# Patient Record
Sex: Female | Born: 1960 | Race: White | Hispanic: No | Smoking: Never smoker
Health system: Southern US, Community
[De-identification: ages and names within clinical notes are randomized; demographics above are authoritative.]

## PROBLEM LIST (undated history)

## (undated) DIAGNOSIS — E669 Obesity, unspecified: Secondary | ICD-10-CM

## (undated) DIAGNOSIS — R51 Headache: Secondary | ICD-10-CM

## (undated) DIAGNOSIS — I2510 Atherosclerotic heart disease of native coronary artery without angina pectoris: Secondary | ICD-10-CM

## (undated) DIAGNOSIS — E039 Hypothyroidism, unspecified: Secondary | ICD-10-CM

## (undated) DIAGNOSIS — G629 Polyneuropathy, unspecified: Secondary | ICD-10-CM

## (undated) DIAGNOSIS — Z9989 Dependence on other enabling machines and devices: Principal | ICD-10-CM

## (undated) DIAGNOSIS — I8393 Asymptomatic varicose veins of bilateral lower extremities: Secondary | ICD-10-CM

## (undated) DIAGNOSIS — I10 Essential (primary) hypertension: Secondary | ICD-10-CM

## (undated) DIAGNOSIS — Z8679 Personal history of other diseases of the circulatory system: Secondary | ICD-10-CM

## (undated) DIAGNOSIS — R519 Headache, unspecified: Secondary | ICD-10-CM

## (undated) DIAGNOSIS — G8929 Other chronic pain: Secondary | ICD-10-CM

## (undated) DIAGNOSIS — G4733 Obstructive sleep apnea (adult) (pediatric): Secondary | ICD-10-CM

## (undated) DIAGNOSIS — D649 Anemia, unspecified: Secondary | ICD-10-CM

## (undated) DIAGNOSIS — J80 Acute respiratory distress syndrome: Secondary | ICD-10-CM

## (undated) DIAGNOSIS — G4730 Sleep apnea, unspecified: Secondary | ICD-10-CM

## (undated) DIAGNOSIS — H3500 Unspecified background retinopathy: Secondary | ICD-10-CM

## (undated) DIAGNOSIS — E119 Type 2 diabetes mellitus without complications: Secondary | ICD-10-CM

## (undated) DIAGNOSIS — G709 Myoneural disorder, unspecified: Secondary | ICD-10-CM

## (undated) DIAGNOSIS — N289 Disorder of kidney and ureter, unspecified: Secondary | ICD-10-CM

## (undated) DIAGNOSIS — Z9289 Personal history of other medical treatment: Secondary | ICD-10-CM

## (undated) DIAGNOSIS — I671 Cerebral aneurysm, nonruptured: Secondary | ICD-10-CM

## (undated) HISTORY — DX: Acute respiratory distress syndrome: J80

## (undated) HISTORY — DX: Asymptomatic varicose veins of bilateral lower extremities: I83.93

## (undated) HISTORY — DX: Sleep apnea, unspecified: G47.30

## (undated) HISTORY — DX: Essential (primary) hypertension: I10

## (undated) HISTORY — DX: Hypothyroidism, unspecified: E03.9

## (undated) HISTORY — DX: Dependence on other enabling machines and devices: Z99.89

## (undated) HISTORY — PX: SPINAL CORD STIMULATOR IMPLANT: SHX2422

## (undated) HISTORY — DX: Headache: R51

## (undated) HISTORY — DX: Cerebral aneurysm, nonruptured: I67.1

## (undated) HISTORY — DX: Obstructive sleep apnea (adult) (pediatric): G47.33

## (undated) HISTORY — PX: BREAST EXCISIONAL BIOPSY: SUR124

## (undated) HISTORY — PX: FOOT SURGERY: SHX648

## (undated) HISTORY — DX: Type 2 diabetes mellitus without complications: E11.9

## (undated) HISTORY — PX: REPLACEMENT TOTAL KNEE: SUR1224

## (undated) HISTORY — DX: Atherosclerotic heart disease of native coronary artery without angina pectoris: I25.10

## (undated) HISTORY — DX: Headache, unspecified: R51.9

## (undated) HISTORY — DX: Unspecified background retinopathy: H35.00

## (undated) HISTORY — DX: Polyneuropathy, unspecified: G62.9

## (undated) HISTORY — DX: Obesity, unspecified: E66.9

## (undated) HISTORY — PX: LEG SURGERY: SHX1003

---

## 1992-07-31 HISTORY — PX: BREAST SURGERY: SHX581

## 2014-05-18 ENCOUNTER — Encounter: Payer: Medicare Other | Attending: "Endocrinology | Admitting: Nutrition

## 2014-05-18 ENCOUNTER — Encounter: Payer: Self-pay | Admitting: Nutrition

## 2014-05-18 VITALS — Ht 69.0 in

## 2014-05-18 DIAGNOSIS — E108 Type 1 diabetes mellitus with unspecified complications: Secondary | ICD-10-CM | POA: Insufficient documentation

## 2014-05-18 NOTE — Progress Notes (Signed)
  Medical Nutrition Therapy:  Appt start time: 0800 end time:  0830.   Assessment:  Primary concerns today: Type 1 Diabetes.  She states she only wants to know how many calories she is suppose to eat a day and how many carbs per meal. Refused to be weighed today. Doesn't want any further education.    Has had Type 1 for 32+ years. Most recent A1C was 8.8%. Lives with boyfriend. She does own shopping and cooking.Foods are baked, grilled or broiled. Limited physical activity due to Charcots foot and her back problems. Afraid of water and can't do water aerobics. Has recently moved here with her boyfriend from [place].  She notes she takes 8 units of Humalog at breakfast, 10 units lunch and 12 units at dinner. 43 units of Levemir at night.  FBS: 112 mg/dl this am. .   Hasn't lost any weight since she moved here to [place] three months ago.  Had A1C 13% back in December in 2014 in [place].  Preferred Learning Style:   Visual  Auditory  Learning Readiness:   Contemplating  Ready   MEDICATIONS:    DIETARY INTAKE:  24-hr recall:  B ( AM): Oatmeal, Poached egg or scrambled whites with toast and fruit  Coffee with equal. Snk ( AM): none  L ( PM): Toss salad with canned salmon or tunafish with dressing, crackers and fruit Snk ( PM): none D ( PM): 1 slice cheese pizza, muscardine grapes 10-15, 6 oz 2% milk. Snk ( PM):  Beverages: water and 2% milk  Usual physical activity: limited  Estimated energy needs: 1200 calories 135 g carbohydrates 90 g protein 33 g fat  Progress Towards Goal(s):  In progress.   Nutritional Diagnosis:  NB-1.1 Food and nutrition-related knowledge deficit As related to diabetes.  As evidenced by A1C 8.8%..    Intervention:  Nutrition counseling and diabetes education.. Plan:  Aim for 2-3 Carb Choices per meal (30-45 grams) +/- 1 either way  Avoid snacks between meal unless a low blood sugar. Include protein in moderation with your meals and  snacks Continue taking medications as directed by MD.  Teaching Method Utilized:  Auditory Visual  Handouts given during visit include:  Refused any education handouts  Barriers to learning/adherence to lifestyle change:   Demonstrated degree of understanding via:  Teach Back   Monitoring/Evaluation:  Dietary intake, exercise, meal planning,, and body weight prn.

## 2014-05-18 NOTE — Patient Instructions (Signed)
Plan:  Aim for 2-3 Carb Choices per meal (30-45 grams) +/- 1 either way  No snacking between meals unless a low blood sugar. Include protein in moderation with your meals and snacks Continue checking BG at alternate times per day as directed by MD  Continue taking medication as directed by MD

## 2014-08-03 ENCOUNTER — Encounter: Payer: Self-pay | Admitting: Neurology

## 2014-08-03 ENCOUNTER — Ambulatory Visit (INDEPENDENT_AMBULATORY_CARE_PROVIDER_SITE_OTHER): Payer: Medicare Other | Admitting: Neurology

## 2014-08-03 VITALS — BP 118/70 | HR 78 | Temp 98.0°F | Resp 18 | Ht 69.0 in | Wt 258.2 lb

## 2014-08-03 DIAGNOSIS — G4485 Primary stabbing headache: Secondary | ICD-10-CM

## 2014-08-03 MED ORDER — TOPIRAMATE 50 MG PO TABS: ORAL_TABLET | ORAL | Status: DC

## 2014-08-03 NOTE — Patient Instructions (Signed)
1.  Start topamax 50mg  tablet.  Take 1/2 tablet at bedtime for 7 days, then 1tablet at bedtime.  Call in 4 weeks with update and we can increase dose if needed. 2.  Stop verapamil 3. I would take the melatonin 10mg  if tolerating 4.  We will get results of prior CTA of the head and labs from your endocrinologist 5.  Follow up in 2 months.

## 2014-08-03 NOTE — Progress Notes (Signed)
NEUROLOGY CONSULTATION NOTE  Ariceli Stobaugh MRN: [id number] DOB: [birth year]  Referring provider: Dr. Manuella Ghazi Primary care provider: Dr. Manuella Ghazi  Reason for consult:  headache  HISTORY OF PRESENT ILLNESS: Jodi Ray is a 54 year old right-handed woman with chronic pain related to complex regional pain syndrome (with spinal stimulator), hypertension, type I diabetes mellitus with neuropathy, hypothyroidism, polymyalgia rheumatica, and morbid obesity who presents for headache.  Records and MRI reports reviewed.  Onset:  2011.  She was previously treated in [place], where she was diagnosed with primary stabbing headache. Location:  Right frontal region Quality:  stabbing Intensity:  10/10 Aura:  no Prodrome:  no Associated symptoms:  Some nausea if severe. No autonomic symptoms. Duration:  10 to 60 minutes Frequency:  4 times a week Triggers/exacerbating factors:  none Relieving factors:  none Activity:  Cannot function when experiencing it.  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  However, a repeat CTA performed on 02/22/12 did not reveal any aneurysm.  She reportedly had a follow up CTA in February 2015, which is not available to me.  She also has complex regional pain syndrome following an accident where she fell down the steps and crushed her right leg.  She takes gabapentin, tramadol and has a spinal nerve stimulator.  She takes  this for her painful diabetic neuropathy as well.  PAST MEDICAL HISTORY: Past Medical History  Diagnosis Date  . Diabetes mellitus without complication   . Hypothyroidism   . Obesity   . Adult RDS   . Retinopathy   . Headache     PAST SURGICAL HISTORY: Past Surgical History  Procedure Laterality Date  . Foot surgery    . Spinal cord stimulator implant    . Leg surgery Right     MEDICATIONS: Current Outpatient Prescriptions on File Prior to Visit  Medication Sig Dispense Refill  . aspirin EC 81 MG tablet Take 81 mg by mouth daily.    . baclofen (LIORESAL) 10 MG tablet Take 10 mg by mouth 3 (three) times daily.    . cholecalciferol (VITAMIN D) 400 UNITS TABS tablet Take 400 Units by mouth.    . gabapentin (NEURONTIN) 800 MG tablet Take 800 mg by mouth 3 (three) times daily.    . hydrochlorothiazide (MICROZIDE) 12.5 MG capsule Take 12.5 mg by mouth daily.    . hyoscyamine (LEVSIN) 0.125 MG/5ML ELIX Take 0.125 mg by mouth.    . indomethacin (INDOCIN) 25 MG capsule Take 25 mg by mouth 2 (two) times daily with a meal.    . insulin aspart (NOVOLOG) 100 UNIT/ML injection Inject into the skin 3 (three) times daily before meals.    . insulin detemir (LEVEMIR) 100 UNIT/ML injection Inject 43 Units into the skin at bedtime.     Marland Kitchen levothyroxine (SYNTHROID, LEVOTHROID) 88 MCG tablet Take 88 mcg by mouth daily before breakfast.    . lisinopril (PRINIVIL,ZESTRIL) 10 MG tablet Take 10 mg by mouth daily.    Marland Kitchen loratadine (CLARITIN)  10 MG tablet Take 10 mg by mouth daily.    Marland Kitchen omeprazole (PRILOSEC) 40 MG capsule Take 40 mg by mouth daily.    . traMADol (ULTRAM-ER) 100 MG 24 hr tablet Take 100 mg by mouth daily.    . triazolam (HALCION) 0.25 MG tablet Take 0.25 mg by mouth at bedtime as needed for sleep.    . metFORMIN (GLUCOPHAGE) 1000 MG tablet Take 1,000 mg by mouth 2 (two) times daily with a meal.     No current facility-administered medications on file prior to visit.     ALLERGIES: Allergies  Allergen Reactions  . Clonidine Derivatives     Coma    FAMILY HISTORY: Family History  Problem Relation Age of Onset  . Cancer Mother     colon  . Cancer Father     renal cell  . Cancer Brother     bladder  . Cancer Sister     brain   . Cancer Sister     breast     SOCIAL HISTORY: History   Social History  . Marital Status: Unknown    Spouse Name: N/A    Number of Children: N/A  . Years of Education: N/A   Occupational History  . Not on file.   Social History Main Topics  . Smoking status: Never Smoker   . Smokeless tobacco: Never Used  . Alcohol Use: No  . Drug Use: No  . Sexual Activity:    Partners: Male   Other Topics Concern  . Not on file   Social History Narrative    REVIEW OF SYSTEMS: Constitutional: No fevers, chills, or sweats, no generalized fatigue, change in appetite Eyes: No visual changes, double vision, eye pain Ear, nose and throat: No hearing loss, ear pain, nasal congestion, sore throat Cardiovascular: No chest pain, palpitations Respiratory:  No shortness of breath at rest or with exertion, wheezes GastrointestinaI: No nausea, vomiting, diarrhea, abdominal pain, fecal incontinence Genitourinary:  No dysuria, urinary retention or frequency Musculoskeletal:  No neck pain, back pain Integumentary: No rash, pruritus, skin lesions Neurological: as above Psychiatric: No depression, insomnia, anxiety Endocrine: No palpitations, fatigue, diaphoresis, mood swings, change in appetite, change in weight, increased thirst Hematologic/Lymphatic:  No anemia, purpura, petechiae. Allergic/Immunologic: no itchy/runny eyes, nasal congestion, recent allergic reactions, rashes  PHYSICAL EXAM: Filed Vitals:   08/03/14 0920  BP: 118/70  Pulse: 78  Temp: 98 F (36.7 C)  Resp: 18   General: No acute distress Head:  Normocephalic/atraumatic Eyes:  fundi unremarkable, without vessel changes, exudates, hemorrhages or  papilledema. Neck: supple, no paraspinal tenderness, full range of motion Back: No paraspinal tenderness Heart: regular rate and rhythm Lungs: Clear to auscultation bilaterally. Vascular: No carotid bruits. Neurological Exam: Mental status: alert and oriented to person, place, and time, recent and remote memory intact, fund of knowledge intact, attention and concentration intact, speech fluent and not dysarthric, language intact. Cranial nerves: CN I: not tested CN II: pupils equal, round and reactive to light, visual fields intact, fundi unremarkable, without vessel changes, exudates, hemorrhages or papilledema. CN III, IV, VI:  full range of motion, no nystagmus, no ptosis CN V: facial sensation intact CN VII: upper and lower face symmetric CN VIII: hearing intact CN IX, X: gag intact, uvula midline CN XI: sternocleidomastoid and trapezius muscles intact CN XII: tongue midline Bulk & Tone: normal, no fasciculations. Motor:  Right ankle dorsiflexion 4-/5.  Right knee extension and flexion at least 4+/5 (limited due to pain).  Otherwise  5/5 throughout Sensation:  Reduced pinprick in right foot and shin.  Reduced vibration in toes bilaterally. Deep Tendon Reflexes:  2+ throughout except absent in ankles.  Toes downgoing. Finger to nose testing:  No dysmetria Heel to shin:  No dysmetria Gait:  Right limp. Romberg negative.  IMPRESSION: Primary stabbing headache  PLAN: 1.  Will start topamax 25mg  and titrate to 50mg  at bedtime and discontinue verapamil. 2.  Recommend taking melatonin 10mg  every night if tolerating. 3.  Get results of prior CTA from last year, as well as recent labs 4.  Follow up in 2 months.  Thank you for allowing me to take part in the care of this patient.  Metta Clines, DO  CC: Monico Blitz, MD

## 2014-08-03 NOTE — Progress Notes (Deleted)
NEUROLOGY CONSULTATION NOTE  Jodi Ray MRN: [id number] DOB: ***  Referring provider: *** Primary care provider: ***  Reason for consult:  ***  HISTORY OF PRESENT ILLNESS: Jodi Ray is a 54 year old ***-handed woman with chronic pain related to spinal cord injury (with spinal stimulator), hypertension, type II diabetes mellitus with neuropathy, hypothyroidism and morbid obesity who presents for headache.  Records reviewed.  ***.  She takes gabapentin   Onset:  *** Location:  *** Quality:  *** Intensity:  *** Aura:  *** Prodrome:  *** Associated symptoms:  *** Duration:  *** Frequency:  *** Triggers/exacerbating factors:  *** Relieving factors:  *** Activity:  ***  Past abortive therapy:  *** Past preventative therapy:  ***  Current abortive therapy:  *** Current preventative therapy:  *** Other medications (for chronic pain and diabetic neuropathy):  gabapentin 300mg  three times daily, tramadol 50-100mg , baclofen Other medication (for BP):  verapamil 80mg  three times daily  Caffeine:  *** Alcohol:  *** Smoker:  *** Diet:  *** Exercise:  *** Depression/stress:  *** Sleep hygiene:  *** Family history of headache:  ***  PAST MEDICAL HISTORY: Past Medical History  Diagnosis Date  . Diabetes mellitus without complication   . Hypothyroidism   . Obesity   . Adult RDS   . Retinopathy   . Headache     PAST SURGICAL HISTORY: Past Surgical History  Procedure Laterality Date  . Foot surgery    . Spinal cord stimulator implant    . Leg surgery Right     MEDICATIONS: Current Outpatient Prescriptions on File Prior to Visit  Medication Sig Dispense Refill  . aspirin EC 81 MG tablet Take 81 mg by mouth daily.    . baclofen (LIORESAL) 10 MG tablet Take 10 mg by mouth 3 (three) times daily.    . cholecalciferol (VITAMIN D) 400 UNITS TABS tablet Take 400 Units by mouth.    . gabapentin (NEURONTIN) 800 MG tablet Take 800 mg by mouth 3 (three) times  daily.    . hydrochlorothiazide (MICROZIDE) 12.5 MG capsule Take 12.5 mg by mouth daily.    . hyoscyamine (LEVSIN) 0.125 MG/5ML ELIX Take 0.125 mg by mouth.    . indomethacin (INDOCIN) 25 MG capsule Take 25 mg by mouth 2 (two) times daily with a meal.    . insulin aspart (NOVOLOG) 100 UNIT/ML injection Inject into the skin 3 (three) times daily before meals.    . insulin detemir (LEVEMIR) 100 UNIT/ML injection Inject 43 Units into the skin at bedtime.     Marland Kitchen levothyroxine (SYNTHROID, LEVOTHROID) 88 MCG tablet Take 88 mcg by mouth daily before breakfast.    . lisinopril (PRINIVIL,ZESTRIL) 10 MG tablet Take 10 mg by mouth daily.    Marland Kitchen loratadine (CLARITIN) 10 MG tablet Take 10 mg by mouth daily.    Marland Kitchen omeprazole (PRILOSEC) 40 MG capsule Take 40 mg by mouth daily.    . traMADol (ULTRAM-ER) 100 MG 24 hr tablet Take 100 mg by mouth daily.    . triazolam (HALCION) 0.25 MG tablet Take 0.25 mg by mouth at bedtime as needed for sleep.    . metFORMIN (GLUCOPHAGE) 1000 MG tablet Take 1,000 mg by mouth 2 (two) times daily with a meal.     No current facility-administered medications on file prior to visit.    ALLERGIES: Allergies  Allergen Reactions  . Clonidine Derivatives     Coma    FAMILY HISTORY: Family History  Problem Relation Age of Onset  .  Cancer Mother     colon  . Cancer Father     renal cell  . Cancer Brother     bladder  . Cancer Sister     brain   . Cancer Sister     breast     SOCIAL HISTORY: History   Social History  . Marital Status: Unknown    Spouse Name: N/A    Number of Children: N/A  . Years of Education: N/A   Occupational History  . Not on file.   Social History Main Topics  . Smoking status: Never Smoker   . Smokeless tobacco: Never Used  . Alcohol Use: No  . Drug Use: No  . Sexual Activity:    Partners: Male   Other Topics Concern  . Not on file   Social History Narrative    REVIEW OF SYSTEMS: Constitutional: No fevers, chills, or sweats,  no generalized fatigue, change in appetite Eyes: No visual changes, double vision, eye pain Ear, nose and throat: No hearing loss, ear pain, nasal congestion, sore throat Cardiovascular: No chest pain, palpitations Respiratory:  No shortness of breath at rest or with exertion, wheezes GastrointestinaI: No nausea, vomiting, diarrhea, abdominal pain, fecal incontinence Genitourinary:  No dysuria, urinary retention or frequency Musculoskeletal:  No neck pain, back pain Integumentary: No rash, pruritus, skin lesions Neurological: as above Psychiatric: No depression, insomnia, anxiety Endocrine: No palpitations, fatigue, diaphoresis, mood swings, change in appetite, change in weight, increased thirst Hematologic/Lymphatic:  No anemia, purpura, petechiae. Allergic/Immunologic: no itchy/runny eyes, nasal congestion, recent allergic reactions, rashes  PHYSICAL EXAM: Filed Vitals:   08/03/14 0920  BP: 118/70  Pulse: 78  Temp: 98 F (36.7 C)  Resp: 18   General: No acute distress Head:  Normocephalic/atraumatic Eyes:  fundi unremarkable, without vessel changes, exudates, hemorrhages or papilledema. Neck: supple, no paraspinal tenderness, full range of motion Back: No paraspinal tenderness Heart: regular rate and rhythm Lungs: Clear to auscultation bilaterally. Vascular: No carotid bruits. Neurological Exam: Mental status: alert and oriented to person, place, and time, recent and remote memory intact, fund of knowledge intact, attention and concentration intact, speech fluent and not dysarthric, language intact. Cranial nerves: CN I: not tested CN II: pupils equal, round and reactive to light, visual fields intact, fundi unremarkable, without vessel changes, exudates, hemorrhages or papilledema. CN III, IV, VI:  full range of motion, no nystagmus, no ptosis CN V: facial sensation intact CN VII: upper and lower face symmetric CN VIII: hearing intact CN IX, X: gag intact, uvula  midline CN XI: sternocleidomastoid and trapezius muscles intact CN XII: tongue midline Bulk & Tone: normal, no fasciculations. Motor:  *** Sensation:  *** Deep Tendon Reflexes:  *** Finger to nose testing:  *** Heel to shin:  *** Gait:  ***. Romberg ***.  IMPRESSION: ***  PLAN: ***  Thank you for allowing me to take part in the care of this patient.  Metta Clines, DO  CC: ***

## 2014-09-07 ENCOUNTER — Other Ambulatory Visit: Payer: Self-pay | Admitting: Neurology

## 2014-10-02 ENCOUNTER — Other Ambulatory Visit: Payer: Self-pay | Admitting: *Deleted

## 2014-10-02 ENCOUNTER — Ambulatory Visit (INDEPENDENT_AMBULATORY_CARE_PROVIDER_SITE_OTHER): Payer: Medicare Other | Admitting: Neurology

## 2014-10-02 ENCOUNTER — Encounter: Payer: Self-pay | Admitting: Neurology

## 2014-10-02 VITALS — BP 138/62 | HR 80 | Temp 98.0°F | Resp 16 | Ht 69.0 in | Wt 249.9 lb

## 2014-10-02 DIAGNOSIS — G43009 Migraine without aura, not intractable, without status migrainosus: Secondary | ICD-10-CM

## 2014-10-02 DIAGNOSIS — G4485 Primary stabbing headache: Secondary | ICD-10-CM | POA: Insufficient documentation

## 2014-10-02 MED ORDER — TOPIRAMATE 50 MG PO TABS: 50.0000 mg | ORAL_TABLET | Freq: Every day | ORAL | Status: DC

## 2014-10-02 NOTE — Progress Notes (Signed)
NEUROLOGY FOLLOW UP OFFICE NOTE  Jodi Soliday [id number]  HISTORY OF PRESENT ILLNESS: Jodi Ray is a 54 year old right-handed woman with chronic pain related to complex regional pain syndrome (with spinal stimulator), hypertension, type I diabetes mellitus with neuropathy, hypothyroidism, polymyalgia rheumatica, and morbid obesity who follows up for primary stabbing headache.  UPDATE: Current preventative therapy:  Topiramate 50mg , Melatonin 10mg  nightly The stabbing headaches have been well controlled on this regimen.  Due to change in weather, she has been having what sound like mild migraine headaches for the past 3 days.  They are bi-frontal/maxillary and associated with slight nausea.  She has had this before when her allergies "act up."  She has been taking Tylenol daily.   HISTORY: Onset:  2011.  She was previously treated in [place], where she was diagnosed with primary stabbing headache. Location:  Right frontal region Quality:  stabbing Intensity:  10/10 Aura:  no Prodrome:  no Associated symptoms:  Some nausea if severe. No autonomic symptoms. Duration:  10 to 60 minutes Frequency:  4 times a week Triggers/exacerbating factors:  none Relieving factors:  none Activity:  Cannot function when experiencing it.  Past preventative therapy:  Indomethacin 25mg  three times daily (effective but discontinued due to elevated liver enzymes), Verapamil (ineffective)  In 2009, she had a CTA of the head which reportedly showed a 1-30mm aneurysm at the junction of right A1 and A2 segment of the ACA.  However, a repeat CTA performed on 02/22/12 did not reveal any aneurysm.  She reportedly had a follow up CTA in February 2015, which is not available to me.  She also has complex regional pain syndrome following an accident where she fell down the steps and crushed her right leg.  She takes gabapentin, tramadol and has a spinal nerve stimulator.  She takes this for her painful  diabetic neuropathy as well.  PAST MEDICAL HISTORY: Past Medical History  Diagnosis Date  . Diabetes mellitus without complication   . Hypothyroidism   . Obesity   . Adult RDS   . Retinopathy   . Headache     MEDICATIONS: Current Outpatient Prescriptions on File Prior to Visit  Medication Sig Dispense Refill  . aspirin EC 81 MG tablet Take 81 mg by mouth daily.    . baclofen (LIORESAL) 10 MG tablet Take 10 mg by mouth 3 (three) times daily.    . cholecalciferol (VITAMIN D) 400 UNITS TABS tablet Take 400 Units by mouth.    . gabapentin (NEURONTIN) 800 MG tablet Take 800 mg by mouth 3 (three) times daily.    . hydrochlorothiazide (MICROZIDE) 12.5 MG capsule Take 12.5 mg by mouth daily.    . hyoscyamine (LEVSIN) 0.125 MG/5ML ELIX Take 0.125 mg by mouth.    . indomethacin (INDOCIN) 25 MG capsule Take 25 mg by mouth 2 (two) times daily with a meal.    . insulin aspart (NOVOLOG) 100 UNIT/ML injection Inject into the skin 3 (three) times daily before meals.    Marland Kitchen levothyroxine (SYNTHROID, LEVOTHROID) 88 MCG tablet Take 88 mcg by mouth daily before breakfast.    . lisinopril (PRINIVIL,ZESTRIL) 10 MG tablet Take 10 mg by mouth daily.    Marland Kitchen loratadine (CLARITIN) 10 MG tablet Take 10 mg by mouth daily.    Marland Kitchen omeprazole (PRILOSEC) 40 MG capsule Take 40 mg by mouth daily.    Marland Kitchen topiramate (TOPAMAX) 50 MG tablet TAKE 1/2 TABLET AT BEDTIME FOR 7 DAYS THEN TAKE 1 TABLET AT BEDTIME  30 tablet 0  . traMADol (ULTRAM-ER) 100 MG 24 hr tablet Take 100 mg by mouth daily.    . triazolam (HALCION) 0.25 MG tablet Take 0.25 mg by mouth at bedtime as needed for sleep.    . insulin detemir (LEVEMIR) 100 UNIT/ML injection Inject 43 Units into the skin at bedtime.     . metFORMIN (GLUCOPHAGE) 1000 MG tablet Take 1,000 mg by mouth 2 (two) times daily with a meal.    . verapamil (CALAN) 80 MG tablet Take 80 mg by mouth once. BID if needed     No current facility-administered medications on file prior to visit.     ALLERGIES: Allergies  Allergen Reactions  . Clonidine Derivatives     Coma    FAMILY HISTORY: Family History  Problem Relation Age of Onset  . Cancer Mother     colon  . Cancer Father     renal cell  . Cancer Brother     bladder  . Cancer Sister     brain   . Cancer Sister     breast     SOCIAL HISTORY: History   Social History  . Marital Status: Unknown    Spouse Name: N/A  . Number of Children: N/A  . Years of Education: N/A   Occupational History  . Not on file.   Social History Main Topics  . Smoking status: Never Smoker   . Smokeless tobacco: Never Used  . Alcohol Use: No  . Drug Use: No  . Sexual Activity:    Partners: Male   Other Topics Concern  . Not on file   Social History Narrative    REVIEW OF SYSTEMS: Constitutional: No fevers, chills, or sweats, no generalized fatigue, change in appetite Eyes: No visual changes, double vision, eye pain Ear, nose and throat: No hearing loss, ear pain, nasal congestion, sore throat Cardiovascular: No chest pain, palpitations Respiratory:  No shortness of breath at rest or with exertion, wheezes GastrointestinaI: No nausea, vomiting, diarrhea, abdominal pain, fecal incontinence Genitourinary:  No dysuria, urinary retention or frequency Musculoskeletal:  No neck pain, back pain Integumentary: No rash, pruritus, skin lesions Neurological: as above Psychiatric: No depression, insomnia, anxiety Endocrine: No palpitations, fatigue, diaphoresis, mood swings, change in appetite, change in weight, increased thirst Hematologic/Lymphatic:  No anemia, purpura, petechiae. Allergic/Immunologic: no itchy/runny eyes, nasal congestion, recent allergic reactions, rashes  PHYSICAL EXAM: Filed Vitals:   10/02/14 0942  BP: 138/62  Pulse: 80  Temp: 98 F (36.7 C)  Resp: 16   General: No acute distress Head:  Normocephalic/atraumatic Eyes:  Fundoscopic exam unremarkable without vessel changes, exudates,  hemorrhages or papilledema. Neck: supple, no paraspinal tenderness, full range of motion Heart:  Regular rate and rhythm Lungs:  Clear to auscultation bilaterally Back: No paraspinal tenderness Neurological Exam: alert and oriented to person, place, and time. Attention span and concentration intact, recent and remote memory intact, fund of knowledge intact.  Speech fluent and not dysarthric, language intact.  CN II-XII intact. Fundoscopic exam unremarkable without vessel changes, exudates, hemorrhages or papilledema.  Bulk and tone normal, muscle strength 5/5 throughout.  Sensation to light touch, temperature and vibration intact.  Deep tendon reflexes 1+ throughout except absent in ankles.  Finger to nose testing intact.  Gait with right limp.  IMPRESSION: Primary stabbing headache, stable Migraine without aura  PLAN: 1. Continue topamax 50mg  and melatonin 10mg  daily 2.  Will monitor headache and take tylenol sparingly 3.  Follow up in 3 months.  Metta Clines, DO  CC:  Monico Blitz, MD

## 2014-10-02 NOTE — Patient Instructions (Signed)
Continue Topamax 50mg  Take Tylenol for the current headache but limit to no more than 2 days out of the week Follow up in 3 months.  Call sooner with questions or concerns.

## 2015-01-03 ENCOUNTER — Other Ambulatory Visit: Payer: Self-pay | Admitting: Neurology

## 2015-01-04 NOTE — Telephone Encounter (Signed)
Topamax refill requested. Per last office note- patient to remain on medication. Refill approved and sent to patient's pharmacy.

## 2015-01-08 ENCOUNTER — Encounter: Payer: Self-pay | Admitting: Neurology

## 2015-01-08 ENCOUNTER — Ambulatory Visit (INDEPENDENT_AMBULATORY_CARE_PROVIDER_SITE_OTHER): Payer: Medicare Other | Admitting: Neurology

## 2015-01-08 VITALS — BP 120/60 | HR 87 | Ht 67.0 in | Wt 253.0 lb

## 2015-01-08 DIAGNOSIS — G43009 Migraine without aura, not intractable, without status migrainosus: Secondary | ICD-10-CM | POA: Diagnosis not present

## 2015-01-08 DIAGNOSIS — G4485 Primary stabbing headache: Secondary | ICD-10-CM | POA: Diagnosis not present

## 2015-01-08 MED ORDER — TOPIRAMATE 50 MG PO TABS
50.0000 mg | ORAL_TABLET | Freq: Every day | ORAL | Status: DC
Start: 2015-01-08 — End: 2016-01-12

## 2015-01-08 NOTE — Progress Notes (Addendum)
NEUROLOGY FOLLOW UP OFFICE NOTE  Kaprice Mulrooney RN:[id number]  HISTORY OF PRESENT ILLNESS: Jodi Ray is a 54 year old right-handed woman with chronic pain related to complex regional pain syndrome (with spinal stimulator), hypertension, type I diabetes mellitus with neuropathy, hypothyroidism, polymyalgia rheumatica, and morbid obesity who follows up for primary stabbing headache.  UPDATE: Current preventative therapy:  Topiramate 50mg , Melatonin 10mg  nightly Stabbing headaches have resolved.  Migraines are manageable and only occur once a week.  She has a cervical radiculopathy and was getting PT on the neck which exacerbated headache, but they stopped once she stopped the neck exercises.  HISTORY: Onset:  2011.  She was previously treated in [place], where she was diagnosed with primary stabbing headache. Location:  Right frontal region Quality:  stabbing Intensity:  10/10 Aura:  no Prodrome:  no Associated symptoms:  Some nausea if severe. No autonomic symptoms. Duration:  10 to 60 minutes Frequency:  4 times a week Triggers/exacerbating factors:  none Relieving factors:  none Activity:  Cannot function when experiencing it.  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  However, a repeat CTA performed on 02/22/12 did not reveal any aneurysm.  She reportedly had a follow up CTA in February 2015, which is not available to me.  She also has migraine headaches.  They are bi-frontal/maxillary and associated with slight nausea.  She has had this before when her allergies "act up."    She also has complex regional pain syndrome following an accident where she fell down the steps and crushed her right leg.  She takes gabapentin, tramadol and has  a spinal nerve stimulator.  She takes this for her painful diabetic neuropathy as well.  PAST MEDICAL HISTORY: Past Medical History  Diagnosis Date  . Diabetes mellitus without complication   . Hypothyroidism   . Obesity   . Adult RDS   . Retinopathy   . Headache     MEDICATIONS: Current Outpatient Prescriptions on File Prior to Visit  Medication Sig Dispense Refill  . aspirin EC 81 MG tablet Take 81 mg by mouth daily.    . baclofen (LIORESAL) 10 MG tablet Take 10 mg by mouth 3 (three) times daily.    . cholecalciferol (VITAMIN D) 400 UNITS TABS tablet Take 400 Units by mouth.    . gabapentin (NEURONTIN) 800 MG tablet Take 800 mg by mouth 3 (three) times daily.    . hydrochlorothiazide (MICROZIDE) 12.5 MG capsule Take 12.5 mg by mouth daily.    . hyoscyamine (LEVSIN) 0.125 MG/5ML ELIX Take 0.125 mg by mouth.    . indomethacin (INDOCIN) 25 MG capsule Take 25 mg by mouth 2 (two) times daily with a meal.    . insulin aspart (NOVOLOG) 100 UNIT/ML injection Inject into the skin 3 (three) times daily before meals.    . insulin detemir (LEVEMIR) 100 UNIT/ML injection Inject 43 Units into the skin at bedtime.     . insulin glargine (LANTUS) 100 UNIT/ML injection Inject into the skin at bedtime.    Marland Kitchen levothyroxine (SYNTHROID, LEVOTHROID) 88 MCG tablet Take 88 mcg by mouth daily before breakfast.    . lisinopril (PRINIVIL,ZESTRIL) 10 MG tablet Take 10 mg by mouth daily.    Marland Kitchen loratadine (CLARITIN) 10 MG tablet Take  10 mg by mouth daily.    . metFORMIN (GLUCOPHAGE) 1000 MG tablet Take 1,000 mg by mouth 2 (two) times daily with a meal.    . omeprazole (PRILOSEC) 40 MG capsule Take 40 mg by mouth daily.    . traMADol (ULTRAM-ER) 100 MG 24 hr tablet Take 100 mg by mouth daily.    . triazolam (HALCION) 0.25 MG tablet Take 0.25 mg by mouth at bedtime as needed for sleep.    . verapamil (CALAN) 80 MG tablet Take 80 mg by mouth once. BID if needed     No current facility-administered medications on  file prior to visit.    ALLERGIES: Allergies  Allergen Reactions  . Clonidine Derivatives     Coma    FAMILY HISTORY: Family History  Problem Relation Age of Onset  . Cancer Mother     colon  . Cancer Father     renal cell  . Cancer Brother     bladder  . Cancer Sister     brain   . Cancer Sister     breast     SOCIAL HISTORY: History   Social History  . Marital Status: Unknown    Spouse Name: N/A  . Number of Children: N/A  . Years of Education: N/A   Occupational History  . Not on file.   Social History Main Topics  . Smoking status: Never Smoker   . Smokeless tobacco: Never Used  . Alcohol Use: No  . Drug Use: No  . Sexual Activity:    Partners: Male   Other Topics Concern  . Not on file   Social History Narrative    REVIEW OF SYSTEMS: Constitutional: No fevers, chills, or sweats, no generalized fatigue, change in appetite Eyes: No visual changes, double vision, eye pain Ear, nose and throat: No hearing loss, ear pain, nasal congestion, sore throat Cardiovascular: No chest pain, palpitations Respiratory:  No shortness of breath at rest or with exertion, wheezes GastrointestinaI: No nausea, vomiting, diarrhea, abdominal pain, fecal incontinence Genitourinary:  No dysuria, urinary retention or frequency Musculoskeletal:  Neck pain, back pain Integumentary: No rash, pruritus, skin lesions Neurological: as above Psychiatric: No depression, insomnia, anxiety Endocrine: No palpitations, fatigue, diaphoresis, mood swings, change in appetite, change in weight, increased thirst Hematologic/Lymphatic:  No anemia, purpura, petechiae. Allergic/Immunologic: no itchy/runny eyes, nasal congestion, recent allergic reactions, rashes  PHYSICAL EXAM: Filed Vitals:   01/08/15 0922  BP: 120/60  Pulse: 87   General: No acute distress Head:  Normocephalic/atraumatic Eyes:  Fundoscopic exam unremarkable without vessel changes, exudates, hemorrhages or  papilledema. Neck: supple, right sided paraspinal tenderness, reduced range of motion Heart:  Regular rate and rhythm Lungs:  Clear to auscultation bilaterally Back: No paraspinal tenderness Neurological Exam: alert and oriented to person, place, and time. Attention span and concentration intact, recent and remote memory intact, fund of knowledge intact.  Speech fluent and not dysarthric, language intact.  CN II-XII intact. Fundoscopic exam unremarkable without vessel changes, exudates, hemorrhages or papilledema.  Bulk and tone normal, decreased effort testing muscle strength in all extremities due to pain..  Reduced light touch sensation in right hand.  Deep tendon reflexes 1+ throughout except absent in ankles.  Finger to nose testing intact.  Gait with right limp.  IMPRESSION: Primary stabbing headache, stable Migraine without aura  PLAN: Topiramate 50mg  daily melotonin 10mg  at bedtime Follow up in one year or as needed.  15 minutes spent face to face with patient, over 50% spent discussing  management.  Metta Clines, DO  CC: Monico Blitz, MD

## 2015-01-08 NOTE — Patient Instructions (Signed)
1.  Continue topiramate 50mg  daily (3 month supply with 3 refills sent) and melatonin 10mg  daily 2.  Follow up in one year

## 2015-01-15 LAB — HEMOGLOBIN A1C: Hgb A1c MFr Bld: 9.6 % — AB (ref 4.0–6.0)

## 2015-01-21 ENCOUNTER — Ambulatory Visit (INDEPENDENT_AMBULATORY_CARE_PROVIDER_SITE_OTHER): Payer: Medicare Other | Admitting: Otolaryngology

## 2015-01-21 DIAGNOSIS — H7101 Cholesteatoma of attic, right ear: Secondary | ICD-10-CM

## 2015-01-21 DIAGNOSIS — H6123 Impacted cerumen, bilateral: Secondary | ICD-10-CM | POA: Diagnosis not present

## 2015-03-10 ENCOUNTER — Encounter: Payer: Self-pay | Admitting: Neurology

## 2015-03-10 ENCOUNTER — Ambulatory Visit (INDEPENDENT_AMBULATORY_CARE_PROVIDER_SITE_OTHER): Payer: Medicare Other | Admitting: Neurology

## 2015-03-10 VITALS — BP 142/72 | HR 86 | Resp 20 | Ht 67.0 in | Wt 248.0 lb

## 2015-03-10 DIAGNOSIS — G4733 Obstructive sleep apnea (adult) (pediatric): Secondary | ICD-10-CM

## 2015-03-10 DIAGNOSIS — Z9989 Dependence on other enabling machines and devices: Principal | ICD-10-CM

## 2015-03-10 HISTORY — DX: Obstructive sleep apnea (adult) (pediatric): G47.33

## 2015-03-10 NOTE — Progress Notes (Signed)
SLEEP MEDICINE CLINIC   Provider:  Larey Seat, M D  Referring Provider: Monico Blitz, MD Primary Care Physician:  Monico Blitz, MD  Chief Complaint  Patient presents with  . NP Sleep Apnea  Dr. Eber Hong, already on cpap, Dr. Redmond Pulling in [place] is not longer in [place], alone    HPI:  Jodi Ray is a 54 y.o. female , seen here as a referral from Dr. Manuella Ghazi for transfer of sleep care,  Chief complaint according to patient : I need new supplies, i don't need a sleep study.   Jodi Ray, a right handed caucasian female patient of Dr. Trena Platt, is today referred for follow-up of her sleep apnea condition. She was diagnosed more than 13 years ago with obstructive sleep apnea and had been followed by Dr. Redmond Pulling but he has left the area. She received a new CPAP machine just in December 2015 so she is not in need of any new equipment but she needs continued supplies. As she has been a Medicare patient since 2006 she will need a sleep doctor visit once a year to allow her to obtain CPAP supplies. The patient moved here from [place] in 2015 and her original sleep study was performed in [place], she was told they are no records available as they were older than 10 years. Her risk factors are clear as she has hypertension associated with diabetes, morbid obesity, hypothyroidism and diabetes. She was also diagnosed with a polyneuropathy, she has been diagnosed with an intracerebral aneurysm and was a stabbing headache or ice pick headache. She is a nonsmoker and nondrinker and she drinks only one cup of caffeine at coffee per day. Her blood pressure has been well controlled on her current medical regimen,  is almost too low.  Jodi Ray is followed by a pain management practice. She has a new general  neurologist, Metta Clines, DO , who follows her headaches.   I'm able to review Mrs. Jodi Ray last CPAP download, obtained on 03-09-15, just yesterday in preparation for this visit. It shows that she  is 100% compliant for 30 out of 30 days and all these days she has used her CPAP more than 4 hours, 7 hours and 52 minutes on average. Her pressure is 10.6 cm water with a full-time EPR of 2 cm water. A residual AHI is 1.4. She does have high air leaks. The prescription for the setting followed and outer titration. It seems that she is still using an AutoSet a so-called AUTO set S 10 machine by [employer].   She is feeling comfortable with the current setting is sleeping well and does not wake up with headaches, dry mouth or from snoring. She will have one bathroom break at night. She usually sleeps from 10 PM plus minus one hour, to 6 AM when her dog wakes her up. She feels refreshed and restored in the mornings and feels that overall the morning time as her best time of the day to be productive and energized. She does not nap in daytime.     Sleep medical history and family sleep history: She is the only apnea patient in her family, no history of sleep walking. Social history: non smoker, non s drinker, 1 cup of coffee, disabled. [place],   Review of Systems: Out of a complete 14 system review, the patient complains of only the following symptoms, and all other reviewed systems are negative. The patient endorsed the Epworth Sleepiness Scale at  7 points and the fatigue severity scale at 13 points- well below average. Nasal allergic rhinitis, diabetes type 1 but better controlled. Weight gain, chronic back pain she has a spinal cord stimulator 2005 she has "Charcot "-feet and had a bone surgery to the left foot 2010. She had a fracture repaired with hardware in the right leg 2003.   Social History   Social History  . Marital Status: Unknown    Spouse Name: N/A  . Number of Children: N/A  . Years of Education: N/A   Occupational History  . Not on file.   Social History Main Topics  . Smoking status: Never Smoker   . Smokeless tobacco: Never Used  . Alcohol Use: No  . Drug Use: No  .  Sexual Activity:    Partners: Male   Other Topics Concern  . Not on file   Social History Narrative   Drinks about 1-2 cups of caffeine daily.    Family History  Problem Relation Age of Onset  . Cancer Mother     colon  . Cancer Father     renal cell  . Cancer Brother     bladder  . Cancer Sister     brain   . Cancer Sister     breast     Past Medical History  Diagnosis Date  . Diabetes mellitus without complication   . Hypothyroidism   . Obesity   . Adult RDS   . Retinopathy   . Headache   . Sleep apnea   . Neuropathy   . OSA on CPAP 03/10/2015    Past Surgical History  Procedure Laterality Date  . Foot surgery    . Spinal cord stimulator implant    . Leg surgery Right     Current Outpatient Prescriptions  Medication Sig Dispense Refill  . aspirin EC 81 MG tablet Take 81 mg by mouth daily.    . baclofen (LIORESAL) 10 MG tablet Take 10 mg by mouth 3 (three) times daily.    . cholecalciferol (VITAMIN D) 400 UNITS TABS tablet Take 400 Units by mouth.    . gabapentin (NEURONTIN) 300 MG capsule   0  . gabapentin (NEURONTIN) 800 MG tablet Take 800 mg by mouth 3 (three) times daily.    . hydrochlorothiazide (MICROZIDE) 12.5 MG capsule Take 12.5 mg by mouth daily.    . indomethacin (INDOCIN) 25 MG capsule Take 25 mg by mouth 2 (two) times daily with a meal.    . insulin aspart (NOVOLOG) 100 UNIT/ML injection Inject into the skin 3 (three) times daily before meals.    . insulin detemir (LEVEMIR) 100 UNIT/ML injection Inject 43 Units into the skin at bedtime.     Jodi Ray levothyroxine (SYNTHROID, LEVOTHROID) 88 MCG tablet Take 88 mcg by mouth daily before breakfast.    . loratadine (CLARITIN) 10 MG tablet Take 10 mg by mouth daily.    . metFORMIN (GLUCOPHAGE) 1000 MG tablet Take 1,000 mg by mouth 2 (two) times daily with a meal.    . omeprazole (PRILOSEC) 40 MG capsule Take 40 mg by mouth daily.    Jodi Ray topiramate (TOPAMAX) 50 MG tablet Take 1 tablet (50 mg total) by mouth  daily. 90 tablet 3  . traMADol (ULTRAM-ER) 100 MG 24 hr tablet Take 100 mg by mouth daily.    . triazolam (HALCION) 0.25 MG tablet Take 0.25 mg by mouth at bedtime as needed for sleep.     No current facility-administered medications for  this visit.    Allergies as of 03/10/2015 - Review Complete 03/10/2015  Allergen Reaction Noted  . Clonidine derivatives  08/03/2014    Vitals: BP 142/72 mmHg  Pulse 86  Resp 20  Ht 5\' 7"  (1.702 m)  Wt 248 lb (112.492 kg)  BMI 38.83 kg/m2 Last Weight:  Wt Readings from Last 1 Encounters:  03/10/15 248 lb (112.492 kg)   [id number] mass index is 38.83 kg/(m^2).     Last Height:   Ht Readings from Last 1 Encounters:  03/10/15 5\' 7"  (1.702 m)    Physical exam:  General: The patient is awake, alert and appears not in acute distress. The patient is well groomed. Head: Normocephalic, atraumatic. Neck is supple. Mallampati 4 , poor dental status.  neck circumference:15. Nasal airflow unrestricted , TMJ is not evident . Retrognathia is seen.  Cardiovascular:  Regular rate and rhythm, without  murmurs or carotid bruit, and without distended neck veins. Respiratory: Lungs are clear to auscultation. Skin:  Without evidence of edema, or rash Trunk: BMI is elevated . The patient's posture is hunched    Neurologic exam : The patient is awake and alert, oriented to place and time.   Memory subjective described as intact.  Memory testing revealed no defiicts. Attention span & concentration ability appears normal.  Speech is fluent,  without dysarthria, dysphonia or aphasia.  Mood and affect are appropriate.  Cranial nerves: Pupils are equal and briskly reactive to light. Funduscopic exam without evidence of pallor or edema.  Extraocular movements  in vertical and horizontal planes intact and without nystagmus. Visual fields by finger perimetry are intact. Hearing to finger rub intact.   Facial sensation intact to fine touch.  Facial motor strength is  symmetric and tongue and uvula move midline. Shoulder shrug was symmetrical.   Motor exam:  Normal tone, muscle bulk and symmetric strength in all extremities.  Sensory:  Fine touch, pinprick and vibration were tested in all extremities. Proprioception tested in the upper extremities was normal.  Coordination: Rapid alternating movements in the fingers/hands was normal. Finger-to-nose maneuver without evidence of ataxia, dysmetria or tremor.  Gait and station: Patient walks with a cane for  assistive device and is able unassisted to climb up to the exam table.  Strength within normal limits.  Stance is stable and normal.  Toe and hell stand were not tested . Tandem gait is deferred - she Turns with 4 Steps. Romberg testing is negative.  Deep tendon reflexes: in the  upper and lower extremities are symmetric and intact. Babinski maneuver response is downgoing.  The patient was advised of the nature of the diagnosed sleep disorder , the treatment options and risks for general a health and wellness arising from not treating the condition.  I spent more than 40 minutes of face to face time with the patient. Greater than 50% of time was spent in counseling and coordination of care. We have discussed the diagnosis and differential and I answered the patient's questions.     Assessment:  After physical and neurologic examination, review of laboratory studies,  Personal review of imaging studies, reports of other /same  Imaging studies ,  Results of polysomnography/ neurophysiology testing and pre-existing records as far as provided in visit., my assessment is:   1) patient with a diagnosis of obstructive sleep apnea also her original test results are not available. She will has carried this diagnosis for over 13 years she has been treated for CPAP on an auto titrate  her and received a new machine just in December 2015, prescribed by Dr. Redmond Pulling. Aced on her download I see no need for her to switch the  machine change the settings or have another sleep study. We will prescribe the supplies she needs. Her risk factor is obesity- [facility].   2) chronic pain patient with spinal cord stimulator implant followed by local pain management clinic.  3) stabbing headache-ice pick headache patient with a diagnosis of intracerebral aneurysm without bleed, followed by Metta Clines, DO.    Plan:  Treatment plan and additional workup :  Dear Dr. Manuella Ghazi, Thank you for allowing me to participate in this patient's care. I will be happy to refill her supplies as needed I will need the manufacturer size and type of mask tubing and filter. I will see her once a year and if any difficulties with the sleep symptoms or as the machine itself arise I will be happy to see her outside of that window.   Sincerely,  Larey Seat MD  03/10/2015   CC: Monico Blitz, [address]  [place], [place] [id number]

## 2015-04-02 ENCOUNTER — Other Ambulatory Visit: Payer: Self-pay

## 2015-04-02 DIAGNOSIS — M79603 Pain in arm, unspecified: Secondary | ICD-10-CM

## 2015-04-16 ENCOUNTER — Encounter: Payer: Self-pay | Admitting: "Endocrinology

## 2015-04-21 ENCOUNTER — Encounter: Payer: Self-pay | Admitting: Surgery

## 2015-04-23 ENCOUNTER — Ambulatory Visit (HOSPITAL_COMMUNITY)
Admission: RE | Admit: 2015-04-23 | Discharge: 2015-04-23 | Disposition: A | Discharge status: Home / Self Care | Payer: Medicare Other | Source: Ambulatory Visit | Attending: Vascular Surgery | Admitting: Vascular Surgery

## 2015-04-23 DIAGNOSIS — M79603 Pain in arm, unspecified: Secondary | ICD-10-CM

## 2015-04-23 DIAGNOSIS — M79601 Pain in right arm: Secondary | ICD-10-CM | POA: Diagnosis not present

## 2015-04-26 ENCOUNTER — Encounter: Payer: Self-pay | Admitting: Surgery

## 2015-04-26 ENCOUNTER — Ambulatory Visit (INDEPENDENT_AMBULATORY_CARE_PROVIDER_SITE_OTHER): Payer: Medicare Other | Admitting: Surgery

## 2015-04-26 VITALS — BP 144/73 | HR 84 | Temp 97.8°F | Resp 16 | Ht 68.5 in | Wt 248.0 lb

## 2015-04-26 DIAGNOSIS — G540 Brachial plexus disorders: Secondary | ICD-10-CM | POA: Diagnosis not present

## 2015-04-26 NOTE — Progress Notes (Signed)
Filed Vitals:   04/26/15 0948 04/26/15 0959  BP: 152/73 144/73  Pulse: 86 84  Temp: 97.8 F (36.6 C)   TempSrc: Oral   Resp: 16   Height: 5' 8.5" (1.74 m)   Weight: 248 lb (112.492 kg)   SpO2: 98%   .     Patient name: Jodi Ray MRN: [id number] DOB: 12/12/1960 Sex: female     Chief Complaint  Patient presents with  . New Evaluation    Ref. By Dr. Assunta Gambles  C/O Right arm numbness/weakness and pain, pain level 30,  1 1/2  yrs.  -      HISTORY OF PRESENT ILLNESS:  This is a 54 year old female who comes in today for further evaluation of right arm pain and numbness.  She states that this is been going on for approximately one year and a half. It only affects her right arm. A lot of times it flexor up in the middle the night.  She has tried sleeping with her arms and different positions but has had no relief.  She describes a extreme level of pain and that her arm goes completely numb , losing all feeling and being unable to move it. She will then start to move it and shake it and ultimately able regain sensation. She has undergone electrophysiology evaluation.  This revealed chronic right upper extremity cervical radiculopathic changes with particular attention to the C5/6 level.  There was also bilateral upper extremity peripheral polyneuropathy changes affecting motor and sensory.    the patient suffers from diabetes. This has been getting better since she moved to even.  Her hemoglobin A1c was 10 , 2 years ago.  It is now decreased to 8.1.  She does take an ACE inhibitor for hypertension , however she has had episodes of hypotension for which she took salt pills. She also suffers from stabbing headaches for which she is being evaluated by neurology.  Past Medical History  Diagnosis Date  . Diabetes mellitus without complication   . Hypothyroidism   . Obesity   . Adult RDS   . Retinopathy   . Headache   . Sleep apnea   . Neuropathy   . OSA on CPAP 03/10/2015    Past  Surgical History  Procedure Laterality Date  . Foot surgery    . Spinal cord stimulator implant    . Leg surgery Right     Social History   Social History  . Marital Status: Unknown    Spouse Name: N/A  . Number of Children: N/A  . Years of Education: N/A   Occupational History  . Not on file.   Social History Main Topics  . Smoking status: Never Smoker   . Smokeless tobacco: Never Used  . Alcohol Use: No  . Drug Use: No  . Sexual Activity:    Partners: Male   Other Topics Concern  . Not on file   Social History Narrative   Drinks about 1-2 cups of caffeine daily.    Family History  Problem Relation Age of Onset  . Cancer Mother     colon  . Cancer Father     renal cell  . Cancer Brother     bladder  . Cancer Sister     brain   . Cancer Sister     breast     Allergies as of 04/26/2015 - Review Complete 04/26/2015  Allergen Reaction Noted  . Clonidine derivatives  08/03/2014    Current Outpatient Prescriptions  on File Prior to Visit  Medication Sig Dispense Refill  . aspirin EC 81 MG tablet Take 81 mg by mouth daily.    . baclofen (LIORESAL) 10 MG tablet Take 10 mg by mouth 3 (three) times daily.    . cholecalciferol (VITAMIN D) 400 UNITS TABS tablet Take 400 Units by mouth.    . gabapentin (NEURONTIN) 300 MG capsule   0  . gabapentin (NEURONTIN) 800 MG tablet Take 800 mg by mouth 3 (three) times daily.    . hydrochlorothiazide (MICROZIDE) 12.5 MG capsule Take 12.5 mg by mouth daily.    . insulin aspart (NOVOLOG) 100 UNIT/ML injection Inject into the skin 3 (three) times daily before meals.    Marland Kitchen levothyroxine (SYNTHROID, LEVOTHROID) 88 MCG tablet Take 88 mcg by mouth daily before breakfast.    . loratadine (CLARITIN) 10 MG tablet Take 10 mg by mouth daily.    . metFORMIN (GLUCOPHAGE) 1000 MG tablet Take 1,000 mg by mouth 2 (two) times daily with a meal.    . topiramate (TOPAMAX) 50 MG tablet Take 1 tablet (50 mg total) by mouth daily. 90 tablet 3  .  traMADol (ULTRAM-ER) 100 MG 24 hr tablet Take 100 mg by mouth daily.    . triazolam (HALCION) 0.25 MG tablet Take 0.25 mg by mouth at bedtime as needed for sleep.    . indomethacin (INDOCIN) 25 MG capsule Take 25 mg by mouth 2 (two) times daily with a meal.    . insulin detemir (LEVEMIR) 100 UNIT/ML injection Inject 43 Units into the skin at bedtime.     Marland Kitchen omeprazole (PRILOSEC) 40 MG capsule Take 40 mg by mouth daily.     No current facility-administered medications on file prior to visit.     REVIEW OF SYSTEMS: Cardiovascular: No chest pain, chest pressure, palpitations, orthopnea, or dyspnea on exertion. No claudication or rest pain,  No history of DVT or phlebitis. Positive for leg swelling. Pulmonary: No productive cough, asthma or wheezing. Neurologic:  See history of present illness Hematologic: No bleeding problems or clotting disorders. Musculoskeletal: No joint pain or joint swelling. Gastrointestinal: No blood in stool or hematemesis Genitourinary: No dysuria or hematuria. Psychiatric:: No history of major depression. Integumentary: No rashes or ulcers. Constitutional: No fever or chills.  PHYSICAL EXAMINATION:   Vital signs are  Filed Vitals:   04/26/15 0948 04/26/15 0959  BP: 152/73 144/73  Pulse: 86 84  Temp: 97.8 F (36.6 C)   TempSrc: Oral   Resp: 16   Height: 5' 8.5" (1.74 m)   Weight: 248 lb (112.492 kg)   SpO2: 98%    Body mass index is 37.16 kg/(m^2). General: The patient appears their stated age. HEENT:  No gross abnormalities Pulmonary:  Non labored breathing Abdomen: Soft and non-tender Musculoskeletal: There are no major deformities. Neurologic: No focal weakness or paresthesias are detected, Skin: There are no ulcer or rashes noted. Psychiatric: The patient has normal affect. Cardiovascular: There is a regular rate and rhythm without significant murmur appreciated. Palpable radial pulse bilaterally.  Allen's provocative maneuver did not decreased  the pulse.  She did have point tenderness in the supraclavicular region   When she moved her arms backwards and abducted them in a military-like position, this reproduced her symptoms   Diagnostic Studies  I have reviewed her electrophysiology study which shows chronic right upper extremity cervical radiculopathic changes with particular attention to the C5/6 level.  There was also bilateral upper extremity peripheral polyneuropathy changes affecting motor and  sensory.   vascular lab studies had been reviewed. There was decreased amplitude noted bilaterally with no waveform changes.    cervical spine CAT scan shows diffuse  the facet arthropathy and thoracic spondylosis  Assessment:  possible right sided thoracic outlet Plan:  the patient suffered an event in front of me.  This was very profound as she had severe pain and described her arm as going completely non-.  It did resolve relatively quickly.  This was brought on by provocative movements. Despite some of the data acquired from her testing, I'm certainly concerned about neurogenic thoracic outlet syndrome. She will likely need additional testing but since I do not perform decompression, I will refer her down to [facility] for further workup and possible treatment.  Eldridge Abrahams, M.D. [facility] Office: [phone] Pager:  [phone]

## 2015-04-30 ENCOUNTER — Encounter: Payer: Self-pay | Admitting: "Endocrinology

## 2015-04-30 ENCOUNTER — Ambulatory Visit (INDEPENDENT_AMBULATORY_CARE_PROVIDER_SITE_OTHER): Payer: Medicare Other | Admitting: "Endocrinology

## 2015-04-30 VITALS — BP 152/80 | HR 81 | Ht 69.0 in | Wt 250.0 lb

## 2015-04-30 DIAGNOSIS — E1065 Type 1 diabetes mellitus with hyperglycemia: Secondary | ICD-10-CM | POA: Diagnosis not present

## 2015-04-30 DIAGNOSIS — E782 Mixed hyperlipidemia: Secondary | ICD-10-CM

## 2015-04-30 DIAGNOSIS — E039 Hypothyroidism, unspecified: Secondary | ICD-10-CM | POA: Diagnosis not present

## 2015-04-30 DIAGNOSIS — I10 Essential (primary) hypertension: Secondary | ICD-10-CM | POA: Diagnosis not present

## 2015-04-30 DIAGNOSIS — IMO0002 Reserved for concepts with insufficient information to code with codable children: Secondary | ICD-10-CM | POA: Insufficient documentation

## 2015-04-30 MED ORDER — LANTUS SOLOSTAR 100 UNIT/ML ~~LOC~~ SOPN: 46.0000 [IU] | PEN_INJECTOR | Freq: Every day | SUBCUTANEOUS | Status: DC

## 2015-04-30 MED ORDER — INSULIN ASPART 100 UNIT/ML ~~LOC~~ SOLN: 15.0000 [IU] | Freq: Three times a day (TID) | SUBCUTANEOUS | Status: DC

## 2015-04-30 NOTE — Progress Notes (Signed)
Subjective:    Patient ID: Jodi Ray, female    DOB: Mar 04, 1961,    Past Medical History  Diagnosis Date  . Diabetes mellitus without complication   . Hypothyroidism   . Obesity   . Adult RDS   . Retinopathy   . Headache   . Sleep apnea   . Neuropathy   . OSA on CPAP 03/10/2015   Past Surgical History  Procedure Laterality Date  . Foot surgery    . Spinal cord stimulator implant    . Leg surgery Right    Social History   Social History  . Marital Status: Unknown    Spouse Name: N/A  . Number of Children: N/A  . Years of Education: N/A   Social History Main Topics  . Smoking status: Never Smoker   . Smokeless tobacco: Never Used  . Alcohol Use: No  . Drug Use: No  . Sexual Activity:    Partners: Male   Other Topics Concern  . None   Social History Narrative   Drinks about 1-2 cups of caffeine daily.   Outpatient Encounter Prescriptions as of 04/30/2015  Medication Sig  . aspirin EC 81 MG tablet Take 81 mg by mouth daily.  . baclofen (LIORESAL) 10 MG tablet Take 10 mg by mouth 3 (three) times daily.  . cholecalciferol (VITAMIN D) 400 UNITS TABS tablet Take 400 Units by mouth.  . gabapentin (NEURONTIN) 300 MG capsule   . gabapentin (NEURONTIN) 800 MG tablet Take 800 mg by mouth 3 (three) times daily.  . hydrochlorothiazide (MICROZIDE) 12.5 MG capsule Take 12.5 mg by mouth daily.  . insulin aspart (NOVOLOG) 100 UNIT/ML injection Inject 15 Units into the skin 3 (three) times daily before meals.  Marland Kitchen LANTUS SOLOSTAR 100 UNIT/ML Solostar Pen Inject 46 Units into the skin at bedtime.  Marland Kitchen levothyroxine (SYNTHROID, LEVOTHROID) 88 MCG tablet Take 88 mcg by mouth daily before breakfast.  . lisinopril (PRINIVIL,ZESTRIL) 10 MG tablet   . loratadine (CLARITIN) 10 MG tablet Take 10 mg by mouth daily.  . metFORMIN (GLUCOPHAGE) 1000 MG tablet Take 500 mg by mouth 2 (two) times daily with a meal.   . Multiple Vitamin (MULTIVITAMIN) tablet Take 1 tablet by mouth daily.   Marland Kitchen omeprazole (PRILOSEC) 40 MG capsule Take 40 mg by mouth daily.  Marland Kitchen PRENAT-FECBN-FEBISG-FA-FISHOIL PO as needed.  . topiramate (TOPAMAX) 50 MG tablet Take 1 tablet (50 mg total) by mouth daily.  . traMADol (ULTRAM-ER) 100 MG 24 hr tablet Take 100 mg by mouth daily.  . triazolam (HALCION) 0.25 MG tablet Take 0.25 mg by mouth at bedtime as needed for sleep.  . [DISCONTINUED] insulin aspart (NOVOLOG) 100 UNIT/ML injection Inject into the skin 3 (three) times daily before meals.  . [DISCONTINUED] LANTUS SOLOSTAR 100 UNIT/ML Solostar Pen 40 Units at bedtime.   . B-D ULTRAFINE III SHORT PEN 31G X 8 MM MISC 4 (four) times daily.  . [DISCONTINUED] indomethacin (INDOCIN) 25 MG capsule Take 25 mg by mouth 2 (two) times daily with a meal.  . [DISCONTINUED] insulin detemir (LEVEMIR) 100 UNIT/ML injection Inject 43 Units into the skin at bedtime.   . [DISCONTINUED] meloxicam (MOBIC) 7.5 MG tablet as needed.   No facility-administered encounter medications on file as of 04/30/2015.   ALLERGIES: Allergies  Allergen Reactions  . Clonidine Derivatives     Coma   VACCINATION STATUS:  There is no immunization history on file for this patient.  Diabetes She presents for her follow-up diabetic  visit. She has type 1 diabetes mellitus. Onset time: diagnosed at age 50. Pertinent negatives for hypoglycemia include no confusion, headaches, pallor or seizures. Associated symptoms include fatigue, polydipsia and polyphagia. Pertinent negatives for diabetes include no chest pain and no polyuria. There are no hypoglycemic complications. Diabetic complications include nephropathy and retinopathy. Current diabetic treatment includes insulin injections and oral agent (monotherapy). She is compliant with treatment most of the time. Her weight is decreasing steadily. She has not had a previous visit with a dietitian (declined .). She rarely participates in exercise. Her home blood glucose trend is increasing steadily. Her  overall blood glucose range is >200 mg/dl. An ACE inhibitor/angiotensin II receptor blocker is being taken. Eye exam is current.  Hypertension This is a chronic problem. The current episode started more than 1 year ago. The problem is controlled. Pertinent negatives include no chest pain, headaches, palpitations or shortness of breath. Hypertensive end-organ damage includes retinopathy.  Hyperlipidemia This is a chronic problem. The current episode started more than 1 year ago. The problem is controlled. Pertinent negatives include no chest pain, myalgias or shortness of breath.     Review of Systems  Constitutional: Positive for fatigue. Negative for unexpected weight change.  HENT: Negative for trouble swallowing and voice change.   Eyes: Negative for visual disturbance.  Respiratory: Negative for cough, shortness of breath and wheezing.   Cardiovascular: Negative for chest pain, palpitations and leg swelling.  Gastrointestinal: Negative for nausea, vomiting and diarrhea.  Endocrine: Positive for polydipsia and polyphagia. Negative for cold intolerance, heat intolerance and polyuria.  Musculoskeletal: Negative for myalgias and arthralgias.  Skin: Negative for color change, pallor, rash and wound.  Neurological: Negative for seizures and headaches.  Psychiatric/Behavioral: Negative for suicidal ideas and confusion.    Objective:    BP 152/80 mmHg  Pulse 81  Ht 5\' 9"  (1.753 m)  Wt 250 lb (113.399 kg)  BMI 36.90 kg/m2  SpO2 97%  Wt Readings from Last 3 Encounters:  04/30/15 250 lb (113.399 kg)  04/26/15 248 lb (112.492 kg)  04/16/15 255 lb (115.667 kg)    Physical Exam  Constitutional: overweight, in NAD. She uses her cane. Eyes: PERRLA, EOMI, no exophthalmos ENT: moist mucous membranes, no thyromegaly, no cervical lymphadenopathy Cardiovascular: RRR, No MRG Respiratory: CTA B Gastrointestinal: abdomen soft, NT, ND, BS+ Musculoskeletal: no deformities, strength intact in all  4 Skin: moist, warm, no rashes Neurological: no tremor with outstretched hands, DTR normal in all 4.   Results for orders placed or performed in visit on 04/16/15  Hemoglobin A1c  Result Value Ref Range   Hgb A1c MFr Bld 9.6 (A) 4.0 - 6.0 %    Her repeat  a1c ha sincreased to 10.4%      Assessment & Plan:   1. Type I diabetes mellitus, uncontrolled  Patient is advised to stick to a routine mealtimes to eat 3 meals  a day and avoid unnecessary snacks ( to snack only to correct hypoglycemia). Patient is advised to eliminate simple carbs  from their diet including cakes desserts ice cream soda (  diet and regular) , sweet tea , Candies,  chips and cookies, artificial sweeteners,   and "sugar-free" products .  This will help patient to have stable blood glucose profile and potentially lose weight. Patient is given detailed personalized glucose monitoring and insulin dosing instructions. Patient is instructed to call back with extremes of blood glucose less than 70 or greater than 300. Patient to bring meter and  blood glucose logs during their next visit. I will increase Lantus to 46 units qhs, Novolog to 15 units TIDAC plus correction dose. I will continue MTF 500mg  po BID. Repeat - Basic metabolic panel and - Hemoglobin A1c before next visit.  2. Mixed hyperlipidemia - continue diet and exercise.  3. Primary hypothyroidism Continue LT4 88 mcg of qam. - TSH - T4, free  4. Benign hypertension -continue lisinopril.    Follow up plan: Return in about 3 months (around 07/30/2015) for diabetes, high blood pressure, high cholesterol, underactive thyroid, follow up with pre-visit labs, meter, and logs.  Glade Lloyd, MD Phone: [phone]  Fax: [phone]   04/30/2015, 9:18 PM

## 2015-05-09 ENCOUNTER — Other Ambulatory Visit: Payer: Self-pay | Admitting: "Endocrinology

## 2015-05-10 ENCOUNTER — Encounter: Payer: Self-pay | Admitting: Internal Medicine

## 2015-05-20 ENCOUNTER — Ambulatory Visit (INDEPENDENT_AMBULATORY_CARE_PROVIDER_SITE_OTHER): Payer: Medicare Other | Admitting: Otolaryngology

## 2015-05-20 DIAGNOSIS — H6123 Impacted cerumen, bilateral: Secondary | ICD-10-CM | POA: Diagnosis not present

## 2015-07-02 ENCOUNTER — Other Ambulatory Visit: Payer: Self-pay | Admitting: "Endocrinology

## 2015-07-05 ENCOUNTER — Other Ambulatory Visit: Payer: Self-pay | Admitting: "Endocrinology

## 2015-07-23 ENCOUNTER — Other Ambulatory Visit: Payer: Self-pay | Admitting: "Endocrinology

## 2015-07-23 LAB — BASIC METABOLIC PANEL
BUN: 35 mg/dL — AB (ref 7–25)
CHLORIDE: 101 mmol/L (ref 98–110)
CO2: 29 mmol/L (ref 20–31)
CREATININE: 0.98 mg/dL (ref 0.50–1.05)
Calcium: 9.7 mg/dL (ref 8.6–10.4)
Glucose, Bld: 73 mg/dL (ref 65–99)
Potassium: 5.2 mmol/L (ref 3.5–5.3)
Sodium: 139 mmol/L (ref 135–146)

## 2015-07-23 LAB — TSH: TSH: 1.99 u[IU]/mL (ref 0.350–4.500)

## 2015-07-23 LAB — T4, FREE: Free T4: 1.09 ng/dL (ref 0.80–1.80)

## 2015-07-24 LAB — HEMOGLOBIN A1C
Hgb A1c MFr Bld: 9.1 % — ABNORMAL HIGH (ref <5.7)
Mean Plasma Glucose: 214 mg/dL — ABNORMAL HIGH (ref <117)

## 2015-07-28 ENCOUNTER — Other Ambulatory Visit: Payer: Self-pay | Admitting: "Endocrinology

## 2015-08-02 ENCOUNTER — Ambulatory Visit: Payer: Medicare Other | Admitting: "Endocrinology

## 2015-08-04 ENCOUNTER — Encounter: Payer: Self-pay | Admitting: "Endocrinology

## 2015-08-04 ENCOUNTER — Ambulatory Visit (INDEPENDENT_AMBULATORY_CARE_PROVIDER_SITE_OTHER): Payer: Medicare Other | Admitting: "Endocrinology

## 2015-08-04 VITALS — BP 136/74 | HR 86 | Ht 69.0 in | Wt 254.0 lb

## 2015-08-04 DIAGNOSIS — E108 Type 1 diabetes mellitus with unspecified complications: Secondary | ICD-10-CM

## 2015-08-04 DIAGNOSIS — E039 Hypothyroidism, unspecified: Secondary | ICD-10-CM

## 2015-08-04 DIAGNOSIS — E782 Mixed hyperlipidemia: Secondary | ICD-10-CM

## 2015-08-04 DIAGNOSIS — E1065 Type 1 diabetes mellitus with hyperglycemia: Secondary | ICD-10-CM

## 2015-08-04 DIAGNOSIS — IMO0002 Reserved for concepts with insufficient information to code with codable children: Secondary | ICD-10-CM

## 2015-08-04 MED ORDER — INSULIN GLARGINE 100 UNIT/ML SOLOSTAR PEN: 40.0000 [IU] | PEN_INJECTOR | Freq: Every day | SUBCUTANEOUS | Status: DC

## 2015-08-04 NOTE — Progress Notes (Signed)
Subjective:    Patient ID: Jodi Ray, female    DOB: 06/29/1961,    Past Medical History  Diagnosis Date  . Diabetes mellitus without complication ([place])   . Hypothyroidism   . Obesity   . Adult RDS ([place])   . Retinopathy   . Headache   . Sleep apnea   . Neuropathy ([place])   . OSA on CPAP 03/10/2015   Past Surgical History  Procedure Laterality Date  . Foot surgery    . Spinal cord stimulator implant    . Leg surgery Right    Social History   Social History  . Marital Status: Unknown    Spouse Name: N/A  . Number of Children: N/A  . Years of Education: N/A   Social History Main Topics  . Smoking status: Never Smoker   . Smokeless tobacco: Never Used  . Alcohol Use: No  . Drug Use: No  . Sexual Activity:    Partners: Male   Other Topics Concern  . None   Social History Narrative   Drinks about 1-2 cups of caffeine daily.   Outpatient Encounter Prescriptions as of 08/04/2015  Medication Sig  . aspirin EC 81 MG tablet Take 81 mg by mouth daily.  . B-D ULTRAFINE III SHORT PEN 31G X 8 MM MISC 4 (four) times daily.  . baclofen (LIORESAL) 10 MG tablet Take 10 mg by mouth 3 (three) times daily.  . cholecalciferol (VITAMIN D) 400 UNITS TABS tablet Take 400 Units by mouth.  . gabapentin (NEURONTIN) 300 MG capsule   . gabapentin (NEURONTIN) 800 MG tablet Take 800 mg by mouth 3 (three) times daily.  . hydrochlorothiazide (MICROZIDE) 12.5 MG capsule Take 12.5 mg by mouth daily.  . insulin aspart (NOVOLOG) 100 UNIT/ML injection Inject 15 Units into the skin 3 (three) times daily before meals.  . Insulin Glargine (BASAGLAR KWIKPEN) 100 UNIT/ML Solostar Pen Inject 40 Units into the skin at bedtime.  Marland Kitchen levothyroxine (SYNTHROID, LEVOTHROID) 112 MCG tablet take 1 tablet by mouth once daily  . lisinopril (PRINIVIL,ZESTRIL) 10 MG tablet take 1 tablet by mouth once daily  . loratadine (CLARITIN) 10 MG tablet Take 10 mg by mouth daily.  . metFORMIN (GLUCOPHAGE) 500 MG tablet  take 1 tablet by mouth twice a day with food  . Multiple Vitamin (MULTIVITAMIN) tablet Take 1 tablet by mouth daily.  Marland Kitchen omeprazole (PRILOSEC) 40 MG capsule Take 40 mg by mouth daily.  Marland Kitchen PRENAT-FECBN-FEBISG-FA-FISHOIL PO as needed.  . topiramate (TOPAMAX) 50 MG tablet Take 1 tablet (50 mg total) by mouth daily.  . traMADol (ULTRAM-ER) 100 MG 24 hr tablet Take 100 mg by mouth daily.  . triazolam (HALCION) 0.25 MG tablet Take 0.25 mg by mouth at bedtime as needed for sleep.  . [DISCONTINUED] LANTUS SOLOSTAR 100 UNIT/ML Solostar Pen Inject 46 Units into the skin at bedtime.   No facility-administered encounter medications on file as of 08/04/2015.   ALLERGIES: Allergies  Allergen Reactions  . Clonidine Derivatives     Coma  . Other     opiates   VACCINATION STATUS:  There is no immunization history on file for this patient.  Diabetes She presents for her follow-up diabetic visit. She has type 1 diabetes mellitus. Onset time: diagnosed at age 46. Her disease course has been improving. Pertinent negatives for hypoglycemia include no confusion, headaches, pallor or seizures. Associated symptoms include fatigue, polydipsia and polyphagia. Pertinent negatives for diabetes include no chest pain and no polyuria. There  are no hypoglycemic complications. Symptoms are improving. Diabetic complications include nephropathy and retinopathy. Risk factors for coronary artery disease include diabetes mellitus, hypertension, obesity and sedentary lifestyle. Current diabetic treatment includes insulin injections and oral agent (monotherapy). She is compliant with treatment most of the time. Her weight is decreasing steadily. She is following a generally unhealthy diet. When asked about meal planning, she reported none. She has not had a previous visit with a dietitian (declined .). She rarely participates in exercise. Her home blood glucose trend is increasing steadily (She does have random hypoglycemia.). Her  breakfast blood glucose range is generally 110-130 mg/dl. Her lunch blood glucose range is generally 130-140 mg/dl. Her dinner blood glucose range is generally 140-180 mg/dl. Her overall blood glucose range is >200 mg/dl. An ACE inhibitor/angiotensin II receptor blocker is being taken. Eye exam is current.  Hypertension This is a chronic problem. The current episode started more than 1 year ago. The problem is controlled. Pertinent negatives include no chest pain, headaches, palpitations or shortness of breath. Risk factors for coronary artery disease include diabetes mellitus, obesity and sedentary lifestyle. Hypertensive end-organ damage includes retinopathy and a thyroid problem.  Hyperlipidemia This is a chronic problem. The current episode started more than 1 year ago. The problem is controlled. Pertinent negatives include no chest pain, myalgias or shortness of breath.  Thyroid Problem Presents for follow-up visit. Symptoms include fatigue. Patient reports no cold intolerance, diarrhea, heat intolerance or palpitations. The symptoms have been improving. Past treatments include levothyroxine. Her past medical history is significant for hyperlipidemia.     Review of Systems  Constitutional: Positive for fatigue. Negative for unexpected weight change.  HENT: Negative for trouble swallowing and voice change.   Eyes: Negative for visual disturbance.  Respiratory: Negative for cough, shortness of breath and wheezing.   Cardiovascular: Negative for chest pain, palpitations and leg swelling.  Gastrointestinal: Negative for nausea, vomiting and diarrhea.  Endocrine: Positive for polydipsia and polyphagia. Negative for cold intolerance, heat intolerance and polyuria.  Musculoskeletal: Negative for myalgias and arthralgias.  Skin: Negative for color change, pallor, rash and wound.  Neurological: Negative for seizures and headaches.  Psychiatric/Behavioral: Negative for suicidal ideas and confusion.     Objective:    BP 136/74 mmHg  Pulse 86  Ht 5\' 9"  (1.753 m)  Wt 254 lb (115.214 kg)  BMI 37.49 kg/m2  SpO2 97%  Wt Readings from Last 3 Encounters:  08/04/15 254 lb (115.214 kg)  04/30/15 250 lb (113.399 kg)  04/26/15 248 lb (112.492 kg)    Physical Exam  Constitutional: She is oriented to person, place, and time. She appears well-developed.  HENT:  Head: Normocephalic and atraumatic.  Eyes: EOM are normal.  Neck: Normal range of motion. Neck supple. No tracheal deviation present. No thyromegaly present.  Cardiovascular: Normal rate and regular rhythm.   Pulmonary/Chest: Effort normal and breath sounds normal.  Abdominal: Soft. Bowel sounds are normal. There is no tenderness. There is no guarding.  Musculoskeletal: She exhibits no edema.  Neurological: She is alert and oriented to person, place, and time. She displays abnormal reflex. A sensory deficit is present. No cranial nerve deficit. Coordination normal.  Skin: Skin is warm and dry. No rash noted. No erythema. No pallor.  Psychiatric: She has a normal mood and affect. Judgment normal.      Results for orders placed or performed in visit on 99991111  Basic metabolic panel  Result Value Ref Range   Sodium 139 135 - 146 mmol/L  Potassium 5.2 3.5 - 5.3 mmol/L   Chloride 101 98 - 110 mmol/L   CO2 29 20 - 31 mmol/L   Glucose, Bld 73 65 - 99 mg/dL   BUN 35 (H) 7 - 25 mg/dL   Creat 0.98 0.50 - 1.05 mg/dL   Calcium 9.7 8.6 - 10.4 mg/dL  TSH  Result Value Ref Range   TSH 1.990 0.350 - 4.500 uIU/mL  T4, free  Result Value Ref Range   Free T4 1.09 0.80 - 1.80 ng/dL  Hemoglobin A1c  Result Value Ref Range   Hgb A1c MFr Bld 9.1 (H) <5.7 %   Mean Plasma Glucose 214 (H) <117 mg/dL     Assessment & Plan:   1. Type I diabetes mellitus, uncontrolled  Patient is advised to stick to a routine mealtimes to eat 3 meals  a day and avoid unnecessary snacks ( to snack only to correct hypoglycemia). Patient is advised to  eliminate simple carbs  from their diet including cakes desserts ice cream soda (  diet and regular) , sweet tea , Candies,  chips and cookies, artificial sweeteners,   and "sugar-free" products .  This will help patient to have stable blood glucose profile and potentially lose weight. Patient is given detailed personalized glucose monitoring and insulin dosing instructions. -She came with improved A1c of 9.1%, however she does have random blood significant hypoglycemia associated with hypoglycemia unawareness.  - I will decrease Lantus to 40 units qhs,  continue Novolog to 15 units TIDAC plus correction dose. - I will continue MTF 500mg  po BID. - Patient is instructed to call back with extremes of blood glucose less than 70 or greater than 300.   2. Mixed hyperlipidemia - continue diet and exercise. I will obtain fasting lipid panel before her next visit  3. Primary hypothyroidism Continue LT4 88 mcg of qam.  - We discussed about correct intake of levothyroxine, at fasting, with water, separated by at least 30 minutes from breakfast, and separated by more than 4 hours from calcium, iron, multivitamins, acid reflux medications (PPIs). -Patient is made aware of the fact that thyroid hormone replacement is needed for life, dose to be adjusted by periodic monitoring of thyroid function tests.   4. Benign hypertension: Controlled -continue lisinopril.  5. Weight/obesity: She declined CDE referral. Detailed Carbohydrate information and exercise regimen discussed with her.  Follow up plan: Return in about 3 months (around 11/02/2015) for diabetes, high blood pressure, high cholesterol, underactive thyroid, follow up with pre-visit labs, meter, and logs.  Glade Lloyd, MD Phone: [phone]  Fax: [phone]   08/04/2015, 1:34 PM

## 2015-08-11 ENCOUNTER — Telehealth: Payer: Self-pay | Admitting: "Endocrinology

## 2015-08-11 NOTE — Telephone Encounter (Signed)
Pt is requesting an appt. To discuss her low blood sugars since her insurance will not cover the [facility] continuous monitoring system. Can we schedule appt.

## 2015-08-12 DIAGNOSIS — E109 Type 1 diabetes mellitus without complications: Secondary | ICD-10-CM | POA: Diagnosis not present

## 2015-08-19 DIAGNOSIS — E1042 Type 1 diabetes mellitus with diabetic polyneuropathy: Secondary | ICD-10-CM | POA: Diagnosis not present

## 2015-08-19 DIAGNOSIS — E10649 Type 1 diabetes mellitus with hypoglycemia without coma: Secondary | ICD-10-CM | POA: Diagnosis not present

## 2015-08-19 DIAGNOSIS — E1031 Type 1 diabetes mellitus with unspecified diabetic retinopathy: Secondary | ICD-10-CM | POA: Diagnosis not present

## 2015-09-02 ENCOUNTER — Encounter: Payer: Self-pay | Admitting: Neurology

## 2015-09-07 NOTE — Telephone Encounter (Signed)
Yes she can come with her log and meter, doesn't have to have labs.

## 2015-09-08 NOTE — Telephone Encounter (Signed)
Pt contacted her insurance co. And she did receive the [facility]

## 2015-09-23 ENCOUNTER — Other Ambulatory Visit: Payer: Self-pay

## 2015-09-23 MED ORDER — LEVOTHYROXINE SODIUM 112 MCG PO TABS: 112.0000 ug | ORAL_TABLET | Freq: Every day | ORAL | Status: DC

## 2015-09-24 ENCOUNTER — Other Ambulatory Visit: Payer: Self-pay

## 2015-09-24 MED ORDER — METFORMIN HCL 500 MG PO TABS: 500.0000 mg | ORAL_TABLET | Freq: Two times a day (BID) | ORAL | Status: DC

## 2015-09-29 DIAGNOSIS — E109 Type 1 diabetes mellitus without complications: Secondary | ICD-10-CM | POA: Diagnosis not present

## 2015-09-29 DIAGNOSIS — Z6839 Body mass index (BMI) 39.0-39.9, adult: Secondary | ICD-10-CM | POA: Diagnosis not present

## 2015-09-29 DIAGNOSIS — I10 Essential (primary) hypertension: Secondary | ICD-10-CM | POA: Diagnosis not present

## 2015-09-29 DIAGNOSIS — Z789 Other specified health status: Secondary | ICD-10-CM | POA: Diagnosis not present

## 2015-09-29 DIAGNOSIS — E1122 Type 2 diabetes mellitus with diabetic chronic kidney disease: Secondary | ICD-10-CM | POA: Diagnosis not present

## 2015-09-29 DIAGNOSIS — N183 Chronic kidney disease, stage 3 (moderate): Secondary | ICD-10-CM | POA: Diagnosis not present

## 2015-10-08 ENCOUNTER — Other Ambulatory Visit: Payer: Self-pay

## 2015-10-08 MED ORDER — INSULIN ASPART 100 UNIT/ML FLEXPEN: 15.0000 [IU] | PEN_INJECTOR | Freq: Three times a day (TID) | SUBCUTANEOUS | Status: DC

## 2015-10-12 DIAGNOSIS — E669 Obesity, unspecified: Secondary | ICD-10-CM | POA: Diagnosis not present

## 2015-10-12 DIAGNOSIS — E1065 Type 1 diabetes mellitus with hyperglycemia: Secondary | ICD-10-CM | POA: Diagnosis not present

## 2015-10-12 DIAGNOSIS — E039 Hypothyroidism, unspecified: Secondary | ICD-10-CM | POA: Diagnosis not present

## 2015-10-12 DIAGNOSIS — E119 Type 2 diabetes mellitus without complications: Secondary | ICD-10-CM | POA: Diagnosis not present

## 2015-10-12 DIAGNOSIS — I10 Essential (primary) hypertension: Secondary | ICD-10-CM | POA: Diagnosis not present

## 2015-10-27 DIAGNOSIS — M549 Dorsalgia, unspecified: Secondary | ICD-10-CM | POA: Diagnosis not present

## 2015-10-27 DIAGNOSIS — E109 Type 1 diabetes mellitus without complications: Secondary | ICD-10-CM | POA: Diagnosis not present

## 2015-10-27 DIAGNOSIS — I10 Essential (primary) hypertension: Secondary | ICD-10-CM | POA: Diagnosis not present

## 2015-11-11 DIAGNOSIS — H25013 Cortical age-related cataract, bilateral: Secondary | ICD-10-CM | POA: Diagnosis not present

## 2015-11-11 DIAGNOSIS — E109 Type 1 diabetes mellitus without complications: Secondary | ICD-10-CM | POA: Diagnosis not present

## 2015-11-16 ENCOUNTER — Ambulatory Visit: Payer: Medicare Other | Admitting: "Endocrinology

## 2015-11-18 ENCOUNTER — Ambulatory Visit (INDEPENDENT_AMBULATORY_CARE_PROVIDER_SITE_OTHER): Payer: Medicare Other | Admitting: Otolaryngology

## 2015-11-18 ENCOUNTER — Other Ambulatory Visit (HOSPITAL_COMMUNITY)
Admission: RE | Admit: 2015-11-18 | Discharge: 2015-11-18 | Disposition: A | Discharge status: Home / Self Care | Payer: Medicare Other | Source: Ambulatory Visit | Attending: Otolaryngology | Admitting: Otolaryngology

## 2015-11-18 DIAGNOSIS — D489 Neoplasm of uncertain behavior, unspecified: Secondary | ICD-10-CM

## 2015-11-18 DIAGNOSIS — H6123 Impacted cerumen, bilateral: Secondary | ICD-10-CM

## 2015-11-18 DIAGNOSIS — L98499 Non-pressure chronic ulcer of skin of other sites with unspecified severity: Secondary | ICD-10-CM | POA: Insufficient documentation

## 2015-12-16 DIAGNOSIS — I10 Essential (primary) hypertension: Secondary | ICD-10-CM | POA: Diagnosis not present

## 2015-12-16 DIAGNOSIS — E669 Obesity, unspecified: Secondary | ICD-10-CM | POA: Diagnosis not present

## 2015-12-16 DIAGNOSIS — E039 Hypothyroidism, unspecified: Secondary | ICD-10-CM | POA: Diagnosis not present

## 2015-12-16 DIAGNOSIS — E119 Type 2 diabetes mellitus without complications: Secondary | ICD-10-CM | POA: Diagnosis not present

## 2016-01-12 ENCOUNTER — Ambulatory Visit (INDEPENDENT_AMBULATORY_CARE_PROVIDER_SITE_OTHER): Payer: Medicare Other | Admitting: Neurology

## 2016-01-12 ENCOUNTER — Encounter: Payer: Self-pay | Admitting: Neurology

## 2016-01-12 VITALS — BP 126/80 | HR 92 | Ht 69.0 in | Wt 255.0 lb

## 2016-01-12 DIAGNOSIS — G4485 Primary stabbing headache: Secondary | ICD-10-CM

## 2016-01-12 DIAGNOSIS — M79601 Pain in right arm: Secondary | ICD-10-CM | POA: Diagnosis not present

## 2016-01-12 DIAGNOSIS — E1042 Type 1 diabetes mellitus with diabetic polyneuropathy: Secondary | ICD-10-CM | POA: Diagnosis not present

## 2016-01-12 MED ORDER — WRIST SPLINT/COCK-UP/LEFT L MISC: 1.0000 | Status: DC

## 2016-01-12 MED ORDER — TOPIRAMATE 50 MG PO TABS: 50.0000 mg | ORAL_TABLET | Freq: Every day | ORAL | Status: DC

## 2016-01-12 MED ORDER — WRIST SPLINT/COCK-UP/RIGHT L MISC: 1.0000 | Status: DC

## 2016-01-12 NOTE — Progress Notes (Signed)
NEUROLOGY FOLLOW UP OFFICE NOTE  Sharrel Frock RN:[id number]  HISTORY OF PRESENT ILLNESS: Jodi Ray is a 55 year old right-handed woman with chronic pain related to complex regional pain syndrome (with spinal stimulator), hypertension, type I diabetes mellitus with neuropathy, OSA on CPAP, hypothyroidism, polymyalgia rheumatica, and morbid obesity who follows up for primary stabbing headache.  UPDATE: Current preventative therapy:  Topiramate 50mg , Melatonin 10mg  nightly Headaches are well controlled.  They are dull 4/10 pain, occuring about 3 times a month.  She would like to discuss a chronic issue that is new to me.  Since 2015, she has had episodes of right arm pain and numbness.  It only occurs when she is laying in bed, and occurs no matter what position.  Her entire arm goes "dead".  It is both numb, painful and unable to move it.  There is no shooting pain down the arm from the neck, however she gets occasional shooting pain from the right side of her neck into the shoulder.  She has to use her other arm to shake it out and it resolves in a minute or two.  It occurs every night.  She was sent to pain management for possible cervical radiculopathy or thoracic outlet syndrome.  She did receive injections, which helped.  She had an MRI of the cervical and thoracic spine on 06/12/14.  Imaging not available, but report mentions diffuse facet arthropathy and degenerative changes in the cervical spine, but no nerve root impingement or cord compression.  There was no evidence of thoracic outlet syndrome.  She was sent to [facility] for further evaluation.  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  They told her that her symptoms were related to her diabetic neuropathy.  HISTORY: Onset:  2011.  She was previously treated in [place], where she was  diagnosed with primary stabbing headache. Location:  Right frontal region Quality:  stabbing Intensity:  10/10 Aura:  no Prodrome:  no Associated symptoms:  Some nausea if severe. No autonomic symptoms. Duration:  10 to 60 minutes Frequency:  4 times a week Triggers/exacerbating factors:  none Relieving factors:  none Activity:  Cannot function when experiencing it.  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  However, a repeat CTA performed on 02/22/12 did not reveal any aneurysm.  She reportedly had a follow up CTA in February 2015, which is not available to me.  She also has migraine headaches.  They are bi-frontal/maxillary and associated with slight nausea.  She has had this before when her allergies "act up."    She also has complex regional pain syndrome following an accident where she fell down the steps and crushed her right leg.  She takes gabapentin, tramadol and has a spinal nerve stimulator.  She takes this for her painful diabetic neuropathy as well.  She was evaluated by vascular surgery for   PAST MEDICAL HISTORY: Past Medical History  Diagnosis Date  . Diabetes mellitus without complication ([place])   . Hypothyroidism   . Obesity   . Adult RDS ([place])   . Retinopathy   . Headache   . Sleep apnea   .  Neuropathy ([place])   . OSA on CPAP 03/10/2015    MEDICATIONS: Current Outpatient Prescriptions on File Prior to Visit  Medication Sig Dispense Refill  . aspirin EC 81 MG tablet Take 81 mg by mouth daily.    . B-D ULTRAFINE III SHORT PEN 31G X 8 MM MISC 4 (four) times daily.  0  . baclofen (LIORESAL) 10 MG tablet Take 10 mg by mouth 3 (three) times daily.    . cholecalciferol (VITAMIN D) 400 UNITS TABS tablet Take 800 Units by mouth daily.      Marland Kitchen gabapentin (NEURONTIN) 300 MG capsule Take 600 mg by mouth 2 (two) times daily.   0  . gabapentin (NEURONTIN) 800 MG tablet Take 800 mg by mouth 3 (three) times daily.    . hydrochlorothiazide (MICROZIDE) 12.5 MG capsule Take 12.5 mg by mouth daily.    . insulin aspart (NOVOLOG FLEXPEN) 100 UNIT/ML FlexPen Inject 15-21 Units into the skin 3 (three) times daily with meals. 15 mL 2  . Insulin Glargine (BASAGLAR KWIKPEN) 100 UNIT/ML Solostar Pen Inject 40 Units into the skin at bedtime. 15 mL 2  . levothyroxine (SYNTHROID, LEVOTHROID) 112 MCG tablet Take 1 tablet (112 mcg total) by mouth daily. 30 tablet 2  . lisinopril (PRINIVIL,ZESTRIL) 10 MG tablet take 1 tablet by mouth once daily 30 tablet 2  . loratadine (CLARITIN) 10 MG tablet Take 10 mg by mouth daily.    . metFORMIN (GLUCOPHAGE) 500 MG tablet Take 1 tablet (500 mg total) by mouth 2 (two) times daily with a meal. 60 tablet 2  . Multiple Vitamin (MULTIVITAMIN) tablet Take 1 tablet by mouth daily.    Marland Kitchen omeprazole (PRILOSEC) 40 MG capsule Take 40 mg by mouth daily.    Marland Kitchen PRENAT-FECBN-FEBISG-FA-FISHOIL PO as needed.  2  . traMADol (ULTRAM-ER) 100 MG 24 hr tablet Take 100 mg by mouth daily.    . triazolam (HALCION) 0.25 MG tablet Take 0.25 mg by mouth at bedtime as needed for sleep.     No current facility-administered medications on file prior to visit.    ALLERGIES: Allergies  Allergen Reactions  . Clonidine Derivatives     Coma  . Other     opiates    FAMILY HISTORY: Family History  Problem Relation Age of Onset  . Cancer Mother     colon  . Cancer Father     renal cell  . Cancer Brother     bladder  . Cancer Sister     brain   . Cancer Sister     breast     SOCIAL HISTORY: Social History   Social History  . Marital Status: Unknown    Spouse Name: N/A  . Number of Children: N/A  . Years of Education: N/A   Occupational History  . Not on file.   Social History Main Topics  . Smoking status: Never Smoker     . Smokeless tobacco: Never Used  . Alcohol Use: No  . Drug Use: No  . Sexual Activity:    Partners: Male   Other Topics Concern  . Not on file   Social History Narrative   Drinks about 1-2 cups of caffeine daily.    REVIEW OF SYSTEMS: Constitutional: No fevers, chills, or sweats, no generalized fatigue, change in appetite Eyes: No visual changes, double vision, eye pain Ear, nose and throat: No hearing loss, ear pain, nasal congestion, sore throat Cardiovascular: No chest pain, palpitations Respiratory:  No shortness of breath at rest  or with exertion, wheezes GastrointestinaI: No nausea, vomiting, diarrhea, abdominal pain, fecal incontinence Genitourinary:  No dysuria, urinary retention or frequency Musculoskeletal:  No neck pain, back pain Integumentary: No rash, pruritus, skin lesions Neurological: as above Psychiatric: No depression, insomnia, anxiety Endocrine: No palpitations, fatigue, diaphoresis, mood swings, change in appetite, change in weight, increased thirst Hematologic/Lymphatic:  No purpura, petechiae. Allergic/Immunologic: no itchy/runny eyes, nasal congestion, recent allergic reactions, rashes  PHYSICAL EXAM: Filed Vitals:   01/12/16 0902  BP: 126/80  Pulse: 92   General: No acute distress.  Patient appears well-groomed.  obese body habitus. Head:  Normocephalic/atraumatic Eyes:  Fundi examined but not visualized Neck: supple, no paraspinal tenderness, full range of motion Heart:  Regular rate and rhythm Lungs:  Clear to auscultation bilaterally Back: No paraspinal tenderness Neurological Exam: alert and oriented to person, place, and time. Attention span and concentration intact, recent and remote memory intact, fund of knowledge intact.  Speech fluent and not dysarthric, language intact.  CN II-XII intact. Bulk and tone normal, decreased effort testing muscle strength in all extremities due to pain..  Reduced light touch sensation in right hand.  Deep  tendon reflexes 1+ throughout except absent in ankles.  Finger to nose testing intact.  Gait with right limp.  Positive Phalen test on right.  IMPRESSION: Primary stabbing headache, controlled Right sided arm pain and numbness, likely carpal tunnel syndrome complicated by underlying diabetic neuropathy.  PLAN: 1.  Continue topiramate 50mg  and melatonin 10mg  at bedtime for headache.  Will obtain recent labs from endocrinologist (BMP) 2.  Will prescribe wrist splint.  If not effective in one to two months, will refer to hand specialist.  I did explain prognosis of any treatment is uncertain given underlying neuropathy. 3.  Type 1 diabetes, uncontrolled for 40 years but recently has been controlled with insulin pump 4.  Follow up in one year  Metta Clines, DO  CC:  Monico Blitz, MD

## 2016-01-12 NOTE — Progress Notes (Signed)
Chart forwarded.  

## 2016-01-12 NOTE — Patient Instructions (Signed)
1.  Continue topiramate 50mg  at bedtime and Melatonin 10mg  at bedtime 2.  I think you have carpal tunnel syndrome, complicated by the diabetic neuropathy.  We will give you a prescription for a wrist splint and go to [facility] to buy it.  3.  Contact me in 1 to 2 months.  If not better, we can refer you to a hand specialist. 4.  We will get recent labs from your endocrinologist 5.  Otherwise, follow up in one year.

## 2016-01-27 DIAGNOSIS — N183 Chronic kidney disease, stage 3 (moderate): Secondary | ICD-10-CM | POA: Diagnosis not present

## 2016-01-27 DIAGNOSIS — E1122 Type 2 diabetes mellitus with diabetic chronic kidney disease: Secondary | ICD-10-CM | POA: Diagnosis not present

## 2016-01-27 DIAGNOSIS — Z299 Encounter for prophylactic measures, unspecified: Secondary | ICD-10-CM | POA: Diagnosis not present

## 2016-01-27 DIAGNOSIS — I10 Essential (primary) hypertension: Secondary | ICD-10-CM | POA: Diagnosis not present

## 2016-01-27 DIAGNOSIS — N189 Chronic kidney disease, unspecified: Secondary | ICD-10-CM | POA: Diagnosis not present

## 2016-02-16 DIAGNOSIS — M159 Polyosteoarthritis, unspecified: Secondary | ICD-10-CM | POA: Diagnosis not present

## 2016-02-16 DIAGNOSIS — I10 Essential (primary) hypertension: Secondary | ICD-10-CM | POA: Diagnosis not present

## 2016-02-16 DIAGNOSIS — E119 Type 2 diabetes mellitus without complications: Secondary | ICD-10-CM | POA: Diagnosis not present

## 2016-03-09 ENCOUNTER — Ambulatory Visit (INDEPENDENT_AMBULATORY_CARE_PROVIDER_SITE_OTHER): Payer: Medicare Other | Admitting: Neurology

## 2016-03-09 ENCOUNTER — Encounter: Payer: Self-pay | Admitting: Neurology

## 2016-03-09 VITALS — BP 140/68 | HR 80 | Resp 20 | Ht 69.0 in | Wt 253.0 lb

## 2016-03-09 DIAGNOSIS — G4733 Obstructive sleep apnea (adult) (pediatric): Secondary | ICD-10-CM | POA: Diagnosis not present

## 2016-03-09 DIAGNOSIS — G4730 Sleep apnea, unspecified: Secondary | ICD-10-CM

## 2016-03-09 DIAGNOSIS — Z9989 Dependence on other enabling machines and devices: Secondary | ICD-10-CM

## 2016-03-09 DIAGNOSIS — G4700 Insomnia, unspecified: Secondary | ICD-10-CM | POA: Diagnosis not present

## 2016-03-09 MED ORDER — DIPHENHYDRAMINE-APAP (SLEEP) 25-500 MG PO TABS: 1.0000 | ORAL_TABLET | Freq: Every evening | ORAL | 2 refills | Status: DC | PRN

## 2016-03-09 NOTE — Progress Notes (Signed)
SLEEP MEDICINE CLINIC   Provider:  Larey Seat, M D  Referring Provider: Monico Blitz, MD Primary Care Physician:  Monico Blitz, MD  Chief Complaint  Patient presents with  . Follow-up    cpap going well, using [employer]    HPI:  Chlora Songco is a 55 y.o. female , seen here as a referral from Dr. Manuella Ghazi for transfer of sleep care,  Chief complaint according to patient : I need new supplies, i don't need a sleep study.   Mrs. Meadows, a right handed caucasian female patient of Dr. Trena Platt, is today referred for follow-up of her sleep apnea condition. She was diagnosed more than 13 years ago with obstructive sleep apnea and had been followed by Dr. Adriana Reams but he has left the [facility] , she has received a new CPAP machine just in December 2015 so she is not in need of any new equipment but she needs continued supplies. As she has been a Medicare patient since 2006 she will need a sleep doctor visit once a year to allow her to obtain CPAP supplies. The patient moved here from [place] in 2015 and her original sleep study was performed in [place], she was told they are no records available as they were older than 10 years. Her risk factors are clear as she has hypertension associated with diabetes, morbid obesity, hypothyroidism and diabetes. She was also diagnosed with a polyneuropathy, she has been diagnosed with an intracerebral aneurysm and was a stabbing headache or ice pick headache. She is a nonsmoker and nondrinker and she drinks only one cup of caffeine at coffee per day. Her blood pressure has been well controlled on her current medical regimen,  is almost too low.  Mrs. Mcgruder is followed by a pain management practice. She has a new general  neurologist, Metta Clines, DO , who follows her headaches.   I'm able to review Mrs. Jackowski's last CPAP download, obtained on 03-09-15, just yesterday in preparation for this visit. It shows that she is 100% compliant for 30 out  of 30 days and all these days she has used her CPAP more than 4 hours, 7 hours and 52 minutes on average. Her pressure is 10.6 cm water with a full-time EPR of 2 cm water. A residual AHI is 1.4. She does have high air leaks. The prescription for the setting followed and outer titration. It seems that she is still using an AutoSet a so-called AUTO set S 10 machine by [employer].   She is feeling comfortable with the current setting is sleeping well and does not wake up with headaches, dry mouth or from snoring. She will have one bathroom break at night. She usually sleeps from 10 PM plus minus one hour, to 6 AM when her dog wakes her up. She feels refreshed and restored in the mornings and feels that overall the morning time as her best time of the day to be productive and energized. She does not nap in daytime.  Interval history from 03/09/2016, Mrs. the Molinda Bailiff is seen here today for follow-up on her sleep apnea therapy on CPAP. This is a compliance visit. She does not drink does not use tobacco products or recreational drugs, she endorsed apnea ice pick headaches and speech difficulties. Her compliance download shows that she has used the machine 100% of all days over 4 hours with an average user time of 8 hours and 19 minutes, Herceptin pressure is 10.5 cm water full-time EPR of  2 cm water residual AHI is excellent at only 1.3. There has been no major air leak. The needs to be no adjustments made to the machine. She also responds with an Epworth sleepiness score of only 3 points fatigue severity scale was not endorsed.  She is taking a sleep aid for almost 10 years, continued under Dr Dois Davenport care.     Sleep medical history and family sleep history: She is the only apnea patient in her family, no history of sleep walking. Social history: non smoker, non s drinker, 1 cup of coffee daily , disabled. [place] 2[place] ,   Review of Systems: Out of a complete 14 system review, the patient complains  of only the following symptoms, and all other reviewed systems are negative. The patient endorsed the Epworth Sleepiness Scale at 7 points and the fatigue severity scale at 13 points- well below average. Nasal allergic rhinitis, diabetes type 1 but better controlled. Weight gain, chronic back pain she has a spinal cord stimulator 2005 she has "Charcot "-feet and had a bone surgery to the left foot 2010. She had a fracture repaired with hardware in the right leg 2003.   Social History   Social History  . Marital status: Unknown    Spouse name: N/A  . Number of children: N/A  . Years of education: N/A   Occupational History  . Not on file.   Social History Main Topics  . Smoking status: Never Smoker  . Smokeless tobacco: Never Used  . Alcohol use No  . Drug use: No  . Sexual activity: Yes    Partners: Male   Other Topics Concern  . Not on file   Social History Narrative   Drinks about 1-2 cups of caffeine daily.    Family History  Problem Relation Age of Onset  . Cancer Mother     colon  . Cancer Father     renal cell  . Cancer Brother     bladder  . Cancer Sister     brain   . Cancer Sister     breast     Past Medical History:  Diagnosis Date  . Adult RDS ([place])   . Diabetes mellitus without complication ([place])   . Headache   . Hypothyroidism   . Neuropathy ([place])   . Obesity   . OSA on CPAP 03/10/2015  . Retinopathy   . Sleep apnea     Past Surgical History:  Procedure Laterality Date  . FOOT SURGERY    . LEG SURGERY Right   . SPINAL CORD STIMULATOR IMPLANT      Current Outpatient Prescriptions  Medication Sig Dispense Refill  . aspirin EC 81 MG tablet Take 81 mg by mouth daily.    . B-D ULTRAFINE III SHORT PEN 31G X 8 MM MISC 4 (four) times daily.  0  . baclofen (LIORESAL) 10 MG tablet Take 10 mg by mouth 3 (three) times daily.    . cholecalciferol (VITAMIN D) 400 UNITS TABS tablet Take 800 Units by mouth daily.     . Elastic Bandages & Supports  (WRIST SPLINT/COCK-UP/LEFT L) MISC 1 each by Does not apply route as directed. 1 each 0  . Elastic Bandages & Supports (WRIST SPLINT/COCK-UP/RIGHT L) MISC 1 each by Does not apply route as directed. 1 each 0  . gabapentin (NEURONTIN) 300 MG capsule Take 600 mg by mouth 2 (two) times daily.   0  . gabapentin (NEURONTIN) 800 MG tablet  Take 800 mg by mouth daily.     . hydrochlorothiazide (MICROZIDE) 12.5 MG capsule Take 12.5 mg by mouth daily.    Marland Kitchen levothyroxine (SYNTHROID, LEVOTHROID) 112 MCG tablet Take 1 tablet (112 mcg total) by mouth daily. 30 tablet 2  . lisinopril (PRINIVIL,ZESTRIL) 10 MG tablet take 1 tablet by mouth once daily 30 tablet 2  . loratadine (CLARITIN) 10 MG tablet Take 10 mg by mouth daily.    . metFORMIN (GLUCOPHAGE) 500 MG tablet Take 1 tablet (500 mg total) by mouth 2 (two) times daily with a meal. 60 tablet 2  . Multiple Vitamin (MULTIVITAMIN) tablet Take 1 tablet by mouth daily.    Marland Kitchen omeprazole (PRILOSEC) 40 MG capsule Take 40 mg by mouth daily.    Marland Kitchen PRENAT-FECBN-FEBISG-FA-FISHOIL PO as needed.  2  . topiramate (TOPAMAX) 50 MG tablet Take 1 tablet (50 mg total) by mouth daily. 90 tablet 3  . traMADol (ULTRAM-ER) 100 MG 24 hr tablet Take 100 mg by mouth daily.    . triazolam (HALCION) 0.25 MG tablet Take 0.25 mg by mouth at bedtime as needed for sleep.     No current facility-administered medications for this visit.     Allergies as of 03/09/2016 - Review Complete 03/09/2016  Allergen Reaction Noted  . Clonidine derivatives  08/03/2014  . Other  08/04/2015    Vitals: BP 140/68   Pulse 80   Resp 20   Ht 5\' 9"  (1.753 m)   Wt 253 lb (114.8 kg)   BMI 37.36 kg/m  Last Weight:  Wt Readings from Last 1 Encounters:  03/09/16 253 lb (114.8 kg)   [id number] mass index is 37.36 kg/m.     Last Height:   Ht Readings from Last 1 Encounters:  03/09/16 5\' 9"  (1.753 m)    Physical exam:  General: The patient is awake, alert and appears not in acute distress. The  patient is well groomed. Head: Normocephalic, atraumatic. Neck is supple. Mallampati 4 , poor dental status.  neck circumference:15. Nasal airflow unrestricted , TMJ is not evident . Retrognathia is seen.  Cardiovascular:  Regular rate and rhythm, without  murmurs or carotid bruit, and without distended neck veins. Respiratory: Lungs are clear to auscultation. Skin:  Without evidence of edema, or rash Trunk: BMI is elevated . The patient's posture is hunched    Neurologic exam : The patient is awake and alert, oriented to place and time.   Memory subjective described as intact.  Memory testing revealed no defiicts. Attention span & concentration ability appears normal.  Speech is fluent,  without dysarthria, dysphonia or aphasia.  Mood and affect are appropriate.  Cranial nerves: Pupils are equal and briskly reactive to light. Funduscopic exam without evidence of pallor or edema.  Extraocular movements  in vertical and horizontal planes intact and without nystagmus. Visual fields by finger perimetry are intact. Hearing to finger rub intact.   Facial sensation intact to fine touch.  Facial motor strength is symmetric and tongue and uvula move midline. Shoulder shrug was symmetrical.   Motor exam:  Normal tone, muscle bulk and symmetric strength in all extremities.  Sensory:  Fine touch, pinprick and vibration were tested in all extremities. Proprioception tested in the upper extremities was normal.  Coordination: Rapid alternating movements in the fingers/hands was normal. Finger-to-nose maneuver without evidence of ataxia, dysmetria or tremor.  Gait and station: Patient walks with a cane for  assistive device and is able unassisted to climb up to the exam table.  Strength within normal limits.  Stance is stable and normal.  Toe and hell stand were not tested . Tandem gait is deferred - she Turns with 4 Steps. Romberg testing is negative.  Deep tendon reflexes: in the  upper and  lower extremities are symmetric and intact. Babinski maneuver response is downgoing.  The patient was advised of the nature of the diagnosed sleep disorder , the treatment options and risks for general a health and wellness arising from not treating the condition.  I spent more than 40 minutes of face to face time with the patient. Greater than 50% of time was spent in counseling and coordination of care. We have discussed the diagnosis and differential and I answered the patient's questions.     Assessment:  After physical and neurologic examination, review of laboratory studies,  Personal review of imaging studies, reports of other /same  Imaging studies ,  Results of polysomnography/ neurophysiology testing and pre-existing records as far as provided in visit., my assessment is:   1) patient with a diagnosis of obstructive sleep apnea also her original test results are not available. She will has carried this diagnosis for over 13 years she has been treated for CPAP on an auto titrating machine  and received a new machine just in December 2015, prescribed by Dr. Redmond Pulling.  Based  on her 2016 and now 2017 downloads,   I see no need for her to switch the machine change the settings or have another sleep study. We will prescribe the supplies she needs. Her main risk factor is obesity- and muscle relaxant therapy . DME is [facility].   2) chronic pain patient with spinal cord stimulator implant followed by local pain management clinic.  3) stabbing headache-ice pick headache patient with a diagnosis of intracerebral aneurysm without bleed, followed by Metta Clines, DO.    Plan:  Treatment plan and additional workup :  Dear Dr. Manuella Ghazi, Dear Dr. Loretta Plume  Thank you for allowing me to participate in this patient's care. I will be happy to refill her supplies as needed I will need the manufacturer size and type of mask tubing and filter. I will see her once a year and if any difficulties with the sleep  symptoms or as the machine itself arise I will be happy to see her outside of that window. I understand that Mrs. hamideh has been using a benzodiazepine sleep aid for over 7 years and claims that she has not had side effects from it and is very worried to wean off. I will discuss some eye tinnitus with her today but in general would not be opposed to keeping her on this medication from my neurologic standpoint. Unfortunately, treating her obstructive sleep apnea has not led to an improvement in her ice pick headaches but these have been more bearable and less frequent under the topiramate therapy.  Sincerely,  Larey Seat MD  03/09/2016   CC: Monico Blitz, [address], [place] [address]

## 2016-03-09 NOTE — Patient Instructions (Addendum)
You have been taking a low dose halcion of 0.25 mg nightly and slept well, we are trying to reduce your exposure to benzodiazepines. Try every other night to take a tylenol PM instead of Halcion, this shall make you sleepy.   Long term use of halcion has been associated with early dementia.  You will see my NP in your next visit and let her know how you did with this change.   Please remember to try to maintain good sleep hygiene, which means: Keep a regular sleep and wake schedule, try not to exercise or have a meal within 2 hours of your bedtime, try to keep your bedroom conducive for sleep, that is, cool and dark, without light distractors such as an illuminated alarm clock, and refrain from watching TV right before sleep or in the middle of the night and do not keep the TV or radio on during the night. Also, try not to use or play on electronic devices at bedtime, such as your cell phone, tablet PC or laptop. If you like to read at bedtime on an electronic device, try to dim the background light as much as possible. Do not eat in the middle of the night.   For chronic insomnia, you are best followed by a psychiatrist and/or sleep psychologist.

## 2016-03-10 ENCOUNTER — Encounter: Payer: Self-pay | Admitting: Neurology

## 2016-03-17 DIAGNOSIS — E109 Type 1 diabetes mellitus without complications: Secondary | ICD-10-CM | POA: Diagnosis not present

## 2016-03-17 DIAGNOSIS — E119 Type 2 diabetes mellitus without complications: Secondary | ICD-10-CM | POA: Diagnosis not present

## 2016-03-17 DIAGNOSIS — E039 Hypothyroidism, unspecified: Secondary | ICD-10-CM | POA: Diagnosis not present

## 2016-03-21 DIAGNOSIS — E119 Type 2 diabetes mellitus without complications: Secondary | ICD-10-CM | POA: Diagnosis not present

## 2016-03-21 DIAGNOSIS — Z9641 Presence of insulin pump (external) (internal): Secondary | ICD-10-CM | POA: Diagnosis not present

## 2016-03-21 DIAGNOSIS — I10 Essential (primary) hypertension: Secondary | ICD-10-CM | POA: Diagnosis not present

## 2016-03-21 DIAGNOSIS — E039 Hypothyroidism, unspecified: Secondary | ICD-10-CM | POA: Diagnosis not present

## 2016-03-28 DIAGNOSIS — N183 Chronic kidney disease, stage 3 (moderate): Secondary | ICD-10-CM | POA: Diagnosis not present

## 2016-03-28 DIAGNOSIS — E1142 Type 2 diabetes mellitus with diabetic polyneuropathy: Secondary | ICD-10-CM | POA: Diagnosis not present

## 2016-03-28 DIAGNOSIS — E1122 Type 2 diabetes mellitus with diabetic chronic kidney disease: Secondary | ICD-10-CM | POA: Diagnosis not present

## 2016-04-28 DIAGNOSIS — Z6841 Body Mass Index (BMI) 40.0 and over, adult: Secondary | ICD-10-CM | POA: Diagnosis not present

## 2016-04-28 DIAGNOSIS — N183 Chronic kidney disease, stage 3 (moderate): Secondary | ICD-10-CM | POA: Diagnosis not present

## 2016-04-28 DIAGNOSIS — E1122 Type 2 diabetes mellitus with diabetic chronic kidney disease: Secondary | ICD-10-CM | POA: Diagnosis not present

## 2016-04-28 DIAGNOSIS — I8390 Asymptomatic varicose veins of unspecified lower extremity: Secondary | ICD-10-CM | POA: Diagnosis not present

## 2016-04-28 DIAGNOSIS — G4700 Insomnia, unspecified: Secondary | ICD-10-CM | POA: Diagnosis not present

## 2016-05-16 DIAGNOSIS — E1142 Type 2 diabetes mellitus with diabetic polyneuropathy: Secondary | ICD-10-CM | POA: Diagnosis not present

## 2016-05-16 DIAGNOSIS — Z299 Encounter for prophylactic measures, unspecified: Secondary | ICD-10-CM | POA: Diagnosis not present

## 2016-05-16 DIAGNOSIS — Z6841 Body Mass Index (BMI) 40.0 and over, adult: Secondary | ICD-10-CM | POA: Diagnosis not present

## 2016-05-16 DIAGNOSIS — Z713 Dietary counseling and surveillance: Secondary | ICD-10-CM | POA: Diagnosis not present

## 2016-05-16 DIAGNOSIS — I10 Essential (primary) hypertension: Secondary | ICD-10-CM | POA: Diagnosis not present

## 2016-05-18 ENCOUNTER — Ambulatory Visit (INDEPENDENT_AMBULATORY_CARE_PROVIDER_SITE_OTHER): Payer: Medicare Other | Admitting: Otolaryngology

## 2016-05-18 DIAGNOSIS — H608X3 Other otitis externa, bilateral: Secondary | ICD-10-CM

## 2016-05-25 ENCOUNTER — Encounter: Payer: Self-pay | Admitting: Vascular Surgery

## 2016-05-26 DIAGNOSIS — M159 Polyosteoarthritis, unspecified: Secondary | ICD-10-CM | POA: Diagnosis not present

## 2016-05-26 DIAGNOSIS — I10 Essential (primary) hypertension: Secondary | ICD-10-CM | POA: Diagnosis not present

## 2016-05-26 DIAGNOSIS — E119 Type 2 diabetes mellitus without complications: Secondary | ICD-10-CM | POA: Diagnosis not present

## 2016-05-26 DIAGNOSIS — Z1231 Encounter for screening mammogram for malignant neoplasm of breast: Secondary | ICD-10-CM | POA: Diagnosis not present

## 2016-05-30 ENCOUNTER — Encounter: Payer: Self-pay | Admitting: Vascular Surgery

## 2016-05-30 ENCOUNTER — Ambulatory Visit (INDEPENDENT_AMBULATORY_CARE_PROVIDER_SITE_OTHER): Payer: Medicare Other | Admitting: Vascular Surgery

## 2016-05-30 VITALS — BP 121/71 | HR 94 | Temp 97.7°F | Resp 16 | Ht 68.0 in | Wt 251.0 lb

## 2016-05-30 DIAGNOSIS — I83893 Varicose veins of bilateral lower extremities with other complications: Secondary | ICD-10-CM

## 2016-05-30 NOTE — Progress Notes (Signed)
Subjective:     Patient ID: Jodi Ray, female   DOB: 1960/11/15, 55 y.o.   MRN: [id number]  HPI This 55 year old female was referred by Dr.Shah for evaluation of bilateral painful varicosities. The patient states that she has aching discomfort in both thighs during the night. She rarely has discomfort when she is on her feet during the day. She does wear short leg elastic compression stockings. She has no history of ulceration or bleeding. She does have type 1 diabetes mellitus and has an insulin pump. She has diabetic neuropathy and has had numbness in her feet for long time. She does have some darkish discoloration below the knee of both legs and the skin. She does develop some mild swelling as the day progresses. She does not take anticoagulants.  Past Medical History:  Diagnosis Date  . Adult RDS ([place])   . Diabetes mellitus without complication ([place])   . Headache   . Hypothyroidism   . Neuropathy ([place])   . Obesity   . OSA on CPAP 03/10/2015  . Retinopathy   . Sleep apnea   . Varicose veins of both lower extremities     Social History  Substance Use Topics  . Smoking status: Never Smoker  . Smokeless tobacco: Never Used  . Alcohol use No    Family History  Problem Relation Age of Onset  . Cancer Mother     colon  . Cancer Father     renal cell  . Cancer Brother     bladder  . Cancer Sister     brain   . Cancer Sister     breast     Allergies  Allergen Reactions  . Clonidine Derivatives     Coma  . Other     opiates     Current Outpatient Prescriptions:  .  aspirin EC 81 MG tablet, Take 81 mg by mouth daily., Disp: , Rfl:  .  B-D ULTRAFINE III SHORT PEN 31G X 8 MM MISC, 4 (four) times daily., Disp: , Rfl: 0 .  cholecalciferol (VITAMIN D) 400 UNITS TABS tablet, Take 800 Units by mouth daily. , Disp: , Rfl:  .  diphenhydramine-acetaminophen (TYLENOL PM) 25-500 MG TABS tablet, Take 1 tablet by mouth at bedtime as needed., Disp: 30 tablet, Rfl: 2 .  Elastic  Bandages & Supports (WRIST SPLINT/COCK-UP/LEFT L) MISC, 1 each by Does not apply route as directed., Disp: 1 each, Rfl: 0 .  Elastic Bandages & Supports (WRIST SPLINT/COCK-UP/RIGHT L) MISC, 1 each by Does not apply route as directed., Disp: 1 each, Rfl: 0 .  gabapentin (NEURONTIN) 300 MG capsule, Take 600 mg by mouth 2 (two) times daily. , Disp: , Rfl: 0 .  gabapentin (NEURONTIN) 800 MG tablet, Take 800 mg by mouth daily. , Disp: , Rfl:  .  hydrochlorothiazide (MICROZIDE) 12.5 MG capsule, Take 12.5 mg by mouth daily., Disp: , Rfl:  .  levothyroxine (SYNTHROID, LEVOTHROID) 112 MCG tablet, Take 1 tablet (112 mcg total) by mouth daily. (Patient taking differently: Take 125 mcg by mouth daily. Per patient dose is now 125 mcg by mouth daily in the am), Disp: 30 tablet, Rfl: 2 .  lisinopril (PRINIVIL,ZESTRIL) 10 MG tablet, take 1 tablet by mouth once daily, Disp: 30 tablet, Rfl: 2 .  loratadine (CLARITIN) 10 MG tablet, Take 10 mg by mouth daily., Disp: , Rfl:  .  metFORMIN (GLUCOPHAGE) 500 MG tablet, Take 1 tablet (500 mg total) by mouth 2 (two) times daily with a meal.,  Disp: 60 tablet, Rfl: 2 .  Multiple Vitamin (MULTIVITAMIN) tablet, Take 1 tablet by mouth daily., Disp: , Rfl:  .  omeprazole (PRILOSEC) 40 MG capsule, Take 40 mg by mouth daily., Disp: , Rfl:  .  PRENAT-FECBN-FEBISG-FA-FISHOIL PO, as needed., Disp: , Rfl: 2 .  topiramate (TOPAMAX) 50 MG tablet, Take 1 tablet (50 mg total) by mouth daily., Disp: 90 tablet, Rfl: 3 .  traMADol (ULTRAM-ER) 100 MG 24 hr tablet, Take 100 mg by mouth daily., Disp: , Rfl:  .  triazolam (HALCION) 0.25 MG tablet, Take 0.25 mg by mouth at bedtime as needed for sleep., Disp: , Rfl:   Vitals:   05/30/16 1344  BP: 121/71  Pulse: 94  Resp: 16  Temp: 97.7 F (36.5 C)  SpO2: 98%  Weight: 251 lb (113.9 kg)  Height: 5\' 8"  (1.727 m)    Body mass index is 38.16 kg/m.         Review of Systems Patient denies chest pain but does have dyspnea on exertion.  Morbid obesity. Has history of GERD, sleep apnea, type 1 diabetes mellitus, stage III chronic renal insufficiency, previous right lower extremity fracture with repair with rods.    Objective:   Physical Exam BP 121/71 (BP Location: Left Arm, Patient Position: Sitting, Cuff Size: Large)   Pulse 94   Temp 97.7 F (36.5 C)   Resp 16   Ht 5\' 8"  (1.727 m)   Wt 251 lb (113.9 kg)   SpO2 98%   BMI 38.16 kg/m     Gen.-alert and oriented x3 in no apparent distress-morbidly obese HEENT normal for age Lungs no rhonchi or wheezing Cardiovascular regular rhythm no murmurs carotid pulses 3+ palpable no bruits audible Abdomen soft nontender no palpable masses Musculoskeletal free of  major deformities Skin clear -no rashes Neurologic normal Lower extremities 3+ femoral and dorsalis pedis pulses palpable bilaterally with no edema Both legs with spider veins on lateral thigh and lateral and medial calf area. Some hyperpigmentation lower half of both legs but not concentrated around the ankle with no thickening of the skin. No active ulcer noted. No bulging varicosities noted.  Today I performed a bedside ultrasound-sono site exam. Both great saphenous veins appear normal with no evidence of reflux.       Assessment:     Bilateral leg discomfort-usually nocturnal-no evidence of arterial or venous insufficiency Diabetes mellitus with known diabetic neuropathy    Plan:     Not certain of etiology of her leg discomfort but it is not related to her arterial or venous system and no further venous workup is indicated Recommended continuing short leg elastic compression stockings and weight loss for prevention of venous disease in the future. Returns in the on a when necessary basis

## 2016-06-13 ENCOUNTER — Ambulatory Visit: Payer: Medicare Other | Admitting: Adult Health

## 2016-06-21 DIAGNOSIS — I10 Essential (primary) hypertension: Secondary | ICD-10-CM | POA: Diagnosis not present

## 2016-06-21 DIAGNOSIS — M159 Polyosteoarthritis, unspecified: Secondary | ICD-10-CM | POA: Diagnosis not present

## 2016-06-21 DIAGNOSIS — E119 Type 2 diabetes mellitus without complications: Secondary | ICD-10-CM | POA: Diagnosis not present

## 2016-07-14 DIAGNOSIS — E039 Hypothyroidism, unspecified: Secondary | ICD-10-CM | POA: Diagnosis not present

## 2016-07-14 DIAGNOSIS — Z1389 Encounter for screening for other disorder: Secondary | ICD-10-CM | POA: Diagnosis not present

## 2016-07-14 DIAGNOSIS — Z1211 Encounter for screening for malignant neoplasm of colon: Secondary | ICD-10-CM | POA: Diagnosis not present

## 2016-07-14 DIAGNOSIS — I10 Essential (primary) hypertension: Secondary | ICD-10-CM | POA: Diagnosis not present

## 2016-07-14 DIAGNOSIS — E669 Obesity, unspecified: Secondary | ICD-10-CM | POA: Diagnosis not present

## 2016-07-14 DIAGNOSIS — Z299 Encounter for prophylactic measures, unspecified: Secondary | ICD-10-CM | POA: Diagnosis not present

## 2016-07-14 DIAGNOSIS — Z0000 Encounter for general adult medical examination without abnormal findings: Secondary | ICD-10-CM | POA: Diagnosis not present

## 2016-07-14 DIAGNOSIS — Z6839 Body mass index (BMI) 39.0-39.9, adult: Secondary | ICD-10-CM | POA: Diagnosis not present

## 2016-07-14 DIAGNOSIS — Z7189 Other specified counseling: Secondary | ICD-10-CM | POA: Diagnosis not present

## 2016-07-14 DIAGNOSIS — E109 Type 1 diabetes mellitus without complications: Secondary | ICD-10-CM | POA: Diagnosis not present

## 2016-07-19 DIAGNOSIS — E039 Hypothyroidism, unspecified: Secondary | ICD-10-CM | POA: Diagnosis not present

## 2016-07-19 DIAGNOSIS — I10 Essential (primary) hypertension: Secondary | ICD-10-CM | POA: Diagnosis not present

## 2016-07-19 DIAGNOSIS — Z79899 Other long term (current) drug therapy: Secondary | ICD-10-CM | POA: Diagnosis not present

## 2016-07-25 ENCOUNTER — Encounter: Payer: Medicare Other | Admitting: Vascular Surgery

## 2016-08-24 DIAGNOSIS — E119 Type 2 diabetes mellitus without complications: Secondary | ICD-10-CM | POA: Diagnosis not present

## 2016-08-24 DIAGNOSIS — I10 Essential (primary) hypertension: Secondary | ICD-10-CM | POA: Diagnosis not present

## 2016-08-24 DIAGNOSIS — M159 Polyosteoarthritis, unspecified: Secondary | ICD-10-CM | POA: Diagnosis not present

## 2016-09-22 DIAGNOSIS — E119 Type 2 diabetes mellitus without complications: Secondary | ICD-10-CM | POA: Diagnosis not present

## 2016-09-22 DIAGNOSIS — M159 Polyosteoarthritis, unspecified: Secondary | ICD-10-CM | POA: Diagnosis not present

## 2016-09-22 DIAGNOSIS — I10 Essential (primary) hypertension: Secondary | ICD-10-CM | POA: Diagnosis not present

## 2016-09-27 DIAGNOSIS — Z299 Encounter for prophylactic measures, unspecified: Secondary | ICD-10-CM | POA: Diagnosis not present

## 2016-09-27 DIAGNOSIS — E1142 Type 2 diabetes mellitus with diabetic polyneuropathy: Secondary | ICD-10-CM | POA: Diagnosis not present

## 2016-09-27 DIAGNOSIS — E1122 Type 2 diabetes mellitus with diabetic chronic kidney disease: Secondary | ICD-10-CM | POA: Diagnosis not present

## 2016-09-27 DIAGNOSIS — I10 Essential (primary) hypertension: Secondary | ICD-10-CM | POA: Diagnosis not present

## 2016-09-27 DIAGNOSIS — N183 Chronic kidney disease, stage 3 (moderate): Secondary | ICD-10-CM | POA: Diagnosis not present

## 2016-09-27 DIAGNOSIS — Z6841 Body Mass Index (BMI) 40.0 and over, adult: Secondary | ICD-10-CM | POA: Diagnosis not present

## 2016-09-27 DIAGNOSIS — M778 Other enthesopathies, not elsewhere classified: Secondary | ICD-10-CM | POA: Diagnosis not present

## 2016-09-27 DIAGNOSIS — Z789 Other specified health status: Secondary | ICD-10-CM | POA: Diagnosis not present

## 2016-09-27 DIAGNOSIS — Z713 Dietary counseling and surveillance: Secondary | ICD-10-CM | POA: Diagnosis not present

## 2016-09-27 DIAGNOSIS — G4700 Insomnia, unspecified: Secondary | ICD-10-CM | POA: Diagnosis not present

## 2016-10-11 DIAGNOSIS — R109 Unspecified abdominal pain: Secondary | ICD-10-CM | POA: Diagnosis not present

## 2016-10-11 DIAGNOSIS — R10821 Right upper quadrant rebound abdominal tenderness: Secondary | ICD-10-CM | POA: Diagnosis not present

## 2016-10-11 DIAGNOSIS — Z299 Encounter for prophylactic measures, unspecified: Secondary | ICD-10-CM | POA: Diagnosis not present

## 2016-10-11 DIAGNOSIS — K828 Other specified diseases of gallbladder: Secondary | ICD-10-CM | POA: Diagnosis not present

## 2016-10-11 DIAGNOSIS — Z713 Dietary counseling and surveillance: Secondary | ICD-10-CM | POA: Diagnosis not present

## 2016-10-11 DIAGNOSIS — R1011 Right upper quadrant pain: Secondary | ICD-10-CM | POA: Diagnosis not present

## 2016-10-11 DIAGNOSIS — K760 Fatty (change of) liver, not elsewhere classified: Secondary | ICD-10-CM | POA: Diagnosis not present

## 2016-10-11 DIAGNOSIS — Z6841 Body Mass Index (BMI) 40.0 and over, adult: Secondary | ICD-10-CM | POA: Diagnosis not present

## 2016-10-13 DIAGNOSIS — R739 Hyperglycemia, unspecified: Secondary | ICD-10-CM | POA: Diagnosis not present

## 2016-10-13 DIAGNOSIS — Z794 Long term (current) use of insulin: Secondary | ICD-10-CM | POA: Diagnosis not present

## 2016-10-13 DIAGNOSIS — R1011 Right upper quadrant pain: Secondary | ICD-10-CM | POA: Diagnosis not present

## 2016-10-13 DIAGNOSIS — Z9641 Presence of insulin pump (external) (internal): Secondary | ICD-10-CM | POA: Diagnosis not present

## 2016-10-13 DIAGNOSIS — E1165 Type 2 diabetes mellitus with hyperglycemia: Secondary | ICD-10-CM | POA: Diagnosis not present

## 2016-10-13 DIAGNOSIS — K8050 Calculus of bile duct without cholangitis or cholecystitis without obstruction: Secondary | ICD-10-CM | POA: Diagnosis not present

## 2016-10-13 DIAGNOSIS — Z79899 Other long term (current) drug therapy: Secondary | ICD-10-CM | POA: Diagnosis not present

## 2016-10-16 DIAGNOSIS — K8050 Calculus of bile duct without cholangitis or cholecystitis without obstruction: Secondary | ICD-10-CM | POA: Diagnosis not present

## 2016-10-18 DIAGNOSIS — E039 Hypothyroidism, unspecified: Secondary | ICD-10-CM | POA: Diagnosis not present

## 2016-10-18 DIAGNOSIS — M6530 Trigger finger, unspecified finger: Secondary | ICD-10-CM | POA: Diagnosis not present

## 2016-10-18 DIAGNOSIS — Z6838 Body mass index (BMI) 38.0-38.9, adult: Secondary | ICD-10-CM | POA: Diagnosis not present

## 2016-10-18 DIAGNOSIS — R109 Unspecified abdominal pain: Secondary | ICD-10-CM | POA: Diagnosis not present

## 2016-10-18 DIAGNOSIS — K589 Irritable bowel syndrome without diarrhea: Secondary | ICD-10-CM | POA: Diagnosis not present

## 2016-10-18 DIAGNOSIS — N183 Chronic kidney disease, stage 3 (moderate): Secondary | ICD-10-CM | POA: Diagnosis not present

## 2016-10-18 DIAGNOSIS — Z6821 Body mass index (BMI) 21.0-21.9, adult: Secondary | ICD-10-CM | POA: Diagnosis not present

## 2016-10-18 DIAGNOSIS — E1122 Type 2 diabetes mellitus with diabetic chronic kidney disease: Secondary | ICD-10-CM | POA: Diagnosis not present

## 2016-10-18 DIAGNOSIS — Z299 Encounter for prophylactic measures, unspecified: Secondary | ICD-10-CM | POA: Diagnosis not present

## 2016-10-18 DIAGNOSIS — I10 Essential (primary) hypertension: Secondary | ICD-10-CM | POA: Diagnosis not present

## 2016-10-18 DIAGNOSIS — Z713 Dietary counseling and surveillance: Secondary | ICD-10-CM | POA: Diagnosis not present

## 2016-10-23 DIAGNOSIS — E119 Type 2 diabetes mellitus without complications: Secondary | ICD-10-CM | POA: Diagnosis not present

## 2016-10-23 DIAGNOSIS — M159 Polyosteoarthritis, unspecified: Secondary | ICD-10-CM | POA: Diagnosis not present

## 2016-10-23 DIAGNOSIS — I10 Essential (primary) hypertension: Secondary | ICD-10-CM | POA: Diagnosis not present

## 2016-10-24 ENCOUNTER — Other Ambulatory Visit: Payer: Self-pay

## 2016-10-24 ENCOUNTER — Encounter: Payer: Self-pay | Admitting: Gastroenterology

## 2016-10-24 ENCOUNTER — Ambulatory Visit (INDEPENDENT_AMBULATORY_CARE_PROVIDER_SITE_OTHER): Payer: Medicare Other | Admitting: Gastroenterology

## 2016-10-24 DIAGNOSIS — R109 Unspecified abdominal pain: Secondary | ICD-10-CM

## 2016-10-24 DIAGNOSIS — R1011 Right upper quadrant pain: Secondary | ICD-10-CM | POA: Diagnosis not present

## 2016-10-24 DIAGNOSIS — Z800 Family history of malignant neoplasm of digestive organs: Secondary | ICD-10-CM

## 2016-10-24 DIAGNOSIS — R197 Diarrhea, unspecified: Secondary | ICD-10-CM | POA: Insufficient documentation

## 2016-10-24 NOTE — Progress Notes (Signed)
Labs has been faxed over to the labs for her go by tomorrow

## 2016-10-24 NOTE — Assessment & Plan Note (Signed)
56 year old female presenting with acute on chronic abdominal pain associated with diarrhea. Patient describes recent onset severe right upper quadrant pain associated with nausea. Reported normal abdominal ultrasound, HIDA scan with normal gallbladder ejection fraction but reported reproduction of symptoms. Chronically with postprandial stools, abdominal cramping although previously in frequent and now seems to be more progressive.  On exam her pain is predominantly the entire right abdomen. This makes biliary less likely at least as a sole etiology of her symptoms. Differential diagnosis quite broad including IBS, IBD, appendicitis, potential peptic ulcer disease for upper abdominal symptoms, diabetic gastroparesis and/or enteropathy.  Discussed at length with patient, given pain and on exam, consider CT abdomen pelvis, check labs for celiac. Based on findings may consider EGD colonoscopy is next step.

## 2016-10-24 NOTE — Progress Notes (Signed)
Pt is set up for CT scan in the morning @ 8:30 am. She is aware and she will go by the hospital to pick up contrast today.

## 2016-10-24 NOTE — Progress Notes (Signed)
Labs has been faxed she that she can have

## 2016-10-24 NOTE — Progress Notes (Signed)
PT is aware. Said she will do the labs tomorrow. CT scheduled for 11/06/2016.

## 2016-10-24 NOTE — Patient Instructions (Signed)
1. We will contact you later today once I have reviewed your records and let you know about further labs and next step in your work up.

## 2016-10-24 NOTE — Progress Notes (Addendum)
REVIEWED-NO ADDITIONAL RECOMMENDATIONS.  Primary Care Physician:  Monico Blitz, MD  Primary Gastroenterologist:  Barney Drain, MD   Chief Complaint  Patient presents with  . Abdominal Pain    RUQ, tender, getting worse  . Nausea  . Diarrhea  . Constipation    HPI:  Jodi Ray is a 56 y.o. female here At the request of PCP for further evaluation of right upper quadrant abdominal pain associated with nausea, diarrhea. Patient states recently she developed severe right upper quadrant pain. Describes as incredibly excruciating. Associated with nausea. Felt lightheaded due to the pain. Saw nurse practitioner with Dr. Trena Platt and was sent for abdominal ultrasound the next day. States it was normal. Also had HIDA scan with normal gallbladder ejection fraction. According to the report she had right upper quadrant pain with ingestion of Ensure. Patient states she saw the surgeon, Dr. Ladona Horns. She states she was told that taking the gallbladder out may or may not help her symptoms as she did not clearly have abnormalities on ultrasound and HIDA. She was initially scheduled for surgery, stating the choice was left up to her. She subsequently saw her Dr. Manuella Ghazi who did not feel that her symptoms are gallbladder related. Patient was also evaluated in the ED due to abnormal glucose of 600 in the setting of pain. Those records have been requested.  Pain started on March 14 or 15th. Somewhat better at this time, not as severe. Pain is in the right upper quadrant and radiates up and throughout the rest of her abdomen but predominantly on the right side. Definitely postprandial. Patient states she also has a 4 year history of abdominal cramping associated with loose stools, in the past been quite mild and only occasional. The symptoms have progressed over the last week as well. Reports inability to control her sugar during these episodes. She is on insulin pump for the last one year. Feels like alien is trying to  come out of her abdomen. Gets nauseated. No nocturnal symptoms. Takes trazodone to sleep. Generally has a bowel movement 3 times a day about 2 hours after meals. No melena or rectal bleeding. Denies any upper GI symptoms such as heartburn, dysphagia.  She was given Bentyl in the ER, helps about "3%".  States she is due for colonoscopy next year, last one was 5 years ago. Her mother died of colon cancer at age 42. Patient has a personal history colon polyps. Multiple cancers throughout her family as outlined under family history.  Reviewed records received: Seen in ED 10/13/2016 due to right upper quadrant pain and glucose of 600.  HIDA scan on 10/13/2016 with gallbladder ejection fraction is 68% patient reported right upper quadrant pain following Ensure ingestion.  Current Outpatient Prescriptions  Medication Sig Dispense Refill  . aspirin EC 81 MG tablet Take 81 mg by mouth daily.    . Cholecalciferol (VITAMIN D-3) 5000 units TABS Take 1 tablet by mouth daily.    Marland Kitchen dicyclomine (BENTYL) 10 MG capsule Take 10 mg by mouth 4 (four) times daily -  before meals and at bedtime.    . Elastic Bandages & Supports (WRIST SPLINT/COCK-UP/LEFT L) MISC 1 each by Does not apply route as directed. 1 each 0  . Elastic Bandages & Supports (WRIST SPLINT/COCK-UP/RIGHT L) MISC 1 each by Does not apply route as directed. 1 each 0  . Fluocinolone Acetonide (DERMOTIC) 0.01 % OIL Place in ear(s). 2 drops each ear at bedtime    . gabapentin (NEURONTIN) 300 MG capsule Take  400 mg by mouth 3 (three) times daily.   0  . gabapentin (NEURONTIN) 800 MG tablet Take 800 mg by mouth daily.     . hydrochlorothiazide (MICROZIDE) 12.5 MG capsule Take 12.5 mg by mouth daily.    Marland Kitchen levothyroxine (SYNTHROID, LEVOTHROID) 112 MCG tablet Take 1 tablet (112 mcg total) by mouth daily. (Patient taking differently: Take 125 mcg by mouth daily. Per patient dose is now 125 mcg by mouth daily in the am) 30 tablet 2  . lisinopril  (PRINIVIL,ZESTRIL) 10 MG tablet take 1 tablet by mouth once daily 30 tablet 2  . loratadine (CLARITIN) 10 MG tablet Take 10 mg by mouth daily.    . Multiple Vitamin (MULTIVITAMIN) tablet Take 1 tablet by mouth daily.    Marland Kitchen omeprazole (PRILOSEC) 40 MG capsule Take 40 mg by mouth daily.    Marland Kitchen topiramate (TOPAMAX) 50 MG tablet Take 1 tablet (50 mg total) by mouth daily. 90 tablet 3  . triazolam (HALCION) 0.25 MG tablet Take 0.25 mg by mouth at bedtime as needed for sleep.    Marland Kitchen UNABLE TO FIND Insulin pump    . B-D ULTRAFINE III SHORT PEN 31G X 8 MM MISC 4 (four) times daily.  0   No current facility-administered medications for this visit.     Allergies as of 10/24/2016 - Review Complete 10/24/2016  Allergen Reaction Noted  . Clonidine derivatives  08/03/2014  . Other  08/04/2015    Past Medical History:  Diagnosis Date  . 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 Diabetes mellitus without complication ([place])   . Headache   . HTN (hypertension)   . Hypothyroidism   . Neuropathy ([place])   . Obesity   . OSA on CPAP 03/10/2015  . Retinopathy   . Sleep apnea   . Varicose veins of both lower extremities     Past Surgical History:  Procedure Laterality Date  . FOOT SURGERY    . LEG SURGERY Right   . REPLACEMENT TOTAL KNEE Right   . SPINAL CORD STIMULATOR IMPLANT      Family History  Problem Relation Age of Onset  . Cancer Mother 77    colon  . Cancer Father     renal cell  . Cancer Brother     bladder  . Cancer Sister     primary brain, and primary breast too  . Cancer Sister     breast   . Cancer Other     multiple aunts with breast, cervical, or colon cancer    Social History   Social History  . Marital status: Unknown    Spouse name: N/A  . Number of children: N/A  . Years of education: N/A   Occupational History  . Not on file.   Social History Main Topics  . Smoking status: Never Smoker  . Smokeless tobacco: Never Used  . Alcohol use No  . Drug use: No  . Sexual activity: Yes     Partners: Male   Other Topics Concern  . Not on file   Social History Narrative   Drinks about 1-2 cups of caffeine daily.      ROS:  General: Negative for anorexia, weight loss, fever, chills, fatigue, weakness. Eyes: Negative for vision changes.  ENT: Negative for hoarseness, difficulty swallowing , nasal congestion. CV: Negative for chest pain, angina, palpitations, dyspnea on exertion, peripheral edema.  Respiratory: Negative for dyspnea at rest, dyspnea on exertion, cough, sputum, wheezing.  GI: See history of present  illness. GU:  Negative for dysuria, hematuria, urinary incontinence, urinary frequency, nocturnal urination.  MS: + for joint pain, low back pain.  Derm: Negative for rash or itching.  Neuro: Negative for weakness, abnormal sensation, seizure, frequent headaches, memory loss, confusion.  Psych: Negative for anxiety, depression, suicidal ideation, hallucinations.  Endo: Negative for unusual weight change.  Heme: Negative for bruising or bleeding. Allergy: Negative for rash or hives.    Physical Examination:  BP 139/80   Pulse 81   Temp 97.6 F (36.4 C) (Oral)   Ht 5\' 9"  (1.753 m)   Wt 249 lb 9.6 oz (113.2 kg)   BMI 36.86 kg/m    General: Well-nourished, well-developed in no acute distress.  Head: Normocephalic, atraumatic.   Eyes: Conjunctiva pink, no icterus. Mouth: Oropharyngeal mucosa moist and pink , no lesions erythema or exudate. Neck: Supple without thyromegaly, masses, or lymphadenopathy.  Lungs: Clear to auscultation bilaterally.  Heart: Regular rate and rhythm, no murmurs rubs or gallops.  Abdomen: Bowel sounds are normal, moderate tenderness in ruq extended into rlq with palpation and subjective guarding. nondistended, no hepatosplenomegaly or masses, no abdominal bruits or hernia , no rebound.   Rectal: not performed Extremities: No lower extremity edema. No clubbing or deformities.  Neuro: Alert and oriented x 4 , grossly normal  neurologically.  Skin: Warm and dry, no rash or jaundice.   Psych: Alert and cooperative, normal mood and affect.  Labs: Labs from 10/13/2016 at [facility] blood cell count 7500, hemoglobin 12.8, hematocrit 38.5, platelets 270,000, total bilirubin 0.2, alkaline phosphatase 84, AST 12.2, ALT 14, albumin 4, BUN 40, creatinine 0.99  Imaging Studies: No results found.

## 2016-10-24 NOTE — Progress Notes (Signed)
cc'ed to pcp °

## 2016-10-24 NOTE — Progress Notes (Signed)
Please let patient know I reviewed her records.  We need the following:  Labs for TTG, Iga and IgA level.  CT A/P with contrast: last Creatinine was 0.99 on 10/13/16. Needs to be done within 24 hours.

## 2016-10-25 ENCOUNTER — Ambulatory Visit (HOSPITAL_COMMUNITY)
Admission: RE | Admit: 2016-10-25 | Discharge: 2016-10-25 | Disposition: A | Discharge status: Home / Self Care | Payer: Medicare Other | Source: Ambulatory Visit | Attending: Gastroenterology | Admitting: Gastroenterology

## 2016-10-25 DIAGNOSIS — K449 Diaphragmatic hernia without obstruction or gangrene: Secondary | ICD-10-CM | POA: Insufficient documentation

## 2016-10-25 DIAGNOSIS — R911 Solitary pulmonary nodule: Secondary | ICD-10-CM | POA: Diagnosis not present

## 2016-10-25 DIAGNOSIS — R109 Unspecified abdominal pain: Secondary | ICD-10-CM | POA: Diagnosis not present

## 2016-10-25 DIAGNOSIS — R1011 Right upper quadrant pain: Secondary | ICD-10-CM | POA: Insufficient documentation

## 2016-10-25 DIAGNOSIS — I700 Atherosclerosis of aorta: Secondary | ICD-10-CM | POA: Insufficient documentation

## 2016-10-25 LAB — POCT I-STAT CREATININE: CREATININE: 1 mg/dL (ref 0.44–1.00)

## 2016-10-25 MED ORDER — IOPAMIDOL (ISOVUE-300) INJECTION 61%: 100.0000 mL | Freq: Once | INTRAVENOUS | Status: DC | PRN

## 2016-10-25 NOTE — Progress Notes (Signed)
abd u/s results from 10/11/16 received. gb mildly distended but otherwise normal. Hepatic steatosis, patient noted to be tender at the gb during u/s scan as well as throughout the ruq.

## 2016-10-25 NOTE — Progress Notes (Signed)
Pt is aware of results and recommendations. OK to schedule procedures.  Jodi Ray, please forward CT report to PCP. Pt is aware to follow up with him about the left lower lobe nodule.

## 2016-10-25 NOTE — Progress Notes (Signed)
Please let patient know she has 8X75mm nodular area in the left lower lobe, she will need to see PCP for further recommendations. Per radiology they recommend at minimum non-contrast chest CT in 6-12 months. SEND COPY TO PCP AND LET PATIENT KNOW TO FOLLOW UP WITH THEM.   She has fair amount of stool through out the colon. Possibly related to bentyl slowly motility. She complained of 3 loose stools daily, chronically has diarrhea. STOP THE BENTYL  PROCEED WITH TCS/EGD (DX:RUQ PAIN, RIGHT SIDED ABD PAIN, WORSENING DIARRHEA, H/O COLON POLYPS, FH OF CRC IN OR (POLYPHARMACY) WITH SLF.   Day or prep and morning of procedure: patient has insulin pump, likely needs to adjust basal insulin by 1/2 but she can ask dr. Chalmers Cater for recommendations.

## 2016-10-26 ENCOUNTER — Other Ambulatory Visit: Payer: Self-pay

## 2016-10-26 DIAGNOSIS — R109 Unspecified abdominal pain: Secondary | ICD-10-CM

## 2016-10-26 DIAGNOSIS — R197 Diarrhea, unspecified: Secondary | ICD-10-CM

## 2016-10-26 DIAGNOSIS — Z86010 Personal history of colonic polyps: Secondary | ICD-10-CM

## 2016-10-26 DIAGNOSIS — R1011 Right upper quadrant pain: Secondary | ICD-10-CM

## 2016-10-26 LAB — IGA: IGA: 250 mg/dL (ref 81–463)

## 2016-10-26 LAB — TISSUE TRANSGLUTAMINASE, IGA: Tissue Transglutaminase Ab, IgA: 1 U/mL (ref <4)

## 2016-10-26 MED ORDER — PEG 3350-KCL-NA BICARB-NACL 420 G PO SOLR
4000.0000 mL | ORAL | 0 refills | Status: DC
Start: 2016-10-26 — End: 2016-11-14

## 2016-10-29 DIAGNOSIS — Z9289 Personal history of other medical treatment: Secondary | ICD-10-CM

## 2016-10-29 HISTORY — DX: Personal history of other medical treatment: Z92.89

## 2016-10-30 DIAGNOSIS — E1142 Type 2 diabetes mellitus with diabetic polyneuropathy: Secondary | ICD-10-CM | POA: Diagnosis not present

## 2016-10-30 DIAGNOSIS — Z713 Dietary counseling and surveillance: Secondary | ICD-10-CM | POA: Diagnosis not present

## 2016-10-30 DIAGNOSIS — R109 Unspecified abdominal pain: Secondary | ICD-10-CM | POA: Diagnosis not present

## 2016-10-30 DIAGNOSIS — E1165 Type 2 diabetes mellitus with hyperglycemia: Secondary | ICD-10-CM | POA: Diagnosis not present

## 2016-10-30 DIAGNOSIS — R911 Solitary pulmonary nodule: Secondary | ICD-10-CM | POA: Diagnosis not present

## 2016-10-30 DIAGNOSIS — Z6838 Body mass index (BMI) 38.0-38.9, adult: Secondary | ICD-10-CM | POA: Diagnosis not present

## 2016-10-30 DIAGNOSIS — I10 Essential (primary) hypertension: Secondary | ICD-10-CM | POA: Diagnosis not present

## 2016-10-30 DIAGNOSIS — Z299 Encounter for prophylactic measures, unspecified: Secondary | ICD-10-CM | POA: Diagnosis not present

## 2016-10-30 DIAGNOSIS — E039 Hypothyroidism, unspecified: Secondary | ICD-10-CM | POA: Diagnosis not present

## 2016-11-02 DIAGNOSIS — R918 Other nonspecific abnormal finding of lung field: Secondary | ICD-10-CM | POA: Diagnosis not present

## 2016-11-02 DIAGNOSIS — I700 Atherosclerosis of aorta: Secondary | ICD-10-CM | POA: Diagnosis not present

## 2016-11-06 ENCOUNTER — Ambulatory Visit (HOSPITAL_COMMUNITY): Payer: Medicare Other

## 2016-11-06 NOTE — Progress Notes (Signed)
Celiac screen negative. egd/tcs as scheduled.

## 2016-11-07 NOTE — Patient Instructions (Addendum)
Jodi Ray  11/07/2016     @PREFPERIOPPHARMACY @   Your procedure is scheduled on  11/14/2016   Report to [facility] at  27  A.M.  Call this number if you have problems the morning of surgery:  [phone]   Remember:  Do not eat food or drink liquids after midnight.  Take these medicines the morning of surgery with A SIP OF WATER  neurontin, microzide, levothyroxine, lisinopril, claritin, prilosec.   DO NOT take any medications for diabetes the morning of your procedure.  FOLLOW INSTRUCTIONS GIVEN TO YOU FROM ENDOCRINOLOGIST REGARDING MANAGEMENT OF INSULIN PUMP   Do not wear jewelry, make-up or nail polish.  Do not wear lotions, powders, or perfumes, or deoderant.  Do not shave 48 hours prior to surgery.  Men may shave face and neck.  Do not bring valuables to the hospital.  [facility] is not responsible for any belongings or valuables.  Contacts, dentures or bridgework may not be worn into surgery.  Leave your suitcase in the car.  After surgery it may be brought to your room.  For patients admitted to the hospital, discharge time will be determined by your treatment team.  Patients discharged the day of surgery will not be allowed to drive home.   Name and phone number of your driver:   Family   Special instructions:  Follow the diet and prep instructions given to you by Dr Nona Dell office.  Please read over the following fact sheets that you were given. Anesthesia Post-op Instructions and Care and Recovery After Surgery       Esophagogastroduodenoscopy Esophagogastroduodenoscopy (EGD) is a procedure to examine the lining of the esophagus, stomach, and first part of the small intestine (duodenum). This procedure is done to check for problems such as inflammation, bleeding, ulcers, or growths. During this procedure, a long, flexible, lighted tube with a camera attached (endoscope) is inserted down the throat. Tell a health care provider  about:  Any allergies you have.  All medicines you are taking, including vitamins, herbs, eye drops, creams, and over-the-counter medicines.  Any problems you or family members have had with anesthetic medicines.  Any blood disorders you have.  Any surgeries you have had.  Any medical conditions you have.  Whether you are pregnant or may be pregnant. What are the risks? Generally, this is a safe procedure. However, problems may occur, including:  Infection.  Bleeding.  A tear (perforation) in the esophagus, stomach, or duodenum.  Trouble breathing.  Excessive sweating.  Spasms of the larynx.  A slowed heartbeat.  Low blood pressure. What happens before the procedure?  Follow instructions from your health care provider about eating or drinking restrictions.  Ask your health care provider about:  Changing or stopping your regular medicines. This is especially important if you are taking diabetes medicines or blood thinners.  Taking medicines such as aspirin and ibuprofen. These medicines can thin your blood. Do not take these medicines before your procedure if your health care provider instructs you not to.  Plan to have someone take you home after the procedure.  If you wear dentures, be ready to remove them before the procedure. What happens during the procedure?  To reduce your risk of infection, your health care team will wash or sanitize their hands.  An IV tube will be put in a vein in your hand or arm. You will get medicines and fluids through  this tube.  You will be given one or more of the following:  A medicine to help you relax (sedative).  A medicine to numb the area (local anesthetic). This medicine may be sprayed into your throat. It will make you feel more comfortable and keep you from gagging or coughing during the procedure.  A medicine for pain.  A mouth guard may be placed in your mouth to protect your teeth and to keep you from biting on  the endoscope.  You will be asked to lie on your left side.  The endoscope will be lowered down your throat into your esophagus, stomach, and duodenum.  Air will be put into the endoscope. This will help your health care provider see better.  The lining of your esophagus, stomach, and duodenum will be examined.  Your health care provider may:  Take a tissue sample so it can be looked at in a lab (biopsy).  Remove growths.  Remove objects (foreign bodies) that are stuck.  Treat any bleeding with medicines or other devices that stop tissue from bleeding.  Widen (dilate) or stretch narrowed areas of your esophagus and stomach.  The endoscope will be taken out. The procedure may vary among health care providers and hospitals. What happens after the procedure?  Your blood pressure, heart rate, breathing rate, and blood oxygen level will be monitored often until the medicines you were given have worn off.  Do not eat or drink anything until the numbing medicine has worn off and your gag reflex has returned. This information is not intended to replace advice given to you by your health care provider. Make sure you discuss any questions you have with your health care provider. Document Released: 11/17/2004 Document Revised: 12/23/2015 Document Reviewed: 06/10/2015 Elsevier Interactive Patient Education  2017 [place]. Esophagogastroduodenoscopy, Care After Refer to this sheet in the next few weeks. These instructions provide you with information about caring for yourself after your procedure. Your health care provider may also give you more specific instructions. Your treatment has been planned according to current medical practices, but problems sometimes occur. Call your health care provider if you have any problems or questions after your procedure. What can I expect after the procedure? After the procedure, it is common to have:  A sore  throat.  Nausea.  Bloating.  Dizziness.  Fatigue. Follow these instructions at home:  Do not eat or drink anything until the numbing medicine (local anesthetic) has worn off and your gag reflex has returned. You will know that the local anesthetic has worn off when you can swallow comfortably.  Do not drive for 24 hours if you received a medicine to help you relax (sedative).  If your health care provider took a tissue sample for testing during the procedure, make sure to get your test results. This is your responsibility. Ask your health care provider or the department performing the test when your results will be ready.  Keep all follow-up visits as told by your health care provider. This is important. Contact a health care provider if:  You cannot stop coughing.  You are not urinating.  You are urinating less than usual. Get help right away if:  You have trouble swallowing.  You cannot eat or drink.  You have throat or chest pain that gets worse.  You are dizzy or light-headed.  You faint.  You have nausea or vomiting.  You have chills.  You have a fever.  You have severe abdominal pain.  You have black, tarry, or bloody stools. This information is not intended to replace advice given to you by your health care provider. Make sure you discuss any questions you have with your health care provider. Document Released: 07/03/2012 Document Revised: 12/23/2015 Document Reviewed: 06/10/2015 Elsevier Interactive Patient Education  2017 [place].  Colonoscopy, Adult A colonoscopy is an exam to look at the entire large intestine. During the exam, a lubricated, bendable tube is inserted into the anus and then passed into the rectum, colon, and other parts of the large intestine. A colonoscopy is often done as a part of normal colorectal screening or in response to certain symptoms, such as anemia, persistent diarrhea, abdominal pain, and blood in the stool. The exam  can help screen for and diagnose medical problems, including:  Tumors.  Polyps.  Inflammation.  Areas of bleeding. Tell a health care provider about:  Any allergies you have.  All medicines you are taking, including vitamins, herbs, eye drops, creams, and over-the-counter medicines.  Any problems you or family members have had with anesthetic medicines.  Any blood disorders you have.  Any surgeries you have had.  Any medical conditions you have.  Any problems you have had passing stool. What are the risks? Generally, this is a safe procedure. However, problems may occur, including:  Bleeding.  A tear in the intestine.  A reaction to medicines given during the exam.  Infection (rare). What happens before the procedure? Eating and drinking restrictions  Follow instructions from your health care provider about eating and drinking, which may include:  A few days before the procedure - follow a low-fiber diet. Avoid nuts, seeds, dried fruit, raw fruits, and vegetables.  1-3 days before the procedure - follow a clear liquid diet. Drink only clear liquids, such as clear broth or bouillon, black coffee or tea, clear juice, clear soft drinks or sports drinks, gelatin dessert, and popsicles. Avoid any liquids that contain red or purple dye.  On the day of the procedure - do not eat or drink anything during the 2 hours before the procedure, or within the time period that your health care provider recommends. Bowel prep  If you were prescribed an oral bowel prep to clean out your colon:  Take it as told by your health care provider. Starting the day before your procedure, you will need to drink a large amount of medicated liquid. The liquid will cause you to have multiple loose stools until your stool is almost clear or light green.  If your skin or anus gets irritated from diarrhea, you may use these to relieve the irritation:  Medicated wipes, such as adult wet wipes with aloe  and vitamin E.  A skin soothing-product like petroleum jelly.  If you vomit while drinking the bowel prep, take a break for up to 60 minutes and then begin the bowel prep again. If vomiting continues and you cannot take the bowel prep without vomiting, call your health care provider. General instructions   Ask your health care provider about changing or stopping your regular medicines. This is especially important if you are taking diabetes medicines or blood thinners.  Plan to have someone take you home from the hospital or clinic. What happens during the procedure?  An IV tube may be inserted into one of your veins.  You will be given medicine to help you relax (sedative).  To reduce your risk of infection:  Your health care team will wash or sanitize their hands.  Your anal area will be washed with soap.  You will be asked to lie on your side with your knees bent.  Your health care provider will lubricate a long, thin, flexible tube. The tube will have a camera and a light on the end.  The tube will be inserted into your anus.  The tube will be gently eased through your rectum and colon.  Air will be delivered into your colon to keep it open. You may feel some pressure or cramping.  The camera will be used to take images during the procedure.  A small tissue sample may be removed from your body to be examined under a microscope (biopsy). If any potential problems are found, the tissue will be sent to a lab for testing.  If small polyps are found, your health care provider may remove them and have them checked for cancer cells.  The tube that was inserted into your anus will be slowly removed. The procedure may vary among health care providers and hospitals. What happens after the procedure?  Your blood pressure, heart rate, breathing rate, and blood oxygen level will be monitored until the medicines you were given have worn off.  Do not drive for 24 hours after the  exam.  You may have a small amount of blood in your stool.  You may pass gas and have mild abdominal cramping or bloating due to the air that was used to inflate your colon during the exam.  It is up to you to get the results of your procedure. Ask your health care provider, or the department performing the procedure, when your results will be ready. This information is not intended to replace advice given to you by your health care provider. Make sure you discuss any questions you have with your health care provider. Document Released: 07/14/2000 Document Revised: 05/17/2016 Document Reviewed: 09/28/2015 Elsevier Interactive Patient Education  2017 [employer]  Colonoscopy, Adult, Care After This sheet gives you information about how to care for yourself after your procedure. Your health care provider may also give you more specific instructions. If you have problems or questions, contact your health care provider. What can I expect after the procedure? After the procedure, it is common to have:  A small amount of blood in your stool for 24 hours after the procedure.  Some gas.  Mild abdominal cramping or bloating. Follow these instructions at home: General instructions    For the first 24 hours after the procedure:  Do not drive or use machinery.  Do not sign important documents.  Do not drink alcohol.  Do your regular daily activities at a slower pace than normal.  Eat soft, easy-to-digest foods.  Rest often.  Take over-the-counter or prescription medicines only as told by your health care provider.  It is up to you to get the results of your procedure. Ask your health care provider, or the department performing the procedure, when your results will be ready. Relieving cramping and bloating   Try walking around when you have cramps or feel bloated.  Apply heat to your abdomen as told by your health care provider. Use a heat source that your health care provider  recommends, such as a moist heat pack or a heating pad.  Place a towel between your skin and the heat source.  Leave the heat on for 20-30 minutes.  Remove the heat if your skin turns bright red. This is especially important if you are unable to feel pain, heat,  or cold. You may have a greater risk of getting burned. Eating and drinking   Drink enough fluid to keep your urine clear or pale yellow.  Resume your normal diet as instructed by your health care provider. Avoid heavy or fried foods that are hard to digest.  Avoid drinking alcohol for as long as instructed by your health care provider. Contact a health care provider if:  You have blood in your stool 2-3 days after the procedure. Get help right away if:  You have more than a small spotting of blood in your stool.  You pass large blood clots in your stool.  Your abdomen is swollen.  You have nausea or vomiting.  You have a fever.  You have increasing abdominal pain that is not relieved with medicine. This information is not intended to replace advice given to you by your health care provider. Make sure you discuss any questions you have with your health care provider. Document Released: 02/29/2004 Document Revised: 04/10/2016 Document Reviewed: 09/28/2015 Elsevier Interactive Patient Education  2017 [place], Care After These instructions provide you with information about caring for yourself after your procedure. Your health care provider may also give you more specific instructions. Your treatment has been planned according to current medical practices, but problems sometimes occur. Call your health care provider if you have any problems or questions after your procedure. What can I expect after the procedure? After your procedure, it is common to:  Feel sleepy for several hours.  Feel clumsy and have poor balance for several hours.  Feel forgetful about what happened after the  procedure.  Have poor judgment for several hours.  Feel nauseous or vomit.  Have a sore throat if you had a breathing tube during the procedure. Follow these instructions at home: For at least 24 hours after the procedure:    Do not:  Participate in activities in which you could fall or become injured.  Drive.  Use heavy machinery.  Drink alcohol.  Take sleeping pills or medicines that cause drowsiness.  Make important decisions or sign legal documents.  Take care of children on your own.  Rest. Eating and drinking   Follow the diet that is recommended by your health care provider.  If you vomit, drink water, juice, or soup when you can drink without vomiting.  Make sure you have little or no nausea before eating solid foods. General instructions   Have a responsible adult stay with you until you are awake and alert.  Take over-the-counter and prescription medicines only as told by your health care provider.  If you smoke, do not smoke without supervision.  Keep all follow-up visits as told by your health care provider. This is important. Contact a health care provider if:  You keep feeling nauseous or you keep vomiting.  You feel light-headed.  You develop a rash.  You have a fever. Get help right away if:  You have trouble breathing. This information is not intended to replace advice given to you by your health care provider. Make sure you discuss any questions you have with your health care provider. Document Released: 11/07/2015 Document Revised: 03/08/2016 Document Reviewed: 11/07/2015 Elsevier Interactive Patient Education  2017 [place], Care After These instructions provide you with information about caring for yourself after your procedure. Your health care provider may also give you more specific instructions. Your treatment has been planned according to current medical practices, but problems sometimes occur.  Call  your health care provider if you have any problems or questions after your procedure. What can I expect after the procedure? After your procedure, it is common to:  Feel sleepy for several hours.  Feel clumsy and have poor balance for several hours.  Feel forgetful about what happened after the procedure.  Have poor judgment for several hours.  Feel nauseous or vomit.  Have a sore throat if you had a breathing tube during the procedure. Follow these instructions at home: For at least 24 hours after the procedure:    Do not:  Participate in activities in which you could fall or become injured.  Drive.  Use heavy machinery.  Drink alcohol.  Take sleeping pills or medicines that cause drowsiness.  Make important decisions or sign legal documents.  Take care of children on your own.  Rest. Eating and drinking   Follow the diet that is recommended by your health care provider.  If you vomit, drink water, juice, or soup when you can drink without vomiting.  Make sure you have little or no nausea before eating solid foods. General instructions   Have a responsible adult stay with you until you are awake and alert.  Take over-the-counter and prescription medicines only as told by your health care provider.  If you smoke, do not smoke without supervision.  Keep all follow-up visits as told by your health care provider. This is important. Contact a health care provider if:  You keep feeling nauseous or you keep vomiting.  You feel light-headed.  You develop a rash.  You have a fever. Get help right away if:  You have trouble breathing. This information is not intended to replace advice given to you by your health care provider. Make sure you discuss any questions you have with your health care provider. Document Released: 11/07/2015 Document Revised: 03/08/2016 Document Reviewed: 11/07/2015 Elsevier Interactive Patient Education  2017 [employer].

## 2016-11-08 NOTE — Progress Notes (Signed)
PT is aware.

## 2016-11-09 ENCOUNTER — Telehealth: Payer: Self-pay | Admitting: Gastroenterology

## 2016-11-09 ENCOUNTER — Other Ambulatory Visit: Payer: Self-pay

## 2016-11-09 ENCOUNTER — Encounter (HOSPITAL_COMMUNITY)
Admission: RE | Admit: 2016-11-09 | Discharge: 2016-11-09 | Disposition: A | Discharge status: Home / Self Care | Payer: Medicare Other | Source: Ambulatory Visit | Attending: Gastroenterology | Admitting: Gastroenterology

## 2016-11-09 ENCOUNTER — Encounter (HOSPITAL_COMMUNITY): Payer: Self-pay

## 2016-11-09 DIAGNOSIS — Z01812 Encounter for preprocedural laboratory examination: Secondary | ICD-10-CM | POA: Insufficient documentation

## 2016-11-09 DIAGNOSIS — Z01810 Encounter for preprocedural cardiovascular examination: Secondary | ICD-10-CM | POA: Insufficient documentation

## 2016-11-09 DIAGNOSIS — R9431 Abnormal electrocardiogram [ECG] [EKG]: Secondary | ICD-10-CM

## 2016-11-09 HISTORY — DX: Myoneural disorder, unspecified: G70.9

## 2016-11-09 HISTORY — DX: Anemia, unspecified: D64.9

## 2016-11-09 LAB — BASIC METABOLIC PANEL
Anion gap: 7 (ref 5–15)
BUN: 41 mg/dL — AB (ref 6–20)
CO2: 28 mmol/L (ref 22–32)
CREATININE: 1.45 mg/dL — AB (ref 0.44–1.00)
Calcium: 9.4 mg/dL (ref 8.9–10.3)
Chloride: 101 mmol/L (ref 101–111)
GFR calc Af Amer: 45 mL/min — ABNORMAL LOW (ref >60)
GFR calc non Af Amer: 39 mL/min — ABNORMAL LOW (ref >60)
Glucose, Bld: 204 mg/dL — ABNORMAL HIGH (ref 65–99)
POTASSIUM: 5.3 mmol/L — AB (ref 3.5–5.1)
SODIUM: 136 mmol/L (ref 135–145)

## 2016-11-09 LAB — CBC WITH DIFFERENTIAL/PLATELET
Basophils Absolute: 0.1 10*3/uL (ref 0.0–0.1)
Basophils Relative: 1 %
EOS ABS: 0.1 10*3/uL (ref 0.0–0.7)
Eosinophils Relative: 2 %
HCT: 38.3 % (ref 36.0–46.0)
HEMOGLOBIN: 12.5 g/dL (ref 12.0–15.0)
LYMPHS ABS: 1.8 10*3/uL (ref 0.7–4.0)
LYMPHS PCT: 29 %
MCH: 29.1 pg (ref 26.0–34.0)
MCHC: 32.6 g/dL (ref 30.0–36.0)
MCV: 89.3 fL (ref 78.0–100.0)
MONOS PCT: 7 %
Monocytes Absolute: 0.5 10*3/uL (ref 0.1–1.0)
NEUTROS PCT: 61 %
Neutro Abs: 3.7 10*3/uL (ref 1.7–7.7)
Platelets: 239 10*3/uL (ref 150–400)
RBC: 4.29 MIL/uL (ref 3.87–5.11)
RDW: 14.4 % (ref 11.5–15.5)
WBC: 6.1 10*3/uL (ref 4.0–10.5)

## 2016-11-09 NOTE — Progress Notes (Signed)
PT is aware and will call now to schedule appt with PCP.

## 2016-11-09 NOTE — Pre-Procedure Instructions (Signed)
Has an insulin pump.  Per patient Endocrinologist advised her to set pump to 50 % of basal rate as soon as she starts liquids.

## 2016-11-09 NOTE — Telephone Encounter (Signed)
Referral has been made. Tried to call with no answer

## 2016-11-09 NOTE — Telephone Encounter (Signed)
PLEASE CALL PT. HER ECG WAS NOT NORMAL. IT WAS REVIEWED BY A CARDIOLOGIST. DR. BRANCH WOULD LIKE TO SEE YOU IN HIS OFFICE.

## 2016-11-10 ENCOUNTER — Encounter: Payer: Self-pay | Admitting: *Deleted

## 2016-11-10 ENCOUNTER — Encounter: Payer: Self-pay | Admitting: Cardiovascular Disease

## 2016-11-10 ENCOUNTER — Ambulatory Visit (INDEPENDENT_AMBULATORY_CARE_PROVIDER_SITE_OTHER): Payer: Medicare Other | Admitting: Cardiovascular Disease

## 2016-11-10 VITALS — BP 118/65 | HR 82 | Ht 69.0 in | Wt 256.0 lb

## 2016-11-10 DIAGNOSIS — I10 Essential (primary) hypertension: Secondary | ICD-10-CM

## 2016-11-10 DIAGNOSIS — E108 Type 1 diabetes mellitus with unspecified complications: Secondary | ICD-10-CM

## 2016-11-10 DIAGNOSIS — I447 Left bundle-branch block, unspecified: Secondary | ICD-10-CM | POA: Diagnosis not present

## 2016-11-10 DIAGNOSIS — N183 Chronic kidney disease, stage 3 unspecified: Secondary | ICD-10-CM

## 2016-11-10 NOTE — Patient Instructions (Signed)
Medication Instructions:   Your physician recommends that you continue on your current medications as directed. Please refer to the Current Medication list given to you today.  Labwork: NONE  Testing/Procedures: Your physician has requested that you have a lexiscan myoview. For further information please visit [email or web address]. Please follow instruction sheet, as given.   Follow-Up:  Your physician recommends that you schedule a follow-up appointment in: 6 weeks.  Any Other Special Instructions Will Be Listed Below (If Applicable).  If you need a refill on your cardiac medications before your next appointment, please call your pharmacy.

## 2016-11-10 NOTE — Progress Notes (Signed)
CARDIOLOGY CONSULT NOTE  Patient ID: Jodi Ray MRN: [id number] DOB/AGE: 58/04/1961 58 y.o.  Admit date: (Not on file) Primary Physician: Monico Blitz, MD Referring Physician: Manuella Ghazi  Reason for Consultation: LBBB  HPI: Jodi Ray is a 56 y.o. female who is being seen today for the evaluation of a LBBB at the request of Monico Blitz, MD.  She has a h/o IDDM, hypertension, and a LLL lung nodule.  ECG performed on 11/09/16 which I personally interpreted demonstrates normal sinus rhythm with a LBBB.  Labs 4/12: K elevated 5.3, BUN elevated 41, creatinine elevated 1.45.  She says she has no idea why she is here.  She has had type 1 diabetes since the age of 56. Her primary complaint recently has been right upper quadrant pain. She denies a history of gallbladder disease. She is seeing gastroenterology.  With regards to left bundle branch block, she denies chest pain, dizziness, and palpitations. She has shortness of breath only if she walks long distances.  She has sleep apnea and uses CPAP.  She has had surgeries on her right leg and has a spinal cord stimulator due to chronic pain related to this.  There is no family history of premature coronary artery disease.  She denies having had an ECG at her PCPs office. She said the last ECGs were done 8 years ago in [place] and she does not think she can obtain them.  Social history: She is originally from [place]. She used to be a Forensic psychologist. She knows [place] very well.     Allergies  Allergen Reactions  . Fentanyl Other (See Comments)    Nausea, vomiting, loss of consciousness, requiring reversal    Current Outpatient Prescriptions  Medication Sig Dispense Refill  . aspirin EC 81 MG tablet Take 81 mg by mouth daily.    . B-D ULTRAFINE III SHORT PEN 31G X 8 MM MISC 4 (four) times daily.  0  . Cholecalciferol (VITAMIN D-3) 5000 units TABS Take 5,000 Units by mouth daily.     . Elastic  Bandages & Supports (WRIST SPLINT/COCK-UP/LEFT L) MISC 1 each by Does not apply route as directed. 1 each 0  . Elastic Bandages & Supports (WRIST SPLINT/COCK-UP/RIGHT L) MISC 1 each by Does not apply route as directed. 1 each 0  . Fluocinolone Acetonide (DERMOTIC) 0.01 % OIL Place 1 drop into both ears at bedtime.     . gabapentin (NEURONTIN) 400 MG capsule Take 400 mg by mouth daily.    Marland Kitchen gabapentin (NEURONTIN) 800 MG tablet Take 800 mg by mouth 2 (two) times daily.     . hydrochlorothiazide (MICROZIDE) 12.5 MG capsule Take 12.5 mg by mouth daily.    . Insulin Human (INSULIN PUMP) SOLN Inject into the skin continuous. Novolog    . levothyroxine (SYNTHROID, LEVOTHROID) 125 MCG tablet Take 125 mcg by mouth daily before breakfast.   0  . lisinopril (PRINIVIL,ZESTRIL) 10 MG tablet take 1 tablet by mouth once daily 30 tablet 2  . loratadine (CLARITIN) 10 MG tablet Take 10 mg by mouth daily.    . metFORMIN (GLUCOPHAGE) 500 MG tablet Take 500 mg by mouth 2 (two) times daily with a meal.    . Multiple Vitamins-Minerals (CENTRUM SILVER 50+WOMEN) TABS Take 1 tablet by mouth daily.     . Omega-3 Fatty Acids (FISH OIL) 1000 MG CAPS Take 1,000 mg by mouth daily.    Marland Kitchen omeprazole (PRILOSEC) 40 MG capsule Take 40 mg by  mouth daily.    . polyethylene glycol-electrolytes (TRILYTE) 420 g solution Take 4,000 mLs by mouth as directed. (Patient taking differently: Take 4,000 mLs by mouth as directed. ) 4000 mL 0  . topiramate (TOPAMAX) 50 MG tablet Take 1 tablet (50 mg total) by mouth daily. (Patient taking differently: Take 50 mg by mouth at bedtime. ) 90 tablet 3  . triazolam (HALCION) 0.25 MG tablet Take 0.25 mg by mouth at bedtime.      No current facility-administered medications for this visit.     Past Medical History:  Diagnosis Date  . 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 Anemia   . Brain aneurysm    frontal lobe  . Diabetes mellitus without complication ([facility])   . Headache   . HTN (hypertension)   . Hypothyroidism    . Neuromuscular disorder ([facility])   . Neuropathy ([facility])   . Obesity   . OSA on CPAP 03/10/2015  . Retinopathy   . Sleep apnea   . Varicose veins of both lower extremities     Past Surgical History:  Procedure Laterality Date  . BREAST SURGERY Left 1994   Lumpectomy  . FOOT SURGERY    . LEG SURGERY Right   . REPLACEMENT TOTAL KNEE Right   . SPINAL CORD STIMULATOR IMPLANT      Social History   Social History  . Marital status: Widowed    Spouse name: N/A  . Number of children: N/A  . Years of education: N/A   Occupational History  . Not on file.   Social History Main Topics  . Smoking status: Never Smoker  . Smokeless tobacco: Never Used  . Alcohol use No  . Drug use: No  . Sexual activity: Yes    Partners: Male   Other Topics Concern  . Not on file   Social History Narrative   Drinks about 1-2 cups of caffeine daily.     No family history of premature CAD in 1st degree relatives.  Current Meds  Medication Sig  . aspirin EC 81 MG tablet Take 81 mg by mouth daily.  . B-D ULTRAFINE III SHORT PEN 31G X 8 MM MISC 4 (four) times daily.  . Cholecalciferol (VITAMIN D-3) 5000 units TABS Take 5,000 Units by mouth daily.   . Elastic Bandages & Supports (WRIST SPLINT/COCK-UP/LEFT L) MISC 1 each by Does not apply route as directed.  . Elastic Bandages & Supports (WRIST SPLINT/COCK-UP/RIGHT L) MISC 1 each by Does not apply route as directed.  . Fluocinolone Acetonide (DERMOTIC) 0.01 % OIL Place 1 drop into both ears at bedtime.   . gabapentin (NEURONTIN) 400 MG capsule Take 400 mg by mouth daily.  Marland Kitchen gabapentin (NEURONTIN) 800 MG tablet Take 800 mg by mouth 2 (two) times daily.   . hydrochlorothiazide (MICROZIDE) 12.5 MG capsule Take 12.5 mg by mouth daily.  . Insulin Human (INSULIN PUMP) SOLN Inject into the skin continuous. Novolog  . levothyroxine (SYNTHROID, LEVOTHROID) 125 MCG tablet Take 125 mcg by mouth daily before breakfast.   . lisinopril (PRINIVIL,ZESTRIL) 10 MG  tablet take 1 tablet by mouth once daily  . loratadine (CLARITIN) 10 MG tablet Take 10 mg by mouth daily.  . metFORMIN (GLUCOPHAGE) 500 MG tablet Take 500 mg by mouth 2 (two) times daily with a meal.  . Multiple Vitamins-Minerals (CENTRUM SILVER 50+WOMEN) TABS Take 1 tablet by mouth daily.   . Omega-3 Fatty Acids (FISH OIL) 1000 MG CAPS Take 1,000 mg by mouth daily.  Marland Kitchen  omeprazole (PRILOSEC) 40 MG capsule Take 40 mg by mouth daily.  . polyethylene glycol-electrolytes (TRILYTE) 420 g solution Take 4,000 mLs by mouth as directed. (Patient taking differently: Take 4,000 mLs by mouth as directed. )  . topiramate (TOPAMAX) 50 MG tablet Take 1 tablet (50 mg total) by mouth daily. (Patient taking differently: Take 50 mg by mouth at bedtime. )  . triazolam (HALCION) 0.25 MG tablet Take 0.25 mg by mouth at bedtime.       Review of systems complete and found to be negative unless listed above in HPI    Physical exam Blood pressure 118/65, pulse 82, height 5\' 9"  (1.753 m), weight 256 lb (116.1 kg). General: NAD Neck: No JVD, no thyromegaly or thyroid nodule.  Lungs: Clear to auscultation bilaterally with normal respiratory effort. CV: Nondisplaced PMI. Regular rate and rhythm, normal S1/S2, no S3/S4, no murmur.  No peripheral edema.  No carotid bruit.    Abdomen: Soft, nontender, obese.  Skin: Intact without lesions or rashes.  Neurologic: Alert and oriented x 3.  Psych: Normal affect. Extremities: No clubbing or cyanosis.  HEENT: Normal.   ECG: Most recent ECG reviewed.   Labs: Lab Results  Component Value Date/Time   K 5.3 (H) 11/09/2016 10:35 AM   BUN 41 (H) 11/09/2016 10:35 AM   CREATININE 1.45 (H) 11/09/2016 10:35 AM   CREATININE 0.98 07/23/2015 07:07 AM   TSH 1.990 07/23/2015 07:07 AM   HGB 12.5 11/09/2016 10:35 AM     Lipids: No results found for: LDLCALC, LDLDIRECT, CHOL, TRIG, HDL      ASSESSMENT AND PLAN:  1. LBBB: She is asymptomatic. However, she has additional risk  factors for CAD which include long-standing type 1 diabetes as well as hypertension. She denies having had an ECG at her PCPs office. She said the last ECGs were done 8 years ago in [place] and she does not think she can obtain them. Given the strong correIation of LBBB with CAD, I will proceed with a nuclear myocardial perfusion imaging study to evaluate for ischemic heart disease (Lexiscan).  2. Hypertension: Controlled. No changes to therapy.  3. Type 1 diabetes: On long-term insulin.   Disposition: Follow up in 6 weeks.  Signed: Kate Sable, M.D., F.A.C.C.  11/10/2016, 1:35 PM

## 2016-11-13 ENCOUNTER — Telehealth: Payer: Self-pay

## 2016-11-13 NOTE — Telephone Encounter (Signed)
Tried to call, phone would not ring. Mailing a letter of the info.

## 2016-11-13 NOTE — Telephone Encounter (Signed)
Pt called to let us know that she saw Dr. Jacinta Shoe on 11/10/2016. She is on for her procedures tomorrow.

## 2016-11-13 NOTE — Telephone Encounter (Signed)
OK TO PROCEED WITH ENDOSCOPY. PT NOT HAVING UNSTABLE ANGINA.

## 2016-11-14 ENCOUNTER — Encounter (HOSPITAL_COMMUNITY)
Admission: RE | Disposition: A | Discharge status: Home / Self Care | Payer: Self-pay | Source: Ambulatory Visit | Attending: Gastroenterology

## 2016-11-14 ENCOUNTER — Ambulatory Visit (HOSPITAL_COMMUNITY): Payer: Medicare Other | Admitting: Anesthesiology

## 2016-11-14 ENCOUNTER — Ambulatory Visit (HOSPITAL_COMMUNITY)
Admission: RE | Admit: 2016-11-14 | Discharge: 2016-11-14 | Disposition: A | Discharge status: Home / Self Care | Payer: Medicare Other | Source: Ambulatory Visit | Attending: Gastroenterology | Admitting: Gastroenterology

## 2016-11-14 ENCOUNTER — Encounter (HOSPITAL_COMMUNITY): Payer: Self-pay | Admitting: *Deleted

## 2016-11-14 DIAGNOSIS — R1011 Right upper quadrant pain: Secondary | ICD-10-CM | POA: Diagnosis not present

## 2016-11-14 DIAGNOSIS — Q438 Other specified congenital malformations of intestine: Secondary | ICD-10-CM | POA: Insufficient documentation

## 2016-11-14 DIAGNOSIS — E669 Obesity, unspecified: Secondary | ICD-10-CM | POA: Diagnosis not present

## 2016-11-14 DIAGNOSIS — E039 Hypothyroidism, unspecified: Secondary | ICD-10-CM | POA: Insufficient documentation

## 2016-11-14 DIAGNOSIS — K2970 Gastritis, unspecified, without bleeding: Secondary | ICD-10-CM

## 2016-11-14 DIAGNOSIS — K648 Other hemorrhoids: Secondary | ICD-10-CM | POA: Diagnosis not present

## 2016-11-14 DIAGNOSIS — R197 Diarrhea, unspecified: Secondary | ICD-10-CM | POA: Insufficient documentation

## 2016-11-14 DIAGNOSIS — Z6837 Body mass index (BMI) 37.0-37.9, adult: Secondary | ICD-10-CM | POA: Insufficient documentation

## 2016-11-14 DIAGNOSIS — E11319 Type 2 diabetes mellitus with unspecified diabetic retinopathy without macular edema: Secondary | ICD-10-CM | POA: Insufficient documentation

## 2016-11-14 DIAGNOSIS — I10 Essential (primary) hypertension: Secondary | ICD-10-CM | POA: Diagnosis not present

## 2016-11-14 DIAGNOSIS — Z86010 Personal history of colonic polyps: Secondary | ICD-10-CM | POA: Diagnosis not present

## 2016-11-14 DIAGNOSIS — E1140 Type 2 diabetes mellitus with diabetic neuropathy, unspecified: Secondary | ICD-10-CM | POA: Diagnosis not present

## 2016-11-14 DIAGNOSIS — G4730 Sleep apnea, unspecified: Secondary | ICD-10-CM | POA: Diagnosis not present

## 2016-11-14 DIAGNOSIS — Z800 Family history of malignant neoplasm of digestive organs: Secondary | ICD-10-CM | POA: Insufficient documentation

## 2016-11-14 DIAGNOSIS — Z79899 Other long term (current) drug therapy: Secondary | ICD-10-CM | POA: Insufficient documentation

## 2016-11-14 DIAGNOSIS — Z96651 Presence of right artificial knee joint: Secondary | ICD-10-CM | POA: Diagnosis not present

## 2016-11-14 DIAGNOSIS — Z794 Long term (current) use of insulin: Secondary | ICD-10-CM | POA: Diagnosis not present

## 2016-11-14 DIAGNOSIS — R1031 Right lower quadrant pain: Secondary | ICD-10-CM | POA: Diagnosis not present

## 2016-11-14 DIAGNOSIS — Z7982 Long term (current) use of aspirin: Secondary | ICD-10-CM | POA: Diagnosis not present

## 2016-11-14 DIAGNOSIS — E1151 Type 2 diabetes mellitus with diabetic peripheral angiopathy without gangrene: Secondary | ICD-10-CM | POA: Insufficient documentation

## 2016-11-14 DIAGNOSIS — R110 Nausea: Secondary | ICD-10-CM | POA: Insufficient documentation

## 2016-11-14 DIAGNOSIS — G4733 Obstructive sleep apnea (adult) (pediatric): Secondary | ICD-10-CM | POA: Diagnosis not present

## 2016-11-14 DIAGNOSIS — R109 Unspecified abdominal pain: Secondary | ICD-10-CM

## 2016-11-14 DIAGNOSIS — K317 Polyp of stomach and duodenum: Secondary | ICD-10-CM | POA: Diagnosis not present

## 2016-11-14 HISTORY — PX: BIOPSY: SHX5522

## 2016-11-14 HISTORY — PX: COLONOSCOPY WITH PROPOFOL: SHX5780

## 2016-11-14 HISTORY — PX: ESOPHAGOGASTRODUODENOSCOPY (EGD) WITH PROPOFOL: SHX5813

## 2016-11-14 LAB — GLUCOSE, CAPILLARY
Glucose-Capillary: 158 mg/dL — ABNORMAL HIGH (ref 65–99)
Glucose-Capillary: 148 mg/dL — ABNORMAL HIGH (ref 65–99)

## 2016-11-14 SURGERY — COLONOSCOPY WITH PROPOFOL
Anesthesia: Monitor Anesthesia Care

## 2016-11-14 MED ORDER — MIDAZOLAM HCL 2 MG/2ML IJ SOLN
INTRAMUSCULAR | Status: AC
  Filled 2016-11-14: qty 2

## 2016-11-14 MED ORDER — LIDOCAINE HCL (CARDIAC) 10 MG/ML IV SOLN
INTRAVENOUS | Status: DC | PRN
  Administered 2016-11-14: 40 mg via INTRAVENOUS

## 2016-11-14 MED ORDER — CHLORHEXIDINE GLUCONATE CLOTH 2 % EX PADS: 6.0000 | MEDICATED_PAD | Freq: Once | CUTANEOUS | Status: DC

## 2016-11-14 MED ORDER — LIDOCAINE VISCOUS 2 % MT SOLN
OROMUCOSAL | Status: AC
  Filled 2016-11-14: qty 15

## 2016-11-14 MED ORDER — PROPOFOL 10 MG/ML IV BOLUS
INTRAVENOUS | Status: AC
  Filled 2016-11-14: qty 20

## 2016-11-14 MED ORDER — LACTATED RINGERS IV SOLN
INTRAVENOUS | Status: DC
  Administered 2016-11-14: 1000 mL via INTRAVENOUS

## 2016-11-14 MED ORDER — PROPOFOL 10 MG/ML IV BOLUS
INTRAVENOUS | Status: AC
  Filled 2016-11-14: qty 60

## 2016-11-14 MED ORDER — LIDOCAINE HCL (PF) 1 % IJ SOLN
INTRAMUSCULAR | Status: AC
  Filled 2016-11-14: qty 5

## 2016-11-14 MED ORDER — LIDOCAINE VISCOUS 2 % MT SOLN
3.0000 mL | OROMUCOSAL | Status: DC | PRN
  Administered 2016-11-14: 3 mL via OROMUCOSAL

## 2016-11-14 MED ORDER — MIDAZOLAM HCL 2 MG/2ML IJ SOLN
1.0000 mg | INTRAMUSCULAR | Status: AC
  Administered 2016-11-14: 2 mg via INTRAVENOUS

## 2016-11-14 MED ORDER — LACTATED RINGERS IV SOLN
INTRAVENOUS | Status: DC | PRN
  Administered 2016-11-14 (×2): via INTRAVENOUS

## 2016-11-14 MED ORDER — PROPOFOL 10 MG/ML IV BOLUS
INTRAVENOUS | Status: AC
  Filled 2016-11-14: qty 40

## 2016-11-14 MED ORDER — PROPOFOL 500 MG/50ML IV EMUL
INTRAVENOUS | Status: DC | PRN
  Administered 2016-11-14 (×2): via INTRAVENOUS
  Administered 2016-11-14: 150 ug/kg/min via INTRAVENOUS

## 2016-11-14 MED ORDER — HYDROMORPHONE HCL 1 MG/ML IJ SOLN: 0.5000 mg | Freq: Once | INTRAMUSCULAR | Status: DC

## 2016-11-14 MED ORDER — PROPOFOL 10 MG/ML IV BOLUS
INTRAVENOUS | Status: DC | PRN
  Administered 2016-11-14: 75 mg via INTRAVENOUS

## 2016-11-14 NOTE — H&P (Signed)
Primary Care Physician:  Monico Blitz, MD Primary Gastroenterologist:  Dr. Oneida Alar  Pre-Procedure History & Physical: HPI:  Jodi Ray is a 56 y.o. female here for ABDOMINAL  PAIN/LOOSE STOOLS.  Past Medical History:  Diagnosis Date  . 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 Anemia   . Brain aneurysm    frontal lobe  . Diabetes mellitus without complication ([facility])   . Headache   . HTN (hypertension)   . Hypothyroidism   . Neuromuscular disorder ([facility])   . Neuropathy   . Obesity   . OSA on CPAP 03/10/2015  . Retinopathy   . Sleep apnea   . Varicose veins of both lower extremities     Past Surgical History:  Procedure Laterality Date  . BREAST SURGERY Left 1994   Lumpectomy  . FOOT SURGERY    . LEG SURGERY Right   . REPLACEMENT TOTAL KNEE Right   . SPINAL CORD STIMULATOR IMPLANT      Prior to Admission medications   Medication Sig Start Date End Date Taking? Authorizing Provider  aspirin EC 81 MG tablet Take 81 mg by mouth daily.   Yes Historical Provider, MD  B-D ULTRAFINE III SHORT PEN 31G X 8 MM MISC 4 (four) times daily. 01/28/15  Yes Historical Provider, MD  Cholecalciferol (VITAMIN D-3) 5000 units TABS Take 5,000 Units by mouth daily.    Yes Historical Provider, MD  Elastic Bandages & Supports (WRIST SPLINT/COCK-UP/LEFT L) MISC 1 each by Does not apply route as directed. 01/12/16  Yes Pieter Partridge, DO  Elastic Bandages & Supports (WRIST SPLINT/COCK-UP/RIGHT L) MISC 1 each by Does not apply route as directed. 01/12/16  Yes Adam Telford Nab, DO  Fluocinolone Acetonide (DERMOTIC) 0.01 % OIL Place 1 drop into both ears at bedtime.    Yes Historical Provider, MD  gabapentin (NEURONTIN) 400 MG capsule Take 400 mg by mouth daily.   Yes Historical Provider, MD  gabapentin (NEURONTIN) 800 MG tablet Take 800 mg by mouth 2 (two) times daily.    Yes Historical Provider, MD  hydrochlorothiazide (MICROZIDE) 12.5 MG capsule Take 12.5 mg by mouth daily.   Yes Historical Provider, MD  Insulin Human (INSULIN  PUMP) SOLN Inject into the skin continuous. Novolog   Yes Historical Provider, MD  levothyroxine (SYNTHROID, LEVOTHROID) 125 MCG tablet Take 125 mcg by mouth daily before breakfast.  11/01/16  Yes Historical Provider, MD  lisinopril (PRINIVIL,ZESTRIL) 10 MG tablet take 1 tablet by mouth once daily 07/28/15  Yes Cassandria Anger, MD  loratadine (CLARITIN) 10 MG tablet Take 10 mg by mouth daily.   Yes Historical Provider, MD  metFORMIN (GLUCOPHAGE) 500 MG tablet Take 500 mg by mouth 2 (two) times daily with a meal.   Yes Historical Provider, MD  Multiple Vitamins-Minerals (CENTRUM SILVER 50+WOMEN) TABS Take 1 tablet by mouth daily.    Yes Historical Provider, MD  Omega-3 Fatty Acids (FISH OIL) 1000 MG CAPS Take 1,000 mg by mouth daily.   Yes Historical Provider, MD  omeprazole (PRILOSEC) 40 MG capsule Take 40 mg by mouth daily.   Yes Historical Provider, MD  polyethylene glycol-electrolytes (TRILYTE) 420 g solution Take 4,000 mLs by mouth as directed. Patient taking differently: Take 4,000 mLs by mouth as directed.  10/26/16  Yes Danie Binder, MD  topiramate (TOPAMAX) 50 MG tablet Take 1 tablet (50 mg total) by mouth daily. Patient taking differently: Take 50 mg by mouth at bedtime.  01/12/16  Yes Adam Telford Nab, DO  triazolam (HALCION) 0.25  MG tablet Take 0.25 mg by mouth at bedtime.    Yes Historical Provider, MD    Allergies as of 10/26/2016 - Review Complete 10/25/2016  Allergen Reaction Noted  . Clonidine derivatives  08/03/2014  . Other  08/04/2015    Family History  Problem Relation Age of Onset  . Cancer Mother 87    colon  . Cancer Father     renal cell  . Cancer Brother     bladder  . Cancer Sister     primary brain, and primary breast too  . Cancer Sister     breast   . Cancer Other     multiple aunts with breast, cervical, or colon cancer    Social History   Social History  . Marital status: Widowed    Spouse name: N/A  . Number of children: N/A  . Years of  education: N/A   Occupational History  . Not on file.   Social History Main Topics  . Smoking status: Never Smoker  . Smokeless tobacco: Never Used  . Alcohol use No  . Drug use: No  . Sexual activity: Yes    Partners: Male   Other Topics Concern  . Not on file   Social History Narrative   Drinks about 1-2 cups of caffeine daily.    Review of Systems: See HPI, otherwise negative ROS   Physical Exam: BP (!) 148/68   Temp 97.7 F (36.5 C) (Oral)   SpO2 98%  General:   Alert,  pleasant and cooperative in NAD Head:  Normocephalic and atraumatic. Neck:  Supple; Lungs:  Clear throughout to auscultation.    Heart:  Regular rate and rhythm. Abdomen:  Soft, nontender and nondistended. Normal bowel sounds, without guarding, and without rebound.   Neurologic:  Alert and  oriented x4;  grossly normal neurologically.  Impression/Plan:     ABDOMINAL  PAIN/LOOSE STOOLS   PLAN: 1. TCS/EGD TODAY-BIOPSY COLON, STOMACH, & DUODENUM. DISCUSSED PROCEDURE, BENEFITS, & RISKS: < 1% chance of medication reaction, bleeding, perforation, or rupture of spleen/liver.

## 2016-11-14 NOTE — Discharge Instructions (Signed)
YOU DID NOT HAVE ANY POLYPS. You have internal hemorrhoids. You have BENIGN STOMACH POLYPS. YOUR RIGHT UPPER ABDOMINAL PAIN IS MOST LIKELY DUE TO gastritis. NO SOURCE FOR YOUR LOWER ABDOMINAL PAIN OR INTERMITTENT LOOSE STOOLS WAS IDENTIFIED. THE LAST PART OF YOUR SMALL BOWEL IS NORMAL. I biopsied your stomach, SMALL BOWEL, & colon.   See your PRIMARY DOCTOR ABOUT YOUR KIDNEY FUNCTION AND POTASSIUM.  CONTINUE YOUR WEIGHT LOSS EFFORTS. LOSE 10 TO 20 LBS. YOU SHOULD TRANSITION TO A PLANT BASED DIET-NO MEAT OR DAIRY for 6 mos. I RECOMMEND THE BOOK, "PREVENT AND REVERSE HEART DISEASE", CALDWELL ESSELTYN JR., MD. PAGE 120-121 CLEARLY STATE THE RULES AND QUICK AND EASY RECIPES FOR BREAKFAST, LUNCH, AND DINNER ARE AFTER P 127.   FOLLOW A HIGH FIBER/DAIRY FREE DIET. AVOID ITEMS THAT CAUSE BLOATING. SEE INFO BELOW.  TAKE OMEPRAZOLE 30 MINUTES PRIOR TO MEALS TWICE DAILY.  YOUR BIOPSY RESULTS WILL BE AVAILABLE IN MY CHART AFTER APR 20 AND MY OFFICE WILL CONTACT YOU IN 10-14 DAYS WITH YOUR RESULTS.    FOLLOW UP IN 4 MOS.   Next colonoscopy IN 5 YEARS.   ENDOSCOPY Care After Read the instructions outlined below and refer to this sheet in the next week. These discharge instructions provide you with general information on caring for yourself after you leave the hospital. While your treatment has been planned according to the most current medical practices available, unavoidable complications occasionally occur. If you have any problems or questions after discharge, call DR. Paco Cislo, [phone].  ACTIVITY  You may resume your regular activity, but move at a slower pace for the next 24 hours.   Take frequent rest periods for the next 24 hours.   Walking will help get rid of the air and reduce the bloated feeling in your belly (abdomen).   No driving for 24 hours (because of the medicine (anesthesia) used during the test).   You may shower.   Do not sign any important legal documents or operate any  machinery for 24 hours (because of the anesthesia used during the test).    NUTRITION  Drink plenty of fluids.   You may resume your normal diet as instructed by your doctor.   Begin with a light meal and progress to your normal diet. Heavy or fried foods are harder to digest and may make you feel sick to your stomach (nauseated).   Avoid alcoholic beverages for 24 hours or as instructed.    MEDICATIONS  You may resume your normal medications.   WHAT YOU CAN EXPECT TODAY  Some feelings of bloating in the abdomen.   Passage of more gas than usual.   Spotting of blood in your stool or on the toilet paper  .  IF YOU HAD POLYPS REMOVED DURING THE ENDOSCOPY:  Eat a soft diet IF YOU HAVE NAUSEA, BLOATING, ABDOMINAL PAIN, OR VOMITING.    FINDING OUT THE RESULTS OF YOUR TEST Not all test results are available during your visit. DR. Oneida Alar WILL CALL YOU WITHIN 14 DAYS OF YOUR PROCEDUE WITH YOUR RESULTS. Do not assume everything is normal if you have not heard from DR. Marcille Barman, CALL HER OFFICE AT [phone].  SEEK IMMEDIATE MEDICAL ATTENTION AND CALL THE OFFICE: [phone] IF:  You have more than a spotting of blood in your stool.   Your belly is swollen (abdominal distention).   You are nauseated or vomiting.   You have a temperature over 101F.   You have abdominal pain or discomfort that is severe  or gets worse throughout the day.   Gastritis  Gastritis is an inflammation (the body's way of reacting to injury and/or infection) of the stomach. It is often caused by viral or bacterial (germ) infections. It can also be caused BY ASPIRIN, BC/GOODY POWDER'S, (IBUPROFEN) MOTRIN, OR ALEVE (NAPROXEN), chemicals (including alcohol), SPICY FOODS, and medications. This illness may be associated with generalized malaise (feeling tired, not well), UPPER ABDOMINAL STOMACH cramps, and fever. One common bacterial cause of gastritis is an organism known as H. Pylori. This can be treated  with antibiotics.     High-Fiber Diet A high-fiber diet changes your normal diet to include more whole grains, legumes, fruits, and vegetables. Changes in the diet involve replacing refined carbohydrates with unrefined foods. The calorie level of the diet is essentially unchanged. The Dietary Reference Intake (recommended amount) for adult males is 38 grams per day. For adult females, it is 25 grams per day. Pregnant and lactating women should consume 28 grams of fiber per day. Fiber is the intact part of a plant that is not broken down during digestion. Functional fiber is fiber that has been isolated from the plant to provide a beneficial effect in the body. PURPOSE  Increase stool bulk.   Ease and regulate bowel movements.   Lower cholesterol.   REDUCE RISK OF COLON CANCER  INDICATIONS THAT YOU NEED MORE FIBER  Constipation and hemorrhoids.   Uncomplicated diverticulosis (intestine condition) and irritable bowel syndrome.   Weight management.   As a protective measure against hardening of the arteries (atherosclerosis), diabetes, and cancer.   GUIDELINES FOR INCREASING FIBER IN THE DIET  Start adding fiber to the diet slowly. A gradual increase of about 5 more grams (2 slices of whole-wheat bread, 2 servings of most fruits or vegetables, or 1 bowl of high-fiber cereal) per day is best. Too rapid an increase in fiber may result in constipation, flatulence, and bloating.   Drink enough water and fluids to keep your urine clear or pale yellow. Water, juice, or caffeine-free drinks are recommended. Not drinking enough fluid may cause constipation.   Eat a variety of high-fiber foods rather than one type of fiber.   Try to increase your intake of fiber through using high-fiber foods rather than fiber pills or supplements that contain small amounts of fiber.   The goal is to change the types of food eaten. Do not supplement your present diet with high-fiber foods, but replace foods  in your present diet.   INCLUDE A VARIETY OF FIBER SOURCES  Replace refined and processed grains with whole grains, canned fruits with fresh fruits, and incorporate other fiber sources. White rice, white breads, and most bakery goods contain little or no fiber.   Brown whole-grain rice, buckwheat oats, and many fruits and vegetables are all good sources of fiber. These include: broccoli, Brussels sprouts, cabbage, cauliflower, beets, sweet potatoes, white potatoes (skin on), carrots, tomatoes, eggplant, squash, berries, fresh fruits, and dried fruits.   Cereals appear to be the richest source of fiber. Cereal fiber is found in whole grains and bran. Bran is the fiber-rich outer coat of cereal grain, which is largely removed in refining. In whole-grain cereals, the bran remains. In breakfast cereals, the largest amount of fiber is found in those with "bran" in their names. The fiber content is sometimes indicated on the label.   You may need to include additional fruits and vegetables each day.   In baking, for 1 cup white flour, you may use  the following substitutions:   1 cup whole-wheat flour minus 2 tablespoons.   1/2 cup white flour plus 1/2 cup whole-wheat flour.    Lactose Free Diet Lactose is a carbohydrate that is found mainly in milk and milk products, as well as in foods with added milk or whey. Lactose must be digested by the enzyme in order to be used by the body. Lactose intolerance occurs when there is a shortage of lactase.When your body is not able to digest lactose, you may feel sick to your stomach (nausea), bloating, cramping, gas and diarrhea.  There are many dairy products that may be tolerated better than milk by some people:  The use of cultured dairy products such as yogurt, buttermilk, cottage cheese, and sweet acidophilus milk (Kefir) for lactase-deficient individuals is usually well tolerated. This is because the healthy bacteria help digest lactose.    Lactose-hydrolyzed milk (Lactaid) contains 40-90% less lactose than milk and may also be well tolerated.    SPECIAL NOTES  Lactose is a carbohydrates. The major food source is dairy products. Reading food labels is important. Many products contain lactose even when they are not made from milk. Look for the following words: whey, milk solids, dry milk solids, nonfat dry milk powder. Typical sources of lactose other than dairy products include breads, candies, cold cuts, prepared and processed foods, and commercial sauces and gravies.   All foods must be prepared without milk, cream, or other dairy foods.   Soy milk and lactose-free supplements (LACTASE) may be used as an alternative to milk.   FOOD GROUP ALLOWED/RECOMMENDED AVOID/USE SPARINGLY  BREADS / STARCHES 4 servings or more* Breads and rolls made without milk. [place], [place], or [place] bread. Breads and rolls that contain milk. Prepared mixes such as muffins, biscuits, waffles, pancakes. Sweet rolls, donuts, [place] toast (if made with milk or lactose).  Crackers: Soda crackers, graham crackers. Any crackers prepared without lactose. Zwieback crackers, corn curls, or any that contain lactose.  Cereals: Cooked or dry cereals prepared without lactose (read labels). Cooked or dry cereals prepared with lactose (read labels). Total, Cocoa Krispies. Special K.  Potatoes / Pasta / Rice: Any prepared without milk or lactose. Popcorn. Instant potatoes, frozen [place] fries, scalloped or au gratin potatoes.  VEGETABLES 2 servings or more Fresh, frozen, and canned vegetables. Creamed or breaded vegetables. Vegetables in a cheese sauce or with lactose-containing margarines.  FRUIT 2 servings or more All fresh, canned, or frozen fruits that are not processed with lactose. Any canned or frozen fruits processed with lactose.  MEAT & SUBSTITUTES 2 servings or more (4 to 6 oz. total per day) Plain beef, chicken, fish, [place], lamb, veal, pork, or  ham. Kosher prepared meat products. Strained or junior meats that do not contain milk. Eggs, soy meat substitutes, nuts. Scrambled eggs, omelets, and souffles that contain milk. Creamed or breaded meat, fish, or fowl. Sausage products such as wieners, liver sausage, or cold cuts that contain milk solids. Cheese, cottage cheese, or cheese spreads.  MILK None. (See BEVERAGES for milk substitutes. See DESSERTS for ice cream and frozen desserts.) Milk (whole, 2%, skim, or chocolate). Evaporated, powdered, or condensed milk; malted milk.  SOUPS & COMBINATION FOODS Bouillon, broth, vegetable soups, clear soups, consomms. Homemade soups made with allowed ingredients. Combination or prepared foods that do not contain milk or milk products (read labels). Cream soups, chowders, commercially prepared soups containing lactose. Macaroni and cheese, pizza. Combination or prepared foods that contain milk or milk products.  DESSERTS &  SWEETS In moderation Water and fruit ices; gelatin; angel food cake. Homemade cookies, pies, or cakes made from allowed ingredients. Pudding (if made with water or a milk substitute). Lactose-free tofu desserts. Sugar, honey, corn syrup, jam, jelly; marmalade; molasses (beet sugar); Pure sugar candy; marshmallows. Ice cream, ice milk, sherbet, custard, pudding, frozen yogurt. Commercial cake and cookie mixes. Desserts that contain chocolate. Pie crust made with milk-containing margarine; reduced-calorie desserts made with a sugar substitute that contains lactose. Toffee, peppermint, butterscotch, chocolate, caramels.  FATS & OILS In moderation Butter (as tolerated; contains very small amounts of lactose). Margarines and dressings that do not contain milk, Vegetable oils, shortening, Miracle Whip, mayonnaise, nondairy cream & whipped toppings without lactose or milk solids added (examples: Coffee Rich, Carnation Coffeemate, Rich's Whipped Topping, PolyRich). Berniece Salines. Margarines and salad  dressings containing milk; cream, cream cheese; peanut butter with added milk solids, sour cream, chip dips, made with sour cream.  BEVERAGES Carbonated drinks; tea; coffee and freeze-dried coffee; some instant coffees (check labels). Fruit drinks; fruit and vegetable juice; Rice or Soy milk. Ovaltine, hot chocolate. Some cocoas; some instant coffees; instant iced teas; powdered fruit drinks (read labels).   CONDIMENTS / MISCELLANEOUS Soy sauce, carob powder, olives, gravy made with water, baker's cocoa, pickles, pure seasonings and spices, wine, pure monosodium glutamate, catsup, mustard. Some chewing gums, chocolate, some cocoas. Certain antibiotics and vitamin / mineral preparations. Spice blends if they contain milk products. MSG extender. Artificial sweeteners that contain lactose such as Equal (Nutra-Sweet) and Sweet 'n Low. Some nondairy creamers (read labels).   SAMPLE MENU*  Breakfast   Orange Juice.  Banana.   Bran flakes.   Nondairy Creamer.  [place] Bread (toasted).   Butter or milk-free margarine.   Coffee or tea.    Noon Meal   Chicken Breast.  Rice.   Green beans.   Butter or milk-free margarine.  Fresh melon.   Coffee or tea.    Evening Meal   Roast Beef.  Baked potato.   Butter or milk-free margarine.   Broccoli.   Lettuce salad with vinegar and oil dressing.  [employer]   Coffee or tea.      Low-Fat Diet BREADS, CEREALS, PASTA, RICE, DRIED PEAS, AND BEANS These products are high in carbohydrates and most are low in fat. Therefore, they can be increased in the diet as substitutes for fatty foods. They too, however, contain calories and should not be eaten in excess. Cereals can be eaten for snacks as well as for breakfast.  Include foods that contain fiber (fruits, vegetables, whole grains, and legumes). Research shows that fiber may lower blood cholesterol levels, especially the water-soluble fiber found in fruits, vegetables, oat  products, and legumes. FRUITS AND VEGETABLES It is good to eat fruits and vegetables. Besides being sources of fiber, both are rich in vitamins and some minerals. They help you get the daily allowances of these nutrients. Fruits and vegetables can be used for snacks and desserts. MEATS Limit lean meat, chicken, [place], and fish to no more than 6 ounces per day. Beef, Pork, and Lamb Use lean cuts of beef, pork, and lamb. Lean cuts include:  Extra-lean ground beef.  Arm roast.  Sirloin tip.  Center-cut ham.  Round steak.  Loin chops.  Rump roast.  Tenderloin.  Trim all fat off the outside of meats before cooking. It is not necessary to severely decrease the intake of red meat, but lean choices should be made. Lean meat is rich in protein  and contains a highly absorbable form of iron. Premenopausal women, in particular, should avoid reducing lean red meat because this could increase the risk for low red blood cells (iron-deficiency anemia). The organ meats, such as liver, sweetbreads, kidneys, and brain are very rich in cholesterol. They should be limited. Chicken and [place] These are good sources of protein. The fat of poultry can be reduced by removing the skin and underlying fat layers before cooking. Chicken and [place] can be substituted for lean red meat in the diet. Poultry should not be fried or covered with high-fat sauces. Fish and Shellfish Fish is a good source of protein. Shellfish contain cholesterol, but they usually are low in saturated fatty acids. The preparation of fish is important. Like chicken and [place], they should not be fried or covered with high-fat sauces. EGGS Egg whites contain no fat or cholesterol. They can be eaten often. Try 1 to 2 egg whites instead of whole eggs in recipes or use egg substitutes that do not contain yolk. MILK AND DAIRY PRODUCTS Use skim or 1% milk instead of 2% or whole milk. Decrease whole milk, natural, and processed cheeses. Use nonfat or  low-fat (2%) cottage cheese or low-fat cheeses made from vegetable oils. Choose nonfat or low-fat (1 to 2%) yogurt. Experiment with evaporated skim milk in recipes that call for heavy cream. Substitute low-fat yogurt or low-fat cottage cheese for sour cream in dips and salad dressings. Have at least 2 servings of low-fat dairy products, such as 2 glasses of skim (or 1%) milk each day to help get your daily calcium intake.  FATS AND OILS Reduce the total intake of fats, especially saturated fat. Butterfat, lard, and beef fats are high in saturated fat and cholesterol. These should be avoided as much as possible. Vegetable fats do not contain cholesterol, but certain vegetable fats, such as coconut oil, palm oil, and palm kernel oil are very high in saturated fats. These should be limited. These fats are often used in bakery goods, processed foods, popcorn, oils, and nondairy creamers. Vegetable shortenings and some peanut butters contain hydrogenated oils, which are also saturated fats. Read the labels on these foods and check for saturated vegetable oils. Unsaturated vegetable oils and fats do not raise blood cholesterol. However, they should be limited because they are fats and are high in calories. Total fat should still be limited to 30% of your daily caloric intake. Desirable liquid vegetable oils are corn oil, cottonseed oil, olive oil, canola oil, safflower oil, soybean oil, and sunflower oil. Peanut oil is not as good, but small amounts are acceptable. Buy a heart-healthy tub margarine that has no partially hydrogenated oils in the ingredients. Mayonnaise and salad dressings often are made from unsaturated fats, but they should also be limited because of their high calorie and fat content. Seeds, nuts, peanut butter, olives, and avocados are high in fat, but the fat is mainly the unsaturated type. These foods should be limited mainly to avoid excess calories and fat. OTHER EATING TIPS Snacks  Most  sweets should be limited as snacks. They tend to be rich in calories and fats, and their caloric content outweighs their nutritional value. Some good choices in snacks are graham crackers, melba toast, soda crackers, bagels (no egg), English muffins, fruits, and vegetables. These snacks are preferable to snack crackers, [place] fries, and chips. Popcorn should be air-popped or cooked in small amounts of liquid vegetable oil. Desserts Eat fruit, low-fat yogurt, and fruit ices. AVOID pastries, cake,  and cookies. Sherbet, angel food cake, gelatin dessert, frozen low-fat yogurt, or other frozen products that do not contain saturated fat (pure fruit juice bars, frozen ice pops) are also acceptable.  COOKING METHODS Choose those methods that use little or no fat. They include: Poaching.  Braising.  Steaming.  Grilling.  Baking.  Stir-frying.  Broiling.  Microwaving.  Foods can be cooked in a nonstick pan without added fat, or use a nonfat cooking spray in regular cookware. Limit fried foods and avoid frying in saturated fat. Add moisture to lean meats by using water, broth, cooking wines, and other nonfat or low-fat sauces along with the cooking methods mentioned above. Soups and stews should be chilled after cooking. The fat that forms on top after a few hours in the refrigerator should be skimmed off. When preparing meals, avoid using excess salt. Salt can contribute to raising blood pressure in some people. EATING AWAY FROM HOME Order entres, potatoes, and vegetables without sauces or butter. When meat exceeds the size of a deck of cards (3 to 4 ounces), the rest can be taken home for another meal. Choose vegetable or fruit salads and ask for low-calorie salad dressings to be served on the side. Use dressings sparingly. Limit high-fat toppings, such as bacon, crumbled eggs, cheese, sunflower seeds, and olives. Ask for heart-healthy tub margarine instead of butter.  Hemorrhoids Hemorrhoids are  dilated (enlarged) veins around the rectum. Sometimes clots will form in the veins. This makes them swollen and painful. These are called thrombosed hemorrhoids. Causes of hemorrhoids include:  Constipation.   Straining to have a bowel movement.   HEAVY LIFTING  HOME CARE INSTRUCTIONS  Eat a well balanced diet and drink 6 to 8 glasses of water every day to avoid constipation. You may also use a bulk laxative.   Avoid straining to have bowel movements.   Keep anal area dry and clean.   Do not use a donut shaped pillow or sit on the toilet for long periods. This increases blood pooling and pain.   Move your bowels when your body has the urge; this will require less straining and will decrease pain and pressure.

## 2016-11-14 NOTE — Anesthesia Preprocedure Evaluation (Addendum)
Anesthesia Evaluation  Patient identified by MRN, date of birth, ID band Patient awake    Reviewed: Allergy & Precautions, NPO status , Patient's Chart, lab work & pertinent test results  Airway Mallampati: III  TM Distance: >3 FB     Dental  (+) Teeth Intact, Implants,    Pulmonary sleep apnea and Continuous Positive Airway Pressure Ventilation ,    breath sounds clear to auscultation       Cardiovascular hypertension, Pt. on medications + Peripheral Vascular Disease   Rhythm:Regular Rate:Normal     Neuro/Psych  Headaches,  Neuromuscular disease    GI/Hepatic negative GI ROS,   Endo/Other  diabetes, Type 2Hypothyroidism   Renal/GU      Musculoskeletal   Abdominal   Peds  Hematology   Anesthesia Other Findings   Reproductive/Obstetrics                             Anesthesia Physical Anesthesia Plan  ASA: III  Anesthesia Plan: MAC   Post-op Pain Management:    Induction: Intravenous  Airway Management Planned: Simple Face Mask  Additional Equipment:   Intra-op Plan:   Post-operative Plan:   Informed Consent: I have reviewed the patients History and Physical, chart, labs and discussed the procedure including the risks, benefits and alternatives for the proposed anesthesia with the patient or authorized representative who has indicated his/her understanding and acceptance.     Plan Discussed with:   Anesthesia Plan Comments: (NOT allergic to fentanyl, she overdosed on patches.)       Anesthesia Quick Evaluation

## 2016-11-14 NOTE — Anesthesia Postprocedure Evaluation (Signed)
Anesthesia Post Note  Patient: Hartlyn Reigel  Procedure(s) Performed: Procedure(s) (LRB): COLONOSCOPY WITH PROPOFOL (N/A) ESOPHAGOGASTRODUODENOSCOPY (EGD) WITH PROPOFOL (N/A) BIOPSY  Patient location during evaluation: PACU Anesthesia Type: MAC Level of consciousness: awake and alert and oriented Pain management: pain level controlled Vital Signs Assessment: post-procedure vital signs reviewed and stable Respiratory status: spontaneous breathing Cardiovascular status: stable Postop Assessment: no signs of nausea or vomiting Anesthetic complications: no     Last Vitals:  Vitals:   11/14/16 0715 11/14/16 0730  BP:    Resp: (!) 31 (!) 34  Temp:      Last Pain:  Vitals:   11/14/16 0740  TempSrc:   PainSc: 5                  Kamori Barbier A

## 2016-11-14 NOTE — Anesthesia Procedure Notes (Signed)
Procedure Name: MAC Date/Time: 11/14/2016 7:36 AM Performed by: Andree Elk, Monea Pesantez A Pre-anesthesia Checklist: Patient identified, Emergency Drugs available, Suction available, Patient being monitored and Timeout performed Oxygen Delivery Method: Simple face mask

## 2016-11-14 NOTE — Op Note (Signed)
[facility] Patient Name: Jodi Ray Procedure Date: 11/14/2016 7:23 AM MRN: [id number] Date of Birth: Feb 20, 1961 Attending MD: Barney Drain , MD CSN: [id number] Age: 56 Admit Type: Outpatient Procedure:                Colonoscopy WITH COLD FORCEPS BIOPSY Indications:              Abdominal pain in the right lower quadrant,                            Clinically significant diarrhea of unexplained                            origin. MOTHER DIED OF COLON CANCER AGE < 49. Providers:                Barney Drain, MD, Jeanann Lewandowsky. Sharon Seller, RN, Rosina Lowenstein, RN Referring MD:              Medicines:                Propofol per Anesthesia Complications:            No immediate complications. Estimated Blood Loss:     Estimated blood loss was minimal. Procedure:                Pre-Anesthesia Assessment:                           - Prior to the procedure, a History and Physical                            was performed, and patient medications and                            allergies were reviewed. The patient's tolerance of                            previous anesthesia was also reviewed. The risks                            and benefits of the procedure and the sedation                            options and risks were discussed with the patient.                            All questions were answered, and informed consent                            was obtained. Prior Anticoagulants: The patient has                            taken aspirin, last dose was 1 day prior to  procedure. ASA Grade Assessment: II - A patient                            with mild systemic disease. After reviewing the                            risks and benefits, the patient was deemed in                            satisfactory condition to undergo the procedure.                            After obtaining informed consent, the colonoscope       was passed under direct vision. Throughout the                            procedure, the patient's blood pressure, pulse, and                            oxygen saturations were monitored continuously. The                            [id number] ([id number]) scope was introduced through                            the anus and advanced to the 10 cm into the ileum.                            The colonoscopy was technically difficult and                            complex due to a tortuous colon. Successful                            completion of the procedure was aided by COLOWRAP.                            The patient tolerated the procedure well. The                            quality of the bowel preparation was good. The                            terminal ileum, ileocecal valve, appendiceal                            orifice, and rectum were photographed. Scope In: 7:49:30 AM Scope Out: 8:08:00 AM Scope Withdrawal Time: 0 hours 12 minutes 41 seconds  Total Procedure Duration: 0 hours 18 minutes 30 seconds  Findings:      The terminal ileum appeared normal.      The recto-sigmoid colon and sigmoid colon were moderately redundant.       Biopsies for histology were taken with a cold forceps from the cecum,  ascending colon, transverse colon and descending colon for evaluation of       microscopic colitis.      Internal hemorrhoids were found during retroflexion. The hemorrhoids       were moderate. Impression:               - The examined portion of the ileum was normal.                           - Redundant LEFT colon.                           - Internal hemorrhoids. Moderate Sedation:      Per Anesthesia Care Recommendation:           - Repeat colonoscopy in 5 years for surveillance.                           - Return to GI office in 4 months.                           - High fiber diet and lactose free diet.                           - Continue present medications.                            - Await pathology results.                           - Patient has a contact number available for                            emergencies. The signs and symptoms of potential                            delayed complications were discussed with the                            patient. Return to normal activities tomorrow.                            Written discharge instructions were provided to the                            patient. Procedure Code(s):        --- Professional ---                           [id number], Colonoscopy, flexible; with biopsy, single                            or multiple Diagnosis Code(s):        --- Professional ---                           K64.8, Other hemorrhoids  R10.31, Right lower quadrant pain                           R19.7, Diarrhea, unspecified                           Q43.8, Other specified congenital malformations of                            intestine CPT copyright 2016 American Medical Association. All rights reserved. The codes documented in this report are preliminary and upon coder review may  be revised to meet current compliance requirements. Barney Drain, MD Barney Drain, MD 11/14/2016 8:42:29 AM This report has been signed electronically. Number of Addenda: 0

## 2016-11-14 NOTE — Op Note (Signed)
[facility] Patient Name: Jodi Ray Procedure Date: 11/14/2016 8:12 AM MRN: [id number] Date of Birth: 1960-08-09 Attending MD: Barney Drain , MD CSN: [id number] Age: 56 Admit Type: Outpatient Procedure:                Upper GI endoscopy WITH COLD FORCEPS BIOPSY Indications:              Abdominal pain in the right upper quadrant,                            Diarrhea, Nausea. ASA DAILY. Providers:                Barney Drain, MD, Gwenlyn Fudge RN, RN, Rosina Lowenstein, RN Referring MD:             Fuller Canada Manuella Ghazi MD, MD Medicines:                Propofol per Anesthesia Complications:            No immediate complications. Estimated Blood Loss:     Estimated blood loss was minimal. Procedure:                Pre-Anesthesia Assessment:                           - Prior to the procedure, a History and Physical                            was performed, and patient medications and                            allergies were reviewed. The patient's tolerance of                            previous anesthesia was also reviewed. The risks                            and benefits of the procedure and the sedation                            options and risks were discussed with the patient.                            All questions were answered, and informed consent                            was obtained. Prior Anticoagulants: The patient has                            taken aspirin, last dose was 1 day prior to                            procedure. ASA Grade Assessment: II - A patient  with mild systemic disease. After reviewing the                            risks and benefits, the patient was deemed in                            satisfactory condition to undergo the procedure.                            After obtaining informed consent, the endoscope was                            passed under direct vision. Throughout the             procedure, the patient's blood pressure, pulse, and                            oxygen saturations were monitored continuously. The                            [id number] ([id number]) scope was introduced through the                            mouth, and advanced to the second part of duodenum.                            The upper GI endoscopy was accomplished without                            difficulty. The patient tolerated the procedure                            well. Scope In: 8:13:41 AM Scope Out: 8:21:50 AM Total Procedure Duration: 0 hours 8 minutes 9 seconds  Findings:      The examined esophagus was normal.      Diffuse moderate inflammation characterized by congestion (edema),       erosions, erythema and granularity was found in the entire examined       stomach. Biopsies were taken with a cold forceps for Helicobacter pylori       testing.      A few small sessile polyps with no stigmata of recent bleeding were       found in the gastric body. The polyp was removed with a cold biopsy       forceps. Resection and retrieval were complete.      The examined duodenum was normal. Impression:               - NO SOURCE FOR DIARRHEA IDENTIFIED. NAUSEA/RUQ                            ABDOMINAL PAIN MOST LIKELY DUE TO ASA OR H PYLORI                           - MODERATE Gastritis.                           -  A few gastric polyps. Moderate Sedation:      Per Anesthesia Care Recommendation:           - High fiber diet and lactose free diet.                           - Continue present medications.                           - Await pathology results.                           - Return to my office in 4 months.                           - Patient has a contact number available for                            emergencies. The signs and symptoms of potential                            delayed complications were discussed with the                            patient. Return to normal  activities tomorrow.                            Written discharge instructions were provided to the                            patient. Procedure Code(s):        --- Professional ---                           [phone], Esophagogastroduodenoscopy, flexible,                            transoral; with biopsy, single or multiple Diagnosis Code(s):        --- Professional ---                           K29.70, Gastritis, unspecified, without bleeding                           K31.7, Polyp of stomach and duodenum                           R10.11, Right upper quadrant pain                           R19.7, Diarrhea, unspecified                           R11.0, Nausea CPT copyright 2016 American Medical Association. All rights reserved. The codes documented in this report are preliminary and upon coder review may  be revised to meet current compliance requirements. Barney Drain, MD Barney Drain, MD 11/14/2016 8:48:14 AM  This report has been signed electronically. Number of Addenda: 0

## 2016-11-14 NOTE — Transfer of Care (Signed)
Immediate Anesthesia Transfer of Care Note  Patient: Jodi Ray  Procedure(s) Performed: Procedure(s) with comments: COLONOSCOPY WITH PROPOFOL (N/A) - 730 ESOPHAGOGASTRODUODENOSCOPY (EGD) WITH PROPOFOL (N/A) BIOPSY - colon gastric duodenal  Patient Location: [place]  Anesthesia Type:MAC  Level of Consciousness: awake, alert , oriented and patient cooperative  Airway & Oxygen Therapy: Patient Spontanous Breathing and Patient connected to nasal cannula oxygen  Post-op Assessment: Report given to RN and Post -op Vital signs reviewed and stable  Post vital signs: Reviewed and stable  Last Vitals:  Vitals:   11/14/16 0715 11/14/16 0730  BP:    Resp: (!) 31 (!) 34  Temp:      Last Pain:  Vitals:   11/14/16 0740  TempSrc:   PainSc: 5       Patients Stated Pain Goal: 7 (26/71/24 5809)  Complications: No apparent anesthesia complications

## 2016-11-14 NOTE — Telephone Encounter (Signed)
Pt is at hospital now.

## 2016-11-16 ENCOUNTER — Telehealth: Payer: Self-pay

## 2016-11-16 DIAGNOSIS — I10 Essential (primary) hypertension: Secondary | ICD-10-CM | POA: Diagnosis not present

## 2016-11-16 DIAGNOSIS — E1142 Type 2 diabetes mellitus with diabetic polyneuropathy: Secondary | ICD-10-CM | POA: Diagnosis not present

## 2016-11-16 DIAGNOSIS — E039 Hypothyroidism, unspecified: Secondary | ICD-10-CM | POA: Diagnosis not present

## 2016-11-16 DIAGNOSIS — N183 Chronic kidney disease, stage 3 (moderate): Secondary | ICD-10-CM | POA: Diagnosis not present

## 2016-11-16 DIAGNOSIS — E1165 Type 2 diabetes mellitus with hyperglycemia: Secondary | ICD-10-CM | POA: Diagnosis not present

## 2016-11-16 DIAGNOSIS — Z299 Encounter for prophylactic measures, unspecified: Secondary | ICD-10-CM | POA: Diagnosis not present

## 2016-11-16 DIAGNOSIS — Z713 Dietary counseling and surveillance: Secondary | ICD-10-CM | POA: Diagnosis not present

## 2016-11-16 DIAGNOSIS — E875 Hyperkalemia: Secondary | ICD-10-CM | POA: Diagnosis not present

## 2016-11-16 DIAGNOSIS — E1122 Type 2 diabetes mellitus with diabetic chronic kidney disease: Secondary | ICD-10-CM | POA: Diagnosis not present

## 2016-11-16 DIAGNOSIS — Z6841 Body Mass Index (BMI) 40.0 and over, adult: Secondary | ICD-10-CM | POA: Diagnosis not present

## 2016-11-16 NOTE — Telephone Encounter (Signed)
Pt called and said she saw Dr. Manuella Ghazi this morning and he told her she CANNOT be on the plant based diet, because she is in stage 3 renal failure.

## 2016-11-16 NOTE — Progress Notes (Signed)
LMOM to call.

## 2016-11-16 NOTE — Progress Notes (Signed)
Pt is aware of results and plan. She has appt with PCP today. She has ordered the book on the plant based diet.

## 2016-11-17 DIAGNOSIS — E669 Obesity, unspecified: Secondary | ICD-10-CM | POA: Diagnosis not present

## 2016-11-17 DIAGNOSIS — E039 Hypothyroidism, unspecified: Secondary | ICD-10-CM | POA: Diagnosis not present

## 2016-11-17 DIAGNOSIS — I10 Essential (primary) hypertension: Secondary | ICD-10-CM | POA: Diagnosis not present

## 2016-11-17 DIAGNOSIS — E1140 Type 2 diabetes mellitus with diabetic neuropathy, unspecified: Secondary | ICD-10-CM | POA: Diagnosis not present

## 2016-11-17 DIAGNOSIS — E109 Type 1 diabetes mellitus without complications: Secondary | ICD-10-CM | POA: Diagnosis not present

## 2016-11-20 ENCOUNTER — Encounter (HOSPITAL_COMMUNITY): Payer: Self-pay | Admitting: Gastroenterology

## 2016-11-20 NOTE — Telephone Encounter (Signed)
PLEASE CALL PT. I spoke to THE KIDNEY SPECIALIST AND THERE IS NO REASON WHY SHE CANNOT BE ON A PLANT BASED DIET FOR WEIGHT LOSS. SHE SHOULD NOT FREQUENTLY EAT FOODS WITH HIGH POTASSIUM CONTENT LIKE CITRUS FRUITS.

## 2016-11-21 ENCOUNTER — Encounter (HOSPITAL_COMMUNITY)
Admission: RE | Admit: 2016-11-21 | Discharge: 2016-11-21 | Disposition: A | Discharge status: Home / Self Care | Payer: Medicare Other | Source: Ambulatory Visit | Attending: Cardiovascular Disease | Admitting: Cardiovascular Disease

## 2016-11-21 ENCOUNTER — Ambulatory Visit (HOSPITAL_COMMUNITY)
Admission: RE | Admit: 2016-11-21 | Discharge: 2016-11-21 | Disposition: A | Discharge status: Home / Self Care | Payer: Medicare Other | Source: Ambulatory Visit | Attending: Cardiovascular Disease | Admitting: Cardiovascular Disease

## 2016-11-21 DIAGNOSIS — I447 Left bundle-branch block, unspecified: Secondary | ICD-10-CM | POA: Diagnosis not present

## 2016-11-21 LAB — NM MYOCAR MULTI W/SPECT W/WALL MOTION / EF
CHL CUP NUCLEAR SSS: 16
LHR: 0.43
LVDIAVOL: 72 mL (ref 46–106)
LVSYSVOL: 19 mL
NUC STRESS TID: 1.03
Peak HR: 107 {beats}/min
Rest HR: 83 {beats}/min
SDS: 4
SRS: 12

## 2016-11-21 MED ORDER — REGADENOSON 0.4 MG/5ML IV SOLN
INTRAVENOUS | Status: AC
  Administered 2016-11-21: 0.4 mg via INTRAVENOUS
  Filled 2016-11-21: qty 5

## 2016-11-21 MED ORDER — TECHNETIUM TC 99M TETROFOSMIN IV KIT
30.0000 | PACK | Freq: Once | INTRAVENOUS | Status: AC | PRN
  Administered 2016-11-21: 30.6 via INTRAVENOUS

## 2016-11-21 MED ORDER — SODIUM CHLORIDE 0.9% FLUSH
INTRAVENOUS | Status: AC
  Administered 2016-11-21: 10 mL via INTRAVENOUS
  Filled 2016-11-21: qty 10

## 2016-11-21 MED ORDER — TECHNETIUM TC 99M TETROFOSMIN IV KIT
10.0000 | PACK | Freq: Once | INTRAVENOUS | Status: AC | PRN
  Administered 2016-11-21: 10.5 via INTRAVENOUS

## 2016-11-21 NOTE — Telephone Encounter (Signed)
PT is aware.

## 2016-11-22 ENCOUNTER — Telehealth: Payer: Self-pay | Admitting: Cardiovascular Disease

## 2016-11-22 NOTE — Telephone Encounter (Signed)
Patient states that she received results of myoview through [place] and would like to speak with nurse regarding repor.t / tg

## 2016-11-22 NOTE — Telephone Encounter (Signed)
Patient given result note from chart. Pt voiced understanding and states she will come for follow-up visit.

## 2016-11-23 ENCOUNTER — Telehealth: Payer: Self-pay | Admitting: *Deleted

## 2016-11-23 NOTE — Telephone Encounter (Signed)
Notes recorded by Laurine Blazer, LPN on 3/78/5885 at 0:27 PM EDT Left message to return call.  ------  Notes recorded by Herminio Commons, MD on 11/22/2016 at 12:34 PM EDT Some abnormalities which may be due to breast shadowing. I will discuss at fu visit. She was asymptomatic when I saw her. No changes to management for now.

## 2016-11-24 DIAGNOSIS — H524 Presbyopia: Secondary | ICD-10-CM | POA: Diagnosis not present

## 2016-11-24 DIAGNOSIS — E113593 Type 2 diabetes mellitus with proliferative diabetic retinopathy without macular edema, bilateral: Secondary | ICD-10-CM | POA: Diagnosis not present

## 2016-11-24 NOTE — Telephone Encounter (Signed)
Notes recorded by Laurine Blazer, LPN on 0/06/3934 at 9:40 AM EDT Patient notified and verbalized understanding. Copy to pmd. Stated that she is not having any symptoms. Follow up scheduled for 12/26/2016 with Dr. Bronson Ing. ------

## 2016-11-29 DIAGNOSIS — E039 Hypothyroidism, unspecified: Secondary | ICD-10-CM | POA: Diagnosis not present

## 2016-11-29 DIAGNOSIS — E1165 Type 2 diabetes mellitus with hyperglycemia: Secondary | ICD-10-CM | POA: Diagnosis not present

## 2016-11-29 DIAGNOSIS — I10 Essential (primary) hypertension: Secondary | ICD-10-CM | POA: Diagnosis not present

## 2016-11-29 DIAGNOSIS — E1122 Type 2 diabetes mellitus with diabetic chronic kidney disease: Secondary | ICD-10-CM | POA: Diagnosis not present

## 2016-11-29 DIAGNOSIS — Z299 Encounter for prophylactic measures, unspecified: Secondary | ICD-10-CM | POA: Diagnosis not present

## 2016-11-29 DIAGNOSIS — E1142 Type 2 diabetes mellitus with diabetic polyneuropathy: Secondary | ICD-10-CM | POA: Diagnosis not present

## 2016-11-29 DIAGNOSIS — M6530 Trigger finger, unspecified finger: Secondary | ICD-10-CM | POA: Diagnosis not present

## 2016-11-29 DIAGNOSIS — K589 Irritable bowel syndrome without diarrhea: Secondary | ICD-10-CM | POA: Diagnosis not present

## 2016-11-29 DIAGNOSIS — Z6841 Body Mass Index (BMI) 40.0 and over, adult: Secondary | ICD-10-CM | POA: Diagnosis not present

## 2016-11-29 DIAGNOSIS — N183 Chronic kidney disease, stage 3 (moderate): Secondary | ICD-10-CM | POA: Diagnosis not present

## 2016-11-29 DIAGNOSIS — R109 Unspecified abdominal pain: Secondary | ICD-10-CM | POA: Diagnosis not present

## 2016-12-01 DIAGNOSIS — E119 Type 2 diabetes mellitus without complications: Secondary | ICD-10-CM | POA: Diagnosis not present

## 2016-12-01 DIAGNOSIS — M159 Polyosteoarthritis, unspecified: Secondary | ICD-10-CM | POA: Diagnosis not present

## 2016-12-01 DIAGNOSIS — I10 Essential (primary) hypertension: Secondary | ICD-10-CM | POA: Diagnosis not present

## 2016-12-11 DIAGNOSIS — R110 Nausea: Secondary | ICD-10-CM | POA: Diagnosis not present

## 2016-12-11 DIAGNOSIS — R109 Unspecified abdominal pain: Secondary | ICD-10-CM | POA: Diagnosis not present

## 2016-12-11 DIAGNOSIS — E119 Type 2 diabetes mellitus without complications: Secondary | ICD-10-CM | POA: Diagnosis not present

## 2016-12-26 ENCOUNTER — Encounter: Payer: Self-pay | Admitting: Cardiovascular Disease

## 2016-12-26 ENCOUNTER — Ambulatory Visit (INDEPENDENT_AMBULATORY_CARE_PROVIDER_SITE_OTHER): Payer: Medicare Other | Admitting: Cardiovascular Disease

## 2016-12-26 VITALS — BP 136/81 | HR 78 | Ht 69.0 in | Wt 260.0 lb

## 2016-12-26 DIAGNOSIS — E1165 Type 2 diabetes mellitus with hyperglycemia: Secondary | ICD-10-CM | POA: Diagnosis not present

## 2016-12-26 DIAGNOSIS — Z299 Encounter for prophylactic measures, unspecified: Secondary | ICD-10-CM | POA: Diagnosis not present

## 2016-12-26 DIAGNOSIS — K219 Gastro-esophageal reflux disease without esophagitis: Secondary | ICD-10-CM | POA: Diagnosis not present

## 2016-12-26 DIAGNOSIS — E108 Type 1 diabetes mellitus with unspecified complications: Secondary | ICD-10-CM | POA: Diagnosis not present

## 2016-12-26 DIAGNOSIS — E1142 Type 2 diabetes mellitus with diabetic polyneuropathy: Secondary | ICD-10-CM | POA: Diagnosis not present

## 2016-12-26 DIAGNOSIS — R109 Unspecified abdominal pain: Secondary | ICD-10-CM | POA: Diagnosis not present

## 2016-12-26 DIAGNOSIS — Z6839 Body mass index (BMI) 39.0-39.9, adult: Secondary | ICD-10-CM | POA: Diagnosis not present

## 2016-12-26 DIAGNOSIS — I447 Left bundle-branch block, unspecified: Secondary | ICD-10-CM

## 2016-12-26 DIAGNOSIS — N183 Chronic kidney disease, stage 3 unspecified: Secondary | ICD-10-CM

## 2016-12-26 DIAGNOSIS — I10 Essential (primary) hypertension: Secondary | ICD-10-CM | POA: Diagnosis not present

## 2016-12-26 DIAGNOSIS — R9439 Abnormal result of other cardiovascular function study: Secondary | ICD-10-CM | POA: Diagnosis not present

## 2016-12-26 DIAGNOSIS — E1122 Type 2 diabetes mellitus with diabetic chronic kidney disease: Secondary | ICD-10-CM | POA: Diagnosis not present

## 2016-12-26 DIAGNOSIS — K589 Irritable bowel syndrome without diarrhea: Secondary | ICD-10-CM | POA: Diagnosis not present

## 2016-12-26 DIAGNOSIS — E039 Hypothyroidism, unspecified: Secondary | ICD-10-CM | POA: Diagnosis not present

## 2016-12-26 DIAGNOSIS — G4733 Obstructive sleep apnea (adult) (pediatric): Secondary | ICD-10-CM | POA: Diagnosis not present

## 2016-12-26 MED ORDER — ATORVASTATIN CALCIUM 20 MG PO TABS: 20.0000 mg | ORAL_TABLET | Freq: Every day | ORAL | 11 refills | Status: DC

## 2016-12-26 NOTE — Progress Notes (Signed)
SUBJECTIVE: The patient returns for follow-up after undergoing cardiovascular testing performed for the evaluation of LBBB.  She is asymptomatic with respect to coronary artery disease and specifically denies chest pain. She only gets short of breath if she walks long distances.  Nuclear stress test on 11/21/16 demonstrated the following:  Left bundle branch block present throughout.  Moderate sized, moderate intensity, mid to apical anteroseptal defect that is partially reversible. Significant degree of breast attenuation affects the study and may contribute to this defect, however cannot exclude a moderate degree of ischemia.  This is an intermediate risk study.  Nuclear stress EF: 73%.  After her last office visit with me, she spoke with cousins who informed her that there is a very strong family of heart disease in her family. Several aunts and uncles died prematurely of heart disease.  She has decided to join [employer] in [place].  Social history: She is originally from [place]. She used to be a Forensic psychologist. She knows [place] very well.  Review of Systems: As per "subjective", otherwise negative.  Allergies  Allergen Reactions  . Fentanyl Other (See Comments)    Nausea, vomiting, loss of consciousness, requiring reversal    Current Outpatient Prescriptions  Medication Sig Dispense Refill  . aspirin EC 81 MG tablet Take 81 mg by mouth daily.    . B-D ULTRAFINE III SHORT PEN 31G X 8 MM MISC 4 (four) times daily.  0  . Cholecalciferol (VITAMIN D-3) 5000 units TABS Take 5,000 Units by mouth daily.     . Elastic Bandages & Supports (WRIST SPLINT/COCK-UP/LEFT L) MISC 1 each by Does not apply route as directed. 1 each 0  . Elastic Bandages & Supports (WRIST SPLINT/COCK-UP/RIGHT L) MISC 1 each by Does not apply route as directed. 1 each 0  . Fluocinolone Acetonide (DERMOTIC) 0.01 % OIL Place 1 drop into both ears at bedtime.     . gabapentin (NEURONTIN) 400 MG  capsule Take 400 mg by mouth daily.    Marland Kitchen gabapentin (NEURONTIN) 800 MG tablet Take 800 mg by mouth 2 (two) times daily.     . hydrochlorothiazide (MICROZIDE) 12.5 MG capsule Take 12.5 mg by mouth daily.    . Insulin Human (INSULIN PUMP) SOLN Inject into the skin continuous. Novolog    . levothyroxine (SYNTHROID, LEVOTHROID) 125 MCG tablet Take 125 mcg by mouth daily before breakfast.   0  . lisinopril (PRINIVIL,ZESTRIL) 10 MG tablet take 1 tablet by mouth once daily 30 tablet 2  . loratadine (CLARITIN) 10 MG tablet Take 10 mg by mouth daily.    . metFORMIN (GLUCOPHAGE) 500 MG tablet Take 500 mg by mouth 2 (two) times daily with a meal.    . Multiple Vitamins-Minerals (CENTRUM SILVER 50+WOMEN) TABS Take 1 tablet by mouth daily.     . Omega-3 Fatty Acids (FISH OIL) 1000 MG CAPS Take 1,000 mg by mouth daily.    Marland Kitchen omeprazole (PRILOSEC) 40 MG capsule Take 40 mg by mouth 2 (two) times daily.     Marland Kitchen topiramate (TOPAMAX) 50 MG tablet Take 1 tablet (50 mg total) by mouth daily. (Patient taking differently: Take 50 mg by mouth at bedtime. ) 90 tablet 3  . triazolam (HALCION) 0.25 MG tablet Take 0.25 mg by mouth at bedtime.      No current facility-administered medications for this visit.     Past Medical History:  Diagnosis Date  . 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 Anemia   .  Brain aneurysm    frontal lobe  . Diabetes mellitus without complication ([place])   . Headache   . HTN (hypertension)   . Hypothyroidism   . Neuromuscular disorder ([place])   . Neuropathy   . Obesity   . OSA on CPAP 03/10/2015  . Retinopathy   . Sleep apnea   . Varicose veins of both lower extremities     Past Surgical History:  Procedure Laterality Date  . BIOPSY  11/14/2016   Procedure: BIOPSY;  Surgeon: Danie Binder, MD;  Location: [facility];  Service: Endoscopy;;  colon gastric duodenal  . BREAST SURGERY Left 1994   Lumpectomy  . COLONOSCOPY WITH PROPOFOL N/A 11/14/2016   Procedure: COLONOSCOPY WITH PROPOFOL;  Surgeon:  Danie Binder, MD;  Location: [facility];  Service: Endoscopy;  Laterality: N/A;  730  . ESOPHAGOGASTRODUODENOSCOPY (EGD) WITH PROPOFOL N/A 11/14/2016   Procedure: ESOPHAGOGASTRODUODENOSCOPY (EGD) WITH PROPOFOL;  Surgeon: Danie Binder, MD;  Location: [facility];  Service: Endoscopy;  Laterality: N/A;  . FOOT SURGERY    . LEG SURGERY Right   . REPLACEMENT TOTAL KNEE Right   . SPINAL CORD STIMULATOR IMPLANT      Social History   Social History  . Marital status: Widowed    Spouse name: N/A  . Number of children: N/A  . Years of education: N/A   Occupational History  . Not on file.   Social History Main Topics  . Smoking status: Never Smoker  . Smokeless tobacco: Never Used  . Alcohol use No  . Drug use: No  . Sexual activity: Yes    Partners: Male   Other Topics Concern  . Not on file   Social History Narrative   Drinks about 1-2 cups of caffeine daily.     Vitals:   12/26/16 1514  BP: 136/81  Pulse: 78  SpO2: 97%  Weight: 260 lb (117.9 kg)  Height: 5\' 9"  (1.753 m)    Wt Readings from Last 3 Encounters:  12/26/16 260 lb (117.9 kg)  11/10/16 256 lb (116.1 kg)  11/09/16 256 lb (116.1 kg)     PHYSICAL EXAM General: NAD HEENT: Normal. Neck: No JVD, no thyromegaly. Lungs: Clear to auscultation bilaterally with normal respiratory effort. CV: Nondisplaced PMI.  Regular rate and rhythm, normal S1/S2, no S3/S4, no murmur. No pretibial or periankle edema.     Abdomen: Soft, nontender, no distention.  Neurologic: Alert and oriented.  Psych: Normal affect. Skin: Normal. Musculoskeletal: No gross deformities.    ECG: Most recent ECG reviewed.   Labs: Lab Results  Component Value Date/Time   K 5.3 (H) 11/09/2016 10:35 AM   BUN 41 (H) 11/09/2016 10:35 AM   CREATININE 1.45 (H) 11/09/2016 10:35 AM   CREATININE 0.98 07/23/2015 07:07 AM   TSH 1.990 07/23/2015 07:07 AM   HGB 12.5 11/09/2016 10:35 AM     Lipids: No results found for: LDLCALC,  LDLDIRECT, CHOL, TRIG, HDL     ASSESSMENT AND PLAN: 1. Left bundle-branch block with abnormal stress test: Nuclear stress test results detailed above with significant soft tissue attenuation artifact as well as partial reversibility. Ischemia could not entirely be excluded. She is asymptomatic with respect to coronary artery disease. Risk factors include long-standing type 1 diabetes and hypertension. I will elect to monitor her for symptoms. She is on aspirin 81 mg. I will start Lipitor 20 mg as she would benefit from this given her concomitant history of diabetes. I encouraged her in  her attempts to increase exercise.  2. Hypertension: Controlled. No changes to therapy.  3. Type 1 diabetes: On long-term insulin.    Disposition: Follow up 1 yr.  Kate Sable, M.D., F.A.C.C.

## 2016-12-26 NOTE — Patient Instructions (Signed)
Medication Instructions:   Begin Lipitor 20mg  daily.  Continue all other medications.    Labwork: none  Testing/Procedures: none  Follow-Up: Your physician wants you to follow up in:  1 year.  You will receive a reminder letter in the mail one-two months in advance.  If you don't receive a letter, please call our office to schedule the follow up appointment   Any Other Special Instructions Will Be Listed Below (If Applicable).  If you need a refill on your cardiac medications before your next appointment, please call your pharmacy.

## 2016-12-27 ENCOUNTER — Other Ambulatory Visit: Payer: Self-pay | Admitting: Neurology

## 2017-01-09 ENCOUNTER — Ambulatory Visit (INDEPENDENT_AMBULATORY_CARE_PROVIDER_SITE_OTHER): Payer: Medicare Other | Admitting: Neurology

## 2017-01-09 ENCOUNTER — Encounter: Payer: Self-pay | Admitting: Neurology

## 2017-01-09 VITALS — BP 130/70 | HR 81 | Ht 69.0 in | Wt 263.0 lb

## 2017-01-09 DIAGNOSIS — G4485 Primary stabbing headache: Secondary | ICD-10-CM

## 2017-01-09 DIAGNOSIS — E1042 Type 1 diabetes mellitus with diabetic polyneuropathy: Secondary | ICD-10-CM

## 2017-01-09 NOTE — Progress Notes (Signed)
NEUROLOGY FOLLOW UP OFFICE NOTE  Jodi Ray [id number]  HISTORY OF PRESENT ILLNESS: Jodi Ray is a 56 year old right-handed woman with chronic pain related to complex regional pain syndrome (with spinal stimulator), hypertension, type I diabetes mellitus with neuropathy, OSA on CPAP, hypothyroidism, polymyalgia rheumatica, and morbid obesity who follows up for primary stabbing headache.   UPDATE: Current preventative therapy:  Topiramate 50mg  Headaches are well controlled.  They are dull 4/10 pain, lasting 30 minutes and occuring about 3 times a month.  Change in weather seems to be a trigger.    Neuropathy is severe.  Wrist splints help with the carpal tunnel syndrome symptoms.  She is starting with a personal trainer to help work out in order to lose weight and get more energy.     HISTORY: Primary Stabbing Headache: Onset:  2011.  She was previously treated in [place], where she was diagnosed with primary stabbing headache. Location:  Right frontal region Quality:  stabbing Intensity:  10/10 Aura:  no Prodrome:  no Associated symptoms:  Some nausea if severe. No autonomic symptoms. Duration:  10 to 60 minutes Frequency:  4 times a week Triggers/exacerbating factors:  change in weather. Relieving factors:  none Activity:  Cannot function when experiencing it.   Past preventative therapy:  Indomethacin 25mg  three times daily (effective but discontinued due to elevated liver enzymes), Verapamil (ineffective)    Migraines:  They are bi-frontal/maxillary and associated with slight nausea.  She has had this before when her allergies "act up."    Diabetic neuropathy:  Secondary to Type 1 diabetes.   Complex regional pain syndrome:  following an accident where she fell down the steps and crushed her right leg.  She takes gabapentin, tramadol and has a spinal nerve stimulator.  She takes this for her painful diabetic neuropathy as well.  Right arm pain and  numbness:  Since 2015, she has had episodes of right arm pain and numbness.  It only occurs when she is laying in bed, and occurs no matter what position.  Her entire arm goes "dead".  It is both numb, painful and unable to move it.  There is no shooting pain down the arm from the neck, however she gets occasional shooting pain from the right side of her neck into the shoulder.  She has to use her other arm to shake it out and it resolves in a minute or two.  It occurs every night.  She was sent to pain management for possible cervical radiculopathy or thoracic outlet syndrome.  She did receive injections, which helped.  She had an MRI of the cervical and thoracic spine on 06/12/14.  Imaging not available, but report mentions diffuse facet arthropathy and degenerative changes in the cervical spine, but no nerve root impingement or cord compression.  There was no evidence of thoracic outlet syndrome.  She was sent to [facility] for further evaluation.  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  They told her that her symptoms were related to her diabetic neuropathy.  Cerebral aneurysm:  In 2009, she had a CTA of the head which reportedly showed a 1-55mm aneurysm at the junction of right A1 and A2 segment of the ACA.  However, a repeat CTA performed on 02/22/12 did not reveal any aneurysm.    PAST MEDICAL HISTORY: Past Medical History:  Diagnosis Date  . Adult  RDS ([place])   . Anemia   . Brain aneurysm    frontal lobe  . Diabetes mellitus without complication ([place])   . Headache   . HTN (hypertension)   . Hypothyroidism   . Neuromuscular disorder ([place])   . Neuropathy   . Obesity   . OSA on CPAP 03/10/2015  . Retinopathy   . Sleep apnea   . Varicose veins of both lower extremities     MEDICATIONS: Current Outpatient Prescriptions on File Prior to Visit  Medication  Sig Dispense Refill  . aspirin EC 81 MG tablet Take 81 mg by mouth daily.    Marland Kitchen atorvastatin (LIPITOR) 20 MG tablet Take 1 tablet (20 mg total) by mouth daily. 30 tablet 11  . B-D ULTRAFINE III SHORT PEN 31G X 8 MM MISC 4 (four) times daily.  0  . Cholecalciferol (VITAMIN D-3) 5000 units TABS Take 5,000 Units by mouth daily.     . Elastic Bandages & Supports (WRIST SPLINT/COCK-UP/LEFT L) MISC 1 each by Does not apply route as directed. 1 each 0  . Elastic Bandages & Supports (WRIST SPLINT/COCK-UP/RIGHT L) MISC 1 each by Does not apply route as directed. 1 each 0  . Fluocinolone Acetonide (DERMOTIC) 0.01 % OIL Place 1 drop into both ears at bedtime.     . gabapentin (NEURONTIN) 400 MG capsule Take 400 mg by mouth daily.    Marland Kitchen gabapentin (NEURONTIN) 800 MG tablet Take 800 mg by mouth 2 (two) times daily.     . hydrochlorothiazide (MICROZIDE) 12.5 MG capsule Take 12.5 mg by mouth daily.    . Insulin Human (INSULIN PUMP) SOLN Inject into the skin continuous. Novolog    . levothyroxine (SYNTHROID, LEVOTHROID) 125 MCG tablet Take 125 mcg by mouth daily before breakfast.   0  . lisinopril (PRINIVIL,ZESTRIL) 10 MG tablet take 1 tablet by mouth once daily 30 tablet 2  . loratadine (CLARITIN) 10 MG tablet Take 10 mg by mouth daily.    . metFORMIN (GLUCOPHAGE) 500 MG tablet Take 500 mg by mouth 2 (two) times daily with a meal.    . Multiple Vitamins-Minerals (CENTRUM SILVER 50+WOMEN) TABS Take 1 tablet by mouth daily.     . Omega-3 Fatty Acids (FISH OIL) 1000 MG CAPS Take 1,000 mg by mouth daily.    Marland Kitchen omeprazole (PRILOSEC) 40 MG capsule Take 40 mg by mouth 2 (two) times daily.     Marland Kitchen topiramate (TOPAMAX) 50 MG tablet take 1 tablet by mouth once daily 90 tablet 0  . triazolam (HALCION) 0.25 MG tablet Take 0.25 mg by mouth at bedtime.      No current facility-administered medications on file prior to visit.     ALLERGIES: Allergies  Allergen Reactions  . Fentanyl Other (See Comments)    Nausea,  vomiting, loss of consciousness, requiring reversal    FAMILY HISTORY: Family History  Problem Relation Age of Onset  . Cancer Mother 26       colon  . Cancer Father        renal cell  . Cancer Brother        bladder  . Cancer Sister        primary brain, and primary breast too  . Cancer Sister        breast   . Cancer Other        multiple aunts with breast, cervical, or colon cancer    SOCIAL HISTORY: Social History   Social History  . Marital status:  Widowed    Spouse name: N/A  . Number of children: N/A  . Years of education: N/A   Occupational History  . Not on file.   Social History Main Topics  . Smoking status: Never Smoker  . Smokeless tobacco: Never Used  . Alcohol use No  . Drug use: No  . Sexual activity: Yes    Partners: Male   Other Topics Concern  . Not on file   Social History Narrative   Drinks about 1-2 cups of caffeine daily.    REVIEW OF SYSTEMS: Constitutional: No fevers, chills, or sweats, no generalized fatigue, change in appetite Eyes: No visual changes, double vision, eye pain Ear, nose and throat: No hearing loss, ear pain, nasal congestion, sore throat Cardiovascular: No chest pain, palpitations Respiratory:  No shortness of breath at rest or with exertion, wheezes GastrointestinaI: No nausea, vomiting, diarrhea, abdominal pain, fecal incontinence Genitourinary:  No dysuria, urinary retention or frequency Musculoskeletal:  No neck pain, back pain Integumentary: No rash, pruritus, skin lesions Neurological: as above Psychiatric: No depression, insomnia, anxiety Endocrine: No palpitations, fatigue, diaphoresis, mood swings, change in appetite, change in weight, increased thirst Hematologic/Lymphatic:  No purpura, petechiae. Allergic/Immunologic: no itchy/runny eyes, nasal congestion, recent allergic reactions, rashes  PHYSICAL EXAM: Vitals:   01/09/17 0922  BP: 130/70  Pulse: 81   General: No acute distress.  Patient  appears well-groomed. . Head:  Normocephalic/atraumatic Eyes:  Fundi examined but not visualized Neck: supple, no paraspinal tenderness, full range of motion Heart:  Regular rate and rhythm Lungs:  Clear to auscultation bilaterally Back: No paraspinal tenderness Neurological Exam: alert and oriented to person, place, and time. Attention span and concentration intact, recent and remote memory intact, fund of knowledge intact. Speech fluent and not dysarthric, language intact. CN II-XII intact. Bulk and tone normal, decreased effort testing muscle strength in all extremities due to pain.. Decreased temperature and vibration sensation in lower extremities. Deep tendon reflexes 1+ throughout except absent in ankles. Finger to nose testing intact. Gait with right limp.  Romberg with sway  IMPRESSION: Primary stabbing headache stable Diabetic neuropathy  PLAN: Topiramate 50mg  at bedtime On gabapentin as per PCP Glycemic control (with insulin pump) Exercise and weight loss Follow up in one year  Metta Clines, DO  CC:  Monico Blitz, MD

## 2017-01-09 NOTE — Patient Instructions (Signed)
1.  Continue topiramate 50mg  at bedtime 2.  Continue with your diet and working with the trainer to lose weight and feel better 3.  Follow up in one year

## 2017-01-21 ENCOUNTER — Encounter: Payer: Self-pay | Admitting: Neurology

## 2017-01-22 ENCOUNTER — Ambulatory Visit (INDEPENDENT_AMBULATORY_CARE_PROVIDER_SITE_OTHER): Payer: Medicare Other | Admitting: Neurology

## 2017-01-22 ENCOUNTER — Encounter: Payer: Self-pay | Admitting: Neurology

## 2017-01-22 VITALS — BP 133/66 | HR 100 | Ht 69.0 in | Wt 259.0 lb

## 2017-01-22 DIAGNOSIS — G4733 Obstructive sleep apnea (adult) (pediatric): Secondary | ICD-10-CM

## 2017-01-22 DIAGNOSIS — Z9989 Dependence on other enabling machines and devices: Secondary | ICD-10-CM | POA: Diagnosis not present

## 2017-01-22 NOTE — Progress Notes (Signed)
SLEEP MEDICINE CLINIC   Provider:  Larey Seat, M D  Referring Provider: Monico Blitz, MD Primary Care Physician:  Monico Blitz, MD  Chief Complaint  Patient presents with  . OSA on CPAP    Reports wearing her CPAP 8-9 hours each night.  She is back early due to an increase in her snoring.    HPI:  Jodi Ray is a 56 y.o. female , seen here as a referral from Dr. Manuella Ghazi for transfer of sleep care,  Chief complaint according to patient : " I need new supplies, i don't need a sleep study".   Interval history from 01/22/2017. This is my first revisit with the patient since transfer of sleep care. The patient is doing very well with CPAP use, being 100% compliant with an average user time of 8 hours and 12 minutes, set pressure of 10.6 cm water with 27 m EPR and a residual AHI of 2.1. Of these obstructive apneas are 1.4 per hour. The patient's boyfriend has noted her to still snore and for this reason I will asked today to increase her pressure to 11.6 cm water. I will contact [facility]'s Linzie Collin to do the arrangements, making him aware that we treat snoring. Epworth Sleepiness Scale was endorsed at 4 points and the patient did not endorse fatigue  CONSULT NOTE: Jodi Ray, a right handed caucasian female patient of Dr. Trena Platt, is today referred for follow-up of her sleep apnea condition. She was diagnosed more than 13 years ago with obstructive sleep apnea and had been followed by Dr. Adriana Reams but he has left the [facility] , she has received a new CPAP machine just in December 2015 so she is not in need of any new equipment but she needs continued supplies. As she has been a Medicare patient since 2006 she will need a sleep doctor visit once a year to allow her to obtain CPAP supplies. The patient moved here from [place] in 2015 and her original sleep study was performed in [place], she was told they are no records available as they were older than 10 years. Her  risk factors are clear as she has hypertension associated with diabetes, morbid obesity, hypothyroidism and diabetes. She was also diagnosed with a polyneuropathy, she has been diagnosed with an intracerebral aneurysm and was a stabbing headache or ice pick headache. She is a nonsmoker and nondrinker and she drinks only one cup of caffeine at coffee per day. Her blood pressure has been well controlled on her current medical regimen,  is almost too low.  Jodi Ray is followed by a pain management practice. She has a new general  neurologist, Metta Clines, DO , who follows her headaches.   I'm able to review Jodi Ray's last CPAP download, obtained on 03-09-15, just yesterday in preparation for this visit. It shows that she is 100% compliant for 30 out of 30 days and all these days she has used her CPAP more than 4 hours, 7 hours and 52 minutes on average. Her pressure is 10.6 cm water with a full-time EPR of 2 cm water. A residual AHI is 1.4. She does have high air leaks. It seems that she is still using an AutoSet a so-called AUTO set S 10 machine by [employer].  She is feeling comfortable with the current setting is sleeping well and does not wake up with headaches, dry mouth or from snoring. She will have one bathroom break at night. She usually sleeps  from 10 PM plus minus one hour, to 6 AM when her dog wakes her up. She feels refreshed and restored in the mornings and feels that overall the morning time as her best time of the day to be productive and energized. She does not nap in daytime.  Interval history from 03/09/2016,This is a compliance visit. She does not drink does not use tobacco products or recreational drugs, she endorsed apnea ice pick headaches and speech difficulties. Her compliance download shows that she has used the machine 100% of all days over 4 hours with an average user time of 8 hours and 19 minutes,  pressure is 10.6 cm water, with  full-time EPR of 2 cm water residual AHI is  excellent at only 1.3. There has been no major air leak. The needs to be no adjustments made to the machine. She also responds with an Epworth sleepiness score of only 3 points fatigue severity scale was not endorsed.  She is taking a sleep aid for almost 10 years, continued under Dr Dois Davenport care.     Sleep medical history and family sleep history: She is the only apnea patient in her family, no history of sleep walking. Social history: non smoker, non s drinker, 1 cup of coffee daily , disabled. [place]oved [place] 2[place] ago ,   Review of Systems: Out of a complete 14 system review, the patient complains of only the following symptoms, and all other reviewed systems are negative. The patient endorsed the Epworth Sleepiness Scale at 7 points and the fatigue severity scale at 13 points- well below average. Nasal allergic rhinitis, diabetes type 1 but better controlled. Weight gain, chronic back pain she has a spinal cord stimulator 2005 she has "Charcot "-feet and had a bone surgery to the left foot 2010. She had a fracture repaired with hardware in the right leg 2003.   Social History   Social History  . Marital status: Widowed    Spouse name: N/A  . Number of children: N/A  . Years of education: N/A   Occupational History  . Not on file.   Social History Main Topics  . Smoking status: Never Smoker  . Smokeless tobacco: Never Used  . Alcohol use No  . Drug use: No  . Sexual activity: Yes    Partners: Male   Other Topics Concern  . Not on file   Social History Narrative   Drinks about 1-2 cups of caffeine daily.    Family History  Problem Relation Age of Onset  . Cancer Mother 73       colon  . Cancer Father        renal cell  . Cancer Brother        bladder  . Cancer Sister        primary brain, and primary breast too  . Cancer Sister        breast   . Cancer Other        multiple aunts with breast, cervical, or colon cancer    Past Medical History:  Diagnosis  Date  . Adult RDS ([place])   . Anemia   . Brain aneurysm    frontal lobe  . Diabetes mellitus without complication ([place])   . Headache   . HTN (hypertension)   . Hypothyroidism   . Neuromuscular disorder ([place])   . Neuropathy   . Obesity   . OSA on CPAP 03/10/2015  . Retinopathy   . Sleep apnea   .  Varicose veins of both lower extremities     Past Surgical History:  Procedure Laterality Date  . BIOPSY  11/14/2016   Procedure: BIOPSY;  Surgeon: Danie Binder, MD;  Location: [facility];  Service: Endoscopy;;  colon gastric duodenal  . BREAST SURGERY Left 1994   Lumpectomy  . COLONOSCOPY WITH PROPOFOL N/A 11/14/2016   Procedure: COLONOSCOPY WITH PROPOFOL;  Surgeon: Danie Binder, MD;  Location: [facility];  Service: Endoscopy;  Laterality: N/A;  730  . ESOPHAGOGASTRODUODENOSCOPY (EGD) WITH PROPOFOL N/A 11/14/2016   Procedure: ESOPHAGOGASTRODUODENOSCOPY (EGD) WITH PROPOFOL;  Surgeon: Danie Binder, MD;  Location: [facility];  Service: Endoscopy;  Laterality: N/A;  . FOOT SURGERY    . LEG SURGERY Right   . REPLACEMENT TOTAL KNEE Right   . SPINAL CORD STIMULATOR IMPLANT      Current Outpatient Prescriptions  Medication Sig Dispense Refill  . aspirin EC 81 MG tablet Take 81 mg by mouth daily.    Marland Kitchen atorvastatin (LIPITOR) 20 MG tablet Take 1 tablet (20 mg total) by mouth daily. 30 tablet 11  . B-D ULTRAFINE III SHORT PEN 31G X 8 MM MISC 4 (four) times daily.  0  . Cholecalciferol (VITAMIN D-3) 5000 units TABS Take 5,000 Units by mouth daily.     . Elastic Bandages & Supports (WRIST SPLINT/COCK-UP/LEFT L) MISC 1 each by Does not apply route as directed. 1 each 0  . Elastic Bandages & Supports (WRIST SPLINT/COCK-UP/RIGHT L) MISC 1 each by Does not apply route as directed. 1 each 0  . Fluocinolone Acetonide (DERMOTIC) 0.01 % OIL Place 1 drop into both ears at bedtime.     . gabapentin (NEURONTIN) 400 MG capsule Take 400 mg by mouth daily.    Marland Kitchen gabapentin (NEURONTIN) 800 MG tablet  Take 800 mg by mouth daily.     . hydrochlorothiazide (MICROZIDE) 12.5 MG capsule Take 12.5 mg by mouth daily.    . Insulin Human (INSULIN PUMP) SOLN Inject into the skin continuous. Novolog    . levothyroxine (SYNTHROID, LEVOTHROID) 125 MCG tablet Take 125 mcg by mouth daily before breakfast.   0  . lisinopril (PRINIVIL,ZESTRIL) 10 MG tablet take 1 tablet by mouth once daily 30 tablet 2  . loratadine (CLARITIN) 10 MG tablet Take 10 mg by mouth daily.    . metFORMIN (GLUCOPHAGE) 500 MG tablet Take 500 mg by mouth 2 (two) times daily with a meal.    . Multiple Vitamins-Minerals (CENTRUM SILVER 50+WOMEN) TABS Take 1 tablet by mouth daily.     . Omega-3 Fatty Acids (FISH OIL) 1000 MG CAPS Take 1,000 mg by mouth daily.    Marland Kitchen omeprazole (PRILOSEC) 40 MG capsule Take 40 mg by mouth 2 (two) times daily.     Marland Kitchen topiramate (TOPAMAX) 50 MG tablet take 1 tablet by mouth once daily 90 tablet 0  . triazolam (HALCION) 0.25 MG tablet Take 0.25 mg by mouth at bedtime.      No current facility-administered medications for this visit.     Allergies as of 01/22/2017 - Review Complete 01/22/2017  Allergen Reaction Noted  . Fentanyl Other (See Comments) 11/09/2016    Vitals: BP 133/66   Pulse 100   Ht 5\' 9"  (1.753 m)   Wt 259 lb (117.5 kg)   BMI 38.25 kg/m  Last Weight:  Wt Readings from Last 1 Encounters:  01/22/17 259 lb (117.5 kg)   QPY:PPJK mass index is 38.25 kg/m.     Last Height:  Ht Readings from Last 1 Encounters:  01/22/17 5\' 9"  (1.753 m)    Physical exam:  General: The patient is awake, alert and appears not in acute distress. The patient is well groomed. Head: Normocephalic, atraumatic. Neck is supple. Mallampati 4 , poor dental status.  neck circumference:15. Nasal airflow unrestricted , TMJ is not evident . Retrognathia is seen.  Cardiovascular:  Regular rate and rhythm, without  murmurs or carotid bruit, and without distended neck veins. Respiratory: Lungs are clear to  auscultation. Skin:  Without evidence of edema, or rash Trunk: BMI is elevated . The patient's posture is hunched    Neurologic exam : The patient is awake and alert, oriented to place and time.   Memory subjective described as intact.  Memory testing revealed no defiicts. Attention span & concentration ability appears normal.  Speech is fluent,  without dysarthria, dysphonia or aphasia.  Cranial nerves:  Taste and smell intact . Pupils are equal and briskly reactive to light. Visual fields by finger perimetry are intact. Hearing to finger rub intact. Facial motor strength is symmetric and tongue and uvula move midline. Shoulder shrug was symmetrical.  Motor exam:  Normal tone, muscle bulk and symmetric strength in all extremities. Sensory:  Fine touch, pinprick and vibration were tested in all extremities. No tremor. Gait and station: Patient walks with a cane for  assistive device and is able unassisted to climb up to the exam table.  Strength within normal limits. Stance is stable and normal. Uses a cane .Tandem gait is deferred - she Turns with 4 Steps. Romberg testing is negative. Deep tendon reflexes: in the  upper and lower extremities are symmetric and intact. Babinski maneuver response is downgoing.  The patient was advised of the nature of the diagnosed sleep disorder , the treatment options and risks for general a health and wellness arising from not treating the condition.  I spent more than 25 minutes of face to face time with the patient. Greater than 50% of time was spent in counseling and coordination of care. We have discussed the diagnosis and differential and I answered the patient's questions.     Assessment:  After physical and neurologic examination, review of laboratory studies,  Personal review of imaging studies, reports of other /same  Imaging studies ,  Results of polysomnography/ neurophysiology testing and pre-existing records as far as provided in visit., my  assessment is:   1) patient with a diagnosis of obstructive sleep apnea also her original test results are not available. She will has carried this diagnosis for over 13 years she has been treated for CPAP on an auto titrating machine  and received a new machine just in December 2015, prescribed by Dr. Redmond Pulling.  Based  on her 2016 and now 2017 downloads, I was also able to see her 2018 download today and she is 100% compliant, uses a set pressure of 10.6 cm water with 2 cm EPR and has a remarkably low residual AHI of only 2.1 per hour. Unfortunately her boyfriend still notices her to snore and for this reason I will asked to increase the CPAP pressure by just 1 cm this may further eliminate the residual apnea but I hope that it'll treat her snoring. Her Epworth Sleepiness Scale was endorsed at only 4. and she is not excessively daytime fatigued nor depressed.  We will continue to prescribe the supplies she needs. Her main risk factor is obesity- and muscle relaxant therapy .  DME is [facility]-  Linzie Collin, RRT-PSG.   2) as before, chronic pain patient with spinal cord stimulator implant followed by local pain management clinic.  3) as before, stabbing headache-ice pick headache patient with a diagnosis of intracerebral aneurysm without bleed, followed by Metta Clines, DO. She is doing well .     Plan:  Treatment plan and additional workup :  Dear Dr. Manuella Ghazi, Dear Dr. Loretta Plume  Thank you for allowing me to participate in this patient's care. I will increase CPAP pressure just for the sake of eliminating her snoring.    Sincerely,  Larey Seat MD  01/22/2017   CC: Monico Blitz, [place], [place] [id number]

## 2017-01-26 DIAGNOSIS — E1142 Type 2 diabetes mellitus with diabetic polyneuropathy: Secondary | ICD-10-CM | POA: Diagnosis not present

## 2017-01-26 DIAGNOSIS — E1122 Type 2 diabetes mellitus with diabetic chronic kidney disease: Secondary | ICD-10-CM | POA: Diagnosis not present

## 2017-01-26 DIAGNOSIS — G4733 Obstructive sleep apnea (adult) (pediatric): Secondary | ICD-10-CM | POA: Diagnosis not present

## 2017-01-26 DIAGNOSIS — E1165 Type 2 diabetes mellitus with hyperglycemia: Secondary | ICD-10-CM | POA: Diagnosis not present

## 2017-01-26 DIAGNOSIS — Z299 Encounter for prophylactic measures, unspecified: Secondary | ICD-10-CM | POA: Diagnosis not present

## 2017-01-26 DIAGNOSIS — K219 Gastro-esophageal reflux disease without esophagitis: Secondary | ICD-10-CM | POA: Diagnosis not present

## 2017-01-26 DIAGNOSIS — E039 Hypothyroidism, unspecified: Secondary | ICD-10-CM | POA: Diagnosis not present

## 2017-01-26 DIAGNOSIS — N183 Chronic kidney disease, stage 3 (moderate): Secondary | ICD-10-CM | POA: Diagnosis not present

## 2017-01-26 DIAGNOSIS — Z6841 Body Mass Index (BMI) 40.0 and over, adult: Secondary | ICD-10-CM | POA: Diagnosis not present

## 2017-01-26 DIAGNOSIS — I10 Essential (primary) hypertension: Secondary | ICD-10-CM | POA: Diagnosis not present

## 2017-01-26 DIAGNOSIS — E7800 Pure hypercholesterolemia, unspecified: Secondary | ICD-10-CM | POA: Diagnosis not present

## 2017-01-26 DIAGNOSIS — I8390 Asymptomatic varicose veins of unspecified lower extremity: Secondary | ICD-10-CM | POA: Diagnosis not present

## 2017-01-30 ENCOUNTER — Telehealth: Payer: Self-pay | Admitting: Neurology

## 2017-01-30 NOTE — Telephone Encounter (Signed)
Patient called office in reference to CPAP pressure with [facility] being increased, but patient has been getting the run around.  Per patient she states she called [facility] to speak with Jonni Sanger yesterday placing patient on hold for 48 mins and the line went dead.  Patient states she is still having issues of snoring.  Please call

## 2017-02-06 DIAGNOSIS — M159 Polyosteoarthritis, unspecified: Secondary | ICD-10-CM | POA: Diagnosis not present

## 2017-02-06 DIAGNOSIS — I10 Essential (primary) hypertension: Secondary | ICD-10-CM | POA: Diagnosis not present

## 2017-02-06 DIAGNOSIS — E119 Type 2 diabetes mellitus without complications: Secondary | ICD-10-CM | POA: Diagnosis not present

## 2017-02-08 ENCOUNTER — Other Ambulatory Visit: Payer: Self-pay | Admitting: Neurology

## 2017-02-08 DIAGNOSIS — G4733 Obstructive sleep apnea (adult) (pediatric): Secondary | ICD-10-CM

## 2017-02-08 NOTE — Telephone Encounter (Signed)
Called and spoke with pt in regards to her difficulty with getting her supplies and orders fixed with [facility]. She stated that [facility] did finally get her pressure changed apx 4 days ago. I talked with her in detail and she stated she has struggled with them for 3 years. Pt was happy to change companies.  Pt had actually called today as well to make Dr Dohmeier aware that even on the new pressure setting she is having the snoring. She stated that its not every night but that it is still happening and she was instructed to make her aware. I told her I would let Dr Brett Fairy know and if she feels there needs to be anything else done I would get back with her soon. Pt verbalized understanding and was very appreciative.

## 2017-02-08 NOTE — Telephone Encounter (Signed)
Patient says pressure was increased on her CPAP but she is still snoring.

## 2017-02-13 DIAGNOSIS — E1140 Type 2 diabetes mellitus with diabetic neuropathy, unspecified: Secondary | ICD-10-CM | POA: Diagnosis not present

## 2017-02-13 DIAGNOSIS — E1151 Type 2 diabetes mellitus with diabetic peripheral angiopathy without gangrene: Secondary | ICD-10-CM | POA: Diagnosis not present

## 2017-02-28 DIAGNOSIS — Z9641 Presence of insulin pump (external) (internal): Secondary | ICD-10-CM | POA: Diagnosis not present

## 2017-02-28 DIAGNOSIS — Z299 Encounter for prophylactic measures, unspecified: Secondary | ICD-10-CM | POA: Diagnosis not present

## 2017-02-28 DIAGNOSIS — I8390 Asymptomatic varicose veins of unspecified lower extremity: Secondary | ICD-10-CM | POA: Diagnosis not present

## 2017-02-28 DIAGNOSIS — I10 Essential (primary) hypertension: Secondary | ICD-10-CM | POA: Diagnosis not present

## 2017-02-28 DIAGNOSIS — E1065 Type 1 diabetes mellitus with hyperglycemia: Secondary | ICD-10-CM | POA: Diagnosis not present

## 2017-02-28 DIAGNOSIS — N183 Chronic kidney disease, stage 3 (moderate): Secondary | ICD-10-CM | POA: Diagnosis not present

## 2017-02-28 DIAGNOSIS — E039 Hypothyroidism, unspecified: Secondary | ICD-10-CM | POA: Diagnosis not present

## 2017-02-28 DIAGNOSIS — M14679 Charcot's joint, unspecified ankle and foot: Secondary | ICD-10-CM | POA: Diagnosis not present

## 2017-02-28 DIAGNOSIS — E109 Type 1 diabetes mellitus without complications: Secondary | ICD-10-CM | POA: Diagnosis not present

## 2017-02-28 DIAGNOSIS — Z6839 Body mass index (BMI) 39.0-39.9, adult: Secondary | ICD-10-CM | POA: Diagnosis not present

## 2017-02-28 DIAGNOSIS — E7800 Pure hypercholesterolemia, unspecified: Secondary | ICD-10-CM | POA: Diagnosis not present

## 2017-02-28 DIAGNOSIS — E1122 Type 2 diabetes mellitus with diabetic chronic kidney disease: Secondary | ICD-10-CM | POA: Diagnosis not present

## 2017-03-05 ENCOUNTER — Encounter: Payer: Self-pay | Admitting: Gastroenterology

## 2017-03-05 ENCOUNTER — Ambulatory Visit (INDEPENDENT_AMBULATORY_CARE_PROVIDER_SITE_OTHER): Payer: Medicare Other | Admitting: Gastroenterology

## 2017-03-05 VITALS — BP 112/53 | HR 77 | Temp 97.2°F | Ht 69.0 in | Wt 259.0 lb

## 2017-03-05 DIAGNOSIS — R109 Unspecified abdominal pain: Secondary | ICD-10-CM

## 2017-03-05 DIAGNOSIS — K2990 Gastroduodenitis, unspecified, without bleeding: Secondary | ICD-10-CM | POA: Diagnosis not present

## 2017-03-05 DIAGNOSIS — R102 Pelvic and perineal pain: Secondary | ICD-10-CM | POA: Diagnosis not present

## 2017-03-05 DIAGNOSIS — K2970 Gastritis, unspecified, without bleeding: Secondary | ICD-10-CM

## 2017-03-05 DIAGNOSIS — R1024 Suprapubic pain: Secondary | ICD-10-CM | POA: Insufficient documentation

## 2017-03-05 MED ORDER — HYOSCYAMINE SULFATE SL 0.125 MG SL SUBL: 1.0000 | SUBLINGUAL_TABLET | Freq: Three times a day (TID) | SUBLINGUAL | 1 refills | Status: DC | PRN

## 2017-03-05 NOTE — Progress Notes (Signed)
cc'ed to pcp °

## 2017-03-05 NOTE — Assessment & Plan Note (Signed)
Right sided abdominal pain beginning earlier this year. Gallbladder not likely the culprit. CT in March with minimal ventral hernia likely asymptomatic. EGD with gastritis. She was on 80 mg of omeprazole daily at that time, now on 40 mg twice a day. No significant change in her symptoms. Denies GERD. Reports daily intermittent right-sided abdominal pain lasting for 5-10 minutes at a time. Seems to be unrelated to bowel function. Interestingly she wears a CGM in the right midabdomen and reports the need to continually move the site laterally due to discomfort and bleeding. On exam today she had moderate pain with light palpation both right midabdomen and suprapubic area. No obvious herniations. Suspect element of abdominal wall pain.  For now, trial of Levsin for possible irritable bowel. We'll also discuss case with Dr. Oneida Alar for further recommendations. Question need for reimaging given abdominal exam today.

## 2017-03-05 NOTE — Assessment & Plan Note (Signed)
Continue omeprazole 40 mg twice a day. Antireflux measures reinforced.

## 2017-03-05 NOTE — Patient Instructions (Signed)
1. Continue omeprazole 40mg  twice daily before a meal.  2. Start Levsin, place one tablet on the tongue for abdominal pain as needed.  3. I will discuss your abdominal pain further with Dr. Oneida Alar and provide any additional recommendations that she may have.

## 2017-03-05 NOTE — Progress Notes (Signed)
Primary Care Physician: Monico Blitz, MD  Primary Gastroenterologist:  Barney Drain, MD   Chief Complaint  Patient presents with  . Abdominal Pain    HPI: Jodi Ray is a 56 y.o. female here for follow up. She was seen in 09/2016 for ruq pain, nausea and diarrhea. Patient started having severe right upper quadrant pain earlier this year. Abdominal ultrasound done in PCP office in March showed mildly distended gallbladder but otherwise normal. Hepatic steatosis. Patient tender throughout the entire ultrasound scan. HIDA scan was unremarkable. He was evaluated by the surgeon, Dr. Ladona Horns who according to the patient basically told her he was not sure that her pain was associated with her gallbladder.  Patient also has several year history of abdominal cramping associated with loose stools, in the past only occasional. Progressive symptoms one last saw her.  She had CT A/P with contrast 10/25/16: fairly large amt of stool in colon. Minimal ventral hernia containing fat only. Advised to hold bentyl due to constipation.   She had EGD and colonoscopy in April with moderate gastritis but pylori. Random colon biopsies were negative. Terminal ileum appeared normal. Small bowel biopsies negative for celiac. Given family history of colon cancer her next surveillance colonoscopy planned for 5 years.  Patient states that she tried the plant-based diet Dr. Oneida Alar recommended. She reports that her endocrinologist demanded that she go back to her previous diet cause of increasing hemoglobin A1c.  She's gained 10 pounds since March. Today's to walk regularly but her physical activity is limited due to her physical limitations. Endo got upset with her, 6.9 to 7.6 on plant based diet. Told to stop it. 10 pound up since 09/2016.   Previously patient was on omeprazole 80 mg every morning. Dr. Oneida Alar had advise twice a day dosing currently she's on 40 mg twice a day. Never really had heartburn. Doesn't  know what it feels like. Continues to feel on occasion like something is stuck in her chest when she eats or drinks. Happens with coffee so she feels like is just acid. She waits a couple of minutes and passes on its own.  Her biggest concern is persistent right-sided abdominal pain, waking her up at night. Not necessarily related to food. May go couple days without a BM and then get the urge for a BM and have passive amount of stool passed. Denies associated abdominal cramping. Pain in the abdomen seems to be unrelated to her bowel function.  Most of her pain is right sided. She wears a CGM for monitoring her blood sugars. She is having to shift placement laterally due to "hitting blood vessels". Plans to discuss with Dr. Suzette Battiest. Patient also reports normal gastric emptying study recently at [facility]. Abdominal pain mostly 7-8 on a scale of 0-1010 being the worst. Pain lasts for 5-10 minutes at a time.      Current Outpatient Prescriptions  Medication Sig Dispense Refill  . aspirin EC 81 MG tablet Take 81 mg by mouth daily.    Marland Kitchen atorvastatin (LIPITOR) 20 MG tablet Take 1 tablet (20 mg total) by mouth daily. 30 tablet 11  . B-D ULTRAFINE III SHORT PEN 31G X 8 MM MISC 4 (four) times daily.  0  . Cholecalciferol (VITAMIN D-3) 5000 units TABS Take 5,000 Units by mouth daily.     . Elastic Bandages & Supports (WRIST SPLINT/COCK-UP/LEFT L) MISC 1 each by Does not apply route as directed. 1 each 0  . Elastic Bandages &  Supports (WRIST SPLINT/COCK-UP/RIGHT L) MISC 1 each by Does not apply route as directed. 1 each 0  . Fluocinolone Acetonide (DERMOTIC) 0.01 % OIL Place 1 drop into both ears at bedtime.     . gabapentin (NEURONTIN) 400 MG capsule Take 400 mg by mouth daily.    Marland Kitchen gabapentin (NEURONTIN) 800 MG tablet Take 800 mg by mouth daily.     . hydrochlorothiazide (MICROZIDE) 12.5 MG capsule Take 12.5 mg by mouth daily.    . Insulin Human (INSULIN PUMP) SOLN Inject into the skin continuous.  Novolog    . levothyroxine (SYNTHROID, LEVOTHROID) 125 MCG tablet Take 125 mcg by mouth daily before breakfast.   0  . lisinopril (PRINIVIL,ZESTRIL) 10 MG tablet take 1 tablet by mouth once daily 30 tablet 2  . loratadine (CLARITIN) 10 MG tablet Take 10 mg by mouth daily.    . metFORMIN (GLUCOPHAGE) 500 MG tablet Take 500 mg by mouth 2 (two) times daily with a meal.    . Multiple Vitamins-Minerals (CENTRUM SILVER 50+WOMEN) TABS Take 1 tablet by mouth daily.     . Omega-3 Fatty Acids (FISH OIL) 1000 MG CAPS Take 1,000 mg by mouth daily.    Marland Kitchen omeprazole (PRILOSEC) 40 MG capsule Take 40 mg by mouth 2 (two) times daily.     Marland Kitchen topiramate (TOPAMAX) 50 MG tablet take 1 tablet by mouth once daily 90 tablet 0  . triazolam (HALCION) 0.25 MG tablet Take 0.25 mg by mouth at bedtime.      No current facility-administered medications for this visit.     Allergies as of 03/05/2017 - Review Complete 03/05/2017  Allergen Reaction Noted  . Fentanyl Other (See Comments) 11/09/2016   Past Medical History:  Diagnosis Date  . 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 Anemia   . Brain aneurysm    frontal lobe  . Diabetes mellitus without complication ([place])   . Headache   . HTN (hypertension)   . Hypothyroidism   . Neuromuscular disorder ([place])   . Neuropathy   . Obesity   . OSA on CPAP 03/10/2015  . Retinopathy   . Sleep apnea   . Varicose veins of both lower extremities    Past Surgical History:  Procedure Laterality Date  . BIOPSY  11/14/2016   Procedure: BIOPSY;  Surgeon: Danie Binder, MD;  Location: [facility];  Service: Endoscopy;;  colon gastric duodenal  . BREAST SURGERY Left 1994   Lumpectomy  . COLONOSCOPY WITH PROPOFOL N/A 11/14/2016   Dr. Oneida Alar: Moderately redundant rectosigmoid colon. Random colon biopsies benign. Internal hemorrhoids. Surveillance colonoscopy in 5 years.  . ESOPHAGOGASTRODUODENOSCOPY (EGD) WITH PROPOFOL N/A 11/14/2016   Dr. Oneida Alar: Esophagus normal. Moderate gastritis, few gastric  polyps. Duodenal biopsies negative. Fundic gland polyp gastric polyp area no H pylori.  Marland Kitchen FOOT SURGERY    . LEG SURGERY Right   . REPLACEMENT TOTAL KNEE Right   . SPINAL CORD STIMULATOR IMPLANT      ROS:  General: Negative for anorexia, weight loss, fever, chills, fatigue, weakness. ENT: Negative for hoarseness, difficulty swallowing , nasal congestion. CV: Negative for chest pain, angina, palpitations, dyspnea on exertion, peripheral edema.  Respiratory: Negative for dyspnea at rest, dyspnea on exertion, cough, sputum, wheezing.  GI: See history of present illness. GU:  Negative for dysuria, hematuria, urinary incontinence, urinary frequency, nocturnal urination.  Endo: Negative for unusual weight change.    Physical Examination:   BP (!) 112/53   Pulse 77   Temp (!) 97.2  F (36.2 C) (Oral)   Ht 5\' 9"  (1.753 m)   Wt 259 lb (117.5 kg)   BMI 38.25 kg/m   General: Well-nourished, well-developed in no acute distress.  Eyes: No icterus. Mouth: Oropharyngeal mucosa moist and pink , no lesions erythema or exudate. Lungs: Clear to auscultation bilaterally.  Heart: Regular rate and rhythm, no murmurs rubs or gallops.  Abdomen: Bowel sounds are normal,  nondistended, no hepatosplenomegaly or masses, no abdominal bruits or hernia , no rebound or guarding.  Moderately tender in lower central abdomen with LIGHT palpation. Feels full, exam limited due to body habitus. No obvious herniation.  Extremities: No lower extremity edema. No clubbing or deformities. Neuro: Alert and oriented x 4   Skin: Warm and dry, no jaundice.   Psych: Alert and cooperative, normal mood and affect.

## 2017-03-07 DIAGNOSIS — E039 Hypothyroidism, unspecified: Secondary | ICD-10-CM | POA: Diagnosis not present

## 2017-03-07 DIAGNOSIS — E109 Type 1 diabetes mellitus without complications: Secondary | ICD-10-CM | POA: Diagnosis not present

## 2017-03-12 NOTE — Progress Notes (Signed)
Please let patient know I discussed her abdominal pain with [facility]. She says we do not need to repeat CT a/p at this time.  She recommended making sure constipation was not a problem. Does not appear to be a problem at this time based on her report last week.   Tell her to call with progress report in 1-2 weeks. If Levsin does not help, we can consider TCA per SLF recommendations.

## 2017-03-12 NOTE — Progress Notes (Signed)
PT is aware. She said the Levsin is helping and she will call in a week or so and let us know how things are.

## 2017-03-22 DIAGNOSIS — R799 Abnormal finding of blood chemistry, unspecified: Secondary | ICD-10-CM | POA: Diagnosis not present

## 2017-03-22 DIAGNOSIS — E039 Hypothyroidism, unspecified: Secondary | ICD-10-CM | POA: Diagnosis not present

## 2017-03-22 DIAGNOSIS — E109 Type 1 diabetes mellitus without complications: Secondary | ICD-10-CM | POA: Diagnosis not present

## 2017-04-04 ENCOUNTER — Encounter: Payer: Medicare Other | Admitting: *Deleted

## 2017-04-09 ENCOUNTER — Encounter: Payer: Self-pay | Admitting: Dietician

## 2017-04-09 ENCOUNTER — Encounter: Payer: Medicare Other | Attending: Endocrinology | Admitting: Dietician

## 2017-04-09 DIAGNOSIS — E108 Type 1 diabetes mellitus with unspecified complications: Secondary | ICD-10-CM

## 2017-04-09 DIAGNOSIS — N183 Chronic kidney disease, stage 3 unspecified: Secondary | ICD-10-CM

## 2017-04-09 DIAGNOSIS — Z6838 Body mass index (BMI) 38.0-38.9, adult: Secondary | ICD-10-CM | POA: Insufficient documentation

## 2017-04-09 DIAGNOSIS — E109 Type 1 diabetes mellitus without complications: Secondary | ICD-10-CM | POA: Insufficient documentation

## 2017-04-09 DIAGNOSIS — IMO0002 Reserved for concepts with insufficient information to code with codable children: Secondary | ICD-10-CM

## 2017-04-09 DIAGNOSIS — Z713 Dietary counseling and surveillance: Secondary | ICD-10-CM | POA: Diagnosis not present

## 2017-04-09 DIAGNOSIS — E1065 Type 1 diabetes mellitus with hyperglycemia: Secondary | ICD-10-CM

## 2017-04-09 DIAGNOSIS — E875 Hyperkalemia: Secondary | ICD-10-CM

## 2017-04-09 NOTE — Progress Notes (Signed)
  Medical Nutrition Therapy:  Appt start time: 0900 end time:  1000.   Assessment:  Primary concerns today: Patient is here today alone.  She would like to learn more about managing her CKD and particaularly her high potassium.  Hx includes type 1 diabetes on an insulin pump, HTN, hypothyroidism, and OSA on C-pap.  Labs noted 03/22/17:  GFR 55, Bun 27, creatine 1.1, Potassium 5.7, A1C 7.1%  Patient lives with her boyfriend.  She does the shopping and cooking.  She cans many foods but reduces the salt intake.  She has been trying to follow a low potassium low sodium diet.  She loves vegetables and fruits and this has been the biggest problem with the potassium limitation.  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  She reports full knowledge related to type 1 diabetes, carbohydrate counting, and insulin management.  Preferred Learning Style:   Ready  Change in progress   MEDICATIONS: see list   DIETARY INTAKE:  24-hr recall:  B ( AM): oatmeal, rice milk, 2 tablespoons applesauce (unsweetened) OR egg 1/2 English muffin with peanut butter or egg white and 1/2 apple  Snk ( AM): none  L ( PM): humus, pita bread muscadine grapes OR salad (cucumbers, radishes, tuna Snk ( PM): none D ( PM): fish (baked or broiled) or chicken (grilled or baked) or steak, potato or rice, lots of vegetables, fruit Snk ( PM): popcorn Beverages: coffee, rice milk, equal; water  Usual physical activity: 15 minutes daily on the treadmill but if she does more the rods and pins act up.      Estimated energy needs: 1500 calories 187 g carbohydrates 65 g protein 55 g fat  Progress Towards Goal(s):  In progress.   Nutritional Diagnosis:  NB-1.1 Food and nutrition-related knowledge deficit As related to renal diet.  As evidenced by patient report.    Intervention:  Nutrition education regarding renal restrictions added to her diet.  Focus was on low potassium guidelines.  She is following a low  sodium diet but discussed areas that she is not aware of.  She will continue to read labels and is familiar with Calorie [facility].  Continue to follow a low sodium diet. Limit your fruit and vegetables to 5 servings per day (low potassium choices). Limit dairy to 1 serving per day. Small portions meat. Continue to stay active. Continue to have good control of your blood sugar.  Teaching Method Utilized:  Visual Auditory  Handouts given during visit include:  Food pyramid for healthy eating with kidney disease  CKD nutrition therapy from [facility]  Barriers to learning/adherence to lifestyle change: none  Demonstrated degree of understanding via:  Teach Back   Monitoring/Evaluation:  Dietary intake, exercise, and body weight prn.

## 2017-04-09 NOTE — Patient Instructions (Signed)
Continue to follow a low sodium diet. Limit your fruit and vegetables to 5 servings per day (low potassium choices). Limit dairy to 1 serving per day. Small portions meat. Continue to stay active. Continue to have good control of your blood sugar.

## 2017-04-30 ENCOUNTER — Ambulatory Visit: Payer: Medicare Other | Admitting: Neurology

## 2017-05-01 DIAGNOSIS — I10 Essential (primary) hypertension: Secondary | ICD-10-CM | POA: Diagnosis not present

## 2017-05-01 DIAGNOSIS — E875 Hyperkalemia: Secondary | ICD-10-CM | POA: Diagnosis not present

## 2017-05-01 DIAGNOSIS — E1151 Type 2 diabetes mellitus with diabetic peripheral angiopathy without gangrene: Secondary | ICD-10-CM | POA: Diagnosis not present

## 2017-05-01 DIAGNOSIS — E1065 Type 1 diabetes mellitus with hyperglycemia: Secondary | ICD-10-CM | POA: Diagnosis not present

## 2017-05-01 DIAGNOSIS — E1140 Type 2 diabetes mellitus with diabetic neuropathy, unspecified: Secondary | ICD-10-CM | POA: Diagnosis not present

## 2017-05-01 DIAGNOSIS — N179 Acute kidney failure, unspecified: Secondary | ICD-10-CM | POA: Diagnosis not present

## 2017-05-08 DIAGNOSIS — N183 Chronic kidney disease, stage 3 (moderate): Secondary | ICD-10-CM | POA: Diagnosis not present

## 2017-05-08 DIAGNOSIS — Z6841 Body Mass Index (BMI) 40.0 and over, adult: Secondary | ICD-10-CM | POA: Diagnosis not present

## 2017-05-08 DIAGNOSIS — E1122 Type 2 diabetes mellitus with diabetic chronic kidney disease: Secondary | ICD-10-CM | POA: Diagnosis not present

## 2017-05-08 DIAGNOSIS — E1165 Type 2 diabetes mellitus with hyperglycemia: Secondary | ICD-10-CM | POA: Diagnosis not present

## 2017-05-08 DIAGNOSIS — E1142 Type 2 diabetes mellitus with diabetic polyneuropathy: Secondary | ICD-10-CM | POA: Diagnosis not present

## 2017-05-08 DIAGNOSIS — Z299 Encounter for prophylactic measures, unspecified: Secondary | ICD-10-CM | POA: Diagnosis not present

## 2017-05-21 ENCOUNTER — Ambulatory Visit (INDEPENDENT_AMBULATORY_CARE_PROVIDER_SITE_OTHER): Payer: Medicare Other | Admitting: Otolaryngology

## 2017-05-21 DIAGNOSIS — H6121 Impacted cerumen, right ear: Secondary | ICD-10-CM

## 2017-05-21 DIAGNOSIS — H608X3 Other otitis externa, bilateral: Secondary | ICD-10-CM

## 2017-05-29 DIAGNOSIS — R809 Proteinuria, unspecified: Secondary | ICD-10-CM | POA: Diagnosis not present

## 2017-05-29 DIAGNOSIS — N183 Chronic kidney disease, stage 3 (moderate): Secondary | ICD-10-CM | POA: Diagnosis not present

## 2017-05-29 DIAGNOSIS — D509 Iron deficiency anemia, unspecified: Secondary | ICD-10-CM | POA: Diagnosis not present

## 2017-05-29 DIAGNOSIS — Z1159 Encounter for screening for other viral diseases: Secondary | ICD-10-CM | POA: Diagnosis not present

## 2017-05-29 DIAGNOSIS — E559 Vitamin D deficiency, unspecified: Secondary | ICD-10-CM | POA: Diagnosis not present

## 2017-05-29 DIAGNOSIS — I129 Hypertensive chronic kidney disease with stage 1 through stage 4 chronic kidney disease, or unspecified chronic kidney disease: Secondary | ICD-10-CM | POA: Diagnosis not present

## 2017-05-29 DIAGNOSIS — Z79899 Other long term (current) drug therapy: Secondary | ICD-10-CM | POA: Diagnosis not present

## 2017-05-31 DIAGNOSIS — Z9641 Presence of insulin pump (external) (internal): Secondary | ICD-10-CM | POA: Diagnosis not present

## 2017-05-31 DIAGNOSIS — E039 Hypothyroidism, unspecified: Secondary | ICD-10-CM | POA: Diagnosis not present

## 2017-05-31 DIAGNOSIS — E109 Type 1 diabetes mellitus without complications: Secondary | ICD-10-CM | POA: Diagnosis not present

## 2017-05-31 DIAGNOSIS — E11319 Type 2 diabetes mellitus with unspecified diabetic retinopathy without macular edema: Secondary | ICD-10-CM | POA: Diagnosis not present

## 2017-05-31 DIAGNOSIS — I10 Essential (primary) hypertension: Secondary | ICD-10-CM | POA: Diagnosis not present

## 2017-05-31 DIAGNOSIS — E1140 Type 2 diabetes mellitus with diabetic neuropathy, unspecified: Secondary | ICD-10-CM | POA: Diagnosis not present

## 2017-06-05 DIAGNOSIS — I10 Essential (primary) hypertension: Secondary | ICD-10-CM | POA: Diagnosis not present

## 2017-06-05 DIAGNOSIS — E875 Hyperkalemia: Secondary | ICD-10-CM | POA: Diagnosis not present

## 2017-06-05 DIAGNOSIS — N179 Acute kidney failure, unspecified: Secondary | ICD-10-CM | POA: Diagnosis not present

## 2017-06-05 DIAGNOSIS — N182 Chronic kidney disease, stage 2 (mild): Secondary | ICD-10-CM | POA: Diagnosis not present

## 2017-06-05 DIAGNOSIS — E1129 Type 2 diabetes mellitus with other diabetic kidney complication: Secondary | ICD-10-CM | POA: Diagnosis not present

## 2017-06-25 DIAGNOSIS — E113593 Type 2 diabetes mellitus with proliferative diabetic retinopathy without macular edema, bilateral: Secondary | ICD-10-CM | POA: Diagnosis not present

## 2017-06-27 ENCOUNTER — Other Ambulatory Visit: Payer: Self-pay | Admitting: Neurology

## 2017-07-17 DIAGNOSIS — E1151 Type 2 diabetes mellitus with diabetic peripheral angiopathy without gangrene: Secondary | ICD-10-CM | POA: Diagnosis not present

## 2017-07-17 DIAGNOSIS — E1140 Type 2 diabetes mellitus with diabetic neuropathy, unspecified: Secondary | ICD-10-CM | POA: Diagnosis not present

## 2017-07-21 DIAGNOSIS — I129 Hypertensive chronic kidney disease with stage 1 through stage 4 chronic kidney disease, or unspecified chronic kidney disease: Secondary | ICD-10-CM | POA: Diagnosis not present

## 2017-07-21 DIAGNOSIS — Z79899 Other long term (current) drug therapy: Secondary | ICD-10-CM | POA: Diagnosis not present

## 2017-07-21 DIAGNOSIS — Z886 Allergy status to analgesic agent status: Secondary | ICD-10-CM | POA: Diagnosis not present

## 2017-07-21 DIAGNOSIS — T85614A Breakdown (mechanical) of insulin pump, initial encounter: Secondary | ICD-10-CM | POA: Diagnosis present

## 2017-07-21 DIAGNOSIS — K219 Gastro-esophageal reflux disease without esophagitis: Secondary | ICD-10-CM | POA: Diagnosis present

## 2017-07-21 DIAGNOSIS — E1010 Type 1 diabetes mellitus with ketoacidosis without coma: Secondary | ICD-10-CM | POA: Diagnosis not present

## 2017-07-21 DIAGNOSIS — R7989 Other specified abnormal findings of blood chemistry: Secondary | ICD-10-CM | POA: Diagnosis not present

## 2017-07-21 DIAGNOSIS — R739 Hyperglycemia, unspecified: Secondary | ICD-10-CM | POA: Diagnosis not present

## 2017-07-21 DIAGNOSIS — N183 Chronic kidney disease, stage 3 (moderate): Secondary | ICD-10-CM | POA: Diagnosis not present

## 2017-07-21 DIAGNOSIS — E039 Hypothyroidism, unspecified: Secondary | ICD-10-CM | POA: Diagnosis present

## 2017-07-21 DIAGNOSIS — E1022 Type 1 diabetes mellitus with diabetic chronic kidney disease: Secondary | ICD-10-CM | POA: Diagnosis present

## 2017-07-21 DIAGNOSIS — R9431 Abnormal electrocardiogram [ECG] [EKG]: Secondary | ICD-10-CM | POA: Diagnosis not present

## 2017-07-21 DIAGNOSIS — E1065 Type 1 diabetes mellitus with hyperglycemia: Secondary | ICD-10-CM | POA: Diagnosis present

## 2017-07-21 DIAGNOSIS — Z794 Long term (current) use of insulin: Secondary | ICD-10-CM | POA: Diagnosis not present

## 2017-08-01 DIAGNOSIS — Z6841 Body Mass Index (BMI) 40.0 and over, adult: Secondary | ICD-10-CM | POA: Diagnosis not present

## 2017-08-01 DIAGNOSIS — E1122 Type 2 diabetes mellitus with diabetic chronic kidney disease: Secondary | ICD-10-CM | POA: Diagnosis not present

## 2017-08-01 DIAGNOSIS — Z299 Encounter for prophylactic measures, unspecified: Secondary | ICD-10-CM | POA: Diagnosis not present

## 2017-08-01 DIAGNOSIS — E1165 Type 2 diabetes mellitus with hyperglycemia: Secondary | ICD-10-CM | POA: Diagnosis not present

## 2017-08-01 DIAGNOSIS — N183 Chronic kidney disease, stage 3 (moderate): Secondary | ICD-10-CM | POA: Diagnosis not present

## 2017-08-01 DIAGNOSIS — E1142 Type 2 diabetes mellitus with diabetic polyneuropathy: Secondary | ICD-10-CM | POA: Diagnosis not present

## 2017-08-01 DIAGNOSIS — J069 Acute upper respiratory infection, unspecified: Secondary | ICD-10-CM | POA: Diagnosis not present

## 2017-08-29 DIAGNOSIS — I10 Essential (primary) hypertension: Secondary | ICD-10-CM | POA: Diagnosis not present

## 2017-08-29 DIAGNOSIS — M159 Polyosteoarthritis, unspecified: Secondary | ICD-10-CM | POA: Diagnosis not present

## 2017-08-29 DIAGNOSIS — E119 Type 2 diabetes mellitus without complications: Secondary | ICD-10-CM | POA: Diagnosis not present

## 2017-09-24 DIAGNOSIS — E1151 Type 2 diabetes mellitus with diabetic peripheral angiopathy without gangrene: Secondary | ICD-10-CM | POA: Diagnosis not present

## 2017-09-24 DIAGNOSIS — E1140 Type 2 diabetes mellitus with diabetic neuropathy, unspecified: Secondary | ICD-10-CM | POA: Diagnosis not present

## 2017-10-02 DIAGNOSIS — E109 Type 1 diabetes mellitus without complications: Secondary | ICD-10-CM | POA: Diagnosis not present

## 2017-10-02 DIAGNOSIS — I10 Essential (primary) hypertension: Secondary | ICD-10-CM | POA: Diagnosis not present

## 2017-10-02 DIAGNOSIS — E039 Hypothyroidism, unspecified: Secondary | ICD-10-CM | POA: Diagnosis not present

## 2017-10-02 DIAGNOSIS — Z9641 Presence of insulin pump (external) (internal): Secondary | ICD-10-CM | POA: Diagnosis not present

## 2017-10-08 DIAGNOSIS — I10 Essential (primary) hypertension: Secondary | ICD-10-CM | POA: Diagnosis not present

## 2017-10-08 DIAGNOSIS — E119 Type 2 diabetes mellitus without complications: Secondary | ICD-10-CM | POA: Diagnosis not present

## 2017-10-08 DIAGNOSIS — M159 Polyosteoarthritis, unspecified: Secondary | ICD-10-CM | POA: Diagnosis not present

## 2017-11-12 DIAGNOSIS — Z7189 Other specified counseling: Secondary | ICD-10-CM | POA: Diagnosis not present

## 2017-11-12 DIAGNOSIS — Z299 Encounter for prophylactic measures, unspecified: Secondary | ICD-10-CM | POA: Diagnosis not present

## 2017-11-12 DIAGNOSIS — E1165 Type 2 diabetes mellitus with hyperglycemia: Secondary | ICD-10-CM | POA: Diagnosis not present

## 2017-11-12 DIAGNOSIS — E1122 Type 2 diabetes mellitus with diabetic chronic kidney disease: Secondary | ICD-10-CM | POA: Diagnosis not present

## 2017-11-12 DIAGNOSIS — I10 Essential (primary) hypertension: Secondary | ICD-10-CM | POA: Diagnosis not present

## 2017-11-12 DIAGNOSIS — Z1339 Encounter for screening examination for other mental health and behavioral disorders: Secondary | ICD-10-CM | POA: Diagnosis not present

## 2017-11-12 DIAGNOSIS — Z0000 Encounter for general adult medical examination without abnormal findings: Secondary | ICD-10-CM | POA: Diagnosis not present

## 2017-11-12 DIAGNOSIS — E1142 Type 2 diabetes mellitus with diabetic polyneuropathy: Secondary | ICD-10-CM | POA: Diagnosis not present

## 2017-11-12 DIAGNOSIS — Z1331 Encounter for screening for depression: Secondary | ICD-10-CM | POA: Diagnosis not present

## 2017-11-12 DIAGNOSIS — Z1211 Encounter for screening for malignant neoplasm of colon: Secondary | ICD-10-CM | POA: Diagnosis not present

## 2017-11-12 DIAGNOSIS — N183 Chronic kidney disease, stage 3 (moderate): Secondary | ICD-10-CM | POA: Diagnosis not present

## 2017-11-12 DIAGNOSIS — Z6841 Body Mass Index (BMI) 40.0 and over, adult: Secondary | ICD-10-CM | POA: Diagnosis not present

## 2017-11-14 DIAGNOSIS — Z79899 Other long term (current) drug therapy: Secondary | ICD-10-CM | POA: Diagnosis not present

## 2017-11-14 DIAGNOSIS — E039 Hypothyroidism, unspecified: Secondary | ICD-10-CM | POA: Diagnosis not present

## 2017-11-14 DIAGNOSIS — I10 Essential (primary) hypertension: Secondary | ICD-10-CM | POA: Diagnosis not present

## 2017-11-14 DIAGNOSIS — R5383 Other fatigue: Secondary | ICD-10-CM | POA: Diagnosis not present

## 2017-11-21 ENCOUNTER — Telehealth: Payer: Self-pay | Admitting: Cardiovascular Disease

## 2017-11-21 ENCOUNTER — Other Ambulatory Visit: Payer: Self-pay

## 2017-11-21 MED ORDER — ATORVASTATIN CALCIUM 20 MG PO TABS: 20.0000 mg | ORAL_TABLET | Freq: Every day | ORAL | 3 refills | Status: DC

## 2017-11-21 NOTE — Telephone Encounter (Signed)
Needs RX for Atorvastatin sent to [facility] due to switching pharmacies. / tg

## 2017-11-21 NOTE — Telephone Encounter (Signed)
Refill sent.

## 2017-11-22 ENCOUNTER — Encounter: Payer: Self-pay | Admitting: Neurology

## 2017-12-13 DIAGNOSIS — N183 Chronic kidney disease, stage 3 (moderate): Secondary | ICD-10-CM | POA: Diagnosis not present

## 2017-12-13 DIAGNOSIS — E559 Vitamin D deficiency, unspecified: Secondary | ICD-10-CM | POA: Diagnosis not present

## 2017-12-13 DIAGNOSIS — I129 Hypertensive chronic kidney disease with stage 1 through stage 4 chronic kidney disease, or unspecified chronic kidney disease: Secondary | ICD-10-CM | POA: Diagnosis not present

## 2017-12-13 DIAGNOSIS — R809 Proteinuria, unspecified: Secondary | ICD-10-CM | POA: Diagnosis not present

## 2017-12-13 DIAGNOSIS — Z79899 Other long term (current) drug therapy: Secondary | ICD-10-CM | POA: Diagnosis not present

## 2017-12-13 DIAGNOSIS — D509 Iron deficiency anemia, unspecified: Secondary | ICD-10-CM | POA: Diagnosis not present

## 2017-12-18 DIAGNOSIS — I10 Essential (primary) hypertension: Secondary | ICD-10-CM | POA: Diagnosis not present

## 2017-12-18 DIAGNOSIS — G4733 Obstructive sleep apnea (adult) (pediatric): Secondary | ICD-10-CM | POA: Diagnosis not present

## 2017-12-18 DIAGNOSIS — E559 Vitamin D deficiency, unspecified: Secondary | ICD-10-CM | POA: Diagnosis not present

## 2017-12-18 DIAGNOSIS — N183 Chronic kidney disease, stage 3 (moderate): Secondary | ICD-10-CM | POA: Diagnosis not present

## 2017-12-18 DIAGNOSIS — E1129 Type 2 diabetes mellitus with other diabetic kidney complication: Secondary | ICD-10-CM | POA: Diagnosis not present

## 2017-12-21 DIAGNOSIS — E1140 Type 2 diabetes mellitus with diabetic neuropathy, unspecified: Secondary | ICD-10-CM | POA: Diagnosis not present

## 2017-12-21 DIAGNOSIS — E1151 Type 2 diabetes mellitus with diabetic peripheral angiopathy without gangrene: Secondary | ICD-10-CM | POA: Diagnosis not present

## 2017-12-28 DIAGNOSIS — M159 Polyosteoarthritis, unspecified: Secondary | ICD-10-CM | POA: Diagnosis not present

## 2017-12-28 DIAGNOSIS — E119 Type 2 diabetes mellitus without complications: Secondary | ICD-10-CM | POA: Diagnosis not present

## 2017-12-28 DIAGNOSIS — I10 Essential (primary) hypertension: Secondary | ICD-10-CM | POA: Diagnosis not present

## 2018-01-04 ENCOUNTER — Other Ambulatory Visit: Payer: Self-pay | Admitting: Neurology

## 2018-01-08 ENCOUNTER — Other Ambulatory Visit: Payer: Self-pay | Admitting: Cardiovascular Disease

## 2018-01-09 ENCOUNTER — Ambulatory Visit: Payer: Medicare Other | Admitting: Neurology

## 2018-01-15 ENCOUNTER — Ambulatory Visit: Payer: Medicare Other | Admitting: Neurology

## 2018-01-23 ENCOUNTER — Ambulatory Visit: Payer: Medicare Other | Admitting: Neurology

## 2018-01-25 DIAGNOSIS — E103593 Type 1 diabetes mellitus with proliferative diabetic retinopathy without macular edema, bilateral: Secondary | ICD-10-CM | POA: Diagnosis not present

## 2018-01-25 IMAGING — CT CT ABD-PELV W/ CM
2 of 4 series · 15 of 46 positions shown, 17 images · IV contrast (Isovue)
Comparison: None.

CLINICAL DATA: Right-sided abdominal pain with nausea for 2.5 weeks

EXAM:
CT ABDOMEN AND PELVIS WITH CONTRAST
TECHNIQUE: Multidetector CT imaging of the abdomen and pelvis was performed
using the standard protocol following bolus administration of
intravenous contrast. Oral contrast was also administered.
CONTRAST:  100 mL Isovue 370 nonionic

[Series 2: axial st · axial · 0.82mm/px · z∈[-344,+96]mm · 12 of 98 slices shown, 14 images]
[im 5/98  soft-tissue]
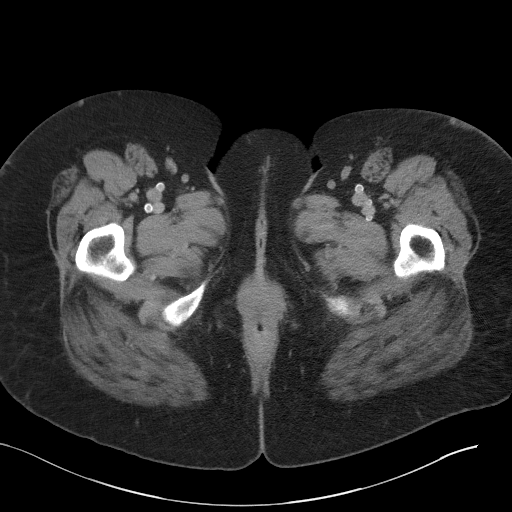
[im 5/98  bone]
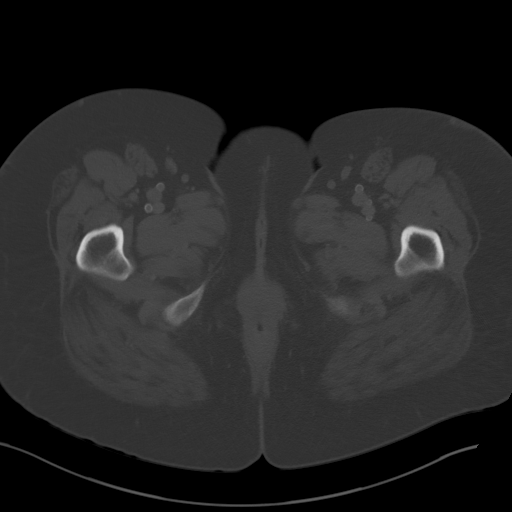
[im 14/98  soft-tissue]
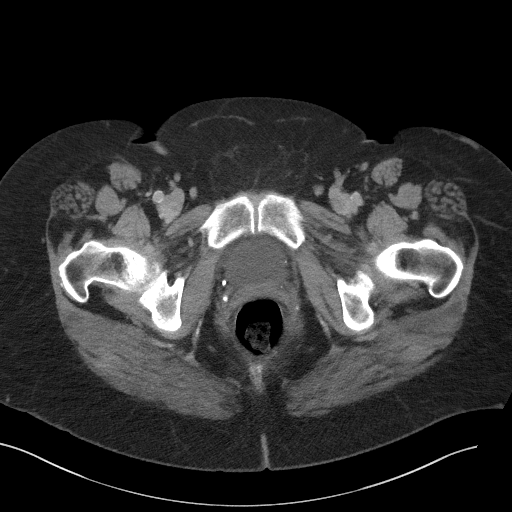
[im 23/98  soft-tissue]
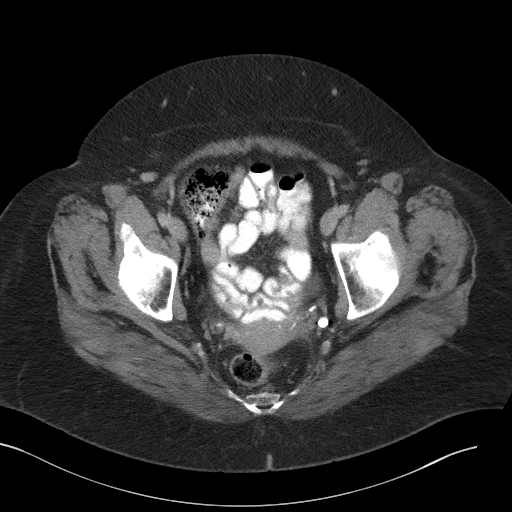
[im 31/98  soft-tissue]
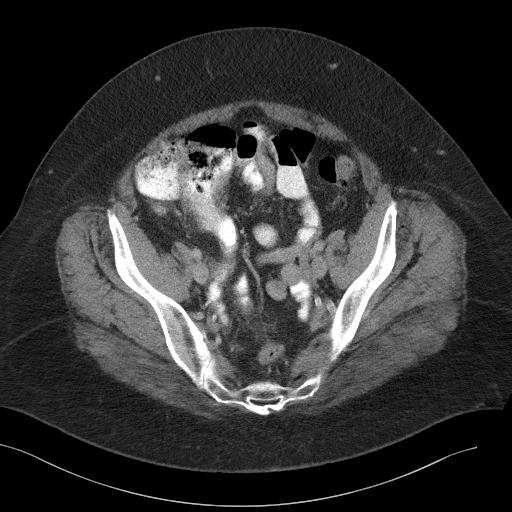
[im 36/98  soft-tissue]
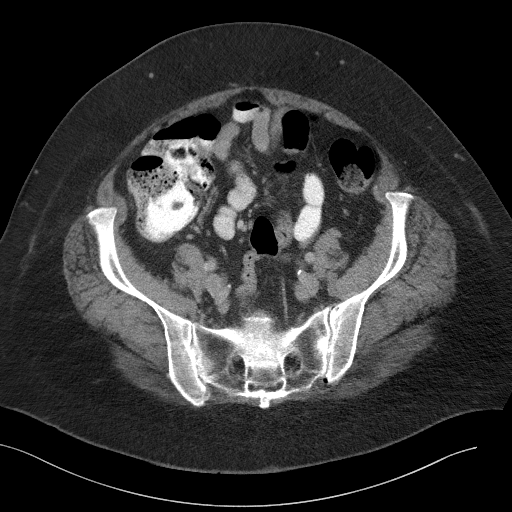
[im 45/98  soft-tissue]
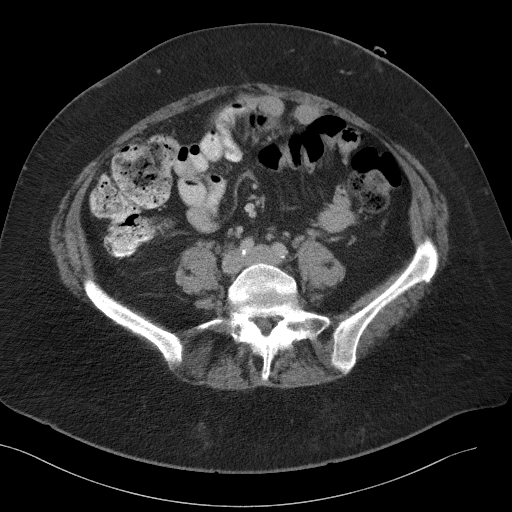
[im 53/98  soft-tissue]
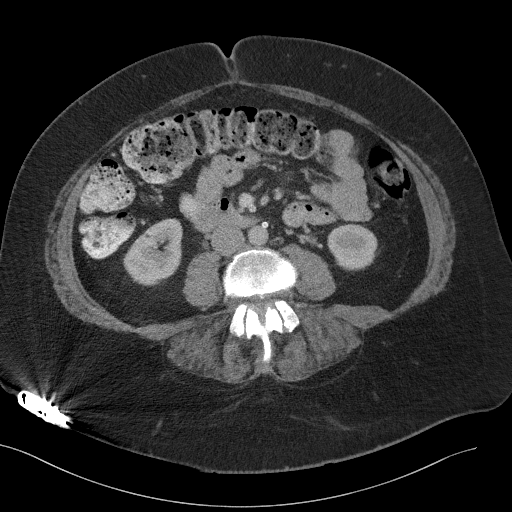
[im 62/98  soft-tissue]
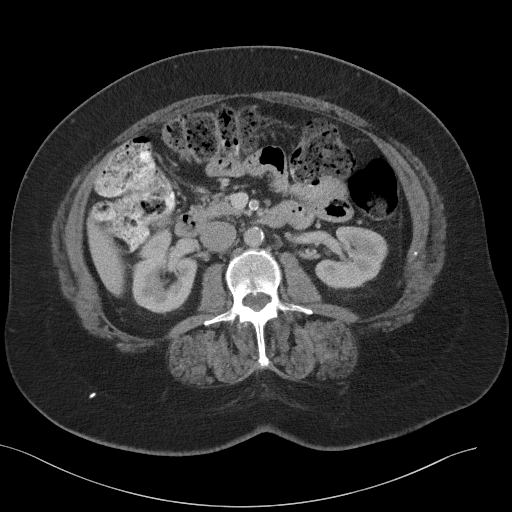
[im 67/98  soft-tissue]
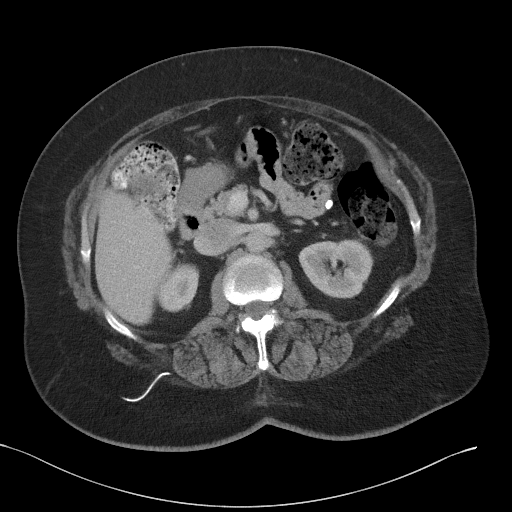
[im 67/98  bone]
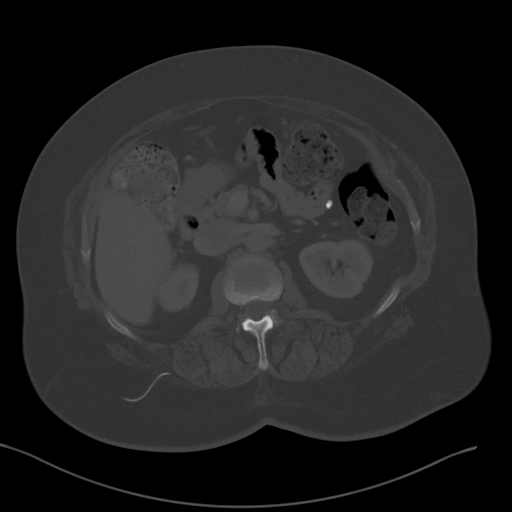
[im 75/98  soft-tissue]
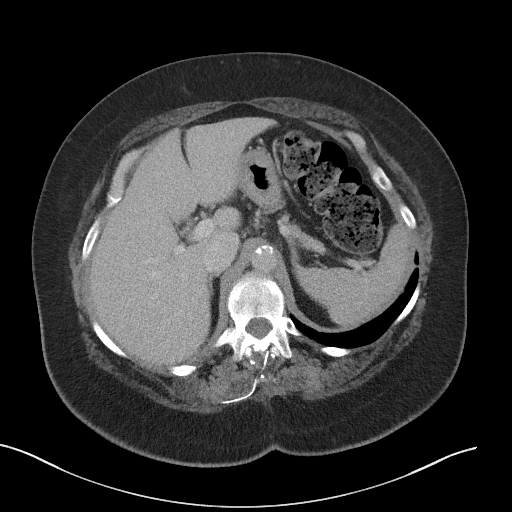
[im 84/98  soft-tissue]
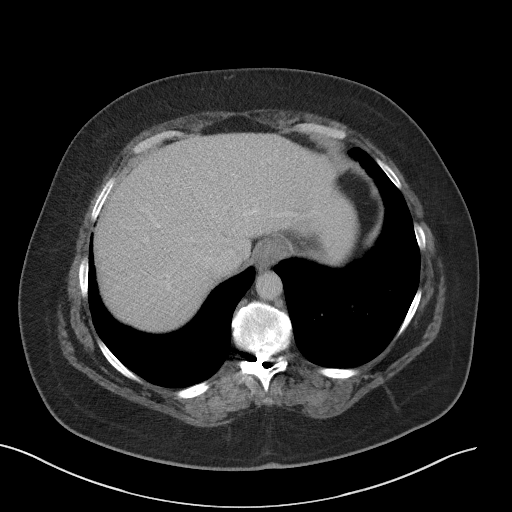
[im 93/98  soft-tissue]
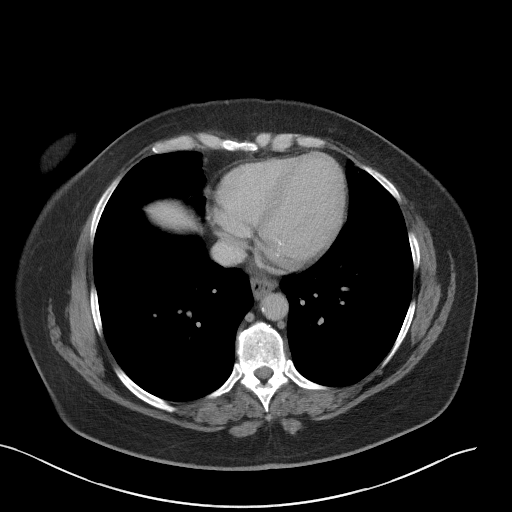

[Series 6: coronal st · coronal · 0.85mm/px · 3 of 117 slices shown]
[im 39/117  soft-tissue]
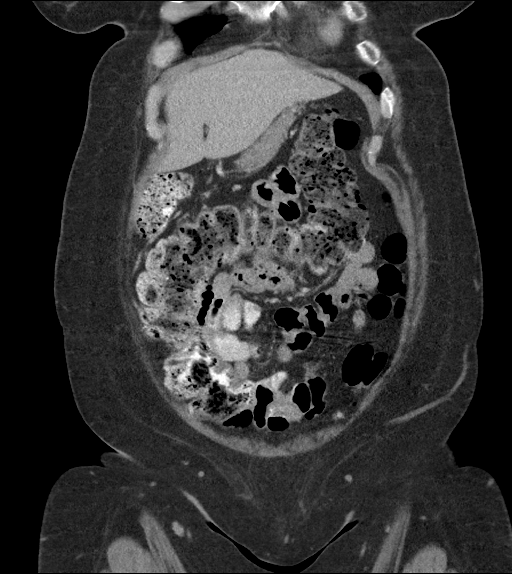
[im 52/117  soft-tissue]
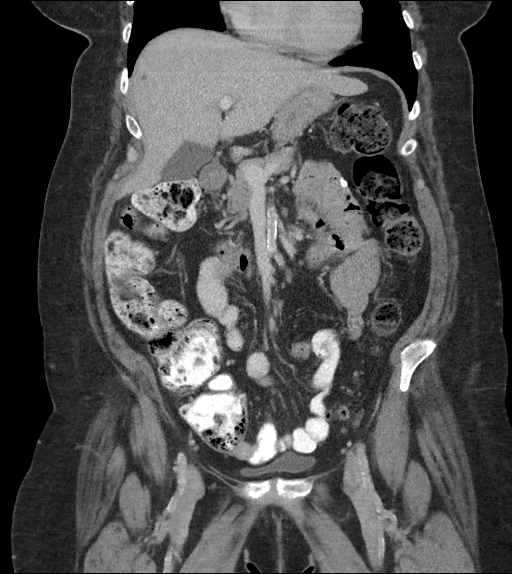
[im 65/117  soft-tissue]
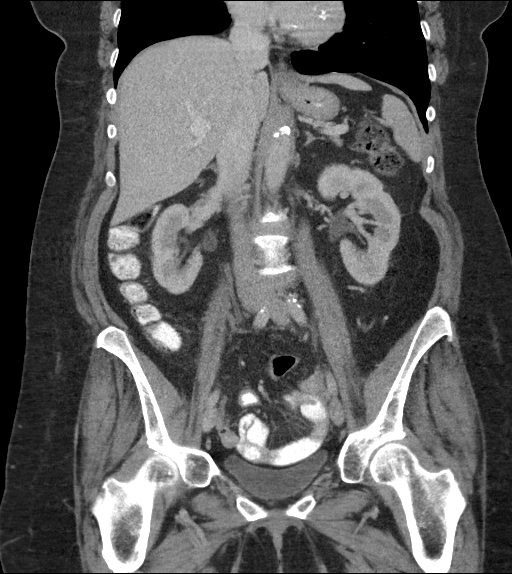

[15 of 46 positions shown; findings below may reference images not displayed]

FINDINGS: Lower chest: There is a 8 x 5 mm nodular opacity abutting the pleura
in the superior segment left lower lobe. There is also mild scarring
in the anterior left base region. There is a small hiatal hernia.

Hepatobiliary: There is a 3 mm cyst in the anterior segment of the
right lobe of the liver near the dome. No other focal liver lesions
are evident. Gallbladder wall is not appreciably thickened. There is
no biliary duct dilatation.

Pancreas: No pancreatic mass or inflammatory focus.

Spleen: No splenic lesions are evident.

Adrenals/Urinary Tract: Adrenals appear normal bilaterally. Kidneys
bilaterally show no evident mass or hydronephrosis on either side.
There is no renal or ureteral calculus on either side. The urinary
bladder is midline with wall thickness within normal limits.

Stomach/Bowel: Rectum is borderline distended with air and stool.
There is no rectal wall thickening. There is fairly diffuse stool
throughout much of the colon. There is no appreciable bowel wall or
mesenteric thickening. There is no evident bowel obstruction. No
free air or portal venous air.

Vascular/Lymphatic: There is atherosclerotic calcification in the
aorta and common iliac arteries. There is calcification in both
hypogastric and common femoral arteries as well. There is no
abdominal aortic aneurysm. Major mesenteric vessels appear patent.
There is no appreciable adenopathy in the abdomen or pelvis.

Reproductive: Uterus is midline.  No pelvic mass is apparent.

Other: Appendix appears unremarkable. No ascites or abscess apparent
in the abdomen or pelvis. There is a minimal ventral hernia
containing only fat.

Musculoskeletal: Thoracic stimulator is present on the right
laterally. The tip of the stimulator is in the lower thoracic
region. There is degenerative change in the lower lumbar region.
There are no blastic or lytic bone lesions. No intramuscular or
abdominal wall lesions.
IMPRESSION: 8 x 5 mm nodular opacity abutting the pleura in the superior segment
left lower lobe. Non-contrast chest CT at 6-12 months is
recommended. If the nodule is stable at time of repeat CT, then
future CT at 18-24 months (from today's scan) is considered optional
for low-risk patients, but is recommended for high-risk patients.
This recommendation follows the consensus statement: Guidelines for
Management of Incidental Pulmonary Nodules Detected on CT Images:

Fairly large amount of stool in colon. Question a degree of
constipation.

No bowel obstruction or bowel wall thickening. No abscess. Appendix
appears unremarkable.

No renal or ureteral calculus.  No hydronephrosis.

Aortoiliac atherosclerosis. Thoracic stimulator with tips in lower
thoracic region. Small hiatal hernia.

## 2018-01-28 ENCOUNTER — Ambulatory Visit: Payer: Medicare Other | Admitting: Neurology

## 2018-02-01 DIAGNOSIS — E109 Type 1 diabetes mellitus without complications: Secondary | ICD-10-CM | POA: Diagnosis not present

## 2018-02-01 DIAGNOSIS — E039 Hypothyroidism, unspecified: Secondary | ICD-10-CM | POA: Diagnosis not present

## 2018-02-01 DIAGNOSIS — I10 Essential (primary) hypertension: Secondary | ICD-10-CM | POA: Diagnosis not present

## 2018-02-01 DIAGNOSIS — Z9641 Presence of insulin pump (external) (internal): Secondary | ICD-10-CM | POA: Diagnosis not present

## 2018-02-07 ENCOUNTER — Ambulatory Visit: Payer: Medicare Other | Admitting: Neurology

## 2018-02-08 ENCOUNTER — Encounter: Payer: Self-pay | Admitting: Neurology

## 2018-02-08 ENCOUNTER — Ambulatory Visit (INDEPENDENT_AMBULATORY_CARE_PROVIDER_SITE_OTHER): Payer: Medicare Other | Admitting: Neurology

## 2018-02-08 VITALS — BP 134/64 | HR 80 | Ht 69.0 in | Wt 259.0 lb

## 2018-02-08 DIAGNOSIS — G4485 Primary stabbing headache: Secondary | ICD-10-CM | POA: Diagnosis not present

## 2018-02-08 NOTE — Progress Notes (Signed)
NEUROLOGY FOLLOW UP OFFICE NOTE  Jodi Ray [id number]  HISTORY OF PRESENT ILLNESS: Jodi Ray is a 57 year old right-handed woman with chronic pain related to complex regional pain syndrome (with spinal stimulator), hypertension, type I diabetes mellitus with neuropathy, OSA on CPAP, hypothyroidism, polymyalgia rheumatica, and morbid obesity who follows up for primary stabbing headache.   UPDATE: Current preventative therapy:  Topiramate 50mg  Headaches are well controlled.  They are dull 4/10 pain, lasting 30 minutes and occuring about 3 times a month.  Change in weather seems to be a trigger. She ran out of topiramate several weeks ago and her headaches returned.  She has been back on topiramate for about a month and they are much better controlled.    HISTORY: Primary Stabbing Headache: Onset:  2011.  She was previously treated in [place], where she was diagnosed with primary stabbing headache. Location:  Right frontal region Quality:  stabbing Intensity:  10/10 Aura:  no Prodrome:  no Associated symptoms:  Some nausea if severe. No autonomic symptoms. Duration:  10 to 60 minutes Frequency:  4 times a week Triggers/exacerbating factors:  change in weather. Relieving factors:  none Activity:  Cannot function when experiencing it.   Past preventative therapy:  Indomethacin 25mg  three times daily (effective but discontinued due to elevated liver enzymes), Verapamil (ineffective)   Migraines:  They are bi-frontal/maxillary and associated with slight nausea.  She has had this before when her allergies "act up."     Diabetic neuropathy:  Secondary to Type 1 diabetes.   Complex regional pain syndrome:  following an accident where she fell down the steps and crushed her right leg.  She takes gabapentin, tramadol and has a spinal nerve stimulator.  She takes this for her painful diabetic neuropathy as well.   Right arm pain and numbness:  Since 2015, she has had  episodes of right arm pain and numbness.  It only occurs when she is laying in bed, and occurs no matter what position.  Her entire arm goes "dead".  It is both numb, painful and unable to move it.  There is no shooting pain down the arm from the neck, however she gets occasional shooting pain from the right side of her neck into the shoulder.  She has to use her other arm to shake it out and it resolves in a minute or two.  It occurs every night.  She was sent to pain management for possible cervical radiculopathy or thoracic outlet syndrome.  She did receive injections, which helped.  She had an MRI of the cervical and thoracic spine on 06/12/14.  Imaging not available, but report mentions diffuse facet arthropathy and degenerative changes in the cervical spine, but no nerve root impingement or cord compression.  There was no evidence of thoracic outlet syndrome.  She was sent to [facility] for further evaluation.  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  They told her that her symptoms were related to her diabetic neuropathy.   Cerebral aneurysm:  In 2009, she had a CTA of the head which reportedly showed a 1-52mm aneurysm at the junction of right A1 and A2 segment of the ACA.  However, a repeat CTA performed on 02/22/12 did not reveal any aneurysm.    PAST MEDICAL HISTORY: Past Medical History:  Diagnosis Date  . 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 Anemia   .  Brain aneurysm    frontal lobe  . Diabetes mellitus without complication ([place])   . Headache   . HTN (hypertension)   . Hypothyroidism   . Neuromuscular disorder ([place])   . Neuropathy   . Obesity   . OSA on CPAP 03/10/2015  . Retinopathy   . Sleep apnea   . Varicose veins of both lower extremities     MEDICATIONS: Current Outpatient Medications on File Prior to Visit  Medication Sig Dispense Refill  . aspirin EC  81 MG tablet Take 81 mg by mouth daily.    Marland Kitchen atorvastatin (LIPITOR) 20 MG tablet TAKE 1 TABLET BY MOUTH ONCE DAILY 90 tablet 0  . B-D ULTRAFINE III SHORT PEN 31G X 8 MM MISC 4 (four) times daily.  0  . Cholecalciferol (VITAMIN D-3) 5000 units TABS Take 5,000 Units by mouth daily.     Marland Kitchen dicyclomine (BENTYL) 10 MG capsule Take 10 mg by mouth 4 (four) times daily -  before meals and at bedtime.    . Elastic Bandages & Supports (WRIST SPLINT/COCK-UP/LEFT L) MISC 1 each by Does not apply route as directed. 1 each 0  . Elastic Bandages & Supports (WRIST SPLINT/COCK-UP/RIGHT L) MISC 1 each by Does not apply route as directed. 1 each 0  . Fluocinolone Acetonide (DERMOTIC) 0.01 % OIL Place 1 drop into both ears at bedtime.     . gabapentin (NEURONTIN) 400 MG capsule Take 400 mg by mouth daily.    Marland Kitchen gabapentin (NEURONTIN) 800 MG tablet Take 800 mg by mouth daily.     . hydrochlorothiazide (MICROZIDE) 12.5 MG capsule Take 12.5 mg by mouth daily.    Marland Kitchen Hyoscyamine Sulfate SL (LEVSIN/SL) 0.125 MG SUBL Place 1 tablet under the tongue 3 (three) times daily as needed. abd pain 90 each 1  . Insulin Human (INSULIN PUMP) SOLN Inject into the skin continuous. Novolog    . levothyroxine (SYNTHROID, LEVOTHROID) 125 MCG tablet Take 125 mcg by mouth daily before breakfast.   0  . lisinopril (PRINIVIL,ZESTRIL) 10 MG tablet take 1 tablet by mouth once daily 30 tablet 2  . loratadine (CLARITIN) 10 MG tablet Take 10 mg by mouth daily.    . metFORMIN (GLUCOPHAGE) 500 MG tablet Take 500 mg by mouth 2 (two) times daily with a meal.    . Multiple Vitamins-Minerals (CENTRUM SILVER 50+WOMEN) TABS Take 1 tablet by mouth daily.     . Omega-3 Fatty Acids (FISH OIL) 1000 MG CAPS Take 1,000 mg by mouth daily.    Marland Kitchen omeprazole (PRILOSEC) 40 MG capsule Take 40 mg by mouth 2 (two) times daily.     Marland Kitchen topiramate (TOPAMAX) 50 MG tablet TAKE 1 TABLET BY MOUTH ONCE DAILY 90 tablet 0  . triazolam (HALCION) 0.25 MG tablet Take 0.25 mg by mouth  at bedtime.      No current facility-administered medications on file prior to visit.     ALLERGIES: Allergies  Allergen Reactions  . Fentanyl Other (See Comments)    Nausea, vomiting, loss of consciousness, requiring reversal    FAMILY HISTORY: Family History  Problem Relation Age of Onset  . Cancer Mother 40       colon  . Cancer Father        renal cell  . Cancer Brother        bladder  . Cancer Sister        primary brain, and primary breast too  . Cancer Sister  breast   . Cancer Other        multiple aunts with breast, cervical, or colon cancer    SOCIAL HISTORY: Social History   Socioeconomic History  . Marital status: Widowed    Spouse name: Not on file  . Number of children: Not on file  . Years of education: Not on file  . Highest education level: Not on file  Occupational History  . Not on file  Social Needs  . Financial resource strain: Not on file  . Food insecurity:    Worry: Not on file    Inability: Not on file  . Transportation needs:    Medical: Not on file    Non-medical: Not on file  Tobacco Use  . Smoking status: Never Smoker  . Smokeless tobacco: Never Used  Substance and Sexual Activity  . Alcohol use: No    Alcohol/week: 0.0 oz  . Drug use: No  . Sexual activity: Yes    Partners: Male  Lifestyle  . Physical activity:    Days per week: Not on file    Minutes per session: Not on file  . Stress: Not on file  Relationships  . Social connections:    Talks on phone: Not on file    Gets together: Not on file    Attends religious service: Not on file    Active member of club or organization: Not on file    Attends meetings of clubs or organizations: Not on file    Relationship status: Not on file  . Intimate partner violence:    Fear of current or ex partner: Not on file    Emotionally abused: Not on file    Physically abused: Not on file    Forced sexual activity: Not on file  Other Topics Concern  . Not on file    Social History Narrative   Drinks about 1-2 cups of caffeine daily.    REVIEW OF SYSTEMS: Constitutional: No fevers, chills, or sweats, no generalized fatigue, change in appetite Eyes: No visual changes, double vision, eye pain Ear, nose and throat: No hearing loss, ear pain, nasal congestion, sore throat Cardiovascular: No chest pain, palpitations Respiratory:  No shortness of breath at rest or with exertion, wheezes GastrointestinaI: No nausea, vomiting, diarrhea, abdominal pain, fecal incontinence Genitourinary:  No dysuria, urinary retention or frequency Musculoskeletal:  No neck pain, back pain Integumentary: No rash, pruritus, skin lesions Neurological: as above Psychiatric: No depression, insomnia, anxiety Endocrine: No palpitations, fatigue, diaphoresis, mood swings, change in appetite, change in weight, increased thirst Hematologic/Lymphatic:  No purpura, petechiae. Allergic/Immunologic: no itchy/runny eyes, nasal congestion, recent allergic reactions, rashes  PHYSICAL EXAM: Vitals:   02/08/18 0949  BP: 134/64  Pulse: 80  SpO2: 96%   General: No acute distress.  Patient appears well-groomed.   Head:  Normocephalic/atraumatic Eyes:  Fundi examined but not visualized Neck: supple, no paraspinal tenderness, full range of motion Heart:  Regular rate and rhythm Lungs:  Clear to auscultation bilaterally Back: No paraspinal tenderness Neurological Exam: alert and oriented to person, place, and time. Attention span and concentration intact, recent and remote memory intact, fund of knowledge intact.Speech fluent and not dysarthric, language intact.CN II-XII intact. Bulk and tone normal, decreased effort testing muscle strength in all extremities due to pain..Decreased temperature and vibration sensation in lower extremities.Deep tendon reflexes 1+ throughout except absent in ankles.Finger to nose testing intact.Gait with right limp.  IMPRESSION: Primary stabbing  headache, controlled  PLAN: 1.  Continue topiramate  50mg  at bedtime 2.  Follow up in one year or as needed.  20 minutes spent face to face with patient, over 50% spent discussing management.  Metta Clines, DO  CC:  Dr. Manuella Ghazi

## 2018-02-11 DIAGNOSIS — E113592 Type 2 diabetes mellitus with proliferative diabetic retinopathy without macular edema, left eye: Secondary | ICD-10-CM | POA: Diagnosis not present

## 2018-02-12 ENCOUNTER — Encounter: Payer: Self-pay | Admitting: Neurology

## 2018-02-14 ENCOUNTER — Encounter: Payer: Self-pay | Admitting: Neurology

## 2018-02-14 ENCOUNTER — Ambulatory Visit (INDEPENDENT_AMBULATORY_CARE_PROVIDER_SITE_OTHER): Payer: Medicare Other | Admitting: Neurology

## 2018-02-14 VITALS — BP 162/78 | HR 82 | Ht 69.0 in | Wt 255.0 lb

## 2018-02-14 DIAGNOSIS — Z9989 Dependence on other enabling machines and devices: Secondary | ICD-10-CM | POA: Diagnosis not present

## 2018-02-14 DIAGNOSIS — G4733 Obstructive sleep apnea (adult) (pediatric): Secondary | ICD-10-CM | POA: Diagnosis not present

## 2018-02-14 NOTE — Progress Notes (Signed)
SLEEP MEDICINE CLINIC   Provider:  Larey Seat, M D  Referring Provider: Monico Blitz, MD Primary Care Physician:  Monico Blitz, MD  Chief Complaint  Patient presents with  . Follow-up    pt alone, rm 10. DME [facility]. pt states that the machine is working well     HPI:  Jodi Ray is a 57 y.o. female , seen here as a referral from [facility] for transfer of sleep care,  Jodi Ray.  is a 57 year old Caucasian female who is also followed by Dr. Metta Clines, DO.  She was diagnosed with sleep apnea long before I met her, and transferred her care here to be able to get supplies.  She has remained a very compliant CPAP user and is followed by [facility] for durable medical equipment.  She is 100% compliant over the last 30 days concluding with the night of the 15th-16th of July 2019.  Average use at time of 7 hours 48 minutes, CPAP is set at 11.6 cm water is 2 cm expiratory pressure relief.  The 95th percentile leak is 3.7 L which is excellent her residual AHI is only 1.4/h and there are no central apneas emerging.    Jodi Ray is intending to take her CPAP with her when she travels and she has not traveled in the last decade or so by airplane.  She learned that she will need a medical device card.  I have not This request before I think the medical device card is usually used for insulin pumps and spinal cord stimulators both implanted devices which she uses.  However I will ask [facility] if they can provide a medical device card so that she can check her CPAP as a on board carry him.  She also is very happy with the CPAP therapy and its effect on her daytime sleepiness, she endorsed the Epworth Sleepiness Scale at only 3 points, she does not have excessive daytime fatigue, her average sleep time is well over 7 hours.  She does not wake up at night, sleeps soundly through and has no nocturia.    Chief complaint according to patient : " I need new supplies, i don't need a sleep study".  Interval history from 01/22/2017. This is my first revisit with the patient since transfer of sleep care. The patient is doing very well with CPAP use, being 100% compliant with an average user time of 8 hours and 12 minutes, set pressure of 10.6 cm water with 27 m EPR and a residual AHI of 2.1. Of these obstructive apneas are 1.4 per hour. The patient's boyfriend has noted her to still snore and for this reason I will asked today to increase her pressure to 11.6 cm water. I will contact [facility]'s Linzie Collin to do the arrangements, making him aware that we treat snoring. Epworth Sleepiness Scale was endorsed at 4 points and the patient did not endorse fatigue  CONSULT NOTE: Jodi Ray, a right handed caucasian female patient of Dr. Trena Platt, is today referred for follow-up of her sleep apnea condition. She was diagnosed more than 13 years ago with obstructive sleep apnea and had been followed by Dr. Adriana Reams but he has left the [facility] , she has received a new CPAP machine just in December 2015 so she is not in need of any new equipment but she needs continued supplies. As she has been a Medicare patient since 2006 she will need a sleep doctor visit once a year to  allow her to obtain CPAP supplies. The patient moved here from [place] in 2015 and her original sleep study was performed in [place], she was told they are no records available as they were older than 10 years. Her risk factors are clear as she has hypertension associated with diabetes, morbid obesity, hypothyroidism and diabetes. She was also diagnosed with a polyneuropathy, she has been diagnosed with an intracerebral aneurysm and was a stabbing headache or ice pick headache. She is a nonsmoker and nondrinker and she drinks only one cup of caffeine at coffee per day. Her blood pressure has been well controlled on her current medical regimen,  is almost too low.  Jodi Ray is followed by a pain management practice. She has a  new general  neurologist, Metta Clines, DO , who follows her headaches.   I'm able to review Jodi Ray's last CPAP download, obtained on 03-09-15, just yesterday in preparation for this visit. It shows that she is 100% compliant for 30 out of 30 days and all these days she has used her CPAP more than 4 hours, 7 hours and 52 minutes on average. Her pressure is 10.6 cm water with a full-time EPR of 2 cm water. A residual AHI is 1.4. She does have high air leaks. It seems that she is still using an AutoSet a so-called AUTO set S 10 machine by [employer].  She is feeling comfortable with the current setting is sleeping well and does not wake up with headaches, dry mouth or from snoring. She will have one bathroom break at night. She usually sleeps from 10 PM plus minus one hour, to 6 AM when her dog wakes her up. She feels refreshed and restored in the mornings and feels that overall the morning time as her best time of the day to be productive and energized. She does not nap in daytime.  Interval history from 03/09/2016,This is a compliance visit. She does not drink does not use tobacco products or recreational drugs, she endorsed apnea ice pick headaches and speech difficulties. Her compliance download shows that she has used the machine 100% of all days over 4 hours with an average user time of 8 hours and 19 minutes,  pressure is 10.6 cm water, with  full-time EPR of 2 cm water residual AHI is excellent at only 1.3. There has been no major air leak. The needs to be no adjustments made to the machine. She also responds with an Epworth sleepiness score of only 3 points fatigue severity scale was not endorsed.  She is taking a sleep aid for almost 10 years, continued under Dr Dois Davenport care.     Sleep medical history and family sleep history: She is the only apnea patient in her family, no history of sleep walking. Social history: non smoker, non s drinker, 1 cup of coffee daily , disabled. [place]  [place] ,   Review of Systems: Out of a complete 14 system review, the patient complains of only the following symptoms, and all other reviewed systems are negative. The patient endorsed the Epworth Sleepiness Scale at 7 points and the fatigue severity scale at 13 points- well below average. Nasal allergic rhinitis, diabetes type 1 but better controlled. Weight gain, chronic back pain she has a spinal cord stimulator 2005 she has "Charcot "-feet and had a bone surgery to the left foot 2010. She had a fracture repaired with hardware in the right leg 2003.   Social History  Socioeconomic History  . Marital status: Widowed    Spouse name: Not on file  . Number of children: Not on file  . Years of education: Not on file  . Highest education level: Not on file  Occupational History  . Not on file  Social Needs  . Financial resource strain: Not on file  . Food insecurity:    Worry: Not on file    Inability: Not on file  . Transportation needs:    Medical: Not on file    Non-medical: Not on file  Tobacco Use  . Smoking status: Never Smoker  . Smokeless tobacco: Never Used  Substance and Sexual Activity  . Alcohol use: No    Alcohol/week: 0.0 oz  . Drug use: No  . Sexual activity: Yes    Partners: Male  Lifestyle  . Physical activity:    Days per week: Not on file    Minutes per session: Not on file  . Stress: Not on file  Relationships  . Social connections:    Talks on phone: Not on file    Gets together: Not on file    Attends religious service: Not on file    Active member of club or organization: Not on file    Attends meetings of clubs or organizations: Not on file    Relationship status: Not on file  . Intimate partner violence:    Fear of current or ex partner: Not on file    Emotionally abused: Not on file    Physically abused: Not on file    Forced sexual activity: Not on file  Other Topics Concern  . Not on file  Social History Narrative   Drinks about 1-2 cups  of caffeine daily.    Family History  Problem Relation Age of Onset  . Cancer Mother 63       colon  . Cancer Father        renal cell  . Cancer Brother        bladder  . Cancer Sister        primary brain, and primary breast too  . Cancer Sister        breast   . Cancer Other        multiple aunts with breast, cervical, or colon cancer    Past Medical History:  Diagnosis Date  . 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 Anemia   . Brain aneurysm    frontal lobe  . Diabetes mellitus without complication ([place])   . Headache   . HTN (hypertension)   . Hypothyroidism   . Neuromuscular disorder ([place])   . Neuropathy   . Obesity   . OSA on CPAP 03/10/2015  . Retinopathy   . Sleep apnea   . Varicose veins of both lower extremities     Past Surgical History:  Procedure Laterality Date  . BIOPSY  11/14/2016   Procedure: BIOPSY;  Surgeon: Danie Binder, MD;  Location: [facility];  Service: Endoscopy;;  colon gastric duodenal  . BREAST SURGERY Left 1994   Lumpectomy  . COLONOSCOPY WITH PROPOFOL N/A 11/14/2016   Dr. Oneida Alar: Moderately redundant rectosigmoid colon. Random colon biopsies benign. Internal hemorrhoids. Surveillance colonoscopy in 5 years.  . ESOPHAGOGASTRODUODENOSCOPY (EGD) WITH PROPOFOL N/A 11/14/2016   Dr. Oneida Alar: Esophagus normal. Moderate gastritis, few gastric polyps. Duodenal biopsies negative. Fundic gland polyp gastric polyp area no H pylori.  Marland Kitchen FOOT SURGERY    . LEG SURGERY Right   .  REPLACEMENT TOTAL KNEE Right   . SPINAL CORD STIMULATOR IMPLANT      Current Outpatient Medications  Medication Sig Dispense Refill  . aspirin EC 81 MG tablet Take 81 mg by mouth daily.    Marland Kitchen atorvastatin (LIPITOR) 20 MG tablet TAKE 1 TABLET BY MOUTH ONCE DAILY 90 tablet 0  . Cholecalciferol (VITAMIN D-3) 5000 units TABS Take 5,000 Units by mouth daily.     Marland Kitchen dicyclomine (BENTYL) 10 MG capsule Take 10 mg by mouth 4 (four) times daily -  before meals and at bedtime.    . Elastic Bandages &  Supports (WRIST SPLINT/COCK-UP/LEFT L) MISC 1 each by Does not apply route as directed. 1 each 0  . Elastic Bandages & Supports (WRIST SPLINT/COCK-UP/RIGHT L) MISC 1 each by Does not apply route as directed. 1 each 0  . Fluocinolone Acetonide (DERMOTIC) 0.01 % OIL Place 1 drop into both ears at bedtime.     . gabapentin (NEURONTIN) 400 MG capsule Take 400 mg by mouth daily.    Marland Kitchen gabapentin (NEURONTIN) 800 MG tablet Take 800 mg by mouth daily.     . hydrochlorothiazide (MICROZIDE) 12.5 MG capsule Take 12.5 mg by mouth daily.    Marland Kitchen Hyoscyamine Sulfate SL (LEVSIN/SL) 0.125 MG SUBL Place 1 tablet under the tongue 3 (three) times daily as needed. abd pain 90 each 1  . Insulin Human (INSULIN PUMP) SOLN Inject into the skin continuous. Novolog    . levothyroxine (SYNTHROID, LEVOTHROID) 125 MCG tablet Take 125 mcg by mouth daily before breakfast.   0  . lisinopril (PRINIVIL,ZESTRIL) 10 MG tablet take 1 tablet by mouth once daily 30 tablet 2  . loratadine (CLARITIN) 10 MG tablet Take 10 mg by mouth daily.    . metFORMIN (GLUCOPHAGE) 500 MG tablet Take 500 mg by mouth 2 (two) times daily with a meal.    . Multiple Vitamins-Minerals (CENTRUM SILVER 50+WOMEN) TABS Take 1 tablet by mouth daily.     . Omega-3 Fatty Acids (FISH OIL) 1000 MG CAPS Take 1,000 mg by mouth daily.    Marland Kitchen omeprazole (PRILOSEC) 40 MG capsule Take 40 mg by mouth 2 (two) times daily.     Marland Kitchen topiramate (TOPAMAX) 50 MG tablet TAKE 1 TABLET BY MOUTH ONCE DAILY 90 tablet 0   No current facility-administered medications for this visit.     Allergies as of 02/14/2018 - Review Complete 02/14/2018  Allergen Reaction Noted  . Fentanyl Other (See Comments) 11/09/2016    Vitals: BP (!) 162/78   Pulse 82   Ht '5\' 9"'  (1.753 m)   Wt 255 lb (115.7 kg)   BMI 37.66 kg/m  Last Weight:  Wt Readings from Last 1 Encounters:  02/14/18 255 lb (115.7 kg)   ZYY:QMGN mass index is 37.66 kg/m.     Last Height:   Ht Readings from Last 1 Encounters:    02/14/18 '5\' 9"'  (1.753 m)    Physical exam:  General: The patient is awake, alert and appears not in acute distress. The patient is well groomed. Head: Normocephalic, atraumatic. Neck is supple. Mallampati 4 , poor dental status.  neck circumference:15. Nasal airflow unrestricted , TMJ is not evident . Retrognathia is seen.  Cardiovascular:  Regular rate and rhythm, without  murmurs or carotid bruit, and without distended neck veins. Respiratory: Lungs are clear to auscultation. Skin:  Without evidence of edema, or rash Trunk: BMI is elevated . The patient's posture is hunched    Neurologic exam : The patient  is awake and alert, oriented to place and time.   Memory subjective described as intact.  Memory testing revealed no defiicts. Attention span & concentration ability appears normal.  Speech is fluent,  without dysarthria, dysphonia or aphasia.  Cranial nerves:  Taste and smell intact . Pupils are equal and briskly reactive to light. Visual fields by finger perimetry are intact. Hearing to finger rub intact. Facial motor strength is symmetric and tongue and uvula move midline. Shoulder shrug was symmetrical.  Motor exam:  Normal tone, muscle bulk and symmetric strength in all extremities. Sensory:  Fine touch, pinprick and vibration were tested in all extremities. No tremor. Gait and station: Patient walks with a cane for  assistive device and is able unassisted to climb up to the exam table.  Strength within normal limits. Stance is stable and normal. Uses a cane .Tandem gait is deferred - she Turns with 4 Steps. Romberg testing is negative. Deep tendon reflexes: in the  upper and lower extremities are symmetric and intact. Babinski maneuver response is downgoing.  The patient was advised of the nature of the diagnosed sleep disorder , the treatment options and risks for general a health and wellness arising from not treating the condition.  I spent more than 25 minutes of face to  face time with the patient. Greater than 50% of time was spent in counseling and coordination of care. We have discussed the diagnosis and differential and I answered the patient's questions.     Assessment:  After physical and neurologic examination, review of laboratory studies,  Personal review of imaging studies, reports of other /same  Imaging studies ,  Results of polysomnography/ neurophysiology testing and pre-existing records as far as provided in visit., my assessment is:   1) patient with a diagnosis of obstructive sleep apnea also her original test results are not available. She will has carried this diagnosis for over 13 years she has been treated for CPAP on an auto titrating machine  and received a new machine just in December 2015, prescribed by Dr. Redmond Pulling.   Based on her downloads,  she is 100% compliant, uses a set pressure of 10.6 cm water with 2 cm EPR and has a remarkably low residual AHI of only 1.4 per hour. We will continue to prescribe the supplies she needs. Her main risk factor is obesity- and muscle relaxant therapy .  She was told she needs a medical device card to fly with her carry on CPAP.   DME is [facility]-  Linzie Collin, RRT-PSG.  I will contact [facility]'s Linzie Collin to do the arrangements,     Sincerely,  Larey Seat MD  02/14/2018   CC: Monico Blitz, [place], [place] [address]

## 2018-03-04 DIAGNOSIS — E1140 Type 2 diabetes mellitus with diabetic neuropathy, unspecified: Secondary | ICD-10-CM | POA: Diagnosis not present

## 2018-03-04 DIAGNOSIS — E1151 Type 2 diabetes mellitus with diabetic peripheral angiopathy without gangrene: Secondary | ICD-10-CM | POA: Diagnosis not present

## 2018-03-05 DIAGNOSIS — I10 Essential (primary) hypertension: Secondary | ICD-10-CM | POA: Diagnosis not present

## 2018-03-05 DIAGNOSIS — M159 Polyosteoarthritis, unspecified: Secondary | ICD-10-CM | POA: Diagnosis not present

## 2018-03-05 DIAGNOSIS — E119 Type 2 diabetes mellitus without complications: Secondary | ICD-10-CM | POA: Diagnosis not present

## 2018-03-12 DIAGNOSIS — E113512 Type 2 diabetes mellitus with proliferative diabetic retinopathy with macular edema, left eye: Secondary | ICD-10-CM | POA: Diagnosis not present

## 2018-03-20 ENCOUNTER — Inpatient Hospital Stay (HOSPITAL_COMMUNITY)
Admission: EM | Admit: 2018-03-20 | Discharge: 2018-03-23 | DRG: 247 | Disposition: A | Discharge status: Home / Self Care | Payer: Medicare Other | Attending: Internal Medicine | Admitting: Internal Medicine

## 2018-03-20 ENCOUNTER — Telehealth: Payer: Self-pay | Admitting: Cardiovascular Disease

## 2018-03-20 ENCOUNTER — Emergency Department (HOSPITAL_COMMUNITY): Payer: Medicare Other

## 2018-03-20 ENCOUNTER — Other Ambulatory Visit: Payer: Self-pay

## 2018-03-20 ENCOUNTER — Encounter (HOSPITAL_COMMUNITY): Payer: Self-pay | Admitting: Emergency Medicine

## 2018-03-20 DIAGNOSIS — E10319 Type 1 diabetes mellitus with unspecified diabetic retinopathy without macular edema: Secondary | ICD-10-CM | POA: Diagnosis present

## 2018-03-20 DIAGNOSIS — I447 Left bundle-branch block, unspecified: Secondary | ICD-10-CM | POA: Diagnosis not present

## 2018-03-20 DIAGNOSIS — Z955 Presence of coronary angioplasty implant and graft: Secondary | ICD-10-CM

## 2018-03-20 DIAGNOSIS — Z8052 Family history of malignant neoplasm of bladder: Secondary | ICD-10-CM

## 2018-03-20 DIAGNOSIS — Z6835 Body mass index (BMI) 35.0-35.9, adult: Secondary | ICD-10-CM

## 2018-03-20 DIAGNOSIS — D649 Anemia, unspecified: Secondary | ICD-10-CM | POA: Diagnosis present

## 2018-03-20 DIAGNOSIS — I10 Essential (primary) hypertension: Secondary | ICD-10-CM | POA: Diagnosis not present

## 2018-03-20 DIAGNOSIS — Z800 Family history of malignant neoplasm of digestive organs: Secondary | ICD-10-CM

## 2018-03-20 DIAGNOSIS — G4733 Obstructive sleep apnea (adult) (pediatric): Secondary | ICD-10-CM | POA: Diagnosis present

## 2018-03-20 DIAGNOSIS — IMO0002 Reserved for concepts with insufficient information to code with codable children: Secondary | ICD-10-CM | POA: Diagnosis present

## 2018-03-20 DIAGNOSIS — R61 Generalized hyperhidrosis: Secondary | ICD-10-CM | POA: Diagnosis not present

## 2018-03-20 DIAGNOSIS — E1065 Type 1 diabetes mellitus with hyperglycemia: Secondary | ICD-10-CM | POA: Diagnosis present

## 2018-03-20 DIAGNOSIS — Z888 Allergy status to other drugs, medicaments and biological substances status: Secondary | ICD-10-CM

## 2018-03-20 DIAGNOSIS — Z79890 Hormone replacement therapy (postmenopausal): Secondary | ICD-10-CM

## 2018-03-20 DIAGNOSIS — R0789 Other chest pain: Secondary | ICD-10-CM | POA: Diagnosis present

## 2018-03-20 DIAGNOSIS — R079 Chest pain, unspecified: Secondary | ICD-10-CM

## 2018-03-20 DIAGNOSIS — Z7982 Long term (current) use of aspirin: Secondary | ICD-10-CM

## 2018-03-20 DIAGNOSIS — Z23 Encounter for immunization: Secondary | ICD-10-CM

## 2018-03-20 DIAGNOSIS — E039 Hypothyroidism, unspecified: Secondary | ICD-10-CM | POA: Diagnosis not present

## 2018-03-20 DIAGNOSIS — I25110 Atherosclerotic heart disease of native coronary artery with unstable angina pectoris: Principal | ICD-10-CM | POA: Diagnosis present

## 2018-03-20 DIAGNOSIS — R0602 Shortness of breath: Secondary | ICD-10-CM | POA: Diagnosis not present

## 2018-03-20 DIAGNOSIS — Z9641 Presence of insulin pump (external) (internal): Secondary | ICD-10-CM | POA: Diagnosis present

## 2018-03-20 DIAGNOSIS — G43909 Migraine, unspecified, not intractable, without status migrainosus: Secondary | ICD-10-CM | POA: Diagnosis present

## 2018-03-20 DIAGNOSIS — G894 Chronic pain syndrome: Secondary | ICD-10-CM | POA: Diagnosis present

## 2018-03-20 DIAGNOSIS — Z794 Long term (current) use of insulin: Secondary | ICD-10-CM

## 2018-03-20 DIAGNOSIS — I200 Unstable angina: Secondary | ICD-10-CM | POA: Diagnosis present

## 2018-03-20 DIAGNOSIS — E782 Mixed hyperlipidemia: Secondary | ICD-10-CM | POA: Diagnosis present

## 2018-03-20 DIAGNOSIS — Z803 Family history of malignant neoplasm of breast: Secondary | ICD-10-CM

## 2018-03-20 HISTORY — DX: Personal history of other diseases of the circulatory system: Z86.79

## 2018-03-20 HISTORY — DX: Personal history of other medical treatment: Z92.89

## 2018-03-20 HISTORY — DX: Other chronic pain: G89.29

## 2018-03-20 LAB — BASIC METABOLIC PANEL
Anion gap: 8 (ref 5–15)
BUN: 28 mg/dL — AB (ref 6–20)
CHLORIDE: 109 mmol/L (ref 98–111)
CO2: 24 mmol/L (ref 22–32)
CREATININE: 1.04 mg/dL — AB (ref 0.44–1.00)
Calcium: 9.3 mg/dL (ref 8.9–10.3)
GFR calc Af Amer: >60 mL/min (ref >60)
GFR calc non Af Amer: 58 mL/min — ABNORMAL LOW (ref >60)
GLUCOSE: 162 mg/dL — AB (ref 70–99)
Potassium: 4.5 mmol/L (ref 3.5–5.1)
Sodium: 141 mmol/L (ref 135–145)

## 2018-03-20 LAB — TROPONIN I
Troponin I: <0.03 ng/mL (ref <0.03)
Troponin I: <0.03 ng/mL (ref <0.03)

## 2018-03-20 LAB — CBC WITH DIFFERENTIAL/PLATELET
Basophils Absolute: 0 10*3/uL (ref 0.0–0.1)
Basophils Relative: 1 %
EOS PCT: 2 %
Eosinophils Absolute: 0.1 10*3/uL (ref 0.0–0.7)
HCT: 35 % — ABNORMAL LOW (ref 36.0–46.0)
Hemoglobin: 11.3 g/dL — ABNORMAL LOW (ref 12.0–15.0)
LYMPHS ABS: 1.6 10*3/uL (ref 0.7–4.0)
LYMPHS PCT: 31 %
MCH: 29.2 pg (ref 26.0–34.0)
MCHC: 32.3 g/dL (ref 30.0–36.0)
MCV: 90.4 fL (ref 78.0–100.0)
MONO ABS: 0.4 10*3/uL (ref 0.1–1.0)
Monocytes Relative: 7 %
Neutro Abs: 3 10*3/uL (ref 1.7–7.7)
Neutrophils Relative %: 59 %
PLATELETS: 206 10*3/uL (ref 150–400)
RBC: 3.87 MIL/uL (ref 3.87–5.11)
RDW: 14.2 % (ref 11.5–15.5)
WBC: 5.1 10*3/uL (ref 4.0–10.5)

## 2018-03-20 MED ORDER — GENTEAL TEARS NIGHT-TIME OP OINT: 1.0000 [drp] | TOPICAL_OINTMENT | Freq: Every day | OPHTHALMIC | Status: DC | PRN

## 2018-03-20 MED ORDER — ARTIFICIAL TEARS OPHTHALMIC OINT
TOPICAL_OINTMENT | Freq: Every day | OPHTHALMIC | Status: DC | PRN
  Filled 2018-03-20: qty 3.5

## 2018-03-20 MED ORDER — ADULT MULTIVITAMIN W/MINERALS CH
1.0000 | ORAL_TABLET | Freq: Every day | ORAL | Status: DC
  Administered 2018-03-21 – 2018-03-22 (×2): 1 via ORAL
  Filled 2018-03-20 (×4): qty 1

## 2018-03-20 MED ORDER — ATORVASTATIN CALCIUM 20 MG PO TABS
20.0000 mg | ORAL_TABLET | Freq: Every day | ORAL | Status: DC
  Administered 2018-03-21 – 2018-03-22 (×2): 20 mg via ORAL
  Filled 2018-03-20 (×2): qty 2
  Filled 2018-03-20: qty 1

## 2018-03-20 MED ORDER — POLYVINYL ALCOHOL 1.4 % OP SOLN
1.0000 [drp] | Freq: Every day | OPHTHALMIC | Status: DC | PRN
  Filled 2018-03-20: qty 15

## 2018-03-20 MED ORDER — ACETAMINOPHEN 325 MG PO TABS: 650.0000 mg | ORAL_TABLET | ORAL | Status: DC | PRN

## 2018-03-20 MED ORDER — METFORMIN HCL 500 MG PO TABS
500.0000 mg | ORAL_TABLET | Freq: Two times a day (BID) | ORAL | Status: DC
  Administered 2018-03-21 (×2): 500 mg via ORAL
  Filled 2018-03-20 (×2): qty 1

## 2018-03-20 MED ORDER — LEVOTHYROXINE SODIUM 50 MCG PO TABS
125.0000 ug | ORAL_TABLET | Freq: Every day | ORAL | Status: DC
  Administered 2018-03-21 – 2018-03-23 (×3): 125 ug via ORAL
  Filled 2018-03-20 (×3): qty 3

## 2018-03-20 MED ORDER — GABAPENTIN 400 MG PO CAPS
400.0000 mg | ORAL_CAPSULE | Freq: Every day | ORAL | Status: DC
  Administered 2018-03-21 – 2018-03-23 (×3): 400 mg via ORAL
  Filled 2018-03-20 (×3): qty 1

## 2018-03-20 MED ORDER — FLUOCINOLONE ACETONIDE 0.01 % OT OIL: 1.0000 [drp] | TOPICAL_OIL | OTIC | Status: DC

## 2018-03-20 MED ORDER — ONDANSETRON HCL 4 MG/2ML IJ SOLN: 4.0000 mg | Freq: Four times a day (QID) | INTRAMUSCULAR | Status: DC | PRN

## 2018-03-20 MED ORDER — LISINOPRIL 10 MG PO TABS
10.0000 mg | ORAL_TABLET | Freq: Every day | ORAL | Status: DC
  Administered 2018-03-21 – 2018-03-23 (×3): 10 mg via ORAL
  Filled 2018-03-20 (×3): qty 1

## 2018-03-20 MED ORDER — ASPIRIN 81 MG PO CHEW
243.0000 mg | CHEWABLE_TABLET | Freq: Once | ORAL | Status: AC
  Administered 2018-03-20: 243 mg via ORAL
  Filled 2018-03-20: qty 3

## 2018-03-20 MED ORDER — HYDROCHLOROTHIAZIDE 12.5 MG PO CAPS
12.5000 mg | ORAL_CAPSULE | Freq: Every day | ORAL | Status: DC
  Administered 2018-03-21 – 2018-03-23 (×3): 12.5 mg via ORAL
  Filled 2018-03-20 (×3): qty 1

## 2018-03-20 MED ORDER — VITAMIN D 1000 UNITS PO TABS: 5000.0000 [IU] | ORAL_TABLET | ORAL | Status: DC

## 2018-03-20 MED ORDER — GABAPENTIN 400 MG PO CAPS: 400.0000 mg | ORAL_CAPSULE | ORAL | Status: DC

## 2018-03-20 MED ORDER — GABAPENTIN 400 MG PO CAPS
800.0000 mg | ORAL_CAPSULE | Freq: Every day | ORAL | Status: DC
  Administered 2018-03-20 – 2018-03-22 (×3): 800 mg via ORAL
  Filled 2018-03-20 (×3): qty 2

## 2018-03-20 MED ORDER — DICYCLOMINE HCL 10 MG PO CAPS
10.0000 mg | ORAL_CAPSULE | Freq: Three times a day (TID) | ORAL | Status: DC
  Administered 2018-03-20 – 2018-03-23 (×8): 10 mg via ORAL
  Filled 2018-03-20 (×10): qty 1

## 2018-03-20 MED ORDER — ASPIRIN EC 81 MG PO TBEC: 81.0000 mg | DELAYED_RELEASE_TABLET | Freq: Every day | ORAL | Status: DC

## 2018-03-20 MED ORDER — INSULIN PUMP
Freq: Three times a day (TID) | SUBCUTANEOUS | Status: DC
  Administered 2018-03-20: via SUBCUTANEOUS
  Filled 2018-03-20: qty 1

## 2018-03-20 MED ORDER — ASPIRIN EC 325 MG PO TBEC
325.0000 mg | DELAYED_RELEASE_TABLET | Freq: Every day | ORAL | Status: DC
  Administered 2018-03-21: 325 mg via ORAL
  Filled 2018-03-20 (×2): qty 1

## 2018-03-20 MED ORDER — LORATADINE 10 MG PO TABS
10.0000 mg | ORAL_TABLET | Freq: Every day | ORAL | Status: DC
  Administered 2018-03-21 – 2018-03-23 (×3): 10 mg via ORAL
  Filled 2018-03-20 (×3): qty 1

## 2018-03-20 MED ORDER — INSULIN ASPART 100 UNIT/ML ~~LOC~~ SOLN: 1.0000 [IU] | SUBCUTANEOUS | Status: DC

## 2018-03-20 MED ORDER — PNEUMOCOCCAL VAC POLYVALENT 25 MCG/0.5ML IJ INJ
0.5000 mL | INJECTION | INTRAMUSCULAR | Status: AC
  Administered 2018-03-21: 0.5 mL via INTRAMUSCULAR
  Filled 2018-03-20: qty 0.5

## 2018-03-20 MED ORDER — TOPIRAMATE 25 MG PO TABS
50.0000 mg | ORAL_TABLET | Freq: Every day | ORAL | Status: DC
  Administered 2018-03-20 – 2018-03-22 (×3): 50 mg via ORAL
  Filled 2018-03-20 (×3): qty 2

## 2018-03-20 NOTE — Telephone Encounter (Signed)
Pt informed and voiced understanding

## 2018-03-20 NOTE — Telephone Encounter (Signed)
Pt states that an hour ago she started to have chest tightness, SOB, Chest Pain, Lt arm tingling. She states this lasted about 45 sec. When asked how she felt now. Pt reports that she is tired. Pt denies SOB and chest pain at this time. Does report tightness in the chest when walking. Inform pt ER visit was needed. Pt stated " What can they do now! " . Please advise.

## 2018-03-20 NOTE — ED Triage Notes (Signed)
Pt reports chest pain around 11am. Diaphoresis, sob with chest pain intermittently. Pain sometimes going down left arm.Pt is speaking in complete sentences with no obvious signs of acute distress.

## 2018-03-20 NOTE — Telephone Encounter (Signed)
Pt left message stating she's having some problems and she thinks she may have had a slight heart attack, she did not give any symptoms.   Please give her a call @ [phone]

## 2018-03-20 NOTE — Telephone Encounter (Signed)
She needs to go to the ED

## 2018-03-20 NOTE — H&P (Signed)
History and Physical    Jodi Ray TTS:[id number] DOB: [birth year] DOA: 03/20/2018  PCP: Monico Blitz, MD  Patient coming from: Home  Chief Complaint: Chest pain  HPI: Jodi Ray is a 57 y.o. female with medical history significant of insulin-dependent diabetic, hypertension comes in with substernal chest pain that radiated down her left arm earlier this afternoon that occurred during exertion that lasted about 2 minutes and resolved with rest.  Patient denies any cough or fevers.  She denies any lower extremity edema or swelling.  She has not had any recurrence of the pain since.  She associated some nausea and shortness of breath when this occurred.  But again total time was well less than 5 minutes and resolved with rest.  Patient did have a cardiac stress test done in 2018 which did show moderate size defect at that time and was done for left bundle branch block on EKG.  No further work-up was recommended at that time until she became symptomatic.  Patient is being referred for admission for her chest pain and for further evaluation at this time.  Review of Systems: As per HPI otherwise 10 point review of systems negative.   Past Medical History:  Diagnosis Date  . Adult RDS ([place])   . Anemia   . Brain aneurysm    frontal lobe  . Chronic pain   . Diabetes mellitus without complication ([place])   . Headache   . History of left bundle branch block (LBBB)   . HTN (hypertension)   . Hx of cardiovascular stress test 10/2016   intermediate risk study  . Hypothyroidism   . Neuromuscular disorder ([place])   . Neuropathy   . Obesity   . OSA on CPAP 03/10/2015  . Retinopathy   . Sleep apnea   . Varicose veins of both lower extremities     Past Surgical History:  Procedure Laterality Date  . BIOPSY  11/14/2016   Procedure: BIOPSY;  Surgeon: Danie Binder, MD;  [facility];  Service: Endoscopy;;  colon gastric duodenal  . BREAST SURGERY Left 1994   Lumpectomy  .  COLONOSCOPY WITH PROPOFOL N/A 11/14/2016   Dr. Oneida Alar: Moderately redundant rectosigmoid colon. Random colon biopsies benign. Internal hemorrhoids. Surveillance colonoscopy in 5 years.  . ESOPHAGOGASTRODUODENOSCOPY (EGD) WITH PROPOFOL N/A 11/14/2016   Dr. Oneida Alar: Esophagus normal. Moderate gastritis, few gastric polyps. Duodenal biopsies negative. Fundic gland polyp gastric polyp area no H pylori.  Marland Kitchen FOOT SURGERY    . LEG SURGERY Right   . REPLACEMENT TOTAL KNEE Right   . SPINAL CORD STIMULATOR IMPLANT       reports that she has never smoked. She has never used smokeless tobacco. She reports that she does not drink alcohol or use drugs.  Allergies  Allergen Reactions  . Fentanyl Nausea And Vomiting and Other (See Comments)    Nausea, vomiting, loss of consciousness, requiring reversal    Family History  Problem Relation Age of Onset  . Cancer Mother 52       colon  . Cancer Father        renal cell  . Cancer Brother        bladder  . Cancer Sister        primary brain, and primary breast too  . Cancer Sister        breast   . Cancer Other        multiple aunts with breast, cervical, or colon cancer    Prior  to Admission medications   Medication Sig Start Date End Date Taking? Authorizing Provider  aspirin EC 81 MG tablet Take 81 mg by mouth daily.   Yes [provider]  atorvastatin (LIPITOR) 20 MG tablet TAKE 1 TABLET BY MOUTH ONCE DAILY 01/08/18  Yes Herminio Commons, MD  Bevacizumab (AVASTIN IV) Place into the left eye once.   Yes [provider]  Cholecalciferol (VITAMIN D-3) 5000 units TABS Take 5,000 Units by mouth every 3 (three) days.    Yes [provider]  dicyclomine (BENTYL) 10 MG capsule Take 10 mg by mouth 4 (four) times daily -  before meals and at bedtime.   Yes [provider]  Fluocinolone Acetonide (DERMOTIC) 0.01 % OIL Place 1 drop into both ears 3 (three) times a week.    Yes [provider]  gabapentin  (NEURONTIN) 400 MG capsule Take 400-800 mg by mouth See admin instructions. 400mg  in the morning and 800mg  at bedtime   Yes [provider]  hydrochlorothiazide (MICROZIDE) 12.5 MG capsule Take 12.5 mg by mouth daily.   Yes [provider]  Insulin Human (INSULIN PUMP) SOLN Inject into the skin continuous. Novolog (up to 60 units as of 02/24/2018)   Yes [provider]  levothyroxine (SYNTHROID, LEVOTHROID) 125 MCG tablet Take 125 mcg by mouth daily before breakfast.  11/01/16  Yes [provider]  lisinopril (PRINIVIL,ZESTRIL) 10 MG tablet take 1 tablet by mouth once daily Patient taking differently: Take 10 mg by mouth daily.  07/28/15  Yes Cassandria Anger, MD  loratadine (CLARITIN) 10 MG tablet Take 10 mg by mouth 2 (two) times daily.    Yes [provider]  metFORMIN (GLUCOPHAGE) 500 MG tablet Take 500 mg by mouth 2 (two) times daily with a meal.   Yes [provider]  Multiple Vitamins-Minerals (CENTRUM SILVER 50+WOMEN) TABS Take 1 tablet by mouth daily.    Yes [provider]  NOVOLOG 100 UNIT/ML injection Inject into the skin continuous. Via insulin pump as directed up to 60 units (CGM) 02/24/18  Yes [provider]  Omega-3 Fatty Acids (FISH OIL) 1000 MG CAPS Take 1,000 mg by mouth daily.   Yes [provider]  omeprazole (PRILOSEC) 40 MG capsule Take 40 mg by mouth 2 (two) times daily.    Yes [provider]  Polyethylene Glycol 400 (BLINK TEARS) 0.25 % SOLN Apply 1 drop to eye daily as needed (for eye irritation/dryness).   Yes [provider]  topiramate (TOPAMAX) 50 MG tablet TAKE 1 TABLET BY MOUTH ONCE DAILY Patient taking differently: Take 50 mg by mouth every evening.  01/04/18  Yes Jaffe, Adam R, DO  White Petrolatum-Mineral Oil (GENTEAL TEARS NIGHT-TIME) OINT Apply 1 drop to eye daily as needed (for dryness/irritation).   Yes [provider]    Physical Exam: Vitals:    03/20/18 1630 03/20/18 1700 03/20/18 1830 03/20/18 1852  BP: (!) 110/49 (!) 123/93  117/70  Pulse: 86 86 78 84  Resp:    18  Temp:      TempSrc:      SpO2: 96% 100% 100% 98%  Weight:      Height:          Constitutional: NAD, calm, comfortable Vitals:   03/20/18 1630 03/20/18 1700 03/20/18 1830 03/20/18 1852  BP: (!) 110/49 (!) 123/93  117/70  Pulse: 86 86 78 84  Resp:    18  Temp:      TempSrc:  SpO2: 96% 100% 100% 98%  Weight:      Height:       Eyes: PERRL, lids and conjunctivae normal ENMT: Mucous membranes are moist. Posterior pharynx clear of any exudate or lesions.Normal dentition.  Neck: normal, supple, no masses, no thyromegaly Respiratory: clear to auscultation bilaterally, no wheezing, no crackles. Normal respiratory effort. No accessory muscle use.  Cardiovascular: Regular rate and rhythm, no murmurs / rubs / gallops. No extremity edema. 2+ pedal pulses. No carotid bruits.  Abdomen: no tenderness, no masses palpated. No hepatosplenomegaly. Bowel sounds positive.  Musculoskeletal: no clubbing / cyanosis. No joint deformity upper and lower extremities. Good ROM, no contractures. Normal muscle tone.  Skin: no rashes, lesions, ulcers. No induration Neurologic: CN 2-12 grossly intact. Sensation intact, DTR normal. Strength 5/5 in all 4.  Psychiatric: Normal judgment and insight. Alert and oriented x 3. Normal mood.    Labs on Admission: I have personally reviewed following labs and imaging studies  CBC: Recent Labs  Lab 03/20/18 1613  WBC 5.1  NEUTROABS 3.0  HGB 11.3*  HCT 35.0*  MCV 90.4  PLT 035   Basic Metabolic Panel: Recent Labs  Lab 03/20/18 1613  NA 141  K 4.5  CL 109  CO2 24  GLUCOSE 162*  BUN 28*  CREATININE 1.04*  CALCIUM 9.3   GFR: Estimated Creatinine Clearance: 78.1 mL/min (A) (by C-G formula based on SCr of 1.04 mg/dL (H)). Liver Function Tests: No results for input(s): AST, ALT, ALKPHOS, BILITOT, PROT, ALBUMIN in the last 168  hours. No results for input(s): LIPASE, AMYLASE in the last 168 hours. No results for input(s): AMMONIA in the last 168 hours. Coagulation Profile: No results for input(s): INR, PROTIME in the last 168 hours. Cardiac Enzymes: Recent Labs  Lab 03/20/18 1613  TROPONINI <0.03   BNP (last 3 results) No results for input(s): PROBNP in the last 8760 hours. HbA1C: No results for input(s): HGBA1C in the last 72 hours. CBG: No results for input(s): GLUCAP in the last 168 hours. Lipid Profile: No results for input(s): CHOL, HDL, LDLCALC, TRIG, CHOLHDL, LDLDIRECT in the last 72 hours. Thyroid Function Tests: No results for input(s): TSH, T4TOTAL, FREET4, T3FREE, THYROIDAB in the last 72 hours. Anemia Panel: No results for input(s): VITAMINB12, FOLATE, FERRITIN, TIBC, IRON, RETICCTPCT in the last 72 hours. Urine analysis: No results found for: COLORURINE, APPEARANCEUR, LABSPEC, PHURINE, GLUCOSEU, HGBUR, BILIRUBINUR, KETONESUR, PROTEINUR, UROBILINOGEN, NITRITE, LEUKOCYTESUR Sepsis Labs: !!!!!!!!!!!!!!!!!!!!!!!!!!!!!!!!!!!!!!!!!!!! @LABRCNTIP (procalcitonin:4,lacticidven:4) )No results found for this or any previous visit (from the past 240 hour(s)).   Radiological Exams on Admission: Dg Chest 2 View  Result Date: 03/20/2018 CLINICAL DATA:  Chest pain extending to LEFT shoulder, shortness of breath, diaphoresis EXAM: CHEST - 2 VIEW COMPARISON:  07/21/2017 FINDINGS: Normal heart size, mediastinal contours, and pulmonary vascularity. Mild chronic bronchitic changes. No acute infiltrate, pleural effusion or pneumothorax. Intraspinal stimulator noted. No acute osseous findings. IMPRESSION: Mild chronic bronchitic changes without infiltrate. Electronically Signed   By: Lavonia Dana M.D.   On: 03/20/2018 17:45    EKG: Independently reviewed.  Normal sinus rhythm with a left bundle branch block which is old compared to old EKGs  Chest x-ray reviewed no edema or infiltrate  Discussed with Dr. Thurnell Garbe  in the ED  Old chart reviewed  Assessment/Plan 57 year old female with atypical chest pain comes in with previously abnormal stress test Principal Problem:   Chest pain-atypical and brief episode.  EKG without any changes and troponin initially negative.  Serial cardiac enzymes.  Cardiology is consulted and will see patient in the morning.  We will keep n.p.o. after midnight in case there is need for further work-up with heart catheterization.  Aspirin.  Currently chest pain-free.  Active Problems:   Severe obesity (BMI >= 40) (HCC)-noted and stable    Type I diabetes mellitus, uncontrolled (HCC)-patient can continue to use her own insulin pump at this time    Benign hypertension-continue home meds     DVT prophylaxis: SCDs Code Status: Full Family Communication: None Disposition Plan: Likely tomorrow Consults called: Cardiology Admission status: Observation   Jodi Ray A MD [facility]  If 7PM-7AM, please contact night-coverage [email or web address] Password [facility]  03/20/2018, 6:58 PM

## 2018-03-20 NOTE — ED Provider Notes (Signed)
[facility] EMERGENCY DEPARTMENT Provider Note   CSN: [id number] Arrival date & time: 03/20/18  1439     History   Chief Complaint Chief Complaint  Patient presents with  . Chest Pain    HPI Jodi Ray is a 57 y.o. female.  HPI  Pt was seen at 1535.  Per pt, c/o gradual onset and resolution of multiple intermittent episodes of mid-sternal chest "pain" that began this morning approximately 1100. Pt describes the CP as "tightness."  Pt states she was walking in her home, sat down, and began to develop CP. Has been associated with SOB, diaphoresis, and radiation into her left arm. Pt states her symptoms lasted approximately 15 minutes before resolving. States she felt "very tired" after her symptoms resolved. Pt states she walked outside to "get some air" and noticed her symptoms returned when she ambulated. Pt states she sat and rested with slow improvement of her symptoms. Pt states she then called her Cards MD and was told to come to the ED for further evaluation. Pt states while walking from her car to the hospital she developed symptoms once again. Her symptoms have improved since sitting and resting. Pt has already taken ASA 81mg  today. Denies palpitations, no cough, no back pain, no abd pain, no N/V/D, no fevers, no injury, no rash.     Past Medical History:  Diagnosis Date  . Adult RDS ([place])   . Anemia   . Brain aneurysm    frontal lobe  . Chronic pain   . Diabetes mellitus without complication ([place])   . Headache   . History of left bundle branch block (LBBB)   . HTN (hypertension)   . Hx of cardiovascular stress test 10/2016   intermediate risk study  . Hypothyroidism   . Neuromuscular disorder ([place])   . Neuropathy   . Obesity   . OSA on CPAP 03/10/2015  . Retinopathy   . Sleep apnea   . Varicose veins of both lower extremities     Patient Active Problem List   Diagnosis Date Noted  . Gastritis and gastroduodenitis 03/05/2017  . Suprapubic pain 03/05/2017    . Right sided abdominal pain 10/24/2016  . Diarrhea 10/24/2016  . Family hx of colon cancer 10/24/2016  . Varicose veins of bilateral lower extremities with other complications 93/23/5573  . 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 Type I diabetes mellitus, uncontrolled ([place]) 04/30/2015  . Mixed hyperlipidemia 04/30/2015  . Primary hypothyroidism 04/30/2015  . Benign hypertension 04/30/2015  . OSA on CPAP 03/10/2015  . Severe obesity (BMI >= 40) ([place]) 03/10/2015  . Migraine without aura and without status migrainosus, not intractable 01/08/2015  . Primary stabbing headache 10/02/2014    Past Surgical History:  Procedure Laterality Date  . BIOPSY  11/14/2016   Procedure: BIOPSY;  Surgeon: Danie Binder, MD;  [facility];  Service: Endoscopy;;  colon gastric duodenal  . BREAST SURGERY Left 1994   Lumpectomy  . COLONOSCOPY WITH PROPOFOL N/A 11/14/2016   Dr. Oneida Alar: Moderately redundant rectosigmoid colon. Random colon biopsies benign. Internal hemorrhoids. Surveillance colonoscopy in 5 years.  . ESOPHAGOGASTRODUODENOSCOPY (EGD) WITH PROPOFOL N/A 11/14/2016   Dr. Oneida Alar: Esophagus normal. Moderate gastritis, few gastric polyps. Duodenal biopsies negative. Fundic gland polyp gastric polyp area no H pylori.  Marland Kitchen FOOT SURGERY    . LEG SURGERY Right   . REPLACEMENT TOTAL KNEE Right   . SPINAL CORD STIMULATOR IMPLANT       OB History  None      Home Medications    Prior to Admission medications   Medication Sig Start Date End Date Taking? Authorizing Provider  aspirin EC 81 MG tablet Take 81 mg by mouth daily.    [provider]  atorvastatin (LIPITOR) 20 MG tablet TAKE 1 TABLET BY MOUTH ONCE DAILY 01/08/18   Herminio Commons, MD  Cholecalciferol (VITAMIN D-3) 5000 units TABS Take 5,000 Units by mouth daily.     [provider]  dicyclomine (BENTYL) 10 MG capsule Take 10 mg by mouth 4 (four) times daily -  before meals and at bedtime.    [provider]  Elastic Bandages & Supports (WRIST SPLINT/COCK-UP/LEFT L) MISC 1 each by Does not apply route as directed. 01/12/16   Pieter Partridge, DO  Elastic Bandages & Supports (WRIST SPLINT/COCK-UP/RIGHT L) MISC 1 each by Does not apply route as directed. 01/12/16   Pieter Partridge, DO  Fluocinolone Acetonide (DERMOTIC) 0.01 % OIL Place 1 drop into both ears at bedtime.     [provider]  gabapentin (NEURONTIN) 400 MG capsule Take 400 mg by mouth daily.    [provider]  gabapentin (NEURONTIN) 800 MG tablet Take 800 mg by mouth daily.     [provider]  hydrochlorothiazide (MICROZIDE) 12.5 MG capsule Take 12.5 mg by mouth daily.    [provider]  Hyoscyamine Sulfate SL (LEVSIN/SL) 0.125 MG SUBL Place 1 tablet under the tongue 3 (three) times daily as needed. abd pain 03/05/17   Mahala Menghini, PA-C  Insulin Human (INSULIN PUMP) SOLN Inject into the skin continuous. Novolog    [provider]  levothyroxine (SYNTHROID, LEVOTHROID) 125 MCG tablet Take 125 mcg by mouth daily before breakfast.  11/01/16   [provider]  lisinopril (PRINIVIL,ZESTRIL) 10 MG tablet take 1 tablet by mouth once daily 07/28/15   Cassandria Anger, MD  loratadine (CLARITIN) 10 MG tablet Take 10 mg by mouth daily.    [provider]  metFORMIN (GLUCOPHAGE) 500 MG tablet Take 500 mg by mouth 2 (two) times daily with a meal.    [provider]  Multiple Vitamins-Minerals (CENTRUM SILVER 50+WOMEN) TABS Take 1 tablet by mouth daily.     [provider]  Omega-3 Fatty Acids (FISH OIL) 1000 MG CAPS Take 1,000 mg by mouth daily.    [provider]  omeprazole (PRILOSEC) 40 MG capsule Take 40 mg by mouth 2 (two) times daily.     [provider]  topiramate (TOPAMAX) 50 MG tablet TAKE 1 TABLET BY MOUTH ONCE DAILY 01/04/18   Pieter Partridge, DO    Family History Family History  Problem Relation Age of Onset  . Cancer Mother 35         colon  . Cancer Father        renal cell  . Cancer Brother        bladder  . Cancer Sister        primary brain, and primary breast too  . Cancer Sister        breast   . Cancer Other        multiple aunts with breast, cervical, or colon cancer    Social History Social History   Tobacco Use  . Smoking status: Never Smoker  . Smokeless tobacco: Never Used  Substance Use Topics  . Alcohol use: No    Alcohol/week: 0.0 standard drinks  . Drug use: No  Allergies   Fentanyl   Review of Systems Review of Systems ROS: Statement: All systems negative except as marked or noted in the HPI; Constitutional: Negative for fever and chills. ; ; Eyes: Negative for eye pain, redness and discharge. ; ; ENMT: Negative for ear pain, hoarseness, nasal congestion, sinus pressure and sore throat. ; ; Cardiovascular: +CP, SOB, diaphoresis. Negative for palpitations, and peripheral edema. ; ; Respiratory: Negative for cough, wheezing and stridor. ; ; Gastrointestinal: Negative for nausea, vomiting, diarrhea, abdominal pain, blood in stool, hematemesis, jaundice and rectal bleeding. . ; ; Genitourinary: Negative for dysuria, flank pain and hematuria. ; ; Musculoskeletal: Negative for back pain and neck pain. Negative for swelling and trauma.; ; Skin: Negative for pruritus, rash, abrasions, blisters, bruising and skin lesion.; ; Neuro: Negative for headache, lightheadedness and neck stiffness. Negative for weakness, altered level of consciousness, altered mental status, extremity weakness, paresthesias, involuntary movement, seizure and syncope.       Physical Exam Updated Vital Signs BP (!) 149/95 (BP Location: Right Arm)   Pulse 91   Temp 97.9 F (36.6 C) (Oral)   Resp 17   Ht 5\' 9"  (1.753 m)   Wt 112.9 kg   SpO2 96%   BMI 36.77 kg/m   Physical Exam  1540: Physical examination:  Nursing notes reviewed; Vital signs and O2 SAT reviewed;  Constitutional: Well developed, Well  nourished, Well hydrated, In no acute distress; Head:  Normocephalic, atraumatic; Eyes: EOMI, PERRL, No scleral icterus; ENMT: Mouth and pharynx normal, Mucous membranes moist; Neck: Supple, Full range of motion, No lymphadenopathy; Cardiovascular: Regular rate and rhythm, No gallop; Respiratory: Breath sounds clear & equal bilaterally, No wheezes.  Speaking full sentences with ease, Normal respiratory effort/excursion; Chest: Nontender, Movement normal; Abdomen: Soft, Nontender, Nondistended, Normal bowel sounds; Genitourinary: No CVA tenderness; Extremities: Peripheral pulses normal, No tenderness, No edema, No calf edema or asymmetry.; Neuro: AA&Ox3, Major CN grossly intact.  Speech clear. No gross focal motor or sensory deficits in extremities.; Skin: Color normal, Warm, Dry.    ED Treatments / Results  Labs (all labs ordered are listed, but only abnormal results are displayed)   EKG EKG Interpretation  Date/Time:  Wednesday March 20 2018 15:06:08 EDT Ventricular Rate:  96 PR Interval:    QRS Duration: 135 QT Interval:  392 QTC Calculation: 496 R Axis:   46 Text Interpretation:  Sinus rhythm IVCD, consider atypical LBBB Baseline wander When compared with ECG of 11/09/2016 No significant change was found Confirmed by Francine Graven [phone]) on 03/20/2018 3:49:25 PM   Radiology   Procedures Procedures (including critical care time)  Medications Ordered in ED Medications  aspirin chewable tablet 243 mg (has no administration in time range)     Initial Impression / Assessment and Plan / ED Course  I have reviewed the triage vital signs and the nursing notes.  Pertinent labs & imaging results that were available during my care of the patient were reviewed by me and considered in my medical decision making (see chart for details).  MDM Reviewed: previous chart, nursing note and vitals Reviewed previous: labs and ECG Interpretation: labs, x-ray and ECG   Results for orders  placed or performed during the hospital encounter of 42/68/34  Basic metabolic panel  Result Value Ref Range   Sodium 141 135 - 145 mmol/L   Potassium 4.5 3.5 - 5.1 mmol/L   Chloride 109 98 - 111 mmol/L   CO2 24 22 - 32 mmol/L   Glucose, Bld  162 (H) 70 - 99 mg/dL   BUN 28 (H) 6 - 20 mg/dL   Creatinine, Ser 1.04 (H) 0.44 - 1.00 mg/dL   Calcium 9.3 8.9 - 10.3 mg/dL   GFR calc non Af Amer 58 (L) >60 mL/min   GFR calc Af Amer >60 >60 mL/min   Anion gap 8 5 - 15  Troponin I  Result Value Ref Range   Troponin I <0.03 <0.03 ng/mL  CBC with Differential  Result Value Ref Range   WBC 5.1 4.0 - 10.5 K/uL   RBC 3.87 3.87 - 5.11 MIL/uL   Hemoglobin 11.3 (L) 12.0 - 15.0 g/dL   HCT 35.0 (L) 36.0 - 46.0 %   MCV 90.4 78.0 - 100.0 fL   MCH 29.2 26.0 - 34.0 pg   MCHC 32.3 30.0 - 36.0 g/dL   RDW 14.2 11.5 - 15.5 %   Platelets 206 150 - 400 K/uL   Neutrophils Relative % 59 %   Neutro Abs 3.0 1.7 - 7.7 K/uL   Lymphocytes Relative 31 %   Lymphs Abs 1.6 0.7 - 4.0 K/uL   Monocytes Relative 7 %   Monocytes Absolute 0.4 0.1 - 1.0 K/uL   Eosinophils Relative 2 %   Eosinophils Absolute 0.1 0.0 - 0.7 K/uL   Basophils Relative 1 %   Basophils Absolute 0.0 0.0 - 0.1 K/uL   Dg Chest 2 View Result Date: 03/20/2018 CLINICAL DATA:  Chest pain extending to LEFT shoulder, shortness of breath, diaphoresis EXAM: CHEST - 2 VIEW COMPARISON:  07/21/2017 FINDINGS: Normal heart size, mediastinal contours, and pulmonary vascularity. Mild chronic bronchitic changes. No acute infiltrate, pleural effusion or pneumothorax. Intraspinal stimulator noted. No acute osseous findings. IMPRESSION: Mild chronic bronchitic changes without infiltrate. Electronically Signed   By: Lavonia Dana M.D.   On: 03/20/2018 17:45    1805:  Concerning HPI with hx intermediate risk nuclear stress test. Heart score 5-6; will admit. T/C returned from [place] Dr. [place], case discussed, including:  HPI, pertinent PM/SHx, VS/PE, dx testing, ED  course and treatment:  Agrees with admit, no heparin at this time/add if troponin elevated, states pt OK to stay at [facility] to cycle troponins and have Cards MD eval tomorrow morning.   1850:  T/C returned from [facility]. Shanon Brow, case discussed, including:  HPI, pertinent PM/SHx, VS/PE, dx testing, ED course and treatment, and d/w Cards MD:  Agreeable to admit.      Final Clinical Impressions(s) / ED Diagnoses   Final diagnoses:  None    ED Discharge Orders    None       Francine Graven, DO 03/23/18 1222

## 2018-03-21 DIAGNOSIS — Z6835 Body mass index (BMI) 35.0-35.9, adult: Secondary | ICD-10-CM | POA: Diagnosis not present

## 2018-03-21 DIAGNOSIS — E039 Hypothyroidism, unspecified: Secondary | ICD-10-CM

## 2018-03-21 DIAGNOSIS — Z803 Family history of malignant neoplasm of breast: Secondary | ICD-10-CM | POA: Diagnosis not present

## 2018-03-21 DIAGNOSIS — G4733 Obstructive sleep apnea (adult) (pediatric): Secondary | ICD-10-CM | POA: Diagnosis present

## 2018-03-21 DIAGNOSIS — I10 Essential (primary) hypertension: Secondary | ICD-10-CM | POA: Diagnosis present

## 2018-03-21 DIAGNOSIS — I25110 Atherosclerotic heart disease of native coronary artery with unstable angina pectoris: Secondary | ICD-10-CM | POA: Diagnosis present

## 2018-03-21 DIAGNOSIS — Z79890 Hormone replacement therapy (postmenopausal): Secondary | ICD-10-CM | POA: Diagnosis not present

## 2018-03-21 DIAGNOSIS — D649 Anemia, unspecified: Secondary | ICD-10-CM | POA: Diagnosis present

## 2018-03-21 DIAGNOSIS — I447 Left bundle-branch block, unspecified: Secondary | ICD-10-CM | POA: Diagnosis present

## 2018-03-21 DIAGNOSIS — Z794 Long term (current) use of insulin: Secondary | ICD-10-CM | POA: Diagnosis not present

## 2018-03-21 DIAGNOSIS — E10319 Type 1 diabetes mellitus with unspecified diabetic retinopathy without macular edema: Secondary | ICD-10-CM | POA: Diagnosis present

## 2018-03-21 DIAGNOSIS — Z800 Family history of malignant neoplasm of digestive organs: Secondary | ICD-10-CM | POA: Diagnosis not present

## 2018-03-21 DIAGNOSIS — E782 Mixed hyperlipidemia: Secondary | ICD-10-CM | POA: Diagnosis present

## 2018-03-21 DIAGNOSIS — G894 Chronic pain syndrome: Secondary | ICD-10-CM | POA: Diagnosis present

## 2018-03-21 DIAGNOSIS — G43909 Migraine, unspecified, not intractable, without status migrainosus: Secondary | ICD-10-CM | POA: Diagnosis present

## 2018-03-21 DIAGNOSIS — Z8052 Family history of malignant neoplasm of bladder: Secondary | ICD-10-CM | POA: Diagnosis not present

## 2018-03-21 DIAGNOSIS — E1065 Type 1 diabetes mellitus with hyperglycemia: Secondary | ICD-10-CM | POA: Diagnosis not present

## 2018-03-21 DIAGNOSIS — Z888 Allergy status to other drugs, medicaments and biological substances status: Secondary | ICD-10-CM | POA: Diagnosis not present

## 2018-03-21 DIAGNOSIS — I200 Unstable angina: Secondary | ICD-10-CM | POA: Diagnosis present

## 2018-03-21 DIAGNOSIS — Z7982 Long term (current) use of aspirin: Secondary | ICD-10-CM | POA: Diagnosis not present

## 2018-03-21 DIAGNOSIS — Z23 Encounter for immunization: Secondary | ICD-10-CM | POA: Diagnosis present

## 2018-03-21 DIAGNOSIS — R079 Chest pain, unspecified: Secondary | ICD-10-CM | POA: Diagnosis not present

## 2018-03-21 DIAGNOSIS — Z9641 Presence of insulin pump (external) (internal): Secondary | ICD-10-CM | POA: Diagnosis present

## 2018-03-21 LAB — TROPONIN I: Troponin I: <0.03 ng/mL (ref <0.03)

## 2018-03-21 LAB — GLUCOSE, CAPILLARY
GLUCOSE-CAPILLARY: 96 mg/dL (ref 70–99)
Glucose-Capillary: 86 mg/dL (ref 70–99)
Glucose-Capillary: 156 mg/dL — ABNORMAL HIGH (ref 70–99)

## 2018-03-21 LAB — HEMOGLOBIN A1C
Hgb A1c MFr Bld: 7.8 % — ABNORMAL HIGH (ref 4.8–5.6)
MEAN PLASMA GLUCOSE: 177.16 mg/dL

## 2018-03-21 LAB — APTT: APTT: 28 s (ref 24–36)

## 2018-03-21 LAB — HEPARIN LEVEL (UNFRACTIONATED)
Heparin Unfractionated: <0.1 IU/mL — ABNORMAL LOW (ref 0.30–0.70)
Heparin Unfractionated: 0.39 IU/mL (ref 0.30–0.70)

## 2018-03-21 MED ORDER — ASPIRIN EC 81 MG PO TBEC
81.0000 mg | DELAYED_RELEASE_TABLET | Freq: Every day | ORAL | Status: DC
  Administered 2018-03-23: 81 mg via ORAL
  Filled 2018-03-21: qty 1

## 2018-03-21 MED ORDER — SODIUM CHLORIDE 0.9 % WEIGHT BASED INFUSION
1.0000 mL/kg/h | INTRAVENOUS | Status: DC
  Administered 2018-03-22: 1 mL/kg/h via INTRAVENOUS

## 2018-03-21 MED ORDER — ASPIRIN EC 81 MG PO TBEC: 81.0000 mg | DELAYED_RELEASE_TABLET | Freq: Every day | ORAL | Status: DC

## 2018-03-21 MED ORDER — HEPARIN BOLUS VIA INFUSION
4000.0000 [IU] | Freq: Once | INTRAVENOUS | Status: AC
  Administered 2018-03-21: 4000 [IU] via INTRAVENOUS
  Filled 2018-03-21: qty 4000

## 2018-03-21 MED ORDER — SODIUM CHLORIDE 0.9 % IV SOLN: 250.0000 mL | INTRAVENOUS | Status: DC | PRN

## 2018-03-21 MED ORDER — SODIUM CHLORIDE 0.9% FLUSH: 3.0000 mL | INTRAVENOUS | Status: DC | PRN

## 2018-03-21 MED ORDER — SODIUM CHLORIDE 0.9% FLUSH
3.0000 mL | Freq: Two times a day (BID) | INTRAVENOUS | Status: DC
  Administered 2018-03-21: 3 mL via INTRAVENOUS

## 2018-03-21 MED ORDER — INSULIN PUMP
Freq: Three times a day (TID) | SUBCUTANEOUS | Status: DC
  Administered 2018-03-21: 3.2 via SUBCUTANEOUS
  Administered 2018-03-21: 2.7 via SUBCUTANEOUS
  Administered 2018-03-21: 2.8 via SUBCUTANEOUS
  Administered 2018-03-21: 1.2 via SUBCUTANEOUS
  Administered 2018-03-22: 4.75 via SUBCUTANEOUS
  Administered 2018-03-22: 0.6 via SUBCUTANEOUS
  Administered 2018-03-22: 2 via SUBCUTANEOUS
  Administered 2018-03-23: 06:00:00 via SUBCUTANEOUS
  Administered 2018-03-23: 3.4 via SUBCUTANEOUS
  Filled 2018-03-21: qty 1

## 2018-03-21 MED ORDER — SODIUM CHLORIDE 0.9 % WEIGHT BASED INFUSION
3.0000 mL/kg/h | INTRAVENOUS | Status: DC
  Administered 2018-03-22: 3 mL/kg/h via INTRAVENOUS

## 2018-03-21 MED ORDER — ASPIRIN 81 MG PO CHEW
81.0000 mg | CHEWABLE_TABLET | ORAL | Status: AC
  Administered 2018-03-22: 81 mg via ORAL
  Filled 2018-03-21: qty 1

## 2018-03-21 MED ORDER — HEPARIN (PORCINE) IN NACL 100-0.45 UNIT/ML-% IJ SOLN
1300.0000 [IU]/h | INTRAMUSCULAR | Status: DC
  Administered 2018-03-21 (×2): 1200 [IU]/h via INTRAVENOUS
  Filled 2018-03-21 (×2): qty 250

## 2018-03-21 NOTE — Consult Note (Addendum)
Cardiology Consult    Patient ID: Jodi Ray; [id number]; 10-13-1960   Admit date: 03/20/2018 Date of Consult: 03/21/2018  Primary Care Provider: Monico Blitz, MD Primary Cardiologist: Jodi Sable, MD   Patient Profile    Jodi Ray is a 57 y.o. female with past medical history of HTN, HLD, IDDM, and known LBBB who is being seen today for the evaluation of chest pain at the request of Jodi Ray.   History of Present Illness    Jodi Ray was last examined by Jodi Ray in 11/2016 and denied any recent chest pain or dyspnea on exertion at that time. A stress test had been performed in 10/2016 due to her left bundle Jodi Ray block and showed a moderate sized, moderate intensity, mid to apical anterior septal defect which was partially reversible and a significant degree of breast attenuation was noted which may have contributed to the defect, however a moderate degree of ischemia could not be excluded.  It was overall read as an intermediate risk study. Due to her being asymptomatic at the time of her follow-up visit, continued medical management was recommended and she was continued on ASA 81 mg daily and started on Lipitor 20 mg daily.  She called the office on 03/20/2018 reporting an episode of chest pain with left arm tingling which lasted approximately 45 seconds and reported feeling fatigued after the event. It was advised she go to the ED for further evaluation. In talking with the patient, she reports being in her usual state of health until yesterday morning. She developed chest discomfort which radiated into her left arm with associated diaphoresis. Symptoms spontaneously resolved within 45-60 seconds and she initially thought it might have been due to hypoglycemia but glucose was 119 at that time. She went for a walk with her dog later in the afternoon and denies any recurrent pain at that time but did feel fatigued. She typically exercises for 20-30 minutes daily by  walking on the treadmill or using an exercise bike and denies any recent anginal symptoms with these activities. Denies any recent dyspnea on exertion, orthopnea, PND, lower extremity edema, palpitations, or presyncope.   Has known HTN, HLD, and IDDM as outlined above. No prior tobacco use and no known family history of CAD.   Labs show WBC 5.1, Hgb 11.3, platelets 206, Na+ 141, K+ 4.5, and creatinine 1.04.  Initial and cyclic troponin values have been negative.  CXR shows mild chronic bronchitic changes without acute infiltrate.  EKG shows normal sinus rhythm, heart rate 96, with known left bundle Jodi Ray block. She has been followed on telemetry overnight with no significant arrhythmias noted.    Past Medical History:  Diagnosis Date  . Adult RDS ([place])   . Anemia   . Brain aneurysm    frontal lobe  . Chronic pain   . Diabetes mellitus without complication ([place])   . Headache   . History of left bundle Eldena Dede block (LBBB)   . HTN (hypertension)   . Hx of cardiovascular stress test 10/2016   intermediate risk study  . Hypothyroidism   . Neuromuscular disorder ([place])   . Neuropathy   . Obesity   . OSA on CPAP 03/10/2015  . Retinopathy   . Sleep apnea   . Varicose veins of both lower extremities     Past Surgical History:  Procedure Laterality Date  . BIOPSY  11/14/2016   Procedure: BIOPSY;  Surgeon: Danie Binder, MD;  Location: [facility];  Service:  Endoscopy;;  colon gastric duodenal  . BREAST SURGERY Left 1994   Lumpectomy  . COLONOSCOPY WITH PROPOFOL N/A 11/14/2016   Dr. Oneida Alar: Moderately redundant rectosigmoid colon. Random colon biopsies benign. Internal hemorrhoids. Surveillance colonoscopy in 5 years.  . ESOPHAGOGASTRODUODENOSCOPY (EGD) WITH PROPOFOL N/A 11/14/2016   Dr. Oneida Alar: Esophagus normal. Moderate gastritis, few gastric polyps. Duodenal biopsies negative. Fundic gland polyp gastric polyp area no H pylori.  Marland Kitchen FOOT SURGERY    . LEG SURGERY Right   . REPLACEMENT  TOTAL KNEE Right   . SPINAL CORD STIMULATOR IMPLANT       Home Medications:  Prior to Admission medications   Medication Sig Start Date End Date Taking? Authorizing Provider  aspirin EC 81 MG tablet Take 81 mg by mouth daily.   Yes [provider]  atorvastatin (LIPITOR) 20 MG tablet TAKE 1 TABLET BY MOUTH ONCE DAILY 01/08/18  Yes Herminio Commons, MD  Bevacizumab (AVASTIN IV) Place into the left eye once.   Yes [provider]  Cholecalciferol (VITAMIN D-3) 5000 units TABS Take 5,000 Units by mouth every 3 (three) days.    Yes [provider]  dicyclomine (BENTYL) 10 MG capsule Take 10 mg by mouth 4 (four) times daily -  before meals and at bedtime.   Yes [provider]  Fluocinolone Acetonide (DERMOTIC) 0.01 % OIL Place 1 drop into both ears 3 (three) times a week.    Yes [provider]  gabapentin (NEURONTIN) 400 MG capsule Take 400-800 mg by mouth See admin instructions. 400mg  in the morning and 800mg  at bedtime   Yes [provider]  hydrochlorothiazide (MICROZIDE) 12.5 MG capsule Take 12.5 mg by mouth daily.   Yes [provider]  Insulin Human (INSULIN PUMP) SOLN Inject into the skin continuous. Novolog (up to 60 units as of 02/24/2018)   Yes [provider]  levothyroxine (SYNTHROID, LEVOTHROID) 125 MCG tablet Take 125 mcg by mouth daily before breakfast.  11/01/16  Yes [provider]  lisinopril (PRINIVIL,ZESTRIL) 10 MG tablet take 1 tablet by mouth once daily Patient taking differently: Take 10 mg by mouth daily.  07/28/15  Yes Cassandria Anger, MD  loratadine (CLARITIN) 10 MG tablet Take 10 mg by mouth 2 (two) times daily.    Yes [provider]  metFORMIN (GLUCOPHAGE) 500 MG tablet Take 500 mg by mouth 2 (two) times daily with a meal.   Yes [provider]  Multiple Vitamins-Minerals (CENTRUM SILVER 50+WOMEN) TABS Take 1 tablet by mouth daily.    Yes [provider]    NOVOLOG 100 UNIT/ML injection Inject into the skin continuous. Via insulin pump as directed up to 60 units (CGM) 02/24/18  Yes [provider]  Omega-3 Fatty Acids (FISH OIL) 1000 MG CAPS Take 1,000 mg by mouth daily.   Yes [provider]  omeprazole (PRILOSEC) 40 MG capsule Take 40 mg by mouth 2 (two) times daily.    Yes [provider]  Polyethylene Glycol 400 (BLINK TEARS) 0.25 % SOLN Apply 1 drop to eye daily as needed (for eye irritation/dryness).   Yes [provider]  topiramate (TOPAMAX) 50 MG tablet TAKE 1 TABLET BY MOUTH ONCE DAILY Patient taking differently: Take 50 mg by mouth every evening.  01/04/18  Yes Jaffe, Adam R, DO  White Petrolatum-Mineral Oil (GENTEAL TEARS NIGHT-TIME) OINT Apply 1 drop to eye daily as needed (for dryness/irritation).   Yes [provider]    Inpatient Medications: Scheduled Meds: .  aspirin EC  325 mg Oral Daily  . atorvastatin  20 mg Oral q1800  . [START ON 03/23/2018] cholecalciferol  5,000 Units Oral Q3 days  . dicyclomine  10 mg Oral TID AC & HS  . gabapentin  400 mg Oral Daily   And  . gabapentin  800 mg Oral QHS  . hydrochlorothiazide  12.5 mg Oral Daily  . insulin pump   Subcutaneous TID AC, HS, 0200  . levothyroxine  125 mcg Oral QAC breakfast  . lisinopril  10 mg Oral Daily  . loratadine  10 mg Oral Daily  . metFORMIN  500 mg Oral BID WC  . multivitamin with minerals  1 tablet Oral Daily  . pneumococcal 23 valent vaccine  0.5 mL Intramuscular Tomorrow-1000  . topiramate  50 mg Oral QHS   Continuous Infusions:  PRN Meds: acetaminophen, artificial tears, ondansetron (ZOFRAN) IV, polyvinyl alcohol  Allergies:    Allergies  Allergen Reactions  . Fentanyl Nausea And Vomiting and Other (See Comments)    Nausea, vomiting, loss of consciousness, requiring reversal    Social History:   Social History   Socioeconomic History  . Marital status: Widowed    Spouse name: Not on file  . Number  of children: Not on file  . Years of education: Not on file  . Highest education level: Not on file  Occupational History  . Not on file  Social Needs  . Financial resource strain: Not on file  . Food insecurity:    Worry: Not on file    Inability: Not on file  . Transportation needs:    Medical: Not on file    Non-medical: Not on file  Tobacco Use  . Smoking status: Never Smoker  . Smokeless tobacco: Never Used  Substance and Sexual Activity  . Alcohol use: No    Alcohol/week: 0.0 standard drinks  . Drug use: No  . Sexual activity: Yes    Partners: Male  Lifestyle  . Physical activity:    Days per week: Not on file    Minutes per session: Not on file  . Stress: Not on file  Relationships  . Social connections:    Talks on phone: Not on file    Gets together: Not on file    Attends religious service: Not on file    Active member of club or organization: Not on file    Attends meetings of clubs or organizations: Not on file    Relationship status: Not on file  . Intimate partner violence:    Fear of current or ex partner: Not on file    Emotionally abused: Not on file    Physically abused: Not on file    Forced sexual activity: Not on file  Other Topics Concern  . Not on file  Social History Narrative   Drinks about 1-2 cups of caffeine daily.     Family History:    Family History  Problem Relation Age of Onset  . Cancer Mother 86       colon  . Cancer Father        renal cell  . Cancer Brother        bladder  . Cancer Sister        primary brain, and primary breast too  . Cancer Sister        breast   . Cancer Other        multiple aunts with breast, cervical, or colon cancer  Review of Systems    General:  No chills, fever, night sweats or weight changes.  Cardiovascular:  No dyspnea on exertion, edema, orthopnea, palpitations, paroxysmal nocturnal dyspnea. Positive for chest pain and left arm pain.  Dermatological: No rash,  lesions/masses Respiratory: No cough, dyspnea Urologic: No hematuria, dysuria Abdominal:   No nausea, vomiting, diarrhea, bright red blood per rectum, melena, or hematemesis Neurologic:  No visual changes, wkns, changes in mental status. All other systems reviewed and are otherwise negative except as noted above.  Physical Exam/Data    Vitals:   03/20/18 2034 03/20/18 2123 03/21/18 0136 03/21/18 0520  BP: (!) 96/53 (!) 100/53 111/60 110/61  Pulse: 79 75 72 75  Resp:  18 18 20   Temp:  (!) 97.5 F (36.4 C) 97.8 F (36.6 C) 97.6 F (36.4 C)  TempSrc:  Oral Oral Oral  SpO2: 94% 98% 95% 97%  Weight:      Height:        Intake/Output Summary (Last 24 hours) at 03/21/2018 0931 Last data filed at 03/20/2018 2100 Gross per 24 hour  Intake 240 ml  Output -  Net 240 ml   Filed Weights   03/20/18 1445  Weight: 112.9 kg   Body mass index is 36.77 kg/m.   General: Pleasant, obese Caucasian female appearing in NAD Psych: Normal affect. Neuro: Alert and oriented X 3. Moves all extremities spontaneously. HEENT: Normal  Neck: Supple without bruits or JVD. Lungs:  Resp regular and unlabored, CTA without wheezing or rales. Heart: RRR no s3, s4, or murmurs. Abdomen: Soft, non-tender, non-distended, BS + x 4.  Extremities: No clubbing or cyanosis. Trace edema with compression stockings in place. DP/PT/Radials 2+ and equal bilaterally.    EKG:  The EKG was personally reviewed and demonstrates: NSR, HR 96, with known left bundle Maanya Hippert block.   Labs/Studies     Relevant CV Studies:  NST: 11/21/2016  Left bundle Rickeya Manus block present throughout.  Moderate sized, moderate intensity, mid to apical anteroseptal defect that is partially reversible. Significant degree of breast attenuation affects the study and may contribute to this defect, however cannot exclude a moderate degree of ischemia.  This is an intermediate risk study.  Nuclear stress EF: 73%.  Laboratory  Data:  Chemistry Recent Labs  Lab 03/20/18 1613  NA 141  K 4.5  CL 109  CO2 24  GLUCOSE 162*  BUN 28*  CREATININE 1.04*  CALCIUM 9.3  GFRNONAA 58*  GFRAA >60  ANIONGAP 8    No results for input(s): PROT, ALBUMIN, AST, ALT, ALKPHOS, BILITOT in the last 168 hours. Hematology Recent Labs  Lab 03/20/18 1613  WBC 5.1  RBC 3.87  HGB 11.3*  HCT 35.0*  MCV 90.4  MCH 29.2  MCHC 32.3  RDW 14.2  PLT 206   Cardiac Enzymes Recent Labs  Lab 03/20/18 1613 03/20/18 1925 03/20/18 2217 03/21/18 0055  TROPONINI <0.03 <0.03 <0.03 <0.03   No results for input(s): TROPIPOC in the last 168 hours.  BNPNo results for input(s): BNP, PROBNP in the last 168 hours.  DDimer No results for input(s): DDIMER in the last 168 hours.  Radiology/Studies:  Dg Chest 2 View  Result Date: 03/20/2018 CLINICAL DATA:  Chest pain extending to LEFT shoulder, shortness of breath, diaphoresis EXAM: CHEST - 2 VIEW COMPARISON:  07/21/2017 FINDINGS: Normal heart size, mediastinal contours, and pulmonary vascularity. Mild chronic bronchitic changes. No acute infiltrate, pleural effusion or pneumothorax. Intraspinal stimulator noted. No acute osseous findings. IMPRESSION: Mild chronic bronchitic  changes without infiltrate. Electronically Signed   By: Lavonia Dana M.D.   On: 03/20/2018 17:45   Assessment & Plan    1. Chest Pain with Mixed Typical and Atypical Features - she presented for evaluation of chest pain which occurred at rest and spontaneously resolved within 45-60 seconds. Does report associated left arm pain and diaphoresis at that time. No repeat episodes of chest pain since admission. Had been exercising for 20-30 minutes daily PTA without any anginal symptoms.  - initial and cyclic troponin values have been negative. EKG shows NSR, HR 96, with known left bundle Adolf Ormiston block. Most recent NST in 10/2016 showed a moderate sized, moderate intensity, mid to apical anterior septal defect which was partially  reversible and a significant degree of breast attenuation was noted which may have contributed to the defect, however a moderate degree of ischemia could not be excluded.  - while her presenting symptoms have mixed features, she does have multiple cardiac risk factors including HTN, HLD, and IDDM. A repeat NST would not be ideal given her previously difficult to read study. Will discuss with Dr. Harl Bowie in regards to further ischemic evaluation with an outpatient Coronary CT versus definitive evaluation with a cardiac catheterization (of which the patient currently wishes to avoid if at all possible).  - continue ASA and statin therapy.   2. HTN - BP has been variable at 96/49 - 149/95 within the past 24 hours. At 110/61 on most recent check. - continue PTA Lisinopril 10mg  daily and HCTZ 12.5mg  daily.   3. HLD - followed by PCP in the outpatient setting. Remains on Atorvastatin 20mg  daily.   4. IDDM - Hgb A1c at 7.1 in 02/2017. Repeat labs pending.    For questions or updates, please contact [place] Please consult [email or web address] for contact info under Cardiology/STEMI.  Signed, Erma Heritage, PA-C 03/21/2018, 9:31 AM Pager: [phone]  Patient seen and discussed with PA Ahmed Prima, I agree with her documentation. 57 yo female with history of LBBB, DM1, HTN, OSA admitted with chest pain. As part of prior workup for her LBBB in setting of CAD risk factors she had a nuclear stress test 10/2016 with a moderate sized mid to apical anteroseptal defect that was partially reversible. There was suspected significant breast attenuation, could not exclude moderate ischemia, overall intermediate risk.   Presents with episode of chest pain. Occurred yesterday around 11AM, she had just sat down after washing dishes. 8/10 crushing pain left chest, numbness down left arm. +SOB +diaphoretic Severe pain lasted just about 45 seconds. She got up to take her dog out and had ongoing SOB, severe fatigue.  New for her, she rides a stationary bike 30 min a day 4 days a week usually without any troubles. Came back inside and sat down. Ongoing fatigue. Drove herself to ER. Walking in from parking lot causes significant DOE, some recurrence of chest pain. Has been pain free overnight and this morning, symptoms all resolved this AM.    K 4.5 Cr 1.04 WBC 5.1 Hgb 11.3 Plt 206 Trop neg x 4 CXR no acute process EKG SR, LBBB  Patient with multiple CAD risk factors including diabetes, HTN, hyperlipidemia, and chronic LBBB. Prior nuclear stress test affected by breast attenuation, possible moderate ischemia. Clearly she is at particularly high risk for underlying CAD. Though the severe pain was fairly short in duration, its characteristics are very concerning for unstable angina, as well as the lingering fatigue and SOB she had after the severe pain  episode. Would recommend cath to definitively evaluate for obstructive CAD. Patient is in agreement. Will ask medicine to help manage her npo status with her insulin pump, she did eat today.  Medical therapy with ASA, atorva, ACE-I. If CAD confirmed add beta blocker. Would treat as unstable angina at this time and start heparin drip.   I have asked patient to be transferred to medicine service at [facility], and asked medicine to help with arrangements for her npo status tonight regarding her insulin pump and DM1    I have reviewed the risks, indications, and alternatives to cardiac catheterization, possible angioplasty, and stenting with the patient today. Risks include but are not limited to bleeding, infection, vascular injury, stroke, myocardial infection, arrhythmia, kidney injury, radiation-related injury in the case of prolonged fluoroscopy use, emergency cardiac surgery, and death. The patient understands the risks of serious complication is 1-2 in 5726 with diagnostic cardiac cath and 1-2% or less with angioplasty/stenting.    Carlyle Dolly MD

## 2018-03-21 NOTE — Progress Notes (Signed)
PROGRESS NOTE    Jodi Ray  EXB:[id number] DOB: February 25, 1961 DOA: 03/20/2018 PCP: Monico Blitz, MD    Brief Narrative:  57 year old female with a history of coronary artery disease with abnormal stress test in the past, hypertension, insulin-dependent diabetes on insulin pump, chronic pain syndrome status post spinal stimulator, admitted to the hospital with typical chest pain concerning for unstable angina.  Cardiac enzymes have thus far been negative.  Seen by cardiology and plan is for transfer to [facility] for cardiac catheterization.   Assessment & Plan:   Principal Problem:   Chest pain Active Problems:   Severe obesity (BMI >= 40) (HCC)   Type I diabetes mellitus, uncontrolled ([place])   Benign hypertension   Unstable angina ([place])   1. Chest pain, concern for unstable angina.  Patient has typical symptoms of cardiac chest pain.  Cardiac enzymes thus far have been negative.  She has not had any acute EKG changes.  She does have a history of prior abnormal stress test.  Seen by cardiology and plans are to pursue cardiac catheterization in a.m. (since she is already eaten today).  She will be started on heparin infusion.  She is continued on aspirin and statin. 2. Insulin dependent diabetes.  Continue on insulin pump.  Monitor blood sugars closely since she will be n.p.o. after midnight. 3. Hypothyroidism.  Continue on Synthroid 4. Hyperlipidemia.  Continue statin 5. Hypertension.  Blood pressure stable.  Continue on hydrochlorothiazide. 6. History of migraines.  Continue on Topamax.    DVT prophylaxis: heparin infusion Code Status: full code Family Communication: discussed with family at the bedside Disposition Plan: transfer to [facility] for cardiac cath in AM   Consultants:   cardiology  Procedures:     Antimicrobials:       Subjective: No complaints of chest pain or shortness of breath  Objective: Vitals:   03/20/18 2034 03/20/18 2123 03/21/18 0136  03/21/18 0520  BP: (!) 96/53 (!) 100/53 111/60 110/61  Pulse: 79 75 72 75  Resp:  18 18 20   Temp:  (!) 97.5 F (36.4 C) 97.8 F (36.6 C) 97.6 F (36.4 C)  TempSrc:  Oral Oral Oral  SpO2: 94% 98% 95% 97%  Weight:      Height:        Intake/Output Summary (Last 24 hours) at 03/21/2018 1334 Last data filed at 03/21/2018 0942 Gross per 24 hour  Intake 480 ml  Output -  Net 480 ml   Filed Weights   03/20/18 1445  Weight: 112.9 kg    Examination:  General exam: Appears calm and comfortable  Respiratory system: Clear to auscultation. Respiratory effort normal. Cardiovascular system: S1 & S2 heard, RRR. No JVD, murmurs, rubs, gallops or clicks. No pedal edema. Gastrointestinal system: Abdomen is nondistended, soft and nontender. No organomegaly or masses felt. Normal bowel sounds heard. Central nervous system: Alert and oriented. No focal neurological deficits. Extremities: Symmetric 5 x 5 power. Skin: No rashes, lesions or ulcers Psychiatry: Judgement and insight appear normal. Mood & affect appropriate.     Data Reviewed: I have personally reviewed following labs and imaging studies  CBC: Recent Labs  Lab 03/20/18 1613  WBC 5.1  NEUTROABS 3.0  HGB 11.3*  HCT 35.0*  MCV 90.4  PLT 102   Basic Metabolic Panel: Recent Labs  Lab 03/20/18 1613  NA 141  K 4.5  CL 109  CO2 24  GLUCOSE 162*  BUN 28*  CREATININE 1.04*  CALCIUM 9.3   GFR:  Estimated Creatinine Clearance: 78.1 mL/min (A) (by C-G formula based on SCr of 1.04 mg/dL (H)). Liver Function Tests: No results for input(s): AST, ALT, ALKPHOS, BILITOT, PROT, ALBUMIN in the last 168 hours. No results for input(s): LIPASE, AMYLASE in the last 168 hours. No results for input(s): AMMONIA in the last 168 hours. Coagulation Profile: No results for input(s): INR, PROTIME in the last 168 hours. Cardiac Enzymes: Recent Labs  Lab 03/20/18 1613 03/20/18 1925 03/20/18 2217 03/21/18 0055  TROPONINI <0.03 <0.03  <0.03 <0.03   BNP (last 3 results) No results for input(s): PROBNP in the last 8760 hours. HbA1C: No results for input(s): HGBA1C in the last 72 hours. CBG: Recent Labs  Lab 03/21/18 0743 03/21/18 1128  GLUCAP 86 156*   Lipid Profile: No results for input(s): CHOL, HDL, LDLCALC, TRIG, CHOLHDL, LDLDIRECT in the last 72 hours. Thyroid Function Tests: No results for input(s): TSH, T4TOTAL, FREET4, T3FREE, THYROIDAB in the last 72 hours. Anemia Panel: No results for input(s): VITAMINB12, FOLATE, FERRITIN, TIBC, IRON, RETICCTPCT in the last 72 hours. Sepsis Labs: No results for input(s): PROCALCITON, LATICACIDVEN in the last 168 hours.  No results found for this or any previous visit (from the past 240 hour(s)).       Radiology Studies: Dg Chest 2 View  Result Date: 03/20/2018 CLINICAL DATA:  Chest pain extending to LEFT shoulder, shortness of breath, diaphoresis EXAM: CHEST - 2 VIEW COMPARISON:  07/21/2017 FINDINGS: Normal heart size, mediastinal contours, and pulmonary vascularity. Mild chronic bronchitic changes. No acute infiltrate, pleural effusion or pneumothorax. Intraspinal stimulator noted. No acute osseous findings. IMPRESSION: Mild chronic bronchitic changes without infiltrate. Electronically Signed   By: Lavonia Dana M.D.   On: 03/20/2018 17:45        Scheduled Meds: . [START ON 03/22/2018] aspirin EC  81 mg Oral Daily  . atorvastatin  20 mg Oral q1800  . [START ON 03/23/2018] cholecalciferol  5,000 Units Oral Q3 days  . dicyclomine  10 mg Oral TID AC & HS  . gabapentin  400 mg Oral Daily   And  . gabapentin  800 mg Oral QHS  . hydrochlorothiazide  12.5 mg Oral Daily  . insulin pump   Subcutaneous TID AC, HS, 0200  . levothyroxine  125 mcg Oral QAC breakfast  . lisinopril  10 mg Oral Daily  . loratadine  10 mg Oral Daily  . metFORMIN  500 mg Oral BID WC  . multivitamin with minerals  1 tablet Oral Daily  . topiramate  50 mg Oral QHS   Continuous  Infusions: . heparin 1,200 Units/hr (03/21/18 1246)     LOS: 0 days    Time spent: 39mins    Kathie Dike, MD [facility] Pager [phone]  If 7PM-7AM, please contact night-coverage [email or web address] Password [facility] 03/21/2018, 1:34 PM

## 2018-03-21 NOTE — Progress Notes (Signed)
Inpatient Diabetes Program Recommendations  AACE/ADA: New Consensus Statement on Inpatient Glycemic Control (2015)  Target Ranges:  Prepandial:   less than 140 mg/dL      Peak postprandial:   less than 180 mg/dL (1-2 hours)      Critically ill patients:  140 - 180 mg/dL   Lab Results  Component Value Date   GLUCAP 86 03/21/2018   HGBA1C 9.1 (H) 07/23/2015    Review of Glycemic Control  Diabetes history: DM1 Outpatient Diabetes medications: Medtronic insulin pump + Metformin 500 mg bid Current orders for Inpatient glycemic control: Insulin Pump  Inpatient Diabetes Program Recommendations:   Spoke with patient by phone (DM coordinator not @ [facility]). Patient states she sees Dr. Chalmers Cater as her endocrinologist and has had type 1 diabetes since she was 48. Patient changed her insertion site @ home 03/20/18 prior to admission. Nurses, please assure patient signs insulin contract and print flowsheet for patient to place @ bedside and list amount of insulin given for meals. Patient verbalized understanding of covering carbohydrates for meals and protocol for insulin pump management.  Thank you, Nani Gasser. Atlas Crossland, RN, MSN, CDE  Diabetes Coordinator Inpatient Glycemic Control Team Team Pager [phone] (8am-5pm) 03/21/2018 8:10 AM

## 2018-03-21 NOTE — Progress Notes (Signed)
ANTICOAGULATION CONSULT NOTE - Initial Consult  Pharmacy Consult for heparin Indication: chest pain/ACS  Allergies  Allergen Reactions  . Fentanyl Nausea And Vomiting and Other (See Comments)    Nausea, vomiting, loss of consciousness, requiring reversal    Patient Measurements: Height: 5\' 9"  (175.3 cm) Weight: 249 lb (112.9 kg) IBW/kg (Calculated) : 66.2 Heparin Dosing Weight: 91.8kg   Vital Signs: Temp: 97.6 F (36.4 C) (08/22 0520) Temp Source: Oral (08/22 0520) BP: 110/61 (08/22 0520) Pulse Rate: 75 (08/22 0520)  Labs: Recent Labs    03/20/18 1613 03/20/18 1925 03/20/18 2217 03/21/18 0055  HGB 11.3*  --   --   --   HCT 35.0*  --   --   --   PLT 206  --   --   --   CREATININE 1.04*  --   --   --   TROPONINI <0.03 <0.03 <0.03 <0.03    Estimated Creatinine Clearance: 78.1 mL/min (A) (by C-G formula based on SCr of 1.04 mg/dL (H)).   Medical History: Past Medical History:  Diagnosis Date  . Adult RDS ([place])   . Anemia   . Brain aneurysm    frontal lobe  . Chronic pain   . Diabetes mellitus without complication ([place])   . Headache   . History of left bundle branch block (LBBB)   . HTN (hypertension)   . Hx of cardiovascular stress test 10/2016   intermediate risk study  . Hypothyroidism   . Neuromuscular disorder ([place])   . Neuropathy   . Obesity   . OSA on CPAP 03/10/2015  . Retinopathy   . Sleep apnea   . Varicose veins of both lower extremities       Assessment:  Pharmacy consulted to dose heparin for this 19 yof with chest pain. Patient has a hx of LBBB,  HTN, and IDDM. Troponin is <0.03ng/mL.   Goal of Therapy:  Heparin level 0.3-0.7 units/ml Monitor platelets by anticoagulation protocol: Yes   Plan:  Give 4000 units bolus x 1 Start heparin infusion at 1200 units/hr Check anti-Xa level in 6-8 hours and daily while on heparin Continue to monitor H&H and platelets   Despina Pole, Pharm. D. Clinical Pharmacist 03/21/2018 11:36 AM

## 2018-03-21 NOTE — Progress Notes (Signed)
BS taken by patient 134

## 2018-03-21 NOTE — Progress Notes (Signed)
ANTICOAGULATION CONSULT NOTE - [place] for heparin Indication: chest pain/ACS  Allergies  Allergen Reactions  . Fentanyl Nausea And Vomiting and Other (See Comments)    Nausea, vomiting, loss of consciousness, requiring reversal    Patient Measurements: Height: 5\' 9"  (175.3 cm) Weight: 249 lb (112.9 kg) IBW/kg (Calculated) : 66.2 Heparin Dosing Weight: 91.8kg   Vital Signs: Temp: 97.8 F (36.6 C) (08/22 1916) Temp Source: Oral (08/22 1916) BP: 138/90 (08/22 1916) Pulse Rate: 85 (08/22 1916)  Labs: Recent Labs    03/20/18 1613 03/20/18 1925 03/20/18 2217 03/21/18 0055 03/21/18 1202 03/21/18 1814  HGB 11.3*  --   --   --   --   --   HCT 35.0*  --   --   --   --   --   PLT 206  --   --   --   --   --   APTT  --   --   --   --  28  --   HEPARINUNFRC  --   --   --   --  <0.10* 0.39  CREATININE 1.04*  --   --   --   --   --   TROPONINI <0.03 <0.03 <0.03 <0.03  --   --     Estimated Creatinine Clearance: 78.1 mL/min (A) (by C-G formula based on SCr of 1.04 mg/dL (H)).   Medical History: Past Medical History:  Diagnosis Date  . Adult RDS ([place])   . Anemia   . Brain aneurysm    frontal lobe  . Chronic pain   . Diabetes mellitus without complication ([place])   . Headache   . History of left bundle branch block (LBBB)   . HTN (hypertension)   . Hx of cardiovascular stress test 10/2016   intermediate risk study  . Hypothyroidism   . Neuromuscular disorder ([place])   . Neuropathy   . Obesity   . OSA on CPAP 03/10/2015  . Retinopathy   . Sleep apnea   . Varicose veins of both lower extremities       Assessment:   47 yoF with CP to start on IV heparin. Heparin level therapeutic at 0.39. LHC planned at some point tomorrow.   Goal of Therapy:  Heparin level 0.3-0.7 units/ml Monitor platelets by anticoagulation protocol: Yes   Plan:  -Continue IV heparin 1200 units/hr -Recheck heparin level with am labs  Arrie Senate, PharmD,  BCPS Clinical Pharmacist [phone] Please check AMION for all [place] numbers 03/21/2018

## 2018-03-21 NOTE — Progress Notes (Signed)
Called report to Lorra Hals at [facility] on [address]room 22[address].

## 2018-03-22 ENCOUNTER — Encounter (HOSPITAL_COMMUNITY): Payer: Self-pay | Admitting: Cardiovascular Disease

## 2018-03-22 ENCOUNTER — Other Ambulatory Visit: Payer: Self-pay

## 2018-03-22 ENCOUNTER — Encounter (HOSPITAL_COMMUNITY)
Admission: EM | Disposition: A | Discharge status: Home / Self Care | Payer: Self-pay | Source: Home / Self Care | Attending: Internal Medicine

## 2018-03-22 DIAGNOSIS — I200 Unstable angina: Secondary | ICD-10-CM

## 2018-03-22 DIAGNOSIS — I25110 Atherosclerotic heart disease of native coronary artery with unstable angina pectoris: Principal | ICD-10-CM

## 2018-03-22 DIAGNOSIS — E782 Mixed hyperlipidemia: Secondary | ICD-10-CM

## 2018-03-22 DIAGNOSIS — I10 Essential (primary) hypertension: Secondary | ICD-10-CM

## 2018-03-22 HISTORY — PX: CORONARY STENT INTERVENTION: CATH118234

## 2018-03-22 HISTORY — PX: LEFT HEART CATH AND CORONARY ANGIOGRAPHY: CATH118249

## 2018-03-22 LAB — BASIC METABOLIC PANEL
Anion gap: 6 (ref 5–15)
BUN: 32 mg/dL — AB (ref 6–20)
CALCIUM: 8.8 mg/dL — AB (ref 8.9–10.3)
CO2: 23 mmol/L (ref 22–32)
Chloride: 110 mmol/L (ref 98–111)
Creatinine, Ser: 1.01 mg/dL — ABNORMAL HIGH (ref 0.44–1.00)
GFR calc Af Amer: >60 mL/min (ref >60)
GFR calc non Af Amer: 60 mL/min — ABNORMAL LOW (ref >60)
GLUCOSE: 155 mg/dL — AB (ref 70–99)
Potassium: 4.3 mmol/L (ref 3.5–5.1)
Sodium: 139 mmol/L (ref 135–145)

## 2018-03-22 LAB — POCT ACTIVATED CLOTTING TIME
ACTIVATED CLOTTING TIME: 246 s
ACTIVATED CLOTTING TIME: 252 s
Activated Clotting Time: 494 seconds

## 2018-03-22 LAB — CBC
HEMATOCRIT: 33.7 % — AB (ref 36.0–46.0)
Hemoglobin: 10.6 g/dL — ABNORMAL LOW (ref 12.0–15.0)
MCH: 28.9 pg (ref 26.0–34.0)
MCHC: 31.5 g/dL (ref 30.0–36.0)
MCV: 91.8 fL (ref 78.0–100.0)
Platelets: 175 10*3/uL (ref 150–400)
RBC: 3.67 MIL/uL — ABNORMAL LOW (ref 3.87–5.11)
RDW: 14 % (ref 11.5–15.5)
WBC: 5.5 10*3/uL (ref 4.0–10.5)

## 2018-03-22 LAB — HEPARIN LEVEL (UNFRACTIONATED): HEPARIN UNFRACTIONATED: 0.26 [IU]/mL — AB (ref 0.30–0.70)

## 2018-03-22 LAB — GLUCOSE, CAPILLARY: GLUCOSE-CAPILLARY: 234 mg/dL — AB (ref 70–99)

## 2018-03-22 SURGERY — LEFT HEART CATH AND CORONARY ANGIOGRAPHY
Anesthesia: LOCAL

## 2018-03-22 MED ORDER — HEPARIN (PORCINE) IN NACL 1000-0.9 UT/500ML-% IV SOLN
INTRAVENOUS | Status: DC | PRN
  Administered 2018-03-22: 500 mL

## 2018-03-22 MED ORDER — ANGIOPLASTY BOOK
Freq: Once | Status: AC
  Administered 2018-03-22: 22:00:00
  Filled 2018-03-22: qty 1

## 2018-03-22 MED ORDER — HEPARIN SODIUM (PORCINE) 1000 UNIT/ML IJ SOLN
INTRAMUSCULAR | Status: AC
  Filled 2018-03-22: qty 1

## 2018-03-22 MED ORDER — IOPAMIDOL (ISOVUE-370) INJECTION 76%
INTRAVENOUS | Status: AC
  Filled 2018-03-22: qty 100

## 2018-03-22 MED ORDER — MIDAZOLAM HCL 2 MG/2ML IJ SOLN
INTRAMUSCULAR | Status: AC
  Filled 2018-03-22: qty 2

## 2018-03-22 MED ORDER — HYDRALAZINE HCL 20 MG/ML IJ SOLN: 5.0000 mg | INTRAMUSCULAR | Status: AC | PRN

## 2018-03-22 MED ORDER — SODIUM CHLORIDE 0.9 % IV SOLN
INTRAVENOUS | Status: AC
  Administered 2018-03-22: 11:00:00 via INTRAVENOUS

## 2018-03-22 MED ORDER — HEPARIN SODIUM (PORCINE) 1000 UNIT/ML IJ SOLN
INTRAMUSCULAR | Status: DC | PRN
  Administered 2018-03-22: 6000 [IU] via INTRAVENOUS
  Administered 2018-03-22: 3000 [IU] via INTRAVENOUS
  Administered 2018-03-22: 6000 [IU] via INTRAVENOUS

## 2018-03-22 MED ORDER — SODIUM CHLORIDE 0.9% FLUSH: 3.0000 mL | INTRAVENOUS | Status: DC | PRN

## 2018-03-22 MED ORDER — LABETALOL HCL 5 MG/ML IV SOLN: 10.0000 mg | INTRAVENOUS | Status: AC | PRN

## 2018-03-22 MED ORDER — CLOPIDOGREL BISULFATE 300 MG PO TABS
ORAL_TABLET | ORAL | Status: AC
  Filled 2018-03-22: qty 1

## 2018-03-22 MED ORDER — VERAPAMIL HCL 2.5 MG/ML IV SOLN
INTRAVENOUS | Status: DC | PRN
  Administered 2018-03-22: 10 mL via INTRA_ARTERIAL

## 2018-03-22 MED ORDER — NITROGLYCERIN 1 MG/10 ML FOR IR/CATH LAB
INTRA_ARTERIAL | Status: DC | PRN
  Administered 2018-03-22: 200 ug

## 2018-03-22 MED ORDER — NITROGLYCERIN 1 MG/10 ML FOR IR/CATH LAB
INTRA_ARTERIAL | Status: AC
  Filled 2018-03-22: qty 10

## 2018-03-22 MED ORDER — LIDOCAINE HCL (PF) 1 % IJ SOLN
INTRAMUSCULAR | Status: AC
  Filled 2018-03-22: qty 30

## 2018-03-22 MED ORDER — THE SENSUOUS HEART BOOK
Freq: Once | Status: AC
  Administered 2018-03-22: 22:00:00
  Filled 2018-03-22: qty 1

## 2018-03-22 MED ORDER — LIDOCAINE HCL (PF) 1 % IJ SOLN
INTRAMUSCULAR | Status: DC | PRN
  Administered 2018-03-22: 2 mL

## 2018-03-22 MED ORDER — SODIUM CHLORIDE 0.9% FLUSH
3.0000 mL | Freq: Two times a day (BID) | INTRAVENOUS | Status: DC
  Administered 2018-03-22 (×2): 3 mL via INTRAVENOUS

## 2018-03-22 MED ORDER — IOPAMIDOL (ISOVUE-370) INJECTION 76%
INTRAVENOUS | Status: DC | PRN
  Administered 2018-03-22: 180 mL via INTRA_ARTERIAL

## 2018-03-22 MED ORDER — HEPARIN (PORCINE) IN NACL 1000-0.9 UT/500ML-% IV SOLN
INTRAVENOUS | Status: AC
  Filled 2018-03-22: qty 1000

## 2018-03-22 MED ORDER — CLOPIDOGREL BISULFATE 300 MG PO TABS
ORAL_TABLET | ORAL | Status: DC | PRN
  Administered 2018-03-22: 600 mg via ORAL

## 2018-03-22 MED ORDER — SODIUM CHLORIDE 0.9 % IV SOLN: 250.0000 mL | INTRAVENOUS | Status: DC | PRN

## 2018-03-22 MED ORDER — CLOPIDOGREL BISULFATE 75 MG PO TABS
75.0000 mg | ORAL_TABLET | Freq: Every day | ORAL | Status: DC
  Administered 2018-03-23: 75 mg via ORAL
  Filled 2018-03-22: qty 1

## 2018-03-22 MED ORDER — VERAPAMIL HCL 2.5 MG/ML IV SOLN
INTRAVENOUS | Status: AC
  Filled 2018-03-22: qty 2

## 2018-03-22 MED ORDER — MIDAZOLAM HCL 2 MG/2ML IJ SOLN
INTRAMUSCULAR | Status: DC | PRN
  Administered 2018-03-22 (×2): 2 mg via INTRAVENOUS

## 2018-03-22 SURGICAL SUPPLY — 19 items
BALLN SAPPHIRE 2.5X12 (BALLOONS) ×2
BALLN ~~LOC~~ EMERGE MR 3.0X12 (BALLOONS) ×2
BALLOON SAPPHIRE 2.5X12 (BALLOONS) ×1 IMPLANT
BALLOON ~~LOC~~ EMERGE MR 3.0X12 (BALLOONS) ×1 IMPLANT
CATH 5FR JL3.5 JR4 ANG PIG MP (CATHETERS) ×2 IMPLANT
CATH VISTA GUIDE 6FR XBLAD3.5 (CATHETERS) ×2 IMPLANT
DEVICE RAD COMP TR BAND LRG (VASCULAR PRODUCTS) ×2 IMPLANT
GLIDESHEATH SLEND SS 6F .021 (SHEATH) ×2 IMPLANT
GUIDEWIRE INQWIRE 1.5J.035X260 (WIRE) ×1 IMPLANT
INQWIRE 1.5J .035X260CM (WIRE) ×2
KIT ENCORE 26 ADVANTAGE (KITS) ×2 IMPLANT
KIT HEART LEFT (KITS) ×2 IMPLANT
PACK CARDIAC CATHETERIZATION (CUSTOM PROCEDURE TRAY) ×2 IMPLANT
STENT SIERRA 3.00 X 18 MM (Permanent Stent) ×2 IMPLANT
SYR MEDRAD MARK V 150ML (SYRINGE) ×2 IMPLANT
TRANSDUCER W/STOPCOCK (MISCELLANEOUS) ×2 IMPLANT
TUBING CIL FLEX 10 FLL-RA (TUBING) ×2 IMPLANT
WIRE COUGAR XT STRL 190CM (WIRE) ×2 IMPLANT
WIRE HI TORQ VERSACORE-J 145CM (WIRE) ×2 IMPLANT

## 2018-03-22 NOTE — Progress Notes (Signed)
Progress Note  Patient Name: Jodi Ray Date of Encounter: 03/22/2018  Primary Cardiologist: Kate Sable, MD   Subjective   No complaints today. Denies any more chest pain or SOB.  Inpatient Medications    Scheduled Meds: . [START ON 03/23/2018] aspirin EC  81 mg Oral Daily  . atorvastatin  20 mg Oral q1800  . [START ON 03/23/2018] cholecalciferol  5,000 Units Oral Q3 days  . dicyclomine  10 mg Oral TID AC & HS  . gabapentin  400 mg Oral Daily   And  . gabapentin  800 mg Oral QHS  . hydrochlorothiazide  12.5 mg Oral Daily  . insulin pump   Subcutaneous TID AC, HS, 0200  . levothyroxine  125 mcg Oral QAC breakfast  . lisinopril  10 mg Oral Daily  . loratadine  10 mg Oral Daily  . metFORMIN  500 mg Oral BID WC  . multivitamin with minerals  1 tablet Oral Daily  . sodium chloride flush  3 mL Intravenous Q12H  . topiramate  50 mg Oral QHS   Continuous Infusions: . sodium chloride    . sodium chloride 1 mL/kg/hr (03/22/18 0504)  . heparin 1,300 Units/hr (03/22/18 0600)   PRN Meds: sodium chloride, acetaminophen, artificial tears, ondansetron (ZOFRAN) IV, polyvinyl alcohol, sodium chloride flush   Vital Signs    Vitals:   03/21/18 0520 03/21/18 1519 03/21/18 1916 03/22/18 0335  BP: 110/61 132/69 138/90 117/63  Pulse: 75 80 85 79  Resp: 20 18 14 11   Temp: 97.6 F (36.4 C) 98.1 F (36.7 C) 97.8 F (36.6 C) 98.6 F (37 C)  TempSrc: Oral  Oral Oral  SpO2: 97% 97% 98% 95%  Weight:    117.1 kg  Height:        Intake/Output Summary (Last 24 hours) at 03/22/2018 0736 Last data filed at 03/22/2018 0600 Gross per 24 hour  Intake 1273.37 ml  Output -  Net 1273.37 ml   Filed Weights   03/20/18 1445 03/22/18 0335  Weight: 112.9 kg 117.1 kg    Telemetry    NSR - Personally Reviewed  ECG    NSR with LBBB chronic - Personally Reviewed  Physical Exam    GEN: No acute distress.   Neck: No JVD Cardiac: RRR, no murmurs, rubs, or gallops.    Respiratory: Clear to auscultation bilaterally. GI: Soft, nontender, non-distended  MS: No edema; No deformity. Neuro:  Nonfocal  Psych: Normal affect   Labs    Chemistry Recent Labs  Lab 03/20/18 1613 03/22/18 0325  NA 141 139  K 4.5 4.3  CL 109 110  CO2 24 23  GLUCOSE 162* 155*  BUN 28* 32*  CREATININE 1.04* 1.01*  CALCIUM 9.3 8.8*  GFRNONAA 58* 60*  GFRAA >60 >60  ANIONGAP 8 6     Hematology Recent Labs  Lab 03/20/18 1613 03/22/18 0325  WBC 5.1 5.5  RBC 3.87 3.67*  HGB 11.3* 10.6*  HCT 35.0* 33.7*  MCV 90.4 91.8  MCH 29.2 28.9  MCHC 32.3 31.5  RDW 14.2 14.0  PLT 206 175    Cardiac Enzymes Recent Labs  Lab 03/20/18 1613 03/20/18 1925 03/20/18 2217 03/21/18 0055  TROPONINI <0.03 <0.03 <0.03 <0.03   No results for input(s): TROPIPOC in the last 168 hours.   BNPNo results for input(s): BNP, PROBNP in the last 168 hours.   DDimer No results for input(s): DDIMER in the last 168 hours.   Radiology    Dg Chest 2 View  Result Date: 03/20/2018 CLINICAL DATA:  Chest pain extending to LEFT shoulder, shortness of breath, diaphoresis EXAM: CHEST - 2 VIEW COMPARISON:  07/21/2017 FINDINGS: Normal heart size, mediastinal contours, and pulmonary vascularity. Mild chronic bronchitic changes. No acute infiltrate, pleural effusion or pneumothorax. Intraspinal stimulator noted. No acute osseous findings. IMPRESSION: Mild chronic bronchitic changes without infiltrate. Electronically Signed   By: Lavonia Dana M.D.   On: 03/20/2018 17:45    Cardiac Studies   NST: 11/21/2016  Left bundle branch block present throughout.  Moderate sized, moderate intensity, mid to apical anteroseptal defect that is partially reversible. Significant degree of breast attenuation affects the study and may contribute to this defect, however cannot exclude a moderate degree of ischemia.  This is an intermediate risk study.  Nuclear stress EF: 73%.  Patient Profile     57 y.o. female  with history of Obesity with OSA, DM, HTN, and chronic LBBB. Abnormal Myoview last year. Now with new onset angina.  Assessment & Plan    1. Acute chest pain concerning for USAP. Prior abnormal Myoview in April 2018 but was asymptomatic at that time. She has ruled out for MI. Has chronic LBBB. Multiple cardiac risk factors. Will proceed with cardiac cath today. 2. LBBB 3. DM on insulin pump. 4. HLD. On lipitor 20 mg daily. If she has CAD will need high dose statin 5. HTN controlled. 6. Obesity with OSA.     For questions or updates, please contact [place] Please consult [email or web address] for contact info under Cardiology/STEMI.      Signed, Analeya Luallen [place], MD  03/22/2018, 7:36 AM

## 2018-03-22 NOTE — Progress Notes (Signed)
Inpatient Diabetes Program Recommendations  AACE/ADA: New Consensus Statement on Inpatient Glycemic Control (2015)  Target Ranges:  Prepandial:   less than 140 mg/dL      Peak postprandial:   less than 180 mg/dL (1-2 hours)      Critically ill patients:  140 - 180 mg/dL   Lab Results  Component Value Date   GLUCAP 96 03/21/2018   HGBA1C 7.8 (H) 03/20/2018    Review of Glycemic Control  Diabetes history: DM1 Outpatient Diabetes medications: Medtronic insulin pump + Metformin 500 mg bid Current orders for Inpatient glycemic control: Insulin Pump  Inpatient Diabetes Program Recommendations:    Noted patient currently in cath lab. Reviewed insulin pump policy with RN Vincente Liberty and Vincente Liberty plans to assure patient has written contract and flowsheet and checking CBGs with hospital meter as contract states. Patient was aware of need to use hospital meter when DM coordinator spoke with her yesterday. Will follow.  Thank you, Nani Gasser. Gitty Osterlund, RN, MSN, CDE  Diabetes Coordinator Inpatient Glycemic Control Team Team Pager [phone] (8am-5pm) 03/22/2018 11:28 AM

## 2018-03-22 NOTE — Progress Notes (Signed)
ANTICOAGULATION CONSULT NOTE - [place] for heparin Indication: chest pain/ACS  Allergies  Allergen Reactions  . Fentanyl Nausea And Vomiting and Other (See Comments)    Nausea, vomiting, loss of consciousness, requiring reversal    Patient Measurements: Height: 5\' 9"  (175.3 cm) Weight: 258 lb 2.5 oz (117.1 kg) IBW/kg (Calculated) : 66.2 Heparin Dosing Weight: 91.8kg   Vital Signs: Temp: 98.6 F (37 C) (08/23 0335) Temp Source: Oral (08/23 0335) BP: 117/63 (08/23 0335) Pulse Rate: 79 (08/23 0335)  Labs: Recent Labs    03/20/18 1613 03/20/18 1925 03/20/18 2217 03/21/18 0055 03/21/18 1202 03/21/18 1814 03/22/18 0325  HGB 11.3*  --   --   --   --   --  10.6*  HCT 35.0*  --   --   --   --   --  33.7*  PLT 206  --   --   --   --   --  175  APTT  --   --   --   --  28  --   --   HEPARINUNFRC  --   --   --   --  <0.10* 0.39 0.26*  CREATININE 1.04*  --   --   --   --   --   --   TROPONINI <0.03 <0.03 <0.03 <0.03  --   --   --     Estimated Creatinine Clearance: 79.6 mL/min (A) (by C-G formula based on SCr of 1.04 mg/dL (H)).   Medical History: Past Medical History:  Diagnosis Date  . Adult RDS ([place])   . Anemia   . Brain aneurysm    frontal lobe  . Chronic pain   . Diabetes mellitus without complication ([place])   . Headache   . History of left bundle branch block (LBBB)   . HTN (hypertension)   . Hx of cardiovascular stress test 10/2016   intermediate risk study  . Hypothyroidism   . Neuromuscular disorder ([place])   . Neuropathy   . Obesity   . OSA on CPAP 03/10/2015  . Retinopathy   . Sleep apnea   . Varicose veins of both lower extremities       Assessment:   26 yoF with CP to start on IV heparin. HL subtherapeutic this AM. H/H and Plt down slightly. No bleeding per RN. Cath planned at 0900 today.   Goal of Therapy:  Heparin level 0.3-0.7 units/ml Monitor platelets by anticoagulation protocol: Yes   Plan:  -Increase IV  heparin to 1300 units/hr -F/u after cath  Albertina Parr, PharmD., BCPS Clinical Pharmacist Clinical phone for 03/22/18 until 8AM: [phone]

## 2018-03-22 NOTE — Progress Notes (Signed)
PROGRESS NOTE    Jodi Ray  ATF:[id number] DOB: 05/03/1961 DOA: 03/20/2018 PCP: Monico Blitz, MD    Brief Narrative:  57 year old female with a history of coronary artery disease with abnormal stress test in the past, hypertension, insulin-dependent diabetes on insulin pump, chronic pain syndrome status post spinal stimulator, admitted to the hospital with typical chest pain concerning for unstable angina.  Cardiac enzymes have thus far been negative.  Seen by cardiology and plan is for transfer to [facility] for cardiac catheterization.  She is status post cardiac cath and LAD PCI 8/23.   Assessment & Plan:   Principal Problem:   Chest pain Active Problems:   Type I diabetes mellitus, uncontrolled ([place])   Mixed hyperlipidemia   Primary hypothyroidism   Benign hypertension   Unstable angina ([place])   1. Unstable angina:.  Patient had typical symptoms of cardiac chest pain.  Cardiac enzymes were negative.  EKG showed no acute changes.  Cardiology was consulted at [facility] and recommended cardiac cath for which she was transferred to [facility].  She was on IV heparin drip prior to cardiac cath.  She is status post cardiac cath and PCI to LAD on 8/23.  Continue aspirin, Plavix and statins.  Management per cardiology.  Monitor overnight and possible discharge home in a.m.  2. Normocytic anemia: Follow CBCs. 3. Insulin dependent diabetes.  Continue on insulin pump.  CBGs are well controlled.  Hold metformin due to recent cath. 4. Hypothyroidism.  Continue on Synthroid 5. Hyperlipidemia.  Continue statin 6. Hypertension.  Blood pressure stable.  Continue on hydrochlorothiazide and lisinopril. 7. History of migraines.  Continue on Topamax. 8. Chronic LBBB:    DVT prophylaxis: heparin subcutaneous Code Status: full code Family Communication: None at bedside this morning Disposition Plan: Pending clinical improvement and clearance by cardiology, possibly  8/24   Consultants:   Cardiology  Procedures:   Left heart cath and coronary angiography with coronary stent intervention   Conclusion   Prox RCA lesion is 20% stenosed.  Prox LAD lesion is 80% stenosed.  A drug-eluting stent was successfully placed using a STENT SIERRA 3.00 X 18 MM.  Post intervention, there is a 0% residual stenosis.  The left ventricular systolic function is normal.  LV end diastolic pressure is normal.  The left ventricular ejection fraction is 55-65% by visual estimate.  There is no mitral valve regurgitation.   1. Severe stenosis proximal LAD 2. Successful PTCA/DES x 1 proximal LAD 3. Mild non-obstructive disease in the RCA\ 4. Normal LV systolic function  Recommendations: Continue statin and beta blocker.  Recommend uninterrupted dual antiplatelet therapy with Aspirin 81mg  daily and Clopidogrel 75mg  daily for a minimum of 6 months (stable ischemic heart disease - Class I recommendation). I would complete one year of ASA and Plavix if she tolerates well.   Antimicrobials:   None   Subjective: Seen this morning.  No recurrence of chest pain since arrival to [facility].  Denies any other complaints.  Patient was seen this morning prior to procedure.  Objective: Vitals:   03/22/18 1345 03/22/18 1400 03/22/18 1431 03/22/18 1500  BP: (!) 133/52 (!) 130/57 114/61 (!) 132/52  Pulse: 78 79 82 79  Resp: 17 10 (!) 9 13  Temp:      TempSrc:      SpO2: 97% 97% 100% 98%  Weight:      Height:        Intake/Output Summary (Last 24 hours) at  03/22/2018 1633 Last data filed at 03/22/2018 0600 Gross per 24 hour  Intake 793.17 ml  Output -  Net 793.17 ml   Filed Weights   03/20/18 1445 03/22/18 0335  Weight: 112.9 kg 117.1 kg    Examination:  General exam: Pleasant middle-aged female, moderately built and obese sitting up comfortably in bed. Respiratory system: Clear to auscultation. Respiratory effort normal.   Stable. Cardiovascular system: S1 & S2 heard, RRR. No JVD, murmurs, rubs, gallops or clicks.  Trace bilateral ankle edema. Gastrointestinal system: Abdomen is nondistended, soft and nontender. No organomegaly or masses felt. Normal bowel sounds heard.  Stable. Central nervous system: Alert and oriented. No focal neurological deficits.  Stable. Extremities: Symmetric 5 x 5 power.  Stable. Skin: No rashes, lesions or ulcers Psychiatry: Judgement and insight appear normal. Mood & affect appropriate.     Data Reviewed: I have personally reviewed following labs and imaging studies  CBC: Recent Labs  Lab 03/20/18 1613 03/22/18 0325  WBC 5.1 5.5  NEUTROABS 3.0  --   HGB 11.3* 10.6*  HCT 35.0* 33.7*  MCV 90.4 91.8  PLT 206 160   Basic Metabolic Panel: Recent Labs  Lab 03/20/18 1613 03/22/18 0325  NA 141 139  K 4.5 4.3  CL 109 110  CO2 24 23  GLUCOSE 162* 155*  BUN 28* 32*  CREATININE 1.04* 1.01*  CALCIUM 9.3 8.8*   GFR: Estimated Creatinine Clearance: 82 mL/min (A) (by C-G formula based on SCr of 1.01 mg/dL (H)).  Cardiac Enzymes: Recent Labs  Lab 03/20/18 1613 03/20/18 1925 03/20/18 2217 03/21/18 0055  TROPONINI <0.03 <0.03 <0.03 <0.03   HbA1C: Recent Labs    03/20/18 1613  HGBA1C 7.8*   CBG: Recent Labs  Lab 03/21/18 0743 03/21/18 1128 03/21/18 1704  GLUCAP 86 156* 96       Radiology Studies: Dg Chest 2 View  Result Date: 03/20/2018 CLINICAL DATA:  Chest pain extending to LEFT shoulder, shortness of breath, diaphoresis EXAM: CHEST - 2 VIEW COMPARISON:  07/21/2017 FINDINGS: Normal heart size, mediastinal contours, and pulmonary vascularity. Mild chronic bronchitic changes. No acute infiltrate, pleural effusion or pneumothorax. Intraspinal stimulator noted. No acute osseous findings. IMPRESSION: Mild chronic bronchitic changes without infiltrate. Electronically Signed   By: Lavonia Dana M.D.   On: 03/20/2018 17:45        Scheduled Meds: . [START  ON 03/23/2018] aspirin EC  81 mg Oral Daily  . atorvastatin  20 mg Oral q1800  . [START ON 03/23/2018] cholecalciferol  5,000 Units Oral Q3 days  . [START ON 03/23/2018] clopidogrel  75 mg Oral Q breakfast  . dicyclomine  10 mg Oral TID AC & HS  . gabapentin  400 mg Oral Daily   And  . gabapentin  800 mg Oral QHS  . hydrochlorothiazide  12.5 mg Oral Daily  . insulin pump   Subcutaneous TID AC, HS, 0200  . levothyroxine  125 mcg Oral QAC breakfast  . lisinopril  10 mg Oral Daily  . loratadine  10 mg Oral Daily  . metFORMIN  500 mg Oral BID WC  . multivitamin with minerals  1 tablet Oral Daily  . sodium chloride flush  3 mL Intravenous Q12H  . topiramate  50 mg Oral QHS   Continuous Infusions: . sodium chloride 75 mL/hr at 03/22/18 1057  . sodium chloride       LOS: 1 day    Time spent: 57mins   Vernell Leep, MD, FACP, [facility]. [facility] Pager  [phone]  If 7PM-7AM, please contact night-coverage [email or web address] Password [facility] 03/22/2018, 4:39 PM

## 2018-03-22 NOTE — Progress Notes (Signed)
[facility] BAND REMOVAL  LOCATION:    right radial  DEFLATED PER PROTOCOL:    Yes.    TIME BAND OFF / DRESSING APPLIED:    1515   SITE UPON ARRIVAL:    Level 0  SITE AFTER BAND REMOVAL:    Level 0  CIRCULATION SENSATION AND MOVEMENT:    Within Normal Limits   Yes.    COMMENTS:   TR Band removed and dressing applied C/D/I, no bleeding or hematoma noted. Soft to touch. Post removal instruction provided and teach back effective.

## 2018-03-22 NOTE — Interval H&P Note (Signed)
History and Physical Interval Note:  03/22/2018 8:48 AM  Jodi Ray  has presented today for cardiac cath with the diagnosis of chest pain, abnormal stress test. The various methods of treatment have been discussed with the patient and family. After consideration of risks, benefits and other options for treatment, the patient has consented to  Procedure(s): LEFT HEART CATH AND CORONARY ANGIOGRAPHY (N/A) as a surgical intervention .  The patient's history has been reviewed, patient examined, no change in status, stable for surgery.  I have reviewed the patient's chart and labs.  Questions were answered to the patient's satisfaction.    Cath Lab Visit (complete for each Cath Lab visit)  Clinical Evaluation Leading to the Procedure:   ACS: No.  Non-ACS:    Anginal Classification: CCS II  Anti-ischemic medical therapy: No Therapy  Non-Invasive Test Results: Intermediate-risk stress test findings: cardiac mortality 1-3%/year  Prior CABG: No previous CABG         Lauree Chandler

## 2018-03-22 NOTE — H&P (View-Only) (Signed)
Progress Note  Patient Name: Jodi Ray Date of Encounter: 03/22/2018  Primary Cardiologist: Kate Sable, MD   Subjective   No complaints today. Denies any more chest pain or SOB.  Inpatient Medications    Scheduled Meds: . [START ON 03/23/2018] aspirin EC  81 mg Oral Daily  . atorvastatin  20 mg Oral q1800  . [START ON 03/23/2018] cholecalciferol  5,000 Units Oral Q3 days  . dicyclomine  10 mg Oral TID AC & HS  . gabapentin  400 mg Oral Daily   And  . gabapentin  800 mg Oral QHS  . hydrochlorothiazide  12.5 mg Oral Daily  . insulin pump   Subcutaneous TID AC, HS, 0200  . levothyroxine  125 mcg Oral QAC breakfast  . lisinopril  10 mg Oral Daily  . loratadine  10 mg Oral Daily  . metFORMIN  500 mg Oral BID WC  . multivitamin with minerals  1 tablet Oral Daily  . sodium chloride flush  3 mL Intravenous Q12H  . topiramate  50 mg Oral QHS   Continuous Infusions: . sodium chloride    . sodium chloride 1 mL/kg/hr (03/22/18 0504)  . heparin 1,300 Units/hr (03/22/18 0600)   PRN Meds: sodium chloride, acetaminophen, artificial tears, ondansetron (ZOFRAN) IV, polyvinyl alcohol, sodium chloride flush   Vital Signs    Vitals:   03/21/18 0520 03/21/18 1519 03/21/18 1916 03/22/18 0335  BP: 110/61 132/69 138/90 117/63  Pulse: 75 80 85 79  Resp: 20 18 14 11   Temp: 97.6 F (36.4 C) 98.1 F (36.7 C) 97.8 F (36.6 C) 98.6 F (37 C)  TempSrc: Oral  Oral Oral  SpO2: 97% 97% 98% 95%  Weight:    117.1 kg  Height:        Intake/Output Summary (Last 24 hours) at 03/22/2018 0736 Last data filed at 03/22/2018 0600 Gross per 24 hour  Intake 1273.37 ml  Output -  Net 1273.37 ml   Filed Weights   03/20/18 1445 03/22/18 0335  Weight: 112.9 kg 117.1 kg    Telemetry    NSR - Personally Reviewed  ECG    NSR with LBBB chronic - Personally Reviewed  Physical Exam    GEN: No acute distress.   Neck: No JVD Cardiac: RRR, no murmurs, rubs, or gallops.    Respiratory: Clear to auscultation bilaterally. GI: Soft, nontender, non-distended  MS: No edema; No deformity. Neuro:  Nonfocal  Psych: Normal affect   Labs    Chemistry Recent Labs  Lab 03/20/18 1613 03/22/18 0325  NA 141 139  K 4.5 4.3  CL 109 110  CO2 24 23  GLUCOSE 162* 155*  BUN 28* 32*  CREATININE 1.04* 1.01*  CALCIUM 9.3 8.8*  GFRNONAA 58* 60*  GFRAA >60 >60  ANIONGAP 8 6     Hematology Recent Labs  Lab 03/20/18 1613 03/22/18 0325  WBC 5.1 5.5  RBC 3.87 3.67*  HGB 11.3* 10.6*  HCT 35.0* 33.7*  MCV 90.4 91.8  MCH 29.2 28.9  MCHC 32.3 31.5  RDW 14.2 14.0  PLT 206 175    Cardiac Enzymes Recent Labs  Lab 03/20/18 1613 03/20/18 1925 03/20/18 2217 03/21/18 0055  TROPONINI <0.03 <0.03 <0.03 <0.03   No results for input(s): TROPIPOC in the last 168 hours.   BNPNo results for input(s): BNP, PROBNP in the last 168 hours.   DDimer No results for input(s): DDIMER in the last 168 hours.   Radiology    Dg Chest 2 View  Result Date: 03/20/2018 CLINICAL DATA:  Chest pain extending to LEFT shoulder, shortness of breath, diaphoresis EXAM: CHEST - 2 VIEW COMPARISON:  07/21/2017 FINDINGS: Normal heart size, mediastinal contours, and pulmonary vascularity. Mild chronic bronchitic changes. No acute infiltrate, pleural effusion or pneumothorax. Intraspinal stimulator noted. No acute osseous findings. IMPRESSION: Mild chronic bronchitic changes without infiltrate. Electronically Signed   By: Lavonia Dana M.D.   On: 03/20/2018 17:45    Cardiac Studies   NST: 11/21/2016  Left bundle branch block present throughout.  Moderate sized, moderate intensity, mid to apical anteroseptal defect that is partially reversible. Significant degree of breast attenuation affects the study and may contribute to this defect, however cannot exclude a moderate degree of ischemia.  This is an intermediate risk study.  Nuclear stress EF: 73%.  Patient Profile     57 y.o. female  with history of Obesity with OSA, DM, HTN, and chronic LBBB. Abnormal Myoview last year. Now with new onset angina.  Assessment & Plan    1. Acute chest pain concerning for USAP. Prior abnormal Myoview in April 2018 but was asymptomatic at that time. She has ruled out for MI. Has chronic LBBB. Multiple cardiac risk factors. Will proceed with cardiac cath today. 2. LBBB 3. DM on insulin pump. 4. HLD. On lipitor 20 mg daily. If she has CAD will need high dose statin 5. HTN controlled. 6. Obesity with OSA.     For questions or updates, please contact [place] Please consult [email or web address] for contact info under Cardiology/STEMI.      Signed, Chick Cousins [place], MD  03/22/2018, 7:36 AM

## 2018-03-23 DIAGNOSIS — E1065 Type 1 diabetes mellitus with hyperglycemia: Secondary | ICD-10-CM

## 2018-03-23 LAB — CBC
HEMATOCRIT: 33.7 % — AB (ref 36.0–46.0)
HEMOGLOBIN: 10.5 g/dL — AB (ref 12.0–15.0)
MCH: 28.7 pg (ref 26.0–34.0)
MCHC: 31.2 g/dL (ref 30.0–36.0)
MCV: 92.1 fL (ref 78.0–100.0)
PLATELETS: 187 10*3/uL (ref 150–400)
RBC: 3.66 MIL/uL — AB (ref 3.87–5.11)
RDW: 14.1 % (ref 11.5–15.5)
WBC: 5.5 10*3/uL (ref 4.0–10.5)

## 2018-03-23 LAB — BASIC METABOLIC PANEL
ANION GAP: 6 (ref 5–15)
BUN: 30 mg/dL — ABNORMAL HIGH (ref 6–20)
CHLORIDE: 109 mmol/L (ref 98–111)
CO2: 23 mmol/L (ref 22–32)
Calcium: 9.1 mg/dL (ref 8.9–10.3)
Creatinine, Ser: 1.09 mg/dL — ABNORMAL HIGH (ref 0.44–1.00)
GFR calc non Af Amer: 54 mL/min — ABNORMAL LOW (ref >60)
Glucose, Bld: 204 mg/dL — ABNORMAL HIGH (ref 70–99)
POTASSIUM: 4.5 mmol/L (ref 3.5–5.1)
SODIUM: 138 mmol/L (ref 135–145)

## 2018-03-23 LAB — GLUCOSE, CAPILLARY: Glucose-Capillary: 124 mg/dL — ABNORMAL HIGH (ref 70–99)

## 2018-03-23 MED ORDER — ATORVASTATIN CALCIUM 80 MG PO TABS: 80.0000 mg | ORAL_TABLET | Freq: Every day | ORAL | 0 refills | Status: DC

## 2018-03-23 MED ORDER — ATORVASTATIN CALCIUM 80 MG PO TABS: 80.0000 mg | ORAL_TABLET | Freq: Every day | ORAL | Status: DC

## 2018-03-23 MED ORDER — METFORMIN HCL 500 MG PO TABS: 500.0000 mg | ORAL_TABLET | Freq: Two times a day (BID) | ORAL | Status: DC

## 2018-03-23 MED ORDER — CLOPIDOGREL BISULFATE 75 MG PO TABS: 75.0000 mg | ORAL_TABLET | Freq: Every day | ORAL | 0 refills | Status: DC

## 2018-03-23 MED ORDER — PANTOPRAZOLE SODIUM 40 MG PO TBEC: 40.0000 mg | DELAYED_RELEASE_TABLET | Freq: Two times a day (BID) | ORAL | 0 refills | Status: DC

## 2018-03-23 NOTE — Discharge Instructions (Addendum)
Do not lift anything heavier than 1/2 gallon of milk for 2 days.   Do not drive for 2 days.   Call cardiology office if any severe pain or swelling develops at your cath site    Additional Discharge Instructions  Please get your medications reviewed and adjusted by your Primary MD.  Please request your Primary MD to go over all Hospital Tests and Procedure/Radiological results at the follow up, please get all Hospital records sent to your Prim MD by signing hospital release before you go home.  If you had Pneumonia of Lung problems at the Hospital: Please get a 2 view Chest X ray done in 6-8 weeks after hospital discharge or sooner if instructed by your Primary MD.  If you have Congestive Heart Failure: Please call your Cardiologist or Primary MD anytime you have any of the following symptoms:  1) 3 pound weight gain in 24 hours or 5 pounds in 1 week  2) shortness of breath, with or without a dry hacking cough  3) swelling in the hands, feet or stomach  4) if you have to sleep on extra pillows at night in order to breathe  Follow cardiac low salt diet and 1.5 lit/day fluid restriction.  If you have diabetes Accuchecks 4 times/day, Once in AM empty stomach and then before each meal. Log in all results and show them to your primary doctor at your next visit. If any glucose reading is under 80 or above 300 call your primary MD immediately.  If you have Seizure/Convulsions/Epilepsy: Please do not drive, operate heavy machinery, participate in activities at heights or participate in high speed sports until you have seen by Primary MD or a Neurologist and advised to do so again.  If you had Gastrointestinal Bleeding: Please ask your Primary MD to check a complete blood count within one week of discharge or at your next visit. Your endoscopic/colonoscopic biopsies that are pending at the time of discharge, will also need to followed by your Primary MD.  Get Medicines reviewed and  adjusted. Please take all your medications with you for your next visit with your Primary MD  Please request your Primary MD to go over all hospital tests and procedure/radiological results at the follow up, please ask your Primary MD to get all Hospital records sent to his/her office.  If you experience worsening of your admission symptoms, develop shortness of breath, life threatening emergency, suicidal or homicidal thoughts you must seek medical attention immediately by calling 911 or calling your MD immediately  if symptoms less severe.  You must read complete instructions/literature along with all the possible adverse reactions/side effects for all the Medicines you take and that have been prescribed to you. Take any new Medicines after you have completely understood and accpet all the possible adverse reactions/side effects.   Do not drive or operate heavy machinery when taking Pain medications.   Do not take more than prescribed Pain, Sleep and Anxiety Medications  Special Instructions: If you have smoked or chewed Tobacco  in the last 2 yrs please stop smoking, stop any regular Alcohol  and or any Recreational drug use.  Wear Seat belts while driving.  Please note You were cared for by a hospitalist during your hospital stay. If you have any questions about your discharge medications or the care you received while you were in the hospital after you are discharged, you can call the unit and asked to speak with the hospitalist on call if the hospitalist  that took care of you is not available. Once you are discharged, your primary care physician will handle any further medical issues. Please note that NO REFILLS for any discharge medications will be authorized once you are discharged, as it is imperative that you return to your primary care physician (or establish a relationship with a primary care physician if you do not have one) for your aftercare needs so that they can reassess your need  for medications and monitor your lab values.  You can reach the hospitalist office at phone [phone] or fax [phone]   If you do not have a primary care physician, you can call [phone] for a physician referral.

## 2018-03-23 NOTE — Progress Notes (Signed)
CARDIAC REHAB PHASE I   PRE:  Rate/Rhythm: 85 sinus rhythm, LBBB  BP:  Supine:   Sitting: 140/56  Standing:    SaO2: 97% ra  MODE:  Ambulation: 650 ft   POST:  Rate/Rhythem: 119 SR, LBBB  BP:  Supine:   Sitting: 130/84  Standing:    SaO2: 96% ra  0820-900   Pt ambulated in hallway x1 assist.  Steady gait, asymptomatic.  Pt returned to bed call light in reach. Education completed including risk factor modification, low fat-low cholesterol diet, exercise, and medication compliance.  Pt oriented to outpatient cardiac rehab.  At pt request, referral will be sent to [facility].  Understanding verbalized    [employer]

## 2018-03-23 NOTE — Progress Notes (Signed)
Progress Note  Patient Name: Jodi Ray Date of Encounter: 03/23/2018  Primary Cardiologist: Kate Sable, MD   Subjective   No complaints  Inpatient Medications    Scheduled Meds: . aspirin EC  81 mg Oral Daily  . atorvastatin  20 mg Oral q1800  . cholecalciferol  5,000 Units Oral Q3 days  . clopidogrel  75 mg Oral Q breakfast  . dicyclomine  10 mg Oral TID AC & HS  . gabapentin  400 mg Oral Daily   And  . gabapentin  800 mg Oral QHS  . hydrochlorothiazide  12.5 mg Oral Daily  . insulin pump   Subcutaneous TID AC, HS, 0200  . levothyroxine  125 mcg Oral QAC breakfast  . lisinopril  10 mg Oral Daily  . loratadine  10 mg Oral Daily  . multivitamin with minerals  1 tablet Oral Daily  . sodium chloride flush  3 mL Intravenous Q12H  . topiramate  50 mg Oral QHS   Continuous Infusions: . sodium chloride     PRN Meds: sodium chloride, acetaminophen, artificial tears, ondansetron (ZOFRAN) IV, polyvinyl alcohol, sodium chloride flush   Vital Signs    Vitals:   03/23/18 0310 03/23/18 0400 03/23/18 0500 03/23/18 0600  BP: 132/60 (!) 122/48 (!) 138/59 (!) 104/43  Pulse: 84 77 79 73  Resp: (!) 23 16 17 17   Temp: 97.8 F (36.6 C)     TempSrc: Oral     SpO2: 97% 95% 97% 92%  Weight: 111.2 kg     Height:        Intake/Output Summary (Last 24 hours) at 03/23/2018 0721 Last data filed at 03/22/2018 1800 Gross per 24 hour  Intake 475 ml  Output -  Net 475 ml   Filed Weights   03/20/18 1445 03/22/18 0335 03/23/18 0310  Weight: 112.9 kg 117.1 kg 111.2 kg    Telemetry    SR LBBB - Personally Reviewed  ECG    na  Physical Exam   GEN: No acute distress.   Neck: No JVD Cardiac: RRR, no murmurs, rubs, or gallops.  Respiratory: Clear to auscultation bilaterally. GI: Soft, nontender, non-distended  MS: trace bilateral edema; No deformity. Neuro:  Nonfocal  Psych: Normal affect   Labs    Chemistry Recent Labs  Lab 03/20/18 1613 03/22/18 0325  03/23/18 0240  NA 141 139 138  K 4.5 4.3 4.5  CL 109 110 109  CO2 24 23 23   GLUCOSE 162* 155* 204*  BUN 28* 32* 30*  CREATININE 1.04* 1.01* 1.09*  CALCIUM 9.3 8.8* 9.1  GFRNONAA 58* 60* 54*  GFRAA >60 >60 >60  ANIONGAP 8 6 6      Hematology Recent Labs  Lab 03/20/18 1613 03/22/18 0325 03/23/18 0240  WBC 5.1 5.5 5.5  RBC 3.87 3.67* 3.66*  HGB 11.3* 10.6* 10.5*  HCT 35.0* 33.7* 33.7*  MCV 90.4 91.8 92.1  MCH 29.2 28.9 28.7  MCHC 32.3 31.5 31.2  RDW 14.2 14.0 14.1  PLT 206 175 187    Cardiac Enzymes Recent Labs  Lab 03/20/18 1613 03/20/18 1925 03/20/18 2217 03/21/18 0055  TROPONINI <0.03 <0.03 <0.03 <0.03   No results for input(s): TROPIPOC in the last 168 hours.   BNPNo results for input(s): BNP, PROBNP in the last 168 hours.   DDimer No results for input(s): DDIMER in the last 168 hours.   Radiology    No results found.  Cardiac Studies    Patient Profile     57  y.o. female Jodi Ray is a 57 y.o. female with past medical history of HTN, HLD, IDDM, and known LBBB who is being seen today for the evaluation of chest pain at the request of Dr. Shanon Brow.   Assessment & Plan    1. CAD/Unstable angina - admitted with unstable angina. Cath showed 80% prox LAD, s/p PCI. LVEF 55-65% by LVgram  - medical therapy with ASA 81, atorva 20, plavix 75, lisinopril 10. CHange to high dose statin atorva 80.  - post cath labs are stable.   [place] for discharge today from cardiac standpoint, we will arrange outpatient follow up.    For questions or updates, please contact [place] Please consult [email or web address] for contact info under Cardiology/STEMI.      Merrily Pew, MD  03/23/2018, 7:21 AM

## 2018-03-23 NOTE — Discharge Summary (Signed)
Physician Discharge Summary  Jodi Ray OVZ:[id number] DOB: 10-28-1960  PCP: Monico Blitz, MD  Admit date: 03/20/2018 Discharge date: 03/23/2018  Recommendations for Outpatient Follow-up:  1. Dr. Monico Blitz, PCP in 1 week with repeat labs (CBC & BMP). 2. Dr. Kate Sable, Cardiology: Office will call with a hospital follow-up visit in 3 weeks. 3. Dr. Jacelyn Pi, Endocrinology  Home Health: None Equipment/Devices: None  Discharge Condition: Improved and stable CODE STATUS: Full Diet recommendation: Heart healthy & diabetic diet.  Discharge Diagnoses:  Principal Problem:   Chest pain Active Problems:   Type I diabetes mellitus, uncontrolled (HCC)   Mixed hyperlipidemia   Primary hypothyroidism   Benign hypertension   Unstable angina [facility])   Brief Summary: 57 year old female with a history of coronary artery disease with abnormal stress test in the past, hypertension, insulin-dependent diabetes on insulin pump, chronic pain syndrome status post spinal stimulator, admitted to the hospital with typical chest pain concerning for unstable angina.  Cardiac enzymes have thus far been negative.  Seen by cardiology and plan is for transfer to [facility] for cardiac catheterization.  She is status post cardiac cath and LAD PCI 8/23.   Assessment & Plan:   1. CAD/unstable angina:.  Patient had typical symptoms of cardiac chest pain.  Cardiac enzymes were negative.  EKG showed no acute changes.  Cardiology was consulted at [facility] and recommended cardiac cath for which she was transferred to [facility].  She was on IV heparin drip prior to cardiac cath.  She is status post cardiac cath that showed 80% proximal LAD stenosis, now status post PCI.  Cardiology has evaluated her today and cleared her for discharge on appropriate medical regimen: Aspirin 81 mg daily, atorvastatin increased to 80 mg daily, clopidogrel 75 mg daily and lisinopril 10 mg daily.   PPI was changed from omeprazole to pantoprazole to avoid interaction with clopidogrel. 2. Normocytic anemia:  Stable.  Outpatient follow-up. 3. Insulin dependent diabetes/Type 1.    States that she has been a diabetic since age 23 years.  Continue on insulin pump.  CBGs mildly uncontrolled and fluctuating. Hold metformin due to recent cath and to be resumed after 48 hours.  A1c 7.8.  Outpatient follow-up with endocrinology. 4. Hypothyroidism.  Continue on Synthroid 5. Hyperlipidemia.  Continue statin >atorvastatin increased to 80 mg daily. 6. Hypertension.  Blood pressure stable.  Continue on hydrochlorothiazide and lisinopril. 7. History of migraines.  Continue on Topamax. 8. Chronic LBBB:     Consultants:   Cardiology  Procedures:   Left heart cath and coronary angiography with coronary stent intervention   Conclusion   Prox RCA lesion is 20% stenosed.  Prox LAD lesion is 80% stenosed.  A drug-eluting stent was successfully placed using a STENT SIERRA 3.00 X 18 MM.  Post intervention, there is a 0% residual stenosis.  The left ventricular systolic function is normal.  LV end diastolic pressure is normal.  The left ventricular ejection fraction is 55-65% by visual estimate.  There is no mitral valve regurgitation.  1. Severe stenosis proximal LAD 2. Successful PTCA/DES x 1 proximal LAD 3. Mild non-obstructive disease in the RCA\ 4. Normal LV systolic function  Recommendations: Continue statin and beta blocker.  Recommend uninterrupted dual antiplatelet therapy with Aspirin 81mg  daily and Clopidogrel 75mg  dailyfor a minimum of 6 months (stable ischemic heart disease - Class I recommendation).I would complete one year of ASA and Plavix if she tolerates well.    Discharge Instructions  Discharge Instructions    Amb Referral to Cardiac Rehabilitation   Complete by:  As directed    Diagnosis:  Coronary Stents   Call MD for:  difficulty breathing, headache or  visual disturbances   Complete by:  As directed    Call MD for:  extreme fatigue   Complete by:  As directed    Call MD for:  persistant dizziness or light-headedness   Complete by:  As directed    Call MD for:  persistant nausea and vomiting   Complete by:  As directed    Call MD for:  redness, tenderness, or signs of infection (pain, swelling, redness, odor or green/yellow discharge around incision site)   Complete by:  As directed    Call MD for:  severe uncontrolled pain   Complete by:  As directed    Call MD for:  temperature >100.4   Complete by:  As directed    Diet - low sodium heart healthy   Complete by:  As directed    Diet Carb Modified   Complete by:  As directed    Increase activity slowly   Complete by:  As directed        Medication List    STOP taking these medications   omeprazole 40 MG capsule Commonly known as:  PRILOSEC     TAKE these medications   aspirin EC 81 MG tablet Take 81 mg by mouth daily. Notes to patient:  Prevents clotting in stent and heart attack   atorvastatin 80 MG tablet Commonly known as:  LIPITOR Take 1 tablet (80 mg total) by mouth daily at 6 PM. What changed:    medication strength  how much to take  when to take this   AVASTIN IV Place into the left eye once. Notes to patient:  As directed    BLINK TEARS 0.25 % Soln Generic drug:  Polyethylene Glycol 400 Apply 1 drop to eye daily as needed (for eye irritation/dryness). Notes to patient:  As needed for eye irritation and dryness    CENTRUM SILVER 50+WOMEN Tabs Take 1 tablet by mouth daily. Notes to patient:  Supplement    clopidogrel 75 MG tablet Commonly known as:  PLAVIX Take 1 tablet (75 mg total) by mouth daily with breakfast. Start taking on:  03/24/2018   DERMOTIC 0.01 % Oil Generic drug:  Fluocinolone Acetonide Place 1 drop into both ears 3 (three) times a week.   dicyclomine 10 MG capsule Commonly known as:  BENTYL Take 10 mg by mouth 4 (four) times  daily -  before meals and at bedtime. Notes to patient:  Spasms    Fish Oil 1000 MG Caps Take 1,000 mg by mouth daily. Notes to patient:  Lowers cholesterol    gabapentin 400 MG capsule Commonly known as:  NEURONTIN Take 400-800 mg by mouth See admin instructions. 400mg  in the morning and 800mg  at bedtime Notes to patient:  Nerve pain    GENTEAL TEARS NIGHT-TIME Oint Apply 1 drop to eye daily as needed (for dryness/irritation). Notes to patient:  As needed for eye dryness and irritation    hydrochlorothiazide 12.5 MG capsule Commonly known as:  MICROZIDE Take 12.5 mg by mouth daily. Notes to patient:  Weak fluid pill Lowers blood pressure    insulin pump Soln Inject into the skin continuous. Novolog (up to 60 units as of 02/24/2018) Notes to patient:  As directed by physician    levothyroxine 125 MCG tablet Commonly known as:  SYNTHROID, LEVOTHROID Take 125 mcg by mouth daily before breakfast. Notes to patient:  Thyroid    lisinopril 10 MG tablet Commonly known as:  PRINIVIL,ZESTRIL take 1 tablet by mouth once daily Notes to patient:  Lowers blood pressure    loratadine 10 MG tablet Commonly known as:  CLARITIN Take 10 mg by mouth 2 (two) times daily. Notes to patient:  Allergies    metFORMIN 500 MG tablet Commonly known as:  GLUCOPHAGE Take 1 tablet (500 mg total) by mouth 2 (two) times daily with a meal. Do not take 03/23/2018 and 03/24/2018. Restart taking from 03/25/2018. What changed:  additional instructions Notes to patient:  Controls blood sugar   NOVOLOG 100 UNIT/ML injection Generic drug:  insulin aspart Inject into the skin continuous. Via insulin pump as directed up to 60 units (CGM) Notes to patient:  As directed by physician    pantoprazole 40 MG tablet Commonly known as:  PROTONIX Take 1 tablet (40 mg total) by mouth 2 (two) times daily before a meal.   topiramate 50 MG tablet Commonly known as:  TOPAMAX TAKE 1 TABLET BY MOUTH ONCE DAILY What  changed:  when to take this Notes to patient:  Headache/seizures    Vitamin D-3 5000 units Tabs Take 5,000 Units by mouth every 3 (three) days. Notes to patient:  Supplement       Follow-up Information    Herminio Commons, MD Follow up.   Specialty:  Cardiology Why:  our office will call you with a hospital follow-up visit in 3 weeks Contact information: [place] [address] [phone]        Monico Blitz, MD. Schedule an appointment as soon as possible for a visit in 1 week(s).   Specialty:  Internal Medicine Why:  To be seen with repeat labs (CBC & BMP). Contact information: [place] [address] [phone]        Jacelyn Pi, MD. Schedule an appointment as soon as possible for a visit.   Specialty:  Endocrinology Contact information: [place] [address] [phone]          Allergies  Allergen Reactions  . Fentanyl Nausea And Vomiting and Other (See Comments)    Nausea, vomiting, loss of consciousness, requiring reversal      Procedures/Studies: Dg Chest 2 View  Result Date: 03/20/2018 CLINICAL DATA:  Chest pain extending to LEFT shoulder, shortness of breath, diaphoresis EXAM: CHEST - 2 VIEW COMPARISON:  07/21/2017 FINDINGS: Normal heart size, mediastinal contours, and pulmonary vascularity. Mild chronic bronchitic changes. No acute infiltrate, pleural effusion or pneumothorax. Intraspinal stimulator noted. No acute osseous findings. IMPRESSION: Mild chronic bronchitic changes without infiltrate. Electronically Signed   By: Lavonia Dana M.D.   On: 03/20/2018 17:45      Subjective: No complaints reported.  No chest pain or dyspnea.  As per RN, no acute issues noted.  Discharge Exam:  Vitals:   03/23/18 0600 03/23/18 0825 03/23/18 0835 03/23/18 0852  BP: (!) 104/43 (!) 140/56    Pulse: 73     Resp: 17 (!) 38 12   Temp:    97.9 F (36.6 C)  TempSrc:    Oral  SpO2: 92%      Weight:      Height:        General exam: Pleasant middle-aged female, moderately built and obese sitting up comfortably in bed. Respiratory system: Clear to auscultation. Respiratory effort normal.   Cardiovascular  system: S1 & S2 heard, RRR. No JVD, murmurs, rubs, gallops or clicks.  Trace bilateral ankle edema. Gastrointestinal system: Abdomen is nondistended, soft and nontender. No organomegaly or masses felt. Normal bowel sounds heard.  Central nervous system: Alert and oriented. No focal neurological deficits.  Extremities: Symmetric 5 x 5 power.    Right wrist cath site without acute findings or bleeding. Skin: No rashes, lesions or ulcers Psychiatry: Judgement and insight appear normal. Mood & affect appropriate.     The results of significant diagnostics from this hospitalization (including imaging, microbiology, ancillary and laboratory) are listed below for reference.      Labs: CBC: Recent Labs  Lab 03/20/18 1613 03/22/18 0325 03/23/18 0240  WBC 5.1 5.5 5.5  NEUTROABS 3.0  --   --   HGB 11.3* 10.6* 10.5*  HCT 35.0* 33.7* 33.7*  MCV 90.4 91.8 92.1  PLT 206 175 794   Basic Metabolic Panel: Recent Labs  Lab 03/20/18 1613 03/22/18 0325 03/23/18 0240  NA 141 139 138  K 4.5 4.3 4.5  CL 109 110 109  CO2 24 23 23   GLUCOSE 162* 155* 204*  BUN 28* 32* 30*  CREATININE 1.04* 1.01* 1.09*  CALCIUM 9.3 8.8* 9.1   Cardiac Enzymes: Recent Labs  Lab 03/20/18 1613 03/20/18 1925 03/20/18 2217 03/21/18 0055  TROPONINI <0.03 <0.03 <0.03 <0.03   CBG: Recent Labs  Lab 03/21/18 0743 03/21/18 1128 03/21/18 1704 03/22/18 2148 03/23/18 0628  GLUCAP 86 156* 96 234* 124*      Time coordinating discharge: 25 minutes  SIGNED:  Vernell Leep, MD, FACP, [facility] Pager [phone]  If 7PM-7AM, please contact night-coverage [email or web address] Password [facility] 03/23/2018, 5:46 PM

## 2018-03-26 NOTE — Progress Notes (Signed)
Cardiology Office Note    Date:  03/27/2018   ID:  Jodi Ray, DOB 1961-01-07, MRN [id number]  PCP:  Monico Blitz, MD  Cardiologist: Kate Sable, MD    Chief Complaint  Patient presents with  . Hospitalization Follow-up    History of Present Illness:    Jodi Ray is a 57 y.o. female with past medical history of HTN, HLD, IDDM, and known LBBB who presents to the office today for hospital follow-up.  She was recently admitted to [facility] on 03/20/2018 for evaluation of chest pain. Reported having an episode of discomfort the day prior with pain lasting for approximately 60 seconds but she did experience associated discomfort down her left arm and diaphoresis. Cyclic troponin values remained negative and her EKG did show a known left bundle branch block. Given her previous NST in 10/2016 showing a possibly reversible defect with a moderate degree of ischemia not being excluded and presenting symptoms, a cardiac catheterization was recommended for definitive evaluation. She was transferred to [facility] and this was performed on 03/22/2018 by Dr. Martinique and showed 80% Proximal-LAD stenosis with 20% Proximal-RCA stenosis. Successful PTCA/DES x 1 was performed to the proximal LAD and she was started on DAPT with Aspirin 81mg  daily and Clopidogrel 75mg  daily. The morning following her procedure, she denied any recurrent chest pain. Was continued on Lisinopril 10mg  daily and Atorvastatin was titrated from 20mg  daily to 80mg  daily.   In talking with the patient today, she reports overall doing well since her recent hospitalization. She has noted a significant improvement in her fatigue since undergoing stent placement. Denies any repeat episodes of chest pain or dyspnea on exertion. No recent orthopnea, PND, lower extremity edema, or palpitations.  No complications regarding her radial cath site.  She reports good compliance with ASA and Plavix, denies missing any recent doses. No  evidence of melena, hematochezia, or hematuria by her report.  Past Medical History:  Diagnosis Date  . Adult RDS ([place])   . Anemia   . Brain aneurysm    frontal lobe  . CAD (coronary artery disease)    a. 02/2018: s/p DES to Proximal LAD with residual 20% RCA stenosis.  . Chronic pain   . Diabetes mellitus without complication ([place])   . Headache   . History of left bundle branch block (LBBB)   . HTN (hypertension)   . Hx of cardiovascular stress test 10/2016   intermediate risk study  . Hypothyroidism   . Neuromuscular disorder ([place])   . Neuropathy   . Obesity   . OSA on CPAP 03/10/2015  . Retinopathy   . Sleep apnea   . Varicose veins of both lower extremities     Past Surgical History:  Procedure Laterality Date  . BIOPSY  11/14/2016   Procedure: BIOPSY;  Surgeon: Danie Binder, MD;  Location: [facility];  Service: Endoscopy;;  colon gastric duodenal  . BREAST SURGERY Left 1994   Lumpectomy  . COLONOSCOPY WITH PROPOFOL N/A 11/14/2016   Dr. Oneida Alar: Moderately redundant rectosigmoid colon. Random colon biopsies benign. Internal hemorrhoids. Surveillance colonoscopy in 5 years.  . CORONARY STENT INTERVENTION N/A 03/22/2018   Procedure: CORONARY STENT INTERVENTION;  Surgeon: Burnell Blanks, MD;  Location: [place] [facility];  Service: Cardiovascular;  Laterality: N/A;  . ESOPHAGOGASTRODUODENOSCOPY (EGD) WITH PROPOFOL N/A 11/14/2016   Dr. Oneida Alar: Esophagus normal. Moderate gastritis, few gastric polyps. Duodenal biopsies negative. Fundic gland polyp gastric polyp area no H pylori.  Marland Kitchen FOOT  SURGERY    . LEFT HEART CATH AND CORONARY ANGIOGRAPHY N/A 03/22/2018   Procedure: LEFT HEART CATH AND CORONARY ANGIOGRAPHY;  Surgeon: Burnell Blanks, MD;  Location: [place] [facility];  Service: Cardiovascular;  Laterality: N/A;  . LEG SURGERY Right   . REPLACEMENT TOTAL KNEE Right   . SPINAL CORD STIMULATOR IMPLANT      Current Medications: Outpatient Medications Prior  to Visit  Medication Sig Dispense Refill  . aspirin EC 81 MG tablet Take 81 mg by mouth daily.    . Bevacizumab (AVASTIN IV) Place into the left eye once.    . Cholecalciferol (VITAMIN D-3) 5000 units TABS Take 5,000 Units by mouth every 3 (three) days.     Marland Kitchen dicyclomine (BENTYL) 10 MG capsule Take 10 mg by mouth 4 (four) times daily -  before meals and at bedtime.    . Fluocinolone Acetonide (DERMOTIC) 0.01 % OIL Place 1 drop into both ears 3 (three) times a week.     . gabapentin (NEURONTIN) 400 MG capsule Take 400-800 mg by mouth See admin instructions. 400mg  in the morning and 800mg  at bedtime    . hydrochlorothiazide (MICROZIDE) 12.5 MG capsule Take 12.5 mg by mouth daily.    . Insulin Human (INSULIN PUMP) SOLN Inject into the skin continuous. Novolog (up to 60 units as of 02/24/2018)    . levothyroxine (SYNTHROID, LEVOTHROID) 125 MCG tablet Take 125 mcg by mouth daily before breakfast.   0  . lisinopril (PRINIVIL,ZESTRIL) 10 MG tablet take 1 tablet by mouth once daily (Patient taking differently: Take 10 mg by mouth daily. ) 30 tablet 2  . loratadine (CLARITIN) 10 MG tablet Take 10 mg by mouth 2 (two) times daily.     . metFORMIN (GLUCOPHAGE) 500 MG tablet Take 1 tablet (500 mg total) by mouth 2 (two) times daily with a meal. Do not take 03/23/2018 and 03/24/2018. Restart taking from 03/25/2018.    . Multiple Vitamins-Minerals (CENTRUM SILVER 50+WOMEN) TABS Take 1 tablet by mouth daily.     Marland Kitchen NOVOLOG 100 UNIT/ML injection Inject into the skin continuous. Via insulin pump as directed up to 60 units (CGM)  0  . Omega-3 Fatty Acids (FISH OIL) 1000 MG CAPS Take 1,000 mg by mouth daily.    . pantoprazole (PROTONIX) 40 MG tablet Take 1 tablet (40 mg total) by mouth 2 (two) times daily before a meal. 60 tablet 0  . Polyethylene Glycol 400 (BLINK TEARS) 0.25 % SOLN Apply 1 drop to eye daily as needed (for eye irritation/dryness).    . topiramate (TOPAMAX) 50 MG tablet TAKE 1 TABLET BY MOUTH ONCE DAILY  (Patient taking differently: Take 50 mg by mouth every evening. ) 90 tablet 0  . White Petrolatum-Mineral Oil (GENTEAL TEARS NIGHT-TIME) OINT Apply 1 drop to eye daily as needed (for dryness/irritation).    Marland Kitchen atorvastatin (LIPITOR) 80 MG tablet Take 1 tablet (80 mg total) by mouth daily at 6 PM. 30 tablet 0  . clopidogrel (PLAVIX) 75 MG tablet Take 1 tablet (75 mg total) by mouth daily with breakfast. 30 tablet 0   No facility-administered medications prior to visit.      Allergies:   Fentanyl   Social History   Socioeconomic History  . Marital status: Widowed    Spouse name: Not on file  . Number of children: Not on file  . Years of education: Not on file  . Highest education level: Not on file  Occupational History  . Not  on file  Social Needs  . Financial resource strain: Not on file  . Food insecurity:    Worry: Not on file    Inability: Not on file  . Transportation needs:    Medical: Not on file    Non-medical: Not on file  Tobacco Use  . Smoking status: Never Smoker  . Smokeless tobacco: Never Used  Substance and Sexual Activity  . Alcohol use: No    Alcohol/week: 0.0 standard drinks  . Drug use: No  . Sexual activity: Yes    Partners: Male  Lifestyle  . Physical activity:    Days per week: Not on file    Minutes per session: Not on file  . Stress: Not on file  Relationships  . Social connections:    Talks on phone: Not on file    Gets together: Not on file    Attends religious service: Not on file    Active member of club or organization: Not on file    Attends meetings of clubs or organizations: Not on file    Relationship status: Not on file  Other Topics Concern  . Not on file  Social History Narrative   Drinks about 1-2 cups of caffeine daily.     Family History:  The patient's family history includes Cancer in her brother, father, other, sister, and sister; Cancer (age of onset: 82) in her mother.   Review of Systems:   Please see the history of  present illness.     General:  No chills, fever, night sweats or weight changes. Positive for fatigue (improved).  Cardiovascular:  No chest pain, dyspnea on exertion, edema, orthopnea, palpitations, paroxysmal nocturnal dyspnea. Dermatological: No rash, lesions/masses Respiratory: No cough, dyspnea Urologic: No hematuria, dysuria Abdominal:   No nausea, vomiting, diarrhea, bright red blood per rectum, melena, or hematemesis Neurologic:  No visual changes, wkns, changes in mental status. All other systems reviewed and are otherwise negative except as noted above.   Physical Exam:    VS:  BP (!) 110/56   Pulse 81   Ht 5\' 9"  (1.753 m)   Wt 259 lb (117.5 kg)   SpO2 95%   BMI 38.25 kg/m    General: Well developed, well nourished Caucasian female appearing in no acute distress. Head: Normocephalic, atraumatic, sclera non-icteric, no xanthomas, nares are without discharge.  Neck: No carotid bruits. JVD not elevated.  Lungs: Respirations regular and unlabored, without wheezes or rales.  Heart: Regular rate and rhythm. No S3 or S4.  No murmur, no rubs, or gallops appreciated. Abdomen: Soft, non-tender, non-distended with normoactive bowel sounds. No hepatomegaly. No rebound/guarding. No obvious abdominal masses. Msk:  Strength and tone appear normal for age. No joint deformities or effusions. Extremities: No clubbing or cyanosis. No edema.  Distal pedal pulses are 2+ bilaterally. Radial site without ecchymosis or evidence of a hematoma. Petechial rash present along her wrist which occurred when adhesive tape was placed during her hospitalization (This has improved since by her report).  Neuro: Alert and oriented X 3. Moves all extremities spontaneously. No focal deficits noted. Psych:  Responds to questions appropriately with a normal affect. Skin: No rashes or lesions noted  Wt Readings from Last 3 Encounters:  03/27/18 259 lb (117.5 kg)  03/23/18 245 lb 1.6 oz (111.2 kg)  02/14/18 255  lb (115.7 kg)     Studies/Labs Reviewed:   EKG:  EKG is not ordered today.  EKG from 03/23/2018 is reviewed which shows normal sinus rhythm,  heart rate 79, with known left bundle branch block.  Recent Labs: 03/23/2018: BUN 30; Creatinine, Ser 1.09; Hemoglobin 10.5; Platelets 187; Potassium 4.5; Sodium 138   Lipid Panel No results found for: CHOL, TRIG, HDL, CHOLHDL, VLDL, LDLCALC, LDLDIRECT  Additional studies/ records that were reviewed today include:   Cardiac Catheterization: 03/22/2018  Prox RCA lesion is 20% stenosed.  Prox LAD lesion is 80% stenosed.  A drug-eluting stent was successfully placed using a STENT SIERRA 3.00 X 18 MM.  Post intervention, there is a 0% residual stenosis.  The left ventricular systolic function is normal.  LV end diastolic pressure is normal.  The left ventricular ejection fraction is 55-65% by visual estimate.  There is no mitral valve regurgitation.   1. Severe stenosis proximal LAD 2. Successful PTCA/DES x 1 proximal LAD 3. Mild non-obstructive disease in the RCA\ 4. Normal LV systolic function  Recommendations: Continue statin and beta blocker.  Recommend uninterrupted dual antiplatelet therapy with Aspirin 81mg  daily and Clopidogrel 75mg  daily for a minimum of 6 months (stable ischemic heart disease - Class I recommendation). I would complete one year of ASA and Plavix if she tolerates well.   Assessment:    1. Coronary artery disease involving native coronary artery of native heart without angina pectoris   2. Essential hypertension   3. Hyperlipidemia LDL goal <70   4. Type 1 diabetes mellitus with complications (HCC)   5. CKD (chronic kidney disease), stage III ([place])   6. OSA (obstructive sleep apnea)      Plan:   In order of problems listed above:  1. CAD - recently admitted for chest pain and ruled out for ACS but given her presenting symptoms, she underwent a cardiac catheterization which showed 80% Proximal-LAD  stenosis with 20% Proximal-RCA stenosis. Successful PTCA/DES x 1 was performed to the proximal LAD. EF preserved at 55-65% by review of cath report.  - she has experienced significant improvement in her fatigue since undergoing stent placement and denies any repeat episodes of chest pain or dyspnea on exertion. - Cardiac catheterization report reviewed in detail. Compliance with ASA and Plavix reinforced. She will continue on DAPT along with statin therapy. A referral to cardiac rehab here at [facility] has been entered.   2. HTN - BP is well controlled at 110/56 during today's visit. Continue Hydrochlorothiazide 12.5 mg daily and Lisinopril 10 mg daily.  3. HLD - No recent FLP on file by review of Epic. Atorvastatin was titrated from 20 mg daily to 80 mg daily during her recent hospitalization.  Will obtain repeat FLP and LFT's in 6 weeks.   4. IDDM - Followed by Endocrinology. Hemoglobin A1c at 7.8 during recent admission.  5. Stage 3 CKD - Followed by Dr. Lowanda Foster. Creatinine stable at 1.09 following her recent cardiac catheterization.  6. OSA - continued compliance with CPAP encouraged.   Medication Adjustments/Labs and Tests Ordered: Current medicines are reviewed at length with the patient today.  Concerns regarding medicines are outlined above.  Medication changes, Labs and Tests ordered today are listed in the Patient Instructions below. Patient Instructions  Medication Instructions:  Your physician recommends that you continue on your current medications as directed. Please refer to the Current Medication list given to you today.   Labwork: FLP and LFT's in 6-8 weeks.   Testing/Procedures: NONE   Follow-Up: Your physician recommends that you schedule a follow-up appointment in: 3 Months in the [place] office.   Any Other Special Instructions Will Be Listed  Below (If Applicable).  If you need a refill on your cardiac medications before your next appointment, please call  your pharmacy.  Thank you for choosing [facility]!    Signed, Erma Heritage, PA-C  03/27/2018 5:23 PM    [place] [address] [place], [place] [address] Phone: [phone]

## 2018-03-27 ENCOUNTER — Encounter: Payer: Self-pay | Admitting: Student

## 2018-03-27 ENCOUNTER — Ambulatory Visit (INDEPENDENT_AMBULATORY_CARE_PROVIDER_SITE_OTHER): Payer: Medicare Other | Admitting: Student

## 2018-03-27 VITALS — BP 110/56 | HR 81 | Ht 69.0 in | Wt 259.0 lb

## 2018-03-27 DIAGNOSIS — E108 Type 1 diabetes mellitus with unspecified complications: Secondary | ICD-10-CM | POA: Diagnosis not present

## 2018-03-27 DIAGNOSIS — I2510 Atherosclerotic heart disease of native coronary artery without angina pectoris: Secondary | ICD-10-CM | POA: Diagnosis not present

## 2018-03-27 DIAGNOSIS — G4733 Obstructive sleep apnea (adult) (pediatric): Secondary | ICD-10-CM | POA: Diagnosis not present

## 2018-03-27 DIAGNOSIS — I10 Essential (primary) hypertension: Secondary | ICD-10-CM

## 2018-03-27 DIAGNOSIS — E785 Hyperlipidemia, unspecified: Secondary | ICD-10-CM

## 2018-03-27 DIAGNOSIS — N183 Chronic kidney disease, stage 3 unspecified: Secondary | ICD-10-CM

## 2018-03-27 MED ORDER — ATORVASTATIN CALCIUM 80 MG PO TABS: 80.0000 mg | ORAL_TABLET | Freq: Every day | ORAL | 3 refills | Status: DC

## 2018-03-27 MED ORDER — CLOPIDOGREL BISULFATE 75 MG PO TABS: 75.0000 mg | ORAL_TABLET | Freq: Every day | ORAL | 3 refills | Status: DC

## 2018-03-27 NOTE — Patient Instructions (Signed)
Medication Instructions:  Your physician recommends that you continue on your current medications as directed. Please refer to the Current Medication list given to you today.   Labwork: NONE   Testing/Procedures: NONE   Follow-Up: Your physician recommends that you schedule a follow-up appointment in: 3 Months in the [place] office.    Any Other Special Instructions Will Be Listed Below (If Applicable).     If you need a refill on your cardiac medications before your next appointment, please call your pharmacy.  Thank you for choosing [facility]!

## 2018-03-29 DIAGNOSIS — E1122 Type 2 diabetes mellitus with diabetic chronic kidney disease: Secondary | ICD-10-CM | POA: Diagnosis not present

## 2018-03-29 DIAGNOSIS — N183 Chronic kidney disease, stage 3 (moderate): Secondary | ICD-10-CM | POA: Diagnosis not present

## 2018-03-29 DIAGNOSIS — I10 Essential (primary) hypertension: Secondary | ICD-10-CM | POA: Diagnosis not present

## 2018-03-29 DIAGNOSIS — Z299 Encounter for prophylactic measures, unspecified: Secondary | ICD-10-CM | POA: Diagnosis not present

## 2018-03-29 DIAGNOSIS — Z6839 Body mass index (BMI) 39.0-39.9, adult: Secondary | ICD-10-CM | POA: Diagnosis not present

## 2018-03-29 DIAGNOSIS — E1165 Type 2 diabetes mellitus with hyperglycemia: Secondary | ICD-10-CM | POA: Diagnosis not present

## 2018-04-08 DIAGNOSIS — E113512 Type 2 diabetes mellitus with proliferative diabetic retinopathy with macular edema, left eye: Secondary | ICD-10-CM | POA: Diagnosis not present

## 2018-04-15 ENCOUNTER — Emergency Department (HOSPITAL_COMMUNITY): Payer: Medicare Other

## 2018-04-15 ENCOUNTER — Encounter (HOSPITAL_COMMUNITY): Payer: Self-pay | Admitting: *Deleted

## 2018-04-15 ENCOUNTER — Other Ambulatory Visit: Payer: Self-pay

## 2018-04-15 ENCOUNTER — Observation Stay (HOSPITAL_COMMUNITY)
Admission: EM | Admit: 2018-04-15 | Discharge: 2018-04-17 | Disposition: A | Discharge status: Home / Self Care | Payer: Medicare Other | Attending: Internal Medicine | Admitting: Internal Medicine

## 2018-04-15 DIAGNOSIS — I2510 Atherosclerotic heart disease of native coronary artery without angina pectoris: Secondary | ICD-10-CM | POA: Diagnosis not present

## 2018-04-15 DIAGNOSIS — E782 Mixed hyperlipidemia: Secondary | ICD-10-CM | POA: Diagnosis not present

## 2018-04-15 DIAGNOSIS — Z7902 Long term (current) use of antithrombotics/antiplatelets: Secondary | ICD-10-CM | POA: Insufficient documentation

## 2018-04-15 DIAGNOSIS — Z7982 Long term (current) use of aspirin: Secondary | ICD-10-CM | POA: Insufficient documentation

## 2018-04-15 DIAGNOSIS — Z96651 Presence of right artificial knee joint: Secondary | ICD-10-CM | POA: Diagnosis not present

## 2018-04-15 DIAGNOSIS — E1040 Type 1 diabetes mellitus with diabetic neuropathy, unspecified: Secondary | ICD-10-CM | POA: Diagnosis not present

## 2018-04-15 DIAGNOSIS — R000 Tachycardia, unspecified: Secondary | ICD-10-CM | POA: Diagnosis not present

## 2018-04-15 DIAGNOSIS — E039 Hypothyroidism, unspecified: Secondary | ICD-10-CM | POA: Diagnosis not present

## 2018-04-15 DIAGNOSIS — Z7984 Long term (current) use of oral hypoglycemic drugs: Secondary | ICD-10-CM | POA: Diagnosis not present

## 2018-04-15 DIAGNOSIS — R079 Chest pain, unspecified: Principal | ICD-10-CM | POA: Insufficient documentation

## 2018-04-15 DIAGNOSIS — Z79899 Other long term (current) drug therapy: Secondary | ICD-10-CM | POA: Insufficient documentation

## 2018-04-15 DIAGNOSIS — E1065 Type 1 diabetes mellitus with hyperglycemia: Secondary | ICD-10-CM | POA: Diagnosis present

## 2018-04-15 DIAGNOSIS — R0789 Other chest pain: Secondary | ICD-10-CM | POA: Diagnosis present

## 2018-04-15 DIAGNOSIS — I10 Essential (primary) hypertension: Secondary | ICD-10-CM | POA: Diagnosis not present

## 2018-04-15 DIAGNOSIS — IMO0002 Reserved for concepts with insufficient information to code with codable children: Secondary | ICD-10-CM | POA: Diagnosis present

## 2018-04-15 DIAGNOSIS — I447 Left bundle-branch block, unspecified: Secondary | ICD-10-CM | POA: Diagnosis not present

## 2018-04-15 DIAGNOSIS — R0689 Other abnormalities of breathing: Secondary | ICD-10-CM | POA: Diagnosis not present

## 2018-04-15 MED ORDER — ASPIRIN 325 MG PO TABS
325.0000 mg | ORAL_TABLET | Freq: Once | ORAL | Status: AC
  Administered 2018-04-15: 325 mg via ORAL
  Filled 2018-04-15: qty 1

## 2018-04-15 MED ORDER — NITROGLYCERIN 2 % TD OINT
1.0000 [in_us] | TOPICAL_OINTMENT | Freq: Once | TRANSDERMAL | Status: AC
  Administered 2018-04-15: 1 [in_us] via TOPICAL
  Filled 2018-04-15: qty 1

## 2018-04-15 NOTE — ED Triage Notes (Signed)
Pt brought in by [facility] for c/o chest pain that started tonight x 3 hours ago; pt has a hx of cardiac issues and had cardiac stents placed x 3 weeks ago

## 2018-04-15 NOTE — ED Notes (Signed)
Pt has not had period in 20 years  Post-menopausal

## 2018-04-15 NOTE — ED Provider Notes (Signed)
[facility] EMERGENCY DEPARTMENT Provider Note   CSN: [id number] Arrival date & time: 04/15/18  2249  Time seen 23:06 PM    History   Chief Complaint Chief Complaint  Patient presents with  . Chest Pain    HPI Jodi Ray is a 57 y.o. female.  HPI patient has a history of coronary artery disease and had her first stent placed on August 23.  She states she has had no chest pain until tonight around 7:30 PM she was sitting and Bible study and started getting the same type of pain she had had last month.  She states the pain is in her left central chest and states it felt like a freight train was going through her chest.  She states this pain was different from last month and that she did not have any arm discomfort or shortness of breath.  She states she may have gotten a little clammy but she was not diaphoretic.  She denies nausea or vomiting.  She states she checked her CBG because she is on insulin pump fearing that may be her blood sugar had dropped but it was 150.  She states she finished her Bible study and went home and she was still having the pain.  Her boyfriend called EMS.  She states EMS did not give her any medications.  She states when she arrived in the ER her pain had improved down to a 3 and at the time I talked to her in the room she said it was 8 0.  She estimates the chest pain lasted about 3-1/2 hours.  She states she is taking her Plavix and took her 81 mg of aspirin this morning.  PCP Monico Blitz, MD Cardiology Dr Jacinta Shoe  Past Medical History:  Diagnosis Date  . 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 Anemia   . Brain aneurysm    frontal lobe  . CAD (coronary artery disease)    a. 02/2018: s/p DES to Proximal LAD with residual 20% RCA stenosis.  . Chronic pain   . Diabetes mellitus without complication ([place])   . Headache   . History of left bundle branch block (LBBB)   . HTN (hypertension)   . Hx of cardiovascular stress test 10/2016   intermediate risk study  .  Hypothyroidism   . Neuromuscular disorder ([place])   . Neuropathy   . Obesity   . OSA on CPAP 03/10/2015  . Retinopathy   . Sleep apnea   . Varicose veins of both lower extremities     Patient Active Problem List   Diagnosis Date Noted  . CAD (coronary artery disease) 04/16/2018  . Unstable angina ([place]) 03/21/2018  . Chest pain 03/20/2018  . Gastritis and gastroduodenitis 03/05/2017  . Suprapubic pain 03/05/2017  . Right sided abdominal pain 10/24/2016  . Diarrhea 10/24/2016  . Family hx of colon cancer 10/24/2016  . Varicose veins of bilateral lower extremities with other complications 41/28/7867  . 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 Type I diabetes mellitus, uncontrolled ([place]) 04/30/2015  . Mixed hyperlipidemia 04/30/2015  . Primary hypothyroidism 04/30/2015  . Benign hypertension 04/30/2015  . OSA on CPAP 03/10/2015  . Migraine without aura and without status migrainosus, not intractable 01/08/2015  . Primary stabbing headache 10/02/2014    Past Surgical History:  Procedure Laterality Date  . BIOPSY  11/14/2016   Procedure: BIOPSY;  Surgeon: Danie Binder, MD;  Location: [facility];  Service: Endoscopy;;  colon gastric duodenal  .  BREAST SURGERY Left 1994   Lumpectomy  . COLONOSCOPY WITH PROPOFOL N/A 11/14/2016   Dr. Oneida Alar: Moderately redundant rectosigmoid colon. Random colon biopsies benign. Internal hemorrhoids. Surveillance colonoscopy in 5 years.  . CORONARY STENT INTERVENTION N/A 03/22/2018   Procedure: CORONARY STENT INTERVENTION;  Surgeon: Burnell Blanks, MD;  Location: [place] [facility];  Service: Cardiovascular;  Laterality: N/A;  . ESOPHAGOGASTRODUODENOSCOPY (EGD) WITH PROPOFOL N/A 11/14/2016   Dr. Oneida Alar: Esophagus normal. Moderate gastritis, few gastric polyps. Duodenal biopsies negative. Fundic gland polyp gastric polyp area no H pylori.  Marland Kitchen FOOT SURGERY    . LEFT HEART CATH AND CORONARY ANGIOGRAPHY N/A 03/22/2018   Procedure: LEFT HEART CATH  AND CORONARY ANGIOGRAPHY;  Surgeon: Burnell Blanks, MD;  Location: [place] [facility];  Service: Cardiovascular;  Laterality: N/A;  . LEG SURGERY Right   . REPLACEMENT TOTAL KNEE Right   . SPINAL CORD STIMULATOR IMPLANT       OB History   None      Home Medications    Prior to Admission medications   Medication Sig Start Date End Date Taking? Authorizing Provider  aspirin EC 81 MG tablet Take 81 mg by mouth daily.    [provider]  atorvastatin (LIPITOR) 80 MG tablet Take 1 tablet (80 mg total) by mouth daily at 6 PM. 03/27/18   Strader, [place] M, PA-C  Bevacizumab (AVASTIN IV) Place into the left eye once.    [provider]  Cholecalciferol (VITAMIN D-3) 5000 units TABS Take 5,000 Units by mouth every 3 (three) days.     [provider]  clopidogrel (PLAVIX) 75 MG tablet Take 1 tablet (75 mg total) by mouth daily with breakfast. 03/27/18   Ahmed Prima, Fransisco Hertz, PA-C  dicyclomine (BENTYL) 10 MG capsule Take 10 mg by mouth 4 (four) times daily -  before meals and at bedtime.    [provider]  Fluocinolone Acetonide (DERMOTIC) 0.01 % OIL Place 1 drop into both ears 3 (three) times a week.     [provider]  gabapentin (NEURONTIN) 400 MG capsule Take 400-800 mg by mouth See admin instructions. 400mg  in the morning and 800mg  at bedtime    [provider]  hydrochlorothiazide (MICROZIDE) 12.5 MG capsule Take 12.5 mg by mouth daily.    [provider]  Insulin Human (INSULIN PUMP) SOLN Inject into the skin continuous. Novolog (up to 60 units as of 02/24/2018)    [provider]  levothyroxine (SYNTHROID, LEVOTHROID) 125 MCG tablet Take 125 mcg by mouth daily before breakfast.  11/01/16   [provider]  lisinopril (PRINIVIL,ZESTRIL) 10 MG tablet take 1 tablet by mouth once daily Patient taking differently: Take 10 mg by mouth daily.  07/28/15   Cassandria Anger, MD  loratadine (CLARITIN) 10 MG  tablet Take 10 mg by mouth 2 (two) times daily.     [provider]  metFORMIN (GLUCOPHAGE) 500 MG tablet Take 1 tablet (500 mg total) by mouth 2 (two) times daily with a meal. Do not take 03/23/2018 and 03/24/2018. Restart taking from 03/25/2018. 03/23/18   Modena Jansky, MD  Multiple Vitamins-Minerals (CENTRUM SILVER 50+WOMEN) TABS Take 1 tablet by mouth daily.     [provider]  NOVOLOG 100 UNIT/ML injection Inject into the skin continuous. Via insulin pump as directed up to 60 units (CGM) 02/24/18   [provider]  Omega-3 Fatty Acids (FISH OIL) 1000 MG CAPS Take 1,000 mg by mouth daily.  [provider]  pantoprazole (PROTONIX) 40 MG tablet Take 1 tablet (40 mg total) by mouth 2 (two) times daily before a meal. 03/23/18 04/22/18  Hongalgi, Lenis Dickinson, MD  Polyethylene Glycol 400 (BLINK TEARS) 0.25 % SOLN Apply 1 drop to eye daily as needed (for eye irritation/dryness).    [provider]  topiramate (TOPAMAX) 50 MG tablet TAKE 1 TABLET BY MOUTH ONCE DAILY Patient taking differently: Take 50 mg by mouth every evening.  01/04/18   Pieter Partridge, DO  White Petrolatum-Mineral Oil (GENTEAL TEARS NIGHT-TIME) OINT Apply 1 drop to eye daily as needed (for dryness/irritation).    [provider]    Family History Family History  Problem Relation Age of Onset  . Cancer Mother 41       colon  . Cancer Father        renal cell  . Cancer Brother        bladder  . Cancer Sister        primary brain, and primary breast too  . Cancer Sister        breast   . Cancer Other        multiple aunts with breast, cervical, or colon cancer    Social History Social History   Tobacco Use  . Smoking status: Never Smoker  . Smokeless tobacco: Never Used  Substance Use Topics  . Alcohol use: No    Alcohol/week: 0.0 standard drinks  . Drug use: No  retired Engineer, site x 10 years Lives with boyfriend   Allergies   Fentanyl   Review of  Systems Review of Systems  All other systems reviewed and are negative.    Physical Exam Updated Vital Signs BP (!) 118/57   Pulse 81   Resp (!) 24   Ht 5\' 9"  (1.753 m)   Wt 117.5 kg   SpO2 97%   BMI 38.25 kg/m   Vital signs normal    Physical Exam  Constitutional: She is oriented to person, place, and time. She appears well-developed and well-nourished.  Non-toxic appearance. She does not appear ill. No distress.  HENT:  Head: Normocephalic and atraumatic.  Right Ear: External ear normal.  Left Ear: External ear normal.  Nose: Nose normal. No mucosal edema or rhinorrhea.  Mouth/Throat: Oropharynx is clear and moist and mucous membranes are normal. No dental abscesses or uvula swelling.  Eyes: Pupils are equal, round, and reactive to light. Conjunctivae and EOM are normal.  Neck: Normal range of motion and full passive range of motion without pain. Neck supple.  Cardiovascular: Normal rate, regular rhythm and normal heart sounds. Exam reveals no gallop and no friction rub.  No murmur heard. Pulmonary/Chest: Effort normal and breath sounds normal. No respiratory distress. She has no wheezes. She has no rhonchi. She has no rales. She exhibits no tenderness and no crepitus.  Area of chest pain noted    Abdominal: Soft. Normal appearance and bowel sounds are normal. She exhibits no distension. There is no tenderness. There is no rebound and no guarding.  Musculoskeletal: Normal range of motion. She exhibits no edema or tenderness.  Moves all extremities well.   Neurological: She is alert and oriented to person, place, and time. She has normal strength. No cranial nerve deficit.  Skin: Skin is warm, dry and intact. No rash noted. No erythema. No pallor.  Psychiatric: She has a normal mood and affect. Her speech is normal and behavior is normal. Her mood appears not  anxious.  Nursing note and vitals reviewed.    ED Treatments / Results  Labs (all labs ordered are listed, but  only abnormal results are displayed) Results for orders placed or performed during the hospital encounter of 04/15/18  CBC  Result Value Ref Range   WBC 4.6 4.0 - 10.5 K/uL   RBC 3.80 (L) 3.87 - 5.11 MIL/uL   Hemoglobin 11.1 (L) 12.0 - 15.0 g/dL   HCT 34.2 (L) 36.0 - 46.0 %   MCV 90.0 78.0 - 100.0 fL   MCH 29.2 26.0 - 34.0 pg   MCHC 32.5 30.0 - 36.0 g/dL   RDW 14.5 11.5 - 15.5 %   Platelets 188 150 - 400 K/uL  Comprehensive metabolic panel  Result Value Ref Range   Sodium 138 135 - 145 mmol/L   Potassium 4.0 3.5 - 5.1 mmol/L   Chloride 105 98 - 111 mmol/L   CO2 24 22 - 32 mmol/L   Glucose, Bld 209 (H) 70 - 99 mg/dL   BUN 28 (H) 6 - 20 mg/dL   Creatinine, Ser 1.27 (H) 0.44 - 1.00 mg/dL   Calcium 9.3 8.9 - 10.3 mg/dL   Total Protein 7.4 6.5 - 8.1 g/dL   Albumin 3.8 3.5 - 5.0 g/dL   AST 23 15 - 41 U/L   ALT 26 0 - 44 U/L   Alkaline Phosphatase 69 38 - 126 U/L   Total Bilirubin 0.6 0.3 - 1.2 mg/dL   GFR calc non Af Amer 45 (L) >60 mL/min   GFR calc Af Amer 52 (L) >60 mL/min   Anion gap 9 5 - 15  Troponin I  Result Value Ref Range   Troponin I <0.03 <0.03 ng/mL  Troponin I  Result Value Ref Range   Troponin I <0.03 <0.03 ng/mL   Laboratory interpretation all normal except mild anemia, renal insufficiency, hyperglycemia    EKG EKG Interpretation  Date/Time:  Monday April 15 2018 23:01:20 EDT Ventricular Rate:  81 PR Interval:    QRS Duration: 141 QT Interval:  400 QTC Calculation: 465 R Axis:   33 Text Interpretation:  Sinus rhythm Left bundle branch block No significant change since last tracing 23 Mar 2018 Confirmed by Rolland Porter [phone]) on 04/15/2018 11:04:30 PM   Radiology Dg Chest 2 View  Result Date: 04/16/2018 CLINICAL DATA:  Mid chest pain for 5 hours. Patient had myocardial infarction 3 weeks ago and had stents placed. EXAM: CHEST - 2 VIEW COMPARISON:  None. FINDINGS: The heart size and mediastinal contours are within normal limits. Both lungs are  clear. Neural stimulator lead projects over the lower thoracic spine as before. 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 No acute osseous abnormality. IMPRESSION: No active cardiopulmonary disease.  No pulmonary edema. Electronically Signed   By: Ashley Royalty M.D.   On: 04/16/2018 00:34      Cardiac Cath   March 22, 2018  Prox RCA lesion is 20% stenosed.  Prox LAD lesion is 80% stenosed.  A drug-eluting stent was successfully placed using a STENT SIERRA 3.00 X 18 MM.  Post intervention, there is a 0% residual stenosis.  The left ventricular systolic function is normal.  LV end diastolic pressure is normal.  The left ventricular ejection fraction is 55-65% by visual estimate.  There is no mitral valve regurgitation.   1. Severe stenosis proximal LAD 2. Successful PTCA/DES x 1 proximal LAD 3. Mild non-obstructive disease in the RCA\ 4. Normal LV systolic function  Recommendations:  Continue statin and beta blocker.  Recommend uninterrupted dual antiplatelet therapy with Aspirin 81mg  daily and Clopidogrel 75mg  daily for a minimum of 6 months (stable ischemic heart disease - Class I recommendation). I would complete one year of ASA and Plavix if she tolerates well.   Procedures Procedures (including critical care time)  Medications Ordered in ED Medications  aspirin tablet 325 mg (325 mg Oral Given 04/15/18 2332)  nitroGLYCERIN (NITROGLYN) 2 % ointment 1 inch (1 inch Topical Given 04/15/18 2357)     Initial Impression / Assessment and Plan / ED Course  I have reviewed the triage vital signs and the nursing notes.  Pertinent labs & imaging results that were available during my care of the patient were reviewed by me and considered in my medical decision making (see chart for details).   Patient was given full dose aspirin and nitroglycerin 1 inch to her chest.  She was advised to let the nurse know if she gets any chest pain again.  Laboratory testing was  ordered.  01:06 AM Dr Aundra Dubin, Cardiology, states patient can stay here and get ruled out and see cardiology in the morning.  1:20 AM I talked to the patient about what Dr. Aundra Dubin recommended.  She has remained pain-free in the ED.  I will talk to the hospitalist about admission.  1:28 AM Dr. Manuella Ghazi, hospitalist will admit.  Final Clinical Impressions(s) / ED Diagnoses   Final diagnoses:  Chest pain, unspecified type    Plan admission  Rolland Porter, MD, Barbette Or, MD 04/16/18 [phone]

## 2018-04-16 ENCOUNTER — Other Ambulatory Visit: Payer: Self-pay | Admitting: Cardiovascular Disease

## 2018-04-16 ENCOUNTER — Other Ambulatory Visit: Payer: Self-pay

## 2018-04-16 ENCOUNTER — Other Ambulatory Visit: Payer: Self-pay | Admitting: *Deleted

## 2018-04-16 DIAGNOSIS — I2583 Coronary atherosclerosis due to lipid rich plaque: Secondary | ICD-10-CM

## 2018-04-16 DIAGNOSIS — E782 Mixed hyperlipidemia: Secondary | ICD-10-CM | POA: Diagnosis not present

## 2018-04-16 DIAGNOSIS — R072 Precordial pain: Secondary | ICD-10-CM

## 2018-04-16 DIAGNOSIS — I2510 Atherosclerotic heart disease of native coronary artery without angina pectoris: Secondary | ICD-10-CM | POA: Diagnosis not present

## 2018-04-16 DIAGNOSIS — R079 Chest pain, unspecified: Secondary | ICD-10-CM | POA: Diagnosis not present

## 2018-04-16 LAB — COMPREHENSIVE METABOLIC PANEL
ALK PHOS: 69 U/L (ref 38–126)
ALT: 26 U/L (ref 0–44)
ANION GAP: 9 (ref 5–15)
AST: 23 U/L (ref 15–41)
Albumin: 3.8 g/dL (ref 3.5–5.0)
BILIRUBIN TOTAL: 0.6 mg/dL (ref 0.3–1.2)
BUN: 28 mg/dL — ABNORMAL HIGH (ref 6–20)
CHLORIDE: 105 mmol/L (ref 98–111)
CO2: 24 mmol/L (ref 22–32)
Calcium: 9.3 mg/dL (ref 8.9–10.3)
Creatinine, Ser: 1.27 mg/dL — ABNORMAL HIGH (ref 0.44–1.00)
GFR calc Af Amer: 52 mL/min — ABNORMAL LOW (ref >60)
GFR calc non Af Amer: 45 mL/min — ABNORMAL LOW (ref >60)
Glucose, Bld: 209 mg/dL — ABNORMAL HIGH (ref 70–99)
POTASSIUM: 4 mmol/L (ref 3.5–5.1)
SODIUM: 138 mmol/L (ref 135–145)
TOTAL PROTEIN: 7.4 g/dL (ref 6.5–8.1)

## 2018-04-16 LAB — TROPONIN I
Troponin I: <0.03 ng/mL (ref <0.03)
Troponin I: <0.03 ng/mL (ref <0.03)

## 2018-04-16 LAB — GLUCOSE, CAPILLARY
GLUCOSE-CAPILLARY: 107 mg/dL — AB (ref 70–99)
GLUCOSE-CAPILLARY: 195 mg/dL — AB (ref 70–99)
GLUCOSE-CAPILLARY: 128 mg/dL — AB (ref 70–99)
Glucose-Capillary: 108 mg/dL — ABNORMAL HIGH (ref 70–99)

## 2018-04-16 LAB — CBC
HCT: 34.2 % — ABNORMAL LOW (ref 36.0–46.0)
HEMOGLOBIN: 11.1 g/dL — AB (ref 12.0–15.0)
MCH: 29.2 pg (ref 26.0–34.0)
MCHC: 32.5 g/dL (ref 30.0–36.0)
MCV: 90 fL (ref 78.0–100.0)
Platelets: 188 10*3/uL (ref 150–400)
RBC: 3.8 MIL/uL — AB (ref 3.87–5.11)
RDW: 14.5 % (ref 11.5–15.5)
WBC: 4.6 10*3/uL (ref 4.0–10.5)

## 2018-04-16 LAB — LIPID PANEL
Cholesterol: 134 mg/dL (ref 0–200)
HDL: 75 mg/dL (ref >40)
LDL Cholesterol: 54 mg/dL (ref 0–99)
Total CHOL/HDL Ratio: 1.8 RATIO
Triglycerides: 23 mg/dL (ref <150)
VLDL: 5 mg/dL (ref 0–40)

## 2018-04-16 MED ORDER — LEVOTHYROXINE SODIUM 50 MCG PO TABS
125.0000 ug | ORAL_TABLET | Freq: Every day | ORAL | Status: DC
  Administered 2018-04-16 – 2018-04-17 (×2): 125 ug via ORAL
  Filled 2018-04-16 (×2): qty 3

## 2018-04-16 MED ORDER — OMEGA-3-ACID ETHYL ESTERS 1 G PO CAPS
1000.0000 mg | ORAL_CAPSULE | Freq: Every day | ORAL | Status: DC
  Administered 2018-04-16 – 2018-04-17 (×2): 1000 mg via ORAL
  Filled 2018-04-16 (×2): qty 1

## 2018-04-16 MED ORDER — NITROGLYCERIN 0.4 MG SL SUBL
0.4000 mg | SUBLINGUAL_TABLET | SUBLINGUAL | Status: DC | PRN
  Administered 2018-04-16: 0.4 mg via SUBLINGUAL

## 2018-04-16 MED ORDER — HEPARIN SODIUM (PORCINE) 5000 UNIT/ML IJ SOLN
5000.0000 [IU] | Freq: Three times a day (TID) | INTRAMUSCULAR | Status: DC
  Administered 2018-04-16: 5000 [IU] via SUBCUTANEOUS
  Filled 2018-04-16: qty 1

## 2018-04-16 MED ORDER — INSULIN ASPART 100 UNIT/ML ~~LOC~~ SOLN: 0.0000 [IU] | Freq: Three times a day (TID) | SUBCUTANEOUS | Status: DC

## 2018-04-16 MED ORDER — ACETAMINOPHEN 325 MG PO TABS
650.0000 mg | ORAL_TABLET | Freq: Four times a day (QID) | ORAL | Status: DC | PRN
  Administered 2018-04-16: 650 mg via ORAL

## 2018-04-16 MED ORDER — LORATADINE 10 MG PO TABS
10.0000 mg | ORAL_TABLET | Freq: Two times a day (BID) | ORAL | Status: DC
  Administered 2018-04-16: 10 mg via ORAL
  Filled 2018-04-16 (×3): qty 1

## 2018-04-16 MED ORDER — NITROGLYCERIN 0.4 MG SL SUBL
SUBLINGUAL_TABLET | SUBLINGUAL | Status: AC
  Filled 2018-04-16: qty 1

## 2018-04-16 MED ORDER — ONDANSETRON HCL 4 MG PO TABS: 4.0000 mg | ORAL_TABLET | Freq: Four times a day (QID) | ORAL | Status: DC | PRN

## 2018-04-16 MED ORDER — DICYCLOMINE HCL 10 MG PO CAPS
10.0000 mg | ORAL_CAPSULE | Freq: Three times a day (TID) | ORAL | Status: DC
  Administered 2018-04-16 – 2018-04-17 (×4): 10 mg via ORAL
  Filled 2018-04-16 (×4): qty 1

## 2018-04-16 MED ORDER — HEPARIN SODIUM (PORCINE) 5000 UNIT/ML IJ SOLN
INTRAMUSCULAR | Status: AC
  Filled 2018-04-16: qty 1

## 2018-04-16 MED ORDER — SODIUM CHLORIDE 0.9 % IV SOLN: 250.0000 mL | INTRAVENOUS | Status: DC | PRN

## 2018-04-16 MED ORDER — INSULIN PUMP
SUBCUTANEOUS | Status: DC
  Administered 2018-04-16: 17:00:00 via SUBCUTANEOUS
  Filled 2018-04-16: qty 1

## 2018-04-16 MED ORDER — HYDROCHLOROTHIAZIDE 12.5 MG PO CAPS
12.5000 mg | ORAL_CAPSULE | Freq: Every day | ORAL | Status: DC
  Administered 2018-04-16 – 2018-04-17 (×2): 12.5 mg via ORAL
  Filled 2018-04-16 (×2): qty 1

## 2018-04-16 MED ORDER — VITAMIN D 1000 UNITS PO TABS: 5000.0000 [IU] | ORAL_TABLET | ORAL | Status: DC

## 2018-04-16 MED ORDER — ADULT MULTIVITAMIN W/MINERALS CH
1.0000 | ORAL_TABLET | Freq: Every day | ORAL | Status: DC
  Administered 2018-04-16 – 2018-04-17 (×2): 1 via ORAL
  Filled 2018-04-16 (×2): qty 1

## 2018-04-16 MED ORDER — PANTOPRAZOLE SODIUM 40 MG PO TBEC
40.0000 mg | DELAYED_RELEASE_TABLET | Freq: Two times a day (BID) | ORAL | Status: DC
  Administered 2018-04-16 – 2018-04-17 (×2): 40 mg via ORAL
  Filled 2018-04-16 (×2): qty 1

## 2018-04-16 MED ORDER — ACETAMINOPHEN 650 MG RE SUPP: 650.0000 mg | Freq: Four times a day (QID) | RECTAL | Status: DC | PRN

## 2018-04-16 MED ORDER — GENTEAL TEARS NIGHT-TIME OP OINT: 1.0000 [drp] | TOPICAL_OINTMENT | Freq: Every day | OPHTHALMIC | Status: DC | PRN

## 2018-04-16 MED ORDER — SODIUM CHLORIDE 0.9% FLUSH
3.0000 mL | Freq: Two times a day (BID) | INTRAVENOUS | Status: DC
  Administered 2018-04-16 – 2018-04-17 (×3): 3 mL via INTRAVENOUS

## 2018-04-16 MED ORDER — INSULIN ASPART 100 UNIT/ML ~~LOC~~ SOLN: 0.0000 [IU] | Freq: Every day | SUBCUTANEOUS | Status: DC

## 2018-04-16 MED ORDER — ONDANSETRON HCL 4 MG/2ML IJ SOLN: 4.0000 mg | Freq: Four times a day (QID) | INTRAMUSCULAR | Status: DC | PRN

## 2018-04-16 MED ORDER — SODIUM CHLORIDE 0.9% FLUSH: 3.0000 mL | INTRAVENOUS | Status: DC | PRN

## 2018-04-16 MED ORDER — ATORVASTATIN CALCIUM 40 MG PO TABS
80.0000 mg | ORAL_TABLET | Freq: Every day | ORAL | Status: DC
  Administered 2018-04-16: 80 mg via ORAL
  Filled 2018-04-16: qty 2

## 2018-04-16 MED ORDER — ACETAMINOPHEN 325 MG PO TABS
ORAL_TABLET | ORAL | Status: AC
  Filled 2018-04-16: qty 2

## 2018-04-16 MED ORDER — GABAPENTIN 400 MG PO CAPS
400.0000 mg | ORAL_CAPSULE | Freq: Every day | ORAL | Status: DC
  Administered 2018-04-16 – 2018-04-17 (×2): 400 mg via ORAL
  Filled 2018-04-16 (×2): qty 1

## 2018-04-16 MED ORDER — LISINOPRIL 10 MG PO TABS
10.0000 mg | ORAL_TABLET | Freq: Every day | ORAL | Status: DC
  Administered 2018-04-16 – 2018-04-17 (×2): 10 mg via ORAL
  Filled 2018-04-16 (×2): qty 1

## 2018-04-16 MED ORDER — INSULIN PUMP
Freq: Three times a day (TID) | SUBCUTANEOUS | Status: DC
  Administered 2018-04-16 (×2): via SUBCUTANEOUS
  Administered 2018-04-17: 1.4 via SUBCUTANEOUS
  Administered 2018-04-17: 02:00:00 via SUBCUTANEOUS
  Filled 2018-04-16: qty 1

## 2018-04-16 MED ORDER — FLUOCINOLONE ACETONIDE 0.01 % OT OIL: 1.0000 [drp] | TOPICAL_OIL | OTIC | Status: DC

## 2018-04-16 MED ORDER — POLYVINYL ALCOHOL 1.4 % OP SOLN: 1.0000 [drp] | Freq: Every day | OPHTHALMIC | Status: DC | PRN

## 2018-04-16 MED ORDER — ASPIRIN EC 81 MG PO TBEC
81.0000 mg | DELAYED_RELEASE_TABLET | Freq: Every day | ORAL | Status: DC
  Administered 2018-04-16 – 2018-04-17 (×2): 81 mg via ORAL
  Filled 2018-04-16 (×2): qty 1

## 2018-04-16 MED ORDER — ATORVASTATIN CALCIUM 80 MG PO TABS: 80.0000 mg | ORAL_TABLET | Freq: Every day | ORAL | 3 refills | Status: DC

## 2018-04-16 MED ORDER — CLOPIDOGREL BISULFATE 75 MG PO TABS
75.0000 mg | ORAL_TABLET | Freq: Every day | ORAL | Status: DC
  Administered 2018-04-16 – 2018-04-17 (×2): 75 mg via ORAL
  Filled 2018-04-16 (×2): qty 1

## 2018-04-16 MED ORDER — GABAPENTIN 400 MG PO CAPS
800.0000 mg | ORAL_CAPSULE | Freq: Every day | ORAL | Status: DC
  Administered 2018-04-16: 800 mg via ORAL
  Filled 2018-04-16: qty 2

## 2018-04-16 MED ORDER — TOPIRAMATE 25 MG PO TABS
50.0000 mg | ORAL_TABLET | Freq: Every evening | ORAL | Status: DC
  Administered 2018-04-16: 50 mg via ORAL
  Filled 2018-04-16: qty 2

## 2018-04-16 NOTE — Progress Notes (Signed)
04/15/2018 10:49 PM  04/16/2018 8:52 AM  Jodi Ray was seen and examined.  The H&P by the admitting provider, orders, imaging was reviewed.  Please see new orders.  Pt says that the chest pain symptoms are very similar to right before she had to have recent stents placed. Her enzymes have been negative so far.  Cardiology consultation pending.    Vitals:   04/16/18 0800 04/16/18 0838  BP: (!) 113/53 139/90  Pulse: 72 85  Resp: 14 20  Temp:  97.9 F (36.6 C)  SpO2: 96% 98%   Results for orders placed or performed during the hospital encounter of 04/15/18  CBC  Result Value Ref Range   WBC 4.6 4.0 - 10.5 K/uL   RBC 3.80 (L) 3.87 - 5.11 MIL/uL   Hemoglobin 11.1 (L) 12.0 - 15.0 g/dL   HCT 34.2 (L) 36.0 - 46.0 %   MCV 90.0 78.0 - 100.0 fL   MCH 29.2 26.0 - 34.0 pg   MCHC 32.5 30.0 - 36.0 g/dL   RDW 14.5 11.5 - 15.5 %   Platelets 188 150 - 400 K/uL  Comprehensive metabolic panel  Result Value Ref Range   Sodium 138 135 - 145 mmol/L   Potassium 4.0 3.5 - 5.1 mmol/L   Chloride 105 98 - 111 mmol/L   CO2 24 22 - 32 mmol/L   Glucose, Bld 209 (H) 70 - 99 mg/dL   BUN 28 (H) 6 - 20 mg/dL   Creatinine, Ser 1.27 (H) 0.44 - 1.00 mg/dL   Calcium 9.3 8.9 - 10.3 mg/dL   Total Protein 7.4 6.5 - 8.1 g/dL   Albumin 3.8 3.5 - 5.0 g/dL   AST 23 15 - 41 U/L   ALT 26 0 - 44 U/L   Alkaline Phosphatase 69 38 - 126 U/L   Total Bilirubin 0.6 0.3 - 1.2 mg/dL   GFR calc non Af Amer 45 (L) >60 mL/min   GFR calc Af Amer 52 (L) >60 mL/min   Anion gap 9 5 - 15  Troponin I  Result Value Ref Range   Troponin I <0.03 <0.03 ng/mL  Troponin I  Result Value Ref Range   Troponin I <0.03 <0.03 ng/mL  Glucose, capillary  Result Value Ref Range   Glucose-Capillary 107 (H) 70 - 99 mg/dL     Murvin Natal, MD [facility]

## 2018-04-16 NOTE — ED Notes (Signed)
Have removed Nitro Patch

## 2018-04-16 NOTE — ED Notes (Signed)
Pt states chest pain has resolved.

## 2018-04-16 NOTE — Progress Notes (Signed)
Inpatient Diabetes Program Recommendations  AACE/ADA: New Consensus Statement on Inpatient Glycemic Control (2015)  Target Ranges:  Prepandial:   less than 140 mg/dL      Peak postprandial:   less than 180 mg/dL (1-2 hours)      Critically ill patients:  140 - 180 mg/dL   Lab Results  Component Value Date   GLUCAP 195 (H) 04/16/2018   HGBA1C 7.8 (H) 03/20/2018    Review of Glycemic Control Results for Jodi Ray, Jodi Ray (MRN [id number]) as of 04/16/2018 15:15  Ref. Range 04/16/2018 08:45 04/16/2018 11:27  Glucose-Capillary Latest Ref Range: 70 - 99 mg/dL 107 (H) 195 (H)   Diabetes history: Type 1 DM Outpatient Diabetes medications: Novolog per insulin pump (up to 60 units QD, Metformin 500 mg BID Current orders for Inpatient glycemic control: Novolog 0-9 units TID, Novolog 0-5 units QHS, insulin pump  Inpatient Diabetes Program Recommendations:    Spoke with patient regarding outpatient use of insulin pump (via phone, at [facility]). Patient is followed by Dr Chalmers Cater for DM and she was diagnosed with diabetes at the age of 57 years old. Patient uses a Medtronic insulin pump with Novolog insulin as an outpatient. Patient has insulin pump in the bed with her and it is connected. Next time to change site is due for Thursday AM.    Current insulin pump settings are as follows:  Basal insulin  12A- 0400 1.0 units/hour 0401-0800 1.1 units/hour 0801-1200 1.2 units/hour 1201-1700 1.25 units/hour 1701-2200 1.20 units/hour 2201-0000 1.05 units/hour Total daily basal insulin: 27.55 units/24 hours  Carb Coverage 0000-0630 1 unit for every 18 grams of carbohydrates 0631-1330 1 unit for every 16 grams of carbohydrates 1331-1700 1 unit for every 18 grams of carbohydrates 1701-2000 1 unit for every 15 grams of carbohydrates 2001-0000 1 unit for every 18 grams of carbohydrates   Insulin Sensitivity 1:50 1 unit drops blood glucose 50 mg/dl  Target Glucose Goals 12A-12A 120-120 mg/dl   In  talking with the patient he states that his blood glucose normally runs very good and his last A1C was 7.8%. Patient verbalized understanding of information discussed and states that he does not have any further questions related to diabetes at this time.  NURSING: Once insulin pump order set is ordered please print off the Patient insulin pump contract and flow sheet. The insulin pump contract should be signed by the patient and then placed in the chart. The patient insulin pump flow sheet will be completed by the patient at the bedside and the RN caring for the patient will use the patient's flow sheet to document in the [facility]. RN will need to complete the Nursing Insulin Pump Flowsheet at least once a shift. Patient will need to keep extra insulin pump supplies at the bedside at all times.   Thanks, Bronson Curb, MSN, RNC-OB Diabetes Coordinator [phone] (8a-5p)

## 2018-04-16 NOTE — Care Management Obs Status (Signed)
[place] NOTIFICATION   Patient Details  Name: Jodi Ray MRN: [id number] Date of Birth: [birth year]   Medicare Observation Status Notification Given:  Yes    Juandaniel Manfredo, Chauncey Reading, RN 04/16/2018, 8:32 AM

## 2018-04-16 NOTE — ED Notes (Signed)
Pt states her CGM states her sugar is 146

## 2018-04-16 NOTE — H&P (Signed)
History and Physical    Jodi Ray WNU:[id number] DOB: [birth year] DOA: 04/15/2018  PCP: Monico Blitz, MD   Patient coming from: Home  Chief Complaint: Chest pain  HPI: Jodi Ray is a 57 y.o. female with medical history significant for CAD with recent stent to LAD 3 weeks ago, type 1 diabetes on insulin pump, hypertension, dyslipidemia, hypothyroidism, and obesity who presented to the ED today after she began experiencing sudden onset, left-sided substernal chest pain that began as she was sitting during Bible study at approximately 7:30 PM.  She states that the pain felt like a "freight train."  She denies any radiation of the pain and did not have any associated dyspnea, unlike 3 weeks ago when she had her anginal symptoms.  She denies any diaphoresis, nausea, vomiting, palpitations, or other symptomatology.  She had no lightheadedness or dizziness.  She states that the symptoms persisted for approximately 2 to 3 hours, but the pain was most intense in the first hour.  She appears to have no particular aggravating or alleviating factors noted.  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   ED Course: Patient's pain had improved significantly by the time she arrived to the ED and her vitals were noted to be stable.  Her initial troponin was less than 0.03 and her EKG demonstrated her chronic left bundle branch block.  Hemoglobin was 11.1 and stable.  Creatinine is mildly elevated at 1.27 whereas baseline is typically near 1.1.  Glucose is noted to be 209.  She has received a full dose aspirin and Nitropaste and was otherwise pain-free even prior to the Nitropaste.  EDP had spoken with Dr. Aundra Dubin of cardiology who states that patient could remain here for chest pain rule out and be seen by cardiology in a.m.  Review of Systems: All others reviewed and otherwise negative.  Past  Medical History:  Diagnosis Date  . Adult RDS ([place])   . Anemia   . Brain aneurysm    frontal lobe  . CAD (coronary artery disease)    a. 02/2018: s/p DES to Proximal LAD with residual 20% RCA stenosis.  . Chronic pain   . Diabetes mellitus without complication ([place])   . Headache   . History of left bundle branch block (LBBB)   . HTN (hypertension)   . Hx of cardiovascular stress test 10/2016   intermediate risk study  . Hypothyroidism   . Neuromuscular disorder ([place])   . Neuropathy   . Obesity   . OSA on CPAP 03/10/2015  . Retinopathy   . Sleep apnea   . Varicose veins of both lower extremities     Past Surgical History:  Procedure Laterality Date  . BIOPSY  11/14/2016   Procedure: BIOPSY;  Surgeon: Danie Binder, MD;  Location: [facility];  Service: Endoscopy;;  colon gastric duodenal  . BREAST SURGERY Left 1994   Lumpectomy  . COLONOSCOPY WITH PROPOFOL N/A 11/14/2016   Dr. Oneida Alar: Moderately redundant rectosigmoid colon. Random colon biopsies benign. Internal hemorrhoids. Surveillance colonoscopy in 5 years.  . CORONARY STENT INTERVENTION N/A 03/22/2018   Procedure: CORONARY STENT INTERVENTION;  Surgeon: Burnell Blanks, MD;  Location: [facility];  Service: Cardiovascular;  Laterality: N/A;  . ESOPHAGOGASTRODUODENOSCOPY (EGD) WITH PROPOFOL N/A 11/14/2016   Dr. Oneida Alar: Esophagus normal. Moderate gastritis, few gastric polyps. Duodenal biopsies negative. Fundic gland polyp gastric polyp  area no H pylori.  Marland Kitchen FOOT SURGERY    . LEFT HEART CATH AND CORONARY ANGIOGRAPHY N/A 03/22/2018   Procedure: LEFT HEART CATH AND CORONARY ANGIOGRAPHY;  Surgeon: Burnell Blanks, MD;  Location: [facility];  Service: Cardiovascular;  Laterality: N/A;  . LEG SURGERY Right   . REPLACEMENT TOTAL KNEE Right   . SPINAL CORD STIMULATOR IMPLANT       reports that she has never smoked. She has never used smokeless tobacco. She reports that she does not drink alcohol or  use drugs.  Allergies  Allergen Reactions  . Fentanyl Nausea And Vomiting and Other (See Comments)    Nausea, vomiting, loss of consciousness, requiring reversal    Family History  Problem Relation Age of Onset  . Cancer Mother 22       colon  . Cancer Father        renal cell  . Cancer Brother        bladder  . Cancer Sister        primary brain, and primary breast too  . Cancer Sister        breast   . Cancer Other        multiple aunts with breast, cervical, or colon cancer    Prior to Admission medications   Medication Sig Start Date End Date Taking? Authorizing Provider  aspirin EC 81 MG tablet Take 81 mg by mouth daily.    [provider]  atorvastatin (LIPITOR) 80 MG tablet Take 1 tablet (80 mg total) by mouth daily at 6 PM. 03/27/18   Strader, [place] M, PA-C  Bevacizumab (AVASTIN IV) Place into the left eye once.    [provider]  Cholecalciferol (VITAMIN D-3) 5000 units TABS Take 5,000 Units by mouth every 3 (three) days.     [provider]  clopidogrel (PLAVIX) 75 MG tablet Take 1 tablet (75 mg total) by mouth daily with breakfast. 03/27/18   Ahmed Prima, Fransisco Hertz, PA-C  dicyclomine (BENTYL) 10 MG capsule Take 10 mg by mouth 4 (four) times daily -  before meals and at bedtime.    [provider]  Fluocinolone Acetonide (DERMOTIC) 0.01 % OIL Place 1 drop into both ears 3 (three) times a week.     [provider]  gabapentin (NEURONTIN) 400 MG capsule Take 400-800 mg by mouth See admin instructions. 400mg  in the morning and 800mg  at bedtime    [provider]  hydrochlorothiazide (MICROZIDE) 12.5 MG capsule Take 12.5 mg by mouth daily.    [provider]  Insulin Human (INSULIN PUMP) SOLN Inject into the skin continuous. Novolog (up to 60 units as of 02/24/2018)    [provider]  levothyroxine (SYNTHROID, LEVOTHROID) 125 MCG tablet Take 125 mcg by mouth daily before breakfast.  11/01/16   [provider]  lisinopril (PRINIVIL,ZESTRIL) 10 MG tablet take 1 tablet by mouth once daily Patient taking differently: Take 10 mg by mouth daily.  07/28/15   Cassandria Anger, MD  loratadine (CLARITIN) 10 MG tablet Take 10 mg by mouth 2 (two) times daily.     [provider]  metFORMIN (GLUCOPHAGE) 500 MG tablet Take 1 tablet (500 mg total) by mouth 2 (two) times daily with a meal. Do not take 03/23/2018 and 03/24/2018. Restart taking from 03/25/2018. 03/23/18   Modena Jansky, MD  Multiple Vitamins-Minerals (CENTRUM SILVER 50+WOMEN) TABS Take 1 tablet by mouth daily.     [provider]  NOVOLOG 100 UNIT/ML injection Inject into the skin continuous. Via insulin pump as directed up to 60 units (CGM) 02/24/18   [provider]  Omega-3 Fatty Acids (FISH OIL) 1000 MG CAPS Take 1,000 mg by mouth daily.    [provider]  pantoprazole (PROTONIX) 40 MG tablet Take 1 tablet (40 mg total) by mouth 2 (two) times daily before a meal. 03/23/18 04/22/18  Hongalgi, Lenis Dickinson, MD  Polyethylene Glycol 400 (BLINK TEARS) 0.25 % SOLN Apply 1 drop to eye daily as needed (for eye irritation/dryness).    [provider]  topiramate (TOPAMAX) 50 MG tablet TAKE 1 TABLET BY MOUTH ONCE DAILY Patient taking differently: Take 50 mg by mouth every evening.  01/04/18   Pieter Partridge, DO  White Petrolatum-Mineral Oil (GENTEAL TEARS NIGHT-TIME) OINT Apply 1 drop to eye daily as needed (for dryness/irritation).    [provider]    Physical Exam: Vitals:   04/16/18 0052 04/16/18 0130 04/16/18 0140 04/16/18 0200  BP: 116/61 (!) 135/57 (!) 122/59 (!) 137/58  Pulse: 76  77 81  Resp: 20 16 13 17   SpO2: 97%  96% 97%  Weight:      Height:        Constitutional: NAD, calm, comfortable Vitals:   04/16/18 0052 04/16/18 0130 04/16/18 0140 04/16/18 0200  BP: 116/61 (!) 135/57 (!) 122/59 (!) 137/58  Pulse: 76  77 81  Resp: 20 16 13 17   SpO2: 97%  96% 97%  Weight:        Height:       Eyes: lids and conjunctivae normal ENMT: Mucous membranes are moist.  Neck: normal, supple Respiratory: clear to auscultation bilaterally. Normal respiratory effort. No accessory muscle use.  Cardiovascular: Regular rate and rhythm, no murmurs. No extremity edema. Abdomen: no tenderness, no distention. Bowel sounds positive.  Musculoskeletal:  No joint deformity upper and lower extremities.  Patient appears to have tenderness to palpation over the left substernal region.  She denies radiation of the pain. Skin: no rashes, lesions, ulcers.  Psychiatric: Normal judgment and insight. Alert and oriented x 3. Normal mood.   Labs on Admission: I have personally reviewed following labs and imaging studies  CBC: Recent Labs  Lab 04/15/18 2351  WBC 4.6  HGB 11.1*  HCT 34.2*  MCV 90.0  PLT 027   Basic Metabolic Panel: Recent Labs  Lab 04/15/18 2351  NA 138  K 4.0  CL 105  CO2 24  GLUCOSE 209*  BUN 28*  CREATININE 1.27*  CALCIUM 9.3   GFR: Estimated Creatinine Clearance: 65.3 mL/min (A) (by C-G formula based on SCr of 1.27 mg/dL (H)). Liver Function Tests: Recent Labs  Lab 04/15/18 2351  AST 23  ALT 26  ALKPHOS 69  BILITOT 0.6  PROT 7.4  ALBUMIN 3.8   No results for input(s): LIPASE, AMYLASE in the last 168 hours. No results for input(s): AMMONIA in the last 168 hours. Coagulation Profile: No results for input(s): INR, PROTIME in the last 168 hours. Cardiac Enzymes: Recent Labs  Lab 04/15/18 2351  TROPONINI <0.03   BNP (last 3 results) No results for input(s): PROBNP in the last 8760 hours. HbA1C: No results for input(s): HGBA1C in the last 72 hours. CBG: No results for input(s): GLUCAP in the last 168 hours. Lipid Profile: No results for input(s): CHOL, HDL, LDLCALC, TRIG, CHOLHDL, LDLDIRECT in the last 72 hours. Thyroid Function Tests: No results for input(s): TSH, T4TOTAL, FREET4, T3FREE, THYROIDAB in the last 72  hours. Anemia Panel: No  results for input(s): VITAMINB12, FOLATE, FERRITIN, TIBC, IRON, RETICCTPCT in the last 72 hours. Urine analysis: No results found for: COLORURINE, APPEARANCEUR, LABSPEC, PHURINE, GLUCOSEU, HGBUR, BILIRUBINUR, KETONESUR, PROTEINUR, UROBILINOGEN, NITRITE, LEUKOCYTESUR  Radiological Exams on Admission: Dg Chest 2 View  Result Date: 04/16/2018 CLINICAL DATA:  Mid chest pain for 5 hours. Patient had myocardial infarction 3 weeks ago and had stents placed. EXAM: CHEST - 2 VIEW COMPARISON:  None. FINDINGS: The heart size and mediastinal contours are within normal limits. Both lungs are clear. Neural stimulator lead projects over the lower thoracic spine as before. 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 No acute osseous abnormality. IMPRESSION: No active cardiopulmonary disease.  No pulmonary edema. Electronically Signed   By: Ashley Royalty M.D.   On: 04/16/2018 00:34    EKG: Independently reviewed.  Sinus rhythm, 81 bpm with chronic left bundle branch block.  Assessment/Plan Principal Problem:   Chest pain Active Problems:   Type I diabetes mellitus, uncontrolled (H[facility])   Mixed hyperlipidemia   Primary hypothyroidism   Benign hypertension   CAD (coronary artery disease)    1. Atypical chest pain.  This is likely reflective of musculoskeletal pain, however she was just noted to have some unstable angina with significant findings of CAD and stent placement.  Given these facts, I will consult with cardiology in a.m. to ensure that there is no further need for cardiac catheterization with recent stent.  She has been compliant with her aspirin and Plavix otherwise.  She is currently chest pain-free with initial troponin within normal limits.  Will monitor on telemetry and trend troponins with EKG in a.m. 2. CAD with recent proximal LAD stent.  Patient was noted to have unstable angina at that time and we will continue to monitor her during her inpatient stay.  Maintain on aspirin as  well as Plavix with sublingual nitroglycerin as needed for chest pain. 3. Type 1 diabetes with hyperglycemia.  Patient on insulin pump.  Sliding scale insulin for supplemental control.  Maintain on heart healthy/carb modified diet.  Hold metformin for now. 4. Hypothyroidism.  Continue Synthroid. 5. Dyslipidemia.  Continue on atorvastatin and fish oil. 6. Hypertension.  Continue on hydrochlorothiazide and lisinopril. 7. GERD. PPI. 8. History of migraines. Continue Topamax.   DVT prophylaxis: Heparin Code Status: Full Family Communication: None at bedside Disposition Plan:Evaluation of chest pain; r/o ACS Consults called:Cardiology for am Admission status: Observation, telemetry   Jigar Zielke Darleen Crocker DO [facility] Pager [phone]  If 7PM-7AM, please contact night-coverage [email or web address] Password [email or web address]  04/16/2018, 2:15 AM

## 2018-04-16 NOTE — Consult Note (Addendum)
Cardiology Consult    Patient ID: Jodi Ray; [id number]; 07-08-1961   Admit date: 04/15/2018 Date of Consult: 04/16/2018  Primary Care Provider: Monico Blitz, MD Primary Cardiologist: Kate Sable, MD   Patient Profile    Alynn Ellithorpe is a 57 y.o. female with past medical history of CAD (s/p recent DES to Proximal LAD in 02/2018), HTN, HLD, and IDDM who is being seen today for the evaluation of chest pain at the request of Dr. Manuella Ghazi.   History of Present Illness    Ms. Gaubert was recently admitted to [facility] last month for evaluation of chest pain concerning for unstable angina. Cyclic enzymes remained negative and her EKG did show a known left bundle branch block. Given a previously abnormal NST, she was transferred to [facility] for a cardiac catheterization and had PTCA/DES x1 to the proximal LAD. Did have a residual 20% proximal RCA stenosis with medical management recommended. She was started on ASA and Plavix 75 mg daily along with statin therapy.   At the time of her hospital follow-up visit on 03/27/2018, she reported significant improvement in her fatigue since undergoing stent placement and denied any recent chest pain or dyspnea on exertion. Was continued on her current medication regimen at that time.  She presented to [facility] ED on 04/15/2018 for evaluation of chest discomfort which started earlier that evening. Reports being in her usual state of health until yesterday evening when she developed chest discomfort while at a Bible study. She describes this as a "train coming through her chest". Symptoms waxed and waned for over 3 hours prior to spontaneously resolving. She did not have sublingual nitroglycerin on hand to utilize. She reports associated dyspnea and mild diaphoresis at that time but denies any radiating pain, nausea, or vomiting. Earlier this morning at approximately 0700, she developed recurrent chest discomfort which resolved with sublingual  nitroglycerin. She reports that symptoms are similar to when she required stent placement last month but are not as severe.  She denies any active pain at this current time. Reports good compliance with DAPT and denies missing any recent doses.  Initial labs show WBC 4.6, Hgb 11.1, platelets 188, Na+ 138, K+ 4.0, and creatinine 1.27.  Initial and cyclic troponin values have been negative thus far. CXR shows no active cardiopulmonary disease.  EKG shows normal sinus rhythm, HR 81, with known left bundle branch block.  Past Medical History:  Diagnosis Date  . 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 Anemia   . Brain aneurysm    frontal lobe  . CAD (coronary artery disease)    a. 02/2018: s/p DES to Proximal LAD with residual 20% RCA stenosis.  . Chronic pain   . Diabetes mellitus without complication ([place])   . Headache   . History of left bundle branch block (LBBB)   . HTN (hypertension)   . Hx of cardiovascular stress test 10/2016   intermediate risk study  . Hypothyroidism   . Neuromuscular disorder ([place])   . Neuropathy   . Obesity   . OSA on CPAP 03/10/2015  . Retinopathy   . Sleep apnea   . Varicose veins of both lower extremities     Past Surgical History:  Procedure Laterality Date  . BIOPSY  11/14/2016   Procedure: BIOPSY;  Surgeon: Danie Binder, MD;  Location: [facility];  Service: Endoscopy;;  colon gastric duodenal  . BREAST SURGERY Left 1994   Lumpectomy  . COLONOSCOPY WITH PROPOFOL N/A 11/14/2016  Dr. Oneida Alar: Moderately redundant rectosigmoid colon. Random colon biopsies benign. Internal hemorrhoids. Surveillance colonoscopy in 5 years.  . CORONARY STENT INTERVENTION N/A 03/22/2018   Procedure: CORONARY STENT INTERVENTION;  Surgeon: Burnell Blanks, MD;  Location: [place] [facility];  Service: Cardiovascular;  Laterality: N/A;  . ESOPHAGOGASTRODUODENOSCOPY (EGD) WITH PROPOFOL N/A 11/14/2016   Dr. Oneida Alar: Esophagus normal. Moderate gastritis, few gastric polyps. Duodenal  biopsies negative. Fundic gland polyp gastric polyp area no H pylori.  Marland Kitchen FOOT SURGERY    . LEFT HEART CATH AND CORONARY ANGIOGRAPHY N/A 03/22/2018   Procedure: LEFT HEART CATH AND CORONARY ANGIOGRAPHY;  Surgeon: Burnell Blanks, MD;  Location: [place] [facility];  Service: Cardiovascular;  Laterality: N/A;  . LEG SURGERY Right   . REPLACEMENT TOTAL KNEE Right   . SPINAL CORD STIMULATOR IMPLANT       Home Medications:  Prior to Admission medications   Medication Sig Start Date End Date Taking? Authorizing Provider  aspirin EC 81 MG tablet Take 81 mg by mouth daily.    [provider]  atorvastatin (LIPITOR) 80 MG tablet Take 1 tablet (80 mg total) by mouth daily at 6 PM. 04/16/18   Herminio Commons, MD  Bevacizumab (AVASTIN IV) Place into the left eye once.    [provider]  Cholecalciferol (VITAMIN D-3) 5000 units TABS Take 5,000 Units by mouth every 3 (three) days.     [provider]  clopidogrel (PLAVIX) 75 MG tablet Take 1 tablet (75 mg total) by mouth daily with breakfast. 03/27/18   Ahmed Prima, Fransisco Hertz, PA-C  dicyclomine (BENTYL) 10 MG capsule Take 10 mg by mouth 4 (four) times daily -  before meals and at bedtime.    [provider]  Fluocinolone Acetonide (DERMOTIC) 0.01 % OIL Place 1 drop into both ears 3 (three) times a week.     [provider]  gabapentin (NEURONTIN) 400 MG capsule Take 400-800 mg by mouth See admin instructions. 400mg  in the morning and 800mg  at bedtime    [provider]  hydrochlorothiazide (MICROZIDE) 12.5 MG capsule Take 12.5 mg by mouth daily.    [provider]  Insulin Human (INSULIN PUMP) SOLN Inject into the skin continuous. Novolog (up to 60 units as of 02/24/2018)    [provider]  levothyroxine (SYNTHROID, LEVOTHROID) 125 MCG tablet Take 125 mcg by mouth daily before breakfast.  11/01/16   [provider]  lisinopril (PRINIVIL,ZESTRIL) 10 MG tablet take 1 tablet  by mouth once daily Patient taking differently: Take 10 mg by mouth daily.  07/28/15   Cassandria Anger, MD  loratadine (CLARITIN) 10 MG tablet Take 10 mg by mouth 2 (two) times daily.     [provider]  metFORMIN (GLUCOPHAGE) 500 MG tablet Take 1 tablet (500 mg total) by mouth 2 (two) times daily with a meal. Do not take 03/23/2018 and 03/24/2018. Restart taking from 03/25/2018. 03/23/18   Modena Jansky, MD  Multiple Vitamins-Minerals (CENTRUM SILVER 50+WOMEN) TABS Take 1 tablet by mouth daily.     [provider]  NOVOLOG 100 UNIT/ML injection Inject into the skin continuous. Via insulin pump as directed up to 60 units (CGM) 02/24/18   [provider]  Omega-3 Fatty Acids (FISH OIL) 1000 MG CAPS Take 1,000 mg by mouth daily.    [provider]  pantoprazole (PROTONIX) 40 MG tablet Take 1 tablet (40 mg total) by mouth 2 (two) times daily before a meal. 03/23/18 04/22/18  Modena Jansky, MD  Polyethylene Glycol 400 (BLINK TEARS) 0.25 % SOLN Apply 1 drop to eye daily as needed (for eye irritation/dryness).    [provider]  topiramate (TOPAMAX) 50 MG tablet TAKE 1 TABLET BY MOUTH ONCE DAILY Patient taking differently: Take 50 mg by mouth every evening.  01/04/18   Pieter Partridge, DO  White Petrolatum-Mineral Oil (GENTEAL TEARS NIGHT-TIME) OINT Apply 1 drop to eye daily as needed (for dryness/irritation).    [provider]    Inpatient Medications: Scheduled Meds: . acetaminophen      . aspirin EC  81 mg Oral Daily  . atorvastatin  80 mg Oral q1800  . cholecalciferol  5,000 Units Oral Q3 days  . clopidogrel  75 mg Oral Q breakfast  . dicyclomine  10 mg Oral TID AC & HS  . [START ON 04/17/2018] Fluocinolone Acetonide  1 drop Both EARS Once per day on Mon Wed Fri  . gabapentin  400 mg Oral Daily  . gabapentin  800 mg Oral QHS  . heparin  5,000 Units Subcutaneous Q8H  . hydrochlorothiazide  12.5 mg Oral Daily  . insulin aspart  0-5  Units Subcutaneous QHS  . insulin aspart  0-9 Units Subcutaneous TID WC  . levothyroxine  125 mcg Oral QAC breakfast  . lisinopril  10 mg Oral Daily  . loratadine  10 mg Oral BID  . multivitamin with minerals  1 tablet Oral Daily  . omega-3 acid ethyl esters  1,000 mg Oral Daily  . pantoprazole  40 mg Oral BID AC  . sodium chloride flush  3 mL Intravenous Q12H  . topiramate  50 mg Oral QPM   Continuous Infusions: . sodium chloride    . insulin pump     PRN Meds: sodium chloride, acetaminophen **OR** acetaminophen, nitroGLYCERIN, ondansetron **OR** ondansetron (ZOFRAN) IV, polyvinyl alcohol, sodium chloride flush  Allergies:    Allergies  Allergen Reactions  . Fentanyl Nausea And Vomiting and Other (See Comments)    Nausea, vomiting, loss of consciousness, requiring reversal    Social History:   Social History   Socioeconomic History  . Marital status: Widowed    Spouse name: Not on file  . Number of children: Not on file  . Years of education: Not on file  . Highest education level: Not on file  Occupational History  . Not on file  Social Needs  . Financial resource strain: Not on file  . Food insecurity:    Worry: Not on file    Inability: Not on file  . Transportation needs:    Medical: Not on file    Non-medical: Not on file  Tobacco Use  . Smoking status: Never Smoker  . Smokeless tobacco: Never Used  Substance and Sexual Activity  . Alcohol use: No    Alcohol/week: 0.0 standard drinks  . Drug use: No  . Sexual activity: Yes    Partners: Male  Lifestyle  . Physical activity:    Days per week: Not on file    Minutes per session: Not on file  . Stress: Not on file  Relationships  . Social connections:    Talks on phone: Not on file    Gets together: Not on file    Attends religious service: Not on file    Active member of club or organization: Not on file    Attends meetings of clubs or organizations: Not on file    Relationship status: Not on file    .  Intimate partner violence:    Fear of current or ex partner: Not on file    Emotionally abused: Not on file    Physically abused: Not on file    Forced sexual activity: Not on file  Other Topics Concern  . Not on file  Social History Narrative   Drinks about 1-2 cups of caffeine daily.     Family History:    Family History  Problem Relation Age of Onset  . Cancer Mother 26       colon  . Cancer Father        renal cell  . Cancer Brother        bladder  . Cancer Sister        primary brain, and primary breast too  . Cancer Sister        breast   . Cancer Other        multiple aunts with breast, cervical, or colon cancer      Review of Systems    General:  No chills, fever, night sweats or weight changes.  Cardiovascular:  No dyspnea on exertion, edema, orthopnea, palpitations, paroxysmal nocturnal dyspnea. Positive for chest pain.  Dermatological: No rash, lesions/masses Respiratory: No cough, dyspnea Urologic: No hematuria, dysuria Abdominal:   No nausea, vomiting, diarrhea, bright red blood per rectum, melena, or hematemesis Neurologic:  No visual changes, wkns, changes in mental status. All other systems reviewed and are otherwise negative except as noted above.  Physical Exam/Data    Vitals:   04/16/18 0628 04/16/18 0700 04/16/18 0800 04/16/18 0838  BP: (!) 107/52 110/64 (!) 113/53 139/90  Pulse: 77 73 72 85  Resp:  12 14 20   Temp:    97.9 F (36.6 C)  TempSrc:    Oral  SpO2: 96% 92% 96% 98%  Weight:      Height:       No intake or output data in the 24 hours ending 04/16/18 1007 Filed Weights   04/15/18 2254  Weight: 117.5 kg   Body mass index is 38.25 kg/m.   General: Pleasant, Caucasian female appearing in NAD Psych: Normal affect. Neuro: Alert and oriented X 3. Moves all extremities spontaneously. HEENT: Normal  Neck: Supple without bruits or JVD. Lungs:  Resp regular and unlabored, CTA without wheezing or rales. Heart: RRR no s3, s4, or  murmurs. Abdomen: Soft, non-tender, non-distended, BS + x 4.  Extremities: No clubbing, cyanosis or edema. DP/PT/Radials 2+ and equal bilaterally.   EKG:  The EKG was personally reviewed and demonstrates: NSR, HR 81, with known left bundle branch block.   Labs/Studies     Relevant CV Studies:  Cardiac Catheterization: 03/22/2018  Prox RCA lesion is 20% stenosed.  Prox LAD lesion is 80% stenosed.  A drug-eluting stent was successfully placed using a STENT SIERRA 3.00 X 18 MM.  Post intervention, there is a 0% residual stenosis.  The left ventricular systolic function is normal.  LV end diastolic pressure is normal.  The left ventricular ejection fraction is 55-65% by visual estimate.  There is no mitral valve regurgitation.   1. Severe stenosis proximal LAD 2. Successful PTCA/DES x 1 proximal LAD 3. Mild non-obstructive disease in the RCA\ 4. Normal LV systolic function  Recommendations: Continue statin and beta blocker.  Recommend uninterrupted dual antiplatelet therapy with Aspirin 81mg  daily and Clopidogrel 75mg  daily for a minimum of 6 months (stable ischemic heart disease - Class I recommendation). I would complete one year of ASA and Plavix  if she tolerates well.   Laboratory Data:  Chemistry Recent Labs  Lab 04/15/18 2351  NA 138  K 4.0  CL 105  CO2 24  GLUCOSE 209*  BUN 28*  CREATININE 1.27*  CALCIUM 9.3  GFRNONAA 45*  GFRAA 52*  ANIONGAP 9    Recent Labs  Lab 04/15/18 2351  PROT 7.4  ALBUMIN 3.8  AST 23  ALT 26  ALKPHOS 69  BILITOT 0.6   Hematology Recent Labs  Lab 04/15/18 2351  WBC 4.6  RBC 3.80*  HGB 11.1*  HCT 34.2*  MCV 90.0  MCH 29.2  MCHC 32.5  RDW 14.5  PLT 188   Cardiac Enzymes Recent Labs  Lab 04/15/18 2351 04/16/18 0250 04/16/18 0815  TROPONINI <0.03 <0.03 <0.03   No results for input(s): TROPIPOC in the last 168 hours.  BNPNo results for input(s): BNP, PROBNP in the last 168 hours.  DDimer No results for  input(s): DDIMER in the last 168 hours.  Radiology/Studies:  Dg Chest 2 View  Result Date: 04/16/2018 CLINICAL DATA:  Mid chest pain for 5 hours. Patient had myocardial infarction 3 weeks ago and had stents placed. EXAM: CHEST - 2 VIEW COMPARISON:  None. FINDINGS: The heart size and mediastinal contours are within normal limits. Both lungs are clear. Neural stimulator lead projects over the lower thoracic spine as before. 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 No acute osseous abnormality. IMPRESSION: No active cardiopulmonary disease.  No pulmonary edema. Electronically Signed   By: Ashley Royalty M.D.   On: 04/16/2018 00:34     Assessment & Plan    1. Chest Pain with Known CAD - she is s/p DES to the Proximal LAD in 02/2018 and had overall been progressing well until last night when she developed recurrent chest pain which resembled her prior angina. Symptoms lasted for over 3 hours then resolved but she experienced recurrent pain this morning which lasted for 15-20 minutes and resolved with SL NTG. Reports associated dyspnea and diaphoresis. Initially thought to be a MSK component but pain was not reproducible this AM.  - initial and delta troponin values have been negative. Final cyclic values pending. EKG shows normal sinus rhythm, HR 81, with known LBBB. The patient reports compliance with DAPT and denies missing any recent doses and if she had stent failure, we would expect to see elevation of her cardiac enzymes. Will discuss with Dr. Harrington Challenger but if she continues to have chest pain responsive to SL NTG, may need to consider a re-look cath given her recurrent symptoms. The patient is very hesitant about a repeat cath but my concern with noninvasive evaluation is that her NST in 2018 showed significant breast attenuation and this would likely influence imaging again.  - continue ASA, Plavix, and statin therapy.   2. HTN - BP has been well-controlled at 107/52 - 139/90 since  admission.  - continue PTA medication regimen.   3. HLD - no recent FLP on file. Will add-on to AM labs. Remains on Atorvastatin 80mg  daily with goal LDL < 70 with known CAD.  4. IDDM - Hgb A1c at 7.8 on most recent check in 02/2018. Insulin pump in place.     For questions or updates, please contact [place] Please consult [email or web address] for contact info under Cardiology/STEMI.  Signed, Erma Heritage, PA-C 04/16/2018, 10:07 AM Pager: [phone]  Patient seen and examined   I have reviewed above with B Strader Pt is a 57 yo with history of  CAD  SShe is s/ recent PTCA/DES to prox LAD     Mild plaquiing elsewher Presents with CP on 9/16    Pain waxed and waned  Hours  Recurrent today   Similar in quality to previous  angina prior to intervention   She has r/o for MI ON exam:   Pt comfortable without pain    Lungs are CTA   Cardiac RRR   No S3   No murmurs   Ext without edema  I have watched patient walk around floor this afternoon without symptoms Given above I would recomm watching   Nothing to suspect problems with recent stent If pain free overnight and in am with some ambulation would d/c home    If continues then consider myovue  Keep NPO  Dorris Carnes

## 2018-04-17 DIAGNOSIS — R079 Chest pain, unspecified: Secondary | ICD-10-CM | POA: Diagnosis not present

## 2018-04-17 DIAGNOSIS — I25119 Atherosclerotic heart disease of native coronary artery with unspecified angina pectoris: Secondary | ICD-10-CM | POA: Diagnosis not present

## 2018-04-17 LAB — CBC
HCT: 33.5 % — ABNORMAL LOW (ref 36.0–46.0)
Hemoglobin: 10.9 g/dL — ABNORMAL LOW (ref 12.0–15.0)
MCH: 29.1 pg (ref 26.0–34.0)
MCHC: 32.5 g/dL (ref 30.0–36.0)
MCV: 89.3 fL (ref 78.0–100.0)
Platelets: 178 10*3/uL (ref 150–400)
RBC: 3.75 MIL/uL — ABNORMAL LOW (ref 3.87–5.11)
RDW: 14.3 % (ref 11.5–15.5)
WBC: 4.2 10*3/uL (ref 4.0–10.5)

## 2018-04-17 LAB — TROPONIN I

## 2018-04-17 LAB — BASIC METABOLIC PANEL
Anion gap: 8 (ref 5–15)
BUN: 29 mg/dL — ABNORMAL HIGH (ref 6–20)
CALCIUM: 9.1 mg/dL (ref 8.9–10.3)
CO2: 25 mmol/L (ref 22–32)
CREATININE: 1 mg/dL (ref 0.44–1.00)
Chloride: 105 mmol/L (ref 98–111)
GFR calc Af Amer: >60 mL/min (ref >60)
GFR calc non Af Amer: >60 mL/min (ref >60)
Glucose, Bld: 177 mg/dL — ABNORMAL HIGH (ref 70–99)
Potassium: 3.9 mmol/L (ref 3.5–5.1)
Sodium: 138 mmol/L (ref 135–145)

## 2018-04-17 LAB — GLUCOSE, CAPILLARY: Glucose-Capillary: 196 mg/dL — ABNORMAL HIGH (ref 70–99)

## 2018-04-17 LAB — HIV ANTIBODY (ROUTINE TESTING W REFLEX): HIV Screen 4th Generation wRfx: NONREACTIVE

## 2018-04-17 MED ORDER — NITROGLYCERIN 0.4 MG SL SUBL: 0.4000 mg | SUBLINGUAL_TABLET | SUBLINGUAL | 3 refills | Status: DC | PRN

## 2018-04-17 NOTE — Progress Notes (Addendum)
Progress Note  Patient Name: Jodi Ray Date of Encounter: 04/17/2018  Primary Cardiologist: Kate Sable, MD   Subjective   She denies any recurrent chest pain overnight or this morning. Has ambulated the hallway multiple times this morning without recurrent chest pain. No dyspnea or palpitations. Anxious to go home.   Inpatient Medications    Scheduled Meds: . aspirin EC  81 mg Oral Daily  . atorvastatin  80 mg Oral q1800  . cholecalciferol  5,000 Units Oral Q3 days  . clopidogrel  75 mg Oral Q breakfast  . dicyclomine  10 mg Oral TID AC & HS  . Fluocinolone Acetonide  1 drop Both EARS Once per day on Mon Wed Fri  . gabapentin  400 mg Oral Daily  . gabapentin  800 mg Oral QHS  . heparin  5,000 Units Subcutaneous Q8H  . hydrochlorothiazide  12.5 mg Oral Daily  . insulin pump   Subcutaneous TID AC, HS, 0200  . levothyroxine  125 mcg Oral QAC breakfast  . lisinopril  10 mg Oral Daily  . loratadine  10 mg Oral BID  . multivitamin with minerals  1 tablet Oral Daily  . omega-3 acid ethyl esters  1,000 mg Oral Daily  . pantoprazole  40 mg Oral BID AC  . sodium chloride flush  3 mL Intravenous Q12H  . topiramate  50 mg Oral QPM   Continuous Infusions: . sodium chloride    . insulin pump     PRN Meds: sodium chloride, acetaminophen **OR** acetaminophen, nitroGLYCERIN, ondansetron **OR** ondansetron (ZOFRAN) IV, polyvinyl alcohol, sodium chloride flush   Vital Signs    Vitals:   04/16/18 2059 04/16/18 2122 04/16/18 2210 04/17/18 0620  BP:  (!) 99/56 102/64 (!) 108/56  Pulse:  73  88  Resp:  17  18  Temp:  98.2 F (36.8 C)  97.8 F (36.6 C)  TempSrc:  Oral  Oral  SpO2: 95% 97%  97%  Weight:      Height:        Intake/Output Summary (Last 24 hours) at 04/17/2018 0740 Last data filed at 04/16/2018 1600 Gross per 24 hour  Intake 240 ml  Output 900 ml  Net -660 ml   Filed Weights   04/15/18 2254  Weight: 117.5 kg    Telemetry    NSR, HR in 70's to  80's. No acute events.  - Personally Reviewed  ECG    NSR, HR 81, with known LBBB. - Personally Reviewed  Physical Exam   General: Well developed, well nourished Caucasian female appearing in no acute distress. Head: Normocephalic, atraumatic.  Neck: Supple without bruits, JVD not elevated. Lungs:  Resp regular and unlabored, CTA without wheezing or rales. Heart: RRR, S1, S2, no S3, S4, or murmur; no rub. Abdomen: Soft, non-tender, non-distended with normoactive bowel sounds. No hepatomegaly. No rebound/guarding. No obvious abdominal masses. Extremities: No clubbing, cyanosis, or lower extremity edema. Distal pedal pulses are 2+ bilaterally. Neuro: Alert and oriented X 3. Moves all extremities spontaneously. Psych: Normal affect.  Labs    Chemistry Recent Labs  Lab 04/15/18 2351 04/17/18 0547  NA 138 138  K 4.0 3.9  CL 105 105  CO2 24 25  GLUCOSE 209* 177*  BUN 28* 29*  CREATININE 1.27* 1.00  CALCIUM 9.3 9.1  PROT 7.4  --   ALBUMIN 3.8  --   AST 23  --   ALT 26  --   ALKPHOS 69  --   BILITOT 0.6  --  GFRNONAA 45* >60  GFRAA 52* >60  ANIONGAP 9 8     Hematology Recent Labs  Lab 04/15/18 2351 04/17/18 0547  WBC 4.6 4.2  RBC 3.80* 3.75*  HGB 11.1* 10.9*  HCT 34.2* 33.5*  MCV 90.0 89.3  MCH 29.2 29.1  MCHC 32.5 32.5  RDW 14.5 14.3  PLT 188 178    Cardiac Enzymes Recent Labs  Lab 04/16/18 0250 04/16/18 0815 04/16/18 1517 04/17/18 0547  TROPONINI <0.03 <0.03 <0.03 <0.03   No results for input(s): TROPIPOC in the last 168 hours.   BNPNo results for input(s): BNP, PROBNP in the last 168 hours.   DDimer No results for input(s): DDIMER in the last 168 hours.   Radiology    Dg Chest 2 View  Result Date: 04/16/2018 CLINICAL DATA:  Mid chest pain for 5 hours. Patient had myocardial infarction 3 weeks ago and had stents placed. EXAM: CHEST - 2 VIEW COMPARISON:  None. FINDINGS: The heart size and mediastinal contours are within normal limits. Both lungs  are clear. Neural stimulator lead projects over the lower thoracic spine as before. 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 No acute osseous abnormality. IMPRESSION: No active cardiopulmonary disease.  No pulmonary edema. Electronically Signed   By: Ashley Royalty M.D.   On: 04/16/2018 00:34    Cardiac Studies   Cardiac Catheterization: 03/22/2018  Prox RCA lesion is 20% stenosed.  Prox LAD lesion is 80% stenosed.  A drug-eluting stent was successfully placed using a STENT SIERRA 3.00 X 18 MM.  Post intervention, there is a 0% residual stenosis.  The left ventricular systolic function is normal.  LV end diastolic pressure is normal.  The left ventricular ejection fraction is 55-65% by visual estimate.  There is no mitral valve regurgitation.   1. Severe stenosis proximal LAD 2. Successful PTCA/DES x 1 proximal LAD 3. Mild non-obstructive disease in the RCA\ 4. Normal LV systolic function  Recommendations: Continue statin and beta blocker.  Recommend uninterrupted dual antiplatelet therapy with Aspirin 81mg  daily and Clopidogrel 75mg  daily for a minimum of 6 months (stable ischemic heart disease - Class I recommendation). I would complete one year of ASA and Plavix if she tolerates well.   Patient Profile     57 y.o. female with past medical history of CAD (s/p recent DES to Proximal LAD in 02/2018), HTN, HLD, and IDDM who presented to [facility] ED on 04/15/2018 for evaluation of recurrent chest pain.   Assessment & Plan    1. Chest Pain with Known CAD - she is s/p DES to the Proximal LAD in 02/2018 and had overall been progressing well until the night prior to admission when she developed recurrent chest pain which resembled her prior angina. Symptoms lasted for over 3 hours then resolved spontaneously but she experienced recurrent pain on 9/17 which lasted for 15-20 minutes and resolved with SL NTG.  - initial and cyclic troponin values have been negative  this admission including repeat values this morning. Stent closure felt to be very unlikely given her negative enzymes and no recurrent pain since admission. Ambulated yesterday evening and this morning without recurrent anginal symptoms. No indication for further testing at this time. Will provide Rx for SL NTG at the time of discharge as she does not have this at home. If she has recurrent symptoms, would consider the addition of low-dose BB or Imdur for antianginal benefit. Has been continued on PPI therapy for GERD.  - continue ASA, Plavix, and  statin therapy.   2. HTN - BP has been well-controlled since admission.  - continue PTA regimen of Lisinopril and HCTZ.   3. HLD - FLP this admission shows total cholesterol of 134, HDL 75, and LDL 54. At goal of LDL< 70. Remains on Atorvastatin 80mg  daily.  4. IDDM - Hgb A1c at 7.8 on most recent check in 02/2018.  - using Insulin pump. Glucose 177 this AM.    For questions or updates, please contact [place] Please consult [email or web address] for contact info under Cardiology/STEMI.   Arna Medici , PA-C 7:40 AM 04/17/2018 Pager: [phone]   Attending note:  Patient seen and examined.  I reviewed the recent consultation note from Dr. Harrington Challenger and follow-up by Ms. Strader PA-C.  I agree with her findings and recommendations.  Ms. Lemar has ruled out for ACS, ambulating in the hall without recurrent chest pain.  Stent thrombosis is very unlikely in the setting.  Would continue with medical therapy, add PRN nitroglycerin which she did not have a prescription for at baseline.  Patient stable for discharge home today.  She does have a visit with Dr. Bronson Ing later this year, could move this visit up if she continues to have angina symptoms requiring nitroglycerin.  [facility] will sign off.   Medication Recommendations: Continue current cardiac medical regimen with addition of as needed nitroglycerin. Other  recommendations (labs, testing, etc): No additional cardiac testing planned at this time as an inpatient. Follow up as an outpatient: Keep scheduled follow-up with Dr. Bronson Ing, can move visit up if she has recurring angina symptoms on medical therapy.  Satira Sark, M.D., F.A.C.C.

## 2018-04-17 NOTE — Progress Notes (Signed)
IV removed, 2x2 gauze and paper tape applied to site, patient tolerated well. Reviewed AVS with patient who verbalized understanding.  Patient refused to be taken downstairs by wheelchair, patient walked to lobby by [place], NT.  Patient to be transported home by her boyfriend.

## 2018-04-17 NOTE — Discharge Summary (Signed)
Physician Discharge Summary  Jodi Ray ATF:[id number] DOB: 1960/12/02 DOA: 04/15/2018  PCP: Monico Blitz, MD  Admit date: 04/15/2018 Discharge date: 04/17/2018  Time spent: 45 minutes  Recommendations for Outpatient Follow-up:  -To be discharged home today. -We will follow-up with cardiology as scheduled.  Discharge Diagnoses:  Principal Problem:   Chest pain Active Problems:   Type I diabetes mellitus, uncontrolled ([place])   Mixed hyperlipidemia   Primary hypothyroidism   Benign hypertension   CAD (coronary artery disease)   Discharge Condition: Stable and improved  Filed Weights   04/15/18 2254  Weight: 117.5 kg    History of present illness:  As per Dr. Manuella Ghazi on 9/17: Jodi Ray is a 57 y.o. female with medical history significant for CAD with recent stent to LAD 3 weeks ago, type 1 diabetes on insulin pump, hypertension, dyslipidemia, hypothyroidism, and obesity who presented to the ED today after she began experiencing sudden onset, left-sided substernal chest pain that began as she was sitting during Bible study at approximately 7:30 PM.  She states that the pain felt like a "freight train."  She denies any radiation of the pain and did not have any associated dyspnea, unlike 3 weeks ago when she had her anginal symptoms.  She denies any diaphoresis, nausea, vomiting, palpitations, or other symptomatology.  She had no lightheadedness or dizziness.  She states that the symptoms persisted for approximately 2 to 3 hours, but the pain was most intense in the first hour.  She appears to have no particular aggravating or alleviating factors noted.  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   ED Course: Patient's pain had improved significantly by the time she arrived to the ED and her vitals were noted to be stable.  Her initial troponin was less than 0.03  and her EKG demonstrated her chronic left bundle branch block.  Hemoglobin was 11.1 and stable.  Creatinine is mildly elevated at 1.27 whereas baseline is typically near 1.1.  Glucose is noted to be 209.  She has received a full dose aspirin and Nitropaste and was otherwise pain-free even prior to the Nitropaste.  EDP had spoken with Dr. Aundra Dubin of cardiology who states that patient could remain here for chest pain rule out and be seen by cardiology in a.m.  Hospital Course:   Atypical chest pain -The patient just had PTCA 3 weeks ago. -Has been seen in consultation by cardiology, she has ruled out for ACS, EKG without acute ischemic changes. -She has remained chest pain-free since admission. -Cardiology is not recommending any further work-up at this time.  Rest of chronic medical conditions have been stable during this short hospitalization  Procedures:  None  Consultations:  Cardiology  Discharge Instructions  Discharge Instructions    Diet - low sodium heart healthy   Complete by:  As directed    Increase activity slowly   Complete by:  As directed      Allergies as of 04/17/2018      Reactions   Fentanyl Nausea And Vomiting, Other (See Comments)   Nausea, vomiting, loss of consciousness, requiring reversal      Medication List    STOP taking these medications   AVASTIN IV     TAKE these medications   aspirin EC 81 MG tablet Take 81 mg by mouth daily.   atorvastatin 80 MG tablet Commonly known as:  LIPITOR Take 1 tablet (80 mg total) by mouth daily at 6 PM.   BLINK TEARS 0.25 % Soln Generic drug:  Polyethylene Glycol 400 Apply 1 drop to eye daily as needed (for eye irritation/dryness).   CENTRUM SILVER 50+WOMEN Tabs Take 1 tablet by mouth daily.   clopidogrel 75 MG tablet Commonly known as:  PLAVIX Take 1 tablet (75 mg total) by mouth daily with breakfast.   DERMOTIC 0.01 % Oil Generic drug:  Fluocinolone Acetonide Place 1 drop into both ears 3 (three)  times a week.   dicyclomine 10 MG capsule Commonly known as:  BENTYL Take 10 mg by mouth 4 (four) times daily -  before meals and at bedtime.   Fish Oil 1000 MG Caps Take 1,000 mg by mouth daily.   gabapentin 400 MG capsule Commonly known as:  NEURONTIN Take 400-800 mg by mouth See admin instructions. 400mg  in the morning and 800mg  at bedtime   GENTEAL TEARS NIGHT-TIME Oint Apply 1 drop to eye daily as needed (for dryness/irritation).   hydrochlorothiazide 12.5 MG capsule Commonly known as:  MICROZIDE Take 12.5 mg by mouth daily.   insulin pump Soln Inject into the skin continuous. Novolog (up to 60 units as of 02/24/2018)   levothyroxine 125 MCG tablet Commonly known as:  SYNTHROID, LEVOTHROID Take 125 mcg by mouth daily before breakfast.   lisinopril 10 MG tablet Commonly known as:  PRINIVIL,ZESTRIL take 1 tablet by mouth once daily   loratadine 10 MG tablet Commonly known as:  CLARITIN Take 10 mg by mouth 2 (two) times daily.   metFORMIN 500 MG tablet Commonly known as:  GLUCOPHAGE Take 1 tablet (500 mg total) by mouth 2 (two) times daily with a meal. Do not take 03/23/2018 and 03/24/2018. Restart taking from 03/25/2018.   nitroGLYCERIN 0.4 MG SL tablet Commonly known as:  NITROSTAT Place 1 tablet (0.4 mg total) under the tongue every 5 (five) minutes as needed for chest pain.   NOVOLOG 100 UNIT/ML injection Generic drug:  insulin aspart Inject into the skin continuous. Via insulin pump as directed up to 60 units (CGM)   pantoprazole 40 MG tablet Commonly known as:  PROTONIX Take 1 tablet (40 mg total) by mouth 2 (two) times daily before a meal.   topiramate 50 MG tablet Commonly known as:  TOPAMAX TAKE 1 TABLET BY MOUTH ONCE DAILY What changed:  when to take this   Vitamin D-3 5000 units Tabs Take 5,000 Units by mouth every 3 (three) days.      Allergies  Allergen Reactions  . Fentanyl Nausea And Vomiting and Other (See Comments)    Nausea, vomiting,  loss of consciousness, requiring reversal   Follow-up Information    Monico Blitz, MD. Schedule an appointment as soon as possible for a visit in 2 week(s).   Specialty:  Internal Medicine Contact information: [place] [address] [phone]        Herminio Commons, MD .   Specialty:  Cardiology Contact information: [place] [address] [phone]            The results of significant diagnostics from this hospitalization (including imaging, microbiology, ancillary and laboratory) are listed below for reference.    Significant Diagnostic Studies: Dg Chest 2 View  Result Date: 04/16/2018 CLINICAL DATA:  Mid chest pain for 5 hours. Patient had myocardial infarction 3 weeks ago and had stents placed. EXAM: CHEST - 2 VIEW COMPARISON:  None. FINDINGS: The heart  size and mediastinal contours are within normal limits. Both lungs are clear. Neural stimulator lead projects over the lower thoracic spine as before. 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 No acute osseous abnormality. IMPRESSION: No active cardiopulmonary disease.  No pulmonary edema. Electronically Signed   By: Ashley Royalty M.D.   On: 04/16/2018 00:34   Dg Chest 2 View  Result Date: 03/20/2018 CLINICAL DATA:  Chest pain extending to LEFT shoulder, shortness of breath, diaphoresis EXAM: CHEST - 2 VIEW COMPARISON:  07/21/2017 FINDINGS: Normal heart size, mediastinal contours, and pulmonary vascularity. Mild chronic bronchitic changes. No acute infiltrate, pleural effusion or pneumothorax. Intraspinal stimulator noted. No acute osseous findings. IMPRESSION: Mild chronic bronchitic changes without infiltrate. Electronically Signed   By: Lavonia Dana M.D.   On: 03/20/2018 17:45    Microbiology: No results found for this or any previous visit (from the past 240 hour(s)).   Labs: Basic Metabolic Panel: Recent Labs  Lab 04/15/18 2351 04/17/18 0547  NA 138 138  K 4.0 3.9   CL 105 105  CO2 24 25  GLUCOSE 209* 177*  BUN 28* 29*  CREATININE 1.27* 1.00  CALCIUM 9.3 9.1   Liver Function Tests: Recent Labs  Lab 04/15/18 2351  AST 23  ALT 26  ALKPHOS 69  BILITOT 0.6  PROT 7.4  ALBUMIN 3.8   No results for input(s): LIPASE, AMYLASE in the last 168 hours. No results for input(s): AMMONIA in the last 168 hours. CBC: Recent Labs  Lab 04/15/18 2351 04/17/18 0547  WBC 4.6 4.2  HGB 11.1* 10.9*  HCT 34.2* 33.5*  MCV 90.0 89.3  PLT 188 178   Cardiac Enzymes: Recent Labs  Lab 04/15/18 2351 04/16/18 0250 04/16/18 0815 04/16/18 1517 04/17/18 0547  TROPONINI <0.03 <0.03 <0.03 <0.03 <0.03   BNP: BNP (last 3 results) No results for input(s): BNP in the last 8760 hours.  ProBNP (last 3 results) No results for input(s): PROBNP in the last 8760 hours.  CBG: Recent Labs  Lab 04/16/18 0845 04/16/18 1127 04/16/18 1622 04/16/18 2142 04/17/18 0748  GLUCAP 107* 195* 108* 128* 196*       Signed:  Lelon Frohlich  [facility] Pager: [phone] 04/17/2018, 11:27 AM

## 2018-04-24 DIAGNOSIS — J301 Allergic rhinitis due to pollen: Secondary | ICD-10-CM | POA: Diagnosis not present

## 2018-04-24 DIAGNOSIS — J3081 Allergic rhinitis due to animal (cat) (dog) hair and dander: Secondary | ICD-10-CM | POA: Diagnosis not present

## 2018-04-24 DIAGNOSIS — H1045 Other chronic allergic conjunctivitis: Secondary | ICD-10-CM | POA: Diagnosis not present

## 2018-04-24 DIAGNOSIS — J3089 Other allergic rhinitis: Secondary | ICD-10-CM | POA: Diagnosis not present

## 2018-04-29 DIAGNOSIS — J3089 Other allergic rhinitis: Secondary | ICD-10-CM | POA: Diagnosis not present

## 2018-04-29 DIAGNOSIS — J3081 Allergic rhinitis due to animal (cat) (dog) hair and dander: Secondary | ICD-10-CM | POA: Diagnosis not present

## 2018-04-29 DIAGNOSIS — J301 Allergic rhinitis due to pollen: Secondary | ICD-10-CM | POA: Diagnosis not present

## 2018-05-02 DIAGNOSIS — Z299 Encounter for prophylactic measures, unspecified: Secondary | ICD-10-CM | POA: Diagnosis not present

## 2018-05-02 DIAGNOSIS — Z789 Other specified health status: Secondary | ICD-10-CM | POA: Diagnosis not present

## 2018-05-02 DIAGNOSIS — K219 Gastro-esophageal reflux disease without esophagitis: Secondary | ICD-10-CM | POA: Diagnosis not present

## 2018-05-02 DIAGNOSIS — M14679 Charcot's joint, unspecified ankle and foot: Secondary | ICD-10-CM | POA: Diagnosis not present

## 2018-05-02 DIAGNOSIS — Z6839 Body mass index (BMI) 39.0-39.9, adult: Secondary | ICD-10-CM | POA: Diagnosis not present

## 2018-05-02 DIAGNOSIS — Z23 Encounter for immunization: Secondary | ICD-10-CM | POA: Diagnosis not present

## 2018-05-02 DIAGNOSIS — I2510 Atherosclerotic heart disease of native coronary artery without angina pectoris: Secondary | ICD-10-CM | POA: Diagnosis not present

## 2018-05-02 DIAGNOSIS — I10 Essential (primary) hypertension: Secondary | ICD-10-CM | POA: Diagnosis not present

## 2018-05-02 DIAGNOSIS — E1165 Type 2 diabetes mellitus with hyperglycemia: Secondary | ICD-10-CM | POA: Diagnosis not present

## 2018-05-06 ENCOUNTER — Ambulatory Visit (INDEPENDENT_AMBULATORY_CARE_PROVIDER_SITE_OTHER): Payer: Medicare Other | Admitting: Otolaryngology

## 2018-05-06 DIAGNOSIS — H6123 Impacted cerumen, bilateral: Secondary | ICD-10-CM | POA: Diagnosis not present

## 2018-05-09 DIAGNOSIS — E113512 Type 2 diabetes mellitus with proliferative diabetic retinopathy with macular edema, left eye: Secondary | ICD-10-CM | POA: Diagnosis not present

## 2018-05-23 ENCOUNTER — Telehealth: Payer: Self-pay | Admitting: Cardiovascular Disease

## 2018-05-23 NOTE — Telephone Encounter (Signed)
Pt aware that she was to have L/L's done prior to December appt - pt will go to [facility] about a week before

## 2018-05-23 NOTE — Telephone Encounter (Signed)
LM to return call - [place] pt

## 2018-05-23 NOTE — Telephone Encounter (Signed)
Patient has upcoming appointment with Dr. Bronson Ing in December . She was told that she needs to have labs done before appointment.   [phone].

## 2018-05-27 DIAGNOSIS — J3081 Allergic rhinitis due to animal (cat) (dog) hair and dander: Secondary | ICD-10-CM | POA: Diagnosis not present

## 2018-05-27 DIAGNOSIS — I10 Essential (primary) hypertension: Secondary | ICD-10-CM | POA: Diagnosis not present

## 2018-05-27 DIAGNOSIS — J301 Allergic rhinitis due to pollen: Secondary | ICD-10-CM | POA: Diagnosis not present

## 2018-05-27 DIAGNOSIS — J3089 Other allergic rhinitis: Secondary | ICD-10-CM | POA: Diagnosis not present

## 2018-05-27 DIAGNOSIS — E039 Hypothyroidism, unspecified: Secondary | ICD-10-CM | POA: Diagnosis not present

## 2018-05-27 DIAGNOSIS — E109 Type 1 diabetes mellitus without complications: Secondary | ICD-10-CM | POA: Diagnosis not present

## 2018-05-29 DIAGNOSIS — J3089 Other allergic rhinitis: Secondary | ICD-10-CM | POA: Diagnosis not present

## 2018-05-29 DIAGNOSIS — J301 Allergic rhinitis due to pollen: Secondary | ICD-10-CM | POA: Diagnosis not present

## 2018-05-29 DIAGNOSIS — J3081 Allergic rhinitis due to animal (cat) (dog) hair and dander: Secondary | ICD-10-CM | POA: Diagnosis not present

## 2018-05-31 DIAGNOSIS — J301 Allergic rhinitis due to pollen: Secondary | ICD-10-CM | POA: Diagnosis not present

## 2018-05-31 DIAGNOSIS — J3089 Other allergic rhinitis: Secondary | ICD-10-CM | POA: Diagnosis not present

## 2018-05-31 DIAGNOSIS — J3081 Allergic rhinitis due to animal (cat) (dog) hair and dander: Secondary | ICD-10-CM | POA: Diagnosis not present

## 2018-06-03 DIAGNOSIS — E1151 Type 2 diabetes mellitus with diabetic peripheral angiopathy without gangrene: Secondary | ICD-10-CM | POA: Diagnosis not present

## 2018-06-03 DIAGNOSIS — J3089 Other allergic rhinitis: Secondary | ICD-10-CM | POA: Diagnosis not present

## 2018-06-03 DIAGNOSIS — J3081 Allergic rhinitis due to animal (cat) (dog) hair and dander: Secondary | ICD-10-CM | POA: Diagnosis not present

## 2018-06-03 DIAGNOSIS — J301 Allergic rhinitis due to pollen: Secondary | ICD-10-CM | POA: Diagnosis not present

## 2018-06-03 DIAGNOSIS — E1140 Type 2 diabetes mellitus with diabetic neuropathy, unspecified: Secondary | ICD-10-CM | POA: Diagnosis not present

## 2018-06-04 DIAGNOSIS — Z9641 Presence of insulin pump (external) (internal): Secondary | ICD-10-CM | POA: Diagnosis not present

## 2018-06-04 DIAGNOSIS — I2510 Atherosclerotic heart disease of native coronary artery without angina pectoris: Secondary | ICD-10-CM | POA: Diagnosis not present

## 2018-06-04 DIAGNOSIS — E039 Hypothyroidism, unspecified: Secondary | ICD-10-CM | POA: Diagnosis not present

## 2018-06-04 DIAGNOSIS — E109 Type 1 diabetes mellitus without complications: Secondary | ICD-10-CM | POA: Diagnosis not present

## 2018-06-04 DIAGNOSIS — I10 Essential (primary) hypertension: Secondary | ICD-10-CM | POA: Diagnosis not present

## 2018-06-04 DIAGNOSIS — E1140 Type 2 diabetes mellitus with diabetic neuropathy, unspecified: Secondary | ICD-10-CM | POA: Diagnosis not present

## 2018-06-05 DIAGNOSIS — J301 Allergic rhinitis due to pollen: Secondary | ICD-10-CM | POA: Diagnosis not present

## 2018-06-05 DIAGNOSIS — J3081 Allergic rhinitis due to animal (cat) (dog) hair and dander: Secondary | ICD-10-CM | POA: Diagnosis not present

## 2018-06-05 DIAGNOSIS — J3089 Other allergic rhinitis: Secondary | ICD-10-CM | POA: Diagnosis not present

## 2018-06-07 DIAGNOSIS — Z79899 Other long term (current) drug therapy: Secondary | ICD-10-CM | POA: Diagnosis not present

## 2018-06-07 DIAGNOSIS — J3081 Allergic rhinitis due to animal (cat) (dog) hair and dander: Secondary | ICD-10-CM | POA: Diagnosis not present

## 2018-06-07 DIAGNOSIS — D509 Iron deficiency anemia, unspecified: Secondary | ICD-10-CM | POA: Diagnosis not present

## 2018-06-07 DIAGNOSIS — J3089 Other allergic rhinitis: Secondary | ICD-10-CM | POA: Diagnosis not present

## 2018-06-07 DIAGNOSIS — I129 Hypertensive chronic kidney disease with stage 1 through stage 4 chronic kidney disease, or unspecified chronic kidney disease: Secondary | ICD-10-CM | POA: Diagnosis not present

## 2018-06-07 DIAGNOSIS — R809 Proteinuria, unspecified: Secondary | ICD-10-CM | POA: Diagnosis not present

## 2018-06-07 DIAGNOSIS — E559 Vitamin D deficiency, unspecified: Secondary | ICD-10-CM | POA: Diagnosis not present

## 2018-06-07 DIAGNOSIS — N183 Chronic kidney disease, stage 3 (moderate): Secondary | ICD-10-CM | POA: Diagnosis not present

## 2018-06-07 DIAGNOSIS — J301 Allergic rhinitis due to pollen: Secondary | ICD-10-CM | POA: Diagnosis not present

## 2018-06-14 DIAGNOSIS — J3081 Allergic rhinitis due to animal (cat) (dog) hair and dander: Secondary | ICD-10-CM | POA: Diagnosis not present

## 2018-06-14 DIAGNOSIS — J3089 Other allergic rhinitis: Secondary | ICD-10-CM | POA: Diagnosis not present

## 2018-06-14 DIAGNOSIS — J301 Allergic rhinitis due to pollen: Secondary | ICD-10-CM | POA: Diagnosis not present

## 2018-06-17 ENCOUNTER — Encounter (HOSPITAL_COMMUNITY): Payer: Self-pay

## 2018-06-17 ENCOUNTER — Encounter (HOSPITAL_COMMUNITY)
Admission: RE | Admit: 2018-06-17 | Discharge: 2018-06-17 | Disposition: A | Discharge status: Home / Self Care | Payer: Medicare Other | Source: Ambulatory Visit | Attending: Cardiovascular Disease | Admitting: Cardiovascular Disease

## 2018-06-17 VITALS — BP 116/60 | HR 94 | Ht 68.0 in | Wt 261.0 lb

## 2018-06-17 DIAGNOSIS — Z955 Presence of coronary angioplasty implant and graft: Secondary | ICD-10-CM | POA: Diagnosis not present

## 2018-06-17 DIAGNOSIS — J3089 Other allergic rhinitis: Secondary | ICD-10-CM | POA: Diagnosis not present

## 2018-06-17 DIAGNOSIS — J301 Allergic rhinitis due to pollen: Secondary | ICD-10-CM | POA: Diagnosis not present

## 2018-06-17 DIAGNOSIS — J3081 Allergic rhinitis due to animal (cat) (dog) hair and dander: Secondary | ICD-10-CM | POA: Diagnosis not present

## 2018-06-17 NOTE — Progress Notes (Signed)
Daily Session Note  Patient Details  Name: Jodi Ray MRN: [id number] Date of Birth: Apr 06, 1961 Referring Provider:     [place] from 06/17/2018 in [place]  Referring Provider  Bronson Ing      Encounter Date: 06/17/2018  Check In: Session Check In - 06/17/18 1518      Check-In   Supervising physician immediately available to respond to emergencies  See telemetry face sheet for immediately available MD    Location  [facility]    Staff Present  Aundra Dubin, RN, BSN;Daveon Arpino, MS, EP, [facility], Exercise Physiologist;Amanda Zachery Conch, Exercise Physiologist    Medication changes reported      No    Fall or balance concerns reported     No    Tobacco Cessation  --   Never Smoked   Warm-up and Cool-down  Performed as group-led instruction    Resistance Training Performed  Yes    VAD Patient?  No    PAD/SET Patient?  No      Pain Assessment   Currently in Pain?  No/denies    Pain Score  0-No pain    Multiple Pain Sites  No       Capillary Blood Glucose: No results found for this or any previous visit (from the past 24 hour(s)).    Social History   Tobacco Use  Smoking Status Never Smoker  Smokeless Tobacco Never Used    Goals Met:  Independence with exercise equipment Exercise tolerated well Personal goals reviewed No report of cardiac concerns or symptoms Strength training completed today  Goals Unmet:  Not Applicable  Comments: Check out: 1500   Dr. Kate Sable is Medical Director for [place] and Pulmonary Rehab.

## 2018-06-17 NOTE — Progress Notes (Signed)
Cardiac/Pulmonary Rehab Medication Review by a Pharmacist  Does the patient  feel that his/her medications are working for him/her?  yes  Has the patient been experiencing any side effects to the medications prescribed?  no  Does the patient measure his/her own blood pressure or blood glucose at home?  yes   Does the patient have any problems obtaining medications due to transportation or finances?   no  Understanding of regimen: excellent Understanding of indications: excellent Potential of compliance: excellent  Questions asked to Determine Patient Understanding of Medication Regimen:  1. What is the name of the medication?  2. What is the medication used for?  3. When should it be taken?  4. How much should be taken?  5. How will you take it?  6. What side effects should you report?  Understanding Defined as: Excellent: All questions above are correct Good: Questions 1-4 are correct Fair: Questions 1-2 are correct  Poor: 1 or none of the above questions are correct   Pharmacist comments: Patient presents today for cardiac rehab. I reviewed the medications with the patient. The patient is compliant and very aware of current regimen. She is monitoring her BS. We discussed gabapentin which she is currently taking 1200mg  at night but still having pain during the day. Suggested she take 800mg  at bedtime and 400mg  every morning to see if that might help since she is not on extended release and same total dose. Otherwise she is tolerating her current regimen.   Thanks for the opportunity to participate in the care of this patient,  Isac Sarna, BS [place], [place] Clinical Pharmacist Pager [phone] 06/17/2018 2:06 PM

## 2018-06-17 NOTE — Progress Notes (Signed)
Cardiac Individual Treatment Plan  Patient Details  Name: Jodi Ray MRN: [id number] Date of Birth: [birth year] Referring Provider:     CARDIAC REHAB PHASE II EXERCISE from 06/17/2018 in [place]  Referring Provider  Bronson Ing      Initial Encounter Date:    CARDIAC REHAB PHASE II EXERCISE from 06/17/2018 in [place]  Date  06/17/18      Visit Diagnosis: Status post coronary artery stent placement  Patient's Home Medications on Admission:  Current Outpatient Medications:  .  levocetirizine (XYZAL) 5 MG tablet, Take 5 mg by mouth daily with breakfast., Disp: , Rfl:  .  aspirin EC 81 MG tablet, Take 81 mg by mouth daily., Disp: , Rfl:  .  atorvastatin (LIPITOR) 80 MG tablet, Take 1 tablet (80 mg total) by mouth daily at 6 PM., Disp: 90 tablet, Rfl: 3 .  Cholecalciferol (VITAMIN D-3) 5000 units TABS, Take 5,000 Units by mouth every 3 (three) days. , Disp: , Rfl:  .  clopidogrel (PLAVIX) 75 MG tablet, Take 1 tablet (75 mg total) by mouth daily with breakfast., Disp: 90 tablet, Rfl: 3 .  dicyclomine (BENTYL) 10 MG capsule, Take 10 mg by mouth 4 (four) times daily -  before meals and at bedtime., Disp: , Rfl:  .  EPINEPHrine 0.3 mg/0.3 mL IJ SOAJ injection, Inject 0.3 mg into the muscle as needed., Disp: , Rfl: 1 .  Fluocinolone Acetonide (DERMOTIC) 0.01 % OIL, Place 1 drop into both ears 3 (three) times a week. , Disp: , Rfl:  .  gabapentin (NEURONTIN) 400 MG capsule, Take 1,200 mg by mouth at bedtime. , Disp: , Rfl:  .  hydrochlorothiazide (MICROZIDE) 12.5 MG capsule, Take 12.5 mg by mouth daily., Disp: , Rfl:  .  Insulin Human (INSULIN PUMP) SOLN, Inject into the skin continuous. Novolog (up to 60 units as of 02/24/2018), Disp: , Rfl:  .  levothyroxine (SYNTHROID, LEVOTHROID) 125 MCG tablet, Take 125 mcg by mouth daily before breakfast. , Disp: , Rfl: 0 .  lisinopril (PRINIVIL,ZESTRIL) 10 MG tablet, take 1 tablet by mouth once  daily (Patient taking differently: Take 10 mg by mouth daily. ), Disp: 30 tablet, Rfl: 2 .  loratadine (CLARITIN) 10 MG tablet, Take 10 mg by mouth 2 (two) times daily. , Disp: , Rfl:  .  metFORMIN (GLUCOPHAGE) 500 MG tablet, Take 1 tablet (500 mg total) by mouth 2 (two) times daily with a meal. Do not take 03/23/2018 and 03/24/2018. Restart taking from 03/25/2018., Disp: , Rfl:  .  Multiple Vitamins-Minerals (CENTRUM SILVER 50+WOMEN) TABS, Take 1 tablet by mouth daily. , Disp: , Rfl:  .  nitroGLYCERIN (NITROSTAT) 0.4 MG SL tablet, Place 1 tablet (0.4 mg total) under the tongue every 5 (five) minutes as needed for chest pain., Disp: 25 tablet, Rfl: 3 .  NOVOLOG 100 UNIT/ML injection, Inject into the skin continuous. Via insulin pump as directed up to 60 units (CGM), Disp: , Rfl: 0 .  Omega-3 Fatty Acids (FISH OIL) 1000 MG CAPS, Take 1,000 mg by mouth daily., Disp: , Rfl:  .  pantoprazole (PROTONIX) 40 MG tablet, Take 1 tablet (40 mg total) by mouth 2 (two) times daily before a meal., Disp: 60 tablet, Rfl: 0 .  Polyethylene Glycol 400 (BLINK TEARS) 0.25 % SOLN, Apply 1 drop to eye daily as needed (for eye irritation/dryness)., Disp: , Rfl:  .  topiramate (TOPAMAX) 50 MG tablet, TAKE 1 TABLET BY MOUTH ONCE DAILY (Patient taking  differently: Take 50 mg by mouth every evening. ), Disp: 90 tablet, Rfl: 0 .  White Petrolatum-Mineral Oil (GENTEAL TEARS NIGHT-TIME) OINT, Apply 1 drop to eye daily as needed (for dryness/irritation)., Disp: , Rfl:   Past Medical History: Past Medical History:  Diagnosis Date  . 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 Anemia   . Brain aneurysm    frontal lobe  . CAD (coronary artery disease)    a. 02/2018: s/p DES to Proximal LAD with residual 20% RCA stenosis.  . Chronic pain   . Diabetes mellitus without complication ([place])   . Headache   . History of left bundle branch block (LBBB)   . HTN (hypertension)   . Hx of cardiovascular stress test 10/2016   intermediate risk study  .  Hypothyroidism   . Neuromuscular disorder ([place])   . Neuropathy   . Obesity   . OSA on CPAP 03/10/2015  . Retinopathy   . Sleep apnea   . Varicose veins of both lower extremities     Tobacco Use: Social History   Tobacco Use  Smoking Status Never Smoker  Smokeless Tobacco Never Used    Labs: Recent Review Flowsheet Data    Labs for ITP Cardiac and Pulmonary Rehab Latest Ref Rng & Units 01/15/2015 07/23/2015 03/20/2018 04/16/2018   Cholestrol 0 - 200 mg/dL - - - 134   LDLCALC 0 - 99 mg/dL - - - 54   HDL >40 mg/dL - - - 75   Trlycerides <150 mg/dL - - - 23   Hemoglobin A1c 4.8 - 5.6 % 9.6(A) 9.1(H) 7.8(H) -      Capillary Blood Glucose: Lab Results  Component Value Date   GLUCAP 196 (H) 04/17/2018   GLUCAP 128 (H) 04/16/2018   GLUCAP 108 (H) 04/16/2018   GLUCAP 195 (H) 04/16/2018   GLUCAP 107 (H) 04/16/2018     Exercise Target Goals: Exercise Program Goal: Individual exercise prescription set using results from initial 6 min walk test and THRR while considering  patient's activity barriers and safety.   Exercise Prescription Goal: Starting with aerobic activity 30 plus minutes a day, 3 days per week for initial exercise prescription. Provide home exercise prescription and guidelines that participant acknowledges understanding prior to discharge.  Activity Barriers & Risk Stratification: Activity Barriers & Cardiac Risk Stratification - 06/17/18 1413      Activity Barriers & Cardiac Risk Stratification   Activity Barriers  Balance Concerns;Muscular Weakness;Other (comment)    Comments  bilateral foot problems     Cardiac Risk Stratification  High       6 Minute Walk: 6 Minute Walk    Row Name 06/17/18 1411         6 Minute Walk   Phase  Initial     Distance  900 feet     Walk Time  6 minutes     # of Rest Breaks  0     MPH  1.7     METS  2.3     RPE  7     Perceived Dyspnea   9     VO2 Peak  9.54     Symptoms  No     Resting HR  94 bpm     Resting BP   116/60     Resting Oxygen Saturation   94 %     Exercise Oxygen Saturation  during 6 min walk  98 %     Max Ex. HR  134 bpm  Max Ex. BP  162/68     2 Minute Post BP  120/60        Oxygen Initial Assessment:   Oxygen Re-Evaluation:   Oxygen Discharge (Final Oxygen Re-Evaluation):   Initial Exercise Prescription: Initial Exercise Prescription - 06/17/18 1400      Date of Initial Exercise RX and Referring Provider   Date  06/17/18    Referring Provider  Koneswaran    Expected Discharge Date  09/17/18      NuStep   Level  1    SPM  102    Minutes  17    METs  2.2      Arm Ergometer   Level  1    Watts  17    RPM  60    Minutes  17    METs  2.1      Prescription Details   Frequency (times per week)  3    Duration  Progress to 30 minutes of continuous aerobic without signs/symptoms of physical distress      Intensity   THRR 40-80% of Max Heartrate  [phone]    Ratings of Perceived Exertion  11-13    Perceived Dyspnea  0-4      Progression   Progression  Continue to progress workloads to maintain intensity without signs/symptoms of physical distress.      Resistance Training   Training Prescription  Yes    Weight  1    Reps  10-15       Perform Capillary Blood Glucose checks as needed.  Exercise Prescription Changes:   Exercise Comments:   Exercise Goals and Review:  Exercise Goals    Row Name 06/17/18 1415             Exercise Goals   Increase Physical Activity  Yes       Intervention  Provide advice, education, support and counseling about physical activity/exercise needs.;Develop an individualized exercise prescription for aerobic and resistive training based on initial evaluation findings, risk stratification, comorbidities and participant's personal goals.       Expected Outcomes  Short Term: Attend rehab on a regular basis to increase amount of physical activity.       Increase Strength and Stamina  Yes       Intervention  Provide  advice, education, support and counseling about physical activity/exercise needs.;Develop an individualized exercise prescription for aerobic and resistive training based on initial evaluation findings, risk stratification, comorbidities and participant's personal goals.       Expected Outcomes  Short Term: Increase workloads from initial exercise prescription for resistance, speed, and METs.       Able to understand and use rate of perceived exertion (RPE) scale  Yes       Intervention  Provide education and explanation on how to use RPE scale       Expected Outcomes  Short Term: Able to use RPE daily in rehab to express subjective intensity level;Long Term:  Able to use RPE to guide intensity level when exercising independently       Able to understand and use Dyspnea scale  Yes       Intervention  Provide education and explanation on how to use Dyspnea scale       Expected Outcomes  Short Term: Able to use Dyspnea scale daily in rehab to express subjective sense of shortness of breath during exertion;Long Term: Able to use Dyspnea scale to guide intensity level when exercising independently  Knowledge and understanding of Target Heart Rate Range (THRR)  Yes       Intervention  Provide education and explanation of THRR including how the numbers were predicted and where they are located for reference       Expected Outcomes  Long Term: Able to use THRR to govern intensity when exercising independently;Short Term: Able to state/look up THRR;Short Term: Able to use daily as guideline for intensity in rehab       Able to check pulse independently  Yes       Intervention  Provide education and demonstration on how to check pulse in carotid and radial arteries.;Review the importance of being able to check your own pulse for safety during independent exercise       Expected Outcomes  Short Term: Able to explain why pulse checking is important during independent exercise;Long Term: Able to check pulse  independently and accurately       Understanding of Exercise Prescription  Yes       Intervention  Provide education, explanation, and written materials on patient's individual exercise prescription       Expected Outcomes  Short Term: Able to explain program exercise prescription;Long Term: Able to explain home exercise prescription to exercise independently          Exercise Goals Re-Evaluation :    Discharge Exercise Prescription (Final Exercise Prescription Changes):   Nutrition:  Target Goals: Understanding of nutrition guidelines, daily intake of sodium 1500mg , cholesterol 200mg , calories 30% from fat and 7% or less from saturated fats, daily to have 5 or more servings of fruits and vegetables.  Biometrics: Pre Biometrics - 06/17/18 1416      Pre Biometrics   Height  5\' 8"  (1.727 m)    Waist Circumference  44 inches    Hip Circumference  54 inches    Waist to Hip Ratio  0.81 %    Triceps Skinfold  23 mm    % Body Fat  46 %    Grip Strength  17.5 kg    Flexibility  0 in    Single Leg Stand  1.5 seconds        Nutrition Therapy Plan and Nutrition Goals: Nutrition Therapy & Goals - 06/17/18 1527      Personal Nutrition Goals   Personal Goal #2  Patient is eating heart healthy and diabetic healthy.    Additional Goals?  No       Nutrition Assessments: Nutrition Assessments - 06/17/18 1529      MEDFICTS Scores   Pre Score  6       Nutrition Goals Re-Evaluation:   Nutrition Goals Discharge (Final Nutrition Goals Re-Evaluation):   Psychosocial: Target Goals: Acknowledge presence or absence of significant depression and/or stress, maximize coping skills, provide positive support system. Participant is able to verbalize types and ability to use techniques and skills needed for reducing stress and depression.  Initial Review & Psychosocial Screening: Initial Psych Review & Screening - 06/17/18 1521      Initial Review   Current issues with  None  Identified      Family Dynamics   Good Support System?  Yes      Barriers   Psychosocial barriers to participate in program  There are no identifiable barriers or psychosocial needs.      Screening Interventions   Interventions  Encouraged to exercise    Expected Outcomes  Short Term goal: Identification and review with participant of any Quality of Life or  Depression concerns found by scoring the questionnaire.;Long Term goal: The participant improves quality of Life and PHQ9 Scores as seen by post scores and/or verbalization of changes       Quality of Life Scores: Quality of Life - 06/17/18 1420      Quality of Life   Select  Quality of Life      Quality of Life Scores   Health/Function Pre  24.41 %    Socioeconomic Pre  20.36 %    Psych/Spiritual Pre  26.43 %    Family Pre  30 %    GLOBAL Pre  24.08 %      Scores of 19 and below usually indicate a poorer quality of life in these areas.  A difference of  2-3 points is a clinically meaningful difference.  A difference of 2-3 points in the total score of the Quality of Life Index has been associated with significant improvement in overall quality of life, self-image, physical symptoms, and general health in studies assessing change in quality of life.  PHQ-9: Recent Review Flowsheet Data    Depression screen [facility] 2/9 06/17/2018 04/09/2017 08/04/2015 04/30/2015 05/18/2014   Decreased Interest 0 0 0 0 0   Down, Depressed, Hopeless 0 0 0 0 0   PHQ - 2 Score 0 0 0 0 0   Altered sleeping 0 - - - -   Tired, decreased energy 0 - - - -   Change in appetite 0 - - - -   Feeling bad or failure about yourself  0 - - - -   Trouble concentrating 0 - - - -   Moving slowly or fidgety/restless 0 - - - -   Suicidal thoughts 0 - - - -   PHQ-9 Score 0 - - - -   Difficult doing work/chores Not difficult at all - - - -     Interpretation of Total Score  Total Score Depression Severity:  1-4 = Minimal depression, 5-9 = Mild depression, 10-14 =  Moderate depression, 15-19 = Moderately severe depression, 20-27 = Severe depression   Psychosocial Evaluation and Intervention: Psychosocial Evaluation - 06/17/18 1523      Psychosocial Evaluation & Interventions   Interventions  Encouraged to exercise with the program and follow exercise prescription    Continue Psychosocial Services   No Follow up required       Psychosocial Re-Evaluation:   Psychosocial Discharge (Final Psychosocial Re-Evaluation):   Vocational Rehabilitation: Provide vocational rehab assistance to qualifying candidates.   Vocational Rehab Evaluation & Intervention: Vocational Rehab - 06/17/18 1530      Initial Vocational Rehab Evaluation & Intervention   Assessment shows need for Vocational Rehabilitation  No       Education: Education Goals: Education classes will be provided on a weekly basis, covering required topics. Participant will state understanding/return demonstration of topics presented.  Learning Barriers/Preferences: Learning Barriers/Preferences - 06/17/18 1529      Learning Barriers/Preferences   Learning Barriers  None    Learning Preferences  Audio;Computer/Internet;Group Instruction;Individual Instruction;Pictoral;Skilled Demonstration;Verbal Instruction;Video;Written Material       Education Topics: Hypertension, Hypertension Reduction -Define heart disease and high blood pressure. Discus how high blood pressure affects the body and ways to reduce high blood pressure.   Exercise and Your Heart -Discuss why it is important to exercise, the FITT principles of exercise, normal and abnormal responses to exercise, and how to exercise safely.   Angina -Discuss definition of angina, causes of angina, treatment of angina, and  how to decrease risk of having angina.   Cardiac Medications -Review what the following cardiac medications are used for, how they affect the body, and side effects that may occur when taking the medications.   Medications include Aspirin, Beta blockers, calcium channel blockers, ACE Inhibitors, angiotensin receptor blockers, diuretics, digoxin, and antihyperlipidemics.   Congestive Heart Failure -Discuss the definition of CHF, how to live with CHF, the signs and symptoms of CHF, and how keep track of weight and sodium intake.   Heart Disease and Intimacy -Discus the effect sexual activity has on the heart, how changes occur during intimacy as we age, and safety during sexual activity.   Smoking Cessation / COPD -Discuss different methods to quit smoking, the health benefits of quitting smoking, and the definition of COPD.   Nutrition I: Fats -Discuss the types of cholesterol, what cholesterol does to the heart, and how cholesterol levels can be controlled.   Nutrition II: Labels -Discuss the different components of food labels and how to read food label   Heart Parts/Heart Disease and PAD -Discuss the anatomy of the heart, the pathway of blood circulation through the heart, and these are affected by heart disease.   Stress I: Signs and Symptoms -Discuss the causes of stress, how stress may lead to anxiety and depression, and ways to limit stress.   Stress II: Relaxation -Discuss different types of relaxation techniques to limit stress.   Warning Signs of Stroke / TIA -Discuss definition of a stroke, what the signs and symptoms are of a stroke, and how to identify when someone is having stroke.   Knowledge Questionnaire Score: Knowledge Questionnaire Score - 06/17/18 1530      Knowledge Questionnaire Score   Pre Score  23/24       Core Components/Risk Factors/Patient Goals at Admission: Personal Goals and Risk Factors at Admission - 06/17/18 1530      Core Components/Risk Factors/Patient Goals on Admission    Weight Management  Yes    Intervention  Weight Management/Obesity: Establish reasonable short term and long term weight goals.    Admit Weight  261 lb (118.4 kg)     Goal Weight: Short Term  246 lb (111.6 kg)    Goal Weight: Long Term  231 lb (104.8 kg)    Expected Outcomes  Short Term: Continue to assess and modify interventions until short term weight is achieved;Long Term: Adherence to nutrition and physical activity/exercise program aimed toward attainment of established weight goal    Personal Goal Other  Yes    Personal Goal  Be able to walk further and lose 30lbs    Intervention  Attend program 3 x week and supplement with 2 days week at home stretching and hand heald weights.    Expected Outcomes  Reach personal goals.        Core Components/Risk Factors/Patient Goals Review:    Core Components/Risk Factors/Patient Goals at Discharge (Final Review):    ITP Comments:   Comments: Patient arrived for 1st visit/orientation/education at 1230. Patient was referred to CR by Dr. Bronson Ing due to Stent Placement (Z95.5). During orientation advised patient on arrival and appointment times what to wear, what to do before, during and after exercise. Reviewed attendance and class policy. Talked about inclement weather and class consultation policy. Pt is scheduled to return Cardiac Rehab on 06/24/18 at 0815. Pt was advised to come to class 15 minutes before class starts. Patient was also given instructions on meeting with the dietician and attending the [facility]  Structure classes. Discussed RPE/Dpysnea scales. Discussed initial THR and how to find their radial and/or carotid pulse. Discussed the initial exercise prescription and how this effects their progress. Pt is eager to get started. Patient participated in warm up stretches followed by light weights and resistance bands. Patient was able to complete 6 minute walk test. Patient did not c/o pain. Patient was measured for the equipment. Discussed equipment safety with patient. Took patient pre-anthropometric measurements. Patient finished visit at 1500.

## 2018-06-18 DIAGNOSIS — N183 Chronic kidney disease, stage 3 (moderate): Secondary | ICD-10-CM | POA: Diagnosis not present

## 2018-06-18 DIAGNOSIS — E1121 Type 2 diabetes mellitus with diabetic nephropathy: Secondary | ICD-10-CM | POA: Diagnosis not present

## 2018-06-18 DIAGNOSIS — D508 Other iron deficiency anemias: Secondary | ICD-10-CM | POA: Diagnosis not present

## 2018-06-18 DIAGNOSIS — R809 Proteinuria, unspecified: Secondary | ICD-10-CM | POA: Diagnosis not present

## 2018-06-18 DIAGNOSIS — I10 Essential (primary) hypertension: Secondary | ICD-10-CM | POA: Diagnosis not present

## 2018-06-19 DIAGNOSIS — J3081 Allergic rhinitis due to animal (cat) (dog) hair and dander: Secondary | ICD-10-CM | POA: Diagnosis not present

## 2018-06-19 DIAGNOSIS — J3089 Other allergic rhinitis: Secondary | ICD-10-CM | POA: Diagnosis not present

## 2018-06-19 DIAGNOSIS — J301 Allergic rhinitis due to pollen: Secondary | ICD-10-CM | POA: Diagnosis not present

## 2018-06-20 DIAGNOSIS — E113512 Type 2 diabetes mellitus with proliferative diabetic retinopathy with macular edema, left eye: Secondary | ICD-10-CM | POA: Diagnosis not present

## 2018-06-21 DIAGNOSIS — J301 Allergic rhinitis due to pollen: Secondary | ICD-10-CM | POA: Diagnosis not present

## 2018-06-21 DIAGNOSIS — J3081 Allergic rhinitis due to animal (cat) (dog) hair and dander: Secondary | ICD-10-CM | POA: Diagnosis not present

## 2018-06-21 DIAGNOSIS — J3089 Other allergic rhinitis: Secondary | ICD-10-CM | POA: Diagnosis not present

## 2018-06-24 ENCOUNTER — Encounter (HOSPITAL_COMMUNITY)
Admission: RE | Admit: 2018-06-24 | Discharge: 2018-06-24 | Disposition: A | Discharge status: Home / Self Care | Payer: Medicare Other | Source: Ambulatory Visit | Attending: Cardiovascular Disease | Admitting: Cardiovascular Disease

## 2018-06-24 DIAGNOSIS — Z955 Presence of coronary angioplasty implant and graft: Secondary | ICD-10-CM

## 2018-06-24 DIAGNOSIS — J301 Allergic rhinitis due to pollen: Secondary | ICD-10-CM | POA: Diagnosis not present

## 2018-06-24 DIAGNOSIS — J3081 Allergic rhinitis due to animal (cat) (dog) hair and dander: Secondary | ICD-10-CM | POA: Diagnosis not present

## 2018-06-24 DIAGNOSIS — J3089 Other allergic rhinitis: Secondary | ICD-10-CM | POA: Diagnosis not present

## 2018-06-24 NOTE — Progress Notes (Signed)
Daily Session Note  Patient Details  Name: OYINDAMOLA KEY MRN: [id number] Date of Birth: Dec 04, 1960 Referring Provider:     CARDIAC REHAB PHASE II EXERCISE from 06/17/2018 in [place]  Referring Provider  Bronson Ing      Encounter Date: 06/24/2018  Check In: Session Check In - 06/24/18 0815      Check-In   Supervising physician immediately available to respond to emergencies  See telemetry face sheet for immediately available MD    Location  [facility]    Staff Present  Aundra Dubin, RN, Cory Munch, Exercise Physiologist    Medication changes reported      No    Fall or balance concerns reported     No    Warm-up and Cool-down  Performed as group-led instruction    Resistance Training Performed  Yes    VAD Patient?  No    PAD/SET Patient?  No      Pain Assessment   Currently in Pain?  No/denies    Pain Score  0-No pain    Multiple Pain Sites  No       Capillary Blood Glucose: No results found for this or any previous visit (from the past 24 hour(s)).    Social History   Tobacco Use  Smoking Status Never Smoker  Smokeless Tobacco Never Used    Goals Met:  Independence with exercise equipment Personal goals reviewed No report of cardiac concerns or symptoms Strength training completed today  Goals Unmet:  Not Applicable  Comments: exercise good Check out 9:15.   Dr. Kate Sable is Medical Director for [facility] Cardiac and Pulmonary Rehab.

## 2018-06-26 ENCOUNTER — Encounter (HOSPITAL_COMMUNITY)
Admission: RE | Admit: 2018-06-26 | Discharge: 2018-06-26 | Disposition: A | Discharge status: Home / Self Care | Payer: Medicare Other | Source: Ambulatory Visit | Attending: Cardiovascular Disease | Admitting: Cardiovascular Disease

## 2018-06-26 DIAGNOSIS — J3089 Other allergic rhinitis: Secondary | ICD-10-CM | POA: Diagnosis not present

## 2018-06-26 DIAGNOSIS — J3081 Allergic rhinitis due to animal (cat) (dog) hair and dander: Secondary | ICD-10-CM | POA: Diagnosis not present

## 2018-06-26 DIAGNOSIS — Z955 Presence of coronary angioplasty implant and graft: Secondary | ICD-10-CM | POA: Diagnosis not present

## 2018-06-26 DIAGNOSIS — J301 Allergic rhinitis due to pollen: Secondary | ICD-10-CM | POA: Diagnosis not present

## 2018-06-26 NOTE — Progress Notes (Signed)
Daily Session Note  Patient Details  Name: Jodi Ray MRN: [id number] Date of Birth: [birth year] Referring Provider:     CARDIAC REHAB PHASE II EXERCISE from 06/17/2018 in [place]  Referring Provider  Bronson Ing      Encounter Date: 06/26/2018  Check In: Session Check In - 06/26/18 0844      Check-In   Supervising physician immediately available to respond to emergencies  See telemetry face sheet for immediately available MD    Location  [facility]    Staff Present  Russella Dar, MS, EP, [facility], Exercise Physiologist;Jaunita Mikels Zachery Conch, Exercise Physiologist;Debra Wynetta Emery, RN, BSN    Medication changes reported      No    Fall or balance concerns reported     No    Warm-up and Cool-down  Performed as group-led instruction    Resistance Training Performed  Yes    VAD Patient?  No    PAD/SET Patient?  No      Pain Assessment   Currently in Pain?  No/denies    Pain Score  0-No pain    Multiple Pain Sites  No       Capillary Blood Glucose: No results found for this or any previous visit (from the past 24 hour(s)).    Social History   Tobacco Use  Smoking Status Never Smoker  Smokeless Tobacco Never Used    Goals Met:  Independence with exercise equipment Exercise tolerated well No report of cardiac concerns or symptoms Strength training completed today  Goals Unmet:  Not Applicable  Comments: Pt able to follow exercise prescription today without complaint.  Will continue to monitor for progression. Check out 9:15.   Dr. Kate Sable is Medical Director for [facility] Cardiac and Pulmonary Rehab.

## 2018-06-28 ENCOUNTER — Encounter (HOSPITAL_COMMUNITY): Payer: Medicare Other

## 2018-07-01 ENCOUNTER — Ambulatory Visit: Payer: Medicare Other | Admitting: Cardiovascular Disease

## 2018-07-01 ENCOUNTER — Encounter (HOSPITAL_COMMUNITY): Payer: Self-pay

## 2018-07-01 ENCOUNTER — Encounter (HOSPITAL_COMMUNITY)
Admission: RE | Admit: 2018-07-01 | Discharge: 2018-07-01 | Disposition: A | Discharge status: Home / Self Care | Payer: Medicare Other | Source: Ambulatory Visit | Attending: Cardiovascular Disease | Admitting: Cardiovascular Disease

## 2018-07-01 ENCOUNTER — Encounter (HOSPITAL_COMMUNITY)
Admission: RE | Admit: 2018-07-01 | Discharge: 2018-07-01 | Disposition: A | Discharge status: Home / Self Care | Payer: Medicare Other | Source: Ambulatory Visit | Attending: Nephrology | Admitting: Nephrology

## 2018-07-01 DIAGNOSIS — Z955 Presence of coronary angioplasty implant and graft: Secondary | ICD-10-CM | POA: Diagnosis not present

## 2018-07-01 DIAGNOSIS — D509 Iron deficiency anemia, unspecified: Secondary | ICD-10-CM | POA: Diagnosis not present

## 2018-07-01 DIAGNOSIS — J3081 Allergic rhinitis due to animal (cat) (dog) hair and dander: Secondary | ICD-10-CM | POA: Diagnosis not present

## 2018-07-01 DIAGNOSIS — J3089 Other allergic rhinitis: Secondary | ICD-10-CM | POA: Diagnosis not present

## 2018-07-01 DIAGNOSIS — J301 Allergic rhinitis due to pollen: Secondary | ICD-10-CM | POA: Diagnosis not present

## 2018-07-01 MED ORDER — SODIUM CHLORIDE 0.9 % IV SOLN
510.0000 mg | Freq: Once | INTRAVENOUS | Status: AC
  Administered 2018-07-01: 510 mg via INTRAVENOUS
  Filled 2018-07-01: qty 17

## 2018-07-01 MED ORDER — SODIUM CHLORIDE 0.9 % IV SOLN
Freq: Once | INTRAVENOUS | Status: AC
  Administered 2018-07-01: 10:00:00 via INTRAVENOUS

## 2018-07-01 NOTE — Progress Notes (Signed)
Daily Session Note  Patient Details  Name: Jodi Ray MRN: [id number] Date of Birth: 10-Dec-1960 Referring Provider:     CARDIAC REHAB PHASE II EXERCISE from 06/17/2018 in [place]  Referring Provider  Bronson Ing      Encounter Date: 07/01/2018  Check In: Session Check In - 07/01/18 0815      Check-In   Supervising physician immediately available to respond to emergencies  See telemetry face sheet for immediately available MD    Location  [facility]    Staff Present  Benay Pike, Exercise Physiologist;Debra Wynetta Emery, RN, BSN    Medication changes reported      No    Fall or balance concerns reported     No    Warm-up and Cool-down  Performed as group-led instruction    Resistance Training Performed  Yes    VAD Patient?  No    PAD/SET Patient?  No      Pain Assessment   Currently in Pain?  No/denies    Pain Score  0-No pain    Multiple Pain Sites  No       Capillary Blood Glucose: No results found for this or any previous visit (from the past 24 hour(s)).    Social History   Tobacco Use  Smoking Status Never Smoker  Smokeless Tobacco Never Used    Goals Met:  Independence with exercise equipment Exercise tolerated well No report of cardiac concerns or symptoms Strength training completed today  Goals Unmet:  Not Applicable  Comments: Pt able to follow exercise prescription today without complaint.  Will continue to monitor for progression. Check out 9:15.   Dr. Kate Sable is Medical Director for [facility] Cardiac and Pulmonary Rehab.

## 2018-07-01 NOTE — Progress Notes (Signed)
Patient tolerated first ferriheme infusion without complications. Teaching completed and written info provided to patient.  She had no questions and verbalized understanding of reactions to look for.

## 2018-07-01 NOTE — Progress Notes (Signed)
Cardiac Individual Treatment Plan  Patient Details  Name: Jodi Ray MRN: [id number] Date of Birth: [birth year] Referring Provider:     CARDIAC REHAB PHASE II EXERCISE from 06/17/2018 in [facility]  Referring Provider  Bronson Ing      Initial Encounter Date:    CARDIAC REHAB PHASE II EXERCISE from 06/17/2018 in [place]  Date  06/17/18      Visit Diagnosis: Status post coronary artery stent placement  Patient's Home Medications on Admission:  Current Outpatient Medications:  .  aspirin EC 81 MG tablet, Take 81 mg by mouth daily., Disp: , Rfl:  .  atorvastatin (LIPITOR) 80 MG tablet, Take 1 tablet (80 mg total) by mouth daily at 6 PM., Disp: 90 tablet, Rfl: 3 .  Cholecalciferol (VITAMIN D-3) 5000 units TABS, Take 5,000 Units by mouth every 3 (three) days. , Disp: , Rfl:  .  clopidogrel (PLAVIX) 75 MG tablet, Take 1 tablet (75 mg total) by mouth daily with breakfast., Disp: 90 tablet, Rfl: 3 .  dicyclomine (BENTYL) 10 MG capsule, Take 10 mg by mouth 4 (four) times daily -  before meals and at bedtime., Disp: , Rfl:  .  EPINEPHrine 0.3 mg/0.3 mL IJ SOAJ injection, Inject 0.3 mg into the muscle as needed., Disp: , Rfl: 1 .  Fluocinolone Acetonide (DERMOTIC) 0.01 % OIL, Place 1 drop into both ears 3 (three) times a week. , Disp: , Rfl:  .  gabapentin (NEURONTIN) 400 MG capsule, Take 1,200 mg by mouth at bedtime. , Disp: , Rfl:  .  hydrochlorothiazide (MICROZIDE) 12.5 MG capsule, Take 12.5 mg by mouth daily., Disp: , Rfl:  .  Insulin Human (INSULIN PUMP) SOLN, Inject into the skin continuous. Novolog (up to 60 units as of 02/24/2018), Disp: , Rfl:  .  levocetirizine (XYZAL) 5 MG tablet, Take 5 mg by mouth daily with breakfast., Disp: , Rfl:  .  levothyroxine (SYNTHROID, LEVOTHROID) 125 MCG tablet, Take 125 mcg by mouth daily before breakfast. , Disp: , Rfl: 0 .  lisinopril (PRINIVIL,ZESTRIL) 10 MG tablet, take 1 tablet by mouth once  daily (Patient taking differently: Take 10 mg by mouth daily. ), Disp: 30 tablet, Rfl: 2 .  loratadine (CLARITIN) 10 MG tablet, Take 10 mg by mouth 2 (two) times daily. , Disp: , Rfl:  .  metFORMIN (GLUCOPHAGE) 500 MG tablet, Take 1 tablet (500 mg total) by mouth 2 (two) times daily with a meal. Do not take 03/23/2018 and 03/24/2018. Restart taking from 03/25/2018., Disp: , Rfl:  .  Multiple Vitamins-Minerals (CENTRUM SILVER 50+WOMEN) TABS, Take 1 tablet by mouth daily. , Disp: , Rfl:  .  nitroGLYCERIN (NITROSTAT) 0.4 MG SL tablet, Place 1 tablet (0.4 mg total) under the tongue every 5 (five) minutes as needed for chest pain., Disp: 25 tablet, Rfl: 3 .  NOVOLOG 100 UNIT/ML injection, Inject into the skin continuous. Via insulin pump as directed up to 60 units (CGM), Disp: , Rfl: 0 .  Omega-3 Fatty Acids (FISH OIL) 1000 MG CAPS, Take 1,000 mg by mouth daily., Disp: , Rfl:  .  pantoprazole (PROTONIX) 40 MG tablet, Take 1 tablet (40 mg total) by mouth 2 (two) times daily before a meal., Disp: 60 tablet, Rfl: 0 .  Polyethylene Glycol 400 (BLINK TEARS) 0.25 % SOLN, Apply 1 drop to eye daily as needed (for eye irritation/dryness)., Disp: , Rfl:  .  topiramate (TOPAMAX) 50 MG tablet, TAKE 1 TABLET BY MOUTH ONCE DAILY (Patient taking  differently: Take 50 mg by mouth every evening. ), Disp: 90 tablet, Rfl: 0 .  White Petrolatum-Mineral Oil (GENTEAL TEARS NIGHT-TIME) OINT, Apply 1 drop to eye daily as needed (for dryness/irritation)., Disp: , Rfl:   Past Medical History: Past Medical History:  Diagnosis Date  . 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 Anemia   . Brain aneurysm    frontal lobe  . CAD (coronary artery disease)    a. 02/2018: s/p DES to Proximal LAD with residual 20% RCA stenosis.  . Chronic pain   . Diabetes mellitus without complication ([place])   . Headache   . History of left bundle branch block (LBBB)   . HTN (hypertension)   . Hx of cardiovascular stress test 10/2016   intermediate risk study  .  Hypothyroidism   . Neuromuscular disorder ([place])   . Neuropathy   . Obesity   . OSA on CPAP 03/10/2015  . Retinopathy   . Sleep apnea   . Varicose veins of both lower extremities     Tobacco Use: Social History   Tobacco Use  Smoking Status Never Smoker  Smokeless Tobacco Never Used    Labs: Recent Review Flowsheet Data    Labs for ITP Cardiac and Pulmonary Rehab Latest Ref Rng & Units 01/15/2015 07/23/2015 03/20/2018 04/16/2018   Cholestrol 0 - 200 mg/dL - - - 134   LDLCALC 0 - 99 mg/dL - - - 54   HDL >40 mg/dL - - - 75   Trlycerides <150 mg/dL - - - 23   Hemoglobin A1c 4.8 - 5.6 % 9.6(A) 9.1(H) 7.8(H) -      Capillary Blood Glucose: Lab Results  Component Value Date   GLUCAP 196 (H) 04/17/2018   GLUCAP 128 (H) 04/16/2018   GLUCAP 108 (H) 04/16/2018   GLUCAP 195 (H) 04/16/2018   GLUCAP 107 (H) 04/16/2018     Exercise Target Goals: Exercise Program Goal: Individual exercise prescription set using results from initial 6 min walk test and THRR while considering  patient's activity barriers and safety.   Exercise Prescription Goal: Starting with aerobic activity 30 plus minutes a day, 3 days per week for initial exercise prescription. Provide home exercise prescription and guidelines that participant acknowledges understanding prior to discharge.  Activity Barriers & Risk Stratification: Activity Barriers & Cardiac Risk Stratification - 06/17/18 1413      Activity Barriers & Cardiac Risk Stratification   Activity Barriers  Balance Concerns;Muscular Weakness;Other (comment)    Comments  bilateral foot problems     Cardiac Risk Stratification  High       6 Minute Walk: 6 Minute Walk    Row Name 06/17/18 1411         6 Minute Walk   Phase  Initial     Distance  900 feet     Walk Time  6 minutes     # of Rest Breaks  0     MPH  1.7     METS  2.3     RPE  7     Perceived Dyspnea   9     VO2 Peak  9.54     Symptoms  No     Resting HR  94 bpm     Resting BP   116/60     Resting Oxygen Saturation   94 %     Exercise Oxygen Saturation  during 6 min walk  98 %     Max Ex. HR  134 bpm  Max Ex. BP  162/68     2 Minute Post BP  120/60        Oxygen Initial Assessment:   Oxygen Re-Evaluation:   Oxygen Discharge (Final Oxygen Re-Evaluation):   Initial Exercise Prescription: Initial Exercise Prescription - 06/17/18 1400      Date of Initial Exercise RX and Referring Provider   Date  06/17/18    Referring Provider  Koneswaran    Expected Discharge Date  09/17/18      NuStep   Level  1    SPM  102    Minutes  17    METs  2.2      Arm Ergometer   Level  1    Watts  17    RPM  60    Minutes  17    METs  2.1      Prescription Details   Frequency (times per week)  3    Duration  Progress to 30 minutes of continuous aerobic without signs/symptoms of physical distress      Intensity   THRR 40-80% of Max Heartrate  [phone]    Ratings of Perceived Exertion  11-13    Perceived Dyspnea  0-4      Progression   Progression  Continue to progress workloads to maintain intensity without signs/symptoms of physical distress.      Resistance Training   Training Prescription  Yes    Weight  1    Reps  10-15       Perform Capillary Blood Glucose checks as needed.  Exercise Prescription Changes:   Exercise Comments:   Exercise Goals and Review: Exercise Goals    Row Name 06/17/18 1415             Exercise Goals   Increase Physical Activity  Yes       Intervention  Provide advice, education, support and counseling about physical activity/exercise needs.;Develop an individualized exercise prescription for aerobic and resistive training based on initial evaluation findings, risk stratification, comorbidities and participant's personal goals.       Expected Outcomes  Short Term: Attend rehab on a regular basis to increase amount of physical activity.       Increase Strength and Stamina  Yes       Intervention  Provide  advice, education, support and counseling about physical activity/exercise needs.;Develop an individualized exercise prescription for aerobic and resistive training based on initial evaluation findings, risk stratification, comorbidities and participant's personal goals.       Expected Outcomes  Short Term: Increase workloads from initial exercise prescription for resistance, speed, and METs.       Able to understand and use rate of perceived exertion (RPE) scale  Yes       Intervention  Provide education and explanation on how to use RPE scale       Expected Outcomes  Short Term: Able to use RPE daily in rehab to express subjective intensity level;Long Term:  Able to use RPE to guide intensity level when exercising independently       Able to understand and use Dyspnea scale  Yes       Intervention  Provide education and explanation on how to use Dyspnea scale       Expected Outcomes  Short Term: Able to use Dyspnea scale daily in rehab to express subjective sense of shortness of breath during exertion;Long Term: Able to use Dyspnea scale to guide intensity level when exercising independently  Knowledge and understanding of Target Heart Rate Range (THRR)  Yes       Intervention  Provide education and explanation of THRR including how the numbers were predicted and where they are located for reference       Expected Outcomes  Long Term: Able to use THRR to govern intensity when exercising independently;Short Term: Able to state/look up THRR;Short Term: Able to use daily as guideline for intensity in rehab       Able to check pulse independently  Yes       Intervention  Provide education and demonstration on how to check pulse in carotid and radial arteries.;Review the importance of being able to check your own pulse for safety during independent exercise       Expected Outcomes  Short Term: Able to explain why pulse checking is important during independent exercise;Long Term: Able to check pulse  independently and accurately       Understanding of Exercise Prescription  Yes       Intervention  Provide education, explanation, and written materials on patient's individual exercise prescription       Expected Outcomes  Short Term: Able to explain program exercise prescription;Long Term: Able to explain home exercise prescription to exercise independently          Exercise Goals Re-Evaluation :    Discharge Exercise Prescription (Final Exercise Prescription Changes):   Nutrition:  Target Goals: Understanding of nutrition guidelines, daily intake of sodium 1500mg , cholesterol 200mg , calories 30% from fat and 7% or less from saturated fats, daily to have 5 or more servings of fruits and vegetables.  Biometrics: Pre Biometrics - 06/17/18 1416      Pre Biometrics   Height  5\' 8"  (1.727 m)    Waist Circumference  44 inches    Hip Circumference  54 inches    Waist to Hip Ratio  0.81 %    Triceps Skinfold  23 mm    % Body Fat  46 %    Grip Strength  17.5 kg    Flexibility  0 in    Single Leg Stand  1.5 seconds        Nutrition Therapy Plan and Nutrition Goals: Nutrition Therapy & Goals - 06/17/18 1527      Personal Nutrition Goals   Personal Goal #2  Patient is eating heart healthy and diabetic healthy.    Additional Goals?  No       Nutrition Assessments: Nutrition Assessments - 06/17/18 1529      MEDFICTS Scores   Pre Score  6       Nutrition Goals Re-Evaluation:   Nutrition Goals Discharge (Final Nutrition Goals Re-Evaluation):   Psychosocial: Target Goals: Acknowledge presence or absence of significant depression and/or stress, maximize coping skills, provide positive support system. Participant is able to verbalize types and ability to use techniques and skills needed for reducing stress and depression.  Initial Review & Psychosocial Screening: Initial Psych Review & Screening - 06/17/18 1521      Initial Review   Current issues with  None  Identified      Family Dynamics   Good Support System?  Yes      Barriers   Psychosocial barriers to participate in program  There are no identifiable barriers or psychosocial needs.      Screening Interventions   Interventions  Encouraged to exercise    Expected Outcomes  Short Term goal: Identification and review with participant of any Quality of Life or  Depression concerns found by scoring the questionnaire.;Long Term goal: The participant improves quality of Life and PHQ9 Scores as seen by post scores and/or verbalization of changes       Quality of Life Scores: Quality of Life - 06/17/18 1420      Quality of Life   Select  Quality of Life      Quality of Life Scores   Health/Function Pre  24.41 %    Socioeconomic Pre  20.36 %    Psych/Spiritual Pre  26.43 %    Family Pre  30 %    GLOBAL Pre  24.08 %      Scores of 19 and below usually indicate a poorer quality of life in these areas.  A difference of  2-3 points is a clinically meaningful difference.  A difference of 2-3 points in the total score of the Quality of Life Index has been associated with significant improvement in overall quality of life, self-image, physical symptoms, and general health in studies assessing change in quality of life.  PHQ-9: Recent Review Flowsheet Data    Depression screen [facility] 2/9 06/17/2018 04/09/2017 08/04/2015 04/30/2015 05/18/2014   Decreased Interest 0 0 0 0 0   Down, Depressed, Hopeless 0 0 0 0 0   PHQ - 2 Score 0 0 0 0 0   Altered sleeping 0 - - - -   Tired, decreased energy 0 - - - -   Change in appetite 0 - - - -   Feeling bad or failure about yourself  0 - - - -   Trouble concentrating 0 - - - -   Moving slowly or fidgety/restless 0 - - - -   Suicidal thoughts 0 - - - -   PHQ-9 Score 0 - - - -   Difficult doing work/chores Not difficult at all - - - -     Interpretation of Total Score  Total Score Depression Severity:  1-4 = Minimal depression, 5-9 = Mild depression, 10-14 =  Moderate depression, 15-19 = Moderately severe depression, 20-27 = Severe depression   Psychosocial Evaluation and Intervention: Psychosocial Evaluation - 06/17/18 1523      Psychosocial Evaluation & Interventions   Interventions  Encouraged to exercise with the program and follow exercise prescription    Continue Psychosocial Services   No Follow up required       Psychosocial Re-Evaluation:   Psychosocial Discharge (Final Psychosocial Re-Evaluation):   Vocational Rehabilitation: Provide vocational rehab assistance to qualifying candidates.   Vocational Rehab Evaluation & Intervention: Vocational Rehab - 06/17/18 1530      Initial Vocational Rehab Evaluation & Intervention   Assessment shows need for Vocational Rehabilitation  No       Education: Education Goals: Education classes will be provided on a weekly basis, covering required topics. Participant will state understanding/return demonstration of topics presented.  Learning Barriers/Preferences: Learning Barriers/Preferences - 06/17/18 1529      Learning Barriers/Preferences   Learning Barriers  None    Learning Preferences  Audio;Computer/Internet;Group Instruction;Individual Instruction;Pictoral;Skilled Demonstration;Verbal Instruction;Video;Written Material       Education Topics: Hypertension, Hypertension Reduction -Define heart disease and high blood pressure. Discus how high blood pressure affects the body and ways to reduce high blood pressure.   Exercise and Your Heart -Discuss why it is important to exercise, the FITT principles of exercise, normal and abnormal responses to exercise, and how to exercise safely.   Angina -Discuss definition of angina, causes of angina, treatment of angina, and  how to decrease risk of having angina.   Cardiac Medications -Review what the following cardiac medications are used for, how they affect the body, and side effects that may occur when taking the medications.   Medications include Aspirin, Beta blockers, calcium channel blockers, ACE Inhibitors, angiotensin receptor blockers, diuretics, digoxin, and antihyperlipidemics.   CARDIAC REHAB PHASE II EXERCISE from 06/26/2018 in [place] [facility]  Date  06/26/18  Educator  Etheleen Mayhew  Instruction Review Code  2- Demonstrated Understanding      Congestive Heart Failure -Discuss the definition of CHF, how to live with CHF, the signs and symptoms of CHF, and how keep track of weight and sodium intake.   Heart Disease and Intimacy -Discus the effect sexual activity has on the heart, how changes occur during intimacy as we age, and safety during sexual activity.   Smoking Cessation / COPD -Discuss different methods to quit smoking, the health benefits of quitting smoking, and the definition of COPD.   Nutrition I: Fats -Discuss the types of cholesterol, what cholesterol does to the heart, and how cholesterol levels can be controlled.   Nutrition II: Labels -Discuss the different components of food labels and how to read food label   Heart Parts/Heart Disease and PAD -Discuss the anatomy of the heart, the pathway of blood circulation through the heart, and these are affected by heart disease.   Stress I: Signs and Symptoms -Discuss the causes of stress, how stress may lead to anxiety and depression, and ways to limit stress.   Stress II: Relaxation -Discuss different types of relaxation techniques to limit stress.   Warning Signs of Stroke / TIA -Discuss definition of a stroke, what the signs and symptoms are of a stroke, and how to identify when someone is having stroke.   Knowledge Questionnaire Score: Knowledge Questionnaire Score - 06/17/18 1530      Knowledge Questionnaire Score   Pre Score  23/24       Core Components/Risk Factors/Patient Goals at Admission: Personal Goals and Risk Factors at Admission - 06/17/18 1530      Core Components/Risk Factors/Patient  Goals on Admission    Weight Management  Yes    Intervention  Weight Management/Obesity: Establish reasonable short term and long term weight goals.    Admit Weight  261 lb (118.4 kg)    Goal Weight: Short Term  246 lb (111.6 kg)    Goal Weight: Long Term  231 lb (104.8 kg)    Expected Outcomes  Short Term: Continue to assess and modify interventions until short term weight is achieved;Long Term: Adherence to nutrition and physical activity/exercise program aimed toward attainment of established weight goal    Personal Goal Other  Yes    Personal Goal  Be able to walk further and lose 30lbs    Intervention  Attend program 3 x week and supplement with 2 days week at home stretching and hand heald weights.    Expected Outcomes  Reach personal goals.        Core Components/Risk Factors/Patient Goals Review:    Core Components/Risk Factors/Patient Goals at Discharge (Final Review):    ITP Comments: ITP Comments    Row Name 07/01/18 1012           ITP Comments  Patient new to program. She has completed 4 sessions. Will continue to monitor for progress.           Comments: ITP REVIEW Patient new to program. She has completed 4 sessions.  Will continue to monitor for progress. Marland Kitchen

## 2018-07-01 NOTE — Discharge Instructions (Signed)

## 2018-07-03 ENCOUNTER — Encounter (HOSPITAL_COMMUNITY)
Admission: RE | Admit: 2018-07-03 | Discharge: 2018-07-03 | Disposition: A | Discharge status: Home / Self Care | Payer: Medicare Other | Source: Ambulatory Visit | Attending: Cardiovascular Disease | Admitting: Cardiovascular Disease

## 2018-07-03 DIAGNOSIS — Z955 Presence of coronary angioplasty implant and graft: Secondary | ICD-10-CM

## 2018-07-03 DIAGNOSIS — J301 Allergic rhinitis due to pollen: Secondary | ICD-10-CM | POA: Diagnosis not present

## 2018-07-03 DIAGNOSIS — J3081 Allergic rhinitis due to animal (cat) (dog) hair and dander: Secondary | ICD-10-CM | POA: Diagnosis not present

## 2018-07-03 DIAGNOSIS — J3089 Other allergic rhinitis: Secondary | ICD-10-CM | POA: Diagnosis not present

## 2018-07-03 NOTE — Progress Notes (Signed)
Daily Session Note  Patient Details  Name: Jodi Ray MRN: [id number] Date of Birth: 06/25/1961 Referring Provider:     CARDIAC REHAB PHASE II EXERCISE from 06/17/2018 in [place]  Referring Provider  Bronson Ing      Encounter Date: 07/03/2018  Check In: Session Check In - 07/03/18 0815      Check-In   Supervising physician immediately available to respond to emergencies  See telemetry face sheet for immediately available MD    Location  [facility]    Staff Present  Benay Pike, Exercise Physiologist;Ebunoluwa Gernert Wynetta Emery, RN, BSN    Medication changes reported      No    Fall or balance concerns reported     No    Warm-up and Cool-down  Performed as group-led instruction    Resistance Training Performed  Yes    VAD Patient?  No    PAD/SET Patient?  No      Pain Assessment   Currently in Pain?  No/denies    Pain Score  0-No pain    Multiple Pain Sites  No       Capillary Blood Glucose: No results found for this or any previous visit (from the past 24 hour(s)).    Social History   Tobacco Use  Smoking Status Never Smoker  Smokeless Tobacco Never Used    Goals Met:  Independence with exercise equipment Exercise tolerated well No report of cardiac concerns or symptoms Strength training completed today  Goals Unmet:  Not Applicable  Comments: Pt able to follow exercise prescription today without complaint.  Will continue to monitor for progression. Check out 915.   Dr. Kate Sable is Medical Director for [facility] Cardiac and Pulmonary Rehab.

## 2018-07-05 ENCOUNTER — Encounter (HOSPITAL_COMMUNITY)
Admission: RE | Admit: 2018-07-05 | Discharge: 2018-07-05 | Disposition: A | Discharge status: Home / Self Care | Payer: Medicare Other | Source: Ambulatory Visit | Attending: Cardiovascular Disease | Admitting: Cardiovascular Disease

## 2018-07-05 DIAGNOSIS — Z955 Presence of coronary angioplasty implant and graft: Secondary | ICD-10-CM | POA: Diagnosis not present

## 2018-07-05 NOTE — Progress Notes (Signed)
Daily Session Note  Patient Details  Name: ELDANA ISIP MRN: [id number] Date of Birth: 09/21/1960 Referring Provider:     CARDIAC REHAB PHASE II EXERCISE from 06/17/2018 in [place]  Referring Provider  Bronson Ing      Encounter Date: 07/05/2018  Check In: Session Check In - 07/05/18 0815      Check-In   Supervising physician immediately available to respond to emergencies  See telemetry face sheet for immediately available MD    Location  [facility]    Staff Present  Benay Pike, Exercise Physiologist;Debra Wynetta Emery, RN, BSN    Medication changes reported      No    Fall or balance concerns reported     No    Warm-up and Cool-down  Performed as group-led instruction    Resistance Training Performed  Yes    VAD Patient?  No    PAD/SET Patient?  No      Pain Assessment   Currently in Pain?  No/denies    Pain Score  0-No pain    Multiple Pain Sites  No       Capillary Blood Glucose: No results found for this or any previous visit (from the past 24 hour(s)).    Social History   Tobacco Use  Smoking Status Never Smoker  Smokeless Tobacco Never Used    Goals Met:  Independence with exercise equipment Exercise tolerated well No report of cardiac concerns or symptoms Strength training completed today  Goals Unmet:  Not Applicable  Comments: Pt able to follow exercise prescription today without complaint.  Will continue to monitor for progression. Check out 9:15.   Dr. Kate Sable is Medical Director for [facility] Cardiac and Pulmonary Rehab.

## 2018-07-06 ENCOUNTER — Encounter (HOSPITAL_COMMUNITY): Payer: Self-pay | Admitting: Emergency Medicine

## 2018-07-06 ENCOUNTER — Emergency Department (HOSPITAL_COMMUNITY)
Admission: EM | Admit: 2018-07-06 | Discharge: 2018-07-06 | Disposition: A | Discharge status: Home / Self Care | Payer: Medicare Other | Attending: Emergency Medicine | Admitting: Emergency Medicine

## 2018-07-06 ENCOUNTER — Other Ambulatory Visit: Payer: Self-pay

## 2018-07-06 ENCOUNTER — Emergency Department (HOSPITAL_COMMUNITY): Payer: Medicare Other

## 2018-07-06 DIAGNOSIS — E119 Type 2 diabetes mellitus without complications: Secondary | ICD-10-CM | POA: Diagnosis not present

## 2018-07-06 DIAGNOSIS — E1165 Type 2 diabetes mellitus with hyperglycemia: Secondary | ICD-10-CM | POA: Diagnosis not present

## 2018-07-06 DIAGNOSIS — Z79899 Other long term (current) drug therapy: Secondary | ICD-10-CM | POA: Diagnosis not present

## 2018-07-06 DIAGNOSIS — I10 Essential (primary) hypertension: Secondary | ICD-10-CM | POA: Insufficient documentation

## 2018-07-06 DIAGNOSIS — R0602 Shortness of breath: Secondary | ICD-10-CM | POA: Diagnosis not present

## 2018-07-06 DIAGNOSIS — R55 Syncope and collapse: Secondary | ICD-10-CM | POA: Diagnosis not present

## 2018-07-06 DIAGNOSIS — R42 Dizziness and giddiness: Secondary | ICD-10-CM | POA: Diagnosis not present

## 2018-07-06 DIAGNOSIS — E860 Dehydration: Secondary | ICD-10-CM

## 2018-07-06 DIAGNOSIS — Z7982 Long term (current) use of aspirin: Secondary | ICD-10-CM | POA: Insufficient documentation

## 2018-07-06 DIAGNOSIS — Z794 Long term (current) use of insulin: Secondary | ICD-10-CM | POA: Diagnosis not present

## 2018-07-06 DIAGNOSIS — R61 Generalized hyperhidrosis: Secondary | ICD-10-CM | POA: Diagnosis not present

## 2018-07-06 LAB — URINALYSIS, ROUTINE W REFLEX MICROSCOPIC
BACTERIA UA: NONE SEEN
Bilirubin Urine: NEGATIVE
GLUCOSE, UA: 50 mg/dL — AB
HGB URINE DIPSTICK: NEGATIVE
KETONES UR: 5 mg/dL — AB
Nitrite: NEGATIVE
PROTEIN: NEGATIVE mg/dL
Specific Gravity, Urine: 1.021 (ref 1.005–1.030)
pH: 5 (ref 5.0–8.0)

## 2018-07-06 LAB — CBC WITH DIFFERENTIAL/PLATELET
ABS IMMATURE GRANULOCYTES: 0.02 10*3/uL (ref 0.00–0.07)
BASOS ABS: 0.1 10*3/uL (ref 0.0–0.1)
Basophils Relative: 1 %
Eosinophils Absolute: 0.1 10*3/uL (ref 0.0–0.5)
Eosinophils Relative: 2 %
HEMATOCRIT: 35.9 % — AB (ref 36.0–46.0)
Hemoglobin: 10.9 g/dL — ABNORMAL LOW (ref 12.0–15.0)
IMMATURE GRANULOCYTES: 0 %
LYMPHS ABS: 1.1 10*3/uL (ref 0.7–4.0)
LYMPHS PCT: 18 %
MCH: 29 pg (ref 26.0–34.0)
MCHC: 30.4 g/dL (ref 30.0–36.0)
MCV: 95.5 fL (ref 80.0–100.0)
MONOS PCT: 9 %
Monocytes Absolute: 0.5 10*3/uL (ref 0.1–1.0)
NEUTROS ABS: 4.1 10*3/uL (ref 1.7–7.7)
NRBC: 0 % (ref 0.0–0.2)
Neutrophils Relative %: 70 %
Platelets: 235 10*3/uL (ref 150–400)
RBC: 3.76 MIL/uL — ABNORMAL LOW (ref 3.87–5.11)
RDW: 14.2 % (ref 11.5–15.5)
WBC: 5.9 10*3/uL (ref 4.0–10.5)

## 2018-07-06 LAB — COMPREHENSIVE METABOLIC PANEL
ALT: 61 U/L — AB (ref 0–44)
AST: 59 U/L — AB (ref 15–41)
Albumin: 4.3 g/dL (ref 3.5–5.0)
Alkaline Phosphatase: 87 U/L (ref 38–126)
Anion gap: 11 (ref 5–15)
BILIRUBIN TOTAL: 0.7 mg/dL (ref 0.3–1.2)
BUN: 40 mg/dL — AB (ref 6–20)
CALCIUM: 9.2 mg/dL (ref 8.9–10.3)
CO2: 18 mmol/L — AB (ref 22–32)
CREATININE: 1.75 mg/dL — AB (ref 0.44–1.00)
Chloride: 106 mmol/L (ref 98–111)
GFR calc Af Amer: 36 mL/min — ABNORMAL LOW (ref >60)
GFR calc non Af Amer: 31 mL/min — ABNORMAL LOW (ref >60)
Glucose, Bld: 298 mg/dL — ABNORMAL HIGH (ref 70–99)
POTASSIUM: 5 mmol/L (ref 3.5–5.1)
Sodium: 135 mmol/L (ref 135–145)
Total Protein: 7.8 g/dL (ref 6.5–8.1)

## 2018-07-06 LAB — TROPONIN I: Troponin I: <0.03 ng/mL (ref <0.03)

## 2018-07-06 MED ORDER — SODIUM CHLORIDE 0.9 % IV BOLUS
500.0000 mL | Freq: Once | INTRAVENOUS | Status: AC
  Administered 2018-07-06: 500 mL via INTRAVENOUS

## 2018-07-06 NOTE — ED Provider Notes (Signed)
[facility] EMERGENCY DEPARTMENT Provider Note   CSN: [id number] Arrival date & time: 07/06/18  1020     History   Chief Complaint Chief Complaint  Patient presents with  . Near Syncope    HPI Jodi Ray is a 57 y.o. female.  HPI Pt was seen at 1040. Per pt, c/o sudden onset and slow resolution of one episode of multiple symptoms that occurred PTA. 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 Pt states her CBG was "325." EMS states pt's BP was "110/60." Pt states since sitting and transport to the ED she "feels better now." Denies syncope, no CP, no cough, no fevers, no abd pain, no N/V/D, no visual changes, no focal motor weakness, no tingling/numbness in extremities, no ataxia, no slurred speech, no facial droop.    Past Medical History:  Diagnosis Date  . Adult RDS ([place])   . Anemia   . Brain aneurysm    frontal lobe  . CAD (coronary artery disease)    a. 02/2018: s/p DES to Proximal LAD with residual 20% RCA stenosis.  . Chronic pain   . Diabetes mellitus without complication ([place])   . Headache   . History of left bundle branch block (LBBB)   . HTN (hypertension)   . Hx of cardiovascular stress test 10/2016   intermediate risk study  . Hypothyroidism   . Neuromuscular disorder ([place])   . Neuropathy   . Obesity   . OSA on CPAP 03/10/2015  . Retinopathy   . Sleep apnea   . Varicose veins of both lower extremities     Patient Active Problem List   Diagnosis Date Noted  . CAD (coronary artery disease) 04/16/2018  . Unstable angina ([place]) 03/21/2018  . Chest pain 03/20/2018  . Gastritis and gastroduodenitis 03/05/2017  . Suprapubic pain 03/05/2017  . Right sided abdominal pain 10/24/2016  . Diarrhea 10/24/2016  . Family hx of colon cancer 10/24/2016  . Varicose veins of bilateral lower extremities with other complications 95/62/1308  . 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  Type I diabetes mellitus, uncontrolled ([place]) 04/30/2015  . Mixed hyperlipidemia 04/30/2015  . Primary hypothyroidism 04/30/2015  . Benign hypertension 04/30/2015  . OSA on CPAP 03/10/2015  . Migraine without aura and without status migrainosus, not intractable 01/08/2015  . Primary stabbing headache 10/02/2014    Past Surgical History:  Procedure Laterality Date  . BIOPSY  11/14/2016   Procedure: BIOPSY;  Surgeon: Danie Binder, MD;  Location: [facility];  Service: Endoscopy;;  colon gastric duodenal  . BREAST SURGERY Left 1994   Lumpectomy  . COLONOSCOPY WITH PROPOFOL N/A 11/14/2016   Dr. Oneida Alar: Moderately redundant rectosigmoid colon. Random colon biopsies benign. Internal hemorrhoids. Surveillance colonoscopy in 5 years.  . CORONARY STENT INTERVENTION N/A 03/22/2018   Procedure: CORONARY STENT INTERVENTION;  Surgeon: Burnell Blanks, MD;  Location: [facility];  Service: Cardiovascular;  Laterality: N/A;  . ESOPHAGOGASTRODUODENOSCOPY (EGD) WITH PROPOFOL N/A 11/14/2016   Dr. Oneida Alar: Esophagus normal. Moderate gastritis, few gastric polyps. Duodenal biopsies negative. Fundic gland polyp gastric polyp area no H pylori.  Marland Kitchen FOOT SURGERY    . LEFT HEART CATH AND CORONARY ANGIOGRAPHY N/A 03/22/2018   Procedure: LEFT HEART CATH AND CORONARY ANGIOGRAPHY;  Surgeon: Burnell Blanks, MD;  Location: [facility];  Service: Cardiovascular;  Laterality: N/A;  . LEG SURGERY Right   . REPLACEMENT TOTAL  KNEE Right   . SPINAL CORD STIMULATOR IMPLANT       OB History    Gravida      Para      Term      Preterm      AB      Living  0     SAB      TAB      Ectopic      Multiple      Live Births               Home Medications    Prior to Admission medications   Medication Sig Start Date End Date Taking? Authorizing Provider  aspirin EC 81 MG tablet Take 81 mg by mouth daily.   Yes [provider]  atorvastatin (LIPITOR) 80 MG tablet  Take 1 tablet (80 mg total) by mouth daily at 6 PM. 04/16/18  Yes Herminio Commons, MD  Cholecalciferol (VITAMIN D-3) 5000 units TABS Take 5,000 Units by mouth every 3 (three) days.    Yes [provider]  clopidogrel (PLAVIX) 75 MG tablet Take 1 tablet (75 mg total) by mouth daily with breakfast. 03/27/18  Yes Strader, [place] M, PA-C  dicyclomine (BENTYL) 10 MG capsule Take 10 mg by mouth 4 (four) times daily -  before meals and at bedtime.   Yes [provider]  Fluocinolone Acetonide (DERMOTIC) 0.01 % OIL Place 1 drop into both ears 3 (three) times a week.    Yes [provider]  gabapentin (NEURONTIN) 400 MG capsule Take 1,200 mg by mouth at bedtime.    Yes [provider]  hydrochlorothiazide (MICROZIDE) 12.5 MG capsule Take 12.5 mg by mouth daily.   Yes [provider]  Insulin Human (INSULIN PUMP) SOLN Inject into the skin continuous. Novolog (up to 60 units as of 02/24/2018)   Yes [provider]  levocetirizine (XYZAL) 5 MG tablet Take 5 mg by mouth daily with breakfast.   Yes [provider]  levothyroxine (SYNTHROID, LEVOTHROID) 125 MCG tablet Take 125 mcg by mouth daily before breakfast.  11/01/16  Yes [provider]  lisinopril (PRINIVIL,ZESTRIL) 10 MG tablet take 1 tablet by mouth once daily Patient taking differently: Take 10 mg by mouth daily.  07/28/15  Yes Cassandria Anger, MD  loratadine (CLARITIN) 10 MG tablet Take 10 mg by mouth 2 (two) times daily.    Yes [provider]  metFORMIN (GLUCOPHAGE) 500 MG tablet Take 1 tablet (500 mg total) by mouth 2 (two) times daily with a meal. Do not take 03/23/2018 and 03/24/2018. Restart taking from 03/25/2018. 03/23/18  Yes Hongalgi, Lenis Dickinson, MD  Multiple Vitamins-Minerals (CENTRUM SILVER 50+WOMEN) TABS Take 1 tablet by mouth daily.    Yes [provider]  NOVOLOG 100 UNIT/ML injection Inject into the skin continuous. Via insulin pump as directed up  to 60 units (CGM) 02/24/18  Yes [provider]  Omega-3 Fatty Acids (FISH OIL) 1000 MG CAPS Take 1,000 mg by mouth daily.   Yes [provider]  pantoprazole (PROTONIX) 40 MG tablet Take 1 tablet (40 mg total) by mouth 2 (two) times daily before a meal. 03/23/18 07/06/18 Yes Hongalgi, Lenis Dickinson, MD  Polyethylene Glycol 400 (BLINK TEARS) 0.25 % SOLN Apply 1 drop to eye daily as needed (for eye irritation/dryness).   Yes [provider]  topiramate (TOPAMAX) 50 MG tablet TAKE 1 TABLET BY MOUTH ONCE DAILY Patient taking differently: Take 50 mg by mouth every  evening.  01/04/18  Yes Jaffe, Adam R, DO  EPINEPHrine 0.3 mg/0.3 mL IJ SOAJ injection Inject 0.3 mg into the muscle as needed. 04/24/18   [provider]  nitroGLYCERIN (NITROSTAT) 0.4 MG SL tablet Place 1 tablet (0.4 mg total) under the tongue every 5 (five) minutes as needed for chest pain. 04/17/18   Erma Heritage, PA-C    Family History Family History  Problem Relation Age of Onset  . Cancer Mother 1       colon  . Cancer Father        renal cell  . Cancer Brother        bladder  . Cancer Sister        primary brain, and primary breast too  . Cancer Sister        breast   . Cancer Other        multiple aunts with breast, cervical, or colon cancer    Social History Social History   Tobacco Use  . Smoking status: Never Smoker  . Smokeless tobacco: Never Used  Substance Use Topics  . Alcohol use: No    Alcohol/week: 0.0 standard drinks  . Drug use: No     Allergies   Fentanyl   Review of Systems Review of Systems ROS: Statement: All systems negative except as marked or noted in the HPI; Constitutional: Negative for fever and chills. ; ; Eyes: Negative for eye pain, redness and discharge. ; ; ENMT: Negative for ear pain, hoarseness, nasal congestion, sinus pressure and sore throat. ; ; Cardiovascular: +SOB, diaphoresis, palpitations. Negative for chest pain, and peripheral edema. ; ;  Respiratory: Negative for cough, wheezing and stridor. ; ; Gastrointestinal: Negative for nausea, vomiting, diarrhea, abdominal pain, blood in stool, hematemesis, jaundice and rectal bleeding. . ; ; Genitourinary: Negative for dysuria, flank pain and hematuria. ; ; Musculoskeletal: Negative for back pain and neck pain. Negative for swelling and trauma.; ; Skin: Negative for pruritus, rash, abrasions, blisters, bruising and skin lesion.; ; Neuro: +lightheadedness. Negative for headache and neck stiffness. Negative for weakness, altered level of consciousness, altered mental status, extremity weakness, paresthesias, involuntary movement, seizure and syncope.       Physical Exam Updated Vital Signs BP 138/67   Pulse 87   Temp 97.8 F (36.6 C) (Oral)   Resp 14   Ht 5\' 8"  (1.727 m)   Wt 117.9 kg   SpO2 99%   BMI 39.53 kg/m   10:36:52 Orthostatic Vital Signs RM  Orthostatic Lying   BP- Lying: 105/50   Pulse- Lying: 94       Orthostatic Sitting  BP- Sitting: 106/50   Pulse- Sitting: 95       Orthostatic Standing at 0 minutes  BP- Standing at 0 minutes: 102/51   Pulse- Standing at 0 minutes: 96     Physical Exam 1045: Physical examination:  Nursing notes reviewed; Vital signs and O2 SAT reviewed;  Constitutional: Well developed, Well nourished, Well hydrated, In no acute distress; Head:  Normocephalic, atraumatic; Eyes: EOMI, PERRL, No scleral icterus; ENMT: Mouth and pharynx normal, Mucous membranes moist; Neck: Supple, Full range of motion, No lymphadenopathy; Cardiovascular: Regular rate and rhythm, No gallop; Respiratory: Breath sounds clear & equal bilaterally, No wheezes.  Speaking full sentences with ease, Normal respiratory effort/excursion; Chest: Nontender, Movement normal; Abdomen: Soft, Nontender, Nondistended, Normal bowel sounds; Genitourinary: No CVA tenderness; Extremities: Peripheral pulses normal, No tenderness, No edema, No calf edema or asymmetry.; Neuro: AA&Ox3, Major  CN  grossly intact. No facial droop. Speech clear. No gross focal motor or sensory deficits in extremities. Climbs on and off stretcher easily by herself. Gait steady..; Skin: Color normal, Warm, Dry.   ED Treatments / Results  Labs (all labs ordered are listed, but only abnormal results are displayed)   EKG EKG Interpretation  Date/Time:  Saturday July 06 2018 10:27:06 EST Ventricular Rate:  95 PR Interval:    QRS Duration: 135 QT Interval:  393 QTC Calculation: 495 R Axis:   77 Text Interpretation:  Sinus rhythm Non-specific intra-ventricular conduction delay When compared with ECG of 04/15/2018 No significant change was found Confirmed by Francine Graven [phone]) on 07/06/2018 10:49:12 AM   Radiology   Procedures Procedures (including critical care time)  Medications Ordered in ED Medications  sodium chloride 0.9 % bolus 500 mL (500 mLs Intravenous New Bag/Given 07/06/18 1231)     Initial Impression / Assessment and Plan / ED Course  I have reviewed the triage vital signs and the nursing notes.  Pertinent labs & imaging results that were available during my care of the patient were reviewed by me and considered in my medical decision making (see chart for details).  MDM Reviewed: previous chart, nursing note and vitals Reviewed previous: labs and ECG Interpretation: labs, ECG and x-ray   Results for orders placed or performed during the hospital encounter of 07/06/18  CBC with Differential  Result Value Ref Range   WBC 5.9 4.0 - 10.5 K/uL   RBC 3.76 (L) 3.87 - 5.11 MIL/uL   Hemoglobin 10.9 (L) 12.0 - 15.0 g/dL   HCT 35.9 (L) 36.0 - 46.0 %   MCV 95.5 80.0 - 100.0 fL   MCH 29.0 26.0 - 34.0 pg   MCHC 30.4 30.0 - 36.0 g/dL   RDW 14.2 11.5 - 15.5 %   Platelets 235 150 - 400 K/uL   nRBC 0.0 0.0 - 0.2 %   Neutrophils Relative % 70 %   Neutro Abs 4.1 1.7 - 7.7 K/uL   Lymphocytes Relative 18 %   Lymphs Abs 1.1 0.7 - 4.0 K/uL   Monocytes Relative 9 %    Monocytes Absolute 0.5 0.1 - 1.0 K/uL   Eosinophils Relative 2 %   Eosinophils Absolute 0.1 0.0 - 0.5 K/uL   Basophils Relative 1 %   Basophils Absolute 0.1 0.0 - 0.1 K/uL   Immature Granulocytes 0 %   Abs Immature Granulocytes 0.02 0.00 - 0.07 K/uL  Comprehensive metabolic panel  Result Value Ref Range   Sodium 135 135 - 145 mmol/L   Potassium 5.0 3.5 - 5.1 mmol/L   Chloride 106 98 - 111 mmol/L   CO2 18 (L) 22 - 32 mmol/L   Glucose, Bld 298 (H) 70 - 99 mg/dL   BUN 40 (H) 6 - 20 mg/dL   Creatinine, Ser 1.75 (H) 0.44 - 1.00 mg/dL   Calcium 9.2 8.9 - 10.3 mg/dL   Total Protein 7.8 6.5 - 8.1 g/dL   Albumin 4.3 3.5 - 5.0 g/dL   AST 59 (H) 15 - 41 U/L   ALT 61 (H) 0 - 44 U/L   Alkaline Phosphatase 87 38 - 126 U/L   Total Bilirubin 0.7 0.3 - 1.2 mg/dL   GFR calc non Af Amer 31 (L) >60 mL/min   GFR calc Af Amer 36 (L) >60 mL/min   Anion gap 11 5 - 15  Troponin I - ONCE - STAT  Result Value Ref Range   Troponin I <0.03 <  0.03 ng/mL  Urinalysis, Routine w reflex microscopic  Result Value Ref Range   Color, Urine AMBER (A) YELLOW   APPearance CLOUDY (A) CLEAR   Specific Gravity, Urine 1.021 1.005 - 1.030   pH 5.0 5.0 - 8.0   Glucose, UA 50 (A) NEGATIVE mg/dL   Hgb urine dipstick NEGATIVE NEGATIVE   Bilirubin Urine NEGATIVE NEGATIVE   Ketones, ur 5 (A) NEGATIVE mg/dL   Protein, ur NEGATIVE NEGATIVE mg/dL   Nitrite NEGATIVE NEGATIVE   Leukocytes, UA TRACE (A) NEGATIVE   RBC / HPF 0-5 0 - 5 RBC/hpf   WBC, UA 0-5 0 - 5 WBC/hpf   Bacteria, UA NONE SEEN NONE SEEN   Squamous Epithelial / LPF 6-10 0 - 5   Mucus PRESENT    Hyaline Casts, UA PRESENT   Troponin I - Once  Result Value Ref Range   Troponin I <0.03 <0.03 ng/mL   Dg Chest 2 View Result Date: 07/06/2018 CLINICAL DATA:  Shortness of breath and syncope. EXAM: CHEST - 2 VIEW COMPARISON:  Chest x-ray dated April 16, 2018. FINDINGS: The heart size and mediastinal contours are within normal limits. Normal pulmonary  vascularity. No focal consolidation, pleural effusion, or pneumothorax. No acute osseous abnormality. Unchanged spinal stimulator. IMPRESSION: No active cardiopulmonary disease. Electronically Signed   By: Titus Dubin M.D.   On: 07/06/2018 11:27    1245:  Judicious IVF bolus given for elevated BUN/Cr and soft BP. Pt has tol PO well without N/V. 2nd troponin ordered. Pt does not want to be observation admitted.  T/C returned from [facility] Dr. Dyann Kief, case discussed, including:  HPI, pertinent PM/SHx, VS/PE, dx testing, ED course and treatment:  Agreeable to come to ED for consult.  1355:  Pt states she "has to leave right now;" aware [facility] MD consult and 2nd troponin result is pending, as well as full IVF NS bolus. Pt refuses to continue to wait for results, as well as observation admission and/or consult by [facility] MD.  I encouraged pt to stay, continues to refuse.  Pt makes her own medical decisions.  Risks of AMA explained to pt, including, but not limited to:  stroke, heart attack, cardiac arrythmia ("irregular heart rate/beat"), "passing out," temporary and/or permanent disability, death.  Pt verb understanding and continue to refuse to stay for results and consult, understanding the consequences of her decision.  I encouraged pt to follow up with her PMD on Monday and Cards MD on Wednesday and return to the ED immediately if symptoms return, or for any other concerns.  Pt verb understanding, agreeable.      Final Clinical Impressions(s) / ED Diagnoses   Final diagnoses:  None    ED Discharge Orders    None       Francine Graven, DO 07/08/18 1519

## 2018-07-06 NOTE — Discharge Instructions (Addendum)
Take your prescriptions as directed.  Increase your fluid intake (ie:  Gatoraide) for the next few days. Move slowly when chaning positions. Eat regular meals. Call your regular medical doctor Monday to schedule a follow up appointment in the next 2 days. Call your Cardiologist on Monday to confirm your previously scheduled follow up appointment for Wednesday of this week.  Return to the Emergency Department immediately sooner if worsening.

## 2018-07-06 NOTE — ED Triage Notes (Signed)
PT brought in by [facility] today. PT was volunteering at the [employer] and was mopping the floor and became lightheaded, diaphoretic and felt SOB. Upon EMS arrival pt b/p was on the lower side for her normal 110/60 and CBG 325. PT states she ate a light breakfast this am and CBG was 70 when she woke up this morning. PT states she feels her normal upon arrival to ED.

## 2018-07-06 NOTE — ED Notes (Signed)
PT refused to stay to talk to hospitalist. Dr. Thurnell Garbe aware and pt signed out AMA.

## 2018-07-08 ENCOUNTER — Encounter (HOSPITAL_COMMUNITY)
Admission: RE | Admit: 2018-07-08 | Discharge: 2018-07-08 | Disposition: A | Discharge status: Home / Self Care | Payer: Medicare Other | Source: Ambulatory Visit | Attending: Nephrology | Admitting: Nephrology

## 2018-07-08 ENCOUNTER — Encounter (HOSPITAL_COMMUNITY)
Admission: RE | Admit: 2018-07-08 | Discharge: 2018-07-08 | Disposition: A | Discharge status: Home / Self Care | Payer: Medicare Other | Source: Ambulatory Visit | Attending: Cardiovascular Disease | Admitting: Cardiovascular Disease

## 2018-07-08 ENCOUNTER — Encounter (HOSPITAL_COMMUNITY): Payer: Self-pay

## 2018-07-08 DIAGNOSIS — J3089 Other allergic rhinitis: Secondary | ICD-10-CM | POA: Diagnosis not present

## 2018-07-08 DIAGNOSIS — J301 Allergic rhinitis due to pollen: Secondary | ICD-10-CM | POA: Diagnosis not present

## 2018-07-08 DIAGNOSIS — D509 Iron deficiency anemia, unspecified: Secondary | ICD-10-CM | POA: Diagnosis not present

## 2018-07-08 DIAGNOSIS — J3081 Allergic rhinitis due to animal (cat) (dog) hair and dander: Secondary | ICD-10-CM | POA: Diagnosis not present

## 2018-07-08 DIAGNOSIS — Z955 Presence of coronary angioplasty implant and graft: Secondary | ICD-10-CM

## 2018-07-08 MED ORDER — SODIUM CHLORIDE 0.9 % IV SOLN
Freq: Once | INTRAVENOUS | Status: AC
  Administered 2018-07-08: 10:00:00 via INTRAVENOUS

## 2018-07-08 MED ORDER — SODIUM CHLORIDE 0.9 % IV SOLN
510.0000 mg | Freq: Once | INTRAVENOUS | Status: AC
  Administered 2018-07-08: 510 mg via INTRAVENOUS
  Filled 2018-07-08: qty 17

## 2018-07-08 NOTE — Progress Notes (Signed)
Incomplete Session Note  Patient Details  Name: Jodi Ray MRN: [id number] Date of Birth: 1961-02-27 Referring Provider:     CARDIAC REHAB PHASE II EXERCISE from 06/17/2018 in [place]  Referring Provider  [name] did not complete her rehab session.  Patient stated at check in that she was seen in the ED over the weekend for low b/p. She states she does not feel well today. She is scheduled to see her cardiologist Dr. Bronson Ing Wednesday 07/10/18. She left the ED AMA. She was informed not to return Wednesday if she does not feel well and keep her appointment with Dr. Bronson Ing.

## 2018-07-10 ENCOUNTER — Encounter (HOSPITAL_COMMUNITY)
Admission: RE | Admit: 2018-07-10 | Discharge: 2018-07-10 | Disposition: A | Discharge status: Home / Self Care | Payer: Medicare Other | Source: Ambulatory Visit | Attending: Cardiovascular Disease | Admitting: Cardiovascular Disease

## 2018-07-10 ENCOUNTER — Ambulatory Visit (INDEPENDENT_AMBULATORY_CARE_PROVIDER_SITE_OTHER): Payer: Medicare Other | Admitting: Cardiovascular Disease

## 2018-07-10 ENCOUNTER — Encounter: Payer: Self-pay | Admitting: Cardiovascular Disease

## 2018-07-10 VITALS — BP 136/70 | HR 94 | Ht 68.0 in | Wt 261.0 lb

## 2018-07-10 DIAGNOSIS — Z955 Presence of coronary angioplasty implant and graft: Secondary | ICD-10-CM

## 2018-07-10 DIAGNOSIS — J3089 Other allergic rhinitis: Secondary | ICD-10-CM | POA: Diagnosis not present

## 2018-07-10 DIAGNOSIS — I10 Essential (primary) hypertension: Secondary | ICD-10-CM

## 2018-07-10 DIAGNOSIS — I447 Left bundle-branch block, unspecified: Secondary | ICD-10-CM | POA: Diagnosis not present

## 2018-07-10 DIAGNOSIS — N183 Chronic kidney disease, stage 3 (moderate): Secondary | ICD-10-CM

## 2018-07-10 DIAGNOSIS — I2510 Atherosclerotic heart disease of native coronary artery without angina pectoris: Secondary | ICD-10-CM | POA: Diagnosis not present

## 2018-07-10 DIAGNOSIS — G4733 Obstructive sleep apnea (adult) (pediatric): Secondary | ICD-10-CM | POA: Diagnosis not present

## 2018-07-10 DIAGNOSIS — Z9289 Personal history of other medical treatment: Secondary | ICD-10-CM

## 2018-07-10 DIAGNOSIS — E785 Hyperlipidemia, unspecified: Secondary | ICD-10-CM

## 2018-07-10 DIAGNOSIS — Z9989 Dependence on other enabling machines and devices: Secondary | ICD-10-CM

## 2018-07-10 DIAGNOSIS — R011 Cardiac murmur, unspecified: Secondary | ICD-10-CM | POA: Diagnosis not present

## 2018-07-10 DIAGNOSIS — J3081 Allergic rhinitis due to animal (cat) (dog) hair and dander: Secondary | ICD-10-CM | POA: Diagnosis not present

## 2018-07-10 DIAGNOSIS — I25118 Atherosclerotic heart disease of native coronary artery with other forms of angina pectoris: Secondary | ICD-10-CM

## 2018-07-10 DIAGNOSIS — Z794 Long term (current) use of insulin: Secondary | ICD-10-CM | POA: Diagnosis not present

## 2018-07-10 DIAGNOSIS — J301 Allergic rhinitis due to pollen: Secondary | ICD-10-CM | POA: Diagnosis not present

## 2018-07-10 DIAGNOSIS — E119 Type 2 diabetes mellitus without complications: Secondary | ICD-10-CM

## 2018-07-10 MED ORDER — CARVEDILOL 3.125 MG PO TABS: 3.1250 mg | ORAL_TABLET | Freq: Two times a day (BID) | ORAL | 3 refills | Status: DC

## 2018-07-10 NOTE — Patient Instructions (Signed)
Medication Instructions:  Stop HCTZ STOP LISINOPRIL  START COREG 3.125 MG TWO TIMES DAILY   Labwork 1 WEEK  BMET  Testing/Procedures: Your physician has requested that you have an echocardiogram. Echocardiography is a painless test that uses sound waves to create images of your heart. It provides your doctor with information about the size and shape of your heart and how well your heart's chambers and valves are working. This procedure takes approximately one hour. There are no restrictions for this procedure.    Follow-Up: Your physician recommends that you schedule a follow-up appointment in: 2 MONTHS    Any Other Special Instructions Will Be Listed Below (If Applicable).     If you need a refill on your cardiac medications before your next appointment, please call your pharmacy.

## 2018-07-10 NOTE — Progress Notes (Signed)
SUBJECTIVE: The patient presents for routine follow-up.  I last saw her in May 2018.  Since that time, she underwent coronary angiography on 03/22/2018 which showed 80% Proximal-LAD stenosis with 20% Proximal-RCA stenosis. Successful PTCA/DES x 1 was performed to the proximal LAD and she was started on DAPT with Aspirin 81mg  daily and Clopidogrel 75mg  daily.   She was evaluated for near syncope in the ED on 07/06/2018.  I reviewed all relevant documentation, labs, and studies.  She apparently signed out AMA.  She was given IV fluids for elevated creatinine and soft blood pressure. 2 sets of troponins were normal.  Chest x-ray showed no active cardiopulmonary disease.  ECG showed sinus rhythm with a left bundle branch block (chronic).  She currently denies chest pain and shortness of breath.  She has chronic leg swelling for which she uses compression stockings.  She did experience palpitations on the day she went to the ED.  She said she drinks ten 8 ounce glasses of water daily.  Her blood pressures have been fluctuating with a systolic reading of 295 last week while in cardiac rehabilitation.  She has recently been receiving allergy shots.  She is scheduled to see her nephrologist in February.   Social history:She is originally from [place]. She used to be a Forensic psychologist. She knows [place] very well.  Review of Systems: As per "subjective", otherwise negative.  Allergies  Allergen Reactions  . Fentanyl Nausea And Vomiting and Other (See Comments)    Nausea, vomiting, loss of consciousness, requiring reversal    Current Outpatient Medications  Medication Sig Dispense Refill  . aspirin EC 81 MG tablet Take 81 mg by mouth daily.    Marland Kitchen atorvastatin (LIPITOR) 80 MG tablet Take 1 tablet (80 mg total) by mouth daily at 6 PM. 90 tablet 3  . Cholecalciferol (VITAMIN D-3) 5000 units TABS Take 5,000 Units by mouth every 3 (three) days.     . clopidogrel (PLAVIX) 75  MG tablet Take 1 tablet (75 mg total) by mouth daily with breakfast. 90 tablet 3  . dicyclomine (BENTYL) 10 MG capsule Take 10 mg by mouth 4 (four) times daily -  before meals and at bedtime.    Marland Kitchen EPINEPHrine 0.3 mg/0.3 mL IJ SOAJ injection Inject 0.3 mg into the muscle as needed.  1  . Fluocinolone Acetonide (DERMOTIC) 0.01 % OIL Place 1 drop into both ears 3 (three) times a week.     . gabapentin (NEURONTIN) 400 MG capsule Take 1,200 mg by mouth at bedtime.     . hydrochlorothiazide (MICROZIDE) 12.5 MG capsule Take 12.5 mg by mouth daily.    . Insulin Human (INSULIN PUMP) SOLN Inject into the skin continuous. Novolog (up to 60 units as of 02/24/2018)    . levocetirizine (XYZAL) 5 MG tablet Take 5 mg by mouth daily with breakfast.    . levothyroxine (SYNTHROID, LEVOTHROID) 125 MCG tablet Take 125 mcg by mouth daily before breakfast.   0  . lisinopril (PRINIVIL,ZESTRIL) 10 MG tablet take 1 tablet by mouth once daily (Patient taking differently: Take 10 mg by mouth daily. ) 30 tablet 2  . loratadine (CLARITIN) 10 MG tablet Take 10 mg by mouth 2 (two) times daily.     . metFORMIN (GLUCOPHAGE) 500 MG tablet Take 1 tablet (500 mg total) by mouth 2 (two) times daily with a meal. Do not take 03/23/2018 and 03/24/2018. Restart taking from 03/25/2018.    . Multiple Vitamins-Minerals (CENTRUM  SILVER 50+WOMEN) TABS Take 1 tablet by mouth daily.     . nitroGLYCERIN (NITROSTAT) 0.4 MG SL tablet Place 1 tablet (0.4 mg total) under the tongue every 5 (five) minutes as needed for chest pain. 25 tablet 3  . NOVOLOG 100 UNIT/ML injection Inject into the skin continuous. Via insulin pump as directed up to 60 units (CGM)  0  . Omega-3 Fatty Acids (FISH OIL) 1000 MG CAPS Take 1,000 mg by mouth daily.    . Polyethylene Glycol 400 (BLINK TEARS) 0.25 % SOLN Apply 1 drop to eye daily as needed (for eye irritation/dryness).    . topiramate (TOPAMAX) 50 MG tablet TAKE 1 TABLET BY MOUTH ONCE DAILY (Patient taking differently:  Take 50 mg by mouth every evening. ) 90 tablet 0  . pantoprazole (PROTONIX) 40 MG tablet Take 1 tablet (40 mg total) by mouth 2 (two) times daily before a meal. 60 tablet 0   No current facility-administered medications for this visit.     Past Medical History:  Diagnosis Date  . 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 Anemia   . Brain aneurysm    frontal lobe  . CAD (coronary artery disease)    a. 02/2018: s/p DES to Proximal LAD with residual 20% RCA stenosis.  . Chronic pain   . Diabetes mellitus without complication ([place])   . Headache   . History of left bundle branch block (LBBB)   . HTN (hypertension)   . Hx of cardiovascular stress test 10/2016   intermediate risk study  . Hypothyroidism   . Neuromuscular disorder ([place])   . Neuropathy   . Obesity   . OSA on CPAP 03/10/2015  . Retinopathy   . Sleep apnea   . Varicose veins of both lower extremities     Past Surgical History:  Procedure Laterality Date  . BIOPSY  11/14/2016   Procedure: BIOPSY;  Surgeon: Danie Binder, MD;  [facility]: [facility];  Service: Endoscopy;;  colon gastric duodenal  . BREAST SURGERY Left 1994   Lumpectomy  . COLONOSCOPY WITH PROPOFOL N/A 11/14/2016   Dr. Oneida Alar: Moderately redundant rectosigmoid colon. Random colon biopsies benign. Internal hemorrhoids. Surveillance colonoscopy in 5 years.  . CORONARY STENT INTERVENTION N/A 03/22/2018   Procedure: CORONARY STENT INTERVENTION;  Surgeon: Burnell Blanks, MD;  Location: [facility];  Service: Cardiovascular;  Laterality: N/A;  . ESOPHAGOGASTRODUODENOSCOPY (EGD) WITH PROPOFOL N/A 11/14/2016   Dr. Oneida Alar: Esophagus normal. Moderate gastritis, few gastric polyps. Duodenal biopsies negative. Fundic gland polyp gastric polyp area no H pylori.  Marland Kitchen FOOT SURGERY    . LEFT HEART CATH AND CORONARY ANGIOGRAPHY N/A 03/22/2018   Procedure: LEFT HEART CATH AND CORONARY ANGIOGRAPHY;  Surgeon: Burnell Blanks, MD;  Location: [facility];  Service:  Cardiovascular;  Laterality: N/A;  . LEG SURGERY Right   . REPLACEMENT TOTAL KNEE Right   . SPINAL CORD STIMULATOR IMPLANT      Social History   Socioeconomic History  . Marital status: Widowed    Spouse name: Not on file  . Number of children: Not on file  . Years of education: Not on file  . Highest education level: Not on file  Occupational History  . Occupation: Retired  Scientific laboratory technician  . Financial resource strain: Not on file  . Food insecurity:    Worry: Not on file    Inability: Not on file  . Transportation needs:    Medical: Not on file  Non-medical: Not on file  Tobacco Use  . Smoking status: Never Smoker  . Smokeless tobacco: Never Used  Substance and Sexual Activity  . Alcohol use: No    Alcohol/week: 0.0 standard drinks  . Drug use: No  . Sexual activity: Yes    Partners: Male  Lifestyle  . Physical activity:    Days per week: Not on file    Minutes per session: Not on file  . Stress: Not on file  Relationships  . Social connections:    Talks on phone: Not on file    Gets together: Not on file    Attends religious service: Not on file    Active member of club or organization: Not on file    Attends meetings of clubs or organizations: Not on file    Relationship status: Not on file  . Intimate partner violence:    Fear of current or ex partner: Not on file    Emotionally abused: Not on file    Physically abused: Not on file    Forced sexual activity: Not on file  Other Topics Concern  . Not on file  Social History Narrative   Drinks about 1-2 cups of caffeine daily.     Vitals:   07/10/18 1309  BP: 136/70  Pulse: 94  SpO2: 98%  Weight: 261 lb (118.4 kg)  Height: 5\' 8"  (1.727 m)    Wt Readings from Last 3 Encounters:  07/10/18 261 lb (118.4 kg)  07/06/18 260 lb (117.9 kg)  06/17/18 261 lb (118.4 kg)     PHYSICAL EXAM General: NAD HEENT: Normal. Neck: No JVD, no thyromegaly. Lungs: Clear to auscultation bilaterally with normal  respiratory effort. CV: Regular rate and rhythm, normal S1/S2, no B0/S1, soft systolic murmur over left upper sternal border.  Trace bilateral lower extremity edema (wearing compression stockings).  No carotid bruit.   Abdomen: Soft, nontender, no distention.  Neurologic: Alert and oriented.  Psych: Normal affect. Skin: Normal. Musculoskeletal: No gross deformities.    ECG: Reviewed above under Subjective   Labs: Lab Results  Component Value Date/Time   K 5.0 07/06/2018 11:11 AM   BUN 40 (H) 07/06/2018 11:11 AM   CREATININE 1.75 (H) 07/06/2018 11:11 AM   CREATININE 0.98 07/23/2015 07:07 AM   ALT 61 (H) 07/06/2018 11:11 AM   TSH 1.990 07/23/2015 07:07 AM   HGB 10.9 (L) 07/06/2018 11:11 AM     Lipids: Lab Results  Component Value Date/Time   LDLCALC 54 04/16/2018 08:15 AM   CHOL 134 04/16/2018 08:15 AM   TRIG 23 04/16/2018 08:15 AM   HDL 75 04/16/2018 08:15 AM       ASSESSMENT AND PLAN:  1.  Coronary artery disease: Status post drug-eluting stent placement to the proximal LAD in August 2019.  Left ventricular systolic function is normal, EF 55 to 65%, at the time of cath.  She denies chest pain and shortness of breath.  Continue dual antiplatelet therapy with aspirin Plavix (recommendation by interventional cardiology is to complete 1 year of aspirin Plavix if she tolerates it well) along with atorvastatin.  I am starting carvedilol 3.125 mg twice daily given renal insufficiency.  2.  Hypertension: Blood pressure is normal.  However, creatinine is markedly elevated, 1.75 on 07/06/2018.  I will stop lisinopril and HCTZ.  I will start carvedilol 3.125 mg twice daily.  I will check a basic metabolic panel in 1 week.  3.  Hyperlipidemia: LDL normal at 54 on 04/16/2018 with full  lipid panel reviewed above.  Continue atorvastatin.  4.  Obstructive sleep apnea: Continue CPAP.  5.  Insulin-dependent diabetes mellitus: Followed by endocrinology.  6.  Chronic kidney disease stage  III: Creatinine up to 1.75 on 07/06/2018.  Followed by nephrology.  I will stop lisinopril and HCTZ.  I will repeat a basic metabolic panel in 1 week.  7.  Cardiac murmur: I will order a 2-D echocardiogram with Doppler to evaluate cardiac structure, function, and regional wall motion.    Disposition: Follow up 2 months  Time spent: 40 minutes, of which greater than 50% was spent reviewing symptoms, relevant blood tests and studies, and discussing management plan with the patient.    Kate Sable, M.D., F.A.C.C.

## 2018-07-10 NOTE — Progress Notes (Signed)
Daily Session Note  Patient Details  Name: RAILYN HOUSE MRN: [id number] Date of Birth: 01/18/1961 Referring Provider:     CARDIAC REHAB PHASE II EXERCISE from 06/17/2018 in [place]  Referring Provider  Bronson Ing      Encounter Date: 07/10/2018  Check In: Session Check In - 07/10/18 0815      Check-In   Supervising physician immediately available to respond to emergencies  See telemetry face sheet for immediately available MD    Location  [facility]    Staff Present  Benay Pike, Exercise Physiologist;Debra Wynetta Emery, RN, BSN;Other    Medication changes reported      No    Fall or balance concerns reported     No    Warm-up and Cool-down  Performed as group-led instruction    Resistance Training Performed  Yes    VAD Patient?  No    PAD/SET Patient?  No      Pain Assessment   Currently in Pain?  No/denies    Pain Score  0-No pain    Multiple Pain Sites  No       Capillary Blood Glucose: No results found for this or any previous visit (from the past 24 hour(s)).    Social History   Tobacco Use  Smoking Status Never Smoker  Smokeless Tobacco Never Used    Goals Met:  Independence with exercise equipment Exercise tolerated well No report of cardiac concerns or symptoms Strength training completed today  Goals Unmet:  Not Applicable  Comments: Pt able to follow exercise prescription today without complaint.  Will continue to monitor for progression. Check out 915.   Dr. Kate Sable is Medical Director for [facility] Cardiac and Pulmonary Rehab.

## 2018-07-12 ENCOUNTER — Encounter (HOSPITAL_COMMUNITY)
Admission: RE | Admit: 2018-07-12 | Discharge: 2018-07-12 | Disposition: A | Discharge status: Home / Self Care | Payer: Medicare Other | Source: Ambulatory Visit | Attending: Cardiovascular Disease | Admitting: Cardiovascular Disease

## 2018-07-12 DIAGNOSIS — Z955 Presence of coronary angioplasty implant and graft: Secondary | ICD-10-CM

## 2018-07-12 NOTE — Progress Notes (Signed)
Daily Session Note  Patient Details  Name: Jodi Ray MRN: [id number] Date of Birth: Oct 25, 1960 Referring Provider:     CARDIAC REHAB PHASE II EXERCISE from 06/17/2018 in [place]  Referring Provider  Bronson Ing      Encounter Date: 07/12/2018  Check In: Session Check In - 07/12/18 0815      Check-In   Supervising physician immediately available to respond to emergencies  See telemetry face sheet for immediately available MD    Location  [facility]    Staff Present  Benay Pike, Exercise Physiologist;Debra Wynetta Emery, RN, BSN;Other    Medication changes reported      No    Fall or balance concerns reported     No    Warm-up and Cool-down  Performed as group-led instruction    Resistance Training Performed  Yes    VAD Patient?  No    PAD/SET Patient?  No      Pain Assessment   Currently in Pain?  No/denies    Pain Score  0-No pain    Multiple Pain Sites  No       Capillary Blood Glucose: No results found for this or any previous visit (from the past 24 hour(s)).    Social History   Tobacco Use  Smoking Status Never Smoker  Smokeless Tobacco Never Used    Goals Met:  Independence with exercise equipment Exercise tolerated well No report of cardiac concerns or symptoms Strength training completed today  Goals Unmet:  Not Applicable  Comments: Pt able to follow exercise prescription today without complaint.  Will continue to monitor for progression. Check out 0915.   Dr. Kate Sable is Medical Director for [facility] Cardiac and Pulmonary Rehab.

## 2018-07-15 ENCOUNTER — Encounter (HOSPITAL_COMMUNITY)
Admission: RE | Admit: 2018-07-15 | Discharge: 2018-07-15 | Disposition: A | Discharge status: Home / Self Care | Payer: Medicare Other | Source: Ambulatory Visit | Attending: Cardiovascular Disease | Admitting: Cardiovascular Disease

## 2018-07-15 DIAGNOSIS — Z955 Presence of coronary angioplasty implant and graft: Secondary | ICD-10-CM

## 2018-07-15 DIAGNOSIS — J301 Allergic rhinitis due to pollen: Secondary | ICD-10-CM | POA: Diagnosis not present

## 2018-07-15 DIAGNOSIS — J3089 Other allergic rhinitis: Secondary | ICD-10-CM | POA: Diagnosis not present

## 2018-07-15 DIAGNOSIS — J3081 Allergic rhinitis due to animal (cat) (dog) hair and dander: Secondary | ICD-10-CM | POA: Diagnosis not present

## 2018-07-15 NOTE — Progress Notes (Signed)
Daily Session Note  Patient Details  Name: Jodi Ray MRN: [id number] Date of Birth: 08-Sep-1960 Referring Provider:     CARDIAC REHAB PHASE II EXERCISE from 06/17/2018 in [place]  Referring Provider  Bronson Ing      Encounter Date: 07/15/2018  Check In: Session Check In - 07/15/18 0813      Check-In   Supervising physician immediately available to respond to emergencies  See telemetry face sheet for immediately available MD    Location  [facility]    Staff Present  Benay Pike, Exercise Physiologist;Jalon Blackwelder Wynetta Emery, RN, BSN;Other    Medication changes reported      No    Fall or balance concerns reported     No    Warm-up and Cool-down  Performed as group-led instruction    Resistance Training Performed  Yes    VAD Patient?  No    PAD/SET Patient?  No      Pain Assessment   Currently in Pain?  No/denies    Pain Score  0-No pain    Multiple Pain Sites  No       Capillary Blood Glucose: No results found for this or any previous visit (from the past 24 hour(s)).    Social History   Tobacco Use  Smoking Status Never Smoker  Smokeless Tobacco Never Used    Goals Met:  Independence with exercise equipment Exercise tolerated well No report of cardiac concerns or symptoms Strength training completed today  Goals Unmet:  Not Applicable  Comments: Pt able to follow exercise prescription today without complaint.  Will continue to monitor for progression. Check out 915.   Dr. Kate Sable is Medical Director for [facility] Card[facility] and Pulmonary Rehab.

## 2018-07-17 ENCOUNTER — Ambulatory Visit (HOSPITAL_COMMUNITY)
Admission: RE | Admit: 2018-07-17 | Discharge: 2018-07-17 | Disposition: A | Discharge status: Home / Self Care | Payer: Medicare Other | Source: Ambulatory Visit | Attending: Cardiovascular Disease | Admitting: Cardiovascular Disease

## 2018-07-17 ENCOUNTER — Encounter (HOSPITAL_COMMUNITY)
Admission: RE | Admit: 2018-07-17 | Discharge: 2018-07-17 | Disposition: A | Discharge status: Home / Self Care | Payer: Medicare Other | Source: Ambulatory Visit | Attending: Cardiovascular Disease | Admitting: Cardiovascular Disease

## 2018-07-17 ENCOUNTER — Telehealth: Payer: Self-pay | Admitting: Cardiovascular Disease

## 2018-07-17 DIAGNOSIS — R011 Cardiac murmur, unspecified: Secondary | ICD-10-CM | POA: Insufficient documentation

## 2018-07-17 DIAGNOSIS — Z955 Presence of coronary angioplasty implant and graft: Secondary | ICD-10-CM | POA: Diagnosis not present

## 2018-07-17 DIAGNOSIS — J301 Allergic rhinitis due to pollen: Secondary | ICD-10-CM | POA: Diagnosis not present

## 2018-07-17 DIAGNOSIS — J3089 Other allergic rhinitis: Secondary | ICD-10-CM | POA: Diagnosis not present

## 2018-07-17 DIAGNOSIS — J3081 Allergic rhinitis due to animal (cat) (dog) hair and dander: Secondary | ICD-10-CM | POA: Diagnosis not present

## 2018-07-17 MED ORDER — FUROSEMIDE 20 MG PO TABS: 20.0000 mg | ORAL_TABLET | ORAL | 3 refills | Status: DC | PRN

## 2018-07-17 NOTE — Telephone Encounter (Signed)
Pt made aware of echo results and to add lasix as needed for swelling and 3 lb weight gain over 24 hr period. She voiced understanding.

## 2018-07-17 NOTE — Telephone Encounter (Signed)
I stopped her lisinopril and hydrochlorothiazide to see if renal function would improve.  If she has developed increasing leg swelling/weight gain, I would have her take 20 mg of Lasix once.  She can then take this as needed for a 3 pound weight gain in 24 hours.

## 2018-07-17 NOTE — Telephone Encounter (Signed)
Called pt. No answer. Left message for pt to return call.  

## 2018-07-17 NOTE — Progress Notes (Signed)
*  PRELIMINARY RESULTS* Echocardiogram 2D Echocardiogram has been performed.  Jodi Ray 07/17/2018, 10:18 AM

## 2018-07-17 NOTE — Telephone Encounter (Signed)
Will forward to Dr. Koneswaran to advise.  

## 2018-07-17 NOTE — Progress Notes (Signed)
Daily Session Note  Patient Details  Name: Jodi Ray MRN: [id number] Date of Birth: 11/15/1960 Referring Provider:     CARDIAC REHAB PHASE II EXERCISE from 06/17/2018 in [place] [facility]  Referring Provider  Koneswaran      Encounter Date: 07/17/2018  Check In: Session Check In - 07/17/18 0826      Check-In   Supervising physician immediately available to respond to emergencies  See telemetry face sheet for immediately available MD    Location  [facility]    Staff Present  Amanda Ballard, Exercise Physiologist;Debra Johnson, RN, BSN;Other;Diane Coad, MS, EP, CHC, Exercise Physiologist    Medication changes reported      No    Fall or balance concerns reported     No    Tobacco Cessation  No Change    Warm-up and Cool-down  Performed as group-led instruction    Resistance Training Performed  Yes    VAD Patient?  No    PAD/SET Patient?  No      Pain Assessment   Currently in Pain?  No/denies    Pain Score  0-No pain    Multiple Pain Sites  No       Capillary Blood Glucose: No results found for this or any previous visit (from the past 24 hour(s)).    Social History   Tobacco Use  Smoking Status Never Smoker  Smokeless Tobacco Never Used    Goals Met:  Independence with exercise equipment Exercise tolerated well No report of cardiac concerns or symptoms Strength training completed today  Goals Unmet:  Not Applicable  Comments: Pt able to follow exercise prescription today without complaint.  Will continue to monitor for progression. Check out 0915.   Dr. Suresh Koneswaran is Medical Director for [facility] Card[facility] and Pulmonary Rehab. 

## 2018-07-17 NOTE — Telephone Encounter (Signed)
Pt has had a 4lb weight gain since Dr. Bronson Ing changed her medication last Wednesday.

## 2018-07-18 DIAGNOSIS — E113512 Type 2 diabetes mellitus with proliferative diabetic retinopathy with macular edema, left eye: Secondary | ICD-10-CM | POA: Diagnosis not present

## 2018-07-18 NOTE — Progress Notes (Signed)
Cardiac Individual Treatment Plan  Patient Details  Name: Jodi VALLADARES MRN: [id number] Date of Birth: May 30, 1961 Referring Provider:     CARDIAC REHAB PHASE II EXERCISE from 06/17/2018 in [place]  Referring Provider  Bronson Ing      Initial Encounter Date:    CARDIAC REHAB PHASE II EXERCISE from 06/17/2018 in [place]  Date  06/17/18      Visit Diagnosis: Status post coronary artery stent placement  Patient's Home Medications on Admission:  Current Outpatient Medications:  .  aspirin EC 81 MG tablet, Take 81 mg by mouth daily., Disp: , Rfl:  .  atorvastatin (LIPITOR) 80 MG tablet, Take 1 tablet (80 mg total) by mouth daily at 6 PM., Disp: 90 tablet, Rfl: 3 .  carvedilol (COREG) 3.125 MG tablet, Take 1 tablet (3.125 mg total) by mouth 2 (two) times daily., Disp: 180 tablet, Rfl: 3 .  Cholecalciferol (VITAMIN D-3) 5000 units TABS, Take 5,000 Units by mouth every 3 (three) days. , Disp: , Rfl:  .  clopidogrel (PLAVIX) 75 MG tablet, Take 1 tablet (75 mg total) by mouth daily with breakfast., Disp: 90 tablet, Rfl: 3 .  dicyclomine (BENTYL) 10 MG capsule, Take 10 mg by mouth 4 (four) times daily -  before meals and at bedtime., Disp: , Rfl:  .  EPINEPHrine 0.3 mg/0.3 mL IJ SOAJ injection, Inject 0.3 mg into the muscle as needed., Disp: , Rfl: 1 .  Fluocinolone Acetonide (DERMOTIC) 0.01 % OIL, Place 1 drop into both ears 3 (three) times a week. , Disp: , Rfl:  .  furosemide (LASIX) 20 MG tablet, Take 1 tablet (20 mg total) by mouth as needed., Disp: 30 tablet, Rfl: 3 .  gabapentin (NEURONTIN) 400 MG capsule, Take 1,200 mg by mouth at bedtime. , Disp: , Rfl:  .  Insulin Human (INSULIN PUMP) SOLN, Inject into the skin continuous. Novolog (up to 60 units as of 02/24/2018), Disp: , Rfl:  .  levocetirizine (XYZAL) 5 MG tablet, Take 5 mg by mouth daily with breakfast., Disp: , Rfl:  .  levothyroxine (SYNTHROID, LEVOTHROID) 125 MCG tablet,  Take 125 mcg by mouth daily before breakfast. , Disp: , Rfl: 0 .  loratadine (CLARITIN) 10 MG tablet, Take 10 mg by mouth 2 (two) times daily. , Disp: , Rfl:  .  metFORMIN (GLUCOPHAGE) 500 MG tablet, Take 1 tablet (500 mg total) by mouth 2 (two) times daily with a meal. Do not take 03/23/2018 and 03/24/2018. Restart taking from 03/25/2018., Disp: , Rfl:  .  Multiple Vitamins-Minerals (CENTRUM SILVER 50+WOMEN) TABS, Take 1 tablet by mouth daily. , Disp: , Rfl:  .  nitroGLYCERIN (NITROSTAT) 0.4 MG SL tablet, Place 1 tablet (0.4 mg total) under the tongue every 5 (five) minutes as needed for chest pain., Disp: 25 tablet, Rfl: 3 .  NOVOLOG 100 UNIT/ML injection, Inject into the skin continuous. Via insulin pump as directed up to 60 units (CGM), Disp: , Rfl: 0 .  Omega-3 Fatty Acids (FISH OIL) 1000 MG CAPS, Take 1,000 mg by mouth daily., Disp: , Rfl:  .  pantoprazole (PROTONIX) 40 MG tablet, Take 1 tablet (40 mg total) by mouth 2 (two) times daily before a meal., Disp: 60 tablet, Rfl: 0 .  Polyethylene Glycol 400 (BLINK TEARS) 0.25 % SOLN, Apply 1 drop to eye daily as needed (for eye irritation/dryness)., Disp: , Rfl:  .  topiramate (TOPAMAX) 50 MG tablet, TAKE 1 TABLET BY MOUTH ONCE DAILY (Patient taking  differently: Take 50 mg by mouth every evening. ), Disp: 90 tablet, Rfl: 0  Past Medical History: Past Medical History:  Diagnosis Date  . 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 Anemia   . Brain aneurysm    frontal lobe  . CAD (coronary artery disease)    a. 02/2018: s/p DES to Proximal LAD with residual 20% RCA stenosis.  . Chronic pain   . Diabetes mellitus without complication ([place])   . Headache   . History of left bundle branch block (LBBB)   . HTN (hypertension)   . Hx of cardiovascular stress test 10/2016   intermediate risk study  . Hypothyroidism   . Neuromuscular disorder ([place])   . Neuropathy   . Obesity   . OSA on CPAP 03/10/2015  . Retinopathy   . Sleep apnea   . Varicose veins of both lower  extremities     Tobacco Use: Social History   Tobacco Use  Smoking Status Never Smoker  Smokeless Tobacco Never Used    Labs: Recent Review Flowsheet Data    Labs for ITP Cardiac and Pulmonary Rehab Latest Ref Rng & Units 01/15/2015 07/23/2015 03/20/2018 04/16/2018   Cholestrol 0 - 200 mg/dL - - - 134   LDLCALC 0 - 99 mg/dL - - - 54   HDL >40 mg/dL - - - 75   Trlycerides <150 mg/dL - - - 23   Hemoglobin A1c 4.8 - 5.6 % 9.6(A) 9.1(H) 7.8(H) -      Capillary Blood Glucose: Lab Results  Component Value Date   GLUCAP 196 (H) 04/17/2018   GLUCAP 128 (H) 04/16/2018   GLUCAP 108 (H) 04/16/2018   GLUCAP 195 (H) 04/16/2018   GLUCAP 107 (H) 04/16/2018     Exercise Target Goals: Exercise Program Goal: Individual exercise prescription set using results from initial 6 min walk test and THRR while considering  patient's activity barriers and safety.   Exercise Prescription Goal: Starting with aerobic activity 30 plus minutes a day, 3 days per week for initial exercise prescription. Provide home exercise prescription and guidelines that participant acknowledges understanding prior to discharge.  Activity Barriers & Risk Stratification: Activity Barriers & Cardiac Risk Stratification - 06/17/18 1413      Activity Barriers & Cardiac Risk Stratification   Activity Barriers  Balance Concerns;Muscular Weakness;Other (comment)    Comments  bilateral foot problems     Cardiac Risk Stratification  High       6 Minute Walk: 6 Minute Walk    Row Name 06/17/18 1411         6 Minute Walk   Phase  Initial     Distance  900 feet     Walk Time  6 minutes     # of Rest Breaks  0     MPH  1.7     METS  2.3     RPE  7     Perceived Dyspnea   9     VO2 Peak  9.54     Symptoms  No     Resting HR  94 bpm     Resting BP  116/60     Resting Oxygen Saturation   94 %     Exercise Oxygen Saturation  during 6 min walk  98 %     Max Ex. HR  134 bpm     Max Ex. BP  162/68     2 Minute Post  BP  120/60  Oxygen Initial Assessment:   Oxygen Re-Evaluation:   Oxygen Discharge (Final Oxygen Re-Evaluation):   Initial Exercise Prescription: Initial Exercise Prescription - 06/17/18 1400      Date of Initial Exercise RX and Referring Provider   Date  06/17/18    Referring Provider  Koneswaran    Expected Discharge Date  09/17/18      NuStep   Level  1    SPM  102    Minutes  17    METs  2.2      Arm Ergometer   Level  1    Watts  17    RPM  60    Minutes  17    METs  2.1      Prescription Details   Frequency (times per week)  3    Duration  Progress to 30 minutes of continuous aerobic without signs/symptoms of physical distress      Intensity   THRR 40-80% of Max Heartrate  [phone]    Ratings of Perceived Exertion  11-13    Perceived Dyspnea  0-4      Progression   Progression  Continue to progress workloads to maintain intensity without signs/symptoms of physical distress.      Resistance Training   Training Prescription  Yes    Weight  1    Reps  10-15       Perform Capillary Blood Glucose checks as needed.  Exercise Prescription Changes:  Exercise Prescription Changes    Row Name 07/03/18 1400 07/16/18 1500           Response to Exercise   Blood Pressure (Admit)  152/64  122/68      Blood Pressure (Exercise)  184/70  172/70      Blood Pressure (Exit)  130/58  116/62      Heart Rate (Admit)  81 bpm  72 bpm      Heart Rate (Exercise)  113 bpm  99 bpm      Heart Rate (Exit)  96 bpm  78 bpm      Rating of Perceived Exertion (Exercise)  13  11      Comments  First two weeks of exercise!   -      Duration  Progress to 30 minutes of  aerobic without signs/symptoms of physical distress  Progress to 30 minutes of  aerobic without signs/symptoms of physical distress      Intensity  THRR New 120-134-147  THRR unchanged        Progression   Progression  Continue to progress workloads to maintain intensity without signs/symptoms of  physical distress.  Continue to progress workloads to maintain intensity without signs/symptoms of physical distress.      Average METs  2.7  2.9        Resistance Training   Training Prescription  Yes  Yes      Weight  1  2      Reps  10-15  10-15        NuStep   Level  1  2      SPM  93  98      Minutes  22  22      METs  2.2  2.3        Arm Ergometer   Level  1.8  2      Watts  30  37      RPM  55  79      Minutes  17  17      METs  3.2  3.8         Exercise Comments:  Exercise Comments    Row Name 07/16/18 1532           Exercise Comments  Pt. has done great in rehab so far during her first 9 sessions. She has tolerated the exercise well and pushes herself to increase workload every day. We will continue to progress her as tolerated.           Exercise Goals and Review:  Exercise Goals    Row Name 06/17/18 1415             Exercise Goals   Increase Physical Activity  Yes       Intervention  Provide advice, education, support and counseling about physical activity/exercise needs.;Develop an individualized exercise prescription for aerobic and resistive training based on initial evaluation findings, risk stratification, comorbidities and participant's personal goals.       Expected Outcomes  Short Term: Attend rehab on a regular basis to increase amount of physical activity.       Increase Strength and Stamina  Yes       Intervention  Provide advice, education, support and counseling about physical activity/exercise needs.;Develop an individualized exercise prescription for aerobic and resistive training based on initial evaluation findings, risk stratification, comorbidities and participant's personal goals.       Expected Outcomes  Short Term: Increase workloads from initial exercise prescription for resistance, speed, and METs.       Able to understand and use rate of perceived exertion (RPE) scale  Yes       Intervention  Provide education and explanation on  how to use RPE scale       Expected Outcomes  Short Term: Able to use RPE daily in rehab to express subjective intensity level;Long Term:  Able to use RPE to guide intensity level when exercising independently       Able to understand and use Dyspnea scale  Yes       Intervention  Provide education and explanation on how to use Dyspnea scale       Expected Outcomes  Short Term: Able to use Dyspnea scale daily in rehab to express subjective sense of shortness of breath during exertion;Long Term: Able to use Dyspnea scale to guide intensity level when exercising independently       Knowledge and understanding of Target Heart Rate Range (THRR)  Yes       Intervention  Provide education and explanation of THRR including how the numbers were predicted and where they are located for reference       Expected Outcomes  Long Term: Able to use THRR to govern intensity when exercising independently;Short Term: Able to state/look up THRR;Short Term: Able to use daily as guideline for intensity in rehab       Able to check pulse independently  Yes       Intervention  Provide education and demonstration on how to check pulse in carotid and radial arteries.;Review the importance of being able to check your own pulse for safety during independent exercise       Expected Outcomes  Short Term: Able to explain why pulse checking is important during independent exercise;Long Term: Able to check pulse independently and accurately       Understanding of Exercise Prescription  Yes       Intervention  Provide education, explanation, and written materials on patient's individual exercise  prescription       Expected Outcomes  Short Term: Able to explain program exercise prescription;Long Term: Able to explain home exercise prescription to exercise independently          Exercise Goals Re-Evaluation : Exercise Goals Re-Evaluation    Row Name 07/16/18 1531             Exercise Goal Re-Evaluation   Exercise Goals  Review  Increase Physical Activity;Increase Strength and Stamina;Able to understand and use rate of perceived exertion (RPE) scale;Knowledge and understanding of Target Heart Rate Range (THRR);Able to check pulse independently;Understanding of Exercise Prescription;Improve claudication pain tolerance and improve walking ability       Comments  Pt. has done well in the program so far, she has attended 9 sessions. She has tolerated the exercise and progressions well and is eager to come in, work hard and push herself.        Expected Outcomes  Improve walking, walk without pain, lose weight.            Discharge Exercise Prescription (Final Exercise Prescription Changes): Exercise Prescription Changes - 07/16/18 1500      Response to Exercise   Blood Pressure (Admit)  122/68    Blood Pressure (Exercise)  172/70    Blood Pressure (Exit)  116/62    Heart Rate (Admit)  72 bpm    Heart Rate (Exercise)  99 bpm    Heart Rate (Exit)  78 bpm    Rating of Perceived Exertion (Exercise)  11    Duration  Progress to 30 minutes of  aerobic without signs/symptoms of physical distress    Intensity  THRR unchanged      Progression   Progression  Continue to progress workloads to maintain intensity without signs/symptoms of physical distress.    Average METs  2.9      Resistance Training   Training Prescription  Yes    Weight  2    Reps  10-15      NuStep   Level  2    SPM  98    Minutes  22    METs  2.3      Arm Ergometer   Level  2    Watts  37    RPM  79    Minutes  17    METs  3.8       Nutrition:  Target Goals: Understanding of nutrition guidelines, daily intake of sodium 1500mg , cholesterol 200mg , calories 30% from fat and 7% or less from saturated fats, daily to have 5 or more servings of fruits and vegetables.  Biometrics: Pre Biometrics - 06/17/18 1416      Pre Biometrics   Height  5\' 8"  (1.727 m)    Waist Circumference  44 inches    Hip Circumference  54 inches     Waist to Hip Ratio  0.81 %    Triceps Skinfold  23 mm    % Body Fat  46 %    Grip Strength  17.5 kg    Flexibility  0 in    Single Leg Stand  1.5 seconds        Nutrition Therapy Plan and Nutrition Goals: Nutrition Therapy & Goals - 07/18/18 0826      Nutrition Therapy   RD appointment deferred  Yes      Personal Nutrition Goals   Comments  Patient hopes to attend next RD appointment. She is following a diabetic diet. Will continue to monitor  for progress.        Nutrition Assessments: Nutrition Assessments - 06/17/18 1529      MEDFICTS Scores   Pre Score  6       Nutrition Goals Re-Evaluation:   Nutrition Goals Discharge (Final Nutrition Goals Re-Evaluation):   Psychosocial: Target Goals: Acknowledge presence or absence of significant depression and/or stress, maximize coping skills, provide positive support system. Participant is able to verbalize types and ability to use techniques and skills needed for reducing stress and depression.  Initial Review & Psychosocial Screening: Initial Psych Review & Screening - 06/17/18 1521      Initial Review   Current issues with  None Identified      Family Dynamics   Good Support System?  Yes      Barriers   Psychosocial barriers to participate in program  There are no identifiable barriers or psychosocial needs.      Screening Interventions   Interventions  Encouraged to exercise    Expected Outcomes  Short Term goal: Identification and review with participant of any Quality of Life or Depression concerns found by scoring the questionnaire.;Long Term goal: The participant improves quality of Life and PHQ9 Scores as seen by post scores and/or verbalization of changes       Quality of Life Scores: Quality of Life - 06/17/18 1420      Quality of Life   Select  Quality of Life      Quality of Life Scores   Health/Function Pre  24.41 %    Socioeconomic Pre  20.36 %    Psych/Spiritual Pre  26.43 %    Family Pre  30  %    GLOBAL Pre  24.08 %      Scores of 19 and below usually indicate a poorer quality of life in these areas.  A difference of  2-3 points is a clinically meaningful difference.  A difference of 2-3 points in the total score of the Quality of Life Index has been associated with significant improvement in overall quality of life, self-image, physical symptoms, and general health in studies assessing change in quality of life.  PHQ-9: Recent Review Flowsheet Data    Depression screen [facility] 2/9 06/17/2018 04/09/2017 08/04/2015 04/30/2015 05/18/2014   Decreased Interest 0 0 0 0 0   Down, Depressed, Hopeless 0 0 0 0 0   PHQ - 2 Score 0 0 0 0 0   Altered sleeping 0 - - - -   Tired, decreased energy 0 - - - -   Change in appetite 0 - - - -   Feeling bad or failure about yourself  0 - - - -   Trouble concentrating 0 - - - -   Moving slowly or fidgety/restless 0 - - - -   Suicidal thoughts 0 - - - -   PHQ-9 Score 0 - - - -   Difficult doing work/chores Not difficult at all - - - -     Interpretation of Total Score  Total Score Depression Severity:  1-4 = Minimal depression, 5-9 = Mild depression, 10-14 = Moderate depression, 15-19 = Moderately severe depression, 20-27 = Severe depression   Psychosocial Evaluation and Intervention: Psychosocial Evaluation - 06/17/18 1523      Psychosocial Evaluation & Interventions   Interventions  Encouraged to exercise with the program and follow exercise prescription    Continue Psychosocial Services   No Follow up required       Psychosocial Re-Evaluation: Psychosocial Re-Evaluation  [place] Name 07/18/18 [phone]             Psychosocial Re-Evaluation   Current issues with  None Identified       Comments  Patient's initial QOL score was 24.08 and her PHQ-9 score was 0 with no psychosocial issues identified.        Expected Outcomes  Patient will have no psychosocial issues identified at discharge.        Interventions  Stress management  education;Encouraged to attend Cardiac Rehabilitation for the exercise;Relaxation education       Continue Psychosocial Services   No Follow up required          Psychosocial Discharge (Final Psychosocial Re-Evaluation): Psychosocial Re-Evaluation - 07/18/18 4010      Psychosocial Re-Evaluation   Current issues with  None Identified    Comments  Patient's initial QOL score was 24.08 and her PHQ-9 score was 0 with no psychosocial issues identified.     Expected Outcomes  Patient will have no psychosocial issues identified at discharge.     Interventions  Stress management education;Encouraged to attend Cardiac Rehabilitation for the exercise;Relaxation education    Continue Psychosocial Services   No Follow up required       Vocational Rehabilitation: Provide vocational rehab assistance to qualifying candidates.   Vocational Rehab Evaluation & Intervention: Vocational Rehab - 06/17/18 1530      Initial Vocational Rehab Evaluation & Intervention   Assessment shows need for Vocational Rehabilitation  No       Education: Education Goals: Education classes will be provided on a weekly basis, covering required topics. Participant will state understanding/return demonstration of topics presented.  Learning Barriers/Preferences: Learning Barriers/Preferences - 06/17/18 1529      Learning Barriers/Preferences   Learning Barriers  None    Learning Preferences  Audio;Computer/Internet;Group Instruction;Individual Instruction;Pictoral;Skilled Demonstration;Verbal Instruction;Video;Written Material       Education Topics: Hypertension, Hypertension Reduction -Define heart disease and high blood pressure. Discus how high blood pressure affects the body and ways to reduce high blood pressure.   Exercise and Your Heart -Discuss why it is important to exercise, the FITT principles of exercise, normal and abnormal responses to exercise, and how to exercise safely.   Angina -Discuss  definition of angina, causes of angina, treatment of angina, and how to decrease risk of having angina.   Cardiac Medications -Review what the following cardiac medications are used for, how they affect the body, and side effects that may occur when taking the medications.  Medications include Aspirin, Beta blockers, calcium channel blockers, ACE Inhibitors, angiotensin receptor blockers, diuretics, digoxin, and antihyperlipidemics.   CARDIAC REHAB PHASE II EXERCISE from 07/17/2018 in [place]  Date  06/26/18  Educator  Etheleen Mayhew  Instruction Review Code  2- Demonstrated Understanding      Congestive Heart Failure -Discuss the definition of CHF, how to live with CHF, the signs and symptoms of CHF, and how keep track of weight and sodium intake.   CARDIAC REHAB PHASE II EXERCISE from 07/17/2018 in [place]  Date  07/03/18  Educator  Wynetta Emery  Instruction Review Code  2- Demonstrated Understanding      Heart Disease and Intimacy -Discus the effect sexual activity has on the heart, how changes occur during intimacy as we age, and safety during sexual activity.   CARDIAC REHAB PHASE II EXERCISE from 07/17/2018 in [place]  Date  07/10/18  Educator  Etheleen Mayhew  Instruction Review Code  2- Demonstrated Understanding      Smoking Cessation / COPD -Discuss different methods to quit smoking, the health benefits of quitting smoking, and the definition of COPD.   CARDIAC REHAB PHASE II EXERCISE from 07/17/2018 in [place]  Date  07/17/18  Educator  Etheleen Mayhew   Instruction Review Code  2- Demonstrated Understanding      Nutrition I: Fats -Discuss the types of cholesterol, what cholesterol does to the heart, and how cholesterol levels can be controlled.   Nutrition II: Labels -Discuss the different components of food labels and how to read food label   Heart Parts/Heart Disease and  PAD -Discuss the anatomy of the heart, the pathway of blood circulation through the heart, and these are affected by heart disease.   Stress I: Signs and Symptoms -Discuss the causes of stress, how stress may lead to anxiety and depression, and ways to limit stress.   Stress II: Relaxation -Discuss different types of relaxation techniques to limit stress.   Warning Signs of Stroke / TIA -Discuss definition of a stroke, what the signs and symptoms are of a stroke, and how to identify when someone is having stroke.   Knowledge Questionnaire Score: Knowledge Questionnaire Score - 06/17/18 1530      Knowledge Questionnaire Score   Pre Score  23/24       Core Components/Risk Factors/Patient Goals at Admission: Personal Goals and Risk Factors at Admission - 06/17/18 1530      Core Components/Risk Factors/Patient Goals on Admission    Weight Management  Yes    Intervention  Weight Management/Obesity: Establish reasonable short term and long term weight goals.    Admit Weight  261 lb (118.4 kg)    Goal Weight: Short Term  246 lb (111.6 kg)    Goal Weight: Long Term  231 lb (104.8 kg)    Expected Outcomes  Short Term: Continue to assess and modify interventions until short term weight is achieved;Long Term: Adherence to nutrition and physical activity/exercise program aimed toward attainment of established weight goal    Personal Goal Other  Yes    Personal Goal  Be able to walk further and lose 30lbs    Intervention  Attend program 3 x week and supplement with 2 days week at home stretching and hand heald weights.    Expected Outcomes  Reach personal goals.        Core Components/Risk Factors/Patient Goals Review:  Goals and Risk Factor Review    Row Name 07/18/18 0827             Core Components/Risk Factors/Patient Goals Review   Personal Goals Review  Weight Management/Obesity;Diabetes Walk further; lose 30 lbs.        Review  Patient has completed 10 sessions  maintaining her weight since her initial visit. She is doing well in the program with progression. She was seen in the ED 07/16/18 with a near syncope episode transported by EMS. She was hypotensive and later evaluated by her cardiologist. He adjusted her medication. Her b/p have been stable. Her last A1C on file was 7.8 on 03/20/18. Her fasting reported glucose readings are usually below 120. Will continue to monitor for progress.        Expected Outcomes  Patient will continue to attend sessions and complete the program meeting her personal goals.           Core Components/Risk Factors/Patient Goals at Discharge (Final Review):  Goals and Risk Factor Review -  07/18/18 0827      Core Components/Risk Factors/Patient Goals Review   Personal Goals Review  Weight Management/Obesity;Diabetes   Walk further; lose 30 lbs.    Review  Patient has completed 10 sessions maintaining her weight since her initial visit. She is doing well in the program with progression. She was seen in the ED 07/16/18 with a near syncope episode transported by EMS. She was hypotensive and later evaluated by her cardiologist. He adjusted her medication. Her b/p have been stable. Her last A1C on file was 7.8 on 03/20/18. Her fasting reported glucose readings are usually below 120. Will continue to monitor for progress.     Expected Outcomes  Patient will continue to attend sessions and complete the program meeting her personal goals.        ITP Comments: ITP Comments    Row Name 07/01/18 1012           ITP Comments  Patient new to program. She has completed 4 sessions. Will continue to monitor for progress.           Comments: ITP REVIEW Patient is doing well in the program. Will continue to monitor for progress.

## 2018-07-19 ENCOUNTER — Encounter (HOSPITAL_COMMUNITY)
Admission: RE | Admit: 2018-07-19 | Discharge: 2018-07-19 | Disposition: A | Discharge status: Home / Self Care | Payer: Medicare Other | Source: Ambulatory Visit | Attending: Cardiovascular Disease | Admitting: Cardiovascular Disease

## 2018-07-19 ENCOUNTER — Other Ambulatory Visit (HOSPITAL_COMMUNITY)
Admission: RE | Admit: 2018-07-19 | Discharge: 2018-07-19 | Disposition: A | Discharge status: Home / Self Care | Payer: Medicare Other | Source: Ambulatory Visit | Attending: Cardiovascular Disease | Admitting: Cardiovascular Disease

## 2018-07-19 DIAGNOSIS — Z955 Presence of coronary angioplasty implant and graft: Secondary | ICD-10-CM

## 2018-07-19 DIAGNOSIS — I10 Essential (primary) hypertension: Secondary | ICD-10-CM | POA: Insufficient documentation

## 2018-07-19 LAB — BASIC METABOLIC PANEL
Anion gap: 8 (ref 5–15)
BUN: 31 mg/dL — ABNORMAL HIGH (ref 6–20)
CALCIUM: 9.3 mg/dL (ref 8.9–10.3)
CO2: 22 mmol/L (ref 22–32)
CREATININE: 1.07 mg/dL — AB (ref 0.44–1.00)
Chloride: 108 mmol/L (ref 98–111)
GFR calc non Af Amer: 57 mL/min — ABNORMAL LOW (ref >60)
GLUCOSE: 126 mg/dL — AB (ref 70–99)
Potassium: 4.5 mmol/L (ref 3.5–5.1)
Sodium: 138 mmol/L (ref 135–145)

## 2018-07-19 NOTE — Progress Notes (Signed)
Incomplete Session Note  Patient Details  Name: Jodi Ray MRN: [id number] Date of Birth: 11/17/1960 Referring Provider:     CARDIAC REHAB PHASE II EXERCISE from 06/17/2018 in [place]  Referring Provider  Erial Fikes Yearby did not complete her rehab session.  Patients reports a glucose of 69 at check in. She said it was 28 when she woke up. She is on an insulin pump and she had just turned her pump off before check in. She said she ate breakfast and had taken a glucose tablet. She was advised not to exercise today. She was given an OJ and was going to wait in the lobby for lab work she was scheduled for.

## 2018-07-22 ENCOUNTER — Encounter (HOSPITAL_COMMUNITY)
Admission: RE | Admit: 2018-07-22 | Discharge: 2018-07-22 | Disposition: A | Discharge status: Home / Self Care | Payer: Medicare Other | Source: Ambulatory Visit | Attending: Cardiovascular Disease | Admitting: Cardiovascular Disease

## 2018-07-22 DIAGNOSIS — Z955 Presence of coronary angioplasty implant and graft: Secondary | ICD-10-CM

## 2018-07-22 DIAGNOSIS — J301 Allergic rhinitis due to pollen: Secondary | ICD-10-CM | POA: Diagnosis not present

## 2018-07-22 DIAGNOSIS — J3081 Allergic rhinitis due to animal (cat) (dog) hair and dander: Secondary | ICD-10-CM | POA: Diagnosis not present

## 2018-07-22 DIAGNOSIS — J3089 Other allergic rhinitis: Secondary | ICD-10-CM | POA: Diagnosis not present

## 2018-07-22 NOTE — Progress Notes (Addendum)
Daily Session Note  Patient Details  Name: Jodi Ray MRN: [id number] Date of Birth: Dec 01, 1960 Referring Provider:     [facility] from 06/17/2018 in [place]  Referring Provider  Bronson Ing      Encounter Date: 07/22/2018  Check In: Session Check In - 07/22/18 0815      Check-In   Supervising physician immediately available to respond to emergencies  See telemetry face sheet for immediately available MD    Location  [facility]    Staff Present  Benay Pike, Exercise Physiologist;Shenia Alan Wynetta Emery, RN, BSN;Diane Coad, MS, EP, [facility], Exercise Physiologist    Medication changes reported      Yes    Comments  Added Lasix 20 mg as needed.     Fall or balance concerns reported     No    Warm-up and Cool-down  Performed as group-led instruction    Resistance Training Performed  Yes    VAD Patient?  No    PAD/SET Patient?  No      Pain Assessment   Currently in Pain?  No/denies    Pain Score  0-No pain    Multiple Pain Sites  No       Capillary Blood Glucose: No results found for this or any previous visit (from the past 24 hour(s)).    Social History   Tobacco Use  Smoking Status Never Smoker  Smokeless Tobacco Never Used    Goals Met:  Independence with exercise equipment Exercise tolerated well No report of cardiac concerns or symptoms Strength training completed today  Goals Unmet:  Not Applicable  Comments: Pt able to follow exercise prescription today without complaint.  Will continue to monitor for progression. Check out 915.   Dr. Kate Sable is Medical Director for [facility] Cardiac and Pulmonary Rehab.

## 2018-07-24 ENCOUNTER — Encounter (HOSPITAL_COMMUNITY): Payer: Medicare Other

## 2018-07-26 ENCOUNTER — Encounter (HOSPITAL_COMMUNITY)
Admission: RE | Admit: 2018-07-26 | Discharge: 2018-07-26 | Disposition: A | Discharge status: Home / Self Care | Payer: Medicare Other | Source: Ambulatory Visit | Attending: Cardiovascular Disease | Admitting: Cardiovascular Disease

## 2018-07-26 DIAGNOSIS — Z955 Presence of coronary angioplasty implant and graft: Secondary | ICD-10-CM | POA: Diagnosis not present

## 2018-07-26 DIAGNOSIS — J3081 Allergic rhinitis due to animal (cat) (dog) hair and dander: Secondary | ICD-10-CM | POA: Diagnosis not present

## 2018-07-26 DIAGNOSIS — J3089 Other allergic rhinitis: Secondary | ICD-10-CM | POA: Diagnosis not present

## 2018-07-26 DIAGNOSIS — J301 Allergic rhinitis due to pollen: Secondary | ICD-10-CM | POA: Diagnosis not present

## 2018-07-26 NOTE — Progress Notes (Signed)
Daily Session Note  Patient Details  Name: Jodi Ray MRN: [id number] Date of Birth: 08/13/1960 Referring Provider:     CARDIAC REHAB PHASE II EXERCISE from 06/17/2018 in [place]  Referring Provider  Bronson Ing      Encounter Date: 07/26/2018  Check In: Session Check In - 07/26/18 0815      Check-In   Supervising physician immediately available to respond to emergencies  See telemetry face sheet for immediately available MD    Location  [facility]    Staff Present  Benay Pike, Exercise Physiologist;Diane Coad, MS, EP, [facility], Exercise Physiologist;Other    Medication changes reported      No    Fall or balance concerns reported     No    Tobacco Cessation  No Change    Warm-up and Cool-down  Performed as group-led instruction    Resistance Training Performed  Yes    VAD Patient?  No    PAD/SET Patient?  No      Pain Assessment   Currently in Pain?  No/denies    Pain Score  0-No pain    Multiple Pain Sites  No       Capillary Blood Glucose: No results found for this or any previous visit (from the past 24 hour(s)).    Social History   Tobacco Use  Smoking Status Never Smoker  Smokeless Tobacco Never Used    Goals Met:  Independence with exercise equipment Exercise tolerated well No report of cardiac concerns or symptoms Strength training completed today  Goals Unmet:  Not Applicable  Comments: Pt able to follow exercise prescription today without complaint.  Will continue to monitor for progression. Check out 0915.   Dr. Kate Sable is Medical Director for [facility] Cardiac and Pulmonary Rehab.

## 2018-07-29 ENCOUNTER — Encounter (HOSPITAL_COMMUNITY)
Admission: RE | Admit: 2018-07-29 | Discharge: 2018-07-29 | Disposition: A | Discharge status: Home / Self Care | Payer: Medicare Other | Source: Ambulatory Visit | Attending: Cardiovascular Disease | Admitting: Cardiovascular Disease

## 2018-07-29 DIAGNOSIS — Z955 Presence of coronary angioplasty implant and graft: Secondary | ICD-10-CM

## 2018-07-29 DIAGNOSIS — J3081 Allergic rhinitis due to animal (cat) (dog) hair and dander: Secondary | ICD-10-CM | POA: Diagnosis not present

## 2018-07-29 DIAGNOSIS — J301 Allergic rhinitis due to pollen: Secondary | ICD-10-CM | POA: Diagnosis not present

## 2018-07-29 DIAGNOSIS — J3089 Other allergic rhinitis: Secondary | ICD-10-CM | POA: Diagnosis not present

## 2018-07-29 NOTE — Progress Notes (Signed)
Daily Session Note  Patient Details  Name: DEVENEY BAYON MRN: [id number] Date of Birth: [birth year] Referring Provider:     CARDIAC REHAB PHASE II EXERCISE from 06/17/2018 in [place]  Referring Provider  Bronson Ing      Encounter Date: 07/29/2018  Check In: Session Check In - 07/29/18 0849      Check-In   Supervising physician immediately available to respond to emergencies  See telemetry face sheet for immediately available MD    Location  [facility]    Staff Present  Benay Pike, Exercise Physiologist;Diane Coad, MS, EP, [facility], Exercise Physiologist;Other    Medication changes reported      No    Fall or balance concerns reported     No    Tobacco Cessation  No Change    Warm-up and Cool-down  Performed as group-led instruction    Resistance Training Performed  Yes    VAD Patient?  No    PAD/SET Patient?  No      Pain Assessment   Currently in Pain?  No/denies    Pain Score  0-No pain    Multiple Pain Sites  No       Capillary Blood Glucose: No results found for this or any previous visit (from the past 24 hour(s)).    Social History   Tobacco Use  Smoking Status Never Smoker  Smokeless Tobacco Never Used    Goals Met:  Independence with exercise equipment Exercise tolerated well No report of cardiac concerns or symptoms Strength training completed today  Goals Unmet:  Not Applicable  Comments: Pt able to follow exercise prescription today without complaint.  Will continue to monitor for progression. Check out 0915.   Dr. Kate Sable is Medical Director for [facility] Cardiac and Pulmonary Rehab.

## 2018-07-30 DIAGNOSIS — E119 Type 2 diabetes mellitus without complications: Secondary | ICD-10-CM | POA: Diagnosis not present

## 2018-07-30 DIAGNOSIS — I10 Essential (primary) hypertension: Secondary | ICD-10-CM | POA: Diagnosis not present

## 2018-07-30 DIAGNOSIS — M159 Polyosteoarthritis, unspecified: Secondary | ICD-10-CM | POA: Diagnosis not present

## 2018-07-31 ENCOUNTER — Encounter (HOSPITAL_COMMUNITY): Payer: Medicare Other

## 2018-08-02 ENCOUNTER — Encounter (HOSPITAL_COMMUNITY)
Admission: RE | Admit: 2018-08-02 | Discharge: 2018-08-02 | Disposition: A | Discharge status: Home / Self Care | Payer: Medicare Other | Source: Ambulatory Visit | Attending: Cardiovascular Disease | Admitting: Cardiovascular Disease

## 2018-08-02 DIAGNOSIS — Z955 Presence of coronary angioplasty implant and graft: Secondary | ICD-10-CM

## 2018-08-02 NOTE — Progress Notes (Signed)
Daily Session Note  Patient Details  Name: Jodi Ray MRN: [id number] Date of Birth: 02-17-1961 Referring Provider:     CARDIAC REHAB PHASE II EXERCISE from 06/17/2018 in [place]  Referring Provider  Bronson Ing      Encounter Date: 08/02/2018  Check In: Session Check In - 08/02/18 0815      Check-In   Supervising physician immediately available to respond to emergencies  See telemetry face sheet for immediately available MD    Location  [facility]    Staff Present  Benay Pike, Exercise Physiologist;Diane Coad, MS, EP, [facility], Exercise Physiologist;Debra Wynetta Emery, RN, BSN    Medication changes reported      No    Fall or balance concerns reported     No    Tobacco Cessation  No Change    Warm-up and Cool-down  Performed as group-led instruction    Resistance Training Performed  Yes    VAD Patient?  No    PAD/SET Patient?  No      Pain Assessment   Currently in Pain?  No/denies    Pain Score  0-No pain    Multiple Pain Sites  No       Capillary Blood Glucose: No results found for this or any previous visit (from the past 24 hour(s)).    Social History   Tobacco Use  Smoking Status Never Smoker  Smokeless Tobacco Never Used    Goals Met:  Independence with exercise equipment Exercise tolerated well No report of cardiac concerns or symptoms Strength training completed today  Goals Unmet:  Not Applicable  Comments: Pt able to follow exercise prescription today without complaint.  Will continue to monitor for progression. Check out 0915.   Dr. Kate Sable is Medical Director for [facility] Cardiac and Pulmonary Rehab.

## 2018-08-05 ENCOUNTER — Encounter (HOSPITAL_COMMUNITY)
Admission: RE | Admit: 2018-08-05 | Discharge: 2018-08-05 | Disposition: A | Discharge status: Home / Self Care | Payer: Medicare Other | Source: Ambulatory Visit | Attending: Cardiovascular Disease | Admitting: Cardiovascular Disease

## 2018-08-05 ENCOUNTER — Telehealth: Payer: Self-pay | Admitting: Neurology

## 2018-08-05 DIAGNOSIS — Z955 Presence of coronary angioplasty implant and graft: Secondary | ICD-10-CM

## 2018-08-05 NOTE — Progress Notes (Signed)
Daily Session Note  Patient Details  Name: Jodi Ray MRN: [id number] Date of Birth: [birth year] Referring Provider:     CARDIAC REHAB PHASE II EXERCISE from 06/17/2018 in [place]  Referring Provider  Bronson Ing      Encounter Date: 08/05/2018  Check In: Session Check In - 08/05/18 0811      Check-In   Supervising physician immediately available to respond to emergencies  See telemetry face sheet for immediately available MD    Location  [facility]    Staff Present  Benay Pike, Exercise Physiologist;Diane Coad, MS, EP, [facility], Exercise Physiologist;Mar Walmer Wynetta Emery, RN, BSN    Medication changes reported      No    Fall or balance concerns reported     No    Warm-up and Cool-down  Performed as group-led instruction    Resistance Training Performed  Yes    VAD Patient?  No    PAD/SET Patient?  No      Pain Assessment   Currently in Pain?  No/denies    Pain Score  0-No pain    Multiple Pain Sites  No       Capillary Blood Glucose: No results found for this or any previous visit (from the past 24 hour(s)).    Social History   Tobacco Use  Smoking Status Never Smoker  Smokeless Tobacco Never Used    Goals Met:  Independence with exercise equipment Exercise tolerated well No report of cardiac concerns or symptoms Strength training completed today  Goals Unmet:  Not Applicable  Comments: Pt able to follow exercise prescription today without complaint.  Will continue to monitor for progression. Check out 915.   Dr. Kate Sable is Medical Director for [facility] Cardiac and Pulmonary Rehab.

## 2018-08-05 NOTE — Telephone Encounter (Signed)
We can send a prescription for muscle relaxant, such as tizanidine 2mg  at bedtime and see if this helps.  Avoid NSAIDs due to mild elevation in creatinine.

## 2018-08-05 NOTE — Telephone Encounter (Signed)
Called and spoke with Pt. She described the same type of headache she has been treated for. I advised her we can add her to our wait list and call her if anyone cancels. Pt has taken Tylenol several times without any relief. She was concerned this headache may be related to her MI she had in August. I reminded her she has had this same kind of headache in the past and she agreed. She said it was triggered by the change in the weather.  Pt is questioning if there is another medication she can take to relieve the pain.

## 2018-08-05 NOTE — Telephone Encounter (Signed)
Patient called regarding her Headaches. She is scheduled for a Follow Up in May. She said she has had it for over a week. Please Call. Thanks

## 2018-08-06 MED ORDER — TOPIRAMATE 50 MG PO TABS: 50.0000 mg | ORAL_TABLET | Freq: Every day | ORAL | 1 refills | Status: DC

## 2018-08-06 MED ORDER — TIZANIDINE HCL 2 MG PO CAPS: 2.0000 mg | ORAL_CAPSULE | Freq: Every day | ORAL | 1 refills | Status: DC

## 2018-08-06 NOTE — Telephone Encounter (Signed)
Called and advised Pt, she also requested refill on topiramate and sent to [facility] in [place].

## 2018-08-07 ENCOUNTER — Encounter (HOSPITAL_COMMUNITY)
Admission: RE | Admit: 2018-08-07 | Discharge: 2018-08-07 | Disposition: A | Discharge status: Home / Self Care | Payer: Medicare Other | Source: Ambulatory Visit | Attending: Cardiovascular Disease | Admitting: Cardiovascular Disease

## 2018-08-07 DIAGNOSIS — Z955 Presence of coronary angioplasty implant and graft: Secondary | ICD-10-CM

## 2018-08-07 DIAGNOSIS — J301 Allergic rhinitis due to pollen: Secondary | ICD-10-CM | POA: Diagnosis not present

## 2018-08-07 DIAGNOSIS — J3089 Other allergic rhinitis: Secondary | ICD-10-CM | POA: Diagnosis not present

## 2018-08-07 DIAGNOSIS — J3081 Allergic rhinitis due to animal (cat) (dog) hair and dander: Secondary | ICD-10-CM | POA: Diagnosis not present

## 2018-08-07 NOTE — Progress Notes (Signed)
Daily Session Note  Patient Details  Name: Jodi Ray MRN: [id number] Date of Birth: 10-Dec-1960 Referring Provider:     CARDIAC REHAB PHASE II EXERCISE from 06/17/2018 in [place]  Referring Provider  Bronson Ing      Encounter Date: 08/07/2018  Check In: Session Check In - 08/07/18 0828      Check-In   Supervising physician immediately available to respond to emergencies  See telemetry face sheet for immediately available MD    Location  [facility]    Staff Present  Russella Dar, MS, EP, [facility], Exercise Physiologist;Amanda Zachery Conch, Exercise Physiologist;Debra Wynetta Emery, RN, BSN    Medication changes reported      No    Fall or balance concerns reported     No    Tobacco Cessation  No Change    Warm-up and Cool-down  Performed as group-led instruction    Resistance Training Performed  Yes    VAD Patient?  No    PAD/SET Patient?  No      Pain Assessment   Currently in Pain?  No/denies    Pain Score  0-No pain    Multiple Pain Sites  No       Capillary Blood Glucose: No results found for this or any previous visit (from the past 24 hour(s)).    Social History   Tobacco Use  Smoking Status Never Smoker  Smokeless Tobacco Never Used    Goals Met:  Independence with exercise equipment Exercise tolerated well Personal goals reviewed No report of cardiac concerns or symptoms Strength training completed today  Goals Unmet:  Not Applicable  Comments: Check [id number]   Dr. Kate Sable is Medical Director for [facility] and Pulmonary Rehab.

## 2018-08-08 ENCOUNTER — Telehealth: Payer: Self-pay | Admitting: Neurology

## 2018-08-08 DIAGNOSIS — I10 Essential (primary) hypertension: Secondary | ICD-10-CM | POA: Diagnosis not present

## 2018-08-08 DIAGNOSIS — R42 Dizziness and giddiness: Secondary | ICD-10-CM | POA: Diagnosis not present

## 2018-08-08 DIAGNOSIS — R51 Headache: Secondary | ICD-10-CM | POA: Diagnosis not present

## 2018-08-08 DIAGNOSIS — G43909 Migraine, unspecified, not intractable, without status migrainosus: Secondary | ICD-10-CM | POA: Diagnosis not present

## 2018-08-08 DIAGNOSIS — E1165 Type 2 diabetes mellitus with hyperglycemia: Secondary | ICD-10-CM | POA: Diagnosis not present

## 2018-08-08 DIAGNOSIS — H538 Other visual disturbances: Secondary | ICD-10-CM | POA: Diagnosis not present

## 2018-08-08 DIAGNOSIS — E1122 Type 2 diabetes mellitus with diabetic chronic kidney disease: Secondary | ICD-10-CM | POA: Diagnosis not present

## 2018-08-08 DIAGNOSIS — Z6839 Body mass index (BMI) 39.0-39.9, adult: Secondary | ICD-10-CM | POA: Diagnosis not present

## 2018-08-08 DIAGNOSIS — Z299 Encounter for prophylactic measures, unspecified: Secondary | ICD-10-CM | POA: Diagnosis not present

## 2018-08-08 DIAGNOSIS — G319 Degenerative disease of nervous system, unspecified: Secondary | ICD-10-CM | POA: Diagnosis not present

## 2018-08-08 DIAGNOSIS — Z789 Other specified health status: Secondary | ICD-10-CM | POA: Diagnosis not present

## 2018-08-08 NOTE — Telephone Encounter (Signed)
Patient states that she is having really bad headache for the last 2 weeks and the medication that Posey Pronto called in is not helping her. She states that she has appt with PCP at 11:45 today. She cant drive or really do anything please call her she would like to speak to someone

## 2018-08-08 NOTE — Telephone Encounter (Signed)
Called and spoke with Pt. She said th Zanaflex has not been very effective for her headaches. It does help her go to sleep for a few hour, then awakes still with headache. He normal "icepick" stabbing headaches last a few hours, this has been 2 weeks. She does have an appt with her PCP today and if does not feel like he helps, she will call back.

## 2018-08-08 NOTE — Progress Notes (Signed)
Cardiac Individual Treatment Plan  Patient Details  Name: Jodi Ray MRN: [id number] Date of Birth: 05/29/1961 Referring Provider:     CARDIAC REHAB PHASE II EXERCISE from 06/17/2018 in [place]  Referring Provider  Bronson Ing      Initial Encounter Date:    CARDIAC REHAB PHASE II EXERCISE from 06/17/2018 in [place]  Date  06/17/18      Visit Diagnosis: Status post coronary artery stent placement  Patient's Home Medications on Admission:  Current Outpatient Medications:  .  aspirin EC 81 MG tablet, Take 81 mg by mouth daily., Disp: , Rfl:  .  atorvastatin (LIPITOR) 80 MG tablet, Take 1 tablet (80 mg total) by mouth daily at 6 PM., Disp: 90 tablet, Rfl: 3 .  carvedilol (COREG) 3.125 MG tablet, Take 1 tablet (3.125 mg total) by mouth 2 (two) times daily., Disp: 180 tablet, Rfl: 3 .  Cholecalciferol (VITAMIN D-3) 5000 units TABS, Take 5,000 Units by mouth every 3 (three) days. , Disp: , Rfl:  .  clopidogrel (PLAVIX) 75 MG tablet, Take 1 tablet (75 mg total) by mouth daily with breakfast., Disp: 90 tablet, Rfl: 3 .  dicyclomine (BENTYL) 10 MG capsule, Take 10 mg by mouth 4 (four) times daily -  before meals and at bedtime., Disp: , Rfl:  .  EPINEPHrine 0.3 mg/0.3 mL IJ SOAJ injection, Inject 0.3 mg into the muscle as needed., Disp: , Rfl: 1 .  Fluocinolone Acetonide (DERMOTIC) 0.01 % OIL, Place 1 drop into both ears 3 (three) times a week. , Disp: , Rfl:  .  furosemide (LASIX) 20 MG tablet, Take 1 tablet (20 mg total) by mouth as needed., Disp: 30 tablet, Rfl: 3 .  gabapentin (NEURONTIN) 400 MG capsule, Take 1,200 mg by mouth at bedtime. , Disp: , Rfl:  .  Insulin Human (INSULIN PUMP) SOLN, Inject into the skin continuous. Novolog (up to 60 units as of 02/24/2018), Disp: , Rfl:  .  levocetirizine (XYZAL) 5 MG tablet, Take 5 mg by mouth daily with breakfast., Disp: , Rfl:  .  levothyroxine (SYNTHROID, LEVOTHROID) 125 MCG tablet,  Take 125 mcg by mouth daily before breakfast. , Disp: , Rfl: 0 .  loratadine (CLARITIN) 10 MG tablet, Take 10 mg by mouth 2 (two) times daily. , Disp: , Rfl:  .  metFORMIN (GLUCOPHAGE) 500 MG tablet, Take 1 tablet (500 mg total) by mouth 2 (two) times daily with a meal. Do not take 03/23/2018 and 03/24/2018. Restart taking from 03/25/2018., Disp: , Rfl:  .  Multiple Vitamins-Minerals (CENTRUM SILVER 50+WOMEN) TABS, Take 1 tablet by mouth daily. , Disp: , Rfl:  .  nitroGLYCERIN (NITROSTAT) 0.4 MG SL tablet, Place 1 tablet (0.4 mg total) under the tongue every 5 (five) minutes as needed for chest pain., Disp: 25 tablet, Rfl: 3 .  NOVOLOG 100 UNIT/ML injection, Inject into the skin continuous. Via insulin pump as directed up to 60 units (CGM), Disp: , Rfl: 0 .  Omega-3 Fatty Acids (FISH OIL) 1000 MG CAPS, Take 1,000 mg by mouth daily., Disp: , Rfl:  .  pantoprazole (PROTONIX) 40 MG tablet, Take 1 tablet (40 mg total) by mouth 2 (two) times daily before a meal., Disp: 60 tablet, Rfl: 0 .  Polyethylene Glycol 400 (BLINK TEARS) 0.25 % SOLN, Apply 1 drop to eye daily as needed (for eye irritation/dryness)., Disp: , Rfl:  .  tizanidine (ZANAFLEX) 2 MG capsule, Take 1 capsule (2 mg total) by mouth at  bedtime., Disp: 30 capsule, Rfl: 1 .  topiramate (TOPAMAX) 50 MG tablet, TAKE 1 TABLET BY MOUTH ONCE DAILY (Patient taking differently: Take 50 mg by mouth every evening. ), Disp: 90 tablet, Rfl: 0 .  topiramate (TOPAMAX) 50 MG tablet, Take 1 tablet (50 mg total) by mouth daily., Disp: 30 tablet, Rfl: 1  Past Medical History: Past Medical History:  Diagnosis Date  . 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 Anemia   . Brain aneurysm    frontal lobe  . CAD (coronary artery disease)    a. 02/2018: s/p DES to Proximal LAD with residual 20% RCA stenosis.  . Chronic pain   . Diabetes mellitus without complication ([place])   . Headache   . History of left bundle branch block (LBBB)   . HTN (hypertension)   . Hx of cardiovascular stress  test 10/2016   intermediate risk study  . Hypothyroidism   . Neuromuscular disorder ([place])   . Neuropathy   . Obesity   . OSA on CPAP 03/10/2015  . Retinopathy   . Sleep apnea   . Varicose veins of both lower extremities     Tobacco Use: Social History   Tobacco Use  Smoking Status Never Smoker  Smokeless Tobacco Never Used    Labs: Recent Review Flowsheet Data    Labs for ITP Cardiac and Pulmonary Rehab Latest Ref Rng & Units 01/15/2015 07/23/2015 03/20/2018 04/16/2018   Cholestrol 0 - 200 mg/dL - - - 134   LDLCALC 0 - 99 mg/dL - - - 54   HDL >40 mg/dL - - - 75   Trlycerides <150 mg/dL - - - 23   Hemoglobin A1c 4.8 - 5.6 % 9.6(A) 9.1(H) 7.8(H) -      Capillary Blood Glucose: Lab Results  Component Value Date   GLUCAP 196 (H) 04/17/2018   GLUCAP 128 (H) 04/16/2018   GLUCAP 108 (H) 04/16/2018   GLUCAP 195 (H) 04/16/2018   GLUCAP 107 (H) 04/16/2018     Exercise Target Goals: Exercise Program Goal: Individual exercise prescription set using results from initial 6 min walk test and THRR while considering  patient's activity barriers and safety.   Exercise Prescription Goal: Starting with aerobic activity 30 plus minutes a day, 3 days per week for initial exercise prescription. Provide home exercise prescription and guidelines that participant acknowledges understanding prior to discharge.  Activity Barriers & Risk Stratification: Activity Barriers & Cardiac Risk Stratification - 06/17/18 1413      Activity Barriers & Cardiac Risk Stratification   Activity Barriers  Balance Concerns;Muscular Weakness;Other (comment)    Comments  bilateral foot problems     Cardiac Risk Stratification  High       6 Minute Walk: 6 Minute Walk    Row Name 06/17/18 1411         6 Minute Walk   Phase  Initial     Distance  900 feet     Walk Time  6 minutes     # of Rest Breaks  0     MPH  1.7     METS  2.3     RPE  7     Perceived Dyspnea   9     VO2 Peak  9.54     Symptoms   No     Resting HR  94 bpm     Resting BP  116/60     Resting Oxygen Saturation   94 %     Exercise Oxygen  Saturation  during 6 min walk  98 %     Max Ex. HR  134 bpm     Max Ex. BP  162/68     2 Minute Post BP  120/60        Oxygen Initial Assessment:   Oxygen Re-Evaluation:   Oxygen Discharge (Final Oxygen Re-Evaluation):   Initial Exercise Prescription: Initial Exercise Prescription - 06/17/18 1400      Date of Initial Exercise RX and Referring Provider   Date  06/17/18    Referring Provider  Koneswaran    Expected Discharge Date  09/17/18      NuStep   Level  1    SPM  102    Minutes  17    METs  2.2      Arm Ergometer   Level  1    Watts  17    RPM  60    Minutes  17    METs  2.1      Prescription Details   Frequency (times per week)  3    Duration  Progress to 30 minutes of continuous aerobic without signs/symptoms of physical distress      Intensity   THRR 40-80% of Max Heartrate  [phone]    Ratings of Perceived Exertion  11-13    Perceived Dyspnea  0-4      Progression   Progression  Continue to progress workloads to maintain intensity without signs/symptoms of physical distress.      Resistance Training   Training Prescription  Yes    Weight  1    Reps  10-15       Perform Capillary Blood Glucose checks as needed.  Exercise Prescription Changes:  Exercise Prescription Changes    Row Name 07/03/18 1400 07/16/18 1500 08/01/18 0800         Response to Exercise   Blood Pressure (Admit)  152/64  122/68  126/64     Blood Pressure (Exercise)  184/70  172/70  146/80     Blood Pressure (Exit)  130/58  116/62  118/62     Heart Rate (Admit)  81 bpm  72 bpm  106 bpm     Heart Rate (Exercise)  113 bpm  99 bpm  97 bpm     Heart Rate (Exit)  96 bpm  78 bpm  102 bpm     Rating of Perceived Exertion (Exercise)  '13  11  11     ' Comments  First two weeks of exercise!   -  -     Duration  Progress to 30 minutes of  aerobic without signs/symptoms  of physical distress  Progress to 30 minutes of  aerobic without signs/symptoms of physical distress  Continue with 30 min of aerobic exercise without signs/symptoms of physical distress.     Intensity  THRR New 120-134-147  THRR unchanged  THRR unchanged       Progression   Progression  Continue to progress workloads to maintain intensity without signs/symptoms of physical distress.  Continue to progress workloads to maintain intensity without signs/symptoms of physical distress.  Continue to progress workloads to maintain intensity without signs/symptoms of physical distress.     Average METs  2.7  2.9  3.3       Resistance Training   Training Prescription  Yes  Yes  Yes     Weight  '1  2  3     ' Reps  10-15  10-15  10-15  NuStep   Level  '1  2  2     ' SPM  93  98  117     Minutes  '22  22  22     ' METs  2.2  2.3  2.3       Arm Ergometer   Level  1.'8  2  2     ' Watts  30  37  80     RPM  55  79  27     Minutes  '17  17  17     ' METs  3.2  3.8  4.3       Home Exercise Plan   Plans to continue exercise at  -  -  Home (comment) walking and strethcing     Frequency  -  -  Add 2 additional days to program exercise sessions.     Initial Home Exercises Provided  -  -  06/17/18        Exercise Comments:  Exercise Comments    Row Name 07/16/18 1532 08/06/18 1525         Exercise Comments  Pt. has done great in rehab so far during her first 9 sessions. She has tolerated the exercise well and pushes herself to increase workload every day. We will continue to progress her as tolerated.   Pt. has continued to work hard and progress through the program. She is up to level 3 on the NuStep as well as the Arm Ergometer. We will continue to increase her workloads as best fit.          Exercise Goals and Review:  Exercise Goals    Row Name 06/17/18 1415             Exercise Goals   Increase Physical Activity  Yes       Intervention  Provide advice, education, support and counseling  about physical activity/exercise needs.;Develop an individualized exercise prescription for aerobic and resistive training based on initial evaluation findings, risk stratification, comorbidities and participant's personal goals.       Expected Outcomes  Short Term: Attend rehab on a regular basis to increase amount of physical activity.       Increase Strength and Stamina  Yes       Intervention  Provide advice, education, support and counseling about physical activity/exercise needs.;Develop an individualized exercise prescription for aerobic and resistive training based on initial evaluation findings, risk stratification, comorbidities and participant's personal goals.       Expected Outcomes  Short Term: Increase workloads from initial exercise prescription for resistance, speed, and METs.       Able to understand and use rate of perceived exertion (RPE) scale  Yes       Intervention  Provide education and explanation on how to use RPE scale       Expected Outcomes  Short Term: Able to use RPE daily in rehab to express subjective intensity level;Long Term:  Able to use RPE to guide intensity level when exercising independently       Able to understand and use Dyspnea scale  Yes       Intervention  Provide education and explanation on how to use Dyspnea scale       Expected Outcomes  Short Term: Able to use Dyspnea scale daily in rehab to express subjective sense of shortness of breath during exertion;Long Term: Able to use Dyspnea scale to guide intensity level when exercising independently  Knowledge and understanding of Target Heart Rate Range (THRR)  Yes       Intervention  Provide education and explanation of THRR including how the numbers were predicted and where they are located for reference       Expected Outcomes  Long Term: Able to use THRR to govern intensity when exercising independently;Short Term: Able to state/look up THRR;Short Term: Able to use daily as guideline for intensity  in rehab       Able to check pulse independently  Yes       Intervention  Provide education and demonstration on how to check pulse in carotid and radial arteries.;Review the importance of being able to check your own pulse for safety during independent exercise       Expected Outcomes  Short Term: Able to explain why pulse checking is important during independent exercise;Long Term: Able to check pulse independently and accurately       Understanding of Exercise Prescription  Yes       Intervention  Provide education, explanation, and written materials on patient's individual exercise prescription       Expected Outcomes  Short Term: Able to explain program exercise prescription;Long Term: Able to explain home exercise prescription to exercise independently          Exercise Goals Re-Evaluation : Exercise Goals Re-Evaluation    Row Name 07/16/18 1531 08/06/18 1523           Exercise Goal Re-Evaluation   Exercise Goals Review  Increase Physical Activity;Increase Strength and Stamina;Able to understand and use rate of perceived exertion (RPE) scale;Knowledge and understanding of Target Heart Rate Range (THRR);Able to check pulse independently;Understanding of Exercise Prescription;Improve claudication pain tolerance and improve walking ability  Increase Physical Activity;Increase Strength and Stamina;Able to understand and use rate of perceived exertion (RPE) scale;Knowledge and understanding of Target Heart Rate Range (THRR);Able to check pulse independently;Understanding of Exercise Prescription;Improve claudication pain tolerance and improve walking ability      Comments  Pt. has done well in the program so far, she has attended 9 sessions. She has tolerated the exercise and progressions well and is eager to come in, work hard and push herself.   Pt. continues to do well in CR. She always comes to class ready to work and pushes herself on both pieces of equipment in order to increase her  distance and overall MET level.       Expected Outcomes  Improve walking, walk without pain, lose weight.   Improve walking, walk without pain, lose weight.           Discharge Exercise Prescription (Final Exercise Prescription Changes): Exercise Prescription Changes - 08/01/18 0800      Response to Exercise   Blood Pressure (Admit)  126/64    Blood Pressure (Exercise)  146/80    Blood Pressure (Exit)  118/62    Heart Rate (Admit)  106 bpm    Heart Rate (Exercise)  97 bpm    Heart Rate (Exit)  102 bpm    Rating of Perceived Exertion (Exercise)  11    Duration  Continue with 30 min of aerobic exercise without signs/symptoms of physical distress.    Intensity  THRR unchanged      Progression   Progression  Continue to progress workloads to maintain intensity without signs/symptoms of physical distress.    Average METs  3.3      Resistance Training   Training Prescription  Yes    Weight  3  Reps  10-15      NuStep   Level  2    SPM  117    Minutes  22    METs  2.3      Arm Ergometer   Level  2    Watts  80    RPM  27    Minutes  17    METs  4.3      Home Exercise Plan   Plans to continue exercise at  Home (comment)   walking and strethcing   Frequency  Add 2 additional days to program exercise sessions.    Initial Home Exercises Provided  06/17/18       Nutrition:  Target Goals: Understanding of nutrition guidelines, daily intake of sodium <1550m, cholesterol <2078m calories 30% from fat and 7% or less from saturated fats, daily to have 5 or more servings of fruits and vegetables.  Biometrics: Pre Biometrics - 06/17/18 1416      Pre Biometrics   Height  '5\' 8"'  (1.727 m)    Waist Circumference  44 inches    Hip Circumference  54 inches    Waist to Hip Ratio  0.81 %    Triceps Skinfold  23 mm    % Body Fat  46 %    Grip Strength  17.5 kg    Flexibility  0 in    Single Leg Stand  1.5 seconds        Nutrition Therapy Plan and Nutrition  Goals: Nutrition Therapy & Goals - 08/08/18 1012      Nutrition Therapy   RD appointment deferred  Yes      Personal Nutrition Goals   Comments  Patient did not attend RD appointment in January. She says he is following a diabetic diet. Will continue to montior for progress.       Intervention Plan   Intervention  Nutrition handout(s) given to patient.       Nutrition Assessments: Nutrition Assessments - 06/17/18 1529      MEDFICTS Scores   Pre Score  6       Nutrition Goals Re-Evaluation:   Nutrition Goals Discharge (Final Nutrition Goals Re-Evaluation):   Psychosocial: Target Goals: Acknowledge presence or absence of significant depression and/or stress, maximize coping skills, provide positive support system. Participant is able to verbalize types and ability to use techniques and skills needed for reducing stress and depression.  Initial Review & Psychosocial Screening: Initial Psych Review & Screening - 06/17/18 1521      Initial Review   Current issues with  None Identified      Family Dynamics   Good Support System?  Yes      Barriers   Psychosocial barriers to participate in program  There are no identifiable barriers or psychosocial needs.      Screening Interventions   Interventions  Encouraged to exercise    Expected Outcomes  Short Term goal: Identification and review with participant of any Quality of Life or Depression concerns found by scoring the questionnaire.;Long Term goal: The participant improves quality of Life and PHQ9 Scores as seen by post scores and/or verbalization of changes       Quality of Life Scores: Quality of Life - 06/17/18 1420      Quality of Life   Select  Quality of Life      Quality of Life Scores   Health/Function Pre  24.41 %    Socioeconomic Pre  20.36 %    Psych/Spiritual  Pre  26.43 %    Family Pre  30 %    GLOBAL Pre  24.08 %      Scores of 19 and below usually indicate a poorer quality of life in these areas.   A difference of  2-3 points is a clinically meaningful difference.  A difference of 2-3 points in the total score of the Quality of Life Index has been associated with significant improvement in overall quality of life, self-image, physical symptoms, and general health in studies assessing change in quality of life.  PHQ-9: Recent Review Flowsheet Data    Depression screen [facility] 2/9 06/17/2018 04/09/2017 08/04/2015 04/30/2015 05/18/2014   Decreased Interest 0 0 0 0 0   Down, Depressed, Hopeless 0 0 0 0 0   PHQ - 2 Score 0 0 0 0 0   Altered sleeping 0 - - - -   Tired, decreased energy 0 - - - -   Change in appetite 0 - - - -   Feeling bad or failure about yourself  0 - - - -   Trouble concentrating 0 - - - -   Moving slowly or fidgety/restless 0 - - - -   Suicidal thoughts 0 - - - -   PHQ-9 Score 0 - - - -   Difficult doing work/chores Not difficult at all - - - -     Interpretation of Total Score  Total Score Depression Severity:  1-4 = Minimal depression, 5-9 = Mild depression, 10-14 = Moderate depression, 15-19 = Moderately severe depression, 20-27 = Severe depression   Psychosocial Evaluation and Intervention: Psychosocial Evaluation - 06/17/18 1523      Psychosocial Evaluation & Interventions   Interventions  Encouraged to exercise with the program and follow exercise prescription    Continue Psychosocial Services   No Follow up required       Psychosocial Re-Evaluation: Psychosocial Re-Evaluation    Row Name 07/18/18 0832 08/08/18 1016           Psychosocial Re-Evaluation   Current issues with  None Identified  None Identified      Comments  Patient's initial QOL score was 24.08 and her PHQ-9 score was 0 with no psychosocial issues identified.   Patient's initial QOL score was 24.08 and her PHQ-9 score was 0 with no psychosocial issues identified.       Expected Outcomes  Patient will have no psychosocial issues identified at discharge.   Patient will have no psychosocial  issues identified at discharge.       Interventions  Stress management education;Encouraged to attend Cardiac Rehabilitation for the exercise;Relaxation education  Stress management education;Encouraged to attend Cardiac Rehabilitation for the exercise;Relaxation education      Continue Psychosocial Services   No Follow up required  No Follow up required         Psychosocial Discharge (Final Psychosocial Re-Evaluation): Psychosocial Re-Evaluation - 08/08/18 1016      Psychosocial Re-Evaluation   Current issues with  None Identified    Comments  Patient's initial QOL score was 24.08 and her PHQ-9 score was 0 with no psychosocial issues identified.     Expected Outcomes  Patient will have no psychosocial issues identified at discharge.     Interventions  Stress management education;Encouraged to attend Cardiac Rehabilitation for the exercise;Relaxation education    Continue Psychosocial Services   No Follow up required       Vocational Rehabilitation: Provide vocational rehab assistance to qualifying candidates.   Vocational  Rehab Evaluation & Intervention: Vocational Rehab - 06/17/18 1530      Initial Vocational Rehab Evaluation & Intervention   Assessment shows need for Vocational Rehabilitation  No       Education: Education Goals: Education classes will be provided on a weekly basis, covering required topics. Participant will state understanding/return demonstration of topics presented.  Learning Barriers/Preferences: Learning Barriers/Preferences - 06/17/18 1529      Learning Barriers/Preferences   Learning Barriers  None    Learning Preferences  Audio;Computer/Internet;Group Instruction;Individual Instruction;Pictoral;Skilled Demonstration;Verbal Instruction;Video;Written Material       Education Topics: Hypertension, Hypertension Reduction -Define heart disease and high blood pressure. Discus how high blood pressure affects the body and ways to reduce high blood  pressure.   Exercise and Your Heart -Discuss why it is important to exercise, the FITT principles of exercise, normal and abnormal responses to exercise, and how to exercise safely.   Angina -Discuss definition of angina, causes of angina, treatment of angina, and how to decrease risk of having angina.   Cardiac Medications -Review what the following cardiac medications are used for, how they affect the body, and side effects that may occur when taking the medications.  Medications include Aspirin, Beta blockers, calcium channel blockers, ACE Inhibitors, angiotensin receptor blockers, diuretics, digoxin, and antihyperlipidemics.   CARDIAC REHAB PHASE II EXERCISE from 08/07/2018 in [place]  Date  06/26/18  Educator  Etheleen Mayhew  Instruction Review Code  2- Demonstrated Understanding      Congestive Heart Failure -Discuss the definition of CHF, how to live with CHF, the signs and symptoms of CHF, and how keep track of weight and sodium intake.   CARDIAC REHAB PHASE II EXERCISE from 08/07/2018 in [place]  Date  07/03/18  Educator  Wynetta Emery  Instruction Review Code  2- Demonstrated Understanding      Heart Disease and Intimacy -Discus the effect sexual activity has on the heart, how changes occur during intimacy as we age, and safety during sexual activity.   CARDIAC REHAB PHASE II EXERCISE from 08/07/2018 in [place]  Date  07/10/18  Educator  Etheleen Mayhew  Instruction Review Code  2- Demonstrated Understanding      Smoking Cessation / COPD -Discuss different methods to quit smoking, the health benefits of quitting smoking, and the definition of COPD.   CARDIAC REHAB PHASE II EXERCISE from 08/07/2018 in [place]  Date  07/17/18  Educator  Etheleen Mayhew   Instruction Review Code  2- Demonstrated Understanding      Nutrition I: Fats -Discuss the types of cholesterol, what cholesterol does  to the heart, and how cholesterol levels can be controlled.   Nutrition II: Labels -Discuss the different components of food labels and how to read food label   CARDIAC REHAB PHASE II EXERCISE from 08/07/2018 in [place]  Date  08/07/18  Educator  DC  Instruction Review Code  2- Demonstrated Understanding      Heart Parts/Heart Disease and PAD -Discuss the anatomy of the heart, the pathway of blood circulation through the heart, and these are affected by heart disease.   CARDIAC REHAB PHASE II EXERCISE from 08/07/2018 in [place]  Date  08/02/18  Educator  Wynetta Emery  Instruction Review Code  2- Demonstrated Understanding      Stress I: Signs and Symptoms -Discuss the causes of stress, how stress may lead to anxiety and depression, and ways to limit stress.  Stress II: Relaxation -Discuss different types of relaxation techniques to limit stress.   Warning Signs of Stroke / TIA -Discuss definition of a stroke, what the signs and symptoms are of a stroke, and how to identify when someone is having stroke.   Knowledge Questionnaire Score: Knowledge Questionnaire Score - 06/17/18 1530      Knowledge Questionnaire Score   Pre Score  23/24       Core Components/Risk Factors/Patient Goals at Admission: Personal Goals and Risk Factors at Admission - 06/17/18 1530      Core Components/Risk Factors/Patient Goals on Admission    Weight Management  Yes    Intervention  Weight Management/Obesity: Establish reasonable short term and long term weight goals.    Admit Weight  261 lb (118.4 kg)    Goal Weight: Short Term  246 lb (111.6 kg)    Goal Weight: Long Term  231 lb (104.8 kg)    Expected Outcomes  Short Term: Continue to assess and modify interventions until short term weight is achieved;Long Term: Adherence to nutrition and physical activity/exercise program aimed toward attainment of established weight goal    Personal Goal Other   Yes    Personal Goal  Be able to walk further and lose 30lbs    Intervention  Attend program 3 x week and supplement with 2 days week at home stretching and hand heald weights.    Expected Outcomes  Reach personal goals.        Core Components/Risk Factors/Patient Goals Review:  Goals and Risk Factor Review    Row Name 07/18/18 0827 08/08/18 1013           Core Components/Risk Factors/Patient Goals Review   Personal Goals Review  Weight Management/Obesity;Diabetes Walk further; lose 30 lbs.   Weight Management/Obesity;Diabetes Walk further; lose 30 lbs.       Review  Patient has completed 10 sessions maintaining her weight since her initial visit. She is doing well in the program with progression. She was seen in the ED 07/16/18 with a near syncope episode transported by EMS. She was hypotensive and later evaluated by her cardiologist. He adjusted her medication. Her b/p have been stable. Her last A1C on file was 7.8 on 03/20/18. Her fasting reported glucose readings are usually below 120. Will continue to monitor for progress.   Patient has completed 16 sessions losing 10 lbs since last 30 day review. She continues to do well in the program with progression. Her last report A1C was 05/2018 at 7.7 mg/dl. Her reported fasting glucose readings average 115 mg/dl. She is on an insulin pump. She says she has a lot more energy and is able to walk further and is doing more around the house. Will continue to monitor for progress.       Expected Outcomes  Patient will continue to attend sessions and complete the program meeting her personal goals.   Patient will continue to attend sessions and complete the program meeting her personal goals.          Core Components/Risk Factors/Patient Goals at Discharge (Final Review):  Goals and Risk Factor Review - 08/08/18 1013      Core Components/Risk Factors/Patient Goals Review   Personal Goals Review  Weight Management/Obesity;Diabetes   Walk further; lose  30 lbs.    Review  Patient has completed 16 sessions losing 10 lbs since last 30 day review. She continues to do well in the program with progression. Her last report A1C was 05/2018 at 7.7  mg/dl. Her reported fasting glucose readings average 115 mg/dl. She is on an insulin pump. She says she has a lot more energy and is able to walk further and is doing more around the house. Will continue to monitor for progress.     Expected Outcomes  Patient will continue to attend sessions and complete the program meeting her personal goals.        ITP Comments: ITP Comments    Row Name 07/01/18 1012           ITP Comments  Patient new to program. She has completed 4 sessions. Will continue to monitor for progress.           Comments: ITP REVIEW Patient is doing well in the program. Will continue to monitor for progress.

## 2018-08-09 ENCOUNTER — Telehealth: Payer: Self-pay | Admitting: Neurology

## 2018-08-09 ENCOUNTER — Encounter (HOSPITAL_COMMUNITY)
Admission: RE | Admit: 2018-08-09 | Discharge: 2018-08-09 | Disposition: A | Discharge status: Home / Self Care | Payer: Medicare Other | Source: Ambulatory Visit | Attending: Cardiovascular Disease | Admitting: Cardiovascular Disease

## 2018-08-09 DIAGNOSIS — Z955 Presence of coronary angioplasty implant and graft: Secondary | ICD-10-CM | POA: Diagnosis not present

## 2018-08-09 DIAGNOSIS — R51 Headache: Secondary | ICD-10-CM | POA: Diagnosis not present

## 2018-08-09 DIAGNOSIS — Z79899 Other long term (current) drug therapy: Secondary | ICD-10-CM | POA: Diagnosis not present

## 2018-08-09 DIAGNOSIS — Z7902 Long term (current) use of antithrombotics/antiplatelets: Secondary | ICD-10-CM | POA: Diagnosis not present

## 2018-08-09 DIAGNOSIS — N183 Chronic kidney disease, stage 3 (moderate): Secondary | ICD-10-CM | POA: Diagnosis not present

## 2018-08-09 DIAGNOSIS — Z7982 Long term (current) use of aspirin: Secondary | ICD-10-CM | POA: Diagnosis not present

## 2018-08-09 DIAGNOSIS — E1022 Type 1 diabetes mellitus with diabetic chronic kidney disease: Secondary | ICD-10-CM | POA: Diagnosis not present

## 2018-08-09 DIAGNOSIS — Z794 Long term (current) use of insulin: Secondary | ICD-10-CM | POA: Diagnosis not present

## 2018-08-09 NOTE — Telephone Encounter (Signed)
Patient states that she is returning your call to let  you know what the PCP said at her visit

## 2018-08-09 NOTE — Telephone Encounter (Signed)
Pt called to let us know her PCP sent her for CT, which was negative, gave her headache cocktail, her headache resolved. PCP also started her on Trokendi 100mg . Pt wanted to update [place] and make sure she was still on wait list.

## 2018-08-09 NOTE — Progress Notes (Signed)
Daily Session Note  Patient Details  Name: Jodi Ray MRN: [id number] Date of Birth: 11-25-1960 Referring Provider:     CARDIAC REHAB PHASE II EXERCISE from 06/17/2018 in [place]  Referring Provider  Bronson Ing      Encounter Date: 08/09/2018  Check In: Session Check In - 08/09/18 0815      Check-In   Supervising physician immediately available to respond to emergencies  See telemetry face sheet for immediately available MD    Location  [facility]    Staff Present  Russella Dar, MS, EP, [facility], Exercise Physiologist;Analee Montee Zachery Conch, Exercise Physiologist;Debra Wynetta Emery, RN, BSN    Medication changes reported      No    Fall or balance concerns reported     No    Tobacco Cessation  No Change    Warm-up and Cool-down  Performed as group-led instruction    Resistance Training Performed  Yes    VAD Patient?  No    PAD/SET Patient?  No      Pain Assessment   Currently in Pain?  No/denies    Pain Score  0-No pain    Multiple Pain Sites  No       Capillary Blood Glucose: No results found for this or any previous visit (from the past 24 hour(s)).    Social History   Tobacco Use  Smoking Status Never Smoker  Smokeless Tobacco Never Used    Goals Met:  Independence with exercise equipment Exercise tolerated well No report of cardiac concerns or symptoms Strength training completed today  Goals Unmet:  Not Applicable  Comments: Pt able to follow exercise prescription today without complaint.  Will continue to monitor for progression. Check out 0915.   Dr. Kate Sable is Medical Director for [facility].

## 2018-08-11 ENCOUNTER — Emergency Department (HOSPITAL_COMMUNITY)
Admission: EM | Admit: 2018-08-11 | Discharge: 2018-08-11 | Disposition: A | Discharge status: Home / Self Care | Payer: Medicare Other | Attending: Emergency Medicine | Admitting: Emergency Medicine

## 2018-08-11 ENCOUNTER — Encounter (HOSPITAL_COMMUNITY): Payer: Self-pay | Admitting: *Deleted

## 2018-08-11 ENCOUNTER — Emergency Department (HOSPITAL_COMMUNITY): Payer: Medicare Other

## 2018-08-11 ENCOUNTER — Other Ambulatory Visit: Payer: Self-pay

## 2018-08-11 DIAGNOSIS — I2510 Atherosclerotic heart disease of native coronary artery without angina pectoris: Secondary | ICD-10-CM | POA: Insufficient documentation

## 2018-08-11 DIAGNOSIS — Z7982 Long term (current) use of aspirin: Secondary | ICD-10-CM | POA: Diagnosis not present

## 2018-08-11 DIAGNOSIS — Z79899 Other long term (current) drug therapy: Secondary | ICD-10-CM | POA: Diagnosis not present

## 2018-08-11 DIAGNOSIS — E039 Hypothyroidism, unspecified: Secondary | ICD-10-CM | POA: Diagnosis not present

## 2018-08-11 DIAGNOSIS — I10 Essential (primary) hypertension: Secondary | ICD-10-CM | POA: Insufficient documentation

## 2018-08-11 DIAGNOSIS — Z794 Long term (current) use of insulin: Secondary | ICD-10-CM | POA: Diagnosis not present

## 2018-08-11 DIAGNOSIS — Z7902 Long term (current) use of antithrombotics/antiplatelets: Secondary | ICD-10-CM | POA: Insufficient documentation

## 2018-08-11 DIAGNOSIS — E109 Type 1 diabetes mellitus without complications: Secondary | ICD-10-CM | POA: Insufficient documentation

## 2018-08-11 DIAGNOSIS — R51 Headache: Secondary | ICD-10-CM | POA: Diagnosis not present

## 2018-08-11 DIAGNOSIS — R519 Headache, unspecified: Secondary | ICD-10-CM

## 2018-08-11 DIAGNOSIS — I6523 Occlusion and stenosis of bilateral carotid arteries: Secondary | ICD-10-CM | POA: Diagnosis not present

## 2018-08-11 LAB — BASIC METABOLIC PANEL
Anion gap: 8 (ref 5–15)
BUN: 19 mg/dL (ref 6–20)
CHLORIDE: 106 mmol/L (ref 98–111)
CO2: 22 mmol/L (ref 22–32)
Calcium: 9.6 mg/dL (ref 8.9–10.3)
Creatinine, Ser: 1.02 mg/dL — ABNORMAL HIGH (ref 0.44–1.00)
GFR calc Af Amer: >60 mL/min (ref >60)
GFR calc non Af Amer: >60 mL/min (ref >60)
Glucose, Bld: 96 mg/dL (ref 70–99)
POTASSIUM: 4.8 mmol/L (ref 3.5–5.1)
Sodium: 136 mmol/L (ref 135–145)

## 2018-08-11 LAB — CBC WITH DIFFERENTIAL/PLATELET
Abs Immature Granulocytes: 0.01 10*3/uL (ref 0.00–0.07)
Basophils Absolute: 0 10*3/uL (ref 0.0–0.1)
Basophils Relative: 1 %
Eosinophils Absolute: 0.2 10*3/uL (ref 0.0–0.5)
Eosinophils Relative: 4 %
HEMATOCRIT: 40.7 % (ref 36.0–46.0)
Hemoglobin: 12.4 g/dL (ref 12.0–15.0)
IMMATURE GRANULOCYTES: 0 %
Lymphocytes Relative: 23 %
Lymphs Abs: 1 10*3/uL (ref 0.7–4.0)
MCH: 28.7 pg (ref 26.0–34.0)
MCHC: 30.5 g/dL (ref 30.0–36.0)
MCV: 94.2 fL (ref 80.0–100.0)
MONOS PCT: 8 %
Monocytes Absolute: 0.4 10*3/uL (ref 0.1–1.0)
NEUTROS PCT: 64 %
Neutro Abs: 2.7 10*3/uL (ref 1.7–7.7)
Platelets: 208 10*3/uL (ref 150–400)
RBC: 4.32 MIL/uL (ref 3.87–5.11)
RDW: 14.2 % (ref 11.5–15.5)
WBC: 4.2 10*3/uL (ref 4.0–10.5)
nRBC: 0 % (ref 0.0–0.2)

## 2018-08-11 MED ORDER — DIPHENHYDRAMINE HCL 25 MG PO CAPS
25.0000 mg | ORAL_CAPSULE | Freq: Once | ORAL | Status: AC
  Administered 2018-08-11: 25 mg via ORAL
  Filled 2018-08-11: qty 1

## 2018-08-11 MED ORDER — PROCHLORPERAZINE EDISYLATE 10 MG/2ML IJ SOLN
10.0000 mg | Freq: Once | INTRAMUSCULAR | Status: AC
  Administered 2018-08-11: 10 mg via INTRAVENOUS
  Filled 2018-08-11: qty 2

## 2018-08-11 MED ORDER — DEXAMETHASONE SODIUM PHOSPHATE 10 MG/ML IJ SOLN
10.0000 mg | Freq: Once | INTRAMUSCULAR | Status: AC
  Administered 2018-08-11: 10 mg via INTRAVENOUS
  Filled 2018-08-11: qty 1

## 2018-08-11 MED ORDER — IOPAMIDOL (ISOVUE-370) INJECTION 76%
INTRAVENOUS | Status: AC
  Filled 2018-08-11: qty 100

## 2018-08-11 MED ORDER — LACTATED RINGERS IV BOLUS
1000.0000 mL | Freq: Once | INTRAVENOUS | Status: AC
  Administered 2018-08-11: 1000 mL via INTRAVENOUS

## 2018-08-11 MED ORDER — IOPAMIDOL (ISOVUE-370) INJECTION 76%
75.0000 mL | Freq: Once | INTRAVENOUS | Status: AC | PRN
  Administered 2018-08-11: 75 mL via INTRAVENOUS

## 2018-08-11 NOTE — ED Notes (Signed)
Pt ambulated to restroom located beside of room tolerated well with a cane.

## 2018-08-11 NOTE — ED Triage Notes (Signed)
PT reports a HSA for 2 weeks . Pain is like an ice pick stabbing in her head. Pt reports her MD is out of town and can not get to office. Pt went to her PCP and [facility].

## 2018-08-11 NOTE — ED Provider Notes (Signed)
[facility] Provider Note   CSN: [id number] Arrival date & time: 08/11/18  1204     History   Chief Complaint Chief Complaint  Patient presents with  . Headache    HPI Jodi Ray is a 58 y.o. female.  The history is provided by the patient.  Headache  Pain location:  Frontal Quality:  Sharp Radiates to:  Does not radiate Severity currently:  7/10 Severity at highest:  10/10 Onset quality:  Gradual Duration:  2 weeks Timing:  Intermittent Progression:  Waxing and waning Chronicity:  Recurrent Similar to prior headaches: yes   Context comment:  Hx of brain aneurysm and chronic headaches, recently started on topamax several days ago, unable to get into her neurologist. Patient denies at trauma, weakness, numbness. Intermittent sharp right sided headaches as in the past but worse over last 14 days. Relieved by:  Acetaminophen and resting in a darkened room Worsened by:  Nothing Associated symptoms: no abdominal pain, no back pain, no blurred vision, no congestion, no cough, no dizziness, no ear pain, no eye pain, no facial pain, no fever, no numbness, no paresthesias, no seizures, no sore throat, no URI, no visual change, no vomiting and no weakness     Past Medical History:  Diagnosis Date  . Adult RDS ([place])   . Anemia   . Brain aneurysm    frontal lobe  . CAD (coronary artery disease)    a. 02/2018: s/p DES to Proximal LAD with residual 20% RCA stenosis.  . Chronic pain   . Diabetes mellitus without complication ([place])   . Headache   . History of left bundle branch block (LBBB)   . HTN (hypertension)   . Hx of cardiovascular stress test 10/2016   intermediate risk study  . Hypothyroidism   . Neuromuscular disorder ([place])   . Neuropathy   . Obesity   . OSA on CPAP 03/10/2015  . Retinopathy   . Sleep apnea   . Varicose veins of both lower extremities     Patient Active Problem List   Diagnosis Date Noted  . CAD  (coronary artery disease) 04/16/2018  . Unstable angina ([place]) 03/21/2018  . Chest pain 03/20/2018  . Gastritis and gastroduodenitis 03/05/2017  . Suprapubic pain 03/05/2017  . Right sided abdominal pain 10/24/2016  . Diarrhea 10/24/2016  . Family hx of colon cancer 10/24/2016  . Varicose veins of bilateral lower extremities with other complications 99/83/3825  . 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 Type I diabetes mellitus, uncontrolled ([place]) 04/30/2015  . Mixed hyperlipidemia 04/30/2015  . Primary hypothyroidism 04/30/2015  . Benign hypertension 04/30/2015  . OSA on CPAP 03/10/2015  . Migraine without aura and without status migrainosus, not intractable 01/08/2015  . Primary stabbing headache 10/02/2014    Past Surgical History:  Procedure Laterality Date  . BIOPSY  11/14/2016   Procedure: BIOPSY;  Surgeon: Danie Binder, MD;  Location: [facility];  Service: Endoscopy;;  colon gastric duodenal  . BREAST SURGERY Left 1994   Lumpectomy  . COLONOSCOPY WITH PROPOFOL N/A 11/14/2016   Dr. Oneida Alar: Moderately redundant rectosigmoid colon. Random colon biopsies benign. Internal hemorrhoids. Surveillance colonoscopy in 5 years.  . CORONARY STENT INTERVENTION N/A 03/22/2018   Procedure: CORONARY STENT INTERVENTION;  Surgeon: Burnell Blanks, MD;  Location: [facility];  Service: Cardiovascular;  Laterality: N/A;  . ESOPHAGOGASTRODUODENOSCOPY (EGD) WITH PROPOFOL N/A 11/14/2016   Dr. Oneida Alar: Esophagus normal. Moderate gastritis, few gastric polyps.  Duodenal biopsies negative. Fundic gland polyp gastric polyp area no H pylori.  [facility] Kitchen FOOT SURGERY    . LEFT HEART CATH AND CORONARY ANGIOGRAPHY N/A 03/22/2018   Procedure: LEFT HEART CATH AND CORONARY ANGIOGRAPHY;  Surgeon: Burnell Blanks, MD;  Location: [facility];  Service: Cardiovascular;  Laterality: N/A;  . LEG SURGERY Right   . REPLACEMENT TOTAL KNEE Right   . SPINAL CORD STIMULATOR IMPLANT       OB History     Gravida      Para      Term      Preterm      AB      Living  0     SAB      TAB      Ectopic      Multiple      Live Births               Home Medications    Prior to Admission medications   Medication Sig Start Date End Date Taking? Authorizing Provider  aspirin EC 81 MG tablet Take 81 mg by mouth daily.   Yes [provider]  atorvastatin (LIPITOR) 80 MG tablet Take 1 tablet (80 mg total) by mouth daily at 6 PM. 04/16/18  Yes Herminio Commons, MD  carvedilol (COREG) 3.125 MG tablet Take 1 tablet (3.125 mg total) by mouth 2 (two) times daily. 07/10/18 10/08/18 Yes Herminio Commons, MD  Cholecalciferol (VITAMIN D-3) 5000 units TABS Take 5,000 Units by mouth daily.    Yes [provider]  clopidogrel (PLAVIX) 75 MG tablet Take 1 tablet (75 mg total) by mouth daily with breakfast. 03/27/18  Yes Strader, [place] M, PA-C  Fluocinolone Acetonide (DERMOTIC) 0.01 % OIL Place 1 drop into both ears at bedtime.    Yes [provider]  furosemide (LASIX) 20 MG tablet Take 1 tablet (20 mg total) by mouth as needed. 07/17/18 08/16/18 Yes Herminio Commons, MD  GABAPENTIN PO Take 1,200 mg by mouth at bedtime.    Yes [provider]  Insulin Human (INSULIN PUMP) SOLN Inject into the skin continuous.    Yes [provider]  levocetirizine (XYZAL) 5 MG tablet Take 5 mg by mouth daily with breakfast.   Yes [provider]  levothyroxine (SYNTHROID, LEVOTHROID) 125 MCG tablet Take 125 mcg by mouth daily before breakfast.  11/01/16  Yes [provider]  metFORMIN (GLUCOPHAGE) 500 MG tablet Take 1 tablet (500 mg total) by mouth 2 (two) times daily with a meal. Do not take 03/23/2018 and 03/24/2018. Restart taking from 03/25/2018. Patient taking differently: Take 500 mg by mouth 2 (two) times daily with a meal.  03/23/18  Yes Hongalgi, Lenis Dickinson, MD  Multiple Vitamins-Minerals (CENTRUM SILVER 50+WOMEN) TABS Take 1 tablet by  mouth daily.    Yes [provider]  nitroGLYCERIN (NITROSTAT) 0.4 MG SL tablet Place 1 tablet (0.4 mg total) under the tongue every 5 (five) minutes as needed for chest pain. 04/17/18  Yes Strader, [place] M, PA-C  NOVOLOG 100 UNIT/ML injection Inject into the skin continuous.  02/24/18  Yes [provider]  Omega-3 Fatty Acids (FISH OIL) 1000 MG CAPS Take 1,000 mg by mouth daily.   Yes [provider]  pantoprazole (PROTONIX) 40 MG tablet Take 1 tablet (40 mg total) by mouth 2 (two) times daily before a meal. 03/23/18 08/11/18 Yes Hongalgi, Lenis Dickinson, MD  Polyethylene Glycol 400 (BLINK TEARS) 0.25 % SOLN Apply  1 drop to eye daily as needed (for eye irritation/dryness).   Yes [provider]  tizanidine (ZANAFLEX) 2 MG capsule Take 1 capsule (2 mg total) by mouth at bedtime. 08/06/18  Yes Tomi Likens, Tacha Manni R, DO  Topiramate ER (TROKENDI XR) 100 MG CP24 Take 100 mg by mouth daily.   Yes [provider]  EPINEPHrine 0.3 mg/0.3 mL IJ SOAJ injection Inject 0.3 mg into the muscle as needed. 04/24/18   [provider]  topiramate (TOPAMAX) 50 MG tablet TAKE 1 TABLET BY MOUTH ONCE DAILY Patient not taking: No sig reported 01/04/18   Pieter Partridge, DO  topiramate (TOPAMAX) 50 MG tablet Take 1 tablet (50 mg total) by mouth daily. Patient not taking: Reported on 08/11/2018 08/06/18   Pieter Partridge, DO    Family History Family History  Problem Relation Age of Onset  . Cancer Mother 60       colon  . Cancer Father        renal cell  . Cancer Brother        bladder  . Cancer Sister        primary brain, and primary breast too  . Cancer Sister        breast   . Cancer Other        multiple aunts with breast, cervical, or colon cancer    Social History Social History   Tobacco Use  . Smoking status: Never Smoker  . Smokeless tobacco: Never Used  Substance Use Topics  . Alcohol use: No    Alcohol/week: 0.0 standard drinks  . Drug use: No     Allergies    Fentanyl   Review of Systems Review of Systems  Constitutional: Negative for chills and fever.  HENT: Negative for congestion, ear pain and sore throat.   Eyes: Negative for blurred vision, pain and visual disturbance.  Respiratory: Negative for cough and shortness of breath.   Cardiovascular: Negative for chest pain and palpitations.  Gastrointestinal: Negative for abdominal pain and vomiting.  Genitourinary: Negative for dysuria and hematuria.  Musculoskeletal: Negative for arthralgias and back pain.  Skin: Negative for color change and rash.  Neurological: Positive for headaches. Negative for dizziness, tremors, seizures, syncope, facial asymmetry, speech difficulty, weakness, light-headedness, numbness and paresthesias.  All other systems reviewed and are negative.    Physical Exam Updated Vital Signs BP 134/63   Pulse 74   Temp 98 F (36.7 C) (Oral)   Resp 17   Ht 5\' 4"  (1.626 m)   Wt 118.4 kg   SpO2 95%   BMI 44.80 kg/m   Physical Exam Vitals signs and nursing note reviewed.  Constitutional:      General: She is not in acute distress.    Appearance: She is well-developed.  HENT:     Head: Normocephalic and atraumatic.     Mouth/Throat:     Mouth: Mucous membranes are moist.  Eyes:     General: No visual field deficit.    Extraocular Movements: Extraocular movements intact.     Right eye: Normal extraocular motion and no nystagmus.     Left eye: Normal extraocular motion and no nystagmus.     Conjunctiva/sclera: Conjunctivae normal.     Pupils: Pupils are equal, round, and reactive to light.     Right eye: Pupil is reactive.     Left eye: Pupil is reactive.  Neck:     Musculoskeletal: Normal range of motion and neck supple.  Cardiovascular:  Rate and Rhythm: Normal rate and regular rhythm.     Heart sounds: No murmur.  Pulmonary:     Effort: Pulmonary effort is normal. No respiratory distress.     Breath sounds: Normal breath sounds.  Abdominal:      Palpations: Abdomen is soft.     Tenderness: There is no abdominal tenderness.  Musculoskeletal: Normal range of motion.        General: No tenderness.  Skin:    General: Skin is warm and dry.  Neurological:     Mental Status: She is alert and oriented to person, place, and time.     Cranial Nerves: No cranial nerve deficit or dysarthria.     Sensory: No sensory deficit.     Motor: No weakness.     Coordination: Coordination normal.     Comments: 5+ out of 5 strength throughout, normal sensation, no drift, normal finger-to-nose finger, no visual field deficit  Psychiatric:        Mood and Affect: Mood normal.      ED Treatments / Results  Labs (all labs ordered are listed, but only abnormal results are displayed) Labs Reviewed  BASIC METABOLIC PANEL - Abnormal; Notable for the following components:      Result Value   Creatinine, Ser 1.02 (*)    All other components within normal limits  CBC WITH DIFFERENTIAL/PLATELET  CBC WITH DIFFERENTIAL/PLATELET    EKG None  Radiology Ct Angio Head W Or Wo Contrast  Result Date: 08/11/2018 CLINICAL DATA:  Initial evaluation for acute headache. EXAM: CT ANGIOGRAPHY HEAD AND NECK TECHNIQUE: Multidetector CT imaging of the head and neck was performed using the standard protocol during bolus administration of intravenous contrast. Multiplanar CT image reconstructions and MIPs were obtained to evaluate the vascular anatomy. Carotid stenosis measurements (when applicable) are obtained utilizing NASCET criteria, using the distal internal carotid diameter as the denominator. CONTRAST:  60mL ISOVUE-370 IOPAMIDOL (ISOVUE-370) INJECTION 76% COMPARISON:  Recent CT from 08/08/2018. FINDINGS: CT HEAD FINDINGS Brain: Mild age-related cerebral atrophy. No acute intracranial hemorrhage. No acute large vessel territory infarct. No mass lesion, midline shift or mass effect. No hydrocephalus. No extra-axial fluid collection. Vascular: No hyperdense vessel.  Scattered vascular calcifications noted within the carotid siphons. Skull: Scalp soft tissues and calvarium within normal limits. Sinuses: Moderate mucosal thickening within the anterior ethmoidal air cells. Paranasal sinuses are otherwise clear. No mastoid effusion. Orbits: Globes and orbital soft tissues within normal limits. Review of the MIP images confirms the above findings CTA NECK FINDINGS Aortic arch: Visualized aortic arch of normal caliber with normal branch pattern. Mild atheromatous plaque within the aortic arch and about the origin of the subclavian arteries without significant stenosis. Subclavian arteries patent distally. Right carotid system: Right common carotid artery patent from its origin to the bifurcation without stenosis or other abnormality. Calcified plaque at the right bifurcation with associated stenosis of up to 30% by NASCET criteria. Additional approximate 30% stenosis just distally within the proximal right ICA (series 7, image 207). Right ICA otherwise widely patent distally without stenosis, dissection, or occlusion. Left carotid system: Left common carotid artery widely patent from its origin to the bifurcation without stenosis. A centric calcified plaque at the proximal left ICA with mild approximate 30% stenosis. Left ICA widely patent distally without stenosis, dissection, or occlusion. Vertebral arteries: Both of the vertebral arteries arise from the subclavian arteries. Vertebral arteries widely patent without stenosis, dissection, or occlusion. Skeleton: No acute osseous abnormality. No discrete lytic or blastic  osseous lesion. Scattered mild to moderate multilevel facet arthropathy present within the cervical spine. Other neck: No other acute soft tissue abnormality within the neck. Upper chest: Unremarkable. Review of the MIP images confirms the above findings CTA HEAD FINDINGS Anterior circulation: Petrous segments widely patent bilaterally. Smooth calcified plaque  throughout the carotid siphons with mild to moderate diffuse narrowing (estimated 30-50% bilaterally). ICA termini widely patent. A1 segments, anterior communicating artery common anterior cerebral arteries well perfused and unremarkable. No M1 statement stenosis or occlusion. Normal MCA bifurcations. Distal MCA branches well perfused and symmetric. Posterior circulation: Vertebral arteries widely patent at the skull base. Mild non stenotic plaque within the dominant right V4 segment. Vertebral arteries patent to the vertebrobasilar junction without stenosis. Patent left PICA. Right PICA not well seen. Basilar widely patent to its distal aspect without stenosis. Superior cerebral arteries patent bilaterally. Both of the posterior cerebral arteries primarily supplied via the basilar well perfused to their distal aspects. Small right posterior communicating artery noted. Venous sinuses: Patent. Anatomic variants: None significant. No intracranial aneurysm or other vascular abnormality. Delayed phase: No abnormal enhancement. Review of the MIP images confirms the above findings IMPRESSION: 1. No CTA evidence for acute vascular abnormality with within the head and neck. No intracranial aneurysm. No acute intracranial abnormality. 2. Atheromatous plaque about the proximal ICAs with mild stenoses of up to 30% by NASCET criteria bilaterally. 3. Atherosclerotic change throughout the carotid siphons with mild to moderate diffuse narrowing bilaterally (estimated 30-50%). 4. Wide patency of the vertebrobasilar system. Electronically Signed   By: Jeannine Boga M.D.   On: 08/11/2018 15:24   Ct Angio Neck W And/or Wo Contrast  Result Date: 08/11/2018 CLINICAL DATA:  Initial evaluation for acute headache. EXAM: CT ANGIOGRAPHY HEAD AND NECK TECHNIQUE: Multidetector CT imaging of the head and neck was performed using the standard protocol during bolus administration of intravenous contrast. Multiplanar CT image  reconstructions and MIPs were obtained to evaluate the vascular anatomy. Carotid stenosis measurements (when applicable) are obtained utilizing NASCET criteria, using the distal internal carotid diameter as the denominator. CONTRAST:  54mL ISOVUE-370 IOPAMIDOL (ISOVUE-370) INJECTION 76% COMPARISON:  Recent CT from 08/08/2018. FINDINGS: CT HEAD FINDINGS Brain: Mild age-related cerebral atrophy. No acute intracranial hemorrhage. No acute large vessel territory infarct. No mass lesion, midline shift or mass effect. No hydrocephalus. No extra-axial fluid collection. Vascular: No hyperdense vessel. Scattered vascular calcifications noted within the carotid siphons. Skull: Scalp soft tissues and calvarium within normal limits. Sinuses: Moderate mucosal thickening within the anterior ethmoidal air cells. Paranasal sinuses are otherwise clear. No mastoid effusion. Orbits: Globes and orbital soft tissues within normal limits. Review of the MIP images confirms the above findings CTA NECK FINDINGS Aortic arch: Visualized aortic arch of normal caliber with normal branch pattern. Mild atheromatous plaque within the aortic arch and about the origin of the subclavian arteries without significant stenosis. Subclavian arteries patent distally. Right carotid system: Right common carotid artery patent from its origin to the bifurcation without stenosis or other abnormality. Calcified plaque at the right bifurcation with associated stenosis of up to 30% by NASCET criteria. Additional approximate 30% stenosis just distally within the proximal right ICA (series 7, image 207). Right ICA otherwise widely patent distally without stenosis, dissection, or occlusion. Left carotid system: Left common carotid artery widely patent from its origin to the bifurcation without stenosis. A centric calcified plaque at the proximal left ICA with mild approximate 30% stenosis. Left ICA widely patent distally without stenosis, dissection, or occlusion.  Vertebral arteries: Both of the vertebral arteries arise from the subclavian arteries. Vertebral arteries widely patent without stenosis, dissection, or occlusion. Skeleton: No acute osseous abnormality. No discrete lytic or blastic osseous lesion. Scattered mild to moderate multilevel facet arthropathy present within the cervical spine. Other neck: No other acute soft tissue abnormality within the neck. Upper chest: Unremarkable. Review of the MIP images confirms the above findings CTA HEAD FINDINGS Anterior circulation: Petrous segments widely patent bilaterally. Smooth calcified plaque throughout the carotid siphons with mild to moderate diffuse narrowing (estimated 30-50% bilaterally). ICA termini widely patent. A1 segments, anterior communicating artery common anterior cerebral arteries well perfused and unremarkable. No M1 statement stenosis or occlusion. Normal MCA bifurcations. Distal MCA branches well perfused and symmetric. Posterior circulation: Vertebral arteries widely patent at the skull base. Mild non stenotic plaque within the dominant right V4 segment. Vertebral arteries patent to the vertebrobasilar junction without stenosis. Patent left PICA. Right PICA not well seen. Basilar widely patent to its distal aspect without stenosis. Superior cerebral arteries patent bilaterally. Both of the posterior cerebral arteries primarily supplied via the basilar well perfused to their distal aspects. Small right posterior communicating artery noted. Venous sinuses: Patent. Anatomic variants: None significant. No intracranial aneurysm or other vascular abnormality. Delayed phase: No abnormal enhancement. Review of the MIP images confirms the above findings IMPRESSION: 1. No CTA evidence for acute vascular abnormality with within the head and neck. No intracranial aneurysm. No acute intracranial abnormality. 2. Atheromatous plaque about the proximal ICAs with mild stenoses of up to 30% by NASCET criteria  bilaterally. 3. Atherosclerotic change throughout the carotid siphons with mild to moderate diffuse narrowing bilaterally (estimated 30-50%). 4. Wide patency of the vertebrobasilar system. Electronically Signed   By: Jeannine Boga M.D.   On: 08/11/2018 15:24    Procedures Procedures (including critical care time)  Medications Ordered in ED Medications  iopamidol (ISOVUE-370) 76 % injection (has no administration in time range)  lactated ringers bolus 1,000 mL (1,000 mLs Intravenous New Bag/Given 08/11/18 1315)  prochlorperazine (COMPAZINE) injection 10 mg (10 mg Intravenous Given 08/11/18 1304)  dexamethasone (DECADRON) injection 10 mg (10 mg Intravenous Given 08/11/18 1304)  diphenhydrAMINE (BENADRYL) capsule 25 mg (25 mg Oral Given 08/11/18 1304)  iopamidol (ISOVUE-370) 76 % injection 75 mL (75 mLs Intravenous Contrast Given 08/11/18 1422)     Initial Impression / Assessment and Plan / ED Course  I have reviewed the triage vital signs and the nursing notes.  Pertinent labs & imaging results that were available during my care of the patient were reviewed by me and considered in my medical decision making (see chart for details).     VICTORIAN GUNN is a 57 year old female history of diabetes, chronic headaches, chronic pain, CAD who presents to the ED with headache.  Patient with unremarkable vitals.  No fever.  Patient with intermittent headache for the last 2 weeks.  Similar to prior headaches but more prolonged than normal.  She was recently seen by her primary care doctor who doubled her dose of her Topamax.  She has used some over-the-counter medications without full relief.  She had recent head CT that was unremarkable.  There is a questionable history of aneurysm.  Patient however is neurologically intact.  Has no visual field deficits.  Normal strength, normal sensation, normal gait.  Overall is well-appearing.  Will obtain CTA of the head and neck to evaluate given questionable  history of aneurysm.  Will give headache cocktail with IV Decadron, Compazine,  Benadryl.  Will give lactated Ringer bolus as well.  Has a history of CKD but thought some of that may be secondary to dehydration.  Lab work overall unremarkable.  GFR is improved from prior.  CTA of the head and neck showed no acute findings.  Patient does have some mild plaque disease bilaterally and proximal ICAs but no critical stenosis. Patient with significant improvement in headache.  Recommend follow-up with neurology as scheduled.  Believe that increased dose of Topamax will hopefully help keep headache at low levels.  Was given return precautions and discharged from ED in good condition.  No concern for infectious process at this time as well.  This chart was dictated using voice recognition software.  Despite best efforts to proofread,  errors can occur which can change the documentation meaning.   Final Clinical Impressions(s) / ED Diagnoses   Final diagnoses:  Headache disorder    ED Discharge Orders    None       Lennice Sites, DO 08/11/18 1536

## 2018-08-11 NOTE — ED Notes (Signed)
ED Provider at bedside. 

## 2018-08-11 NOTE — Discharge Instructions (Addendum)
Follow-up with your neurologist as discussed.  Continue to take Topamax.  Follow-up with your primary care provider.

## 2018-08-11 NOTE — ED Notes (Signed)
Patient transported to CT 

## 2018-08-11 NOTE — ED Notes (Signed)
Pt verbalized understanding of d/c instructions and has no further questions, VSS, NAD. Pt to follow up with neuro and pcp and to continue taking topamax.

## 2018-08-12 ENCOUNTER — Encounter (HOSPITAL_COMMUNITY)
Admission: RE | Admit: 2018-08-12 | Discharge: 2018-08-12 | Disposition: A | Discharge status: Home / Self Care | Payer: Medicare Other | Source: Ambulatory Visit | Attending: Cardiovascular Disease | Admitting: Cardiovascular Disease

## 2018-08-12 DIAGNOSIS — Z955 Presence of coronary angioplasty implant and graft: Secondary | ICD-10-CM | POA: Diagnosis not present

## 2018-08-12 NOTE — Progress Notes (Signed)
Daily Session Note  Patient Details  Name: Jodi Ray MRN: [id number] Date of Birth: 1960-10-30 Referring Provider:     CARDIAC REHAB PHASE II EXERCISE from 06/17/2018 in [place]  Referring Provider  Bronson Ing      Encounter Date: 08/12/2018  Check In: Session Check In - 08/12/18 0815      Check-In   Supervising physician immediately available to respond to emergencies  See telemetry face sheet for immediately available MD    Location  [facility]    Staff Present  Benay Pike, Exercise Physiologist;Debra Wynetta Emery, RN, BSN;Other    Medication changes reported      No    Fall or balance concerns reported     No    Tobacco Cessation  No Change    Warm-up and Cool-down  Performed as group-led instruction    Resistance Training Performed  Yes    VAD Patient?  No    PAD/SET Patient?  No      Pain Assessment   Currently in Pain?  No/denies    Pain Score  0-No pain    Multiple Pain Sites  No       Capillary Blood Glucose: Results for orders placed or performed during the hospital encounter of 08/11/18 (from the past 24 hour(s))  Basic metabolic panel     Status: Abnormal   Collection Time: 08/11/18  1:10 PM  Result Value Ref Range   Sodium 136 135 - 145 mmol/L   Potassium 4.8 3.5 - 5.1 mmol/L   Chloride 106 98 - 111 mmol/L   CO2 22 22 - 32 mmol/L   Glucose, Bld 96 70 - 99 mg/dL   BUN 19 6 - 20 mg/dL   Creatinine, Ser 1.02 (H) 0.44 - 1.00 mg/dL   Calcium 9.6 8.9 - 10.3 mg/dL   GFR calc non Af Amer >60 >60 mL/min   GFR calc Af Amer >60 >60 mL/min   Anion gap 8 5 - 15  CBC with Differential/Platelet     Status: None   Collection Time: 08/11/18  1:58 PM  Result Value Ref Range   WBC 4.2 4.0 - 10.5 K/uL   RBC 4.32 3.87 - 5.11 MIL/uL   Hemoglobin 12.4 12.0 - 15.0 g/dL   HCT 40.7 36.0 - 46.0 %   MCV 94.2 80.0 - 100.0 fL   MCH 28.7 26.0 - 34.0 pg   MCHC 30.5 30.0 - 36.0 g/dL   RDW 14.2 11.5 - 15.5 %   Platelets 208 150  - 400 K/uL   nRBC 0.0 0.0 - 0.2 %   Neutrophils Relative % 64 %   Neutro Abs 2.7 1.7 - 7.7 K/uL   Lymphocytes Relative 23 %   Lymphs Abs 1.0 0.7 - 4.0 K/uL   Monocytes Relative 8 %   Monocytes Absolute 0.4 0.1 - 1.0 K/uL   Eosinophils Relative 4 %   Eosinophils Absolute 0.2 0.0 - 0.5 K/uL   Basophils Relative 1 %   Basophils Absolute 0.0 0.0 - 0.1 K/uL   Immature Granulocytes 0 %   Abs Immature Granulocytes 0.01 0.00 - 0.07 K/uL      Social History   Tobacco Use  Smoking Status Never Smoker  Smokeless Tobacco Never Used    Goals Met:  Independence with exercise equipment Exercise tolerated well No report of cardiac concerns or symptoms Strength training completed today  Goals Unmet:  Not Applicable  Comments: Pt able to follow exercise prescription today without  complaint.  Will continue to monitor for progression. Check out 0915.   Dr. Kate Sable is Medical Director for [facility] Cardiac and Pulmonary Rehab.

## 2018-08-13 ENCOUNTER — Other Ambulatory Visit: Payer: Self-pay

## 2018-08-13 MED ORDER — PREDNISONE 10 MG (21) PO TBPK: ORAL_TABLET | ORAL | 0 refills | Status: DC

## 2018-08-14 ENCOUNTER — Encounter (HOSPITAL_COMMUNITY)
Admission: RE | Admit: 2018-08-14 | Discharge: 2018-08-14 | Disposition: A | Discharge status: Home / Self Care | Payer: Medicare Other | Source: Ambulatory Visit | Attending: Cardiovascular Disease | Admitting: Cardiovascular Disease

## 2018-08-14 DIAGNOSIS — Z955 Presence of coronary angioplasty implant and graft: Secondary | ICD-10-CM | POA: Diagnosis not present

## 2018-08-14 DIAGNOSIS — J301 Allergic rhinitis due to pollen: Secondary | ICD-10-CM | POA: Diagnosis not present

## 2018-08-14 DIAGNOSIS — J3081 Allergic rhinitis due to animal (cat) (dog) hair and dander: Secondary | ICD-10-CM | POA: Diagnosis not present

## 2018-08-14 DIAGNOSIS — J3089 Other allergic rhinitis: Secondary | ICD-10-CM | POA: Diagnosis not present

## 2018-08-14 NOTE — Progress Notes (Signed)
Daily Session Note  Patient Details  Name: Undrea J Lipari MRN: [id number] Date of Birth: 07/25/1961 Referring Provider:     CARDIAC REHAB PHASE II EXERCISE from 06/17/2018 in [place] [facility]  Referring Provider  Koneswaran      Encounter Date: 08/14/2018  Check In: Session Check In - 08/14/18 0815      Check-In   Supervising physician immediately available to respond to emergencies  See telemetry face sheet for immediately available MD    Location  [facility]    Staff Present  Amanda Ballard, Exercise Physiologist;Debra Johnson, RN, BSN;Other    Medication changes reported      No    Fall or balance concerns reported     No    Tobacco Cessation  No Change    Warm-up and Cool-down  Performed as group-led instruction    Resistance Training Performed  Yes    VAD Patient?  No    PAD/SET Patient?  No      Pain Assessment   Currently in Pain?  No/denies    Pain Score  0-No pain    Multiple Pain Sites  No       Capillary Blood Glucose: No results found for this or any previous visit (from the past 24 hour(s)).    Social History   Tobacco Use  Smoking Status Never Smoker  Smokeless Tobacco Never Used    Goals Met:  Independence with exercise equipment Exercise tolerated well No report of cardiac concerns or symptoms Strength training completed today  Goals Unmet:  Not Applicable  Comments: Pt able to follow exercise prescription today without complaint.  Will continue to monitor for progression. Check out 0915.   Dr. Suresh Koneswaran is Medical Director for [facility] and [facility] Re[facility]. 

## 2018-08-16 ENCOUNTER — Encounter (HOSPITAL_COMMUNITY)
Admission: RE | Admit: 2018-08-16 | Discharge: 2018-08-16 | Disposition: A | Discharge status: Home / Self Care | Payer: Medicare Other | Source: Ambulatory Visit | Attending: Cardiovascular Disease | Admitting: Cardiovascular Disease

## 2018-08-16 ENCOUNTER — Ambulatory Visit: Payer: Medicare Other | Admitting: Cardiovascular Disease

## 2018-08-16 DIAGNOSIS — Z955 Presence of coronary angioplasty implant and graft: Secondary | ICD-10-CM

## 2018-08-16 NOTE — Progress Notes (Addendum)
Daily Session Note  Patient Details  Name: Jodi Ray MRN: [id number] Date of Birth: 1961/05/15 Referring Provider:     CARDIAC REHAB PHASE II EXERCISE from 06/17/2018 in [place]  Referring Provider  Bronson Ing      Encounter Date: 08/16/2018  Check In: Session Check In - 08/16/18 0815      Check-In   Supervising physician immediately available to respond to emergencies  See telemetry face sheet for immediately available MD    Location  [facility]    Staff Present  Benay Pike, Exercise Physiologist;Yanique Mulvihill Wynetta Emery, RN, BSN    Medication changes reported      No    Fall or balance concerns reported     No    Warm-up and Cool-down  Performed as group-led instruction    Resistance Training Performed  Yes    VAD Patient?  No    PAD/SET Patient?  No      Pain Assessment   Currently in Pain?  No/denies    Pain Score  0-No pain    Multiple Pain Sites  No       Capillary Blood Glucose: No results found for this or any previous visit (from the past 24 hour(s)).    Social History   Tobacco Use  Smoking Status Never Smoker  Smokeless Tobacco Never Used    Goals Met:  Independence with exercise equipment Exercise tolerated well No report of cardiac concerns or symptoms Strength training completed today  Goals Unmet:  Not Applicable  Comments: Pt able to follow exercise prescription today without complaint.  Will continue to monitor for progression. Check out 915.   Dr. Kate Sable is Medical Director for [facility] Cardiac and Pulmonary Rehab.

## 2018-08-19 ENCOUNTER — Encounter (HOSPITAL_COMMUNITY)
Admission: RE | Admit: 2018-08-19 | Discharge: 2018-08-19 | Disposition: A | Discharge status: Home / Self Care | Payer: Medicare Other | Source: Ambulatory Visit | Attending: Cardiovascular Disease | Admitting: Cardiovascular Disease

## 2018-08-19 DIAGNOSIS — E1151 Type 2 diabetes mellitus with diabetic peripheral angiopathy without gangrene: Secondary | ICD-10-CM | POA: Diagnosis not present

## 2018-08-19 DIAGNOSIS — Z955 Presence of coronary angioplasty implant and graft: Secondary | ICD-10-CM | POA: Diagnosis not present

## 2018-08-19 DIAGNOSIS — E113592 Type 2 diabetes mellitus with proliferative diabetic retinopathy without macular edema, left eye: Secondary | ICD-10-CM | POA: Diagnosis not present

## 2018-08-19 DIAGNOSIS — J3081 Allergic rhinitis due to animal (cat) (dog) hair and dander: Secondary | ICD-10-CM | POA: Diagnosis not present

## 2018-08-19 DIAGNOSIS — E1140 Type 2 diabetes mellitus with diabetic neuropathy, unspecified: Secondary | ICD-10-CM | POA: Diagnosis not present

## 2018-08-19 DIAGNOSIS — J301 Allergic rhinitis due to pollen: Secondary | ICD-10-CM | POA: Diagnosis not present

## 2018-08-19 DIAGNOSIS — J3089 Other allergic rhinitis: Secondary | ICD-10-CM | POA: Diagnosis not present

## 2018-08-19 NOTE — Progress Notes (Signed)
Daily Session Note  Patient Details  Name: Jodi Ray MRN: [id number] Date of Birth: 07/09/1961 Referring Provider:     CARDIAC REHAB PHASE II EXERCISE from 06/17/2018 in [place]  Referring Provider  Bronson Ing      Encounter Date: 08/19/2018  Check In: Session Check In - 08/19/18 0815      Check-In   Supervising physician immediately available to respond to emergencies  See telemetry face sheet for immediately available MD    Location  [facility]    Staff Present  Benay Pike, Exercise Physiologist;Debra Wynetta Emery, RN, BSN    Medication changes reported      No    Fall or balance concerns reported     No    Tobacco Cessation  No Change    Warm-up and Cool-down  Performed as group-led instruction    Resistance Training Performed  Yes    VAD Patient?  No    PAD/SET Patient?  No      Pain Assessment   Currently in Pain?  No/denies    Pain Score  0-No pain    Multiple Pain Sites  No       Capillary Blood Glucose: No results found for this or any previous visit (from the past 24 hour(s)).    Social History   Tobacco Use  Smoking Status Never Smoker  Smokeless Tobacco Never Used    Goals Met:  Independence with exercise equipment Exercise tolerated well No report of cardiac concerns or symptoms Strength training completed today  Goals Unmet:  Not Applicable  Comments: Pt able to follow exercise prescription today without complaint.  Will continue to monitor for progression. Check out 0915.   Dr. Kate Sable is Medical Director for [facility] Cardiac and Pulmonary Rehab.

## 2018-08-21 ENCOUNTER — Encounter (HOSPITAL_COMMUNITY)
Admission: RE | Admit: 2018-08-21 | Discharge: 2018-08-21 | Disposition: A | Discharge status: Home / Self Care | Payer: Medicare Other | Source: Ambulatory Visit | Attending: Cardiovascular Disease | Admitting: Cardiovascular Disease

## 2018-08-21 DIAGNOSIS — Z955 Presence of coronary angioplasty implant and graft: Secondary | ICD-10-CM

## 2018-08-21 NOTE — Progress Notes (Signed)
Incomplete Session Note  Patient Details  Name: Jodi Ray MRN: [id number] Date of Birth: 1961/04/03 Referring Provider:     CARDIAC REHAB PHASE II EXERCISE from 06/17/2018 in [place]  Referring Provider  Djuana Littleton did not complete her rehab session 08/21/2018. Pt. Checked in with a headache 9/10 but insisted it would make her feel better. Near the end of her 1st exercise sessions she had to stop stating she could not continue due to her headache. She was assisted to a chair and observed and monitored. Her exit BP was 150/62. She stated she felt better and was able to leave. She is scheduled to see the neurologist tomorrow.

## 2018-08-22 ENCOUNTER — Ambulatory Visit (INDEPENDENT_AMBULATORY_CARE_PROVIDER_SITE_OTHER): Payer: Medicare Other | Admitting: Neurology

## 2018-08-22 ENCOUNTER — Other Ambulatory Visit (INDEPENDENT_AMBULATORY_CARE_PROVIDER_SITE_OTHER): Payer: Medicare Other

## 2018-08-22 ENCOUNTER — Encounter: Payer: Self-pay | Admitting: Neurology

## 2018-08-22 VITALS — BP 160/94 | HR 89 | Ht 68.0 in | Wt 250.0 lb

## 2018-08-22 DIAGNOSIS — I10 Essential (primary) hypertension: Secondary | ICD-10-CM

## 2018-08-22 DIAGNOSIS — R6889 Other general symptoms and signs: Secondary | ICD-10-CM

## 2018-08-22 DIAGNOSIS — G44001 Cluster headache syndrome, unspecified, intractable: Secondary | ICD-10-CM

## 2018-08-22 LAB — SEDIMENTATION RATE: Sed Rate: 24 mm/hr (ref 0–30)

## 2018-08-22 MED ORDER — TOPIRAMATE ER 200 MG PO CAP24: 200.0000 mg | ORAL_CAPSULE | Freq: Every day | ORAL | 3 refills | Status: DC

## 2018-08-22 NOTE — Patient Instructions (Addendum)
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  1.  We will increase Trokendi XR to total of 200mg  at bedtime:   Take 100mg  capsule and 50mg  capsule daily for 7 days, then start 200mg  capsule at bedtime. 2.  For acute pain, I will give you a lidocaine nasal spray.  Spray once in affected side (right nostril), may repeat in 15 minutes if needed. Pick up the Rx at [place] [facility] [place]. If you need to contact them, their number is [phone] 3.  Start melatonin 5 to 6mg  at bedtime. 4.  We will check a sed rate to look for any evidence of inflammation in the temporal artery. 5.  Follow up in 6 weeks  Your provider has requested that you have labwork completed today. Please go to [facility] ([id number]) on the second floor of this building before leaving the office today. You do not need to check in. If you are not called within 15 minutes please check with the front desk.

## 2018-08-22 NOTE — Progress Notes (Signed)
NEUROLOGY FOLLOW UP OFFICE NOTE  Jodi Ray [id number]  HISTORY OF PRESENT ILLNESS: Jodi Ray is a 58 year old right-handed woman with chronic pain related to complex regional pain syndrome (with spinal stimulator), hypertension, type 1 diabetes mellitus with neuropathy, OSA on CPAP, hypothyroidism, polymyalgia rheumatica who follows up for primary stabbing headache.  UPDATE: In August, she had an MI.  She takes ASA 81mg  Current preventative therapy is topiramate 50mg  at bedtime. She began having worsening headaches in late Decemeber.  The headache has changed.  Her right eye gets bloodshot and right nare runs.  Right side of face gets red.  They stabbing headache starts above the right eye and radiates, constant for a month, fluctuating in intensity.    I was out of the office for a couple of weeks when they flared up.  Tylenol provided minimal relief.  My colleague started her on tizanidine 2mg  at bedtime which was ineffective.  She had received a toradol shot by her PCP which briefly broke the headache.  She went to [facility] ED on 08/11/18.  CT of head and CTA of head and neck were performed and personally reviewed, showing no acute abnormality or aneurysm.  She was treated with a headache cocktail of Decadron 10mg , Benadryl 25mg  and Compazine 10mg .  Headaches persisted and she was prescribed a prednisone taper the next day.    Medications include: Gabapentin 400mg  at bedtime Trokendi XR 100mg  at bedtime ASA Coreg, Lasix   HISTORY: I  Primary Stabbing Headache: Onset: 2011. She was previously treated in [place], where she was diagnosed with primary stabbing headache. Location: Right frontal region Quality: stabbing Intensity: 10/10 Aura: no Prodrome: no Associated symptoms: Some nausea if severe. No autonomic symptoms. Duration: 10 to 60 minutes Frequency: 4 times a week Triggers/exacerbating factors: change in weather. Relieving factors:  none Activity: Cannot function when experiencing it.  Past preventative therapy: Indomethacin 25mg  three times daily (effective but discontinued due to elevated liver enzymes), Verapamil (ineffective)  II  Migraines: They are bi-frontal/maxillary and associated with slight nausea. She has had this before when her allergies "act up."   III  Diabetic neuropathy:  Secondary to Type 1 diabetes.  IV  Complex regional pain syndrome:  following an accident where she fell down the steps and crushed her right leg. She takes gabapentin, tramadol and has a spinal nerve stimulator. She takes this for her painful diabetic neuropathy as well.  V  Right arm pain and numbness:  Since 2015, she has had episodes of right arm pain and numbness.  It only occurs when she is laying in bed, and occurs no matter what position.  Her entire arm goes "dead".  It is both numb, painful and unable to move it.  There is no shooting pain down the arm from the neck, however she gets occasional shooting pain from the right side of her neck into the shoulder.  She has to use her other arm to shake it out and it resolves in a minute or two.  It occurs every night.  She was sent to pain management for possible cervical radiculopathy or thoracic outlet syndrome.  She did receive injections, which helped.  She had an MRI of the cervical and thoracic spine on 06/12/14.  Imaging not available, but report mentions diffuse facet arthropathy and degenerative changes in the cervical spine, but no nerve root impingement or cord compression.  There was no evidence of thoracic outlet syndrome.  She was sent to [facility]  for further evaluation.  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  They told her that her symptoms were related to her diabetic neuropathy.  VI  Cerebral aneurysm:  In 2009, she had  a CTA of the head which reportedly showed a 1-63mm aneurysm at the junction of right A1 and A2 segment of the ACA.  However, a repeat CTA performed on 02/22/12 did not reveal any aneurysm.    PAST MEDICAL HISTORY: Past Medical History:  Diagnosis Date  . 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 Anemia   . Brain aneurysm    frontal lobe  . CAD (coronary artery disease)    a. 02/2018: s/p DES to Proximal LAD with residual 20% RCA stenosis.  . Chronic pain   . Diabetes mellitus without complication ([place])   . Headache   . History of left bundle branch block (LBBB)   . HTN (hypertension)   . Hx of cardiovascular stress test 10/2016   intermediate risk study  . Hypothyroidism   . Neuromuscular disorder ([place])   . Neuropathy   . Obesity   . OSA on CPAP 03/10/2015  . Retinopathy   . Sleep apnea   . Varicose veins of both lower extremities     MEDICATIONS: Current Outpatient Medications on File Prior to Visit  Medication Sig Dispense Refill  . aspirin EC 81 MG tablet Take 81 mg by mouth daily.    Marland Kitchen atorvastatin (LIPITOR) 80 MG tablet Take 1 tablet (80 mg total) by mouth daily at 6 PM. 90 tablet 3  . carvedilol (COREG) 3.125 MG tablet Take 1 tablet (3.125 mg total) by mouth 2 (two) times daily. 180 tablet 3  . Cholecalciferol (VITAMIN D-3) 5000 units TABS Take 5,000 Units by mouth daily.     . clopidogrel (PLAVIX) 75 MG tablet Take 1 tablet (75 mg total) by mouth daily with breakfast. 90 tablet 3  . EPINEPHrine 0.3 mg/0.3 mL IJ SOAJ injection Inject 0.3 mg into the muscle as needed.  1  . Fluocinolone Acetonide (DERMOTIC) 0.01 % OIL Place 1 drop into both ears at bedtime.     . furosemide (LASIX) 20 MG tablet Take 1 tablet (20 mg total) by mouth as needed. 30 tablet 3  . GABAPENTIN PO Take 1,200 mg by mouth at bedtime.     . Insulin Human (INSULIN PUMP) SOLN Inject into the skin continuous.     Marland Kitchen levocetirizine (XYZAL) 5 MG tablet Take 5 mg by mouth daily with breakfast.    . levothyroxine (SYNTHROID,  LEVOTHROID) 125 MCG tablet Take 125 mcg by mouth daily before breakfast.   0  . metFORMIN (GLUCOPHAGE) 500 MG tablet Take 1 tablet (500 mg total) by mouth 2 (two) times daily with a meal. Do not take 03/23/2018 and 03/24/2018. Restart taking from 03/25/2018. (Patient taking differently: Take 500 mg by mouth 2 (two) times daily with a meal. )    . Multiple Vitamins-Minerals (CENTRUM SILVER 50+WOMEN) TABS Take 1 tablet by mouth daily.     . nitroGLYCERIN (NITROSTAT) 0.4 MG SL tablet Place 1 tablet (0.4 mg total) under the tongue every 5 (five) minutes as needed for chest pain. 25 tablet 3  . NOVOLOG 100 UNIT/ML injection Inject into the skin continuous.   0  . Omega-3 Fatty Acids (FISH OIL) 1000 MG CAPS Take 1,000 mg by mouth daily.    . pantoprazole (PROTONIX)  40 MG tablet Take 1 tablet (40 mg total) by mouth 2 (two) times daily before a meal. 60 tablet 0  . Polyethylene Glycol 400 (BLINK TEARS) 0.25 % SOLN Apply 1 drop to eye daily as needed (for eye irritation/dryness).    . predniSONE (STERAPRED UNI-PAK 21 TAB) 10 MG (21) TBPK tablet As directed 21 tablet 0  . tizanidine (ZANAFLEX) 2 MG capsule Take 1 capsule (2 mg total) by mouth at bedtime. 30 capsule 1  . topiramate (TOPAMAX) 50 MG tablet TAKE 1 TABLET BY MOUTH ONCE DAILY (Patient not taking: No sig reported) 90 tablet 0  . topiramate (TOPAMAX) 50 MG tablet Take 1 tablet (50 mg total) by mouth daily. (Patient not taking: Reported on 08/11/2018) 30 tablet 1  . Topiramate ER (TROKENDI XR) 100 MG CP24 Take 100 mg by mouth daily.     No current facility-administered medications on file prior to visit.     ALLERGIES: Allergies  Allergen Reactions  . Fentanyl Nausea And Vomiting and Other (See Comments)    Nausea, vomiting, loss of consciousness, requiring reversal    FAMILY HISTORY: Family History  Problem Relation Age of Onset  . Cancer Mother 38       colon  . Cancer Father        renal cell  . Cancer Brother        bladder  . Cancer  Sister        primary brain, and primary breast too  . Cancer Sister        breast   . Cancer Other        multiple aunts with breast, cervical, or colon cancer    SOCIAL HISTORY: Social History   Socioeconomic History  . Marital status: Widowed    Spouse name: Not on file  . Number of children: Not on file  . Years of education: Not on file  . Highest education level: Not on file  Occupational History  . Occupation: Retired  Scientific laboratory technician  . Financial resource strain: Not on file  . Food insecurity:    Worry: Not on file    Inability: Not on file  . Transportation needs:    Medical: Not on file    Non-medical: Not on file  Tobacco Use  . Smoking status: Never Smoker  . Smokeless tobacco: Never Used  Substance and Sexual Activity  . Alcohol use: No    Alcohol/week: 0.0 standard drinks  . Drug use: No  . Sexual activity: Yes    Partners: Male  Lifestyle  . Physical activity:    Days per week: Not on file    Minutes per session: Not on file  . Stress: Not on file  Relationships  . Social connections:    Talks on phone: Not on file    Gets together: Not on file    Attends religious service: Not on file    Active member of club or organization: Not on file    Attends meetings of clubs or organizations: Not on file    Relationship status: Not on file  . Intimate partner violence:    Fear of current or ex partner: Not on file    Emotionally abused: Not on file    Physically abused: Not on file    Forced sexual activity: Not on file  Other Topics Concern  . Not on file  Social History Narrative   Drinks about 1-2 cups of caffeine daily.    REVIEW OF SYSTEMS: Constitutional:  No fevers, chills, or sweats, no generalized fatigue, change in appetite Eyes: No visual changes, double vision, eye pain Ear, nose and throat: No hearing loss, ear pain, nasal congestion, sore throat Cardiovascular: No chest pain, palpitations Respiratory:  No shortness of breath at rest  or with exertion, wheezes GastrointestinaI: No nausea, vomiting, diarrhea, abdominal pain, fecal incontinence Genitourinary:  No dysuria, urinary retention or frequency Musculoskeletal:  No neck pain, back pain Integumentary: No rash, pruritus, skin lesions Neurological: as above Psychiatric: No depression, insomnia, anxiety Endocrine: No palpitations, fatigue, diaphoresis, mood swings, change in appetite, change in weight, increased thirst Hematologic/Lymphatic:  No purpura, petechiae. Allergic/Immunologic: no itchy/runny eyes, nasal congestion, recent allergic reactions, rashes  PHYSICAL EXAM: Blood pressure (!) 160/94, pulse 89, height 5\' 8"  (1.727 m), weight 250 lb (113.4 kg), SpO2 97 %. General: No acute distress.  Patient appears well-groomed.   Head:  Normocephalic/atraumatic Eyes:  Fundi examined but not visualized Neck: supple, no paraspinal tenderness, full range of motion Heart:  Regular rate and rhythm Lungs:  Clear to auscultation bilaterally Back: No paraspinal tenderness Neurological Exam: alert and oriented to person, place, and time. Attention span and concentration intact, recent and remote memory intact, fund of knowledge intact.  Speech fluent and not dysarthric, language intact.  CN II-XII intact. Bulk and tone normal, muscle strength 5/5 throughout.  Sensation to light touch, intact.  Deep tendon reflexes 2+ throughout.  Finger to nose testing intact.  Gait antalgic.  IMPRESSION: 1.  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 2.  HTN.  Elevation likely secondary to pain and discomfort.  Cardiac rehab was discontinued at this time.  From my standpoint, she may resume cardiac rehab only as tolerated.  I told her she shouldn't push herself.  PLAN: 1.  Titrate Trokendi XR to 200mg  at bedtime 2.  For acute pain, will prescribe lidocaine nasal spray, 1 spray in right nare every 15 minutes as needed.  If ineffective, will  prescribe home O2. 3.  Recommend starting melatonin 5 to 6 mg at bedtime 4.  Given her history of PMR, will also check sed rate to evaluate for possible temporal arteritis. 5.  Follow up with PCP regarding blood pressure 6.  Follow up in 6 weeks.  28 minutes spent face to face with patient, over 50% spent discussing diagnosis and management.  Metta Clines, DO  CC: Monico Blitz

## 2018-08-23 ENCOUNTER — Encounter (HOSPITAL_COMMUNITY)
Admission: RE | Admit: 2018-08-23 | Discharge: 2018-08-23 | Disposition: A | Discharge status: Home / Self Care | Payer: Medicare Other | Source: Ambulatory Visit | Attending: Cardiovascular Disease | Admitting: Cardiovascular Disease

## 2018-08-23 ENCOUNTER — Telehealth: Payer: Self-pay | Admitting: Neurology

## 2018-08-23 DIAGNOSIS — G4485 Primary stabbing headache: Secondary | ICD-10-CM

## 2018-08-23 NOTE — Telephone Encounter (Signed)
Patient states that the new medication that Dr Tomi Likens put her on is 376.00 a month and she can not pay that much for it. She would like to talk to someone about this please call

## 2018-08-23 NOTE — Telephone Encounter (Signed)
Discussed with Dr Tomi Likens. I called [facility] when the Pt was here yesterday and the Lidocaine was to be around $30 for 29ml. The Trokendi, should be covered once PA is processed. I am to call Pt on Monday

## 2018-08-26 ENCOUNTER — Encounter (HOSPITAL_COMMUNITY)
Admission: RE | Admit: 2018-08-26 | Discharge: 2018-08-26 | Disposition: A | Discharge status: Home / Self Care | Payer: Medicare Other | Source: Ambulatory Visit | Attending: Cardiovascular Disease | Admitting: Cardiovascular Disease

## 2018-08-26 ENCOUNTER — Other Ambulatory Visit (HOSPITAL_COMMUNITY)
Admission: RE | Admit: 2018-08-26 | Discharge: 2018-08-26 | Disposition: A | Discharge status: Home / Self Care | Payer: Medicare Other | Source: Ambulatory Visit | Attending: Nephrology | Admitting: Nephrology

## 2018-08-26 DIAGNOSIS — N183 Chronic kidney disease, stage 3 (moderate): Secondary | ICD-10-CM | POA: Insufficient documentation

## 2018-08-26 DIAGNOSIS — I10 Essential (primary) hypertension: Secondary | ICD-10-CM | POA: Insufficient documentation

## 2018-08-26 DIAGNOSIS — Z79899 Other long term (current) drug therapy: Secondary | ICD-10-CM | POA: Diagnosis not present

## 2018-08-26 DIAGNOSIS — E559 Vitamin D deficiency, unspecified: Secondary | ICD-10-CM | POA: Diagnosis not present

## 2018-08-26 DIAGNOSIS — D509 Iron deficiency anemia, unspecified: Secondary | ICD-10-CM | POA: Insufficient documentation

## 2018-08-26 DIAGNOSIS — R809 Proteinuria, unspecified: Secondary | ICD-10-CM | POA: Diagnosis not present

## 2018-08-26 DIAGNOSIS — Z955 Presence of coronary angioplasty implant and graft: Secondary | ICD-10-CM | POA: Diagnosis not present

## 2018-08-26 LAB — FERRITIN: Ferritin: 86 ng/mL (ref 11–307)

## 2018-08-26 LAB — RENAL FUNCTION PANEL
Albumin: 3.3 g/dL — ABNORMAL LOW (ref 3.5–5.0)
Anion gap: 8 (ref 5–15)
BUN: 19 mg/dL (ref 6–20)
CO2: 22 mmol/L (ref 22–32)
Calcium: 9 mg/dL (ref 8.9–10.3)
Chloride: 111 mmol/L (ref 98–111)
Creatinine, Ser: 1.01 mg/dL — ABNORMAL HIGH (ref 0.44–1.00)
GFR calc Af Amer: >60 mL/min (ref >60)
GFR calc non Af Amer: >60 mL/min (ref >60)
Glucose, Bld: 91 mg/dL (ref 70–99)
Phosphorus: 3.6 mg/dL (ref 2.5–4.6)
Potassium: 3.7 mmol/L (ref 3.5–5.1)
Sodium: 141 mmol/L (ref 135–145)

## 2018-08-26 LAB — IRON AND TIBC
Iron: 64 ug/dL (ref 28–170)
SATURATION RATIOS: 24 % (ref 10.4–31.8)
TIBC: 262 ug/dL (ref 250–450)
UIBC: 198 ug/dL

## 2018-08-26 LAB — PROTEIN / CREATININE RATIO, URINE
Creatinine, Urine: 98.72 mg/dL
Protein Creatinine Ratio: 0.21 mg/mg{Cre} — ABNORMAL HIGH (ref 0.00–0.15)
Total Protein, Urine: 21 mg/dL

## 2018-08-26 LAB — HEMOGLOBIN AND HEMATOCRIT, BLOOD
HCT: 36.7 % (ref 36.0–46.0)
HEMOGLOBIN: 11.5 g/dL — AB (ref 12.0–15.0)

## 2018-08-26 NOTE — Telephone Encounter (Signed)
Called and spoke with Pt. I asked which medication she was talking about, she said Trokendi. Pt is unfortunately not eligible for co-pay card since she is on [facility]. I mistakenly advised her she could pick up a co-pay card. I tried to call her back, no answer. X2. Will try again tomorrow.

## 2018-08-26 NOTE — Progress Notes (Signed)
Daily Session Note  Patient Details  Name: Jodi Ray MRN: [id number] Date of Birth: Sep 01, 1960 Referring Provider:     [place] from 06/17/2018 in [place]  Referring Provider  Bronson Ing      Encounter Date: 08/26/2018  Check In: Session Check In - 08/26/18 0815      Check-In   Supervising physician immediately available to respond to emergencies  See telemetry face sheet for immediately available MD    Location  [facility]    Staff Present  Benay Pike, Exercise Physiologist;Debra Wynetta Emery, RN, BSN;Diane Coad, MS, EP, [facility], Exercise Physiologist    Medication changes reported      No    Fall or balance concerns reported     No    Tobacco Cessation  No Change    Warm-up and Cool-down  Performed as group-led instruction    Resistance Training Performed  Yes    VAD Patient?  No    PAD/SET Patient?  No      Pain Assessment   Currently in Pain?  No/denies    Multiple Pain Sites  No       Capillary Blood Glucose: No results found for this or any previous visit (from the past 24 hour(s)).    Social History   Tobacco Use  Smoking Status Never Smoker  Smokeless Tobacco Never Used    Goals Met:  Independence with exercise equipment Exercise tolerated well No report of cardiac concerns or symptoms Strength training completed today  Goals Unmet:  Not Applicable  Comments: Pt able to follow exercise prescription today without complaint.  Will continue to monitor for progression. Check out 0915.   Dr. Kate Sable is Medical Director for [facility] Cardiac and Pulmonary Rehab.

## 2018-08-27 LAB — PARATHYROID HORMONE, INTACT (NO CA): PTH: 51 pg/mL (ref 15–65)

## 2018-08-27 LAB — VITAMIN D 25 HYDROXY (VIT D DEFICIENCY, FRACTURES): Vit D, 25-Hydroxy: 33 ng/mL (ref 30.0–100.0)

## 2018-08-27 MED ORDER — TOPIRAMATE 200 MG PO TABS: 200.0000 mg | ORAL_TABLET | Freq: Every day | ORAL | 5 refills | Status: DC

## 2018-08-27 NOTE — Addendum Note (Signed)
Addended by: Clois Comber on: 08/27/2018 01:25 PM   Modules accepted: Orders

## 2018-08-27 NOTE — Telephone Encounter (Signed)
Called and spoke with Pt. I advised her I have discussed this with Dr. Tomi Likens, he said it is ok for her to use the IR topiramate. I advised Pt I will send in Rx for 200 mg to [facility]

## 2018-08-28 ENCOUNTER — Telehealth: Payer: Self-pay | Admitting: *Deleted

## 2018-08-28 ENCOUNTER — Encounter (HOSPITAL_COMMUNITY)
Admission: RE | Admit: 2018-08-28 | Discharge: 2018-08-28 | Disposition: A | Discharge status: Home / Self Care | Payer: Medicare Other | Source: Ambulatory Visit | Attending: Cardiovascular Disease | Admitting: Cardiovascular Disease

## 2018-08-28 ENCOUNTER — Encounter: Payer: Self-pay | Admitting: *Deleted

## 2018-08-28 DIAGNOSIS — J3081 Allergic rhinitis due to animal (cat) (dog) hair and dander: Secondary | ICD-10-CM | POA: Diagnosis not present

## 2018-08-28 DIAGNOSIS — Z955 Presence of coronary angioplasty implant and graft: Secondary | ICD-10-CM | POA: Diagnosis not present

## 2018-08-28 DIAGNOSIS — J3089 Other allergic rhinitis: Secondary | ICD-10-CM | POA: Diagnosis not present

## 2018-08-28 DIAGNOSIS — J301 Allergic rhinitis due to pollen: Secondary | ICD-10-CM | POA: Diagnosis not present

## 2018-08-28 NOTE — Telephone Encounter (Signed)
Results sent via My Chart.  

## 2018-08-28 NOTE — Telephone Encounter (Signed)
-----   Message from Pieter Partridge, DO sent at 08/23/2018  6:31 AM EST ----- Sed Rate is normal.

## 2018-08-28 NOTE — Progress Notes (Signed)
Daily Session Note  Patient Details  Name: Jodi Ray MRN: [id number] Date of Birth: Jul 04, 1961 Referring Provider:     [place] from 06/17/2018 in [place]  Referring Provider  Bronson Ing      Encounter Date: 08/28/2018  Check In: Session Check In - 08/28/18 0815      Check-In   Supervising physician immediately available to respond to emergencies  See telemetry face sheet for immediately available MD    Location  [facility]    Staff Present  Benay Pike, Exercise Physiologist;Debra Wynetta Emery, RN, BSN;Diane Coad, MS, EP, [facility], Exercise Physiologist    Medication changes reported      No    Fall or balance concerns reported     No    Tobacco Cessation  No Change    Warm-up and Cool-down  Performed as group-led instruction    Resistance Training Performed  Yes    VAD Patient?  No    PAD/SET Patient?  No      Pain Assessment   Currently in Pain?  No/denies    Multiple Pain Sites  No       Capillary Blood Glucose: No results found for this or any previous visit (from the past 24 hour(s)).    Social History   Tobacco Use  Smoking Status Never Smoker  Smokeless Tobacco Never Used    Goals Met:  Proper associated with RPD/PD & O2 Sat Independence with exercise equipment Exercise tolerated well No report of cardiac concerns or symptoms Strength training completed today  Goals Unmet:  Not Applicable  Comments: Pt able to follow exercise prescription today without complaint.  Will continue to monitor for progression. Check out 0915.   Dr. Kate Sable is Medical Director for [facility] Cardiac and Pulmonary Rehab.

## 2018-08-29 DIAGNOSIS — M159 Polyosteoarthritis, unspecified: Secondary | ICD-10-CM | POA: Diagnosis not present

## 2018-08-29 DIAGNOSIS — E119 Type 2 diabetes mellitus without complications: Secondary | ICD-10-CM | POA: Diagnosis not present

## 2018-08-29 DIAGNOSIS — I10 Essential (primary) hypertension: Secondary | ICD-10-CM | POA: Diagnosis not present

## 2018-08-30 ENCOUNTER — Encounter (HOSPITAL_COMMUNITY)
Admission: RE | Admit: 2018-08-30 | Discharge: 2018-08-30 | Disposition: A | Discharge status: Home / Self Care | Payer: Medicare Other | Source: Ambulatory Visit | Attending: Cardiovascular Disease | Admitting: Cardiovascular Disease

## 2018-08-30 DIAGNOSIS — Z955 Presence of coronary angioplasty implant and graft: Secondary | ICD-10-CM

## 2018-08-30 NOTE — Progress Notes (Signed)
Daily Session Note  Patient Details  Name: VALKYRIE GUARDIOLA MRN: [id number] Date of Birth: 1960/09/02 Referring Provider:     CARDIAC REHAB PHASE II EXERCISE from 06/17/2018 in [place]  Referring Provider  Bronson Ing      Encounter Date: 08/30/2018  Check In: Session Check In - 08/30/18 0815      Check-In   Supervising physician immediately available to respond to emergencies  See telemetry face sheet for immediately available MD    Location  [facility]    Staff Present  Benay Pike, Exercise Physiologist;Diane Coad, MS, EP, [facility], Exercise Physiologist;Other    Medication changes reported      No    Fall or balance concerns reported     No    Tobacco Cessation  No Change    Warm-up and Cool-down  Performed as group-led instruction    Resistance Training Performed  Yes    VAD Patient?  No    PAD/SET Patient?  No      Pain Assessment   Currently in Pain?  No/denies    Pain Score  0-No pain    Multiple Pain Sites  No       Capillary Blood Glucose: No results found for this or any previous visit (from the past 24 hour(s)).    Social History   Tobacco Use  Smoking Status Never Smoker  Smokeless Tobacco Never Used    Goals Met:  Proper associated with RPD/PD & O2 Sat Independence with exercise equipment Exercise tolerated well No report of cardiac concerns or symptoms Strength training completed today  Goals Unmet:  Not Applicable  Comments: Pt able to follow exercise prescription today without complaint.  Will continue to monitor for progression. Check out 0915.   Dr. Kate Sable is Medical Director for [facility] Cardiac and Pulmonary Rehab.

## 2018-09-02 ENCOUNTER — Encounter (HOSPITAL_COMMUNITY)
Admission: RE | Admit: 2018-09-02 | Discharge: 2018-09-02 | Disposition: A | Discharge status: Home / Self Care | Payer: Medicare Other | Source: Ambulatory Visit | Attending: Cardiovascular Disease | Admitting: Cardiovascular Disease

## 2018-09-02 DIAGNOSIS — Z955 Presence of coronary angioplasty implant and graft: Secondary | ICD-10-CM

## 2018-09-02 NOTE — Progress Notes (Signed)
Daily Session Note  Patient Details  Name: Jodi Ray MRN: [id number] Date of Birth: 05-14-1961 Referring Provider:     CARDIAC REHAB PHASE II EXERCISE from 06/17/2018 in [place]  Referring Provider  Bronson Ing      Encounter Date: 09/02/2018  Check In: Session Check In - 09/02/18 0815      Check-In   Supervising physician immediately available to respond to emergencies  See telemetry face sheet for immediately available MD    Location  [facility]    Staff Present  Benay Pike, Exercise Physiologist;Diane Coad, MS, EP, [facility], Exercise Physiologist;Debra Wynetta Emery, RN, BSN    Medication changes reported      No    Fall or balance concerns reported     No    Tobacco Cessation  No Change    Warm-up and Cool-down  Performed as group-led instruction    Resistance Training Performed  Yes    VAD Patient?  No    PAD/SET Patient?  No      Pain Assessment   Currently in Pain?  Yes    Pain Score  7     Pain Location  Head    Pain Orientation  Right    Pain Descriptors / Indicators  Aching    Pain Type  Chronic pain    Multiple Pain Sites  No       Capillary Blood Glucose: No results found for this or any previous visit (from the past 24 hour(s)).    Social History   Tobacco Use  Smoking Status Never Smoker  Smokeless Tobacco Never Used    Goals Met:  Proper associated with RPD/PD & O2 Sat Independence with exercise equipment Exercise tolerated well No report of cardiac concerns or symptoms Strength training completed today  Goals Unmet:  Not Applicable  Comments: Pt able to follow exercise prescription today without complaint.  Will continue to monitor for progression. Check out 0915.   Dr. Kate Sable is Medical Director for [facility] Cardiac and Pulmonary Rehab.

## 2018-09-03 DIAGNOSIS — E1121 Type 2 diabetes mellitus with diabetic nephropathy: Secondary | ICD-10-CM | POA: Diagnosis not present

## 2018-09-03 DIAGNOSIS — I501 Left ventricular failure: Secondary | ICD-10-CM | POA: Diagnosis not present

## 2018-09-03 DIAGNOSIS — N182 Chronic kidney disease, stage 2 (mild): Secondary | ICD-10-CM | POA: Diagnosis not present

## 2018-09-03 DIAGNOSIS — I10 Essential (primary) hypertension: Secondary | ICD-10-CM | POA: Diagnosis not present

## 2018-09-04 ENCOUNTER — Encounter (HOSPITAL_COMMUNITY)
Admission: RE | Admit: 2018-09-04 | Discharge: 2018-09-04 | Disposition: A | Discharge status: Home / Self Care | Payer: Medicare Other | Source: Ambulatory Visit | Attending: Cardiovascular Disease | Admitting: Cardiovascular Disease

## 2018-09-04 DIAGNOSIS — J301 Allergic rhinitis due to pollen: Secondary | ICD-10-CM | POA: Diagnosis not present

## 2018-09-04 DIAGNOSIS — J3089 Other allergic rhinitis: Secondary | ICD-10-CM | POA: Diagnosis not present

## 2018-09-04 DIAGNOSIS — Z955 Presence of coronary angioplasty implant and graft: Secondary | ICD-10-CM

## 2018-09-04 DIAGNOSIS — J3081 Allergic rhinitis due to animal (cat) (dog) hair and dander: Secondary | ICD-10-CM | POA: Diagnosis not present

## 2018-09-04 NOTE — Progress Notes (Signed)
Daily Session Note  Patient Details  Name: Jodi Ray MRN: [id number] Date of Birth: Jul 20, 1961 Referring Provider:     CARDIAC REHAB PHASE II EXERCISE from 06/17/2018 in [place]  Referring Provider  Bronson Ing      Encounter Date: 09/04/2018  Check In: Session Check In - 09/04/18 0815      Check-In   Supervising physician immediately available to respond to emergencies  See telemetry face sheet for immediately available MD    Location  [facility]    Staff Present  Benay Pike, Exercise Physiologist;Diane Coad, MS, EP, [facility], Exercise Physiologist;Debra Wynetta Emery, RN, BSN    Medication changes reported      No    Fall or balance concerns reported     No    Tobacco Cessation  No Change    Warm-up and Cool-down  Performed as group-led instruction    Resistance Training Performed  Yes    VAD Patient?  No    PAD/SET Patient?  No      Pain Assessment   Currently in Pain?  No/denies    Pain Score  0-No pain    Multiple Pain Sites  No       Capillary Blood Glucose: No results found for this or any previous visit (from the past 24 hour(s)).    Social History   Tobacco Use  Smoking Status Never Smoker  Smokeless Tobacco Never Used    Goals Met:  Proper associated with RPD/PD & O2 Sat Independence with exercise equipment Exercise tolerated well No report of cardiac concerns or symptoms Strength training completed today  Goals Unmet:  Not Applicable  Comments: Pt able to follow exercise prescription today without complaint.  Will continue to monitor for progression. Check out 0915.   Dr. Kate Sable is Medical Director for [facility] Cardiac and Pulmonary Rehab.

## 2018-09-05 DIAGNOSIS — J301 Allergic rhinitis due to pollen: Secondary | ICD-10-CM | POA: Diagnosis not present

## 2018-09-05 DIAGNOSIS — J3089 Other allergic rhinitis: Secondary | ICD-10-CM | POA: Diagnosis not present

## 2018-09-05 DIAGNOSIS — J3081 Allergic rhinitis due to animal (cat) (dog) hair and dander: Secondary | ICD-10-CM | POA: Diagnosis not present

## 2018-09-05 NOTE — Progress Notes (Signed)
Cardiac Individual Treatment Plan  Patient Details  Name: Jodi Ray MRN: [id number] Date of Birth: 1961-04-13 Referring Provider:     CARDIAC REHAB PHASE II EXERCISE from 06/17/2018 in [place]  Referring Provider  Bronson Ing      Initial Encounter Date:    CARDIAC REHAB PHASE II EXERCISE from 06/17/2018 in [place]  Date  06/17/18      Visit Diagnosis: Status post coronary artery stent placement  Patient's Home Medications on Admission:  Current Outpatient Medications:  .  aspirin EC 81 MG tablet, Take 81 mg by mouth daily., Disp: , Rfl:  .  atorvastatin (LIPITOR) 80 MG tablet, Take 1 tablet (80 mg total) by mouth daily at 6 PM., Disp: 90 tablet, Rfl: 3 .  carvedilol (COREG) 3.125 MG tablet, Take 1 tablet (3.125 mg total) by mouth 2 (two) times daily., Disp: 180 tablet, Rfl: 3 .  Cholecalciferol (VITAMIN D-3) 5000 units TABS, Take 5,000 Units by mouth daily. , Disp: , Rfl:  .  clopidogrel (PLAVIX) 75 MG tablet, Take 1 tablet (75 mg total) by mouth daily with breakfast., Disp: 90 tablet, Rfl: 3 .  EPINEPHrine 0.3 mg/0.3 mL IJ SOAJ injection, Inject 0.3 mg into the muscle as needed., Disp: , Rfl: 1 .  Fluocinolone Acetonide (DERMOTIC) 0.01 % OIL, Place 1 drop into both ears at bedtime. , Disp: , Rfl:  .  furosemide (LASIX) 20 MG tablet, Take 1 tablet (20 mg total) by mouth as needed., Disp: 30 tablet, Rfl: 3 .  gabapentin (NEURONTIN) 400 MG capsule, Take 400 mg by mouth at bedtime., Disp: , Rfl:  .  gabapentin (NEURONTIN) 800 MG tablet, Take 800 mg by mouth at bedtime., Disp: , Rfl:  .  Insulin Human (INSULIN PUMP) SOLN, Inject into the skin continuous. , Disp: , Rfl:  .  levocetirizine (XYZAL) 5 MG tablet, Take 5 mg by mouth daily with breakfast., Disp: , Rfl:  .  levothyroxine (SYNTHROID, LEVOTHROID) 125 MCG tablet, Take 125 mcg by mouth daily before breakfast. , Disp: , Rfl: 0 .  metFORMIN (GLUCOPHAGE) 500 MG tablet, Take 1  tablet (500 mg total) by mouth 2 (two) times daily with a meal. Do not take 03/23/2018 and 03/24/2018. Restart taking from 03/25/2018. (Patient taking differently: Take 500 mg by mouth 2 (two) times daily with a meal. ), Disp: , Rfl:  .  Multiple Vitamins-Minerals (CENTRUM SILVER 50+WOMEN) TABS, Take 1 tablet by mouth daily. , Disp: , Rfl:  .  nitroGLYCERIN (NITROSTAT) 0.4 MG SL tablet, Place 1 tablet (0.4 mg total) under the tongue every 5 (five) minutes as needed for chest pain., Disp: 25 tablet, Rfl: 3 .  NOVOLOG 100 UNIT/ML injection, Inject into the skin continuous. , Disp: , Rfl: 0 .  Omega-3 Fatty Acids (FISH OIL) 1000 MG CAPS, Take 1,000 mg by mouth daily., Disp: , Rfl:  .  pantoprazole (PROTONIX) 40 MG tablet, Take 1 tablet (40 mg total) by mouth 2 (two) times daily before a meal., Disp: 60 tablet, Rfl: 0 .  Polyethylene Glycol 400 (BLINK TEARS) 0.25 % SOLN, Apply 1 drop to eye daily as needed (for eye irritation/dryness)., Disp: , Rfl:  .  predniSONE (STERAPRED UNI-PAK 21 TAB) 10 MG (21) TBPK tablet, As directed, Disp: 21 tablet, Rfl: 0 .  tizanidine (ZANAFLEX) 2 MG capsule, Take 1 capsule (2 mg total) by mouth at bedtime., Disp: 30 capsule, Rfl: 1 .  topiramate (TOPAMAX) 200 MG tablet, Take 1 tablet (200 mg  total) by mouth daily., Disp: 30 tablet, Rfl: 5 .  Topiramate ER (TROKENDI XR) 200 MG CP24, Take 200 mg by mouth at bedtime., Disp: 30 capsule, Rfl: 3  Past Medical History: Past Medical History:  Diagnosis Date  . 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 Anemia   . Brain aneurysm    frontal lobe  . CAD (coronary artery disease)    a. 02/2018: s/p DES to Proximal LAD with residual 20% RCA stenosis.  . Chronic pain   . Diabetes mellitus without complication ([place])   . Headache   . History of left bundle branch block (LBBB)   . HTN (hypertension)   . Hx of cardiovascular stress test 10/2016   intermediate risk study  . Hypothyroidism   . Neuromuscular disorder ([place])   . Neuropathy   . Obesity   .  OSA on CPAP 03/10/2015  . Retinopathy   . Sleep apnea   . Varicose veins of both lower extremities     Tobacco Use: Social History   Tobacco Use  Smoking Status Never Smoker  Smokeless Tobacco Never Used    Labs: Recent Review Flowsheet Data    Labs for ITP Cardiac and Pulmonary Rehab Latest Ref Rng & Units 01/15/2015 07/23/2015 03/20/2018 04/16/2018   Cholestrol 0 - 200 mg/dL - - - 134   LDLCALC 0 - 99 mg/dL - - - 54   HDL >40 mg/dL - - - 75   Trlycerides <150 mg/dL - - - 23   Hemoglobin A1c 4.8 - 5.6 % 9.6(A) 9.1(H) 7.8(H) -      Capillary Blood Glucose: Lab Results  Component Value Date   GLUCAP 196 (H) 04/17/2018   GLUCAP 128 (H) 04/16/2018   GLUCAP 108 (H) 04/16/2018   GLUCAP 195 (H) 04/16/2018   GLUCAP 107 (H) 04/16/2018     Exercise Target Goals: Exercise Program Goal: Individual exercise prescription set using results from initial 6 min walk test and THRR while considering  patient's activity barriers and safety.   Exercise Prescription Goal: Starting with aerobic activity 30 plus minutes a day, 3 days per week for initial exercise prescription. Provide home exercise prescription and guidelines that participant acknowledges understanding prior to discharge.  Activity Barriers & Risk Stratification: Activity Barriers & Cardiac Risk Stratification - 06/17/18 1413      Activity Barriers & Cardiac Risk Stratification   Activity Barriers  Balance Concerns;Muscular Weakness;Other (comment)    Comments  bilateral foot problems     Cardiac Risk Stratification  High       6 Minute Walk: 6 Minute Walk    Row Name 06/17/18 1411         6 Minute Walk   Phase  Initial     Distance  900 feet     Walk Time  6 minutes     # of Rest Breaks  0     MPH  1.7     METS  2.3     RPE  7     Perceived Dyspnea   9     VO2 Peak  9.54     Symptoms  No     Resting HR  94 bpm     Resting BP  116/60     Resting Oxygen Saturation   94 %     Exercise Oxygen Saturation   during 6 min walk  98 %     Max Ex. HR  134 bpm     Max Ex. BP  162/68     2 Minute Post BP  120/60        Oxygen Initial Assessment:   Oxygen Re-Evaluation:   Oxygen Discharge (Final Oxygen Re-Evaluation):   Initial Exercise Prescription: Initial Exercise Prescription - 06/17/18 1400      Date of Initial Exercise RX and Referring Provider   Date  06/17/18    Referring Provider  Koneswaran    Expected Discharge Date  09/17/18      NuStep   Level  1    SPM  102    Minutes  17    METs  2.2      Arm Ergometer   Level  1    Watts  17    RPM  60    Minutes  17    METs  2.1      Prescription Details   Frequency (times per week)  3    Duration  Progress to 30 minutes of continuous aerobic without signs/symptoms of physical distress      Intensity   THRR 40-80% of Max Heartrate  [phone]    Ratings of Perceived Exertion  11-13    Perceived Dyspnea  0-4      Progression   Progression  Continue to progress workloads to maintain intensity without signs/symptoms of physical distress.      Resistance Training   Training Prescription  Yes    Weight  1    Reps  10-15       Perform Capillary Blood Glucose checks as needed.  Exercise Prescription Changes:  Exercise Prescription Changes    Row Name 07/03/18 1400 07/16/18 1500 08/01/18 0800 08/13/18 1500 08/27/18 1200     Response to Exercise   Blood Pressure (Admit)  152/64  122/68  126/64  126/78  136/64   Blood Pressure (Exercise)  184/70  172/70  146/80  140/82  148/74   Blood Pressure (Exit)  130/58  116/62  118/62  102/62  98/50   Heart Rate (Admit)  81 bpm  72 bpm  106 bpm  96 bpm  87 bpm   Heart Rate (Exercise)  113 bpm  99 bpm  97 bpm  168 bpm  112 bpm   Heart Rate (Exit)  96 bpm  78 bpm  102 bpm  105 bpm  94 bpm   Rating of Perceived Exertion (Exercise)  _0 Comments  First two weeks of exercise!   -  -  -  -   Duration  Progress to 30 minutes of  aerobic without signs/symptoms of  physical distress  Progress to 30 minutes of  aerobic without signs/symptoms of physical distress  Continue with 30 min of aerobic exercise without signs/symptoms of physical distress.  Continue with 30 min of aerobic exercise without signs/symptoms of physical distress.  Continue with 30 min of aerobic exercise without signs/symptoms of physical distress.   Intensity  THRR New 120-134-147  THRR unchanged  THRR unchanged  THRR unchanged  THRR unchanged     Progression   Progression  Continue to progress workloads to maintain intensity without signs/symptoms of physical distress.  Continue to progress workloads to maintain intensity without signs/symptoms of physical distress.  Continue to progress workloads to maintain intensity without signs/symptoms of physical distress.  Continue to progress workloads to maintain intensity without signs/symptoms of physical distress.  Continue to progress workloads to maintain intensity without signs/symptoms of physical distress.   Average METs  2.7  2.9  3.3  3.2  3.25     Resistance Training   Training Prescription  Yes  Yes  Yes  Yes  Yes   Weight  _0 Reps  10-15  10-15  10-15  10-15  10-15     NuStep   Level  _1 SPM  93  98  117  125  121   Minutes  _2 METs  2.2  2.3  2.3  2.4  2.4     Arm Ergometer   Level  1._3 Watts  30  37  80  68  44   RPM  55  79  27  38  74   Minutes  _4 METs  3.2  3.8  4.3  4  4.1     Home Exercise Plan   Plans to continue exercise at  -  -  Home (comment) walking and strethcing  Home (comment)  Home (comment)   Frequency  -  -  Add 2 additional days to program exercise sessions.  Add 2 additional days to program exercise sessions.  Add 2 additional days to program exercise sessions.   Initial Home Exercises Provided  -  -  06/17/18  06/17/18  06/17/18      Exercise Comments:  Exercise Comments    Row Name 07/16/18 1532 08/06/18 1525 09/03/18  0820       Exercise Comments  Pt. has done great in rehab so far during her first 9 sessions. She has tolerated the exercise well and pushes herself to increase workload every day. We will continue to progress her as tolerated.   Pt. has continued to work hard and progress through the program. She is up to level 3 on the NuStep as well as the Arm Ergometer. We will continue to increase her workloads as best fit.   Pt. is doing great and continues to work hard in [place]. She is doing small intervals on the NuStep with 30 second bursts of hard work and a minute of regular. She loves this and loves working hard on the equipment to improve her distance and SPM.         Exercise Goals and Review:  Exercise Goals    Row Name 06/17/18 1415             Exercise Goals   Increase Physical Activity  Yes       Intervention  Provide advice, education, support and counseling about physical activity/exercise needs.;Develop an individualized exercise prescription for aerobic and resistive training based on initial evaluation findings, risk stratification, comorbidities and participant's personal goals.       Expected Outcomes  Short Term: Attend rehab on a regular basis to increase amount of physical activity.       Increase Strength and Stamina  Yes       Intervention  Provide advice, education, support and counseling about physical activity/exercise needs.;Develop an individualized exercise prescription for aerobic and resistive training based on initial evaluation findings, risk stratification, comorbidities and participant's personal goals.       Expected Outcomes  Short Term: Increase workloads from initial exercise prescription for resistance, speed, and METs.  Able to understand and use rate of perceived exertion (RPE) scale  Yes       Intervention  Provide education and explanation on how to use RPE scale       Expected Outcomes  Short Term: Able to use RPE daily in rehab to express subjective  intensity level;Long Term:  Able to use RPE to guide intensity level when exercising independently       Able to understand and use Dyspnea scale  Yes       Intervention  Provide education and explanation on how to use Dyspnea scale       Expected Outcomes  Short Term: Able to use Dyspnea scale daily in rehab to express subjective sense of shortness of breath during exertion;Long Term: Able to use Dyspnea scale to guide intensity level when exercising independently       Knowledge and understanding of Target Heart Rate Range (THRR)  Yes       Intervention  Provide education and explanation of THRR including how the numbers were predicted and where they are located for reference       Expected Outcomes  Long Term: Able to use THRR to govern intensity when exercising independently;Short Term: Able to state/look up THRR;Short Term: Able to use daily as guideline for intensity in rehab       Able to check pulse independently  Yes       Intervention  Provide education and demonstration on how to check pulse in carotid and radial arteries.;Review the importance of being able to check your own pulse for safety during independent exercise       Expected Outcomes  Short Term: Able to explain why pulse checking is important during independent exercise;Long Term: Able to check pulse independently and accurately       Understanding of Exercise Prescription  Yes       Intervention  Provide education, explanation, and written materials on patient's individual exercise prescription       Expected Outcomes  Short Term: Able to explain program exercise prescription;Long Term: Able to explain home exercise prescription to exercise independently          Exercise Goals Re-Evaluation : Exercise Goals Re-Evaluation    Row Name 07/16/18 1531 08/06/18 1523 09/03/18 0819         Exercise Goal Re-Evaluation   Exercise Goals Review  Increase Physical Activity;Increase Strength and Stamina;Able to understand and use  rate of perceived exertion (RPE) scale;Knowledge and understanding of Target Heart Rate Range (THRR);Able to check pulse independently;Understanding of Exercise Prescription;Improve claudication pain tolerance and improve walking ability  Increase Physical Activity;Increase Strength and Stamina;Able to understand and use rate of perceived exertion (RPE) scale;Knowledge and understanding of Target Heart Rate Range (THRR);Able to check pulse independently;Understanding of Exercise Prescription;Improve claudication pain tolerance and improve walking ability  Increase Physical Activity;Increase Strength and Stamina;Able to understand and use rate of perceived exertion (RPE) scale;Knowledge and understanding of Target Heart Rate Range (THRR);Able to check pulse independently;Understanding of Exercise Prescription;Improve claudication pain tolerance and improve walking ability     Comments  Pt. has done well in the program so far, she has attended 9 sessions. She has tolerated the exercise and progressions well and is eager to come in, work hard and push herself.   Pt. continues to do well in [place]. She always comes to class ready to work and pushes herself on both pieces of equipment in order to increase her distance and overall MET level.  Pt. is doing great in [place]. She pushes herself hard every session to get better and get farther on the equipment than she did before. She has had some set backs with headaches but hasn't let that stop her from exercising.      Expected Outcomes  Improve walking, walk without pain, lose weight.   Improve walking, walk without pain, lose weight.   Improve walking, walk without pain, lose weight.          Discharge Exercise Prescription (Final Exercise Prescription Changes): Exercise Prescription Changes - 08/27/18 1200      Response to Exercise   Blood Pressure (Admit)  136/64    Blood Pressure (Exercise)  148/74    Blood Pressure (Exit)  98/50    Heart Rate (Admit)  87 bpm     Heart Rate (Exercise)  112 bpm    Heart Rate (Exit)  94 bpm    Rating of Perceived Exertion (Exercise)  11    Duration  Continue with 30 min of aerobic exercise without signs/symptoms of physical distress.    Intensity  THRR unchanged      Progression   Progression  Continue to progress workloads to maintain intensity without signs/symptoms of physical distress.    Average METs  3.25      Resistance Training   Training Prescription  Yes    Weight  4    Reps  10-15      NuStep   Level  3    SPM  121    Minutes  22    METs  2.4      Arm Ergometer   Level  3    Watts  44    RPM  74    Minutes  17    METs  4.1      Home Exercise Plan   Plans to continue exercise at  Home (comment)    Frequency  Add 2 additional days to program exercise sessions.    Initial Home Exercises Provided  06/17/18       Nutrition:  Target Goals: Understanding of nutrition guidelines, daily intake of sodium <1553m, cholesterol <2011m calories 30% from fat and 7% or less from saturated fats, daily to have 5 or more servings of fruits and vegetables.  Biometrics: Pre Biometrics - 06/17/18 1416      Pre Biometrics   Height  _0  (1.727 m)    Waist Circumference  44 inches    Hip Circumference  54 inches    Waist to Hip Ratio  0.81 %    Triceps Skinfold  23 mm    % Body Fat  46 %    Grip Strength  17.5 kg    Flexibility  0 in    Single Leg Stand  1.5 seconds        Nutrition Therapy Plan and Nutrition Goals: Nutrition Therapy & Goals - 09/05/18 0940      Nutrition Therapy   RD appointment deferred  Yes      Personal Nutrition Goals   Comments  Patient was reminded of RD appointment tomorrow. She continues to says she is following a diabetic diet. Will continue to monitor for progress.       Intervention Plan   Intervention  Nutrition handout(s) given to patient.       Nutrition Assessments: Nutrition Assessments - 06/17/18 1529      MEDFICTS Scores   Pre Score  6        Nutrition  Goals Re-Evaluation:   Nutrition Goals Discharge (Final Nutrition Goals Re-Evaluation):   Psychosocial: Target Goals: Acknowledge presence or absence of significant depression and/or stress, maximize coping skills, provide positive support system. Participant is able to verbalize types and ability to use techniques and skills needed for reducing stress and depression.  Initial Review & Psychosocial Screening: Initial Psych Review & Screening - 06/17/18 1521      Initial Review   Current issues with  None Identified      Family Dynamics   Good Support System?  Yes      Barriers   Psychosocial barriers to participate in program  There are no identifiable barriers or psychosocial needs.      Screening Interventions   Interventions  Encouraged to exercise    Expected Outcomes  Short Term goal: Identification and review with participant of any Quality of Life or Depression concerns found by scoring the questionnaire.;Long Term goal: The participant improves quality of Life and PHQ9 Scores as seen by post scores and/or verbalization of changes       Quality of Life Scores: Quality of Life - 06/17/18 1420      Quality of Life   Select  Quality of Life      Quality of Life Scores   Health/Function Pre  24.41 %    Socioeconomic Pre  20.36 %    Psych/Spiritual Pre  26.43 %    Family Pre  30 %    GLOBAL Pre  24.08 %      Scores of 19 and below usually indicate a poorer quality of life in these areas.  A difference of  2-3 points is a clinically meaningful difference.  A difference of 2-3 points in the total score of the Quality of Life Index has been associated with significant improvement in overall quality of life, self-image, physical symptoms, and general health in studies assessing change in quality of life.  PHQ-9: Recent Review Flowsheet Data    Depression screen [facility] 2/9 06/17/2018 04/09/2017 08/04/2015 04/30/2015 05/18/2014   Decreased Interest 0 0 0 0 0    Down, Depressed, Hopeless 0 0 0 0 0   PHQ - 2 Score 0 0 0 0 0   Altered sleeping 0 - - - -   Tired, decreased energy 0 - - - -   Change in appetite 0 - - - -   Feeling bad or failure about yourself  0 - - - -   Trouble concentrating 0 - - - -   Moving slowly or fidgety/restless 0 - - - -   Suicidal thoughts 0 - - - -   PHQ-9 Score 0 - - - -   Difficult doing work/chores Not difficult at all - - - -     Interpretation of Total Score  Total Score Depression Severity:  1-4 = Minimal depression, 5-9 = Mild depression, 10-14 = Moderate depression, 15-19 = Moderately severe depression, 20-27 = Severe depression   Psychosocial Evaluation and Intervention: Psychosocial Evaluation - 06/17/18 1523      Psychosocial Evaluation & Interventions   Interventions  Encouraged to exercise with the program and follow exercise prescription    Continue Psychosocial Services   No Follow up required       Psychosocial Re-Evaluation: Psychosocial Re-Evaluation    Row Name 07/18/18 9702 08/08/18 1016 09/05/18 0949         Psychosocial Re-Evaluation   Current issues with  None Identified  None Identified  None Identified  Comments  Patient's initial QOL score was 24.08 and her PHQ-9 score was 0 with no psychosocial issues identified.   Patient's initial QOL score was 24.08 and her PHQ-9 score was 0 with no psychosocial issues identified.   Patient's initial QOL score was 24.08 and her PHQ-9 score was 0 with no psychosocial issues identified.      Expected Outcomes  Patient will have no psychosocial issues identified at discharge.   Patient will have no psychosocial issues identified at discharge.   Patient will have no psychosocial issues identified at discharge.      Interventions  Stress management education;Encouraged to attend Cardiac Rehabilitation for the exercise;Relaxation education  Stress management education;Encouraged to attend Cardiac Rehabilitation for the exercise;Relaxation education   Stress management education;Encouraged to attend Cardiac Rehabilitation for the exercise;Relaxation education     Continue Psychosocial Services   No Follow up required  No Follow up required  No Follow up required        Psychosocial Discharge (Final Psychosocial Re-Evaluation): Psychosocial Re-Evaluation - 09/05/18 0949      Psychosocial Re-Evaluation   Current issues with  None Identified    Comments  Patient's initial QOL score was 24.08 and her PHQ-9 score was 0 with no psychosocial issues identified.     Expected Outcomes  Patient will have no psychosocial issues identified at discharge.     Interventions  Stress management education;Encouraged to attend Cardiac Rehabilitation for the exercise;Relaxation education    Continue Psychosocial Services   No Follow up required       Vocational Rehabilitation: Provide vocational rehab assistance to qualifying candidates.   Vocational Rehab Evaluation & Intervention: Vocational Rehab - 06/17/18 1530      Initial Vocational Rehab Evaluation & Intervention   Assessment shows need for Vocational Rehabilitation  No       Education: Education Goals: Education classes will be provided on a weekly basis, covering required topics. Participant will state understanding/return demonstration of topics presented.  Learning Barriers/Preferences: Learning Barriers/Preferences - 06/17/18 1529      Learning Barriers/Preferences   Learning Barriers  None    Learning Preferences  Audio;Computer/Internet;Group Instruction;Individual Instruction;Pictoral;Skilled Demonstration;Verbal Instruction;Video;Written Material       Education Topics: Hypertension, Hypertension Reduction -Define heart disease and high blood pressure. Discus how high blood pressure affects the body and ways to reduce high blood pressure.   CARDIAC REHAB PHASE II EXERCISE from 09/04/2018 in [place]  Date  09/04/18  Educator  DJ  Instruction Review  Code  2- Demonstrated Understanding      Exercise and Your Heart -Discuss why it is important to exercise, the FITT principles of exercise, normal and abnormal responses to exercise, and how to exercise safely.   Angina -Discuss definition of angina, causes of angina, treatment of angina, and how to decrease risk of having angina.   Cardiac Medications -Review what the following cardiac medications are used for, how they affect the body, and side effects that may occur when taking the medications.  Medications include Aspirin, Beta blockers, calcium channel blockers, ACE Inhibitors, angiotensin receptor blockers, diuretics, digoxin, and antihyperlipidemics.   CARDIAC REHAB PHASE II EXERCISE from 09/04/2018 in [place]  Date  06/26/18  Educator  Etheleen Mayhew  Instruction Review Code  2- Demonstrated Understanding      Congestive Heart Failure -Discuss the definition of CHF, how to live with CHF, the signs and symptoms of CHF, and how keep track of weight and sodium intake.  CARDIAC REHAB PHASE II EXERCISE from 09/04/2018 in [place]  Date  07/03/18  Educator  Wynetta Emery  Instruction Review Code  2- Demonstrated Understanding      Heart Disease and Intimacy -Discus the effect sexual activity has on the heart, how changes occur during intimacy as we age, and safety during sexual activity.   CARDIAC REHAB PHASE II EXERCISE from 09/04/2018 in [place]  Date  07/10/18  Educator  Etheleen Mayhew  Instruction Review Code  2- Demonstrated Understanding      Smoking Cessation / COPD -Discuss different methods to quit smoking, the health benefits of quitting smoking, and the definition of COPD.   CARDIAC REHAB PHASE II EXERCISE from 09/04/2018 in [place]  Date  07/17/18  Educator  Etheleen Mayhew   Instruction Review Code  2- Demonstrated Understanding      Nutrition I: Fats -Discuss the types of  cholesterol, what cholesterol does to the heart, and how cholesterol levels can be controlled.   Nutrition II: Labels -Discuss the different components of food labels and how to read food label   CARDIAC REHAB PHASE II EXERCISE from 09/04/2018 in [place]  Date  08/07/18  Educator  DC  Instruction Review Code  2- Demonstrated Understanding      Heart Parts/Heart Disease and PAD -Discuss the anatomy of the heart, the pathway of blood circulation through the heart, and these are affected by heart disease.   CARDIAC REHAB PHASE II EXERCISE from 09/04/2018 in [place]  Date  08/02/18  Educator  Wynetta Emery  Instruction Review Code  2- Demonstrated Understanding      Stress I: Signs and Symptoms -Discuss the causes of stress, how stress may lead to anxiety and depression, and ways to limit stress.   CARDIAC REHAB PHASE II EXERCISE from 09/04/2018 in [place]  Date  08/14/18  Educator  DJ  Instruction Review Code  2- Demonstrated Understanding      Stress II: Relaxation -Discuss different types of relaxation techniques to limit stress.   Warning Signs of Stroke / TIA -Discuss definition of a stroke, what the signs and symptoms are of a stroke, and how to identify when someone is having stroke.   CARDIAC REHAB PHASE II EXERCISE from 09/04/2018 in [place]  Date  08/28/18  Educator  DJ  Instruction Review Code  2- Demonstrated Understanding      Knowledge Questionnaire Score: Knowledge Questionnaire Score - 06/17/18 1530      Knowledge Questionnaire Score   Pre Score  23/24       Core Components/Risk Factors/Patient Goals at Admission: Personal Goals and Risk Factors at Admission - 06/17/18 1530      Core Components/Risk Factors/Patient Goals on Admission    Weight Management  Yes    Intervention  Weight Management/Obesity: Establish reasonable short term and long term weight goals.     Admit Weight  261 lb (118.4 kg)    Goal Weight: Short Term  246 lb (111.6 kg)    Goal Weight: Long Term  231 lb (104.8 kg)    Expected Outcomes  Short Term: Continue to assess and modify interventions until short term weight is achieved;Long Term: Adherence to nutrition and physical activity/exercise program aimed toward attainment of established weight goal    Personal Goal Other  Yes    Personal Goal  Be able to walk further and lose 30lbs    Intervention  Attend program 3 x week and supplement with 2 days week at home stretching and hand heald weights.    Expected Outcomes  Reach personal goals.        Core Components/Risk Factors/Patient Goals Review:  Goals and Risk Factor Review    Row Name 07/18/18 0827 08/08/18 1013 09/05/18 0941         Core Components/Risk Factors/Patient Goals Review   Personal Goals Review  Weight Management/Obesity;Diabetes Walk further; lose 30 lbs.   Weight Management/Obesity;Diabetes Walk further; lose 30 lbs.   Weight Management/Obesity;Diabetes Walk further; lose 30 lbs.      Review  Patient has completed 10 sessions maintaining her weight since her initial visit. She is doing well in the program with progression. She was seen in the ED 07/16/18 with a near syncope episode transported by EMS. She was hypotensive and later evaluated by her cardiologist. He adjusted her medication. Her b/p have been stable. Her last A1C on file was 7.8 on 03/20/18. Her fasting reported glucose readings are usually below 120. Will continue to monitor for progress.   Patient has completed 16 sessions losing 10 lbs since last 30 day review. She continues to do well in the program with progression. Her last report A1C was 05/2018 at 7.7 mg/dl. Her reported fasting glucose readings average 115 mg/dl. She is on an insulin pump. She says she has a lot more energy and is able to walk further and is doing more around the house. Will continue to monitor for progress.   Patient has  completed 27 sessions losing 1.5 lb since last 30 day review. She continues to do well in the program with progression. She reports her last A1C was 7.2 mg/dl on 06/2018. Her fasting glucose readings she reports are usually >120. She states her energy level has increased and she is able to do more around the house. She continues to suffer from migraine headaches and has seen a neurologist for this. He prescribed Lodocaine 1 spray each nostril prn and increased her Trokendi XR to 200 mg daily. but this has not helped the headaches. He reommended starting O2 if the headaches are not controlled. Will continue to monitor for progress.      Expected Outcomes  Patient will continue to attend sessions and complete the program meeting her personal goals.   Patient will continue to attend sessions and complete the program meeting her personal goals.   Patient will continue to attend sessions and complete the program meeting her personal goals.         Core Components/Risk Factors/Patient Goals at Discharge (Final Review):  Goals and Risk Factor Review - 09/05/18 0941      Core Components/Risk Factors/Patient Goals Review   Personal Goals Review  Weight Management/Obesity;Diabetes   Walk further; lose 30 lbs.    Review  Patient has completed 27 sessions losing 1.5 lb since last 30 day review. She continues to do well in the program with progression. She reports her last A1C was 7.2 mg/dl on 06/2018. Her fasting glucose readings she reports are usually >120. She states her energy level has increased and she is able to do more around the house. She continues to suffer from migraine headaches and has seen a neurologist for this. He prescribed Lodocaine 1 spray each nostril prn and increased her Trokendi XR to 200 mg daily. but this has not helped the headaches. He reommended starting O2 if the headaches are not controlled. Will continue to monitor for progress.  Expected Outcomes  Patient will continue to attend  sessions and complete the program meeting her personal goals.        ITP Comments: ITP Comments    Row Name 07/01/18 1012           ITP Comments  Patient new to program. She has completed 4 sessions. Will continue to monitor for progress.           Comments: ITP REVIEW Patient doing well in the program. Will continue to monitor for progress.

## 2018-09-06 ENCOUNTER — Encounter (HOSPITAL_COMMUNITY)
Admission: RE | Admit: 2018-09-06 | Discharge: 2018-09-06 | Disposition: A | Discharge status: Home / Self Care | Payer: Medicare Other | Source: Ambulatory Visit | Attending: Cardiovascular Disease | Admitting: Cardiovascular Disease

## 2018-09-06 DIAGNOSIS — Z955 Presence of coronary angioplasty implant and graft: Secondary | ICD-10-CM | POA: Diagnosis not present

## 2018-09-06 NOTE — Progress Notes (Signed)
Daily Session Note  Patient Details  Name: Jodi Ray MRN: [id number] Date of Birth: October 25, 1960 Referring Provider:     CARDIAC REHAB PHASE II EXERCISE from 06/17/2018 in [place]  Referring Provider  Bronson Ing      Encounter Date: 09/06/2018  Check In: Session Check In - 09/06/18 0815      Check-In   Supervising physician immediately available to respond to emergencies  See telemetry face sheet for immediately available MD    Location  [facility]    Staff Present  Benay Pike, Exercise Physiologist;Diane Coad, MS, EP, [facility], Exercise Physiologist;Debra Wynetta Emery, RN, BSN    Medication changes reported      No    Fall or balance concerns reported     No    Tobacco Cessation  No Change    Warm-up and Cool-down  Performed as group-led instruction    Resistance Training Performed  Yes    VAD Patient?  No    PAD/SET Patient?  No      Pain Assessment   Currently in Pain?  Yes    Pain Score  3     Pain Location  Head    Pain Orientation  Right    Pain Descriptors / Indicators  Aching    Pain Type  Chronic pain    Pain Onset  More than a month ago    Pain Frequency  Intermittent    Multiple Pain Sites  No       Capillary Blood Glucose: No results found for this or any previous visit (from the past 24 hour(s)).    Social History   Tobacco Use  Smoking Status Never Smoker  Smokeless Tobacco Never Used    Goals Met:  Proper associated with RPD/PD & O2 Sat Independence with exercise equipment Exercise tolerated well No report of cardiac concerns or symptoms Strength training completed today  Goals Unmet:  Not Applicable  Comments: Pt able to follow exercise prescription today without complaint.  Will continue to monitor for progression. Check out 0915.   Dr. Kate Sable is Medical Director for [facility] Cardiac and Pulmonary Rehab.

## 2018-09-09 ENCOUNTER — Encounter (HOSPITAL_COMMUNITY)
Admission: RE | Admit: 2018-09-09 | Discharge: 2018-09-09 | Disposition: A | Discharge status: Home / Self Care | Payer: Medicare Other | Source: Ambulatory Visit | Attending: Cardiovascular Disease | Admitting: Cardiovascular Disease

## 2018-09-09 DIAGNOSIS — I10 Essential (primary) hypertension: Secondary | ICD-10-CM | POA: Diagnosis not present

## 2018-09-09 DIAGNOSIS — E1165 Type 2 diabetes mellitus with hyperglycemia: Secondary | ICD-10-CM | POA: Diagnosis not present

## 2018-09-09 DIAGNOSIS — R51 Headache: Secondary | ICD-10-CM | POA: Diagnosis not present

## 2018-09-09 DIAGNOSIS — Z955 Presence of coronary angioplasty implant and graft: Secondary | ICD-10-CM | POA: Diagnosis not present

## 2018-09-09 DIAGNOSIS — Z299 Encounter for prophylactic measures, unspecified: Secondary | ICD-10-CM | POA: Diagnosis not present

## 2018-09-09 DIAGNOSIS — E1122 Type 2 diabetes mellitus with diabetic chronic kidney disease: Secondary | ICD-10-CM | POA: Diagnosis not present

## 2018-09-09 DIAGNOSIS — E1142 Type 2 diabetes mellitus with diabetic polyneuropathy: Secondary | ICD-10-CM | POA: Diagnosis not present

## 2018-09-09 DIAGNOSIS — Z6839 Body mass index (BMI) 39.0-39.9, adult: Secondary | ICD-10-CM | POA: Diagnosis not present

## 2018-09-09 NOTE — Progress Notes (Signed)
Daily Session Note  Patient Details  Name: Jodi Ray MRN: [id number] Date of Birth: 05-09-1961 Referring Provider:     [place] from 06/17/2018 in [place]  Referring Provider  Bronson Ing      Encounter Date: 09/09/2018  Check In:   Capillary Blood Glucose: No results found for this or any previous visit (from the past 24 hour(s)).    Social History   Tobacco Use  Smoking Status Never Smoker  Smokeless Tobacco Never Used    Goals Met:  Proper associated with RPD/PD & O2 Sat Independence with exercise equipment Exercise tolerated well No report of cardiac concerns or symptoms Strength training completed today  Goals Unmet:  Not Applicable  Comments: Pt able to follow exercise prescription today without complaint.  Will continue to monitor for progression. Check out 0915.   Dr. Kate Sable is Medical Director for [facility] Cardiac and Pulmonary Rehab.

## 2018-09-11 ENCOUNTER — Encounter (HOSPITAL_COMMUNITY)
Admission: RE | Admit: 2018-09-11 | Discharge: 2018-09-11 | Disposition: A | Discharge status: Home / Self Care | Payer: Medicare Other | Source: Ambulatory Visit | Attending: Cardiovascular Disease | Admitting: Cardiovascular Disease

## 2018-09-11 DIAGNOSIS — J3081 Allergic rhinitis due to animal (cat) (dog) hair and dander: Secondary | ICD-10-CM | POA: Diagnosis not present

## 2018-09-11 DIAGNOSIS — Z955 Presence of coronary angioplasty implant and graft: Secondary | ICD-10-CM | POA: Diagnosis not present

## 2018-09-11 DIAGNOSIS — J301 Allergic rhinitis due to pollen: Secondary | ICD-10-CM | POA: Diagnosis not present

## 2018-09-11 DIAGNOSIS — J3089 Other allergic rhinitis: Secondary | ICD-10-CM | POA: Diagnosis not present

## 2018-09-11 NOTE — Progress Notes (Addendum)
Daily Session Note  Patient Details  Name: Jodi Ray MRN: [id number] Date of Birth: 12/18/1960 Referring Provider:     CARDIAC REHAB PHASE II EXERCISE from 06/17/2018 in [place]  Referring Provider  Bronson Ing      Encounter Date: 09/11/2018  Check In: Session Check In - 09/11/18 0815      Check-In   Supervising physician immediately available to respond to emergencies  See telemetry face sheet for immediately available MD    Location  [facility]    Staff Present  Jodi Ray, Exercise Physiologist;Jodi Coad, MS, EP, [facility], Exercise Physiologist;Jodi Prestigiacomo Wynetta Emery, RN, BSN    Medication changes reported      No    Fall or balance concerns reported     No    Warm-up and Cool-down  Performed as group-led instruction    Resistance Training Performed  Yes    VAD Patient?  No    PAD/SET Patient?  No      Pain Assessment   Currently in Pain?  Yes    Pain Score  6     Pain Location  Head    Pain Orientation  Medial    Pain Descriptors / Indicators  Throbbing    Pain Type  Chronic pain    Pain Radiating Towards  No radiation    Pain Onset  Yesterday    Pain Frequency  Intermittent    Aggravating Factors   Light/activity.    Pain Relieving Factors  Rest, Lidocaine nasal spray.    Effect of Pain on Daily Activities  Limits activity if severe.     Multiple Pain Sites  No       Capillary Blood Glucose: No results found for this or any previous visit (from the past 24 hour(s)).    Social History   Tobacco Use  Smoking Status Never Smoker  Smokeless Tobacco Never Used    Goals Met:  Independence with exercise equipment Exercise tolerated well No report of cardiac concerns or symptoms Strength training completed today  Goals Unmet:  Not Applicable  Comments: Pt able to follow exercise prescription today without complaint.  Will continue to monitor for progression. Check out 915.   Dr. Kate Sable is Medical  Director for [facility] Cardiac and Pulmonary Rehab.

## 2018-09-13 ENCOUNTER — Encounter: Payer: Self-pay | Admitting: Cardiovascular Disease

## 2018-09-13 ENCOUNTER — Ambulatory Visit (INDEPENDENT_AMBULATORY_CARE_PROVIDER_SITE_OTHER): Payer: Medicare Other | Admitting: Cardiovascular Disease

## 2018-09-13 ENCOUNTER — Encounter (HOSPITAL_COMMUNITY)
Admission: RE | Admit: 2018-09-13 | Discharge: 2018-09-13 | Disposition: A | Discharge status: Home / Self Care | Payer: Medicare Other | Source: Ambulatory Visit | Attending: Cardiovascular Disease | Admitting: Cardiovascular Disease

## 2018-09-13 VITALS — BP 122/56 | HR 88 | Ht 68.0 in | Wt 258.0 lb

## 2018-09-13 DIAGNOSIS — E119 Type 2 diabetes mellitus without complications: Secondary | ICD-10-CM

## 2018-09-13 DIAGNOSIS — Z794 Long term (current) use of insulin: Secondary | ICD-10-CM

## 2018-09-13 DIAGNOSIS — E785 Hyperlipidemia, unspecified: Secondary | ICD-10-CM | POA: Diagnosis not present

## 2018-09-13 DIAGNOSIS — I25118 Atherosclerotic heart disease of native coronary artery with other forms of angina pectoris: Secondary | ICD-10-CM

## 2018-09-13 DIAGNOSIS — I10 Essential (primary) hypertension: Secondary | ICD-10-CM | POA: Diagnosis not present

## 2018-09-13 DIAGNOSIS — IMO0001 Reserved for inherently not codable concepts without codable children: Secondary | ICD-10-CM

## 2018-09-13 DIAGNOSIS — Z955 Presence of coronary angioplasty implant and graft: Secondary | ICD-10-CM | POA: Diagnosis not present

## 2018-09-13 NOTE — Progress Notes (Signed)
Daily Session Note  Patient Details  Name: Jodi Ray MRN: [id number] Date of Birth: 06-02-1961 Referring Provider:     CARDIAC REHAB PHASE II EXERCISE from 06/17/2018 in [place]  Referring Provider  Bronson Ing      Encounter Date: 09/13/2018  Check In: Session Check In - 09/13/18 0815      Check-In   Supervising physician immediately available to respond to emergencies  See telemetry face sheet for immediately available MD    Location  [facility]    Staff Present  Benay Pike, Exercise Physiologist;Diane Coad, MS, EP, [facility], Exercise Physiologist;Anjelo Pullman Wynetta Emery, RN, BSN    Medication changes reported      No    Fall or balance concerns reported     No    Warm-up and Cool-down  Performed as group-led instruction    Resistance Training Performed  Yes    VAD Patient?  No    PAD/SET Patient?  No      Pain Assessment   Currently in Pain?  No/denies    Pain Score  0-No pain    Multiple Pain Sites  No       Capillary Blood Glucose: No results found for this or any previous visit (from the past 24 hour(s)).    Social History   Tobacco Use  Smoking Status Never Smoker  Smokeless Tobacco Never Used    Goals Met:  Independence with exercise equipment Exercise tolerated well No report of cardiac concerns or symptoms Strength training completed today  Goals Unmet:  Not Applicable  Comments: Pt able to follow exercise prescription today without complaint.  Will continue to monitor for progression. Check out 915.   Dr. Kate Sable is Medical Director for [facility] Cardiac and Pulmonary Rehab.

## 2018-09-13 NOTE — Patient Instructions (Signed)
Medication Instructions: No change today  Labwork: None today  Procedures/Testing: None today  Follow-Up: 6 months with Dr.Koneswaran  Any Additional Special Instructions Will Be Listed Below (If Applicable).     If you need a refill on your cardiac medications before your next appointment, please call your pharmacy.

## 2018-09-13 NOTE — Progress Notes (Signed)
SUBJECTIVE: The patient presents for routine follow-up for coronary artery disease. she underwent coronary angiography on 03/22/2018 which showed 80% Proximal-LAD stenosis with 20% Proximal-RCA stenosis. SuccessfulPTCA/DES x 1was performed to theproximal LADand she was started on DAPT withAspirin 81mg  daily and Clopidogrel 75mg  daily.   Echocardiogram on 07/17/2018 demonstrated normal left ventricular systolic function and regional wall motion, LVEF 60 to 65%, indeterminate grade diastolic dysfunction, moderate LVH, and mild left atrial dilatation.  She has been carefully watching her diet.  She participates in cardiac rehabilitation and also exercises at [employer] 3 days/week.  She has to take Lasix once or at a maximum twice per week.  Her nephrologist resumed lisinopril 10 mg daily.  She has not had to use nitroglycerin.    Social history:She is originally from [place]. She used to be a Forensic psychologist. She knows [place] very well.  Review of Systems: As per "subjective", otherwise negative.  Allergies  Allergen Reactions  . Fentanyl Nausea And Vomiting and Other (See Comments)    Nausea, vomiting, loss of consciousness, requiring reversal    Current Outpatient Medications  Medication Sig Dispense Refill  . aspirin EC 81 MG tablet Take 81 mg by mouth daily.    Marland Kitchen atorvastatin (LIPITOR) 80 MG tablet Take 1 tablet (80 mg total) by mouth daily at 6 PM. 90 tablet 3  . carvedilol (COREG) 3.125 MG tablet Take 1 tablet (3.125 mg total) by mouth 2 (two) times daily. 180 tablet 3  . Cholecalciferol (VITAMIN D-3) 5000 units TABS Take 5,000 Units by mouth daily.     . clopidogrel (PLAVIX) 75 MG tablet Take 1 tablet (75 mg total) by mouth daily with breakfast. 90 tablet 3  . EPINEPHrine 0.3 mg/0.3 mL IJ SOAJ injection Inject 0.3 mg into the muscle as needed.  1  . Fluocinolone Acetonide (DERMOTIC) 0.01 % OIL Place 1 drop into both ears at bedtime.     .  furosemide (LASIX) 20 MG tablet Take 1 tablet (20 mg total) by mouth as needed. 30 tablet 3  . gabapentin (NEURONTIN) 400 MG capsule Take 400 mg by mouth at bedtime.    . gabapentin (NEURONTIN) 800 MG tablet Take 800 mg by mouth at bedtime.    . Insulin Human (INSULIN PUMP) SOLN Inject into the skin continuous.     Marland Kitchen levocetirizine (XYZAL) 5 MG tablet Take 5 mg by mouth daily with breakfast.    . levothyroxine (SYNTHROID, LEVOTHROID) 125 MCG tablet Take 125 mcg by mouth daily before breakfast.   0  . metFORMIN (GLUCOPHAGE) 500 MG tablet Take 1 tablet (500 mg total) by mouth 2 (two) times daily with a meal. Do not take 03/23/2018 and 03/24/2018. Restart taking from 03/25/2018. (Patient taking differently: Take 500 mg by mouth 2 (two) times daily with a meal. )    . Multiple Vitamins-Minerals (CENTRUM SILVER 50+WOMEN) TABS Take 1 tablet by mouth daily.     . nitroGLYCERIN (NITROSTAT) 0.4 MG SL tablet Place 1 tablet (0.4 mg total) under the tongue every 5 (five) minutes as needed for chest pain. 25 tablet 3  . NOVOLOG 100 UNIT/ML injection Inject into the skin continuous.   0  . Omega-3 Fatty Acids (FISH OIL) 1000 MG CAPS Take 1,000 mg by mouth daily.    . pantoprazole (PROTONIX) 40 MG tablet Take 1 tablet (40 mg total) by mouth 2 (two) times daily before a meal. 60 tablet 0  . Polyethylene Glycol 400 (BLINK TEARS) 0.25 %  SOLN Apply 1 drop to eye daily as needed (for eye irritation/dryness).    . predniSONE (STERAPRED UNI-PAK 21 TAB) 10 MG (21) TBPK tablet As directed 21 tablet 0  . tizanidine (ZANAFLEX) 2 MG capsule Take 1 capsule (2 mg total) by mouth at bedtime. 30 capsule 1  . topiramate (TOPAMAX) 200 MG tablet Take 1 tablet (200 mg total) by mouth daily. 30 tablet 5   No current facility-administered medications for this visit.     Past Medical History:  Diagnosis Date  . 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 Anemia   . Brain aneurysm    frontal lobe  . CAD (coronary artery disease)    a. 02/2018: s/p DES to  Proximal LAD with residual 20% RCA stenosis.  . Chronic pain   . Diabetes mellitus without complication ([place])   . Headache   . History of left bundle branch block (LBBB)   . HTN (hypertension)   . Hx of cardiovascular stress test 10/2016   intermediate risk study  . Hypothyroidism   . Neuromuscular disorder ([place])   . Neuropathy   . Obesity   . OSA on CPAP 03/10/2015  . Retinopathy   . Sleep apnea   . Varicose veins of both lower extremities     Past Surgical History:  Procedure Laterality Date  . BIOPSY  11/14/2016   Procedure: BIOPSY;  Surgeon: Danie Binder, MD;  Location: [facility];  Service: Endoscopy;;  colon gastric duodenal  . BREAST SURGERY Left 1994   Lumpectomy  . COLONOSCOPY WITH PROPOFOL N/A 11/14/2016   Dr. Oneida Alar: Moderately redundant rectosigmoid colon. Random colon biopsies benign. Internal hemorrhoids. Surveillance colonoscopy in 5 years.  . CORONARY STENT INTERVENTION N/A 03/22/2018   Procedure: CORONARY STENT INTERVENTION;  Surgeon: Burnell Blanks, MD;  Location: [place] [facility];  Service: Cardiovascular;  Laterality: N/A;  . ESOPHAGOGASTRODUODENOSCOPY (EGD) WITH PROPOFOL N/A 11/14/2016   Dr. Oneida Alar: Esophagus normal. Moderate gastritis, few gastric polyps. Duodenal biopsies negative. Fundic gland polyp gastric polyp area no H pylori.  Marland Kitchen FOOT SURGERY    . LEFT HEART CATH AND CORONARY ANGIOGRAPHY N/A 03/22/2018   Procedure: LEFT HEART CATH AND CORONARY ANGIOGRAPHY;  Surgeon: Burnell Blanks, MD;  Location: [place] [facility];  Service: Cardiovascular;  Laterality: N/A;  . LEG SURGERY Right   . REPLACEMENT TOTAL KNEE Right   . SPINAL CORD STIMULATOR IMPLANT      Social History   Socioeconomic History  . Marital status: Widowed    Spouse name: Not on file  . Number of children: Not on file  . Years of education: Not on file  . Highest education level: Not on file  Occupational History  . Occupation: Retired  Scientific laboratory technician  .  Financial resource strain: Not on file  . Food insecurity:    Worry: Not on file    Inability: Not on file  . Transportation needs:    Medical: Not on file    Non-medical: Not on file  Tobacco Use  . Smoking status: Never Smoker  . Smokeless tobacco: Never Used  Substance and Sexual Activity  . Alcohol use: No    Alcohol/week: 0.0 standard drinks  . Drug use: No  . Sexual activity: Yes    Partners: Male  Lifestyle  . Physical activity:    Days per week: Not on file    Minutes per session: Not on file  . Stress: Not on file  Relationships  . Social connections:  Talks on phone: Not on file    Gets together: Not on file    Attends religious service: Not on file    Active member of club or organization: Not on file    Attends meetings of clubs or organizations: Not on file    Relationship status: Not on file  . Intimate partner violence:    Fear of current or ex partner: Not on file    Emotionally abused: Not on file    Physically abused: Not on file    Forced sexual activity: Not on file  Other Topics Concern  . Not on file  Social History Narrative   Drinks about 1-2 cups of caffeine daily.     Vitals:   09/13/18 0943  BP: (!) 122/56  Pulse: 88  SpO2: 97%  Weight: 258 lb (117 kg)  Height: 5\' 8"  (1.727 m)    Wt Readings from Last 3 Encounters:  09/13/18 258 lb (117 kg)  08/22/18 250 lb (113.4 kg)  08/11/18 261 lb 0.4 oz (118.4 kg)     PHYSICAL EXAM General: NAD HEENT: Normal. Neck: No JVD, no thyromegaly. Lungs: Clear to auscultation bilaterally with normal respiratory effort. CV: Regular rate and rhythm, normal S1/S2, no F1/T0, soft systolic murmur over left upper sternal border.  Trace bilateral lower extremity edema (wearing compression stockings).     Abdomen: Soft, nontender, no distention.  Neurologic: Alert and oriented.  Psych: Normal affect. Skin: Normal. Musculoskeletal: No gross deformities.    ECG: Reviewed above under  Subjective   Labs: Lab Results  Component Value Date/Time   K 3.7 08/26/2018 09:36 AM   BUN 19 08/26/2018 09:36 AM   CREATININE 1.01 (H) 08/26/2018 09:36 AM   CREATININE 0.98 07/23/2015 07:07 AM   ALT 61 (H) 07/06/2018 11:11 AM   TSH 1.990 07/23/2015 07:07 AM   HGB 11.5 (L) 08/26/2018 09:37 AM     Lipids: Lab Results  Component Value Date/Time   LDLCALC 54 04/16/2018 08:15 AM   CHOL 134 04/16/2018 08:15 AM   TRIG 23 04/16/2018 08:15 AM   HDL 75 04/16/2018 08:15 AM       ASSESSMENT AND PLAN: 1.  Coronary artery disease: Status post drug-eluting stent placement to the proximal LAD in August 2019.  Left ventricular systolic function is normal, EF 60 to 65% by echocardiogram in December 2019.    Symptomatically stable.  Continue dual antiplatelet therapy with aspirin Plavix (recommendation by interventional cardiology is to complete 1 year of aspirin and Plavix if she tolerates it well) along with atorvastatin and carvedilol.  I congratulated her on her physical activity regimen.  2.  Hypertension: BP is normal.  Continue carvedilol and lisinopril 10 mg daily.    3.  Hyperlipidemia: LDL normal at 54 on 04/16/2018 with full lipid panel reviewed above.  Continue atorvastatin.  4.  Obstructive sleep apnea: Continue CPAP.  5.  Insulin-dependent diabetes mellitus: Followed by endocrinology.  She is on lisinopril and atorvastatin.    Disposition: Follow up 6 months   Kate Sable, M.D., F.A.C.C.

## 2018-09-16 ENCOUNTER — Encounter (HOSPITAL_COMMUNITY)
Admission: RE | Admit: 2018-09-16 | Discharge: 2018-09-16 | Disposition: A | Discharge status: Home / Self Care | Payer: Medicare Other | Source: Ambulatory Visit | Attending: Cardiovascular Disease | Admitting: Cardiovascular Disease

## 2018-09-16 DIAGNOSIS — J3081 Allergic rhinitis due to animal (cat) (dog) hair and dander: Secondary | ICD-10-CM | POA: Diagnosis not present

## 2018-09-16 DIAGNOSIS — Z955 Presence of coronary angioplasty implant and graft: Secondary | ICD-10-CM | POA: Diagnosis not present

## 2018-09-16 DIAGNOSIS — J3089 Other allergic rhinitis: Secondary | ICD-10-CM | POA: Diagnosis not present

## 2018-09-16 DIAGNOSIS — J301 Allergic rhinitis due to pollen: Secondary | ICD-10-CM | POA: Diagnosis not present

## 2018-09-16 NOTE — Progress Notes (Signed)
Daily Session Note  Patient Details  Name: Jodi Ray MRN: [id number] Date of Birth: [birth year] Referring Provider:     CARDIAC REHAB PHASE II EXERCISE from 06/17/2018 in [place]  Referring Provider  Bronson Ing      Encounter Date: 09/16/2018  Check In: Session Check In - 09/16/18 0815      Check-In   Supervising physician immediately available to respond to emergencies  See telemetry face sheet for immediately available MD    Location  [facility]    Staff Present  Benay Pike, Exercise Physiologist;Diane Coad, MS, EP, [facility], Exercise Physiologist;Debra Wynetta Emery, RN, BSN    Medication changes reported      Yes    Fall or balance concerns reported     No    Tobacco Cessation  No Change    Warm-up and Cool-down  Performed as group-led instruction    Resistance Training Performed  Yes    VAD Patient?  No    PAD/SET Patient?  No      Pain Assessment   Currently in Pain?  No/denies    Pain Score  0-No pain    Multiple Pain Sites  No       Capillary Blood Glucose: No results found for this or any previous visit (from the past 24 hour(s)).    Social History   Tobacco Use  Smoking Status Never Smoker  Smokeless Tobacco Never Used    Goals Met:  Proper associated with RPD/PD & O2 Sat Independence with exercise equipment Exercise tolerated well No report of cardiac concerns or symptoms Strength training completed today  Goals Unmet:  Not Applicable  Comments: Pt able to follow exercise prescription today without complaint.  Will continue to monitor for progression. Check out 0915.   Dr. Kate Sable is Medical Director for [facility] and [facility].

## 2018-09-18 ENCOUNTER — Encounter (HOSPITAL_COMMUNITY)
Admission: RE | Admit: 2018-09-18 | Discharge: 2018-09-18 | Disposition: A | Discharge status: Home / Self Care | Payer: Medicare Other | Source: Ambulatory Visit | Attending: Cardiovascular Disease | Admitting: Cardiovascular Disease

## 2018-09-18 DIAGNOSIS — Z955 Presence of coronary angioplasty implant and graft: Secondary | ICD-10-CM

## 2018-09-18 NOTE — Progress Notes (Signed)
Daily Session Note  Patient Details  Name: Jodi Ray MRN: [id number] Date of Birth: [birth year] Referring Provider:     CARDIAC REHAB PHASE II EXERCISE from 06/17/2018 in [place]  Referring Provider  Bronson Ing      Encounter Date: 09/18/2018  Check In: Session Check In - 09/18/18 0815      Check-In   Supervising physician immediately available to respond to emergencies  See telemetry face sheet for immediately available MD    Location  [facility]    Staff Present  Benay Pike, Exercise Physiologist;Debra Wynetta Emery, RN, BSN    Fall or balance concerns reported     No    Tobacco Cessation  No Change    Warm-up and Cool-down  Performed as group-led instruction    Resistance Training Performed  Yes    VAD Patient?  No    PAD/SET Patient?  No      Pain Assessment   Currently in Pain?  No/denies    Pain Score  0-No pain    Multiple Pain Sites  No       Capillary Blood Glucose: No results found for this or any previous visit (from the past 24 hour(s)).    Social History   Tobacco Use  Smoking Status Never Smoker  Smokeless Tobacco Never Used    Goals Met:  Proper associated with RPD/PD & O2 Sat Independence with exercise equipment Exercise tolerated well No report of cardiac concerns or symptoms Strength training completed today  Goals Unmet:  Not Applicable  Comments: Pt able to follow exercise prescription today without complaint.  Will continue to monitor for progression. Check out 0915.   Dr. Kate Sable is Medical Director for [facility] Cardiac and Pulmonary Rehab.

## 2018-09-19 DIAGNOSIS — E113593 Type 2 diabetes mellitus with proliferative diabetic retinopathy without macular edema, bilateral: Secondary | ICD-10-CM | POA: Diagnosis not present

## 2018-09-20 ENCOUNTER — Encounter (HOSPITAL_COMMUNITY)
Admission: RE | Admit: 2018-09-20 | Discharge: 2018-09-20 | Disposition: A | Discharge status: Home / Self Care | Payer: Medicare Other | Source: Ambulatory Visit | Attending: Cardiovascular Disease | Admitting: Cardiovascular Disease

## 2018-09-20 DIAGNOSIS — Z955 Presence of coronary angioplasty implant and graft: Secondary | ICD-10-CM

## 2018-09-20 NOTE — Progress Notes (Signed)
Daily Session Note  Patient Details  Name: Jodi Ray MRN: [id number] Date of Birth: 22-Jul-1961 Referring Provider:     CARDIAC REHAB PHASE II EXERCISE from 06/17/2018 in [place]  Referring Provider  Bronson Ing      Encounter Date: 09/20/2018  Check In: Session Check In - 09/20/18 0930      Check-In   Supervising physician immediately available to respond to emergencies  See telemetry face sheet for immediately available MD    Location  [facility]    Staff Present  Benay Pike, Exercise Physiologist;Debra Wynetta Emery, RN, BSN;Diane Coad, MS, EP, [facility], Exercise Physiologist    Medication changes reported      No    Fall or balance concerns reported     No    Tobacco Cessation  No Change    Warm-up and Cool-down  Performed as group-led instruction    Resistance Training Performed  Yes    VAD Patient?  No    PAD/SET Patient?  No      Pain Assessment   Currently in Pain?  Yes    Pain Score  7     Pain Location  Head    Pain Orientation  Medial    Pain Descriptors / Indicators  Throbbing    Pain Type  Chronic pain    Pain Radiating Towards  No raditation     Pain Onset  Yesterday    Pain Frequency  Intermittent    Aggravating Factors   Light activity     Pain Relieving Factors  Rest, lidocaine nasal spray     Effect of Pain on Daily Activities  limits activity if severe    Multiple Pain Sites  No       Capillary Blood Glucose: No results found for this or any previous visit (from the past 24 hour(s)).    Social History   Tobacco Use  Smoking Status Never Smoker  Smokeless Tobacco Never Used    Goals Met:  Proper associated with RPD/PD & O2 Sat Independence with exercise equipment Exercise tolerated well No report of cardiac concerns or symptoms Strength training completed today  Goals Unmet:  Not Applicable  Comments: Pt able to follow exercise prescription today without complaint.  Will continue to monitor  for progression. Check out 1030.   Dr. Kate Sable is Medical Director for [facility] Re[facility].

## 2018-09-23 ENCOUNTER — Encounter (HOSPITAL_COMMUNITY)
Admission: RE | Admit: 2018-09-23 | Discharge: 2018-09-23 | Disposition: A | Discharge status: Home / Self Care | Payer: Medicare Other | Source: Ambulatory Visit | Attending: Cardiovascular Disease | Admitting: Cardiovascular Disease

## 2018-09-23 DIAGNOSIS — Z955 Presence of coronary angioplasty implant and graft: Secondary | ICD-10-CM

## 2018-09-23 DIAGNOSIS — J3089 Other allergic rhinitis: Secondary | ICD-10-CM | POA: Diagnosis not present

## 2018-09-23 DIAGNOSIS — J3081 Allergic rhinitis due to animal (cat) (dog) hair and dander: Secondary | ICD-10-CM | POA: Diagnosis not present

## 2018-09-23 DIAGNOSIS — J301 Allergic rhinitis due to pollen: Secondary | ICD-10-CM | POA: Diagnosis not present

## 2018-09-23 NOTE — Progress Notes (Signed)
Daily Session Note  Patient Details  Name: Jodi Ray MRN: [id number] Date of Birth: 12/31/1960 Referring Provider:     CARDIAC REHAB PHASE II EXERCISE from 06/17/2018 in [place]  Referring Provider  Bronson Ing      Encounter Date: 09/23/2018  Check In: Session Check In - 09/23/18 0815      Check-In   Supervising physician immediately available to respond to emergencies  See telemetry face sheet for immediately available MD    Location  [facility]    Staff Present  Benay Pike, Exercise Physiologist;Debra Wynetta Emery, RN, BSN    Medication changes reported      No    Fall or balance concerns reported     No    Tobacco Cessation  No Change    Warm-up and Cool-down  Performed as group-led instruction    Resistance Training Performed  Yes    VAD Patient?  No    PAD/SET Patient?  No      Pain Assessment   Currently in Pain?  No/denies    Pain Score  0-No pain    Multiple Pain Sites  No       Capillary Blood Glucose: No results found for this or any previous visit (from the past 24 hour(s)).    Social History   Tobacco Use  Smoking Status Never Smoker  Smokeless Tobacco Never Used    Goals Met:  Proper associated with RPD/PD & O2 Sat Independence with exercise equipment Exercise tolerated well No report of cardiac concerns or symptoms Strength training completed today  Goals Unmet:  Not Applicable  Comments: Pt able to follow exercise prescription today without complaint.  Will continue to monitor for progression. Check out 0915.   Dr. Kate Sable is Medical Director for [facility] Cardiac and Pulmonary Rehab.

## 2018-09-25 ENCOUNTER — Encounter (HOSPITAL_COMMUNITY)
Admission: RE | Admit: 2018-09-25 | Discharge: 2018-09-25 | Disposition: A | Discharge status: Home / Self Care | Payer: Medicare Other | Source: Ambulatory Visit | Attending: Cardiovascular Disease | Admitting: Cardiovascular Disease

## 2018-09-25 DIAGNOSIS — J3089 Other allergic rhinitis: Secondary | ICD-10-CM | POA: Diagnosis not present

## 2018-09-25 DIAGNOSIS — J3081 Allergic rhinitis due to animal (cat) (dog) hair and dander: Secondary | ICD-10-CM | POA: Diagnosis not present

## 2018-09-25 DIAGNOSIS — J301 Allergic rhinitis due to pollen: Secondary | ICD-10-CM | POA: Diagnosis not present

## 2018-09-25 DIAGNOSIS — Z955 Presence of coronary angioplasty implant and graft: Secondary | ICD-10-CM | POA: Diagnosis not present

## 2018-09-25 NOTE — Progress Notes (Signed)
Daily Session Note  Patient Details  Name: Jodi Ray MRN: [id number] Date of Birth: 02-22-1961 Referring Provider:     CARDIAC REHAB PHASE II EXERCISE from 06/17/2018 in [place]  Referring Provider  Bronson Ing      Encounter Date: 09/25/2018  Check In: Session Check In - 09/25/18 0815      Check-In   Supervising physician immediately available to respond to emergencies  See telemetry face sheet for immediately available MD    Location  [facility]    Staff Present  Benay Pike, Exercise Physiologist;Debra Wynetta Emery, RN, BSN    Medication changes reported      No    Fall or balance concerns reported     No    Tobacco Cessation  No Change    Warm-up and Cool-down  Performed as group-led instruction    Resistance Training Performed  Yes    VAD Patient?  No    PAD/SET Patient?  No      Pain Assessment   Currently in Pain?  No/denies    Pain Score  0-No pain    Multiple Pain Sites  No       Capillary Blood Glucose: No results found for this or any previous visit (from the past 24 hour(s)).    Social History   Tobacco Use  Smoking Status Never Smoker  Smokeless Tobacco Never Used    Goals Met:  Independence with exercise equipment Exercise tolerated well No report of cardiac concerns or symptoms Strength training completed today  Goals Unmet:  Not Applicable  Comments: Pt able to follow exercise prescription today without complaint.  Will continue to monitor for progression. Check out 0915.   Dr. Kate Sable is Medical Director for [facility] Card[facility] and Pulmonary Rehab.

## 2018-09-27 DIAGNOSIS — E119 Type 2 diabetes mellitus without complications: Secondary | ICD-10-CM | POA: Diagnosis not present

## 2018-09-27 DIAGNOSIS — M159 Polyosteoarthritis, unspecified: Secondary | ICD-10-CM | POA: Diagnosis not present

## 2018-09-27 DIAGNOSIS — I10 Essential (primary) hypertension: Secondary | ICD-10-CM | POA: Diagnosis not present

## 2018-09-30 DIAGNOSIS — Z9641 Presence of insulin pump (external) (internal): Secondary | ICD-10-CM | POA: Diagnosis not present

## 2018-09-30 DIAGNOSIS — M1460 Charcot's joint, unspecified site: Secondary | ICD-10-CM | POA: Diagnosis not present

## 2018-09-30 DIAGNOSIS — E1140 Type 2 diabetes mellitus with diabetic neuropathy, unspecified: Secondary | ICD-10-CM | POA: Diagnosis not present

## 2018-09-30 DIAGNOSIS — E039 Hypothyroidism, unspecified: Secondary | ICD-10-CM | POA: Diagnosis not present

## 2018-09-30 DIAGNOSIS — J301 Allergic rhinitis due to pollen: Secondary | ICD-10-CM | POA: Diagnosis not present

## 2018-09-30 DIAGNOSIS — J3081 Allergic rhinitis due to animal (cat) (dog) hair and dander: Secondary | ICD-10-CM | POA: Diagnosis not present

## 2018-09-30 DIAGNOSIS — E109 Type 1 diabetes mellitus without complications: Secondary | ICD-10-CM | POA: Diagnosis not present

## 2018-09-30 DIAGNOSIS — E669 Obesity, unspecified: Secondary | ICD-10-CM | POA: Diagnosis not present

## 2018-09-30 DIAGNOSIS — E11319 Type 2 diabetes mellitus with unspecified diabetic retinopathy without macular edema: Secondary | ICD-10-CM | POA: Diagnosis not present

## 2018-09-30 DIAGNOSIS — I10 Essential (primary) hypertension: Secondary | ICD-10-CM | POA: Diagnosis not present

## 2018-09-30 DIAGNOSIS — E118 Type 2 diabetes mellitus with unspecified complications: Secondary | ICD-10-CM | POA: Diagnosis not present

## 2018-09-30 DIAGNOSIS — J3089 Other allergic rhinitis: Secondary | ICD-10-CM | POA: Diagnosis not present

## 2018-09-30 NOTE — Progress Notes (Signed)
Cardiac Individual Treatment Plan  Patient Details  Name: Jodi Ray MRN: [id number] Date of Birth: Jul 31, 1961 Referring Provider:     CARDIAC REHAB PHASE II EXERCISE from 06/17/2018 in [facility]  Referring Provider  Bronson Ing      Initial Encounter Date:    CARDIAC REHAB PHASE II EXERCISE from 06/17/2018 in [place]  Date  06/17/18      Visit Diagnosis: Status post coronary artery stent placement  Patient's Home Medications on Admission:  Current Outpatient Medications:  .  aspirin EC 81 MG tablet, Take 81 mg by mouth daily., Disp: , Rfl:  .  atorvastatin (LIPITOR) 80 MG tablet, Take 1 tablet (80 mg total) by mouth daily at 6 PM., Disp: 90 tablet, Rfl: 3 .  carvedilol (COREG) 3.125 MG tablet, Take 1 tablet (3.125 mg total) by mouth 2 (two) times daily., Disp: 180 tablet, Rfl: 3 .  Cholecalciferol (VITAMIN D-3) 5000 units TABS, Take 5,000 Units by mouth daily. , Disp: , Rfl:  .  clopidogrel (PLAVIX) 75 MG tablet, Take 1 tablet (75 mg total) by mouth daily with breakfast., Disp: 90 tablet, Rfl: 3 .  EPINEPHrine 0.3 mg/0.3 mL IJ SOAJ injection, Inject 0.3 mg into the muscle as needed., Disp: , Rfl: 1 .  Fluocinolone Acetonide (DERMOTIC) 0.01 % OIL, Place 1 drop into both ears at bedtime. , Disp: , Rfl:  .  furosemide (LASIX) 20 MG tablet, Take 1 tablet (20 mg total) by mouth as needed., Disp: 30 tablet, Rfl: 3 .  gabapentin (NEURONTIN) 400 MG capsule, Take 400 mg by mouth at bedtime., Disp: , Rfl:  .  gabapentin (NEURONTIN) 800 MG tablet, Take 800 mg by mouth at bedtime., Disp: , Rfl:  .  Insulin Human (INSULIN PUMP) SOLN, Inject into the skin continuous. , Disp: , Rfl:  .  levocetirizine (XYZAL) 5 MG tablet, Take 5 mg by mouth daily with breakfast., Disp: , Rfl:  .  levothyroxine (SYNTHROID, LEVOTHROID) 125 MCG tablet, Take 125 mcg by mouth daily before breakfast. , Disp: , Rfl: 0 .  lisinopril (PRINIVIL,ZESTRIL) 10 MG tablet,  Take 10 mg by mouth daily., Disp: , Rfl:  .  metFORMIN (GLUCOPHAGE) 500 MG tablet, Take 1 tablet (500 mg total) by mouth 2 (two) times daily with a meal. Do not take 03/23/2018 and 03/24/2018. Restart taking from 03/25/2018. (Patient taking differently: Take 500 mg by mouth 2 (two) times daily with a meal. ), Disp: , Rfl:  .  Multiple Vitamins-Minerals (CENTRUM SILVER 50+WOMEN) TABS, Take 1 tablet by mouth daily. , Disp: , Rfl:  .  nitroGLYCERIN (NITROSTAT) 0.4 MG SL tablet, Place 1 tablet (0.4 mg total) under the tongue every 5 (five) minutes as needed for chest pain., Disp: 25 tablet, Rfl: 3 .  NOVOLOG 100 UNIT/ML injection, Inject into the skin continuous. , Disp: , Rfl: 0 .  Omega-3 Fatty Acids (FISH OIL) 1000 MG CAPS, Take 1,000 mg by mouth daily., Disp: , Rfl:  .  pantoprazole (PROTONIX) 40 MG tablet, Take 1 tablet (40 mg total) by mouth 2 (two) times daily before a meal., Disp: 60 tablet, Rfl: 0 .  Polyethylene Glycol 400 (BLINK TEARS) 0.25 % SOLN, Apply 1 drop to eye daily as needed (for eye irritation/dryness)., Disp: , Rfl:  .  predniSONE (STERAPRED UNI-PAK 21 TAB) 10 MG (21) TBPK tablet, As directed, Disp: 21 tablet, Rfl: 0 .  tizanidine (ZANAFLEX) 2 MG capsule, Take 1 capsule (2 mg total) by mouth at bedtime.,  Disp: 30 capsule, Rfl: 1 .  topiramate (TOPAMAX) 200 MG tablet, Take 1 tablet (200 mg total) by mouth daily., Disp: 30 tablet, Rfl: 5  Past Medical History: Past Medical History:  Diagnosis Date  . 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 Anemia   . Brain aneurysm    frontal lobe  . CAD (coronary artery disease)    a. 02/2018: s/p DES to Proximal LAD with residual 20% RCA stenosis.  . Chronic pain   . Diabetes mellitus without complication ([place])   . Headache   . History of left bundle branch block (LBBB)   . HTN (hypertension)   . Hx of cardiovascular stress test 10/2016   intermediate risk study  . Hypothyroidism   . Neuromuscular disorder ([place])   . Neuropathy   . Obesity   . OSA on CPAP  03/10/2015  . Retinopathy   . Sleep apnea   . Varicose veins of both lower extremities     Tobacco Use: Social History   Tobacco Use  Smoking Status Never Smoker  Smokeless Tobacco Never Used    Labs: Recent Review Flowsheet Data    Labs for ITP Cardiac and Pulmonary Rehab Latest Ref Rng & Units 01/15/2015 07/23/2015 03/20/2018 04/16/2018   Cholestrol 0 - 200 mg/dL - - - 134   LDLCALC 0 - 99 mg/dL - - - 54   HDL >40 mg/dL - - - 75   Trlycerides <150 mg/dL - - - 23   Hemoglobin A1c 4.8 - 5.6 % 9.6(A) 9.1(H) 7.8(H) -      Capillary Blood Glucose: Lab Results  Component Value Date   GLUCAP 196 (H) 04/17/2018   GLUCAP 128 (H) 04/16/2018   GLUCAP 108 (H) 04/16/2018   GLUCAP 195 (H) 04/16/2018   GLUCAP 107 (H) 04/16/2018     Exercise Target Goals: Exercise Program Goal: Individual exercise prescription set using results from initial 6 min walk test and THRR while considering  patient's activity barriers and safety.   Exercise Prescription Goal: Starting with aerobic activity 30 plus minutes a day, 3 days per week for initial exercise prescription. Provide home exercise prescription and guidelines that participant acknowledges understanding prior to discharge.  Activity Barriers & Risk Stratification: Activity Barriers & Cardiac Risk Stratification - 06/17/18 1413      Activity Barriers & Cardiac Risk Stratification   Activity Barriers  Balance Concerns;Muscular Weakness;Other (comment)    Comments  bilateral foot problems     Cardiac Risk Stratification  High       6 Minute Walk: 6 Minute Walk    Row Name 06/17/18 1411 09/30/18 1323       6 Minute Walk   Phase  Initial  Discharge    Distance  900 feet  1000 feet    Distance % Change  -  10 %    Distance Feet Change  -  100 ft    Walk Time  6 minutes  6 minutes    # of Rest Breaks  0  0    MPH  1.7  1.89    METS  2.3  2.45    RPE  7  7    Perceived Dyspnea   9  6    VO2 Peak  9.54  10.5    Symptoms  No  Yes  (comment)    Comments  -  6/10 foot pain, subsided with rest     Resting HR  94 bpm  90 bpm    Resting  BP  116/60  128/70    Resting Oxygen Saturation   94 %  94 %    Exercise Oxygen Saturation  during 6 min walk  98 %  93 %    Max Ex. HR  134 bpm  121 bpm    Max Ex. BP  162/68  166/74    2 Minute Post BP  120/60  126/68       Oxygen Initial Assessment:   Oxygen Re-Evaluation:   Oxygen Discharge (Final Oxygen Re-Evaluation):   Initial Exercise Prescription: Initial Exercise Prescription - 06/17/18 1400      Date of Initial Exercise RX and Referring Provider   Date  06/17/18    Referring Provider  Koneswaran    Expected Discharge Date  09/17/18      NuStep   Level  1    SPM  102    Minutes  17    METs  2.2      Arm Ergometer   Level  1    Watts  17    RPM  60    Minutes  17    METs  2.1      Prescription Details   Frequency (times per week)  3    Duration  Progress to 30 minutes of continuous aerobic without signs/symptoms of physical distress      Intensity   THRR 40-80% of Max Heartrate  [phone]    Ratings of Perceived Exertion  11-13    Perceived Dyspnea  0-4      Progression   Progression  Continue to progress workloads to maintain intensity without signs/symptoms of physical distress.      Resistance Training   Training Prescription  Yes    Weight  1    Reps  10-15       Perform Capillary Blood Glucose checks as needed.  Exercise Prescription Changes:  Exercise Prescription Changes    Row Name 07/03/18 1400 07/16/18 1500 08/01/18 0800 08/13/18 1500 08/27/18 1200     Response to Exercise   Blood Pressure (Admit)  152/64  122/68  126/64  126/78  136/64   Blood Pressure (Exercise)  184/70  172/70  146/80  140/82  148/74   Blood Pressure (Exit)  130/58  116/62  118/62  102/62  98/50   Heart Rate (Admit)  81 bpm  72 bpm  106 bpm  96 bpm  87 bpm   Heart Rate (Exercise)  113 bpm  99 bpm  97 bpm  168 bpm  112 bpm   Heart Rate (Exit)  96 bpm   78 bpm  102 bpm  105 bpm  94 bpm   Rating of Perceived Exertion (Exercise)  '13  11  11  11  11   ' Comments  First two weeks of exercise!   -  -  -  -   Duration  Progress to 30 minutes of  aerobic without signs/symptoms of physical distress  Progress to 30 minutes of  aerobic without signs/symptoms of physical distress  Continue with 30 min of aerobic exercise without signs/symptoms of physical distress.  Continue with 30 min of aerobic exercise without signs/symptoms of physical distress.  Continue with 30 min of aerobic exercise without signs/symptoms of physical distress.   Intensity  THRR New 120-134-147  THRR unchanged  THRR unchanged  THRR unchanged  THRR unchanged     Progression   Progression  Continue to progress workloads to maintain intensity without signs/symptoms of physical distress.  Continue to progress workloads to maintain intensity without signs/symptoms of physical distress.  Continue to progress workloads to maintain intensity without signs/symptoms of physical distress.  Continue to progress workloads to maintain intensity without signs/symptoms of physical distress.  Continue to progress workloads to maintain intensity without signs/symptoms of physical distress.   Average METs  2.7  2.9  3.3  3.2  3.25     Resistance Training   Training Prescription  Yes  Yes  Yes  Yes  Yes   Weight  '1  2  3  3  4   ' Reps  10-15  10-15  10-15  10-15  10-15     NuStep   Level  '1  2  2  3  3   ' SPM  93  98  117  125  121   Minutes  '22  22  22  22  22   ' METs  2.2  2.3  2.3  2.4  2.4     Arm Ergometer   Level  1.'8  2  2  3  3   ' Watts  30  37  80  68  44   RPM  55  79  27  38  74   Minutes  '17  17  17  17  17   ' METs  3.2  3.8  4.3  4  4.1     Home Exercise Plan   Plans to continue exercise at  -  -  Home (comment) walking and strethcing  Home (comment)  Home (comment)   Frequency  -  -  Add 2 additional days to program exercise sessions.  Add 2 additional days to program exercise  sessions.  Add 2 additional days to program exercise sessions.   Initial Home Exercises Provided  -  -  06/17/18  06/17/18  06/17/18   Row Name 09/13/18 1400             Response to Exercise   Blood Pressure (Admit)  130/60       Blood Pressure (Exercise)  170/70       Blood Pressure (Exit)  100/60       Heart Rate (Admit)  81 bpm       Heart Rate (Exercise)  118 bpm       Heart Rate (Exit)  103 bpm       Rating of Perceived Exertion (Exercise)  11       Comments  5 more sessions!        Duration  Continue with 30 min of aerobic exercise without signs/symptoms of physical distress.       Intensity  THRR unchanged         Progression   Progression  Continue to progress workloads to maintain intensity without signs/symptoms of physical distress.       Average METs  3.4         Resistance Training   Training Prescription  Yes       Weight  4       Reps  10-15         NuStep   Level  3       SPM  145       Minutes  22       METs  2.6         Arm Ergometer   Level  3       Watts  10  RPM  70       Minutes  17       METs  4.2         Home Exercise Plan   Plans to continue exercise at  Home (comment)       Frequency  Add 2 additional days to program exercise sessions.       Initial Home Exercises Provided  06/17/18          Exercise Comments:  Exercise Comments    Row Name 07/16/18 1532 08/06/18 1525 09/03/18 0820       Exercise Comments  Pt. has done great in rehab so far during her first 9 sessions. She has tolerated the exercise well and pushes herself to increase workload every day. We will continue to progress her as tolerated.   Pt. has continued to work hard and progress through the program. She is up to level 3 on the NuStep as well as the Arm Ergometer. We will continue to increase her workloads as best fit.   Pt. is doing great and continues to work hard in [place]. She is doing small intervals on the NuStep with 30 second bursts of hard work and a minute of  regular. She loves this and loves working hard on the equipment to improve her distance and SPM.         Exercise Goals and Review:  Exercise Goals    Row Name 06/17/18 1415             Exercise Goals   Increase Physical Activity  Yes       Intervention  Provide advice, education, support and counseling about physical activity/exercise needs.;Develop an individualized exercise prescription for aerobic and resistive training based on initial evaluation findings, risk stratification, comorbidities and participant's personal goals.       Expected Outcomes  Short Term: Attend rehab on a regular basis to increase amount of physical activity.       Increase Strength and Stamina  Yes       Intervention  Provide advice, education, support and counseling about physical activity/exercise needs.;Develop an individualized exercise prescription for aerobic and resistive training based on initial evaluation findings, risk stratification, comorbidities and participant's personal goals.       Expected Outcomes  Short Term: Increase workloads from initial exercise prescription for resistance, speed, and METs.       Able to understand and use rate of perceived exertion (RPE) scale  Yes       Intervention  Provide education and explanation on how to use RPE scale       Expected Outcomes  Short Term: Able to use RPE daily in rehab to express subjective intensity level;Long Term:  Able to use RPE to guide intensity level when exercising independently       Able to understand and use Dyspnea scale  Yes       Intervention  Provide education and explanation on how to use Dyspnea scale       Expected Outcomes  Short Term: Able to use Dyspnea scale daily in rehab to express subjective sense of shortness of breath during exertion;Long Term: Able to use Dyspnea scale to guide intensity level when exercising independently       Knowledge and understanding of Target Heart Rate Range (THRR)  Yes       Intervention   Provide education and explanation of THRR including how the numbers were predicted and where they are located for reference  Expected Outcomes  Long Term: Able to use THRR to govern intensity when exercising independently;Short Term: Able to state/look up THRR;Short Term: Able to use daily as guideline for intensity in rehab       Able to check pulse independently  Yes       Intervention  Provide education and demonstration on how to check pulse in carotid and radial arteries.;Review the importance of being able to check your own pulse for safety during independent exercise       Expected Outcomes  Short Term: Able to explain why pulse checking is important during independent exercise;Long Term: Able to check pulse independently and accurately       Understanding of Exercise Prescription  Yes       Intervention  Provide education, explanation, and written materials on patient's individual exercise prescription       Expected Outcomes  Short Term: Able to explain program exercise prescription;Long Term: Able to explain home exercise prescription to exercise independently          Exercise Goals Re-Evaluation : Exercise Goals Re-Evaluation    Row Name 07/16/18 1531 08/06/18 1523 09/03/18 0819         Exercise Goal Re-Evaluation   Exercise Goals Review  Increase Physical Activity;Increase Strength and Stamina;Able to understand and use rate of perceived exertion (RPE) scale;Knowledge and understanding of Target Heart Rate Range (THRR);Able to check pulse independently;Understanding of Exercise Prescription;Improve claudication pain tolerance and improve walking ability  Increase Physical Activity;Increase Strength and Stamina;Able to understand and use rate of perceived exertion (RPE) scale;Knowledge and understanding of Target Heart Rate Range (THRR);Able to check pulse independently;Understanding of Exercise Prescription;Improve claudication pain tolerance and improve walking ability  Increase  Physical Activity;Increase Strength and Stamina;Able to understand and use rate of perceived exertion (RPE) scale;Knowledge and understanding of Target Heart Rate Range (THRR);Able to check pulse independently;Understanding of Exercise Prescription;Improve claudication pain tolerance and improve walking ability     Comments  Pt. has done well in the program so far, she has attended 9 sessions. She has tolerated the exercise and progressions well and is eager to come in, work hard and push herself.   Pt. continues to do well in [place]. She always comes to class ready to work and pushes herself on both pieces of equipment in order to increase her distance and overall MET level.   Pt. is doing great in [place]. She pushes herself hard every session to get better and get farther on the equipment than she did before. She has had some set backs with headaches but hasn't let that stop her from exercising.      Expected Outcomes  Improve walking, walk without pain, lose weight.   Improve walking, walk without pain, lose weight.   Improve walking, walk without pain, lose weight.          Discharge Exercise Prescription (Final Exercise Prescription Changes): Exercise Prescription Changes - 09/13/18 1400      Response to Exercise   Blood Pressure (Admit)  130/60    Blood Pressure (Exercise)  170/70    Blood Pressure (Exit)  100/60    Heart Rate (Admit)  81 bpm    Heart Rate (Exercise)  118 bpm    Heart Rate (Exit)  103 bpm    Rating of Perceived Exertion (Exercise)  11    Comments  5 more sessions!     Duration  Continue with 30 min of aerobic exercise without signs/symptoms of physical distress.  Intensity  THRR unchanged      Progression   Progression  Continue to progress workloads to maintain intensity without signs/symptoms of physical distress.    Average METs  3.4      Resistance Training   Training Prescription  Yes    Weight  4    Reps  10-15      NuStep   Level  3    SPM  145    Minutes  22     METs  2.6      Arm Ergometer   Level  3    Watts  10    RPM  70    Minutes  17    METs  4.2      Home Exercise Plan   Plans to continue exercise at  Home (comment)    Frequency  Add 2 additional days to program exercise sessions.    Initial Home Exercises Provided  06/17/18       Nutrition:  Target Goals: Understanding of nutrition guidelines, daily intake of sodium <157m, cholesterol <2039m calories 30% from fat and 7% or less from saturated fats, daily to have 5 or more servings of fruits and vegetables.  Biometrics: Pre Biometrics - 06/17/18 1416      Pre Biometrics   Height  '5\' 8"'  (1.727 m)    Waist Circumference  44 inches    Hip Circumference  54 inches    Waist to Hip Ratio  0.81 %    Triceps Skinfold  23 mm    % Body Fat  46 %    Grip Strength  17.5 kg    Flexibility  0 in    Single Leg Stand  1.5 seconds        Nutrition Therapy Plan and Nutrition Goals: Nutrition Therapy & Goals - 09/05/18 0940      Nutrition Therapy   RD appointment deferred  Yes      Personal Nutrition Goals   Comments  Patient was reminded of RD appointment tomorrow. She continues to says she is following a diabetic diet. Will continue to monitor for progress.       Intervention Plan   Intervention  Nutrition handout(s) given to patient.       Nutrition Assessments: Nutrition Assessments - 09/26/18 1600      MEDFICTS Scores   Pre Score  6    Post Score  3    Score Difference  -3       Nutrition Goals Re-Evaluation:   Nutrition Goals Discharge (Final Nutrition Goals Re-Evaluation):   Psychosocial: Target Goals: Acknowledge presence or absence of significant depression and/or stress, maximize coping skills, provide positive support system. Participant is able to verbalize types and ability to use techniques and skills needed for reducing stress and depression.  Initial Review & Psychosocial Screening: Initial Psych Review & Screening - 06/17/18 1521       Initial Review   Current issues with  None Identified      Family Dynamics   Good Support System?  Yes      Barriers   Psychosocial barriers to participate in program  There are no identifiable barriers or psychosocial needs.      Screening Interventions   Interventions  Encouraged to exercise    Expected Outcomes  Short Term goal: Identification and review with participant of any Quality of Life or Depression concerns found by scoring the questionnaire.;Long Term goal: The participant improves quality of Life and PHQ9 Scores as  seen by post scores and/or verbalization of changes       Quality of Life Scores: Quality of Life - 09/20/18 1533      Quality of Life   Select  Quality of Life      Quality of Life Scores   Health/Function Pre  24.41 %    Health/Function Post  28.5 %    Health/Function % Change  16.76 %    Socioeconomic Pre  20.36 %    Socioeconomic Post  25 %    Socioeconomic % Change   22.79 %    Psych/Spiritual Pre  26.43 %    Psych/Spiritual Post  29.29 %    Psych/Spiritual % Change  10.82 %    Family Pre  30 %    Family Post  25.5 %    Family % Change  -15 %    GLOBAL Pre  24.08 %    GLOBAL Post  27.61 %    GLOBAL % Change  14.66 %      Scores of 19 and below usually indicate a poorer quality of life in these areas.  A difference of  2-3 points is a clinically meaningful difference.  A difference of 2-3 points in the total score of the Quality of Life Index has been associated with significant improvement in overall quality of life, self-image, physical symptoms, and general health in studies assessing change in quality of life.  PHQ-9: Recent Review Flowsheet Data    Depression screen [facility] 2/9 09/26/2018 06/17/2018 04/09/2017 08/04/2015 04/30/2015   Decreased Interest 0 0 0 0 0   Down, Depressed, Hopeless 0 0 0 0 0   PHQ - 2 Score 0 0 0 0 0   Altered sleeping 3 0 - - -   Tired, decreased energy 0 0 - - -   Change in appetite 0 0 - - -   Feeling bad or  failure about yourself  0 0 - - -   Trouble concentrating 0 0 - - -   Moving slowly or fidgety/restless 0 0 - - -   Suicidal thoughts 0 0 - - -   PHQ-9 Score 3 0 - - -   Difficult doing work/chores Not difficult at all Not difficult at all - - -     Interpretation of Total Score  Total Score Depression Severity:  1-4 = Minimal depression, 5-9 = Mild depression, 10-14 = Moderate depression, 15-19 = Moderately severe depression, 20-27 = Severe depression   Psychosocial Evaluation and Intervention: Psychosocial Evaluation - 09/26/18 1625      Discharge Psychosocial Assessment & Intervention   Comments  Patient has no psychosocial issues identified at discharge. Her exit QOL score improved by 14.69% at 27.61% and her PHQ_9 score increased went from 0 to 3 due to her chronic headaches interrupting her sleep.        Psychosocial Re-Evaluation: Psychosocial Re-Evaluation    Row Name 07/18/18 [phone] 08/08/18 1016 09/05/18 0949         Psychosocial Re-Evaluation   Current issues with  None Identified  None Identified  None Identified     Comments  Patient's initial QOL score was 24.08 and her PHQ-9 score was 0 with no psychosocial issues identified.   Patient's initial QOL score was 24.08 and her PHQ-9 score was 0 with no psychosocial issues identified.   Patient's initial QOL score was 24.08 and her PHQ-9 score was 0 with no psychosocial issues identified.      Expected  Outcomes  Patient will have no psychosocial issues identified at discharge.   Patient will have no psychosocial issues identified at discharge.   Patient will have no psychosocial issues identified at discharge.      Interventions  Stress management education;Encouraged to attend Cardiac Rehabilitation for the exercise;Relaxation education  Stress management education;Encouraged to attend Cardiac Rehabilitation for the exercise;Relaxation education  Stress management education;Encouraged to attend Cardiac Rehabilitation for the  exercise;Relaxation education     Continue Psychosocial Services   No Follow up required  No Follow up required  No Follow up required        Psychosocial Discharge (Final Psychosocial Re-Evaluation): Psychosocial Re-Evaluation - 09/05/18 0949      Psychosocial Re-Evaluation   Current issues with  None Identified    Comments  Patient's initial QOL score was 24.08 and her PHQ-9 score was 0 with no psychosocial issues identified.     Expected Outcomes  Patient will have no psychosocial issues identified at discharge.     Interventions  Stress management education;Encouraged to attend Cardiac Rehabilitation for the exercise;Relaxation education    Continue Psychosocial Services   No Follow up required       Vocational Rehabilitation: Provide vocational rehab assistance to qualifying candidates.   Vocational Rehab Evaluation & Intervention: Vocational Rehab - 06/17/18 1530      Initial Vocational Rehab Evaluation & Intervention   Assessment shows need for Vocational Rehabilitation  No       Education: Education Goals: Education classes will be provided on a weekly basis, covering required topics. Participant will state understanding/return demonstration of topics presented.  Learning Barriers/Preferences: Learning Barriers/Preferences - 06/17/18 1529      Learning Barriers/Preferences   Learning Barriers  None    Learning Preferences  Audio;Computer/Internet;Group Instruction;Individual Instruction;Pictoral;Skilled Demonstration;Verbal Instruction;Video;Written Material       Education Topics: Hypertension, Hypertension Reduction -Define heart disease and high blood pressure. Discus how high blood pressure affects the body and ways to reduce high blood pressure.   CARDIAC REHAB PHASE II EXERCISE from 09/25/2018 in [place]  Date  09/04/18  Educator  DJ  Instruction Review Code  2- Demonstrated Understanding      Exercise and Your Heart -Discuss  why it is important to exercise, the FITT principles of exercise, normal and abnormal responses to exercise, and how to exercise safely.   CARDIAC REHAB PHASE II EXERCISE from 09/25/2018 in [place]  Date  09/11/18  Educator  Wynetta Emery  Instruction Review Code  2- Demonstrated Understanding      Angina -Discuss definition of angina, causes of angina, treatment of angina, and how to decrease risk of having angina.   CARDIAC REHAB PHASE II EXERCISE from 09/25/2018 in [place]  Date  09/18/18  Educator  Wynetta Emery  Instruction Review Code  2- Demonstrated Understanding      Cardiac Medications -Review what the following cardiac medications are used for, how they affect the body, and side effects that may occur when taking the medications.  Medications include Aspirin, Beta blockers, calcium channel blockers, ACE Inhibitors, angiotensin receptor blockers, diuretics, digoxin, and antihyperlipidemics.   CARDIAC REHAB PHASE II EXERCISE from 09/25/2018 in [place]  Date  06/26/18  Educator  Etheleen Mayhew  Instruction Review Code  2- Demonstrated Understanding      Congestive Heart Failure -Discuss the definition of CHF, how to live with CHF, the signs and symptoms of CHF, and how keep track of weight and sodium  intake.   CARDIAC REHAB PHASE II EXERCISE from 09/25/2018 in [place]  Date  07/03/18  Educator  Wynetta Emery  Instruction Review Code  2- Demonstrated Understanding      Heart Disease and Intimacy -Discus the effect sexual activity has on the heart, how changes occur during intimacy as we age, and safety during sexual activity.   CARDIAC REHAB PHASE II EXERCISE from 09/25/2018 in [place]  Date  07/10/18  Educator  Etheleen Mayhew  Instruction Review Code  2- Demonstrated Understanding      Smoking Cessation / COPD -Discuss different methods to quit smoking, the health  benefits of quitting smoking, and the definition of COPD.   CARDIAC REHAB PHASE II EXERCISE from 09/25/2018 in [place]  Date  07/17/18  Educator  Etheleen Mayhew   Instruction Review Code  2- Demonstrated Understanding      Nutrition I: Fats -Discuss the types of cholesterol, what cholesterol does to the heart, and how cholesterol levels can be controlled.   Nutrition II: Labels -Discuss the different components of food labels and how to read food label   CARDIAC REHAB PHASE II EXERCISE from 09/25/2018 in [place]  Date  08/07/18  Educator  DC  Instruction Review Code  2- Demonstrated Understanding      Heart Parts/Heart Disease and PAD -Discuss the anatomy of the heart, the pathway of blood circulation through the heart, and these are affected by heart disease.   CARDIAC REHAB PHASE II EXERCISE from 09/25/2018 in [place]  Date  08/02/18  Educator  Wynetta Emery  Instruction Review Code  2- Demonstrated Understanding      Stress I: Signs and Symptoms -Discuss the causes of stress, how stress may lead to anxiety and depression, and ways to limit stress.   CARDIAC REHAB PHASE II EXERCISE from 09/25/2018 in [place]  Date  08/14/18  Educator  DJ  Instruction Review Code  2- Demonstrated Understanding      Stress II: Relaxation -Discuss different types of relaxation techniques to limit stress.   Warning Signs of Stroke / TIA -Discuss definition of a stroke, what the signs and symptoms are of a stroke, and how to identify when someone is having stroke.   CARDIAC REHAB PHASE II EXERCISE from 09/25/2018 in [place]  Date  08/28/18  Educator  DJ  Instruction Review Code  2- Demonstrated Understanding      Knowledge Questionnaire Score: Knowledge Questionnaire Score - 09/26/18 1559      Knowledge Questionnaire Score   Pre Score  23/24    Post Score  24/24        Core Components/Risk Factors/Patient Goals at Admission: Personal Goals and Risk Factors at Admission - 06/17/18 1530      Core Components/Risk Factors/Patient Goals on Admission    Weight Management  Yes    Intervention  Weight Management/Obesity: Establish reasonable short term and long term weight goals.    Admit Weight  261 lb (118.4 kg)    Goal Weight: Short Term  246 lb (111.6 kg)    Goal Weight: Long Term  231 lb (104.8 kg)    Expected Outcomes  Short Term: Continue to assess and modify interventions until short term weight is achieved;Long Term: Adherence to nutrition and physical activity/exercise program aimed toward attainment of established weight goal    Personal Goal Other  Yes    Personal Goal  Be able  to walk further and lose 30lbs    Intervention  Attend program 3 x week and supplement with 2 days week at home stretching and hand heald weights.    Expected Outcomes  Reach personal goals.        Core Components/Risk Factors/Patient Goals Review:  Goals and Risk Factor Review    Row Name 07/18/18 0827 08/08/18 1013 09/05/18 0941 09/26/18 1617       Core Components/Risk Factors/Patient Goals Review   Personal Goals Review  Weight Management/Obesity;Diabetes Walk further; lose 30 lbs.   Weight Management/Obesity;Diabetes Walk further; lose 30 lbs.   Weight Management/Obesity;Diabetes Walk further; lose 30 lbs.   Weight Management/Obesity;Diabetes Walk further; lose 30 lbs.     Review  Patient has completed 10 sessions maintaining her weight since her initial visit. She is doing well in the program with progression. She was seen in the ED 07/16/18 with a near syncope episode transported by EMS. She was hypotensive and later evaluated by her cardiologist. He adjusted her medication. Her b/p have been stable. Her last A1C on file was 7.8 on 03/20/18. Her fasting reported glucose readings are usually below 120. Will continue to monitor for progress.   Patient has completed 16  sessions losing 10 lbs since last 30 day review. She continues to do well in the program with progression. Her last report A1C was 05/2018 at 7.7 mg/dl. Her reported fasting glucose readings average 115 mg/dl. She is on an insulin pump. She says she has a lot more energy and is able to walk further and is doing more around the house. Will continue to monitor for progress.   Patient has completed 27 sessions losing 1.5 lb since last 30 day review. She continues to do well in the program with progression. She reports her last A1C was 7.2 mg/dl on 06/2018. Her fasting glucose readings she reports are usually >120. She states her energy level has increased and she is able to do more around the house. She continues to suffer from migraine headaches and has seen a neurologist for this. He prescribed Lodocaine 1 spray each nostril prn and increased her Trokendi XR to 200 mg daily. but this has not helped the headaches. He reommended starting O2 if the headaches are not controlled. Will continue to monitor for progress.   Patient graduated with 36 sessions losing 6.8 lbs overall and 3 inches in her waist and 2 inches in her hips. She did very well in the program. Her exit measurements improved in balance and grip strength. Her exit walk test improved by 10%. She states the program has helped me tremendously. She also says she learned a lot and is able to do a lot more at home. She is more active overall and feels stronger and has more energy. She says she is exercising 6 days/week now and has joined our [employer] program to continue exercising. CR will f/u for one year.     Expected Outcomes  Patient will continue to attend sessions and complete the program meeting her personal goals.   Patient will continue to attend sessions and complete the program meeting her personal goals.   Patient will continue to attend sessions and complete the program meeting her personal goals.   Patient will continue exercising  in our [facility] program and continue to meet her personal goals.        Core Components/Risk Factors/Patient Goals at Discharge (Final Review):  Goals and Risk Factor Review - 09/26/18 1617  Core Components/Risk Factors/Patient Goals Review   Personal Goals Review  Weight Management/Obesity;Diabetes   Walk further; lose 30 lbs.    Review  Patient graduated with 36 sessions losing 6.8 lbs overall and 3 inches in her waist and 2 inches in her hips. She did very well in the program. Her exit measurements improved in balance and grip strength. Her exit walk test improved by 10%. She states the program has helped me tremendously. She also says she learned a lot and is able to do a lot more at home. She is more active overall and feels stronger and has more energy. She says she is exercising 6 days/week now and has joined our [employer] program to continue exercising. CR will f/u for one year.     Expected Outcomes  Patient will continue exercising in our [facility] program and continue to meet her personal goals.        ITP Comments: ITP Comments    Row Name 07/01/18 1012           ITP Comments  Patient new to program. She has completed 4 sessions. Will continue to monitor for progress.           Comments: Patient graduated from [facility] today on 09/25/2018 after completing 36 sessions. She achieved LTG of 30 minutes of aerobic exercise at Max Met level of 2.45. All patients vitals are WNL. Patient has met with dietician. Discharge instruction has been reviewed in detail and patient stated an understanding of material given. Patient plans to join our [employer] program to continue exercising. Cardiac Rehab staff will make f/u calls at 1 month, 6 months, and 1 year. Patient had no complaints of any abnormal S/S or pain on their exit visit.

## 2018-09-30 NOTE — Progress Notes (Signed)
NEUROLOGY FOLLOW UP OFFICE NOTE  Jodi Ray [id number]  HISTORY OF PRESENT ILLNESS: Jodi Ray is a 58 year old handed woman with chronic pain related to complex regional pain syndrome (with spinal stimulator), hypertension, type 1 diabetes mellitus with polyneuropathy, OSA on CPAP, hypothyroidism, polymyalgia rheumatica who follows up for cluster headache.  UPDATE: Sed rate from 08/22/18 was 24. She developed intractable cluster headache in December.  Topiramate was increased.  She was prescribed lidocaine nasal for abortive therapy. Headache still present but less severe.  There is still a mild constant headache.  She has fluctuations of severe attacks that last 3 to 4 hours 4 times a week.  Lidocaine drops helpful but if taken too much, then causes bloody nose.  She feels unsteady in the morning.  No falls.   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 Location:Right frontal region Quality:stabbing Intensity:10/10 Aura:no Prodrome:no Associated symptoms:Some nausea if severe. No autonomic symptoms. Duration:10 to 60 minutes Frequency:4 times a week Triggers/exacerbating factors:change in weather. Relieving factors:none Activity:Cannot function when experiencing it.  Past preventative therapy:Indomethacin 25mg  three times daily (effective but discontinued due to elevated liver enzymes), Verapamil (ineffective)  II Cluster headache:  She began having new intractable headaches in late Decemeber 2019.  Her right eye gets bloodshot and right nare runs.  Right side of face gets red.  They stabbing headache starts above the right eye and radiates, constant for a month,  fluctuating in intensity.  She went to the [facility] ED on 08/11/18.  CT of head and CTA of head and neck were performed and personally reviewed, showing no acute abnormality or aneurysm.  Past management:  Prednisone taper, headache cocktail   III  Migraines:They are bi-frontal/maxillary and associated with slight nausea.She has had this before when her allergies "act up."  IV  Diabetic neuropathy: Secondary to Type 1 diabetes.  V  Complex regional pain syndrome: following an accident where she fell down the steps and crushed her right leg.She takes gabapentin, tramadol and has a spinal nerve stimulator.She takes this for her painful diabetic neuropathy as well.  VI  Right arm pain and numbness: Since 2015, she has had episodes of right arm pain and numbness. It only occurs when she is laying in bed, and occurs no matter what position. Her entire arm goes "dead". It is both numb, painful and unable to move it. There is no shooting pain down the arm from the neck, however she gets occasional shooting pain from the right side of her neck into the shoulder. She has to use her other arm to shake it out and it resolves in a minute or two. It occurs every night. She was sent to pain management for possible cervical radiculopathy or thoracic outlet syndrome. She did receive injections, which helped. She had an MRI of the cervical and thoracic spine on 06/12/14. Imaging not available, but report mentions diffuse facet arthropathy and degenerative changes in the cervical spine, but no nerve root impingement or cord compression. There was no evidence of thoracic outlet syndrome. She was sent to [facility] for further evaluation. 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 They told  her that her symptoms were related to her diabetic neuropathy.  VII   Cerebral aneurysm: In 2009, she had a CTA of the head which reportedly showed a 1-25mm an eurysm at the junction of right A1 and A2 segment of the ACA. However, a repeat CTA performed on 02/22/12 did not reveal any aneurysm.   She cannot have an MRI due to spinal stimulator.   PAST MEDICAL HISTORY: Past Medical History:  Diagnosis Date  . 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 Anemia   . Brain aneurysm    frontal lobe  . CAD (coronary artery disease)    a. 02/2018: s/p DES to Proximal LAD with residual 20% RCA stenosis.  . Chronic pain   . Diabetes mellitus without complication ([place])   . Headache   . History of left bundle branch block (LBBB)   . HTN (hypertension)   . Hx of cardiovascular stress test 10/2016   intermediate risk study  . Hypothyroidism   . Neuromuscular disorder ([place])   . Neuropathy   . Obesity   . OSA on CPAP 03/10/2015  . Retinopathy   . Sleep apnea   . Varicose veins of both lower extremities     MEDICATIONS: Current Outpatient Medications on File Prior to Visit  Medication Sig Dispense Refill  . aspirin EC 81 MG tablet Take 81 mg by mouth daily.    Marland Kitchen atorvastatin (LIPITOR) 80 MG tablet Take 1 tablet (80 mg total) by mouth daily at 6 PM. 90 tablet 3  . carvedilol (COREG) 3.125 MG tablet Take 1 tablet (3.125 mg total) by mouth 2 (two) times daily. 180 tablet 3  . Cholecalciferol (VITAMIN D-3) 5000 units TABS Take 5,000 Units by mouth daily.     . clopidogrel (PLAVIX) 75 MG tablet Take 1 tablet (75 mg total) by mouth daily with breakfast. 90 tablet 3  . EPINEPHrine 0.3 mg/0.3 mL IJ SOAJ injection Inject 0.3 mg into the muscle as needed.  1  . Fluocinolone Acetonide (DERMOTIC) 0.01 % OIL Place 1 drop into both ears at bedtime.     . furosemide (LASIX) 20 MG tablet Take 1 tablet (20 mg total) by mouth as needed. 30 tablet 3  . gabapentin (NEURONTIN) 400 MG capsule Take 400 mg by mouth at bedtime.    . gabapentin (NEURONTIN) 800 MG tablet Take 800 mg by mouth at bedtime.    .  Insulin Human (INSULIN PUMP) SOLN Inject into the skin continuous.     Marland Kitchen levocetirizine (XYZAL) 5 MG tablet Take 5 mg by mouth daily with breakfast.    . levothyroxine (SYNTHROID, LEVOTHROID) 125 MCG tablet Take 125 mcg by mouth daily before breakfast.   0  . lisinopril (PRINIVIL,ZESTRIL) 10 MG tablet Take 10 mg by mouth daily.    . metFORMIN (GLUCOPHAGE) 500 MG tablet Take 1 tablet (500 mg total) by mouth 2 (two) times daily with a meal. Do not take 03/23/2018 and 03/24/2018. Restart taking from 03/25/2018. (Patient taking differently: Take 500 mg by mouth 2 (two) times daily with a meal. )    . Multiple Vitamins-Minerals (CENTRUM SILVER 50+WOMEN) TABS Take 1 tablet by mouth daily.     . nitroGLYCERIN (NITROSTAT) 0.4 MG SL tablet Place 1 tablet (0.4 mg total) under the tongue every 5 (five) minutes as needed for chest pain. 25 tablet 3  . NOVOLOG 100 UNIT/ML injection Inject into the skin continuous.   0  . Omega-3 Fatty Acids (FISH OIL) 1000 MG CAPS Take 1,000 mg by  mouth daily.    . pantoprazole (PROTONIX) 40 MG tablet Take 1 tablet (40 mg total) by mouth 2 (two) times daily before a meal. 60 tablet 0  . Polyethylene Glycol 400 (BLINK TEARS) 0.25 % SOLN Apply 1 drop to eye daily as needed (for eye irritation/dryness).    . predniSONE (STERAPRED UNI-PAK 21 TAB) 10 MG (21) TBPK tablet As directed 21 tablet 0  . tizanidine (ZANAFLEX) 2 MG capsule Take 1 capsule (2 mg total) by mouth at bedtime. 30 capsule 1  . topiramate (TOPAMAX) 200 MG tablet Take 1 tablet (200 mg total) by mouth daily. 30 tablet 5   No current facility-administered medications on file prior to visit.     ALLERGIES: Allergies  Allergen Reactions  . Fentanyl Nausea And Vomiting and Other (See Comments)    Nausea, vomiting, loss of consciousness, requiring reversal    FAMILY HISTORY: Family History  Problem Relation Age of Onset  . Cancer Mother 54       colon  . Cancer Father        renal cell  . Cancer Brother         bladder  . Cancer Sister        primary brain, and primary breast too  . Cancer Sister        breast   . Cancer Other        multiple aunts with breast, cervical, or colon cancer   SOCIAL HISTORY: Social History   Socioeconomic History  . Marital status: Widowed    Spouse name: Not on file  . Number of children: Not on file  . Years of education: Not on file  . Highest education level: Not on file  Occupational History  . Occupation: Retired  Scientific laboratory technician  . Financial resource strain: Not on file  . Food insecurity:    Worry: Not on file    Inability: Not on file  . Transportation needs:    Medical: Not on file    Non-medical: Not on file  Tobacco Use  . Smoking status: Never Smoker  . Smokeless tobacco: Never Used  Substance and Sexual Activity  . Alcohol use: No    Alcohol/week: 0.0 standard drinks  . Drug use: No  . Sexual activity: Yes    Partners: Male  Lifestyle  . Physical activity:    Days per week: Not on file    Minutes per session: Not on file  . Stress: Not on file  Relationships  . Social connections:    Talks on phone: Not on file    Gets together: Not on file    Attends religious service: Not on file    Active member of club or organization: Not on file    Attends meetings of clubs or organizations: Not on file    Relationship status: Not on file  . Intimate partner violence:    Fear of current or ex partner: Not on file    Emotionally abused: Not on file    Physically abused: Not on file    Forced sexual activity: Not on file  Other Topics Concern  . Not on file  Social History Narrative   Drinks about 1-2 cups of caffeine daily.    REVIEW OF SYSTEMS: Constitutional: No fevers, chills, or sweats, no generalized fatigue, change in appetite Eyes: No visual changes, double vision, eye pain Ear, nose and throat: No hearing loss, ear pain, nasal congestion, sore throat Cardiovascular: No chest pain, palpitations Respiratory:  No shortness of  breath at rest or with exertion, wheezes GastrointestinaI: No nausea, vomiting, diarrhea, abdominal pain, fecal incontinence Genitourinary:  No dysuria, urinary retention or frequency Musculoskeletal:  Back pain Integumentary: No rash, pruritus, skin lesions Neurological: as above Psychiatric: No depression, insomnia, anxiety Endocrine: No palpitations, fatigue, diaphoresis, mood swings, change in appetite, change in weight, increased thirst Hematologic/Lymphatic:  No purpura, petechiae. Allergic/Immunologic: no itchy/runny eyes, nasal congestion, recent allergic reactions, rashes  PHYSICAL EXAM: Blood pressure 120/60, pulse 77, height 5\' 8"  (1.727 m), weight 248 lb (112.5 kg), SpO2 97 %. General: No acute distress.  Patient appears well-groomed.   Head:  Normocephalic/atraumatic Eyes:  Fundi examined but not visualized Neck: supple, no paraspinal tenderness, full range of motion Heart:  Regular rate and rhythm Lungs:  Clear to auscultation bilaterally Back: No paraspinal tenderness Neurological Exam: alert and oriented to person, place, and time. Attention span and concentration intact, recent and remote memory intact, fund of knowledge intact.  Speech fluent and not dysarthric, language intact.  CN II-XII intact. Bulk and tone normal, muscle strength 4+/5 right hip flexion, otherwise 5/5 throughout.  Sensation to light touch, temperature and vibration intact.  Deep tendon reflexes 2+ throughout, toes downgoing.  Finger to nose testing intact.  Gait wide-based and antalgic with cane, Romberg positive.  IMPRESSION: 1.  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  Feeling of unsteadiness may be secondary to increase to topiramate.  May tolerate ER better.  She would like to switch back to Trokendi  PLAN: 1.  For preventative management, switch topiramate back to Trokendi XR 200mg  at bedtime.  Also, I would like to start  Emgality. 2.  For abortive therapy, lidocaine nasal 3.  Limit use of pain relievers to no more than 2 days out of week to prevent risk of rebound or medication-overuse headache. 4.  Keep headache diary 5.  Exercise, hydration, caffeine cessation, sleep hygiene, monitor for and avoid triggers 6.  Consider:  magnesium citrate 400mg  daily, riboflavin 400mg  daily, and coenzyme Q10 100mg  three times daily 7.  Follow up in 2 months  Metta Clines, DO  CC: Monico Blitz, MD

## 2018-09-30 NOTE — Progress Notes (Signed)
Discharge Progress Report  Patient Details  Name: Jodi Ray MRN: [id number] Date of Birth: 1961/01/27 Referring Provider:     CARDIAC REHAB PHASE II EXERCISE from 06/17/2018 in [place]  Referring Provider  Bent       Number of Visits: 61  Reason for Discharge:  Patient reached a stable level of exercise. Patient independent in their exercise. Patient has met program and personal goals.  Smoking History:  Social History   Tobacco Use  Smoking Status Never Smoker  Smokeless Tobacco Never Used    Diagnosis:  Status post coronary artery stent placement  ADL [facility]SD:   Initial Exercise Prescription: Initial Exercise Prescription - 06/17/18 1400      Date of Initial Exercise RX and Referring Provider   Date  06/17/18    Referring Provider  Koneswaran    Expected Discharge Date  09/17/18      NuStep   Level  1    SPM  102    Minutes  17    METs  2.2      Arm Ergometer   Level  1    Watts  17    RPM  60    Minutes  17    METs  2.1      Prescription Details   Frequency (times per week)  3    Duration  Progress to 30 minutes of continuous aerobic without signs/symptoms of physical distress      Intensity   THRR 40-80% of Max Heartrate  [phone]    Ratings of Perceived Exertion  11-13    Perceived Dyspnea  0-4      Progression   Progression  Continue to progress workloads to maintain intensity without signs/symptoms of physical distress.      Resistance Training   Training Prescription  Yes    Weight  1    Reps  10-15       Discharge Exercise Prescription (Final Exercise Prescription Changes): Exercise Prescription Changes - 09/13/18 1400      Response to Exercise   Blood Pressure (Admit)  130/60    Blood Pressure (Exercise)  170/70    Blood Pressure (Exit)  100/60    Heart Rate (Admit)  81 bpm    Heart Rate (Exercise)  118 bpm    Heart Rate (Exit)  103 bpm    Rating of Perceived Exertion (Exercise)  11     Comments  5 more sessions!     Duration  Continue with 30 min of aerobic exercise without signs/symptoms of physical distress.    Intensity  THRR unchanged      Progression   Progression  Continue to progress workloads to maintain intensity without signs/symptoms of physical distress.    Average METs  3.4      Resistance Training   Training Prescription  Yes    Weight  4    Reps  10-15      NuStep   Level  3    SPM  145    Minutes  22    METs  2.6      Arm Ergometer   Level  3    Watts  10    RPM  70    Minutes  17    METs  4.2      Home Exercise Plan   Plans to continue exercise at  Home (comment)    Frequency  Add 2 additional days to program exercise sessions.  Initial Home Exercises Provided  06/17/18       Functional Capacity: 6 Minute Walk    Row Name 06/17/18 1411 09/30/18 1323       6 Minute Walk   Phase  Initial  Discharge    Distance  900 feet  1000 feet    Distance % Change  -  10 %    Distance Feet Change  -  100 ft    Walk Time  6 minutes  6 minutes    # of Rest Breaks  0  0    MPH  1.7  1.89    METS  2.3  2.45    RPE  7  7    Perceived Dyspnea   9  6    VO2 Peak  9.54  10.5    Symptoms  No  Yes (comment)    Comments  -  6/10 foot pain, subsided with rest     Resting HR  94 bpm  90 bpm    Resting BP  116/60  128/70    Resting Oxygen Saturation   94 %  94 %    Exercise Oxygen Saturation  during 6 min walk  98 %  93 %    Max Ex. HR  134 bpm  121 bpm    Max Ex. BP  162/68  166/74    2 Minute Post BP  120/60  126/68       Psychological, QOL, Others - Outcomes: PHQ 2/9: Depression screen [facility] 2/9 09/26/2018 06/17/2018 04/09/2017 08/04/2015 04/30/2015  Decreased Interest 0 0 0 0 0  Down, Depressed, Hopeless 0 0 0 0 0  PHQ - 2 Score 0 0 0 0 0  Altered sleeping 3 0 - - -  Tired, decreased energy 0 0 - - -  Change in appetite 0 0 - - -  Feeling bad or failure about yourself  0 0 - - -  Trouble concentrating 0 0 - - -  Moving slowly or  fidgety/restless 0 0 - - -  Suicidal thoughts 0 0 - - -  PHQ-9 Score 3 0 - - -  Difficult doing work/chores Not difficult at all Not difficult at all - - -    Quality of Life: Quality of Life - 09/20/18 1533      Quality of Life   Select  Quality of Life      Quality of Life Scores   Health/Function Pre  24.41 %    Health/Function Post  28.5 %    Health/Function % Change  16.76 %    Socioeconomic Pre  20.36 %    Socioeconomic Post  25 %    Socioeconomic % Change   22.79 %    Psych/Spiritual Pre  26.43 %    Psych/Spiritual Post  29.29 %    Psych/Spiritual % Change  10.82 %    Family Pre  30 %    Family Post  25.5 %    Family % Change  -15 %    GLOBAL Pre  24.08 %    GLOBAL Post  27.61 %    GLOBAL % Change  14.66 %       Personal Goals: Goals established at orientation with interventions provided to work toward goal. Personal Goals and Risk Factors at Admission - 06/17/18 1530      Core Components/Risk Factors/Patient Goals on Admission    Weight Management  Yes    Intervention  Weight Management/Obesity: Establish reasonable  short term and long term weight goals.    Admit Weight  261 lb (118.4 kg)    Goal Weight: Short Term  246 lb (111.6 kg)    Goal Weight: Long Term  231 lb (104.8 kg)    Expected Outcomes  Short Term: Continue to assess and modify interventions until short term weight is achieved;Long Term: Adherence to nutrition and physical activity/exercise program aimed toward attainment of established weight goal    Personal Goal Other  Yes    Personal Goal  Be able to walk further and lose 30lbs    Intervention  Attend program 3 x week and supplement with 2 days week at home stretching and hand heald weights.    Expected Outcomes  Reach personal goals.         Personal Goals Discharge: Goals and Risk Factor Review    Row Name 07/18/18 0827 08/08/18 1013 09/05/18 0941 09/26/18 1617       Core Components/Risk Factors/Patient Goals Review   Personal Goals  Review  Weight Management/Obesity;Diabetes Walk further; lose 30 lbs.   Weight Management/Obesity;Diabetes Walk further; lose 30 lbs.   Weight Management/Obesity;Diabetes Walk further; lose 30 lbs.   Weight Management/Obesity;Diabetes Walk further; lose 30 lbs.     Review  Patient has completed 10 sessions maintaining her weight since her initial visit. She is doing well in the program with progression. She was seen in the ED 07/16/18 with a near syncope episode transported by EMS. She was hypotensive and later evaluated by her cardiologist. He adjusted her medication. Her b/p have been stable. Her last A1C on file was 7.8 on 03/20/18. Her fasting reported glucose readings are usually below 120. Will continue to monitor for progress.   Patient has completed 16 sessions losing 10 lbs since last 30 day review. She continues to do well in the program with progression. Her last report A1C was 05/2018 at 7.7 mg/dl. Her reported fasting glucose readings average 115 mg/dl. She is on an insulin pump. She says she has a lot more energy and is able to walk further and is doing more around the house. Will continue to monitor for progress.   Patient has completed 27 sessions losing 1.5 lb since last 30 day review. She continues to do well in the program with progression. She reports her last A1C was 7.2 mg/dl on 06/2018. Her fasting glucose readings she reports are usually >120. She states her energy level has increased and she is able to do more around the house. She continues to suffer from migraine headaches and has seen a neurologist for this. He prescribed Lodocaine 1 spray each nostril prn and increased her Trokendi XR to 200 mg daily. but this has not helped the headaches. He reommended starting O2 if the headaches are not controlled. Will continue to monitor for progress.   Patient graduated with 36 sessions losing 6.8 lbs overall and 3 inches in her waist and 2 inches in her hips. She did very well in the program. Her  exit measurements improved in balance and grip strength. Her exit walk test improved by 10%. She states the program has helped me tremendously. She also says she learned a lot and is able to do a lot more at home. She is more active overall and feels stronger and has more energy. She says she is exercising 6 days/week now and has joined our [employer] program to continue exercising. CR will f/u for one year.     Expected Outcomes  Patient will continue to attend sessions and complete the program meeting her personal goals.   Patient will continue to attend sessions and complete the program meeting her personal goals.   Patient will continue to attend sessions and complete the program meeting her personal goals.   Patient will continue exercising in our [place] [facility] program and continue to meet her personal goals.        Exercise Goals and Review: Exercise Goals    Row Name 06/17/18 1415             Exercise Goals   Increase Physical Activity  Yes       Intervention  Provide advice, education, support and counseling about physical activity/exercise needs.;Develop an individualized exercise prescription for aerobic and resistive training based on initial evaluation findings, risk stratification, comorbidities and participant's personal goals.       Expected Outcomes  Short Term: Attend rehab on a regular basis to increase amount of physical activity.       Increase Strength and Stamina  Yes       Intervention  Provide advice, education, support and counseling about physical activity/exercise needs.;Develop an individualized exercise prescription for aerobic and resistive training based on initial evaluation findings, risk stratification, comorbidities and participant's personal goals.       Expected Outcomes  Short Term: Increase workloads from initial exercise prescription for resistance, speed, and METs.       Able to understand and use rate of perceived exertion (RPE)  scale  Yes       Intervention  Provide education and explanation on how to use RPE scale       Expected Outcomes  Short Term: Able to use RPE daily in rehab to express subjective intensity level;Long Term:  Able to use RPE to guide intensity level when exercising independently       Able to understand and use Dyspnea scale  Yes       Intervention  Provide education and explanation on how to use Dyspnea scale       Expected Outcomes  Short Term: Able to use Dyspnea scale daily in rehab to express subjective sense of shortness of breath during exertion;Long Term: Able to use Dyspnea scale to guide intensity level when exercising independently       Knowledge and understanding of Target Heart Rate Range (THRR)  Yes       Intervention  Provide education and explanation of THRR including how the numbers were predicted and where they are located for reference       Expected Outcomes  Long Term: Able to use THRR to govern intensity when exercising independently;Short Term: Able to state/look up THRR;Short Term: Able to use daily as guideline for intensity in rehab       Able to check pulse independently  Yes       Intervention  Provide education and demonstration on how to check pulse in carotid and radial arteries.;Review the importance of being able to check your own pulse for safety during independent exercise       Expected Outcomes  Short Term: Able to explain why pulse checking is important during independent exercise;Long Term: Able to check pulse independently and accurately       Understanding of Exercise Prescription  Yes       Intervention  Provide education, explanation, and written materials on patient's individual exercise prescription       Expected Outcomes  Short Term: Able to explain program exercise prescription;Long  Term: Able to explain home exercise prescription to exercise independently          Exercise Goals Re-Evaluation: Exercise Goals Re-Evaluation    Row Name 07/16/18 1531  08/06/18 1523 09/03/18 0819         Exercise Goal Re-Evaluation   Exercise Goals Review  Increase Physical Activity;Increase Strength and Stamina;Able to understand and use rate of perceived exertion (RPE) scale;Knowledge and understanding of Target Heart Rate Range (THRR);Able to check pulse independently;Understanding of Exercise Prescription;Improve claudication pain tolerance and improve walking ability  Increase Physical Activity;Increase Strength and Stamina;Able to understand and use rate of perceived exertion (RPE) scale;Knowledge and understanding of Target Heart Rate Range (THRR);Able to check pulse independently;Understanding of Exercise Prescription;Improve claudication pain tolerance and improve walking ability  Increase Physical Activity;Increase Strength and Stamina;Able to understand and use rate of perceived exertion (RPE) scale;Knowledge and understanding of Target Heart Rate Range (THRR);Able to check pulse independently;Understanding of Exercise Prescription;Improve claudication pain tolerance and improve walking ability     Comments  Pt. has done well in the program so far, she has attended 9 sessions. She has tolerated the exercise and progressions well and is eager to come in, work hard and push herself.   Pt. continues to do well in CR. She always comes to class ready to work and pushes herself on both pieces of equipment in order to increase her distance and overall MET level.   Pt. is doing great in CR. She pushes herself hard every session to get better and get farther on the equipment than she did before. She has had some set backs with headaches but hasn't let that stop her from exercising.      Expected Outcomes  Improve walking, walk without pain, lose weight.   Improve walking, walk without pain, lose weight.   Improve walking, walk without pain, lose weight.         Nutrition & Weight - Outcomes: Pre Biometrics - 06/17/18 1416      Pre Biometrics   Height  5' 8" (1.727  m)    Waist Circumference  44 inches    Hip Circumference  54 inches    Waist to Hip Ratio  0.81 %    Triceps Skinfold  23 mm    % Body Fat  46 %    Grip Strength  17.5 kg    Flexibility  0 in    Single Leg Stand  1.5 seconds        Nutrition: Nutrition Therapy & Goals - 09/05/18 0940      Nutrition Therapy   RD appointment deferred  Yes      Personal Nutrition Goals   Comments  Patient was reminded of RD appointment tomorrow. She continues to says she is following a diabetic diet. Will continue to monitor for progress.       Intervention Plan   Intervention  Nutrition handout(s) given to patient.       Nutrition Discharge: Nutrition Assessments - 09/26/18 1600      MEDFICTS Scores   Pre Score  6    Post Score  3    Score Difference  -3       Education Questionnaire Score: Knowledge Questionnaire Score - 09/26/18 1559      Knowledge Questionnaire Score   Pre Score  23/24    Post Score  24/24       Goals reviewed with patient; copy given to patient.

## 2018-10-02 ENCOUNTER — Ambulatory Visit (INDEPENDENT_AMBULATORY_CARE_PROVIDER_SITE_OTHER): Payer: Medicare Other | Admitting: Neurology

## 2018-10-02 ENCOUNTER — Encounter: Payer: Self-pay | Admitting: Neurology

## 2018-10-02 VITALS — BP 120/60 | HR 77 | Ht 68.0 in | Wt 248.0 lb

## 2018-10-02 DIAGNOSIS — G44001 Cluster headache syndrome, unspecified, intractable: Secondary | ICD-10-CM

## 2018-10-02 DIAGNOSIS — J3089 Other allergic rhinitis: Secondary | ICD-10-CM | POA: Diagnosis not present

## 2018-10-02 DIAGNOSIS — I25118 Atherosclerotic heart disease of native coronary artery with other forms of angina pectoris: Secondary | ICD-10-CM

## 2018-10-02 DIAGNOSIS — J3081 Allergic rhinitis due to animal (cat) (dog) hair and dander: Secondary | ICD-10-CM | POA: Diagnosis not present

## 2018-10-02 DIAGNOSIS — J301 Allergic rhinitis due to pollen: Secondary | ICD-10-CM | POA: Diagnosis not present

## 2018-10-02 MED ORDER — GALCANEZUMAB-GNLM 120 MG/ML ~~LOC~~ SOAJ: 240.0000 mg | Freq: Once | SUBCUTANEOUS | 0 refills | Status: AC

## 2018-10-02 MED ORDER — TOPIRAMATE ER 200 MG PO CAP24: 200.0000 mg | ORAL_CAPSULE | Freq: Every day | ORAL | 3 refills | Status: DC

## 2018-10-02 MED ORDER — GALCANEZUMAB-GNLM 120 MG/ML ~~LOC~~ SOAJ: 120.0000 mg | SUBCUTANEOUS | 11 refills | Status: DC

## 2018-10-02 NOTE — Patient Instructions (Signed)
1.  We will switch topiramate back to Trokendi XR 200mg  at bedtime 2.  We will start Emgality.   3.  Continue use of lidocaine nasal for severe headache attacks. 4.  Limit use of pain relievers to no more than 2 days out of week to prevent risk of rebound or medication-overuse headache. 5.  Follow up in 2 months.

## 2018-10-11 DIAGNOSIS — J301 Allergic rhinitis due to pollen: Secondary | ICD-10-CM | POA: Diagnosis not present

## 2018-10-11 DIAGNOSIS — J3081 Allergic rhinitis due to animal (cat) (dog) hair and dander: Secondary | ICD-10-CM | POA: Diagnosis not present

## 2018-10-11 DIAGNOSIS — J3089 Other allergic rhinitis: Secondary | ICD-10-CM | POA: Diagnosis not present

## 2018-10-16 DIAGNOSIS — J3081 Allergic rhinitis due to animal (cat) (dog) hair and dander: Secondary | ICD-10-CM | POA: Diagnosis not present

## 2018-10-16 DIAGNOSIS — J301 Allergic rhinitis due to pollen: Secondary | ICD-10-CM | POA: Diagnosis not present

## 2018-10-16 DIAGNOSIS — J3089 Other allergic rhinitis: Secondary | ICD-10-CM | POA: Diagnosis not present

## 2018-10-18 ENCOUNTER — Other Ambulatory Visit: Payer: Self-pay

## 2018-10-18 MED ORDER — FUROSEMIDE 20 MG PO TABS: 20.0000 mg | ORAL_TABLET | ORAL | 3 refills | Status: DC | PRN

## 2018-10-18 NOTE — Telephone Encounter (Signed)
refilled lasix

## 2018-10-21 DIAGNOSIS — J3081 Allergic rhinitis due to animal (cat) (dog) hair and dander: Secondary | ICD-10-CM | POA: Diagnosis not present

## 2018-10-21 DIAGNOSIS — J3089 Other allergic rhinitis: Secondary | ICD-10-CM | POA: Diagnosis not present

## 2018-10-21 DIAGNOSIS — J301 Allergic rhinitis due to pollen: Secondary | ICD-10-CM | POA: Diagnosis not present

## 2018-10-22 DIAGNOSIS — I10 Essential (primary) hypertension: Secondary | ICD-10-CM | POA: Diagnosis not present

## 2018-10-22 DIAGNOSIS — Z6839 Body mass index (BMI) 39.0-39.9, adult: Secondary | ICD-10-CM | POA: Diagnosis not present

## 2018-10-22 DIAGNOSIS — E1165 Type 2 diabetes mellitus with hyperglycemia: Secondary | ICD-10-CM | POA: Diagnosis not present

## 2018-10-22 DIAGNOSIS — Z299 Encounter for prophylactic measures, unspecified: Secondary | ICD-10-CM | POA: Diagnosis not present

## 2018-10-22 DIAGNOSIS — T7840XA Allergy, unspecified, initial encounter: Secondary | ICD-10-CM | POA: Diagnosis not present

## 2018-10-22 DIAGNOSIS — K219 Gastro-esophageal reflux disease without esophagitis: Secondary | ICD-10-CM | POA: Diagnosis not present

## 2018-10-24 DIAGNOSIS — M159 Polyosteoarthritis, unspecified: Secondary | ICD-10-CM | POA: Diagnosis not present

## 2018-10-24 DIAGNOSIS — E119 Type 2 diabetes mellitus without complications: Secondary | ICD-10-CM | POA: Diagnosis not present

## 2018-10-24 DIAGNOSIS — I10 Essential (primary) hypertension: Secondary | ICD-10-CM | POA: Diagnosis not present

## 2018-10-24 DIAGNOSIS — E113593 Type 2 diabetes mellitus with proliferative diabetic retinopathy without macular edema, bilateral: Secondary | ICD-10-CM | POA: Diagnosis not present

## 2018-11-04 DIAGNOSIS — E1140 Type 2 diabetes mellitus with diabetic neuropathy, unspecified: Secondary | ICD-10-CM | POA: Diagnosis not present

## 2018-11-04 DIAGNOSIS — E1151 Type 2 diabetes mellitus with diabetic peripheral angiopathy without gangrene: Secondary | ICD-10-CM | POA: Diagnosis not present

## 2018-11-06 DIAGNOSIS — J301 Allergic rhinitis due to pollen: Secondary | ICD-10-CM | POA: Diagnosis not present

## 2018-11-11 DIAGNOSIS — J301 Allergic rhinitis due to pollen: Secondary | ICD-10-CM | POA: Diagnosis not present

## 2018-11-13 DIAGNOSIS — J301 Allergic rhinitis due to pollen: Secondary | ICD-10-CM | POA: Diagnosis not present

## 2018-11-14 DIAGNOSIS — Z1211 Encounter for screening for malignant neoplasm of colon: Secondary | ICD-10-CM | POA: Diagnosis not present

## 2018-11-14 DIAGNOSIS — E039 Hypothyroidism, unspecified: Secondary | ICD-10-CM | POA: Diagnosis not present

## 2018-11-14 DIAGNOSIS — Z1331 Encounter for screening for depression: Secondary | ICD-10-CM | POA: Diagnosis not present

## 2018-11-14 DIAGNOSIS — R5383 Other fatigue: Secondary | ICD-10-CM | POA: Diagnosis not present

## 2018-11-14 DIAGNOSIS — Z1339 Encounter for screening examination for other mental health and behavioral disorders: Secondary | ICD-10-CM | POA: Diagnosis not present

## 2018-11-14 DIAGNOSIS — Z299 Encounter for prophylactic measures, unspecified: Secondary | ICD-10-CM | POA: Diagnosis not present

## 2018-11-14 DIAGNOSIS — Z0000 Encounter for general adult medical examination without abnormal findings: Secondary | ICD-10-CM | POA: Diagnosis not present

## 2018-11-14 DIAGNOSIS — E7800 Pure hypercholesterolemia, unspecified: Secondary | ICD-10-CM | POA: Diagnosis not present

## 2018-11-14 DIAGNOSIS — E1165 Type 2 diabetes mellitus with hyperglycemia: Secondary | ICD-10-CM | POA: Diagnosis not present

## 2018-11-14 DIAGNOSIS — Z6838 Body mass index (BMI) 38.0-38.9, adult: Secondary | ICD-10-CM | POA: Diagnosis not present

## 2018-11-14 DIAGNOSIS — Z7189 Other specified counseling: Secondary | ICD-10-CM | POA: Diagnosis not present

## 2018-11-15 DIAGNOSIS — E559 Vitamin D deficiency, unspecified: Secondary | ICD-10-CM | POA: Diagnosis not present

## 2018-11-15 DIAGNOSIS — R5383 Other fatigue: Secondary | ICD-10-CM | POA: Diagnosis not present

## 2018-11-15 DIAGNOSIS — Z79899 Other long term (current) drug therapy: Secondary | ICD-10-CM | POA: Diagnosis not present

## 2018-11-15 DIAGNOSIS — J301 Allergic rhinitis due to pollen: Secondary | ICD-10-CM | POA: Diagnosis not present

## 2018-11-15 DIAGNOSIS — E039 Hypothyroidism, unspecified: Secondary | ICD-10-CM | POA: Diagnosis not present

## 2018-11-15 DIAGNOSIS — E7800 Pure hypercholesterolemia, unspecified: Secondary | ICD-10-CM | POA: Diagnosis not present

## 2018-11-18 DIAGNOSIS — J301 Allergic rhinitis due to pollen: Secondary | ICD-10-CM | POA: Diagnosis not present

## 2018-11-20 DIAGNOSIS — J301 Allergic rhinitis due to pollen: Secondary | ICD-10-CM | POA: Diagnosis not present

## 2018-11-21 DIAGNOSIS — E113593 Type 2 diabetes mellitus with proliferative diabetic retinopathy without macular edema, bilateral: Secondary | ICD-10-CM | POA: Diagnosis not present

## 2018-11-22 DIAGNOSIS — J301 Allergic rhinitis due to pollen: Secondary | ICD-10-CM | POA: Diagnosis not present

## 2018-11-25 DIAGNOSIS — J301 Allergic rhinitis due to pollen: Secondary | ICD-10-CM | POA: Diagnosis not present

## 2018-11-27 DIAGNOSIS — J301 Allergic rhinitis due to pollen: Secondary | ICD-10-CM | POA: Diagnosis not present

## 2018-11-28 ENCOUNTER — Encounter: Payer: Self-pay | Admitting: Neurology

## 2018-11-29 DIAGNOSIS — J301 Allergic rhinitis due to pollen: Secondary | ICD-10-CM | POA: Diagnosis not present

## 2018-12-02 DIAGNOSIS — J301 Allergic rhinitis due to pollen: Secondary | ICD-10-CM | POA: Diagnosis not present

## 2018-12-03 ENCOUNTER — Other Ambulatory Visit (HOSPITAL_COMMUNITY)
Admission: RE | Admit: 2018-12-03 | Discharge: 2018-12-03 | Disposition: A | Discharge status: Home / Self Care | Payer: Medicare Other | Source: Ambulatory Visit | Attending: Cardiovascular Disease | Admitting: Cardiovascular Disease

## 2018-12-03 ENCOUNTER — Telehealth: Payer: Self-pay | Admitting: Cardiovascular Disease

## 2018-12-03 ENCOUNTER — Telehealth: Payer: Self-pay | Admitting: *Deleted

## 2018-12-03 DIAGNOSIS — I2510 Atherosclerotic heart disease of native coronary artery without angina pectoris: Secondary | ICD-10-CM | POA: Insufficient documentation

## 2018-12-03 DIAGNOSIS — I2583 Coronary atherosclerosis due to lipid rich plaque: Principal | ICD-10-CM

## 2018-12-03 DIAGNOSIS — R031 Nonspecific low blood-pressure reading: Secondary | ICD-10-CM

## 2018-12-03 DIAGNOSIS — R531 Weakness: Secondary | ICD-10-CM | POA: Insufficient documentation

## 2018-12-03 DIAGNOSIS — R42 Dizziness and giddiness: Secondary | ICD-10-CM

## 2018-12-03 LAB — BASIC METABOLIC PANEL
Anion gap: 9 (ref 5–15)
BUN: 38 mg/dL — ABNORMAL HIGH (ref 6–20)
CO2: 23 mmol/L (ref 22–32)
Calcium: 9.8 mg/dL (ref 8.9–10.3)
Chloride: 108 mmol/L (ref 98–111)
Creatinine, Ser: 1.19 mg/dL — ABNORMAL HIGH (ref 0.44–1.00)
GFR calc Af Amer: 57 mL/min — ABNORMAL LOW (ref >60)
GFR calc non Af Amer: 50 mL/min — ABNORMAL LOW (ref >60)
Glucose, Bld: 147 mg/dL — ABNORMAL HIGH (ref 70–99)
Potassium: 4.5 mmol/L (ref 3.5–5.1)
Sodium: 140 mmol/L (ref 135–145)

## 2018-12-03 LAB — CBC
HCT: 40.2 % (ref 36.0–46.0)
Hemoglobin: 12.5 g/dL (ref 12.0–15.0)
MCH: 29.4 pg (ref 26.0–34.0)
MCHC: 31.1 g/dL (ref 30.0–36.0)
MCV: 94.6 fL (ref 80.0–100.0)
Platelets: 208 10*3/uL (ref 150–400)
RBC: 4.25 MIL/uL (ref 3.87–5.11)
RDW: 13.7 % (ref 11.5–15.5)
WBC: 4.5 10*3/uL (ref 4.0–10.5)
nRBC: 0 % (ref 0.0–0.2)

## 2018-12-03 LAB — TSH: TSH: 0.647 u[IU]/mL (ref 0.350–4.500)

## 2018-12-03 NOTE — Telephone Encounter (Signed)
Pt has only started checking BP over the last 4 days but has had symptoms over the last 2 weeks. Denies any fever/chills/upper respiratory symptoms. Weight loss was intentional - pt will hold lisinopril and carvedilol and have labs done at [facility] today

## 2018-12-03 NOTE — Telephone Encounter (Signed)
Low BP can also cause this lab appearance.

## 2018-12-03 NOTE — Telephone Encounter (Signed)
BP when pt was having symptoms over the last 4 days (home BP monitor doesn't record HR and pt doesn't know what HR has been running) 78/44 72/56 83/50  78/49 77/44 73/56  78/52 72/56 73/44  68/43 79/56 73/55  72/63 68/49 - pt says last couple of weeks when moving from sitting to standing has been getting dizzy for a few minutes and also feeling increasingly weak - called [facility] last night and they suggested elevating her feet above her head which brought BP to 90/56 and also suggested pt come in for evaluation which pt declined to do with current pandemic. This morning check BP again and was 78/56. Pt denies weight gain (actually lost 16lbs) SOB/chest pain - says she has some swelling in her feet but wears compression stockings for this. Pt denies any recent medication changes.

## 2018-12-03 NOTE — Telephone Encounter (Signed)
Patient called stating that last evening while eating dinner she became very light headed. States that her BP is running low.

## 2018-12-03 NOTE — Telephone Encounter (Signed)
-----   Message from Herminio Commons, MD sent at 12/03/2018 11:41 AM EDT ----- Appears dehydrated based on labs. Tell her not to take Lasix and to increase water intake which will help improve BP and kidney function (which is mildly impaired).as

## 2018-12-03 NOTE — Telephone Encounter (Signed)
Pt aware and says she hasn't taken lasix in 3 weeks and has been drinking 1 gallon of water daily (to aide in losing weight) pt wanted to know if anything else could be causing labs to show dehydration

## 2018-12-03 NOTE — Telephone Encounter (Signed)
Unclear whether there has been a sudden change in blood pressure.  She having any upper respiratory symptoms or a fever?  Did she intentionally lose 16 pounds or was this unintentional weight loss?  Have her stop lisinopril and carvedilol for now.  Please get a CBC, TSH, and basic metabolic panel

## 2018-12-04 DIAGNOSIS — J301 Allergic rhinitis due to pollen: Secondary | ICD-10-CM | POA: Diagnosis not present

## 2018-12-04 NOTE — Telephone Encounter (Signed)
Pt voiced understanding - will continue to monitor BP over the next couple of weeks with stopping carvedilol and lisinopril and update [place].

## 2018-12-05 ENCOUNTER — Encounter

## 2018-12-05 ENCOUNTER — Ambulatory Visit: Payer: Medicare Other | Admitting: Neurology

## 2018-12-06 DIAGNOSIS — J301 Allergic rhinitis due to pollen: Secondary | ICD-10-CM | POA: Diagnosis not present

## 2018-12-09 DIAGNOSIS — J301 Allergic rhinitis due to pollen: Secondary | ICD-10-CM | POA: Diagnosis not present

## 2018-12-16 DIAGNOSIS — J301 Allergic rhinitis due to pollen: Secondary | ICD-10-CM | POA: Diagnosis not present

## 2018-12-18 DIAGNOSIS — J301 Allergic rhinitis due to pollen: Secondary | ICD-10-CM | POA: Diagnosis not present

## 2018-12-20 DIAGNOSIS — E113593 Type 2 diabetes mellitus with proliferative diabetic retinopathy without macular edema, bilateral: Secondary | ICD-10-CM | POA: Diagnosis not present

## 2018-12-20 DIAGNOSIS — J301 Allergic rhinitis due to pollen: Secondary | ICD-10-CM | POA: Diagnosis not present

## 2018-12-23 DIAGNOSIS — J301 Allergic rhinitis due to pollen: Secondary | ICD-10-CM | POA: Diagnosis not present

## 2018-12-25 DIAGNOSIS — J301 Allergic rhinitis due to pollen: Secondary | ICD-10-CM | POA: Diagnosis not present

## 2018-12-26 DIAGNOSIS — E119 Type 2 diabetes mellitus without complications: Secondary | ICD-10-CM | POA: Diagnosis not present

## 2018-12-26 DIAGNOSIS — M159 Polyosteoarthritis, unspecified: Secondary | ICD-10-CM | POA: Diagnosis not present

## 2018-12-26 DIAGNOSIS — I10 Essential (primary) hypertension: Secondary | ICD-10-CM | POA: Diagnosis not present

## 2018-12-27 DIAGNOSIS — J301 Allergic rhinitis due to pollen: Secondary | ICD-10-CM | POA: Diagnosis not present

## 2018-12-30 DIAGNOSIS — J301 Allergic rhinitis due to pollen: Secondary | ICD-10-CM | POA: Diagnosis not present

## 2019-01-01 DIAGNOSIS — J301 Allergic rhinitis due to pollen: Secondary | ICD-10-CM | POA: Diagnosis not present

## 2019-01-03 DIAGNOSIS — J301 Allergic rhinitis due to pollen: Secondary | ICD-10-CM | POA: Diagnosis not present

## 2019-01-06 DIAGNOSIS — J301 Allergic rhinitis due to pollen: Secondary | ICD-10-CM | POA: Diagnosis not present

## 2019-01-10 DIAGNOSIS — J301 Allergic rhinitis due to pollen: Secondary | ICD-10-CM | POA: Diagnosis not present

## 2019-01-13 DIAGNOSIS — J301 Allergic rhinitis due to pollen: Secondary | ICD-10-CM | POA: Diagnosis not present

## 2019-01-15 DIAGNOSIS — J301 Allergic rhinitis due to pollen: Secondary | ICD-10-CM | POA: Diagnosis not present

## 2019-01-20 DIAGNOSIS — J301 Allergic rhinitis due to pollen: Secondary | ICD-10-CM | POA: Diagnosis not present

## 2019-01-22 DIAGNOSIS — J301 Allergic rhinitis due to pollen: Secondary | ICD-10-CM | POA: Diagnosis not present

## 2019-01-24 DIAGNOSIS — I10 Essential (primary) hypertension: Secondary | ICD-10-CM | POA: Diagnosis not present

## 2019-01-24 DIAGNOSIS — J301 Allergic rhinitis due to pollen: Secondary | ICD-10-CM | POA: Diagnosis not present

## 2019-01-24 DIAGNOSIS — M159 Polyosteoarthritis, unspecified: Secondary | ICD-10-CM | POA: Diagnosis not present

## 2019-01-24 DIAGNOSIS — E119 Type 2 diabetes mellitus without complications: Secondary | ICD-10-CM | POA: Diagnosis not present

## 2019-01-27 DIAGNOSIS — J301 Allergic rhinitis due to pollen: Secondary | ICD-10-CM | POA: Diagnosis not present

## 2019-01-29 DIAGNOSIS — J301 Allergic rhinitis due to pollen: Secondary | ICD-10-CM | POA: Diagnosis not present

## 2019-01-31 DIAGNOSIS — J301 Allergic rhinitis due to pollen: Secondary | ICD-10-CM | POA: Diagnosis not present

## 2019-02-03 DIAGNOSIS — J301 Allergic rhinitis due to pollen: Secondary | ICD-10-CM | POA: Diagnosis not present

## 2019-02-05 DIAGNOSIS — J301 Allergic rhinitis due to pollen: Secondary | ICD-10-CM | POA: Diagnosis not present

## 2019-02-10 ENCOUNTER — Ambulatory Visit: Payer: Medicare Other | Admitting: Neurology

## 2019-02-10 ENCOUNTER — Encounter

## 2019-02-10 DIAGNOSIS — J301 Allergic rhinitis due to pollen: Secondary | ICD-10-CM | POA: Diagnosis not present

## 2019-02-13 DIAGNOSIS — J301 Allergic rhinitis due to pollen: Secondary | ICD-10-CM | POA: Diagnosis not present

## 2019-02-17 ENCOUNTER — Ambulatory Visit: Payer: Medicare Other | Admitting: Neurology

## 2019-02-17 DIAGNOSIS — J301 Allergic rhinitis due to pollen: Secondary | ICD-10-CM | POA: Diagnosis not present

## 2019-02-19 DIAGNOSIS — J301 Allergic rhinitis due to pollen: Secondary | ICD-10-CM | POA: Diagnosis not present

## 2019-02-21 ENCOUNTER — Encounter (HOSPITAL_COMMUNITY): Payer: Self-pay | Admitting: Emergency Medicine

## 2019-02-21 ENCOUNTER — Emergency Department (HOSPITAL_COMMUNITY): Payer: Medicare Other

## 2019-02-21 ENCOUNTER — Telehealth: Payer: Self-pay

## 2019-02-21 ENCOUNTER — Other Ambulatory Visit: Payer: Self-pay

## 2019-02-21 ENCOUNTER — Emergency Department (HOSPITAL_COMMUNITY)
Admission: EM | Admit: 2019-02-21 | Discharge: 2019-02-21 | Disposition: A | Discharge status: Home / Self Care | Payer: Medicare Other | Attending: Emergency Medicine | Admitting: Emergency Medicine

## 2019-02-21 DIAGNOSIS — E039 Hypothyroidism, unspecified: Secondary | ICD-10-CM | POA: Insufficient documentation

## 2019-02-21 DIAGNOSIS — Z8679 Personal history of other diseases of the circulatory system: Secondary | ICD-10-CM | POA: Diagnosis not present

## 2019-02-21 DIAGNOSIS — I2510 Atherosclerotic heart disease of native coronary artery without angina pectoris: Secondary | ICD-10-CM | POA: Diagnosis not present

## 2019-02-21 DIAGNOSIS — E109 Type 1 diabetes mellitus without complications: Secondary | ICD-10-CM | POA: Insufficient documentation

## 2019-02-21 DIAGNOSIS — Z7982 Long term (current) use of aspirin: Secondary | ICD-10-CM | POA: Diagnosis not present

## 2019-02-21 DIAGNOSIS — Z79899 Other long term (current) drug therapy: Secondary | ICD-10-CM | POA: Insufficient documentation

## 2019-02-21 DIAGNOSIS — Z9682 Presence of neurostimulator: Secondary | ICD-10-CM | POA: Diagnosis not present

## 2019-02-21 DIAGNOSIS — Z7902 Long term (current) use of antithrombotics/antiplatelets: Secondary | ICD-10-CM | POA: Insufficient documentation

## 2019-02-21 DIAGNOSIS — R079 Chest pain, unspecified: Secondary | ICD-10-CM | POA: Diagnosis present

## 2019-02-21 DIAGNOSIS — R0789 Other chest pain: Secondary | ICD-10-CM

## 2019-02-21 DIAGNOSIS — Z96651 Presence of right artificial knee joint: Secondary | ICD-10-CM | POA: Diagnosis not present

## 2019-02-21 DIAGNOSIS — J301 Allergic rhinitis due to pollen: Secondary | ICD-10-CM | POA: Diagnosis not present

## 2019-02-21 DIAGNOSIS — I10 Essential (primary) hypertension: Secondary | ICD-10-CM | POA: Insufficient documentation

## 2019-02-21 DIAGNOSIS — R072 Precordial pain: Secondary | ICD-10-CM | POA: Diagnosis not present

## 2019-02-21 DIAGNOSIS — I700 Atherosclerosis of aorta: Secondary | ICD-10-CM | POA: Diagnosis not present

## 2019-02-21 LAB — CBC WITH DIFFERENTIAL/PLATELET
Abs Immature Granulocytes: 0.01 10*3/uL (ref 0.00–0.07)
Basophils Absolute: 0.1 10*3/uL (ref 0.0–0.1)
Basophils Relative: 1 %
Eosinophils Absolute: 0.2 10*3/uL (ref 0.0–0.5)
Eosinophils Relative: 4 %
HCT: 40.8 % (ref 36.0–46.0)
Hemoglobin: 12.8 g/dL (ref 12.0–15.0)
Immature Granulocytes: 0 %
Lymphocytes Relative: 27 %
Lymphs Abs: 1.6 10*3/uL (ref 0.7–4.0)
MCH: 29.9 pg (ref 26.0–34.0)
MCHC: 31.4 g/dL (ref 30.0–36.0)
MCV: 95.3 fL (ref 80.0–100.0)
Monocytes Absolute: 0.5 10*3/uL (ref 0.1–1.0)
Monocytes Relative: 9 %
Neutro Abs: 3.4 10*3/uL (ref 1.7–7.7)
Neutrophils Relative %: 59 %
Platelets: 218 10*3/uL (ref 150–400)
RBC: 4.28 MIL/uL (ref 3.87–5.11)
RDW: 13.1 % (ref 11.5–15.5)
WBC: 5.8 10*3/uL (ref 4.0–10.5)
nRBC: 0 % (ref 0.0–0.2)

## 2019-02-21 LAB — COMPREHENSIVE METABOLIC PANEL
ALT: 32 U/L (ref 0–44)
AST: 20 U/L (ref 15–41)
Albumin: 4.2 g/dL (ref 3.5–5.0)
Alkaline Phosphatase: 76 U/L (ref 38–126)
Anion gap: 8 (ref 5–15)
BUN: 36 mg/dL — ABNORMAL HIGH (ref 6–20)
CO2: 27 mmol/L (ref 22–32)
Calcium: 9.5 mg/dL (ref 8.9–10.3)
Chloride: 105 mmol/L (ref 98–111)
Creatinine, Ser: 1 mg/dL (ref 0.44–1.00)
GFR calc Af Amer: >60 mL/min (ref >60)
GFR calc non Af Amer: >60 mL/min (ref >60)
Glucose, Bld: 168 mg/dL — ABNORMAL HIGH (ref 70–99)
Potassium: 4.6 mmol/L (ref 3.5–5.1)
Sodium: 140 mmol/L (ref 135–145)
Total Bilirubin: 0.6 mg/dL (ref 0.3–1.2)
Total Protein: 7.3 g/dL (ref 6.5–8.1)

## 2019-02-21 LAB — TROPONIN I (HIGH SENSITIVITY)
Troponin I (High Sensitivity): 5 ng/L (ref <18)
Troponin I (High Sensitivity): 5 ng/L (ref <18)

## 2019-02-21 NOTE — ED Provider Notes (Signed)
[facility] EMERGENCY DEPARTMENT Provider Note   CSN: [id number] Arrival date & time: 02/21/19  1310     History   Chief Complaint Chief Complaint  Patient presents with  . Hypertension    HPI Jodi Ray is a 58 y.o. female.     Patient has a history of coronary artery disease and she had a stent placed last year.  She was over at cardiac rehab and her blood pressure was over 200 and she was sent over to Dr. Court Joy office.  She was seen by the PA and she told the PA that she had some chest tightness for a few minutes and some shortness of breath.  So she was probably sent to the emergency department  The history is provided by the patient. No language interpreter was used.  Chest Pain Pain location:  Substernal area Pain quality: aching   Pain radiates to:  Does not radiate Pain severity:  Mild Onset quality:  Sudden Timing:  Intermittent Progression:  Improving Chronicity:  New Context: breathing   Associated symptoms: no abdominal pain, no back pain, no cough, no fatigue and no headache     Past Medical History:  Diagnosis Date  . Adult RDS ([place])   . Anemia   . Brain aneurysm    frontal lobe  . CAD (coronary artery disease)    a. 02/2018: s/p DES to Proximal LAD with residual 20% RCA stenosis.  . Chronic pain   . Diabetes mellitus without complication ([place])   . Headache   . History of left bundle branch block (LBBB)   . HTN (hypertension)   . Hx of cardiovascular stress test 10/2016   intermediate risk study  . Hypothyroidism   . Neuromuscular disorder ([place])   . Neuropathy   . Obesity   . OSA on CPAP 03/10/2015  . Retinopathy   . Sleep apnea   . Varicose veins of both lower extremities     Patient Active Problem List   Diagnosis Date Noted  . CAD (coronary artery disease) 04/16/2018  . Unstable angina ([place]) 03/21/2018  . Chest pain 03/20/2018  . Gastritis and gastroduodenitis 03/05/2017  . Suprapubic pain 03/05/2017  . Right sided  abdominal pain 10/24/2016  . Diarrhea 10/24/2016  . Family hx of colon cancer 10/24/2016  . Varicose veins of bilateral lower extremities with other complications 63/14/9702  . 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 Type I diabetes mellitus, uncontrolled ([place]) 04/30/2015  . Mixed hyperlipidemia 04/30/2015  . Primary hypothyroidism 04/30/2015  . Benign hypertension 04/30/2015  . OSA on CPAP 03/10/2015  . Migraine without aura and without status migrainosus, not intractable 01/08/2015  . Primary stabbing headache 10/02/2014    Past Surgical History:  Procedure Laterality Date  . BIOPSY  11/14/2016   Procedure: BIOPSY;  Surgeon: Danie Binder, MD;  [facility];  Service: Endoscopy;;  colon gastric duodenal  . BREAST SURGERY Left 1994   Lumpectomy  . COLONOSCOPY WITH PROPOFOL N/A 11/14/2016   Dr. Oneida Alar: Moderately redundant rectosigmoid colon. Random colon biopsies benign. Internal hemorrhoids. Surveillance colonoscopy in 5 years.  . CORONARY STENT INTERVENTION N/A 03/22/2018   Procedure: CORONARY STENT INTERVENTION;  Surgeon: Burnell Blanks, MD;  Location: [place] [facility];  Service: Cardiovascular;  Laterality: N/A;  . ESOPHAGOGASTRODUODENOSCOPY (EGD) WITH PROPOFOL N/A 11/14/2016   Dr. Oneida Alar: Esophagus normal. Moderate gastritis, few gastric polyps. Duodenal biopsies negative. Fundic gland polyp gastric polyp area no H pylori.  Marland Kitchen FOOT SURGERY    .  LEFT HEART CATH AND CORONARY ANGIOGRAPHY N/A 03/22/2018   Procedure: LEFT HEART CATH AND CORONARY ANGIOGRAPHY;  Surgeon: Burnell Blanks, MD;  Location: [place] [facility];  Service: Cardiovascular;  Laterality: N/A;  . LEG SURGERY Right   . REPLACEMENT TOTAL KNEE Right   . SPINAL CORD STIMULATOR IMPLANT       OB History    Gravida      Para      Term      Preterm      AB      Living  0     SAB      TAB      Ectopic      Multiple      Live Births               Home Medications     Prior to Admission medications   Medication Sig Start Date End Date Taking? Authorizing Provider  aspirin EC 81 MG tablet Take 81 mg by mouth daily.    [provider]  atorvastatin (LIPITOR) 80 MG tablet Take 1 tablet (80 mg total) by mouth daily at 6 PM. 04/16/18   Herminio Commons, MD  Cholecalciferol (VITAMIN D-3) 5000 units TABS Take 5,000 Units by mouth daily.     [provider]  clopidogrel (PLAVIX) 75 MG tablet Take 1 tablet (75 mg total) by mouth daily with breakfast. 03/27/18   Ahmed Prima, Fransisco Hertz, PA-C  EPINEPHrine 0.3 mg/0.3 mL IJ SOAJ injection Inject 0.3 mg into the muscle as needed. 04/24/18   [provider]  Fluocinolone Acetonide (DERMOTIC) 0.01 % OIL Place 1 drop into both ears at bedtime.     [provider]  furosemide (LASIX) 20 MG tablet Take 1 tablet (20 mg total) by mouth as needed for up to 30 days. 10/18/18 11/17/18  Herminio Commons, MD  gabapentin (NEURONTIN) 400 MG capsule Take 400 mg by mouth at bedtime. 08/16/18   [provider]  gabapentin (NEURONTIN) 800 MG tablet Take 800 mg by mouth at bedtime. 08/22/18   [provider]  Galcanezumab-gnlm (EMGALITY) 120 MG/ML SOAJ Inject 120 mg into the skin every 30 (thirty) days. 10/02/18   Pieter Partridge, DO  Insulin Human (INSULIN PUMP) SOLN Inject into the skin continuous.     [provider]  levocetirizine (XYZAL) 5 MG tablet Take 5 mg by mouth daily with breakfast.    [provider]  levothyroxine (SYNTHROID, LEVOTHROID) 125 MCG tablet Take 125 mcg by mouth daily before breakfast.  11/01/16   [provider]  metFORMIN (GLUCOPHAGE) 500 MG tablet Take 1 tablet (500 mg total) by mouth 2 (two) times daily with a meal. Do not take 03/23/2018 and 03/24/2018. Restart taking from 03/25/2018. Patient taking differently: Take 500 mg by mouth 2 (two) times daily with a meal.  03/23/18   Hongalgi, Lenis Dickinson, MD  Multiple Vitamins-Minerals (CENTRUM SILVER  50+WOMEN) TABS Take 1 tablet by mouth daily.     [provider]  nitroGLYCERIN (NITROSTAT) 0.4 MG SL tablet Place 1 tablet (0.4 mg total) under the tongue every 5 (five) minutes as needed for chest pain. 04/17/18   Strader, Fransisco Hertz, PA-C  NOVOLOG 100 UNIT/ML injection Inject into the skin continuous.  02/24/18   [provider]  Omega-3 Fatty Acids (FISH OIL) 1000 MG CAPS Take 1,000 mg by mouth daily.    [provider]  pantoprazole (PROTONIX) 40 MG tablet Take 1 tablet (40 mg total)  by mouth 2 (two) times daily before a meal. 03/23/18 09/13/18  Hongalgi, Lenis Dickinson, MD  Polyethylene Glycol 400 (BLINK TEARS) 0.25 % SOLN Apply 1 drop to eye daily as needed (for eye irritation/dryness).    [provider]  tizanidine (ZANAFLEX) 2 MG capsule Take 1 capsule (2 mg total) by mouth at bedtime. 08/06/18   Pieter Partridge, DO  Topiramate ER (TROKENDI XR) 200 MG CP24 Take 200 mg by mouth at bedtime. 10/02/18   Pieter Partridge, DO    Family History Family History  Problem Relation Age of Onset  . Cancer Mother 32       colon  . Cancer Father        renal cell  . Cancer Brother        bladder  . Cancer Sister        primary brain, and primary breast too  . Cancer Sister        breast   . Cancer Other        multiple aunts with breast, cervical, or colon cancer    Social History Social History   Tobacco Use  . Smoking status: Never Smoker  . Smokeless tobacco: Never Used  Substance Use Topics  . Alcohol use: No    Alcohol/week: 0.0 standard drinks  . Drug use: No     Allergies   Fentanyl   Review of Systems Review of Systems  Constitutional: Negative for appetite change and fatigue.  HENT: Negative for congestion, ear discharge and sinus pressure.   Eyes: Negative for discharge.  Respiratory: Negative for cough.   Cardiovascular: Positive for chest pain.  Gastrointestinal: Negative for abdominal pain and diarrhea.  Genitourinary: Negative for frequency  and hematuria.  Musculoskeletal: Negative for back pain.  Skin: Negative for rash.  Neurological: Negative for seizures and headaches.  Psychiatric/Behavioral: Negative for hallucinations.     Physical Exam Updated Vital Signs BP (!) 168/59   Pulse 73   Temp 98.4 F (36.9 C)   Resp 16   Ht 5\' 8"  (1.727 m)   Wt 112 kg   SpO2 99%   BMI 37.54 kg/m   Physical Exam Vitals signs and nursing note reviewed.  Constitutional:      Appearance: She is well-developed.  HENT:     Head: Normocephalic.     Nose: Nose normal.  Eyes:     General: No scleral icterus.    Conjunctiva/sclera: Conjunctivae normal.  Neck:     Musculoskeletal: Neck supple.     Thyroid: No thyromegaly.  Cardiovascular:     Rate and Rhythm: Normal rate and regular rhythm.     Heart sounds: No murmur. No friction rub. No gallop.   Pulmonary:     Breath sounds: No stridor. No wheezing or rales.  Chest:     Chest wall: No tenderness.  Abdominal:     General: There is no distension.     Tenderness: There is no abdominal tenderness. There is no rebound.  Musculoskeletal: Normal range of motion.  Lymphadenopathy:     Cervical: No cervical adenopathy.  Skin:    Findings: No erythema or rash.  Neurological:     Mental Status: She is oriented to person, place, and time.     Motor: No abnormal muscle tone.     Coordination: Coordination normal.  Psychiatric:        Behavior: Behavior normal.      ED Treatments / Results  Labs (all labs ordered are listed,  but only abnormal results are displayed) Labs Reviewed  CBC WITH DIFFERENTIAL/PLATELET  COMPREHENSIVE METABOLIC PANEL  TROPONIN I (HIGH SENSITIVITY)    EKG None  Radiology No results found.  Procedures Procedures (including critical care time)  Medications Ordered in ED Medications - No data to display   Initial Impression / Assessment and Plan / ED Course  I have reviewed the triage vital signs and the nursing notes.  Pertinent labs &  imaging results that were available during my care of the patient were reviewed by me and considered in my medical decision making (see chart for details).        Patient with elevated blood pressure and 3 minutes of chest discomfort.  She will get basic labs troponin and chest x-ray.  EKG shows no acute change  Final Clinical Impressions(s) / ED Diagnoses   Final diagnoses:  None    ED Discharge Orders    None       Milton Ferguson, MD 02/26/19 1930

## 2019-02-21 NOTE — ED Triage Notes (Signed)
Pt sent over by cardiac rehab for BP 220/100. Pt states she was taken off Lisinopril and water pill a few months ago due to increasing kidney problems. Pt states that she took BP this morning and it was 105/50 which is her baseline. Denies HA, neuro symptoms, or vision changes. Pt states sometimes her legs will swell if she does not wear compression socks.

## 2019-02-21 NOTE — Telephone Encounter (Signed)
Patient states she was sent to cardiology from cardiac rehab because her BP was 200/95 and they will not let her exercise.She was told to see [place].She states her BP is going "up and up". She initially denied any symptoms of CP, dizziness, headache. Per B.Strader , PA-C, she advised that she go home and track BP. Patient then stated she has chest pressure since she arrived today as she was waiting in her car for cardiac rehab.Per [place], patient should be evaluated in the ED. Patient agreed to go to ED for evaluation    I will [facility] DOD

## 2019-02-24 DIAGNOSIS — E119 Type 2 diabetes mellitus without complications: Secondary | ICD-10-CM | POA: Diagnosis not present

## 2019-02-24 DIAGNOSIS — J301 Allergic rhinitis due to pollen: Secondary | ICD-10-CM | POA: Diagnosis not present

## 2019-02-24 DIAGNOSIS — I10 Essential (primary) hypertension: Secondary | ICD-10-CM | POA: Diagnosis not present

## 2019-02-24 DIAGNOSIS — M159 Polyosteoarthritis, unspecified: Secondary | ICD-10-CM | POA: Diagnosis not present

## 2019-02-26 DIAGNOSIS — J301 Allergic rhinitis due to pollen: Secondary | ICD-10-CM | POA: Diagnosis not present

## 2019-02-28 DIAGNOSIS — J301 Allergic rhinitis due to pollen: Secondary | ICD-10-CM | POA: Diagnosis not present

## 2019-03-03 DIAGNOSIS — J301 Allergic rhinitis due to pollen: Secondary | ICD-10-CM | POA: Diagnosis not present

## 2019-03-07 DIAGNOSIS — J301 Allergic rhinitis due to pollen: Secondary | ICD-10-CM | POA: Diagnosis not present

## 2019-03-10 DIAGNOSIS — J301 Allergic rhinitis due to pollen: Secondary | ICD-10-CM | POA: Diagnosis not present

## 2019-03-12 DIAGNOSIS — J301 Allergic rhinitis due to pollen: Secondary | ICD-10-CM | POA: Diagnosis not present

## 2019-03-13 ENCOUNTER — Encounter: Payer: Self-pay | Admitting: Cardiovascular Disease

## 2019-03-13 ENCOUNTER — Ambulatory Visit (INDEPENDENT_AMBULATORY_CARE_PROVIDER_SITE_OTHER): Payer: Medicare Other | Admitting: Cardiovascular Disease

## 2019-03-13 ENCOUNTER — Other Ambulatory Visit: Payer: Self-pay

## 2019-03-13 VITALS — BP 143/68 | HR 84 | Temp 97.5°F | Ht 68.0 in | Wt 243.0 lb

## 2019-03-13 DIAGNOSIS — I2583 Coronary atherosclerosis due to lipid rich plaque: Secondary | ICD-10-CM

## 2019-03-13 DIAGNOSIS — Z9989 Dependence on other enabling machines and devices: Secondary | ICD-10-CM | POA: Diagnosis not present

## 2019-03-13 DIAGNOSIS — I10 Essential (primary) hypertension: Secondary | ICD-10-CM | POA: Diagnosis not present

## 2019-03-13 DIAGNOSIS — G4733 Obstructive sleep apnea (adult) (pediatric): Secondary | ICD-10-CM

## 2019-03-13 DIAGNOSIS — I2510 Atherosclerotic heart disease of native coronary artery without angina pectoris: Secondary | ICD-10-CM

## 2019-03-13 DIAGNOSIS — E785 Hyperlipidemia, unspecified: Secondary | ICD-10-CM | POA: Diagnosis not present

## 2019-03-13 DIAGNOSIS — I25118 Atherosclerotic heart disease of native coronary artery with other forms of angina pectoris: Secondary | ICD-10-CM

## 2019-03-13 MED ORDER — CARVEDILOL 3.125 MG PO TABS: ORAL_TABLET | ORAL | 3 refills | Status: DC

## 2019-03-13 NOTE — Patient Instructions (Addendum)
Medication Instructions: Take Coreg 3.125 mg one hour before cardiac rehab  No PLAVIX after 03/23/19  Take only Aspirin 81 mg daily  Labwork: none  Procedures/Testing: none  Follow-Up: 6 months with Dr.Koneswaran  Any Additional Special Instructions Will Be Listed Below (If Applicable).     If you need a refill on your cardiac medications before your next appointment, please call your pharmacy.

## 2019-03-13 NOTE — Progress Notes (Signed)
SUBJECTIVE: Patient presents for follow-up of hypertension and coronary artery disease.  Her systolic blood pressure was over 200 while participating in cardiac rehab and was sent to the ED for evaluation on 02/21/2019.  She is complaining of some chest tightness and shortness of breath at that time.  Blood pressure in the ED was 168/59. Chest x-ray showed no acute cardiopulmonary disease. High-sensitivity troponins were normal.  I personally reviewed the ECG which showed sinus rhythm with a left bundle branch block.  BUN 36, creatinine 1.  CBC was normal.  In summary, she underwent coronary angiography on 03/22/2018 which showed 80% Proximal-LAD stenosis with 20% Proximal-RCA stenosis. SuccessfulPTCA/DES x 1was performed to theproximal LADand she was started on DAPT withAspirin 81mg  daily and Clopidogrel 75mg  daily.  Echocardiogram on 07/17/2018 demonstrated normal left ventricular systolic function and regional wall motion, LVEF 60 to 65%, indeterminate grade diastolic dysfunction, moderate LVH, and mild left atrial dilatation.  She really enjoys cardiac rehabilitation and begins again this upcoming Monday.  She checks her blood pressure at home daily and every morning systolic blood pressures are in the low 100s.  This morning it was 100/58.  When it is hot humid outside and she walks from her car into the hospital for cardiac rehab her blood pressure goes up and she experiences chest heaviness.  She does not feel this way when temperatures are cooler.  She has bilateral leg swelling and uses compression stockings.  Carvedilol and lisinopril was stopped on Dec 03, 2018 when she was experiencing symptomatic hypotension.   Social history:She is originally from [place]. She used to be a Forensic psychologist. She knows [place] very well.  Review of Systems: As per "subjective", otherwise negative.  Allergies  Allergen Reactions  . Fentanyl Nausea And Vomiting and Other  (See Comments)    Nausea, vomiting, loss of consciousness, requiring reversal    Current Outpatient Medications  Medication Sig Dispense Refill  . aspirin EC 81 MG tablet Take 81 mg by mouth daily.    Marland Kitchen atorvastatin (LIPITOR) 80 MG tablet Take 1 tablet (80 mg total) by mouth daily at 6 PM. 90 tablet 3  . Cholecalciferol (VITAMIN D-3) 5000 units TABS Take 5,000 Units by mouth daily.     . clopidogrel (PLAVIX) 75 MG tablet Take 1 tablet (75 mg total) by mouth daily with breakfast. 90 tablet 3  . EPINEPHrine 0.3 mg/0.3 mL IJ SOAJ injection Inject 0.3 mg into the muscle as needed.  1  . furosemide (LASIX) 20 MG tablet Take 1 tablet (20 mg total) by mouth as needed for up to 30 days. 30 tablet 3  . gabapentin (NEURONTIN) 400 MG capsule Take 400 mg by mouth at bedtime.    . gabapentin (NEURONTIN) 800 MG tablet Take 800 mg by mouth at bedtime.    . Galcanezumab-gnlm (EMGALITY) 120 MG/ML SOAJ Inject 120 mg into the skin every 30 (thirty) days. 1 pen 11  . Insulin Human (INSULIN PUMP) SOLN Inject into the skin continuous.     Marland Kitchen levothyroxine (SYNTHROID, LEVOTHROID) 125 MCG tablet Take 125 mcg by mouth daily before breakfast.   0  . metFORMIN (GLUCOPHAGE) 500 MG tablet Take 1 tablet (500 mg total) by mouth 2 (two) times daily with a meal. Do not take 03/23/2018 and 03/24/2018. Restart taking from 03/25/2018. (Patient taking differently: Take 500 mg by mouth 2 (two) times daily with a meal. )    . Multiple Vitamins-Minerals (CENTRUM SILVER 50+WOMEN) TABS Take 1  tablet by mouth daily.     . nitroGLYCERIN (NITROSTAT) 0.4 MG SL tablet Place 1 tablet (0.4 mg total) under the tongue every 5 (five) minutes as needed for chest pain. 25 tablet 3  . NOVOLOG 100 UNIT/ML injection Inject into the skin continuous.   0  . Omega-3 Fatty Acids (FISH OIL) 1000 MG CAPS Take 1,000 mg by mouth daily.    . Polyethylene Glycol 400 (BLINK TEARS) 0.25 % SOLN Apply 1 drop to eye daily as needed (for eye irritation/dryness).    .  Topiramate ER (TROKENDI XR) 200 MG CP24 Take 200 mg by mouth at bedtime. 30 capsule 3  . Fluocinolone Acetonide (DERMOTIC) 0.01 % OIL Place 1 drop into both ears at bedtime.     Marland Kitchen levocetirizine (XYZAL) 5 MG tablet Take 5 mg by mouth daily with breakfast.    . pantoprazole (PROTONIX) 40 MG tablet Take 1 tablet (40 mg total) by mouth 2 (two) times daily before a meal. 60 tablet 0  . tizanidine (ZANAFLEX) 2 MG capsule Take 1 capsule (2 mg total) by mouth at bedtime. 30 capsule 1   No current facility-administered medications for this visit.     Past Medical History:  Diagnosis Date  . Adult RDS ([place])   . Anemia   . Brain aneurysm    frontal lobe  . CAD (coronary artery disease)    a. 02/2018: s/p DES to Proximal LAD with residual 20% RCA stenosis.  . Chronic pain   . Diabetes mellitus without complication ([place])   . Headache   . History of left bundle branch block (LBBB)   . HTN (hypertension)   . Hx of cardiovascular stress test 10/2016   intermediate risk study  . Hypothyroidism   . Neuromuscular disorder ([place])   . Neuropathy   . Obesity   . OSA on CPAP 03/10/2015  . Retinopathy   . Sleep apnea   . Varicose veins of both lower extremities     Past Surgical History:  Procedure Laterality Date  . BIOPSY  11/14/2016   Procedure: BIOPSY;  Surgeon: Danie Binder, MD;  [facility];  Service: Endoscopy;;  colon gastric duodenal  . BREAST SURGERY Left 1994   Lumpectomy  . COLONOSCOPY WITH PROPOFOL N/A 11/14/2016   Dr. Oneida Alar: Moderately redundant rectosigmoid colon. Random colon biopsies benign. Internal hemorrhoids. Surveillance colonoscopy in 5 years.  . CORONARY STENT INTERVENTION N/A 03/22/2018   Procedure: CORONARY STENT INTERVENTION;  Surgeon: Burnell Blanks, MD;  Location: [place] [facility];  Service: Cardiovascular;  Laterality: N/A;  . ESOPHAGOGASTRODUODENOSCOPY (EGD) WITH PROPOFOL N/A 11/14/2016   Dr. Oneida Alar: Esophagus normal. Moderate gastritis, few  gastric polyps. Duodenal biopsies negative. Fundic gland polyp gastric polyp area no H pylori.  Marland Kitchen FOOT SURGERY    . LEFT HEART CATH AND CORONARY ANGIOGRAPHY N/A 03/22/2018   Procedure: LEFT HEART CATH AND CORONARY ANGIOGRAPHY;  Surgeon: Burnell Blanks, MD;  Location: [place] [facility];  Service: Cardiovascular;  Laterality: N/A;  . LEG SURGERY Right   . REPLACEMENT TOTAL KNEE Right   . SPINAL CORD STIMULATOR IMPLANT      Social History   Socioeconomic History  . Marital status: Widowed    Spouse name: Not on file  . Number of children: Not on file  . Years of education: Not on file  . Highest education level: Not on file  Occupational History  . Occupation: Retired  Scientific laboratory technician  . Financial resource strain: Not on  file  . Food insecurity    Worry: Not on file    Inability: Not on file  . Transportation needs    Medical: Not on file    Non-medical: Not on file  Tobacco Use  . Smoking status: Never Smoker  . Smokeless tobacco: Never Used  Substance and Sexual Activity  . Alcohol use: No    Alcohol/week: 0.0 standard drinks  . Drug use: No  . Sexual activity: Yes    Partners: Male  Lifestyle  . Physical activity    Days per week: Not on file    Minutes per session: Not on file  . Stress: Not on file  Relationships  . Social Herbalist on phone: Not on file    Gets together: Not on file    Attends religious service: Not on file    Active member of club or organization: Not on file    Attends meetings of clubs or organizations: Not on file    Relationship status: Not on file  . Intimate partner violence    Fear of current or ex partner: Not on file    Emotionally abused: Not on file    Physically abused: Not on file    Forced sexual activity: Not on file  Other Topics Concern  . Not on file  Social History Narrative   Drinks about 1-2 cups of caffeine daily.     Vitals:   03/13/19 1300  BP: (!) 143/68  Pulse: 84  Temp: (!) 97.5 F (36.4  C)  Weight: 243 lb (110.2 kg)  Height: 5\' 8"  (1.727 m)    Wt Readings from Last 3 Encounters:  03/13/19 243 lb (110.2 kg)  02/21/19 246 lb 14.6 oz (112 kg)  10/02/18 248 lb (112.5 kg)     PHYSICAL EXAM General: NAD HEENT: Normal. Neck: No JVD, no thyromegaly. Lungs: Clear to auscultation bilaterally with normal respiratory effort. CV: Regular rate and rhythm, normal S1/S2, no W2/O3, soft systolicmurmurover left upper sternal border.Trace bilateral lower extremityedema. Abdomen: Soft, nontender, no distention.  Neurologic: Alert and oriented.  Psych: Normal affect. Skin: Normal. Musculoskeletal: No gross deformities.    ECG: Reviewed above under Subjective   Labs: Lab Results  Component Value Date/Time   K 4.6 02/21/2019 03:34 PM   BUN 36 (H) 02/21/2019 03:34 PM   CREATININE 1.00 02/21/2019 03:34 PM   CREATININE 0.98 07/23/2015 07:07 AM   ALT 32 02/21/2019 03:34 PM   TSH 0.647 12/03/2018 11:03 AM   TSH 1.990 07/23/2015 07:07 AM   HGB 12.8 02/21/2019 03:34 PM     Lipids: Lab Results  Component Value Date/Time   LDLCALC 54 04/16/2018 08:15 AM   CHOL 134 04/16/2018 08:15 AM   TRIG 23 04/16/2018 08:15 AM   HDL 75 04/16/2018 08:15 AM       ASSESSMENT AND PLAN:  1. Coronary artery disease: Status post drug-eluting stent placement to the proximal LAD in August 2019. Left ventricular systolic function is normal, EF 60 to 65% by echocardiogram in December 2019.  Symptomatically stable. Continue dual antiplatelet therapy with aspirin Plavix until the end of this month when she will stop Plavix and take aspirin only along with atorvastatin.  Given her exercise-induced hypertension, I have instructed her to take carvedilol 3.125 mg 1 hour prior to cardiac rehabilitation.  2. Hypertension: BP is mildly elevated.  However, systolic readings are normally in the low 100 range at home on a daily basis and her blood pressure cuff  has been checked for accuracy.  Given  her exercise-induced hypertension, I have instructed her to take carvedilol 3.125 mg 1 hour prior to cardiac rehabilitation.  3. Hyperlipidemia: LDL normal at 54 on 04/16/2018 with full lipid panel reviewed above. Continue atorvastatin.  4. Obstructive sleep apnea: Continue CPAP.    Disposition: Follow up 6 months   Kate Sable, M.D., F.A.C.C.

## 2019-03-14 DIAGNOSIS — J301 Allergic rhinitis due to pollen: Secondary | ICD-10-CM | POA: Diagnosis not present

## 2019-03-17 DIAGNOSIS — J301 Allergic rhinitis due to pollen: Secondary | ICD-10-CM | POA: Diagnosis not present

## 2019-03-19 DIAGNOSIS — J301 Allergic rhinitis due to pollen: Secondary | ICD-10-CM | POA: Diagnosis not present

## 2019-03-21 DIAGNOSIS — J301 Allergic rhinitis due to pollen: Secondary | ICD-10-CM | POA: Diagnosis not present

## 2019-03-24 DIAGNOSIS — E1140 Type 2 diabetes mellitus with diabetic neuropathy, unspecified: Secondary | ICD-10-CM | POA: Diagnosis not present

## 2019-03-24 DIAGNOSIS — Z9641 Presence of insulin pump (external) (internal): Secondary | ICD-10-CM | POA: Diagnosis not present

## 2019-03-24 DIAGNOSIS — E669 Obesity, unspecified: Secondary | ICD-10-CM | POA: Diagnosis not present

## 2019-03-24 DIAGNOSIS — E118 Type 2 diabetes mellitus with unspecified complications: Secondary | ICD-10-CM | POA: Diagnosis not present

## 2019-03-24 DIAGNOSIS — I10 Essential (primary) hypertension: Secondary | ICD-10-CM | POA: Diagnosis not present

## 2019-03-24 DIAGNOSIS — I2510 Atherosclerotic heart disease of native coronary artery without angina pectoris: Secondary | ICD-10-CM | POA: Diagnosis not present

## 2019-03-24 DIAGNOSIS — J301 Allergic rhinitis due to pollen: Secondary | ICD-10-CM | POA: Diagnosis not present

## 2019-03-24 DIAGNOSIS — M1460 Charcot's joint, unspecified site: Secondary | ICD-10-CM | POA: Diagnosis not present

## 2019-03-24 DIAGNOSIS — E785 Hyperlipidemia, unspecified: Secondary | ICD-10-CM | POA: Diagnosis not present

## 2019-03-24 DIAGNOSIS — E11319 Type 2 diabetes mellitus with unspecified diabetic retinopathy without macular edema: Secondary | ICD-10-CM | POA: Diagnosis not present

## 2019-03-24 DIAGNOSIS — E039 Hypothyroidism, unspecified: Secondary | ICD-10-CM | POA: Diagnosis not present

## 2019-03-24 DIAGNOSIS — E109 Type 1 diabetes mellitus without complications: Secondary | ICD-10-CM | POA: Diagnosis not present

## 2019-03-26 DIAGNOSIS — J301 Allergic rhinitis due to pollen: Secondary | ICD-10-CM | POA: Diagnosis not present

## 2019-03-27 ENCOUNTER — Ambulatory Visit: Payer: Medicare Other | Admitting: Neurology

## 2019-03-28 DIAGNOSIS — J301 Allergic rhinitis due to pollen: Secondary | ICD-10-CM | POA: Diagnosis not present

## 2019-03-31 DIAGNOSIS — J301 Allergic rhinitis due to pollen: Secondary | ICD-10-CM | POA: Diagnosis not present

## 2019-04-02 DIAGNOSIS — J301 Allergic rhinitis due to pollen: Secondary | ICD-10-CM | POA: Diagnosis not present

## 2019-04-02 NOTE — Progress Notes (Signed)
NEUROLOGY FOLLOW UP OFFICE NOTE  Jodi Ray [id number]  HISTORY OF PRESENT ILLNESS: Jodi Ray is a 58 year old handed woman with chronic pain related to complex regional pain syndrome (with spinal stimulator), hypertension, type 1 diabetes mellitus with polyneuropathy, OSA on CPAP, hypothyroidism, polymyalgia rheumatica who follows up for cluster headache.  UPDATE: Cluster HA: In March, switched back to Trokendi because it worked better but it was too expensive  Recommended to start [employer].  They have improved by early June. Intensity:  moderate Duration:  15 minutes after NS Frequency:  Twice a month. 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 Location:Right frontal region Quality:stabbing Intensity:10/10 Aura:no Prodrome:no Associated symptoms:Some nausea if severe. No autonomic symptoms. Duration:10 to 60 minutes Frequency:4 times a week Triggers/exacerbating factors:change in weather. Relieving factors:none Activity:Cannot function when experiencing it.  Past preventative therapy:Indomethacin 25mg  three times daily (effective but discontinued due to elevated liver enzymes), Verapamil (ineffective)  II Cluster headache:  She began having new intractable headaches in late Decemeber 2019.Her right eye gets bloodshot and right nare runs. Right side of face gets red. They stabbing headache starts above the right eye and radiates, constant for a month, fluctuating in intensity. She went to the [facility] ED on 08/11/18. CT of head and CTA of head and neck were performed and personally reviewed, showing no acute  abnormality or aneurysm. Sed rate from 08/22/18 was 24.  Past management:  Prednisone taper, headache cocktail              IIIMigraines:They are bi-frontal/maxillary and associated with slight nausea.She has had this before when her allergies "act up."  IVDiabetic neuropathy: Secondary to Type 1 diabetes.  VComplex regional pain syndrome: following an accident where she fell down the steps and crushed her right leg.She takes gabapentin, tramadol and has a spinal nerve stimulator.She takes this for her painful diabetic neuropathy as well.  VIRight arm pain and numbness: Since 2015, she has had episodes of right arm pain and numbness. It only occurs when she is laying in bed, and occurs no matter what position. Her entire arm goes "dead". It is both numb, painful and unable to move it. There is no shooting pain down the arm from the neck, however she gets occasional shooting pain from the right side of her neck into the shoulder. She has to use her other arm to shake it out and it resolves in a minute or two. It occurs every night. She was sent to pain management for possible cervical radiculopathy or thoracic outlet syndrome. She did receive injections, which helped. She had an MRI of the cervical and thoracic spine on 06/12/14. Imaging not available, but report mentions diffuse facet arthropathy and degenerative changes in the cervical spine, but no nerve root impingement or cord compression. There was no evidence of thoracic outlet syndrome. She was sent to [facility] for further evaluation. 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 They told her that her symptoms were related to her diabetic neuropathy.  VIICerebral aneurysm: In 2009, she had a CTA of the head which reportedly showed a 1-11mm an eurysm at the junction of  right A1 and A2 segment of the ACA. However, a repeat CTA performed on 02/22/12 did not reveal any aneurysm.  She cannot have an MRI due to spinal stimulator.  PAST MEDICAL HISTORY: Past Medical History:  Diagnosis Date  . 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 Anemia   . Brain aneurysm    frontal lobe  . CAD (coronary artery disease)    a. 02/2018: s/p DES to Proximal LAD with residual 20% RCA stenosis.  . Chronic pain   . Diabetes mellitus without complication ([place])   . Headache   . History of left bundle branch block (LBBB)   . HTN (hypertension)   . Hx of cardiovascular stress test 10/2016   intermediate risk study  . Hypothyroidism   . Neuromuscular disorder ([place])   . Neuropathy   . Obesity   . OSA on CPAP 03/10/2015  . Retinopathy   . Sleep apnea   . Varicose veins of both lower extremities     MEDICATIONS: Current Outpatient Medications on File Prior to Visit  Medication Sig Dispense Refill  . aspirin EC 81 MG tablet Take 81 mg by mouth daily.    Marland Kitchen atorvastatin (LIPITOR) 80 MG tablet Take 1 tablet (80 mg total) by mouth daily at 6 PM. 90 tablet 3  . carvedilol (COREG) 3.125 MG tablet Take 1 tablet (3.125 mg) 1 hour before cardiac rehab 90 tablet 3  . Cholecalciferol (VITAMIN D-3) 5000 units TABS Take 5,000 Units by mouth daily.     Marland Kitchen EPINEPHrine 0.3 mg/0.3 mL IJ SOAJ injection Inject 0.3 mg into the muscle as needed.  1  . Fluocinolone Acetonide (DERMOTIC) 0.01 % OIL Place 1 drop into both ears at bedtime.     . furosemide (LASIX) 20 MG tablet Take 1 tablet (20 mg total) by mouth as needed for up to 30 days. 30 tablet 3  . gabapentin (NEURONTIN) 400 MG capsule Take 400 mg by mouth at bedtime.    . gabapentin (NEURONTIN) 800 MG tablet Take 800 mg by mouth at bedtime.    . Galcanezumab-gnlm (EMGALITY) 120 MG/ML SOAJ Inject 120 mg into the skin every 30 (thirty) days. 1 pen 11  . Insulin Human (INSULIN PUMP) SOLN Inject into the skin continuous.     Marland Kitchen levocetirizine (XYZAL) 5 MG tablet  Take 5 mg by mouth daily with breakfast.    . levothyroxine (SYNTHROID, LEVOTHROID) 125 MCG tablet Take 125 mcg by mouth daily before breakfast.   0  . metFORMIN (GLUCOPHAGE) 500 MG tablet Take 1 tablet (500 mg total) by mouth 2 (two) times daily with a meal. Do not take 03/23/2018 and 03/24/2018. Restart taking from 03/25/2018. (Patient taking differently: Take 500 mg by mouth 2 (two) times daily with a meal. )    . Multiple Vitamins-Minerals (CENTRUM SILVER 50+WOMEN) TABS Take 1 tablet by mouth daily.     . nitroGLYCERIN (NITROSTAT) 0.4 MG SL tablet Place 1 tablet (0.4 mg total) under the tongue every 5 (five) minutes as needed for chest pain. 25 tablet 3  . NOVOLOG 100 UNIT/ML injection Inject into the skin continuous.   0  . Omega-3 Fatty Acids (FISH OIL) 1000 MG CAPS Take 1,000 mg by mouth daily.    . pantoprazole (PROTONIX) 40 MG tablet Take 1 tablet (40 mg total) by mouth 2 (two) times daily before a meal. 60 tablet 0  . Polyethylene Glycol 400 (BLINK TEARS) 0.25 % SOLN Apply 1 drop to eye daily as needed (for eye irritation/dryness).    Marland Kitchen  tizanidine (ZANAFLEX) 2 MG capsule Take 1 capsule (2 mg total) by mouth at bedtime. 30 capsule 1  . Topiramate ER (TROKENDI XR) 200 MG CP24 Take 200 mg by mouth at bedtime. 30 capsule 3   No current facility-administered medications on file prior to visit.     ALLERGIES: Allergies  Allergen Reactions  . Fentanyl Nausea And Vomiting and Other (See Comments)    Nausea, vomiting, loss of consciousness, requiring reversal    FAMILY HISTORY: Family History  Problem Relation Age of Onset  . Cancer Mother 30       colon  . Cancer Father        renal cell  . Cancer Brother        bladder  . Cancer Sister        primary brain, and primary breast too  . Cancer Sister        breast   . Cancer Other        multiple aunts with breast, cervical, or colon cancer    SOCIAL HISTORY: Social History   Socioeconomic History  . Marital status: Widowed     Spouse name: Not on file  . Number of children: Not on file  . Years of education: Not on file  . Highest education level: Not on file  Occupational History  . Occupation: Retired  Scientific laboratory technician  . Financial resource strain: Not on file  . Food insecurity    Worry: Not on file    Inability: Not on file  . Transportation needs    Medical: Not on file    Non-medical: Not on file  Tobacco Use  . Smoking status: Never Smoker  . Smokeless tobacco: Never Used  Substance and Sexual Activity  . Alcohol use: No    Alcohol/week: 0.0 standard drinks  . Drug use: No  . Sexual activity: Yes    Partners: Male  Lifestyle  . Physical activity    Days per week: Not on file    Minutes per session: Not on file  . Stress: Not on file  Relationships  . Social Herbalist on phone: Not on file    Gets together: Not on file    Attends religious service: Not on file    Active member of club or organization: Not on file    Attends meetings of clubs or organizations: Not on file    Relationship status: Not on file  . Intimate partner violence    Fear of current or ex partner: Not on file    Emotionally abused: Not on file    Physically abused: Not on file    Forced sexual activity: Not on file  Other Topics Concern  . Not on file  Social History Narrative   Drinks about 1-2 cups of caffeine daily.    REVIEW OF SYSTEMS: Constitutional: No fevers, chills, or sweats, no generalized fatigue, change in appetite Eyes: No visual changes, double vision, eye pain Ear, nose and throat: No hearing loss, ear pain, nasal congestion, sore throat Cardiovascular: No chest pain, palpitations Respiratory:  No shortness of breath at rest or with exertion, wheezes GastrointestinaI: No nausea, vomiting, diarrhea, abdominal pain, fecal incontinence Genitourinary:  No dysuria, urinary retention or frequency Musculoskeletal:  No neck pain, back pain Integumentary: No rash, pruritus, skin lesions  Neurological: as above Psychiatric: No depression, insomnia, anxiety Endocrine: No palpitations, fatigue, diaphoresis, mood swings, change in appetite, change in weight, increased thirst Hematologic/Lymphatic:  No purpura, petechiae. Allergic/Immunologic:  no itchy/runny eyes, nasal congestion, recent allergic reactions, rashes  PHYSICAL EXAM: Blood pressure (!) 164/76, pulse 76, temperature 97.6 F (36.4 C), height 5\' 8"  (1.727 m), weight 239 lb 6.4 oz (108.6 kg), SpO2 95 %. General: No acute distress.  Patient appears well-groomed.   Head:  Normocephalic/atraumatic Eyes:  Fundi examined but not visualized Neck: supple, no paraspinal tenderness, full range of motion Heart:  Regular rate and rhythm Lungs:  Clear to auscultation bilaterally Back: No paraspinal tenderness Neurological Exam: alert and oriented to person, place, and time. Attention span and concentration intact, recent and remote memory intact, fund of knowledge intact.  Speech fluent and not dysarthric, language intact.  CN II-XII intact. Bulk and tone normal, muscle strength 5/5 throughout.  Sensation to light touch  intact.  Deep tendon reflexes 2+ throughout.  Finger to nose testing intact.  Gait normal, Romberg negative.  IMPRESSION: 1.  History of primary stabbing headache, recently presenting as intractable cluster headache.  Now with episodic cluster headache, not intractable, stable  PLAN: 1.  For preventative management, continue Emgality monthly, topiramate 200mg  at bedtime and melatonin 2.  For abortive therapy, lidocaine NS 3.  Limit use of pain relievers to no more than 2 days out of week to prevent risk of rebound or medication-overuse headache. 4.  Keep headache diary 5.  Exercise, hydration, caffeine cessation, sleep hygiene, monitor for and avoid triggers 6.  Consider:  magnesium citrate 400mg  daily, riboflavin 400mg  daily, and coenzyme Q10 100mg  three times daily 7. Always keep in mind that currently taking  a hormone or birth control may be a possible trigger or aggravating factor for migraine. 8. Follow up 5 months.  Metta Clines, DO  CC: Monico Blitz, MD

## 2019-04-03 ENCOUNTER — Encounter: Payer: Self-pay | Admitting: Neurology

## 2019-04-03 ENCOUNTER — Ambulatory Visit (INDEPENDENT_AMBULATORY_CARE_PROVIDER_SITE_OTHER): Payer: Medicare Other | Admitting: Neurology

## 2019-04-03 ENCOUNTER — Other Ambulatory Visit: Payer: Self-pay

## 2019-04-03 VITALS — BP 164/76 | HR 76 | Temp 97.6°F | Ht 68.0 in | Wt 239.4 lb

## 2019-04-03 DIAGNOSIS — G44019 Episodic cluster headache, not intractable: Secondary | ICD-10-CM

## 2019-04-03 DIAGNOSIS — I2510 Atherosclerotic heart disease of native coronary artery without angina pectoris: Secondary | ICD-10-CM

## 2019-04-03 DIAGNOSIS — I2583 Coronary atherosclerosis due to lipid rich plaque: Secondary | ICD-10-CM

## 2019-04-03 NOTE — Patient Instructions (Signed)
1.  Continue topiramate, Emgality and melatonin. 2.  Use the lidocaine nasal spray as needed. 3.  Follow up in 5 months.

## 2019-04-04 DIAGNOSIS — J301 Allergic rhinitis due to pollen: Secondary | ICD-10-CM | POA: Diagnosis not present

## 2019-04-09 DIAGNOSIS — J301 Allergic rhinitis due to pollen: Secondary | ICD-10-CM | POA: Diagnosis not present

## 2019-04-11 DIAGNOSIS — J301 Allergic rhinitis due to pollen: Secondary | ICD-10-CM | POA: Diagnosis not present

## 2019-04-14 DIAGNOSIS — E113593 Type 2 diabetes mellitus with proliferative diabetic retinopathy without macular edema, bilateral: Secondary | ICD-10-CM | POA: Diagnosis not present

## 2019-04-14 DIAGNOSIS — J301 Allergic rhinitis due to pollen: Secondary | ICD-10-CM | POA: Diagnosis not present

## 2019-04-16 DIAGNOSIS — J301 Allergic rhinitis due to pollen: Secondary | ICD-10-CM | POA: Diagnosis not present

## 2019-04-18 ENCOUNTER — Other Ambulatory Visit: Payer: Self-pay | Admitting: Student

## 2019-04-18 DIAGNOSIS — J301 Allergic rhinitis due to pollen: Secondary | ICD-10-CM | POA: Diagnosis not present

## 2019-04-21 ENCOUNTER — Other Ambulatory Visit (HOSPITAL_COMMUNITY)
Admission: RE | Admit: 2019-04-21 | Discharge: 2019-04-21 | Disposition: A | Discharge status: Home / Self Care | Payer: Medicare Other | Source: Ambulatory Visit | Attending: Nephrology | Admitting: Nephrology

## 2019-04-21 DIAGNOSIS — N2581 Secondary hyperparathyroidism of renal origin: Secondary | ICD-10-CM | POA: Insufficient documentation

## 2019-04-21 DIAGNOSIS — D509 Iron deficiency anemia, unspecified: Secondary | ICD-10-CM | POA: Diagnosis not present

## 2019-04-21 DIAGNOSIS — N183 Chronic kidney disease, stage 3 (moderate): Secondary | ICD-10-CM | POA: Insufficient documentation

## 2019-04-21 DIAGNOSIS — E119 Type 2 diabetes mellitus without complications: Secondary | ICD-10-CM | POA: Diagnosis not present

## 2019-04-21 DIAGNOSIS — J301 Allergic rhinitis due to pollen: Secondary | ICD-10-CM | POA: Diagnosis not present

## 2019-04-21 DIAGNOSIS — I129 Hypertensive chronic kidney disease with stage 1 through stage 4 chronic kidney disease, or unspecified chronic kidney disease: Secondary | ICD-10-CM | POA: Insufficient documentation

## 2019-04-21 LAB — RENAL FUNCTION PANEL
Albumin: 4.2 g/dL (ref 3.5–5.0)
Anion gap: 8 (ref 5–15)
BUN: 29 mg/dL — ABNORMAL HIGH (ref 6–20)
CO2: 24 mmol/L (ref 22–32)
Calcium: 9.5 mg/dL (ref 8.9–10.3)
Chloride: 107 mmol/L (ref 98–111)
Creatinine, Ser: 0.99 mg/dL (ref 0.44–1.00)
GFR calc Af Amer: >60 mL/min (ref >60)
GFR calc non Af Amer: >60 mL/min (ref >60)
Glucose, Bld: 186 mg/dL — ABNORMAL HIGH (ref 70–99)
Phosphorus: 3.5 mg/dL (ref 2.5–4.6)
Potassium: 4.5 mmol/L (ref 3.5–5.1)
Sodium: 139 mmol/L (ref 135–145)

## 2019-04-21 LAB — IRON AND TIBC
Iron: 88 ug/dL (ref 28–170)
Saturation Ratios: 27 % (ref 10.4–31.8)
TIBC: 324 ug/dL (ref 250–450)
UIBC: 236 ug/dL

## 2019-04-21 LAB — PROTEIN / CREATININE RATIO, URINE
Creatinine, Urine: 32.43 mg/dL
Total Protein, Urine: <6 mg/dL

## 2019-04-21 LAB — HEMOGLOBIN AND HEMATOCRIT, BLOOD
HCT: 40.4 % (ref 36.0–46.0)
Hemoglobin: 12.6 g/dL (ref 12.0–15.0)

## 2019-04-21 LAB — FERRITIN: Ferritin: 55 ng/mL (ref 11–307)

## 2019-04-22 LAB — PTH, INTACT AND CALCIUM
Calcium, Total (PTH): 9.7 mg/dL (ref 8.7–10.3)
PTH: 31 pg/mL (ref 15–65)

## 2019-04-22 LAB — VITAMIN D 25 HYDROXY (VIT D DEFICIENCY, FRACTURES): Vit D, 25-Hydroxy: 52.8 ng/mL (ref 30.0–100.0)

## 2019-04-23 ENCOUNTER — Encounter: Payer: Self-pay | Admitting: Neurology

## 2019-04-23 ENCOUNTER — Encounter: Payer: Self-pay | Admitting: Adult Health

## 2019-04-23 DIAGNOSIS — J301 Allergic rhinitis due to pollen: Secondary | ICD-10-CM | POA: Diagnosis not present

## 2019-04-24 ENCOUNTER — Ambulatory Visit (INDEPENDENT_AMBULATORY_CARE_PROVIDER_SITE_OTHER): Payer: Medicare Other | Admitting: Neurology

## 2019-04-24 ENCOUNTER — Encounter: Payer: Self-pay | Admitting: Neurology

## 2019-04-24 ENCOUNTER — Other Ambulatory Visit: Payer: Self-pay

## 2019-04-24 VITALS — BP 136/67 | HR 88 | Temp 97.1°F | Ht 68.0 in | Wt 236.0 lb

## 2019-04-24 DIAGNOSIS — I2583 Coronary atherosclerosis due to lipid rich plaque: Secondary | ICD-10-CM

## 2019-04-24 DIAGNOSIS — I2510 Atherosclerotic heart disease of native coronary artery without angina pectoris: Secondary | ICD-10-CM | POA: Diagnosis not present

## 2019-04-24 DIAGNOSIS — Z9989 Dependence on other enabling machines and devices: Secondary | ICD-10-CM

## 2019-04-24 DIAGNOSIS — G44019 Episodic cluster headache, not intractable: Secondary | ICD-10-CM | POA: Diagnosis not present

## 2019-04-24 DIAGNOSIS — M159 Polyosteoarthritis, unspecified: Secondary | ICD-10-CM | POA: Diagnosis not present

## 2019-04-24 DIAGNOSIS — I10 Essential (primary) hypertension: Secondary | ICD-10-CM | POA: Diagnosis not present

## 2019-04-24 DIAGNOSIS — G4733 Obstructive sleep apnea (adult) (pediatric): Secondary | ICD-10-CM | POA: Diagnosis not present

## 2019-04-24 DIAGNOSIS — E119 Type 2 diabetes mellitus without complications: Secondary | ICD-10-CM | POA: Diagnosis not present

## 2019-04-24 NOTE — Patient Instructions (Signed)

## 2019-04-24 NOTE — Progress Notes (Signed)
SLEEP MEDICINE CLINIC   Provider:  Larey Seat, M D  Referring Provider: Monico Blitz, MD Primary Care Physician:  Monico Blitz, MD  Chief Complaint  Patient presents with  . Follow-up    pt alone, rm 11 follow up. DME [facility]/. pt states things are going well.     Revisit on 04-24-2019.  this patient returns a sleep study and is now on CPAP with good compliance.  She gets new supplies, a new sleep study was not ordered. The current settings are 11.6 cm water with 2 cm EPR, 100% compliance.  This is been better has used her machine every day of the last 30 days between 25 August on 23 April 2019 average usage time 7 hours and 43 minutes.  Her residual AHI is 0.6/h she does have moderate air leaks, she does have obstructive sleep apnea and rare central events.  Her Epworth Sleepiness Scale has been endorsed at only 2 points at her fatigue severity at 9 points her geriatric depression score was endorsed at 9 out of 15 points.   HPI: Jodi Ray is a 58 y.o. female , seen here as a referral from [place] for transfer of sleep care,[name]  is a 58 year old Caucasian female who is also followed by Dr. Metta Clines, DO.  She was diagnosed with sleep apnea long before I met her, and transferred her care here to be able to get supplies.  She has remained a very compliant CPAP user and is followed by [facility] for durable medical equipment.  She is 100% compliant over the last 30 days concluding with the night of the 15th-16th of July 2019.  Average use at time of 7 hours 48 minutes, CPAP is set at 11.6 cm water is 2 cm expiratory pressure relief.  The 95th percentile leak is 3.7 L which is excellent her residual AHI is only 1.4/h and there are no central apneas emerging.    Jodi Ray is intending to take her CPAP with her when she travels and she has not traveled in the last decade or so by airplane.  She learned that she will need a medical device card.  I have not This request  before I think the medical device card is usually used for insulin pumps and spinal cord stimulators both implanted devices which she uses.  However I will ask [facility] if they can provide a medical device card so that she can check her CPAP as a on board carry him.  She also is very happy with the CPAP therapy and its effect on her daytime sleepiness, she endorsed the Epworth Sleepiness Scale at only 3 points, she does not have excessive daytime fatigue, her average sleep time is well over 7 hours.  She does not wake up at night, sleeps soundly through and has no nocturia.    Chief complaint according to patient : " I need new supplies, i don't need a sleep study". Interval history from 01/22/2017. This is my first revisit with the patient since transfer of sleep care. The patient is doing very well with CPAP use, being 100% compliant with an average user time of 8 hours and 12 minutes, set pressure of 10.6 cm water with 27 m EPR and a residual AHI of 2.1. Of these obstructive apneas are 1.4 per hour. The patient's boyfriend has noted her to still snore and for this reason I will asked today to increase her pressure to 11.6 cm water. I will contact [facility]'s  Linzie Collin to do the arrangements, making him aware that we treat snoring. Epworth Sleepiness Scale was endorsed at 4 points and the patient did not endorse fatigue  CONSULT NOTE: Jodi Ray, a right handed caucasian female patient of Dr. Trena Platt, is today referred for follow-up of her sleep apnea condition. She was diagnosed more than 13 years ago with obstructive sleep apnea and had been followed by Dr. Adriana Reams but he has left the [facility] , she has received a new CPAP machine just in December 2015 so she is not in need of any new equipment but she needs continued supplies. As she has been a Medicare patient since 2006 she will need a sleep doctor visit once a year to allow her to obtain CPAP supplies. The patient moved here from [place] in 2015 and her original sleep study was performed in [place], she was told they are no records available as they were older than 10 years. Her risk factors are clear as she has hypertension associated with diabetes, morbid obesity, hypothyroidism and diabetes. She was also diagnosed with a polyneuropathy, she has been diagnosed with an intracerebral aneurysm and was a stabbing headache or ice pick headache. She is a nonsmoker and nondrinker and she drinks only one cup of caffeine at coffee per day. Her blood pressure has been well controlled on her current medical regimen,  is almost too low.  Jodi Ray is followed by a pain management practice. She has a new general  neurologist, Metta Clines, DO , who follows her headaches.   I'm able to review Jodi Ray's last CPAP download, obtained on 03-09-15, just yesterday in preparation for this visit. It shows that she is 100% compliant for 30 out of 30 days and all these days she has used her CPAP more than 4 hours, 7 hours and 52 minutes on average. Her pressure is 10.6 cm water with a full-time EPR of 2 cm water. A residual AHI is 1.4. She does have high air leaks. It seems that she is still using an AutoSet a so-called AUTO set S 10 machine by [employer].  She is feeling comfortable with the current setting is sleeping well and does not wake up with headaches, dry mouth or from snoring. She will have one bathroom break at night. She usually sleeps from 10 PM plus minus one hour, to 6 AM when her dog wakes her up. She feels refreshed and restored in the mornings and feels that overall the morning time as her best time of the day to be productive and energized. She does not nap in daytime.  Interval history from 03/09/2016,This is a compliance visit. She does not drink does not use tobacco products or recreational drugs, she endorsed apnea ice pick headaches and speech difficulties. Her compliance download shows that she has used the machine 100% of  all days over 4 hours with an average user time of 8 hours and 19 minutes,  pressure is 10.6 cm water, with  full-time EPR of 2 cm water residual AHI is excellent at only 1.3. There has been no major air leak. The needs to be no adjustments made to the machine. She also responds with an Epworth sleepiness score of only 3 points fatigue severity scale was not endorsed.  She is taking a sleep aid for almost 10 years, continued under Dr Dois Davenport care.     Sleep medical history and family sleep history: She is the only apnea patient in her  family, no history of sleep walking. Social history: non smoker, non s drinker, 1 cup of coffee daily , disabled. [place]oved [place] 3[place] ago ,   Review of Systems: Out of a complete 14 system review, the patient complains of only the following symptoms, and all other reviewed systems are negative. The patient endorsed the Epworth Sleepiness Scale at 7 points and the fatigue severity scale at 13 points- well below average. Nasal allergic rhinitis, diabetes type 1 but better controlled. Weight gain, chronic back pain she has a spinal cord stimulator 2005 she has "Charcot "-feet and had a bone surgery to the left foot 2010. She had a fracture repaired with hardware in the right leg 2003.   Social History   Socioeconomic History  . Marital status: Widowed    Spouse name: Not on file  . Number of children: Not on file  . Years of education: Not on file  . Highest education level: Not on file  Occupational History  . Occupation: Retired  Scientific laboratory technician  . Financial resource strain: Not on file  . Food insecurity    Worry: Not on file    Inability: Not on file  . Transportation needs    Medical: Not on file    Non-medical: Not on file  Tobacco Use  . Smoking status: Never Smoker  . Smokeless tobacco: Never Used  Substance and Sexual Activity  . Alcohol use: No    Alcohol/week: 0.0 standard drinks  . Drug use: No  . Sexual activity: Yes    Partners: Male   Lifestyle  . Physical activity    Days per week: Not on file    Minutes per session: Not on file  . Stress: Not on file  Relationships  . Social Herbalist on phone: Not on file    Gets together: Not on file    Attends religious service: Not on file    Active member of club or organization: Not on file    Attends meetings of clubs or organizations: Not on file    Relationship status: Not on file  . Intimate partner violence    Fear of current or ex partner: Not on file    Emotionally abused: Not on file    Physically abused: Not on file    Forced sexual activity: Not on file  Other Topics Concern  . Not on file  Social History Narrative   Drinks about 1 cup coffee day.   Cardiac rehab 3x week   One level home with significant other   Bachelors in Accounting    Family History  Problem Relation Age of Onset  . Cancer Mother 17       colon  . Cancer Father        renal cell  . Cancer Brother        bladder  . Cancer Sister        primary brain, and primary breast too  . Cancer Sister        breast   . Cancer Other        multiple aunts with breast, cervical, or colon cancer    Past Medical History:  Diagnosis Date  . Adult RDS ([place])   . Anemia   . Brain aneurysm    frontal lobe  . CAD (coronary artery disease)    a. 02/2018: s/p DES to Proximal LAD with residual 20% RCA stenosis.  . Chronic pain   . Diabetes mellitus  without complication ([place])   . Headache   . History of left bundle branch block (LBBB)   . HTN (hypertension)   . Hx of cardiovascular stress test 10/2016   intermediate risk study  . Hypothyroidism   . Neuromuscular disorder ([place])   . Neuropathy   . Obesity   . OSA on CPAP 03/10/2015  . Retinopathy   . Sleep apnea   . Varicose veins of both lower extremities     Past Surgical History:  Procedure Laterality Date  . BIOPSY  11/14/2016   Procedure: BIOPSY;  Surgeon: Danie Binder, MD;  Location: [facility];  Service:  Endoscopy;;  colon gastric duodenal  . BREAST SURGERY Left 1994   Lumpectomy  . COLONOSCOPY WITH PROPOFOL N/A 11/14/2016   Dr. Oneida Alar: Moderately redundant rectosigmoid colon. Random colon biopsies benign. Internal hemorrhoids. Surveillance colonoscopy in 5 years.  . CORONARY STENT INTERVENTION N/A 03/22/2018   Procedure: CORONARY STENT INTERVENTION;  Surgeon: Burnell Blanks, MD;  Location: [place];  Service: Cardiovascular;  Laterality: N/A;  . ESOPHAGOGASTRODUODENOSCOPY (EGD) WITH PROPOFOL N/A 11/14/2016   Dr. Oneida Alar: Esophagus normal. Moderate gastritis, few gastric polyps. Duodenal biopsies negative. Fundic gland polyp gastric polyp area no H pylori.  Marland Kitchen FOOT SURGERY    . LEFT HEART CATH AND CORONARY ANGIOGRAPHY N/A 03/22/2018   Procedure: LEFT HEART CATH AND CORONARY ANGIOGRAPHY;  Surgeon: Burnell Blanks, MD;  Location: [facility];  Service: Cardiovascular;  Laterality: N/A;  . LEG SURGERY Right   . REPLACEMENT TOTAL KNEE Right   . SPINAL CORD STIMULATOR IMPLANT      Current Outpatient Medications  Medication Sig Dispense Refill  . aspirin EC 81 MG tablet Take 81 mg by mouth daily.    Marland Kitchen atorvastatin (LIPITOR) 80 MG tablet TAKE ONE TABLET BY MOUTH DAILY AT 6 PM 90 tablet 3  . carvedilol (COREG) 3.125 MG tablet Take 1 tablet (3.125 mg) 1 hour before cardiac rehab 90 tablet 3  . Cholecalciferol (VITAMIN D-3) 5000 units TABS Take 5,000 Units by mouth daily.     Marland Kitchen EPINEPHrine 0.3 mg/0.3 mL IJ SOAJ injection Inject 0.3 mg into the muscle as needed.  1  . gabapentin (NEURONTIN) 400 MG capsule Take 400 mg by mouth at bedtime.    . gabapentin (NEURONTIN) 800 MG tablet Take 800 mg by mouth at bedtime.    . Galcanezumab-gnlm (EMGALITY) 120 MG/ML SOAJ Inject 120 mg into the skin every 30 (thirty) days. 1 pen 11  . Insulin Human (INSULIN PUMP) SOLN Inject into the skin continuous.     Marland Kitchen levothyroxine (SYNTHROID, LEVOTHROID) 125 MCG tablet Take 125 mcg by mouth  daily before breakfast.   0  . lisinopril (ZESTRIL) 10 MG tablet Take 10 mg by mouth daily.    . metFORMIN (GLUCOPHAGE) 500 MG tablet Take 1 tablet (500 mg total) by mouth 2 (two) times daily with a meal. Do not take 03/23/2018 and 03/24/2018. Restart taking from 03/25/2018. (Patient taking differently: Take 500 mg by mouth 2 (two) times daily with a meal. )    . Multiple Vitamins-Minerals (CENTRUM SILVER 50+WOMEN) TABS Take 1 tablet by mouth daily.     . nitroGLYCERIN (NITROSTAT) 0.4 MG SL tablet Place 1 tablet (0.4 mg total) under the tongue every 5 (five) minutes as needed for chest pain. 25 tablet 3  . NOVOLOG 100 UNIT/ML injection Inject into the skin continuous.   0  . Omega-3 Fatty Acids (FISH OIL) 1000 MG CAPS  Take 1,000 mg by mouth daily.    . Polyethylene Glycol 400 (BLINK TEARS) 0.25 % SOLN Apply 1 drop to eye daily as needed (for eye irritation/dryness).    . topiramate (TOPAMAX) 200 MG tablet Take 200 mg by mouth daily.    . furosemide (LASIX) 20 MG tablet Take 1 tablet (20 mg total) by mouth as needed for up to 30 days. 30 tablet 3  . pantoprazole (PROTONIX) 40 MG tablet Take 1 tablet (40 mg total) by mouth 2 (two) times daily before a meal. 60 tablet 0   No current facility-administered medications for this visit.     Allergies as of 04/24/2019 - Review Complete 04/24/2019  Allergen Reaction Noted  . Fentanyl Nausea And Vomiting and Other (See Comments) 11/09/2016    Vitals: BP 136/67   Pulse 88   Temp (!) 97.1 F (36.2 C)   Ht _0  (1.727 m)   Wt 236 lb (107 kg)   BMI 35.88 kg/m  Last Weight:  Wt Readings from Last 1 Encounters:  04/24/19 236 lb (107 kg)   MMN:OTRR mass index is 35.88 kg/m.     Last Height:   Ht Readings from Last 1 Encounters:  04/24/19 _1  (1.727 m)    Physical exam:  General: The patient is awake, alert and appears not in acute distress. The patient is well groomed. Head: Normocephalic, atraumatic. Neck is supple. Mallampati 4 ,  poor  dental status.  neck circumference:15. Nasal airflow unrestricted , TMJ is not evident . Retrognathia is seen.  Cardiovascular:  Regular rate and rhythm, without  murmurs or carotid bruit, and without distended neck veins. Respiratory: Lungs are clear to auscultation. Skin:  Without evidence of edema, or rash Trunk: BMI is elevated . The patient's posture is hunched    Neurologic exam : The patient is awake and alert, oriented to place and time.   Memory subjective described as intact.  Attention span & concentration ability appears normal.  Speech is fluent,  without dysarthria, dysphonia or aphasia.  Cranial nerves:  Taste and smell intact .  Pupils are equal and briskly reactive to light. Visual fields by finger perimetry are intact. Hearing to finger rub intact. Facial motor strength is symmetric and tongue and uvula move midline. Shoulder shrug was symmetrical.  Gait and station: Patient walks with a cane.  The patient was advised of the nature of the diagnosed sleep disorder , the treatment options and risks for general a health and wellness arising from not treating the condition.  I spent more than 15 minutes of face to face time with the patient. Greater than 50% of time was spent in counseling and coordination of care. We have discussed the diagnosis and differential and I answered the patient's questions.     Assessment:  After physical and neurologic examination, review of laboratory studies,  Personal review of imaging studies, reports of other /same  Imaging studies ,  Results of polysomnography/ neurophysiology testing and pre-existing records as far as provided in visit., my assessment is:   1) patient with a diagnosis of obstructive sleep apnea also her original test results are not available. She will hava carried this diagnosis for over 13 years now and needed new supplies as her machine was still working well. -  she has been treated for CPAP on an auto titrating machine   and received a new machine just in December 2015, prescribed by Dr. Redmond Pulling. She is due for a new machine by 2021-  next revisit.    Based on her downloads,  she is 100% compliant, uses a set pressure of 10.6 cm water with 2 cm EPR and has a remarkably low residual AHI of only 0.6 per hour. We will continue to prescribe the supplies she needs. Her main risk factor is obesity- and muscle relaxant therapy .   She was told she needs a medical device card to fly with her carry on CPAP.   DME is [facility]-   Linzie Collin, RRT-PSG.  I will contact [facility]'s Linzie Collin to do the arrangements,   Rv in 6 month with either me or NP for new machine order- will need to clarify if a HST is sufficient to  document the need of PAP therapy for MEDICARE coverage.   Sincerely,  Larey Seat MD  04/24/2019   CC: Monico Blitz, [place],  [place] [address]

## 2019-04-25 DIAGNOSIS — J301 Allergic rhinitis due to pollen: Secondary | ICD-10-CM | POA: Diagnosis not present

## 2019-04-28 DIAGNOSIS — J301 Allergic rhinitis due to pollen: Secondary | ICD-10-CM | POA: Diagnosis not present

## 2019-04-29 DIAGNOSIS — Z1231 Encounter for screening mammogram for malignant neoplasm of breast: Secondary | ICD-10-CM | POA: Diagnosis not present

## 2019-04-30 DIAGNOSIS — J301 Allergic rhinitis due to pollen: Secondary | ICD-10-CM | POA: Diagnosis not present

## 2019-05-01 DIAGNOSIS — Z862 Personal history of diseases of the blood and blood-forming organs and certain disorders involving the immune mechanism: Secondary | ICD-10-CM | POA: Diagnosis not present

## 2019-05-01 DIAGNOSIS — E871 Hypo-osmolality and hyponatremia: Secondary | ICD-10-CM | POA: Diagnosis not present

## 2019-05-01 DIAGNOSIS — R809 Proteinuria, unspecified: Secondary | ICD-10-CM | POA: Diagnosis not present

## 2019-05-01 DIAGNOSIS — E1122 Type 2 diabetes mellitus with diabetic chronic kidney disease: Secondary | ICD-10-CM | POA: Diagnosis not present

## 2019-05-01 DIAGNOSIS — E1129 Type 2 diabetes mellitus with other diabetic kidney complication: Secondary | ICD-10-CM | POA: Diagnosis not present

## 2019-05-01 DIAGNOSIS — E875 Hyperkalemia: Secondary | ICD-10-CM | POA: Diagnosis not present

## 2019-05-01 DIAGNOSIS — N189 Chronic kidney disease, unspecified: Secondary | ICD-10-CM | POA: Diagnosis not present

## 2019-05-02 DIAGNOSIS — J301 Allergic rhinitis due to pollen: Secondary | ICD-10-CM | POA: Diagnosis not present

## 2019-05-05 DIAGNOSIS — J301 Allergic rhinitis due to pollen: Secondary | ICD-10-CM | POA: Diagnosis not present

## 2019-05-07 DIAGNOSIS — J301 Allergic rhinitis due to pollen: Secondary | ICD-10-CM | POA: Diagnosis not present

## 2019-05-09 DIAGNOSIS — J301 Allergic rhinitis due to pollen: Secondary | ICD-10-CM | POA: Diagnosis not present

## 2019-05-12 ENCOUNTER — Other Ambulatory Visit: Payer: Self-pay

## 2019-05-12 ENCOUNTER — Ambulatory Visit (INDEPENDENT_AMBULATORY_CARE_PROVIDER_SITE_OTHER): Payer: Medicare Other | Admitting: Otolaryngology

## 2019-05-12 DIAGNOSIS — H608X3 Other otitis externa, bilateral: Secondary | ICD-10-CM

## 2019-05-12 DIAGNOSIS — J301 Allergic rhinitis due to pollen: Secondary | ICD-10-CM | POA: Diagnosis not present

## 2019-05-13 DIAGNOSIS — E1140 Type 2 diabetes mellitus with diabetic neuropathy, unspecified: Secondary | ICD-10-CM | POA: Diagnosis not present

## 2019-05-13 DIAGNOSIS — E1151 Type 2 diabetes mellitus with diabetic peripheral angiopathy without gangrene: Secondary | ICD-10-CM | POA: Diagnosis not present

## 2019-05-14 DIAGNOSIS — J301 Allergic rhinitis due to pollen: Secondary | ICD-10-CM | POA: Diagnosis not present

## 2019-05-16 DIAGNOSIS — J301 Allergic rhinitis due to pollen: Secondary | ICD-10-CM | POA: Diagnosis not present

## 2019-05-16 DIAGNOSIS — E1142 Type 2 diabetes mellitus with diabetic polyneuropathy: Secondary | ICD-10-CM | POA: Diagnosis not present

## 2019-05-16 DIAGNOSIS — E1165 Type 2 diabetes mellitus with hyperglycemia: Secondary | ICD-10-CM | POA: Diagnosis not present

## 2019-05-16 DIAGNOSIS — I10 Essential (primary) hypertension: Secondary | ICD-10-CM | POA: Diagnosis not present

## 2019-05-16 DIAGNOSIS — Z6841 Body Mass Index (BMI) 40.0 and over, adult: Secondary | ICD-10-CM | POA: Diagnosis not present

## 2019-05-16 DIAGNOSIS — Z299 Encounter for prophylactic measures, unspecified: Secondary | ICD-10-CM | POA: Diagnosis not present

## 2019-05-16 DIAGNOSIS — Z6836 Body mass index (BMI) 36.0-36.9, adult: Secondary | ICD-10-CM | POA: Diagnosis not present

## 2019-05-19 DIAGNOSIS — J301 Allergic rhinitis due to pollen: Secondary | ICD-10-CM | POA: Diagnosis not present

## 2019-05-21 DIAGNOSIS — J301 Allergic rhinitis due to pollen: Secondary | ICD-10-CM | POA: Diagnosis not present

## 2019-05-23 DIAGNOSIS — J301 Allergic rhinitis due to pollen: Secondary | ICD-10-CM | POA: Diagnosis not present

## 2019-05-26 DIAGNOSIS — J301 Allergic rhinitis due to pollen: Secondary | ICD-10-CM | POA: Diagnosis not present

## 2019-05-30 DIAGNOSIS — J301 Allergic rhinitis due to pollen: Secondary | ICD-10-CM | POA: Diagnosis not present

## 2019-06-02 DIAGNOSIS — J301 Allergic rhinitis due to pollen: Secondary | ICD-10-CM | POA: Diagnosis not present

## 2019-06-04 DIAGNOSIS — J301 Allergic rhinitis due to pollen: Secondary | ICD-10-CM | POA: Diagnosis not present

## 2019-06-09 DIAGNOSIS — J301 Allergic rhinitis due to pollen: Secondary | ICD-10-CM | POA: Diagnosis not present

## 2019-06-11 DIAGNOSIS — J301 Allergic rhinitis due to pollen: Secondary | ICD-10-CM | POA: Diagnosis not present

## 2019-06-13 DIAGNOSIS — I10 Essential (primary) hypertension: Secondary | ICD-10-CM | POA: Diagnosis not present

## 2019-06-13 DIAGNOSIS — E669 Obesity, unspecified: Secondary | ICD-10-CM | POA: Diagnosis not present

## 2019-06-13 DIAGNOSIS — I2510 Atherosclerotic heart disease of native coronary artery without angina pectoris: Secondary | ICD-10-CM | POA: Diagnosis not present

## 2019-06-13 DIAGNOSIS — E1140 Type 2 diabetes mellitus with diabetic neuropathy, unspecified: Secondary | ICD-10-CM | POA: Diagnosis not present

## 2019-06-13 DIAGNOSIS — E11319 Type 2 diabetes mellitus with unspecified diabetic retinopathy without macular edema: Secondary | ICD-10-CM | POA: Diagnosis not present

## 2019-06-13 DIAGNOSIS — E785 Hyperlipidemia, unspecified: Secondary | ICD-10-CM | POA: Diagnosis not present

## 2019-06-13 DIAGNOSIS — Z794 Long term (current) use of insulin: Secondary | ICD-10-CM | POA: Diagnosis not present

## 2019-06-13 DIAGNOSIS — E039 Hypothyroidism, unspecified: Secondary | ICD-10-CM | POA: Diagnosis not present

## 2019-06-13 DIAGNOSIS — E118 Type 2 diabetes mellitus with unspecified complications: Secondary | ICD-10-CM | POA: Diagnosis not present

## 2019-06-13 DIAGNOSIS — M1460 Charcot's joint, unspecified site: Secondary | ICD-10-CM | POA: Diagnosis not present

## 2019-06-13 DIAGNOSIS — Z9641 Presence of insulin pump (external) (internal): Secondary | ICD-10-CM | POA: Diagnosis not present

## 2019-06-13 DIAGNOSIS — J301 Allergic rhinitis due to pollen: Secondary | ICD-10-CM | POA: Diagnosis not present

## 2019-06-13 DIAGNOSIS — E109 Type 1 diabetes mellitus without complications: Secondary | ICD-10-CM | POA: Diagnosis not present

## 2019-06-17 DIAGNOSIS — J301 Allergic rhinitis due to pollen: Secondary | ICD-10-CM | POA: Diagnosis not present

## 2019-06-19 DIAGNOSIS — J301 Allergic rhinitis due to pollen: Secondary | ICD-10-CM | POA: Diagnosis not present

## 2019-06-21 DIAGNOSIS — J301 Allergic rhinitis due to pollen: Secondary | ICD-10-CM | POA: Diagnosis not present

## 2019-06-23 DIAGNOSIS — J301 Allergic rhinitis due to pollen: Secondary | ICD-10-CM | POA: Diagnosis not present

## 2019-06-25 DIAGNOSIS — J301 Allergic rhinitis due to pollen: Secondary | ICD-10-CM | POA: Diagnosis not present

## 2019-06-27 DIAGNOSIS — J301 Allergic rhinitis due to pollen: Secondary | ICD-10-CM | POA: Diagnosis not present

## 2019-06-30 DIAGNOSIS — J301 Allergic rhinitis due to pollen: Secondary | ICD-10-CM | POA: Diagnosis not present

## 2019-07-02 DIAGNOSIS — J301 Allergic rhinitis due to pollen: Secondary | ICD-10-CM | POA: Diagnosis not present

## 2019-07-04 DIAGNOSIS — J301 Allergic rhinitis due to pollen: Secondary | ICD-10-CM | POA: Diagnosis not present

## 2019-07-07 DIAGNOSIS — J301 Allergic rhinitis due to pollen: Secondary | ICD-10-CM | POA: Diagnosis not present

## 2019-07-09 DIAGNOSIS — J301 Allergic rhinitis due to pollen: Secondary | ICD-10-CM | POA: Diagnosis not present

## 2019-07-10 NOTE — Progress Notes (Signed)
Virtual Visit via Telephone Note The purpose of this virtual visit is to provide medical care while limiting exposure to the novel coronavirus.    Consent was obtained for phone visit:  Yes Answered questions that patient had about telehealth interaction:  Yes I discussed the limitations, risks, security and privacy concerns of performing an evaluation and management service by telephone. I also discussed with the patient that there may be a patient responsible charge related to this service. The patient expressed understanding and agreed to proceed.  Pt location: Home Physician Location: office Name of referring provider:  Monico Blitz, MD I connected with .Jiyah Torpey Rosamond at patients initiation/request on 07/14/2019 at 10:10 AM EST by telephone and verified that I am speaking with the correct person using two identifiers.  Pt MRN:  [id number] Pt DOB:  Jul 07, 1961   History of Present Illness:  Jodi Ray is a 8 year oldhanded woman with chronic pain related to complex regional pain syndrome (with spinal stimulator), hypertension, type 1 diabetes mellitus with polyneuropathy, OSA on CPAP, hypothyroidism, polymyalgia rheumatica who follows up for cluster headache.  UPDATE: He reports headaches are getting worse over the past couple of months, ice-pick and cluster headaches.   The lidocaine NS no longer helping.  She has been participating in cardiac rehab.  They are severe, lasting 4 to 10 hours and occurring 2 days a week.  She reports loss of balance, nausea, photophobia.  Change in weather is a significant trigger.  Current NSAIDs: ASA 81mg  daily. Current analgesics: Lidocaine nasal spray Current antihypertensive:  Lisinopril, Lasix, Coreg Current anti-epileptic:topiramate 200mg  at bedtime, gabapentin1200mg at bedtime Current anti-CGRP:  Emgality Supplements:  Melatonin 20mg   04/21/2019 Renal function panel:  CO2  HISTORY: IPrimary Stabbing Headache:  Onset:2011.She was previously treated in [place], where she was diagnosed with primary stabbing headache. Location:Right frontal region Quality:stabbing Intensity:10/10 Aura:no Prodrome:no Associated symptoms:Some nausea if severe. No autonomic symptoms. Duration:10 to 60 minutes Frequency:4 times a week Triggers/exacerbating factors:change in weather. Relieving factors:none Activity:Cannot function when experiencing it.  II Cluster headache:She began havingnew intractableheadaches in late Decemeber2019.Her right eye gets bloodshot and right nare runs. Right side of face gets red. They stabbing headache starts above the right eye and radiates, constant for a month, fluctuating in intensity. [facility] on 08/11/18. CT of head and CTA of head and neck were performed and personally reviewed, showing no acute abnormality or aneurysm.Sed rate from 08/22/18 was 24.  Past management: Prednisone taper, headache cocktail  IIIMigraines:They are bi-frontal/maxillary and associated with slight nausea.She has had this before when her allergies "act up."  IVDiabetic neuropathy: Secondary to Type 1 diabetes.  VComplex regional pain syndrome: following an accident where she fell down the steps and crushed her right leg.She takes gabapentin, tramadol and has a spinal nerve stimulator.She takes this for her painful diabetic neuropathy as well.  VIRight arm pain and numbness: Since 2015, she has had episodes of right arm pain and numbness. It only occurs when she is laying in bed, and occurs no matter what position. Her entire arm goes "dead". It is both numb, painful and unable to move it. There is no shooting pain down the arm from the neck, however she gets occasional shooting pain from the right side of her neck into the shoulder. She has to use her other arm to shake it out and it resolves in a minute or  two. It occurs every night. She was sent to pain management for  possible cervical radiculopathy or thoracic outlet syndrome. She did receive injections, which helped. She had an MRI of the cervical and thoracic spine on 06/12/14. Imaging not available, but report mentions diffuse facet arthropathy and degenerative changes in the cervical spine, but no nerve root impingement or cord compression. There was no evidence of thoracic outlet syndrome. She was sent to [facility] for [facility] evaluation. 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 They told her that her symptoms were related to her diabetic neuropathy.  VIICerebral aneurysm: In 2009, she had a CTA of the head which reportedly showed a 1-77mm an eurysm at the junction of right A1 and A2 segment of the ACA. However, a repeat CTA performed on 02/22/12 did not reveal any aneurysm.  She cannot have an MRI due to spinal stimulator.  Past medictions: NSAIDs:  Indomethacin 25mg  three times daily (effective but discontinued due to elevated liver enzymes) Past anthypertensive medication:  Verapamil (ineffective) Past antiepileptics:  Trokendi XR (effective but expensive)    Observations/Objective:     Assessment and Plan:   1.  Episodic cluster headache and ice-pick headaches  1.  For preventative management, titrate topiramate to 300mg  at bedtime and continue Emgality every 30 days 2.  For abortive therapy, will prescribe 100% O2 15 L/min for 15-20 minutes. 3.  Limit use of pain relievers to no more than 2 days out of week to prevent risk of rebound or medication-overuse headache. 4.  Keep headache diary 5.  Exercise, hydration, caffeine cessation, sleep hygiene, monitor for and avoid triggers 6.  Consider:  magnesium citrate 400mg  daily, riboflavin 400mg  daily, and coenzyme Q10 100mg  three  times daily 7. Follow up 4 months     Follow Up Instructions:    -I discussed the assessment and treatment plan with the patient. The patient was provided an opportunity to ask questions and all were answered. The patient agreed with the plan and demonstrated an understanding of the instructions.   The patient was advised to call back or seek an in-person evaluation if the symptoms worsen or if the condition fails to improve as anticipated.    Total Time spent in visit with the patient was:  16 minutes   Dudley Major, DO

## 2019-07-11 DIAGNOSIS — J301 Allergic rhinitis due to pollen: Secondary | ICD-10-CM | POA: Diagnosis not present

## 2019-07-14 ENCOUNTER — Telehealth (INDEPENDENT_AMBULATORY_CARE_PROVIDER_SITE_OTHER): Payer: Medicare Other | Admitting: Neurology

## 2019-07-14 ENCOUNTER — Encounter: Payer: Self-pay | Admitting: Neurology

## 2019-07-14 ENCOUNTER — Other Ambulatory Visit: Payer: Self-pay

## 2019-07-14 VITALS — Ht 68.0 in | Wt 219.0 lb

## 2019-07-14 DIAGNOSIS — J301 Allergic rhinitis due to pollen: Secondary | ICD-10-CM | POA: Diagnosis not present

## 2019-07-14 DIAGNOSIS — G8929 Other chronic pain: Secondary | ICD-10-CM | POA: Diagnosis not present

## 2019-07-14 DIAGNOSIS — G44001 Cluster headache syndrome, unspecified, intractable: Secondary | ICD-10-CM

## 2019-07-14 DIAGNOSIS — G44019 Episodic cluster headache, not intractable: Secondary | ICD-10-CM

## 2019-07-14 MED ORDER — TOPIRAMATE 100 MG PO TABS: ORAL_TABLET | ORAL | 0 refills | Status: DC

## 2019-07-16 DIAGNOSIS — J301 Allergic rhinitis due to pollen: Secondary | ICD-10-CM | POA: Diagnosis not present

## 2019-07-18 ENCOUNTER — Telehealth: Payer: Self-pay | Admitting: Neurology

## 2019-07-18 NOTE — Telephone Encounter (Signed)
Noted Will wait for forms to be fax from [place] to complete orders for  patient oxygen Please be aware

## 2019-07-18 NOTE — Telephone Encounter (Signed)
Jodi Ray is calling to follow up with the Nurse regarding this patient and Dr Tomi Likens ordering Oxygen for her. They have faxed over a paper to have Dr. Tomi Likens sign and date and then to please fax it back. Thank you

## 2019-07-18 NOTE — Telephone Encounter (Signed)
Please see

## 2019-07-18 NOTE — Telephone Encounter (Signed)
refaxing referral  Called [facility] spoke with Tiffany once received they will reach out to patient.   Pt informed of this.

## 2019-07-18 NOTE — Telephone Encounter (Signed)
Patient called and said [facility] is still telling her they don't have an order for oxygen for her. She expressed frustration and requested help. Please see last MyChart message for more details.

## 2019-07-18 NOTE — Telephone Encounter (Signed)
[employer] please see

## 2019-07-18 NOTE — Telephone Encounter (Addendum)
Patient called and said her oxygen will not be covered by Medicare for cluster headaches but will be covered by her [employer]. She said Dr. Tomi Likens will need to rewrite the prescription and [facility] has faxed the office a form to help expedite this process, per patient.   See telephone note from earlier today for reference.

## 2019-07-21 ENCOUNTER — Other Ambulatory Visit: Payer: Self-pay

## 2019-07-21 DIAGNOSIS — E119 Type 2 diabetes mellitus without complications: Secondary | ICD-10-CM | POA: Diagnosis not present

## 2019-07-21 DIAGNOSIS — I10 Essential (primary) hypertension: Secondary | ICD-10-CM | POA: Diagnosis not present

## 2019-07-21 DIAGNOSIS — M159 Polyosteoarthritis, unspecified: Secondary | ICD-10-CM | POA: Diagnosis not present

## 2019-07-21 NOTE — Telephone Encounter (Signed)
[employer] took care of this on Friday the 18

## 2019-07-21 NOTE — Telephone Encounter (Signed)
Patient states that they have not received anything from our office and we need to refax the  RX for the oxygen

## 2019-07-21 NOTE — Telephone Encounter (Signed)
Form to come over per [employer] per Estill Bamberg.

## 2019-07-31 ENCOUNTER — Telehealth: Payer: Self-pay | Admitting: Cardiovascular Disease

## 2019-07-31 NOTE — Telephone Encounter (Signed)
CARDIAC REHAB Patient is upset that rehab has closed.  Would like to be referred to another rehab center that she can physically do rehab and not do it virtually.

## 2019-07-31 NOTE — Telephone Encounter (Signed)
Can we check with [facility] to see if they have access to cardiac rehab there?   Zandra Abts MD

## 2019-08-05 DIAGNOSIS — I10 Essential (primary) hypertension: Secondary | ICD-10-CM | POA: Diagnosis not present

## 2019-08-05 DIAGNOSIS — E119 Type 2 diabetes mellitus without complications: Secondary | ICD-10-CM | POA: Diagnosis not present

## 2019-08-05 DIAGNOSIS — M159 Polyosteoarthritis, unspecified: Secondary | ICD-10-CM | POA: Diagnosis not present

## 2019-08-07 ENCOUNTER — Telehealth: Payer: Self-pay | Admitting: Cardiovascular Disease

## 2019-08-07 NOTE — Telephone Encounter (Signed)
Called and spoke to patient. Patient expresses great frustration that Cardiac Rehab at [facility] has been closed for onsite rehab. She states that she has spoken with several people regarding this issue and has not heard anything back. Made patient aware that Dr. Harl Bowie said that we could check with [facility] to see if they have access to Cardiac Rehab there. Patient states that she is willing to drive anywhere that she needs to go to be onsite and not virtual.  I attempted to reach out to [facility] without success. I made several calls to other sites to see if there were any sites still doing Cardiac Rehab onsite. [facility] in [place] is still doing onsite cardiac rehab. She said that there is a little delay in getting patients set up with the smaller classes. They will need referral faxed to them at [phone].  Called and made the patient aware and she agrees to go to [facility] in [place] until [facility] opens back up. Made her aware that I will send referral form to [facility] office to have either Dr. Harl Bowie or Dr. Bronson Ing sign and then fax over. Patient verbalized understanding and thanked me for the call.

## 2019-08-07 NOTE — Telephone Encounter (Signed)
See telephone encounter from 12/31

## 2019-08-07 NOTE — Telephone Encounter (Signed)
Pt called requesting to speak w/ nurse

## 2019-08-08 DIAGNOSIS — J301 Allergic rhinitis due to pollen: Secondary | ICD-10-CM | POA: Diagnosis not present

## 2019-08-08 NOTE — Telephone Encounter (Signed)
I faxed to [facility] office the cardiac rehab form for Dr.koneswaran to complete and sign.front office staff, Loletha Carrow, has the ability there to send as e-mail to [place].

## 2019-08-11 DIAGNOSIS — J301 Allergic rhinitis due to pollen: Secondary | ICD-10-CM | POA: Diagnosis not present

## 2019-08-12 NOTE — Telephone Encounter (Signed)
Cardiac rehab form was signed at [facility] office and faxed from there.

## 2019-08-15 ENCOUNTER — Telehealth: Payer: Self-pay

## 2019-08-15 DIAGNOSIS — J301 Allergic rhinitis due to pollen: Secondary | ICD-10-CM | POA: Diagnosis not present

## 2019-08-15 NOTE — Telephone Encounter (Signed)
Call placed reference referral for PREP Patient has been in cardiac rehab since 02/2018 at [facility]. Looking for a way of maintaining weight loss and exercise gains. Has some issues with Charcot feet which prohibits her walking. Has been to the [facility] but they do not accommodate her O2 tank. Explained program. We will not be starting a new group until Feb. Will place her on list and confirm that [facility] will accommodate O2. Not interested in participating in online fitness at this time. Will call her when I have a date set for the next PREP class.

## 2019-08-18 DIAGNOSIS — J301 Allergic rhinitis due to pollen: Secondary | ICD-10-CM | POA: Diagnosis not present

## 2019-08-20 DIAGNOSIS — J301 Allergic rhinitis due to pollen: Secondary | ICD-10-CM | POA: Diagnosis not present

## 2019-08-22 DIAGNOSIS — J301 Allergic rhinitis due to pollen: Secondary | ICD-10-CM | POA: Diagnosis not present

## 2019-08-25 DIAGNOSIS — J301 Allergic rhinitis due to pollen: Secondary | ICD-10-CM | POA: Diagnosis not present

## 2019-08-25 DIAGNOSIS — E113593 Type 2 diabetes mellitus with proliferative diabetic retinopathy without macular edema, bilateral: Secondary | ICD-10-CM | POA: Diagnosis not present

## 2019-08-27 DIAGNOSIS — J301 Allergic rhinitis due to pollen: Secondary | ICD-10-CM | POA: Diagnosis not present

## 2019-08-29 DIAGNOSIS — J301 Allergic rhinitis due to pollen: Secondary | ICD-10-CM | POA: Diagnosis not present

## 2019-08-29 NOTE — Progress Notes (Signed)
Received VMF patient advising Cardiac Rehab at [facility] is reopening on Monday and will not participate in PREP for now. She has number to contact in future if needed.

## 2019-09-01 DIAGNOSIS — J301 Allergic rhinitis due to pollen: Secondary | ICD-10-CM | POA: Diagnosis not present

## 2019-09-02 ENCOUNTER — Ambulatory Visit: Payer: Medicare Other | Admitting: Neurology

## 2019-09-04 ENCOUNTER — Other Ambulatory Visit: Payer: Self-pay | Admitting: Neurology

## 2019-09-08 DIAGNOSIS — J301 Allergic rhinitis due to pollen: Secondary | ICD-10-CM | POA: Diagnosis not present

## 2019-09-10 DIAGNOSIS — J301 Allergic rhinitis due to pollen: Secondary | ICD-10-CM | POA: Diagnosis not present

## 2019-09-12 DIAGNOSIS — J301 Allergic rhinitis due to pollen: Secondary | ICD-10-CM | POA: Diagnosis not present

## 2019-09-15 ENCOUNTER — Telehealth: Payer: Self-pay

## 2019-09-15 DIAGNOSIS — J301 Allergic rhinitis due to pollen: Secondary | ICD-10-CM | POA: Diagnosis not present

## 2019-09-15 NOTE — Telephone Encounter (Signed)
Virtual Visit Pre-Appointment Phone Call  "(Name), I am calling you today to discuss your upcoming appointment. We are currently trying to limit exposure to the virus that causes COVID-19 by seeing patients at home rather than in the office."  1. "What is the BEST phone number to call the day of the visit?" - include this in appointment notes  2. "Do you have or have access to (through a family member/friend) a smartphone with video capability that we can use for your visit?" a. If yes - list this number in appt notes as "cell" (if different from BEST phone #) and list the appointment type as a VIDEO visit in appointment notes b. If no - list the appointment type as a PHONE visit in appointment notes  Confirm consent - "In the setting of the current Covid19 crisis, you are scheduled for a (phone or video) visit with your provider on (date) at (time).  Just as we do with many in-office visits, in order for you to participate in this visit, we must obtain consent.  If you'd like, I can send this to your mychart (if signed up) or email for you to review.  Otherwise, I can obtain your verbal consent now.  All virtual visits are billed to your insurance company just like a normal visit would be.  By agreeing to a virtual visit, we'd like you to understand that the technology does not allow for your provider to perform an examination, and thus may limit your provider's ability to fully assess your condition. If your provider identifies any concerns that need to be evaluated in person, we will make arrangements to do so.  Finally, though the technology is pretty good, we cannot assure that it will always work on either your or our end, and in the setting of a video visit, we may have to convert it to a phone-only visit.  In either situation, we cannot ensure that we have a secure connection.  Are you willing to proceed?" STAFF: Did the patient verbally acknowledge consent to telehealth visit? Document  YES/NO here: 3. Advise patient to be prepared - "Two hours prior to your appointment, go ahead and check your blood pressure, pulse, oxygen saturation, and your weight (if you have the equipment to check those) and write them all down. When your visit starts, your provider will ask you for this information. If you have an Apple Watch or Kardia device, please plan to have heart rate information ready on the day of your appointment. Please have a pen and paper handy nearby the day of the visit as well."  4. Give patient instructions for MyChart download to smartphone OR Doximity/Doxy.me as below if video visit (depending on what platform provider is using)  5. Inform patient they will receive a phone call 15 minutes prior to their appointment time (may be from unknown caller ID) so they should be prepared to answer    TELEPHONE CALL NOTE  Jodi Ray has been deemed a candidate for a follow-up tele-health visit to limit community exposure during the Covid-19 pandemic. I spoke with the patient via phone to ensure availability of phone/video source, confirm preferred email & phone number, and discuss instructions and expectations.  I reminded Jodi Ray to be prepared with any vital sign and/or heart rhythm information that could potentially be obtained via home monitoring, at the time of her visit. I reminded Jodi Ray to expect a phone call prior to her visit.  Dorothey Baseman 09/15/2019 2:33 PM   INSTRUCTIONS FOR DOWNLOADING THE MYCHART APP TO SMARTPHONE  - The patient must first make sure to have activated MyChart and know their login information - If Apple, go to App Store and type in MyChart in the search bar and download the app. If Android, ask patient to go to [employer] and type in [place] in the search bar and download the app. The app is free but as with any other app downloads, their phone may require them to verify saved payment information or Apple/Android  password.  - The patient will need to then log into the app with their MyChart username and password, and select [facility] as their healthcare provider to link the account. When it is time for your visit, go to the MyChart app, find appointments, and click Ray Video Visit. Be sure to Select Allow for your device to access the Microphone and Camera for your visit. You will then be connected, and your provider will be with you shortly.  **If they have any issues connecting, or need assistance please contact MyChart service desk [phone])**  **If using a computer, in order to ensure the best quality for their visit they will need to use either of the following Internet Browsers: [employer], or Google Chrome**  IF USING DOXIMITY or DOXY.ME - The patient will receive a link just prior to their visit by text.     FULL LENGTH CONSENT FOR TELE-HEALTH VISIT   I hereby voluntarily request, consent and authorize [facility] and its employed or contracted physicians, physician assistants, nurse practitioners or other licensed health care professionals (the Practitioner), to provide me with telemedicine health care services (the "Services") as deemed necessary by the treating Practitioner. I acknowledge and consent to receive the Services by the Practitioner via telemedicine. I understand that the telemedicine visit will involve communicating with the Practitioner through live audiovisual communication technology and the disclosure of certain medical information by electronic transmission. I acknowledge that I have been given the opportunity to request an in-person assessment or other available alternative prior to the telemedicine visit and am voluntarily participating in the telemedicine visit.  I understand that I have the right to withhold or withdraw my consent to the use of telemedicine in the course of my care at any time, without affecting my right to future care or treatment,  and that the Practitioner or I may terminate the telemedicine visit at any time. I understand that I have the right to inspect all information obtained and/or recorded in the course of the telemedicine visit and may receive copies of available information for a reasonable fee.  I understand that some of the potential risks of receiving the Services via telemedicine include:  Marland Kitchen Delay or interruption in medical evaluation due to technological equipment failure or disruption; . Information transmitted may not be sufficient (e.g. poor resolution of images) to allow for appropriate medical decision making by the Practitioner; and/or  . In rare instances, security protocols could fail, causing a breach of personal health information.  Furthermore, I acknowledge that it is my responsibility to provide information about my medical history, conditions and care that is complete and accurate to the best of my ability. I acknowledge that Practitioner's advice, recommendations, and/or decision may be based on factors not within their control, such as incomplete or inaccurate data provided by me or distortions of diagnostic images or specimens that may result from electronic transmissions. I understand that the practice  of medicine is not an Chief Strategy Officer and that Practitioner makes no warranties or guarantees regarding treatment outcomes. I acknowledge that I will receive a copy of this consent concurrently upon execution via email to the email address I last provided but may also request a printed copy by calling the office of [place].    I understand that my insurance will be billed for this visit.   I have read or had this consent read to me. . I understand the contents of this consent, which adequately explains the benefits and risks of the Services being provided via telemedicine.  . I have been provided ample opportunity to ask questions regarding this consent and the Services and have had my questions  answered to my satisfaction. . I give my informed consent for the services to be provided through the use of telemedicine in my medical care  By participating in this telemedicine visit I agree to the above.

## 2019-09-16 DIAGNOSIS — I2510 Atherosclerotic heart disease of native coronary artery without angina pectoris: Secondary | ICD-10-CM | POA: Diagnosis not present

## 2019-09-16 DIAGNOSIS — E039 Hypothyroidism, unspecified: Secondary | ICD-10-CM | POA: Diagnosis not present

## 2019-09-16 DIAGNOSIS — Z9641 Presence of insulin pump (external) (internal): Secondary | ICD-10-CM | POA: Diagnosis not present

## 2019-09-16 DIAGNOSIS — I10 Essential (primary) hypertension: Secondary | ICD-10-CM | POA: Diagnosis not present

## 2019-09-16 DIAGNOSIS — Z794 Long term (current) use of insulin: Secondary | ICD-10-CM | POA: Diagnosis not present

## 2019-09-16 DIAGNOSIS — E1140 Type 2 diabetes mellitus with diabetic neuropathy, unspecified: Secondary | ICD-10-CM | POA: Diagnosis not present

## 2019-09-16 DIAGNOSIS — E785 Hyperlipidemia, unspecified: Secondary | ICD-10-CM | POA: Diagnosis not present

## 2019-09-16 DIAGNOSIS — E118 Type 2 diabetes mellitus with unspecified complications: Secondary | ICD-10-CM | POA: Diagnosis not present

## 2019-09-16 DIAGNOSIS — E11319 Type 2 diabetes mellitus with unspecified diabetic retinopathy without macular edema: Secondary | ICD-10-CM | POA: Diagnosis not present

## 2019-09-16 DIAGNOSIS — E109 Type 1 diabetes mellitus without complications: Secondary | ICD-10-CM | POA: Diagnosis not present

## 2019-09-16 DIAGNOSIS — E669 Obesity, unspecified: Secondary | ICD-10-CM | POA: Diagnosis not present

## 2019-09-16 DIAGNOSIS — M1460 Charcot's joint, unspecified site: Secondary | ICD-10-CM | POA: Diagnosis not present

## 2019-09-17 DIAGNOSIS — J301 Allergic rhinitis due to pollen: Secondary | ICD-10-CM | POA: Diagnosis not present

## 2019-09-19 ENCOUNTER — Telehealth (INDEPENDENT_AMBULATORY_CARE_PROVIDER_SITE_OTHER): Payer: Medicare Other | Admitting: Cardiovascular Disease

## 2019-09-19 ENCOUNTER — Encounter: Payer: Self-pay | Admitting: Cardiovascular Disease

## 2019-09-19 VITALS — BP 112/60 | Ht 68.0 in | Wt 208.0 lb

## 2019-09-19 DIAGNOSIS — I25118 Atherosclerotic heart disease of native coronary artery with other forms of angina pectoris: Secondary | ICD-10-CM | POA: Diagnosis not present

## 2019-09-19 DIAGNOSIS — J301 Allergic rhinitis due to pollen: Secondary | ICD-10-CM | POA: Diagnosis not present

## 2019-09-19 DIAGNOSIS — E785 Hyperlipidemia, unspecified: Secondary | ICD-10-CM

## 2019-09-19 DIAGNOSIS — G4733 Obstructive sleep apnea (adult) (pediatric): Secondary | ICD-10-CM

## 2019-09-19 DIAGNOSIS — Z955 Presence of coronary angioplasty implant and graft: Secondary | ICD-10-CM

## 2019-09-19 DIAGNOSIS — Z9989 Dependence on other enabling machines and devices: Secondary | ICD-10-CM

## 2019-09-19 DIAGNOSIS — I10 Essential (primary) hypertension: Secondary | ICD-10-CM

## 2019-09-19 NOTE — Patient Instructions (Signed)
Medication Instructions:   Your physician recommends that you continue on your current medications as directed. Please refer to the Current Medication list given to you today.  Labwork:  NONE  Testing/Procedures:  NONE  Follow-Up:  Your physician recommends that you schedule a follow-up appointment in: 1 year (virtual). You will receive a reminder letter in the mail in about 10 months reminding you to call and schedule your appointment. If you don't receive this letter, please contact our office.  Any Other Special Instructions Will Be Listed Below (If Applicable).  If you need a refill on your cardiac medications before your next appointment, please call your pharmacy.

## 2019-09-19 NOTE — Progress Notes (Signed)
Virtual Visit via Telephone Note   This visit type was conducted due to national recommendations for restrictions regarding the COVID-19 Pandemic (e.g. social distancing) in an effort to limit this patient's exposure and mitigate transmission in our community.  Due to her co-morbid illnesses, this patient is at least at moderate risk for complications without adequate follow up.  This format is felt to be most appropriate for this patient at this time.  The patient did not have access to video technology/had technical difficulties with video requiring transitioning to audio format only (telephone).  All issues noted in this document were discussed and addressed.  No physical exam could be performed with this format.  Please refer to the patient's chart for her  consent to telehealth for [facility].   Date:  09/19/2019   ID:  Jodi Ray, DOB [id number], MRN [id number]  Patient Location: Home Provider Location: Home  PCP:  Monico Blitz, MD  Cardiologist:  Kate Sable, MD  Electrophysiologist:  None   Evaluation Performed:  Follow-Up Visit  Chief Complaint:  CAD  History of Present Illness:    Jodi Ray is a 59 y.o. female with coronary artery disease and hypertension.  She underwent drug-eluting stent placement to the proximal LAD on 03/22/2018.  She has been participating regularly in cardiac rehabilitation.  She follows with endocrinology. She's lost 54 lbs since she first saw me.  She enjoys cardiac rehabilitation and told me about how much she enjoys the program at [facility].  She infrequently has chest pains lasting seconds and occur at rest.  She denies shortness of breath, palpitations, fevers, chills, leg swelling, and dizziness.  The patient does not have symptoms concerning for COVID-19 infection (fever, chills, cough, or new shortness of breath).   Social history:She is originally from [place]. She used to be a Forensic psychologist. She knows  [place] very well.  Past Medical History:  Diagnosis Date  . 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 Anemia   . Brain aneurysm    frontal lobe  . CAD (coronary artery disease)    a. 02/2018: s/p DES to Proximal LAD with residual 20% RCA stenosis.  . Chronic pain   . Diabetes mellitus without complication ([place])   . Headache   . History of left bundle branch block (LBBB)   . HTN (hypertension)   . Hx of cardiovascular stress test 10/2016   intermediate risk study  . Hypothyroidism   . Neuromuscular disorder ([place])   . Neuropathy   . Obesity   . OSA on CPAP 03/10/2015  . Retinopathy   . Sleep apnea   . Varicose veins of both lower extremities    Past Surgical History:  Procedure Laterality Date  . BIOPSY  11/14/2016   Procedure: BIOPSY;  Surgeon: Danie Binder, MD;  Location: [facility] ENDO SUITE;  Service: Endoscopy;;  colon gastric duodenal  . BREAST SURGERY Left 1994   Lumpectomy  . COLONOSCOPY WITH PROPOFOL N/A 11/14/2016   Dr. Oneida Alar: Moderately redundant rectosigmoid colon. Random colon biopsies benign. Internal hemorrhoids. Surveillance colonoscopy in 5 years.  . CORONARY STENT INTERVENTION N/A 03/22/2018   Procedure: CORONARY STENT INTERVENTION;  Surgeon: Burnell Blanks, MD;  Location: [facility] CV LAB;  Service: Cardiovascular;  Laterality: N/A;  . ESOPHAGOGASTRODUODENOSCOPY (EGD) WITH PROPOFOL N/A 11/14/2016   Dr. Oneida Alar: Esophagus normal. Moderate gastritis, few gastric polyps. Duodenal biopsies negative. Fundic gland polyp gastric polyp area no H pylori.  Marland Kitchen FOOT SURGERY    .  LEFT HEART CATH AND CORONARY ANGIOGRAPHY N/A 03/22/2018   Procedure: LEFT HEART CATH AND CORONARY ANGIOGRAPHY;  Surgeon: Burnell Blanks, MD;  Location: [place] CV LAB;  Service: Cardiovascular;  Laterality: N/A;  . LEG SURGERY Right   . REPLACEMENT TOTAL KNEE Right   . SPINAL CORD STIMULATOR IMPLANT       Current Meds  Medication Sig  . aspirin EC 81 MG tablet Take 81 mg by mouth daily.  Marland Kitchen  atorvastatin (LIPITOR) 80 MG tablet TAKE ONE TABLET BY MOUTH DAILY AT 6 PM  . carvedilol (COREG) 3.125 MG tablet Take 1 tablet (3.125 mg) 1 hour before cardiac rehab  . Cholecalciferol (VITAMIN D-3) 5000 units TABS Take 5,000 Units by mouth daily.   . Coenzyme Q10 (COQ-10) 100 MG CAPS Take 1 capsule by mouth daily.  Marland Kitchen EPINEPHrine 0.3 mg/0.3 mL IJ SOAJ injection Inject 0.3 mg into the muscle as needed.  . furosemide (LASIX) 20 MG tablet Take 1 tablet (20 mg total) by mouth as needed for up to 30 days.  . Galcanezumab-gnlm (EMGALITY) 120 MG/ML SOAJ Inject 120 mg into the skin every 30 (thirty) days.  . Insulin Human (INSULIN PUMP) SOLN Inject into the skin continuous.   Marland Kitchen levothyroxine (SYNTHROID, LEVOTHROID) 125 MCG tablet Take 125 mcg by mouth daily before breakfast.   . lisinopril (ZESTRIL) 10 MG tablet Take 10 mg by mouth daily.  . metFORMIN (GLUCOPHAGE) 500 MG tablet Take 1 tablet (500 mg total) by mouth 2 (two) times daily with a meal. Do not take 03/23/2018 and 03/24/2018. Restart taking from 03/25/2018. (Patient taking differently: Take 500 mg by mouth 2 (two) times daily with a meal. )  . Multiple Vitamins-Minerals (CENTRUM SILVER 50+WOMEN) TABS Take 1 tablet by mouth daily.   . nitroGLYCERIN (NITROSTAT) 0.4 MG SL tablet Place 1 tablet (0.4 mg total) under the tongue every 5 (five) minutes as needed for chest pain.  Marland Kitchen NOVOLOG 100 UNIT/ML injection Inject into the skin continuous.   . Omega-3 Fatty Acids (FISH OIL) 1000 MG CAPS Take 1,000 mg by mouth daily.  . pantoprazole (PROTONIX) 40 MG tablet Take 1 tablet (40 mg total) by mouth 2 (two) times daily before a meal.  . Polyethylene Glycol 400 (BLINK TEARS) 0.25 % SOLN Apply 1 drop to eye daily as needed (for eye irritation/dryness).  . topiramate (TOPAMAX) 100 MG tablet Take 3 tablets (300 mg total) by mouth at bedtime. 3 TABLETS AT BEDTIME     Allergies:   Fentanyl   Social History   Tobacco Use  . Smoking status: Never Smoker  .  Smokeless tobacco: Never Used  Substance Use Topics  . Alcohol use: No    Alcohol/week: 0.0 standard drinks  . Drug use: No     Family Hx: The patient's family history includes Cancer in her brother, father, sister, sister, and another family member; Cancer (age of onset: 34) in her mother.  ROS:   Please see the history of present illness.     All other systems reviewed and are negative.   Prior CV studies:   The following studies were reviewed today:  Echocardiogram on 07/17/2018 demonstrated normal left ventricular systolic function and regional wall motion, LVEF 60 to 65%, indeterminate grade diastolic dysfunction, moderate LVH, and mild left atrial dilatation.   Cardiac catheterization 03/22/18:   Prox RCA lesion is 20% stenosed.  Prox LAD lesion is 80% stenosed.  A drug-eluting stent was successfully placed using a STENT SIERRA 3.00 X 18 MM.  Post intervention, there is a 0% residual stenosis.  The left ventricular systolic function is normal.  LV end diastolic pressure is normal.  The left ventricular ejection fraction is 55-65% by visual estimate.  There is no mitral valve regurgitation.   1. Severe stenosis proximal LAD 2. Successful PTCA/DES x 1 proximal LAD 3. Mild non-obstructive disease in the RCA\ 4. Normal LV systolic function  Labs/Other Tests and Data Reviewed:    EKG:  No ECG reviewed.  Recent Labs: 12/03/2018: TSH 0.647 02/21/2019: ALT 32; Platelets 218 04/21/2019: BUN 29; Creatinine, Ser 0.99; Hemoglobin 12.6; Potassium 4.5; Sodium 139   Recent Lipid Panel Lab Results  Component Value Date/Time   CHOL 134 04/16/2018 08:15 AM   TRIG 23 04/16/2018 08:15 AM   HDL 75 04/16/2018 08:15 AM   CHOLHDL 1.8 04/16/2018 08:15 AM   LDLCALC 54 04/16/2018 08:15 AM    Wt Readings from Last 3 Encounters:  09/19/19 208 lb (94.3 kg)  07/14/19 219 lb (99.3 kg)  04/24/19 236 lb (107 kg)     Objective:    Vital Signs:  BP 112/60   Ht 5\' 8"  (1.727 m)    Wt 208 lb (94.3 kg)   BMI 31.63 kg/m    VITAL SIGNS:  reviewed  ASSESSMENT & PLAN:    1.  Coronary artery disease: Status post drug-eluting stent placement to the proximal LAD in August 2019.  LV systolic function is normal with an EF of 60 to 65% by echocardiogram in December 2019.  Symptomatically stable.  Continue aspirin, carvedilol, and atorvastatin.  2.  Hypertension: Blood pressure is normal.  No change to therapy.  3.  Hyperlipidemia: Continue atorvastatin.  Goal LDL less than 70.  4.  Obstructive sleep apnea: Continue CPAP.    COVID-19 Education: The signs and symptoms of COVID-19 were discussed with the patient and how to seek care for testing (follow up with PCP or arrange E-visit).  The importance of social distancing was discussed today.  Time:   Today, I have spent 15 minutes with the patient with telehealth technology discussing the above problems.     Medication Adjustments/Labs and Tests Ordered: Current medicines are reviewed at length with the patient today.  Concerns regarding medicines are outlined above.   Tests Ordered: No orders of the defined types were placed in this encounter.   Medication Changes: No orders of the defined types were placed in this encounter.   Follow Up:  Virtual Visit  in 1 year(s)  Signed, Kate Sable, MD  09/19/2019 9:18 AM    [place]

## 2019-09-22 DIAGNOSIS — I10 Essential (primary) hypertension: Secondary | ICD-10-CM | POA: Diagnosis not present

## 2019-09-22 DIAGNOSIS — Z299 Encounter for prophylactic measures, unspecified: Secondary | ICD-10-CM | POA: Diagnosis not present

## 2019-09-22 DIAGNOSIS — J302 Other seasonal allergic rhinitis: Secondary | ICD-10-CM | POA: Diagnosis not present

## 2019-09-22 DIAGNOSIS — Z789 Other specified health status: Secondary | ICD-10-CM | POA: Diagnosis not present

## 2019-09-22 DIAGNOSIS — E039 Hypothyroidism, unspecified: Secondary | ICD-10-CM | POA: Diagnosis not present

## 2019-09-22 DIAGNOSIS — N183 Chronic kidney disease, stage 3 unspecified: Secondary | ICD-10-CM | POA: Diagnosis not present

## 2019-09-22 DIAGNOSIS — J301 Allergic rhinitis due to pollen: Secondary | ICD-10-CM | POA: Diagnosis not present

## 2019-09-24 DIAGNOSIS — J301 Allergic rhinitis due to pollen: Secondary | ICD-10-CM | POA: Diagnosis not present

## 2019-09-26 DIAGNOSIS — J301 Allergic rhinitis due to pollen: Secondary | ICD-10-CM | POA: Diagnosis not present

## 2019-09-29 DIAGNOSIS — J301 Allergic rhinitis due to pollen: Secondary | ICD-10-CM | POA: Diagnosis not present

## 2019-09-30 DIAGNOSIS — M545 Low back pain: Secondary | ICD-10-CM | POA: Diagnosis not present

## 2019-09-30 DIAGNOSIS — Z299 Encounter for prophylactic measures, unspecified: Secondary | ICD-10-CM | POA: Diagnosis not present

## 2019-09-30 DIAGNOSIS — I10 Essential (primary) hypertension: Secondary | ICD-10-CM | POA: Diagnosis not present

## 2019-09-30 DIAGNOSIS — Z6834 Body mass index (BMI) 34.0-34.9, adult: Secondary | ICD-10-CM | POA: Diagnosis not present

## 2019-09-30 DIAGNOSIS — Z713 Dietary counseling and surveillance: Secondary | ICD-10-CM | POA: Diagnosis not present

## 2019-09-30 DIAGNOSIS — T8489XA Other specified complication of internal orthopedic prosthetic devices, implants and grafts, initial encounter: Secondary | ICD-10-CM | POA: Diagnosis not present

## 2019-09-30 DIAGNOSIS — Z978 Presence of other specified devices: Secondary | ICD-10-CM | POA: Diagnosis not present

## 2019-10-01 DIAGNOSIS — J301 Allergic rhinitis due to pollen: Secondary | ICD-10-CM | POA: Diagnosis not present

## 2019-10-03 DIAGNOSIS — J301 Allergic rhinitis due to pollen: Secondary | ICD-10-CM | POA: Diagnosis not present

## 2019-10-08 DIAGNOSIS — J301 Allergic rhinitis due to pollen: Secondary | ICD-10-CM | POA: Diagnosis not present

## 2019-10-10 DIAGNOSIS — J301 Allergic rhinitis due to pollen: Secondary | ICD-10-CM | POA: Diagnosis not present

## 2019-10-13 DIAGNOSIS — J301 Allergic rhinitis due to pollen: Secondary | ICD-10-CM | POA: Diagnosis not present

## 2019-10-15 DIAGNOSIS — J301 Allergic rhinitis due to pollen: Secondary | ICD-10-CM | POA: Diagnosis not present

## 2019-10-17 DIAGNOSIS — G894 Chronic pain syndrome: Secondary | ICD-10-CM | POA: Diagnosis not present

## 2019-10-17 DIAGNOSIS — J301 Allergic rhinitis due to pollen: Secondary | ICD-10-CM | POA: Diagnosis not present

## 2019-10-17 DIAGNOSIS — Z4542 Encounter for adjustment and management of neuropacemaker (brain) (peripheral nerve) (spinal cord): Secondary | ICD-10-CM | POA: Diagnosis not present

## 2019-10-20 ENCOUNTER — Encounter: Payer: Self-pay | Admitting: Adult Health

## 2019-10-20 DIAGNOSIS — J301 Allergic rhinitis due to pollen: Secondary | ICD-10-CM | POA: Diagnosis not present

## 2019-10-22 ENCOUNTER — Ambulatory Visit (INDEPENDENT_AMBULATORY_CARE_PROVIDER_SITE_OTHER): Payer: Medicare Other | Admitting: Adult Health

## 2019-10-22 ENCOUNTER — Encounter: Payer: Self-pay | Admitting: Adult Health

## 2019-10-22 ENCOUNTER — Telehealth: Payer: Self-pay

## 2019-10-22 ENCOUNTER — Other Ambulatory Visit: Payer: Self-pay

## 2019-10-22 VITALS — BP 170/75 | HR 97 | Temp 97.2°F | Ht 68.0 in | Wt 216.0 lb

## 2019-10-22 DIAGNOSIS — E119 Type 2 diabetes mellitus without complications: Secondary | ICD-10-CM | POA: Diagnosis not present

## 2019-10-22 DIAGNOSIS — G4733 Obstructive sleep apnea (adult) (pediatric): Secondary | ICD-10-CM | POA: Diagnosis not present

## 2019-10-22 DIAGNOSIS — M159 Polyosteoarthritis, unspecified: Secondary | ICD-10-CM | POA: Diagnosis not present

## 2019-10-22 DIAGNOSIS — I25118 Atherosclerotic heart disease of native coronary artery with other forms of angina pectoris: Secondary | ICD-10-CM

## 2019-10-22 DIAGNOSIS — Z9989 Dependence on other enabling machines and devices: Secondary | ICD-10-CM

## 2019-10-22 DIAGNOSIS — J301 Allergic rhinitis due to pollen: Secondary | ICD-10-CM | POA: Diagnosis not present

## 2019-10-22 DIAGNOSIS — I10 Essential (primary) hypertension: Secondary | ICD-10-CM | POA: Diagnosis not present

## 2019-10-22 NOTE — Telephone Encounter (Signed)
Sent orders via [employer] per NP request. Awaiting response.

## 2019-10-22 NOTE — Patient Instructions (Signed)
Continue using CPAP nightly and greater than 4 hours each night °If your symptoms worsen or you develop new symptoms please let us know.  ° °

## 2019-10-22 NOTE — Progress Notes (Signed)
Messaged [employer] new cpap machine.  Received.

## 2019-10-22 NOTE — Progress Notes (Signed)
PATIENT: Jodi Ray DOB: 1961-02-10  REASON FOR VISIT: follow up HISTORY FROM: patient  HISTORY OF PRESENT ILLNESS: Today 10/22/19:  Jodi Ray is a 59 year old female with a history of obstructive sleep apnea on CPAP.  Her download indicates that she use her machine nightly for compliance of 100%.  She use her machine greater than 4 hours each night.  On average she uses her machine 8 hours and 17 minutes.  Her residual AHI is 0.5 on 11.6 cm of water with EPR 2.  She reports that her CPAP is working well for her.  She does use a cloth mask and reports that she has a hard time getting this every 3 months from her DME company.  HISTORY (Copied from Dr.Dohmeier's note) 04-24-2019.  this patient returns a sleep study and is now on CPAP with good compliance.  She gets new supplies, a new sleep study was not ordered. The current settings are 11.6 cm water with 2 cm EPR, 100% compliance.  This is been better has used her machine every day of the last 30 days between 25 August on 23 April 2019 average usage time 7 hours and 43 minutes.  Her residual AHI is 0.6/h she does have moderate air leaks, she does have obstructive sleep apnea and rare central events.  Her Epworth Sleepiness Scale has been endorsed at only 2 points at her fatigue severity at 9 points her geriatric depression score was endorsed at 9 out of 15 points.  REVIEW OF SYSTEMS: Out of a complete 14 system review of symptoms, the patient complains only of the following symptoms, and all other reviewed systems are negative.   ALLERGIES: Allergies  Allergen Reactions  . Fentanyl Nausea And Vomiting and Other (See Comments)    Nausea, vomiting, loss of consciousness, requiring reversal    HOME MEDICATIONS: Outpatient Medications Prior to Visit  Medication Sig Dispense Refill  . aspirin EC 81 MG tablet Take 81 mg by mouth daily.    Marland Kitchen atorvastatin (LIPITOR) 80 MG tablet TAKE ONE TABLET BY MOUTH DAILY AT 6 PM 90 tablet 3   . carvedilol (COREG) 3.125 MG tablet Take 1 tablet (3.125 mg) 1 hour before cardiac rehab 90 tablet 3  . Cholecalciferol (VITAMIN D-3) 5000 units TABS Take 5,000 Units by mouth daily.     . Coenzyme Q10 (COQ-10) 100 MG CAPS Take 1 capsule by mouth daily.    Marland Kitchen EPINEPHrine 0.3 mg/0.3 mL IJ SOAJ injection Inject 0.3 mg into the muscle as needed.  1  . Galcanezumab-gnlm (EMGALITY) 120 MG/ML SOAJ Inject 120 mg into the skin every 30 (thirty) days. 1 pen 11  . Insulin Human (INSULIN PUMP) SOLN Inject into the skin continuous.     Marland Kitchen levothyroxine (SYNTHROID, LEVOTHROID) 125 MCG tablet Take 125 mcg by mouth daily before breakfast.   0  . lisinopril (ZESTRIL) 10 MG tablet Take 10 mg by mouth daily.    . metFORMIN (GLUCOPHAGE) 500 MG tablet Take 1 tablet (500 mg total) by mouth 2 (two) times daily with a meal. Do not take 03/23/2018 and 03/24/2018. Restart taking from 03/25/2018. (Patient taking differently: Take 500 mg by mouth 2 (two) times daily with a meal. )    . Multiple Vitamins-Minerals (CENTRUM SILVER 50+WOMEN) TABS Take 1 tablet by mouth daily.     . nitroGLYCERIN (NITROSTAT) 0.4 MG SL tablet Place 1 tablet (0.4 mg total) under the tongue every 5 (five) minutes as needed for chest pain. 25 tablet 3  .  NOVOLOG 100 UNIT/ML injection Inject into the skin continuous.   0  . Omega-3 Fatty Acids (FISH OIL) 1000 MG CAPS Take 1,000 mg by mouth daily.    . OXYGEN Inhale into the lungs. As needed    . Polyethylene Glycol 400 (BLINK TEARS) 0.25 % SOLN Apply 1 drop to eye daily as needed (for eye irritation/dryness).    . topiramate (TOPAMAX) 100 MG tablet Take 3 tablets (300 mg total) by mouth at bedtime. 3 TABLETS AT BEDTIME 90 tablet 0  . furosemide (LASIX) 20 MG tablet Take 1 tablet (20 mg total) by mouth as needed for up to 30 days. 30 tablet 3  . pantoprazole (PROTONIX) 40 MG tablet Take 1 tablet (40 mg total) by mouth 2 (two) times daily before a meal. 60 tablet 0   No facility-administered medications  prior to visit.    PAST MEDICAL HISTORY: Past Medical History:  Diagnosis Date  . 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 Anemia   . Brain aneurysm    frontal lobe  . CAD (coronary artery disease)    a. 02/2018: s/p DES to Proximal LAD with residual 20% RCA stenosis.  . Chronic pain   . Diabetes mellitus without complication ([place])   . Headache   . History of left bundle branch block (LBBB)   . HTN (hypertension)   . Hx of cardiovascular stress test 10/2016   intermediate risk study  . Hypothyroidism   . Neuromuscular disorder ([place])   . Neuropathy   . Obesity   . OSA on CPAP 03/10/2015  . Retinopathy   . Sleep apnea   . Varicose veins of both lower extremities     PAST SURGICAL HISTORY: Past Surgical History:  Procedure Laterality Date  . BIOPSY  11/14/2016   Procedure: BIOPSY;  Surgeon: Danie Binder, MD;  [facility];  Service: Endoscopy;;  colon gastric duodenal  . BREAST SURGERY Left 1994   Lumpectomy  . COLONOSCOPY WITH PROPOFOL N/A 11/14/2016   Dr. Oneida Alar: Moderately redundant rectosigmoid colon. Random colon biopsies benign. Internal hemorrhoids. Surveillance colonoscopy in 5 years.  . CORONARY STENT INTERVENTION N/A 03/22/2018   Procedure: CORONARY STENT INTERVENTION;  Surgeon: Burnell Blanks, MD;  Location: [facility];  Service: Cardiovascular;  Laterality: N/A;  . ESOPHAGOGASTRODUODENOSCOPY (EGD) WITH PROPOFOL N/A 11/14/2016   Dr. Oneida Alar: Esophagus normal. Moderate gastritis, few gastric polyps. Duodenal biopsies negative. Fundic gland polyp gastric polyp area no H pylori.  Marland Kitchen FOOT SURGERY    . LEFT HEART CATH AND CORONARY ANGIOGRAPHY N/A 03/22/2018   Procedure: LEFT HEART CATH AND CORONARY ANGIOGRAPHY;  Surgeon: Burnell Blanks, MD;  Location: [facility];  Service: Cardiovascular;  Laterality: N/A;  . LEG SURGERY Right   . REPLACEMENT TOTAL KNEE Right   . SPINAL CORD STIMULATOR IMPLANT      FAMILY HISTORY: Family History  Problem  Relation Age of Onset  . Cancer Mother 40       colon  . Cancer Father        renal cell  . Cancer Brother        bladder  . Cancer Sister        primary brain, and primary breast too  . Cancer Sister        breast   . Cancer Other        multiple aunts with breast, cervical, or colon cancer    SOCIAL HISTORY: Social History   Socioeconomic History  .  Marital status: Widowed    Spouse name: Not on file  . Number of children: 0  . Years of education: Not on file  . Highest education level: Bachelor's degree (e.g., BA, AB, BS)  Occupational History  . Occupation: Retired  Tobacco Use  . Smoking status: Never Smoker  . Smokeless tobacco: Never Used  Substance and Sexual Activity  . Alcohol use: No    Alcohol/week: 0.0 standard drinks  . Drug use: No  . Sexual activity: Yes    Partners: Male  Other Topics Concern  . Not on file  Social History Narrative   Drinks about 1 cup coffee day.   Cardiac rehab 3x week   One level home with significant other   Bachelors in Accounting      Right handed   No children   widow   Social Determinants of Health   Financial Resource Strain:   . Difficulty of Paying Living Expenses:   Food Insecurity:   . Worried About Charity fundraiser in the Last Year:   . Arboriculturist in the Last Year:   Transportation Needs:   . Film/video editor (Medical):   Marland Kitchen Lack of Transportation (Non-Medical):   Physical Activity:   . Days of Exercise per Week:   . Minutes of Exercise per Session:   Stress:   . Feeling of Stress :   Social Connections:   . Frequency of Communication with Friends and Family:   . Frequency of Social Gatherings with Friends and Family:   . Attends Religious Services:   . Active Member of Clubs or Organizations:   . Attends Archivist Meetings:   Marland Kitchen Marital Status:   Intimate Partner Violence:   . Fear of Current or Ex-Partner:   . Emotionally Abused:   Marland Kitchen Physically Abused:   . Sexually  Abused:       PHYSICAL EXAM  Vitals:   10/22/19 0819  BP: (!) 170/75  Pulse: 97  Temp: (!) 97.2 F (36.2 C)  Weight: 216 lb (98 kg)  Height: 5\' 8"  (1.727 m)   Body mass index is 32.84 kg/m.  Generalized: Well developed, in no acute distress  Chest: Lungs clear to auscultation bilaterally  Neurological examination  Mentation: Alert oriented to time, place, history taking. Follows all commands speech and language fluent Cranial nerve II-XII: Extraocular movements were full, visual field were full on confrontational test Head turning and shoulder shrug  were normal and symmetric. Motor: The motor testing reveals 5 over 5 strength of all 4 extremities. Good symmetric motor tone is noted throughout.  Sensory: Sensory testing is intact to soft touch on all 4 extremities. No evidence of extinction is noted.  Gait and station: Gait is normal.    DIAGNOSTIC DATA (LABS, IMAGING, TESTING) - I reviewed patient records, labs, notes, testing and imaging myself where available.  Lab Results  Component Value Date   WBC 5.8 02/21/2019   HGB 12.6 04/21/2019   HCT 40.4 04/21/2019   MCV 95.3 02/21/2019   PLT 218 02/21/2019      Component Value Date/Time   NA 139 04/21/2019 0851   K 4.5 04/21/2019 0851   CL 107 04/21/2019 0851   CO2 24 04/21/2019 0851   GLUCOSE 186 (H) 04/21/2019 0851   BUN 29 (H) 04/21/2019 0851   CREATININE 0.99 04/21/2019 0851   CREATININE 0.98 07/23/2015 0707   CALCIUM 9.7 04/21/2019 0852   CALCIUM 9.5 04/21/2019 0851   PROT 7.3  02/21/2019 1534   ALBUMIN 4.2 04/21/2019 0851   AST 20 02/21/2019 1534   ALT 32 02/21/2019 1534   ALKPHOS 76 02/21/2019 1534   BILITOT 0.6 02/21/2019 1534   GFRNONAA >60 04/21/2019 0851   GFRAA >60 04/21/2019 0851   Lab Results  Component Value Date   CHOL 134 04/16/2018   HDL 75 04/16/2018   LDLCALC 54 04/16/2018   TRIG 23 04/16/2018   CHOLHDL 1.8 04/16/2018   Lab Results  Component Value Date   HGBA1C 7.8 (H)  03/20/2018   No results found for: VITAMINB12 Lab Results  Component Value Date   TSH 0.647 12/03/2018      ASSESSMENT AND PLAN 59 y.o. 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 here with:  1. OSA on CPAP  - CPAP compliance excellent - Good treatment of AHI  - Encourage patient to use CPAP nightly and > 4 hours each night -Order placed for new CPAP machine - F/U in 1 year or sooner if needed   I spent 25 minutes of face-to-face and non-face-to-face time with patient.  This included previsit chart review, lab review, study review, order entry, electronic health record documentation, patient education.  Ward Givens, MSN, NP-C 10/22/2019, 8:29 AM [facility] [address], [place], [place] [address] [phone]

## 2019-10-23 NOTE — Telephone Encounter (Signed)
Received from [facility]. Anda Latina, RN; Eduardo Osier D Printed.

## 2019-10-24 DIAGNOSIS — J301 Allergic rhinitis due to pollen: Secondary | ICD-10-CM | POA: Diagnosis not present

## 2019-10-27 DIAGNOSIS — J301 Allergic rhinitis due to pollen: Secondary | ICD-10-CM | POA: Diagnosis not present

## 2019-10-28 DIAGNOSIS — E1151 Type 2 diabetes mellitus with diabetic peripheral angiopathy without gangrene: Secondary | ICD-10-CM | POA: Diagnosis not present

## 2019-10-28 DIAGNOSIS — E1140 Type 2 diabetes mellitus with diabetic neuropathy, unspecified: Secondary | ICD-10-CM | POA: Diagnosis not present

## 2019-10-29 DIAGNOSIS — J301 Allergic rhinitis due to pollen: Secondary | ICD-10-CM | POA: Diagnosis not present

## 2019-11-05 DIAGNOSIS — J301 Allergic rhinitis due to pollen: Secondary | ICD-10-CM | POA: Diagnosis not present

## 2019-11-07 DIAGNOSIS — J301 Allergic rhinitis due to pollen: Secondary | ICD-10-CM | POA: Diagnosis not present

## 2019-11-10 DIAGNOSIS — J301 Allergic rhinitis due to pollen: Secondary | ICD-10-CM | POA: Diagnosis not present

## 2019-11-11 NOTE — Progress Notes (Signed)
NEUROLOGY FOLLOW UP OFFICE NOTE  Jodi Ray [id number]  HISTORY OF PRESENT ILLNESS: Jodi Ray is a28year oldhanded woman with chronic pain related to complex regional pain syndrome (with spinal stimulator), hypertension, type 1 diabetes mellitus with polyneuropathy, OSA on CPAP, hypothyroidism, polymyalgia rheumatica who follows up for cluster headache.  UPDATE: Topiramate titrated to 300mg  at bedtime in December.  Prescribed home 100% O2 for abortive therapy for cluster headaches.  Intensity:  severe Duration:  15 minutes with O2 Frequency:  Headaches seem to occur with change in barometric pressure (such as right before a storm).   She can't quantify the exact amount of headaches over the past 30 days. Also, now she reports dizziness with her cluster headaches.  She has fallen twice due to the dizziness. She doesn't think the 300mg  daily of topiramate is helping.    Ice pick headaches less frequent and intense.  They are manageable.    Current NSAIDs: ASA 81mg  daily. Current analgesics: Lidocaine nasal spray Current antihypertensive:  Lisinopril, Lasix, Coreg Current anti-epileptic:topiramate300mg  at bedtime,gabapentin1200mg at bedtime Current anti-CGRP: Emgality Supplements: Melatonin 20mg ; CoQ10 100mg ; D3 Other therapy:  100% O2  Other medications:  Synthroid  HISTORY: IPrimary Stabbing Headache: Onset:2011.She was previously treated in [place], where she was diagnosed with primary stabbing headache. Location:Right frontal region Quality:stabbing Intensity:10/10 Aura:no Prodrome:no Associated symptoms:Some nausea if severe. No autonomic symptoms. Duration:10 to 60 minutes Frequency:4 times a week Triggers/exacerbating factors:change in weather. Relieving factors:none Activity:Cannot function when experiencing it.  II Cluster headache:She began havingnew intractableheadaches in late Decemeber2019.Her  right eye gets bloodshot and right nare runs. Right side of face gets red. They stabbing headache starts above the right eye and radiates, constant for a month, fluctuating in intensity. She went to [facility] ED on 08/11/18. CT of head and CTA of head and neck were performed and personally reviewed, showing no acute abnormality or aneurysm.Sed rate from 08/22/18 was 24.Past management: Prednisone taper, headache cocktail  IIIMigraines:They are bi-frontal/maxillary and associated with slight nausea.She has had this before when her allergies "act up."  IVDiabetic neuropathy: Secondary to Type 1 diabetes.  VComplex regional pain syndrome: following an accident where she fell down the steps and crushed her right leg.She takes gabapentin, tramadol and has a spinal nerve stimulator.She takes this for her painful diabetic neuropathy as well.  VIRight arm pain and numbness: Since 2015, she has had episodes of right arm pain and numbness. It only occurs when she is laying in bed, and occurs no matter what position. Her entire arm goes "dead". It is both numb, painful and unable to move it. There is no shooting pain down the arm from the neck, however she gets occasional shooting pain from the right side of her neck into the shoulder. She has to use her other arm to shake it out and it resolves in a minute or two. It occurs every night. She was sent to pain management for possible cervical radiculopathy or thoracic outlet syndrome. She did receive injections, which helped. She had an MRI of the cervical and thoracic spine on 06/12/14. Imaging not available, but report mentions diffuse facet arthropathy and degenerative changes in the cervical spine, but no nerve root impingement or cord compression. There was no evidence of thoracic outlet syndrome. She was sent to [facility] for further evaluation. 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 They told her  that her symptoms were related to her diabetic neuropathy.  VIICerebral aneurysm: In 2009, she had a CTA of the head which reportedly showed a 1-48mm an eurysm at the junction of right A1 and A2 segment of the ACA. However, a repeat CTA performed on 02/22/12 did not reveal any aneurysm.  She cannot have an MRI due to spinal stimulator.  Past medictions: NSAIDs:  Indomethacin 25mg  three times daily (effective but discontinued due to elevated liver enzymes) Past analgesics:  Lidocaine nasal Past anthypertensive medication:  Verapamil (ineffective) Past antiepileptics:  Trokendi XR (effective but expensive)  PAST MEDICAL HISTORY: Past Medical History:  Diagnosis Date  . 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 Anemia   . Brain aneurysm    frontal lobe  . CAD (coronary artery disease)    a. 02/2018: s/p DES to Proximal LAD with residual 20% RCA stenosis.  . Chronic pain   . Diabetes mellitus without complication ([place])   . Headache   . History of left bundle branch block (LBBB)   . HTN (hypertension)   . Hx of cardiovascular stress test 10/2016   intermediate risk study  . Hypothyroidism   . Neuromuscular disorder ([place])   . Neuropathy   . Obesity   . OSA on CPAP 03/10/2015  . Retinopathy   . Sleep apnea   . Varicose veins of both lower extremities     MEDICATIONS: Current Outpatient Medications on File Prior to Visit  Medication Sig Dispense Refill  . aspirin EC 81 MG tablet Take 81 mg by mouth daily.    Marland Kitchen atorvastatin (LIPITOR) 80 MG tablet TAKE ONE TABLET BY MOUTH DAILY AT 6 PM 90 tablet 3  . carvedilol (COREG) 3.125 MG tablet Take 1 tablet (3.125 mg) 1 hour before cardiac rehab 90 tablet 3  . Cholecalciferol (VITAMIN D-3) 5000 units TABS Take 5,000 Units by mouth daily.     . Coenzyme Q10 (COQ-10) 100 MG CAPS Take 1 capsule by mouth  daily.    Marland Kitchen EPINEPHrine 0.3 mg/0.3 mL IJ SOAJ injection Inject 0.3 mg into the muscle as needed.  1  . furosemide (LASIX) 20 MG tablet Take 1 tablet (20 mg total) by mouth as needed for up to 30 days. 30 tablet 3  . Galcanezumab-gnlm (EMGALITY) 120 MG/ML SOAJ Inject 120 mg into the skin every 30 (thirty) days. 1 pen 11  . Insulin Human (INSULIN PUMP) SOLN Inject into the skin continuous.     Marland Kitchen levothyroxine (SYNTHROID, LEVOTHROID) 125 MCG tablet Take 125 mcg by mouth daily before breakfast.   0  . lisinopril (ZESTRIL) 10 MG tablet Take 10 mg by mouth daily.    . metFORMIN (GLUCOPHAGE) 500 MG tablet Take 1 tablet (500 mg total) by mouth 2 (two) times daily with a meal. Do not take 03/23/2018 and 03/24/2018. Restart taking from 03/25/2018. (Patient taking differently: Take 500 mg by mouth 2 (two) times daily with a meal. )    . Multiple Vitamins-Minerals (CENTRUM SILVER 50+WOMEN) TABS Take 1 tablet by mouth daily.     . nitroGLYCERIN (NITROSTAT) 0.4 MG SL tablet Place 1 tablet (0.4 mg total) under the tongue every 5 (five) minutes as needed for chest pain. 25 tablet 3  . NOVOLOG 100 UNIT/ML injection Inject into the skin continuous.   0  . Omega-3 Fatty Acids (FISH OIL) 1000 MG CAPS Take 1,000 mg by mouth daily.    . OXYGEN Inhale into the lungs. As needed    . pantoprazole (PROTONIX) 40 MG tablet  Take 1 tablet (40 mg total) by mouth 2 (two) times daily before a meal. 60 tablet 0  . Polyethylene Glycol 400 (BLINK TEARS) 0.25 % SOLN Apply 1 drop to eye daily as needed (for eye irritation/dryness).    . topiramate (TOPAMAX) 100 MG tablet Take 3 tablets (300 mg total) by mouth at bedtime. 3 TABLETS AT BEDTIME 90 tablet 0   No current facility-administered medications on file prior to visit.    ALLERGIES: Allergies  Allergen Reactions  . Fentanyl Nausea And Vomiting and Other (See Comments)    Nausea, vomiting, loss of consciousness, requiring reversal    FAMILY HISTORY: Family History  Problem  Relation Age of Onset  . Cancer Mother 32       colon  . Cancer Father        renal cell  . Cancer Brother        bladder  . Cancer Sister        primary brain, and primary breast too  . Cancer Sister        breast   . Cancer Other        multiple aunts with breast, cervical, or colon cancer     SOCIAL HISTORY: Social History   Socioeconomic History  . Marital status: Widowed    Spouse name: Not on file  . Number of children: 0  . Years of education: Not on file  . Highest education level: Bachelor's degree (e.g., BA, AB, BS)  Occupational History  . Occupation: Retired  Tobacco Use  . Smoking status: Never Smoker  . Smokeless tobacco: Never Used  Substance and Sexual Activity  . Alcohol use: No    Alcohol/week: 0.0 standard drinks  . Drug use: No  . Sexual activity: Yes    Partners: Male  Other Topics Concern  . Not on file  Social History Narrative   Drinks about 1 cup coffee day.   Cardiac rehab 3x week   One level home with significant other   Bachelors in Accounting      Right handed   No children   widow   Social Determinants of Health   Financial Resource Strain:   . Difficulty of Paying Living Expenses:   Food Insecurity:   . Worried About Charity fundraiser in the Last Year:   . Arboriculturist in the Last Year:   Transportation Needs:   . Film/video editor (Medical):   Marland Kitchen Lack of Transportation (Non-Medical):   Physical Activity:   . Days of Exercise per Week:   . Minutes of Exercise per Session:   Stress:   . Feeling of Stress :   Social Connections:   . Frequency of Communication with Friends and Family:   . Frequency of Social Gatherings with Friends and Family:   . Attends Religious Services:   . Active Member of Clubs or Organizations:   . Attends Archivist Meetings:   Marland Kitchen Marital Status:   Intimate Partner Violence:   . Fear of Current or Ex-Partner:   . Emotionally Abused:   Marland Kitchen Physically Abused:   . Sexually  Abused:     PHYSICAL EXAM: Blood pressure (!) 166/75, pulse 91, height 5\' 8"  (1.727 m), weight 218 lb (98.9 kg), SpO2 98 %. General: No acute distress.  Patient appears well-groomed.   Head:  Normocephalic/atraumatic Eyes:  Fundi examined but not visualized Neck: supple, no paraspinal tenderness, full range of motion Heart:  Regular rate and rhythm Lungs:  Clear to auscultation bilaterally Back: No paraspinal tenderness Neurological Exam: alert and oriented to person, place, and time. Attention span and concentration intact, recent and remote memory intact, fund of knowledge intact.  Speech fluent and not dysarthric, language intact.  CN II-XII intact. Bulk and tone normal, muscle strength 5/5 throughout.  Sensation to light touch, temperature and vibration intact.  Deep tendon reflexes 2+ throughout, toes downgoing.  Finger to nose and heel to shin testing intact.  Gait normal, Romberg negative.  IMPRESSION: 1.  Episodic cluster headache 2.  Primary stabbing headache 3.  Hypergension  PLAN: 1.  For preventative management: Emgality monthly.  Since ineffective, will taper topiramate down to 100mg  at bedtime.  If needed, consider switching topiramate to Depakote in the future. 2.  For abortive therapy, 100% O2 15 L/min for 15-20 minutes 3.  Limit use of pain relievers to no more than 2 days out of week to prevent risk of rebound or medication-overuse headache. 4.  Keep headache diary 5.  Exercise, hydration, caffeine cessation, sleep hygiene, monitor for and avoid triggers 6. Follow up BP with PCP 7. Follow up 4 months   Metta Clines, DO  CC: Monico Blitz, MD

## 2019-11-12 DIAGNOSIS — J301 Allergic rhinitis due to pollen: Secondary | ICD-10-CM | POA: Diagnosis not present

## 2019-11-13 ENCOUNTER — Other Ambulatory Visit: Payer: Self-pay

## 2019-11-13 ENCOUNTER — Encounter: Payer: Self-pay | Admitting: Neurology

## 2019-11-13 ENCOUNTER — Ambulatory Visit (INDEPENDENT_AMBULATORY_CARE_PROVIDER_SITE_OTHER): Payer: Medicare Other | Admitting: Neurology

## 2019-11-13 VITALS — BP 166/75 | HR 91 | Ht 68.0 in | Wt 218.0 lb

## 2019-11-13 DIAGNOSIS — G44019 Episodic cluster headache, not intractable: Secondary | ICD-10-CM | POA: Diagnosis not present

## 2019-11-13 DIAGNOSIS — G4485 Primary stabbing headache: Secondary | ICD-10-CM | POA: Diagnosis not present

## 2019-11-13 DIAGNOSIS — I25118 Atherosclerotic heart disease of native coronary artery with other forms of angina pectoris: Secondary | ICD-10-CM

## 2019-11-13 DIAGNOSIS — I10 Essential (primary) hypertension: Secondary | ICD-10-CM

## 2019-11-13 DIAGNOSIS — G44001 Cluster headache syndrome, unspecified, intractable: Secondary | ICD-10-CM | POA: Diagnosis not present

## 2019-11-13 MED ORDER — EMGALITY 120 MG/ML ~~LOC~~ SOAJ: 120.0000 mg | SUBCUTANEOUS | 11 refills | Status: DC

## 2019-11-13 NOTE — Patient Instructions (Signed)
1.  We will reduce dose of topiramate back to 100mg  at beditme;  Take one 200mg  tablet and 1/2 100mg  tablet at bedtime for one week  Then one 200mg  tablet at bedtime for one week  Then one 100mg  tablet at bedtime (stop the 200mg  tablet) 2.  Continue Emgality every month 3.  Use the oxygen as needed 4.  Follow up in 4 months.

## 2019-11-14 DIAGNOSIS — J301 Allergic rhinitis due to pollen: Secondary | ICD-10-CM | POA: Diagnosis not present

## 2019-11-15 ENCOUNTER — Emergency Department (HOSPITAL_COMMUNITY): Payer: Medicare Other

## 2019-11-15 ENCOUNTER — Other Ambulatory Visit: Payer: Self-pay

## 2019-11-15 ENCOUNTER — Encounter (HOSPITAL_COMMUNITY): Payer: Self-pay

## 2019-11-15 ENCOUNTER — Inpatient Hospital Stay (HOSPITAL_COMMUNITY)
Admission: EM | Admit: 2019-11-15 | Discharge: 2019-11-17 | DRG: 919 | Disposition: A | Discharge status: Home / Self Care | Payer: Medicare Other | Attending: Internal Medicine | Admitting: Internal Medicine

## 2019-11-15 DIAGNOSIS — J301 Allergic rhinitis due to pollen: Secondary | ICD-10-CM | POA: Diagnosis not present

## 2019-11-15 DIAGNOSIS — I10 Essential (primary) hypertension: Secondary | ICD-10-CM

## 2019-11-15 DIAGNOSIS — K2970 Gastritis, unspecified, without bleeding: Secondary | ICD-10-CM | POA: Diagnosis present

## 2019-11-15 DIAGNOSIS — E860 Dehydration: Secondary | ICD-10-CM | POA: Diagnosis present

## 2019-11-15 DIAGNOSIS — R079 Chest pain, unspecified: Secondary | ICD-10-CM | POA: Diagnosis not present

## 2019-11-15 DIAGNOSIS — Z20822 Contact with and (suspected) exposure to covid-19: Secondary | ICD-10-CM | POA: Diagnosis present

## 2019-11-15 DIAGNOSIS — E1010 Type 1 diabetes mellitus with ketoacidosis without coma: Principal | ICD-10-CM

## 2019-11-15 DIAGNOSIS — E66811 Obesity, class 1: Secondary | ICD-10-CM | POA: Diagnosis present

## 2019-11-15 DIAGNOSIS — K219 Gastro-esophageal reflux disease without esophagitis: Secondary | ICD-10-CM | POA: Diagnosis present

## 2019-11-15 DIAGNOSIS — E1040 Type 1 diabetes mellitus with diabetic neuropathy, unspecified: Secondary | ICD-10-CM | POA: Diagnosis present

## 2019-11-15 DIAGNOSIS — N179 Acute kidney failure, unspecified: Secondary | ICD-10-CM

## 2019-11-15 DIAGNOSIS — Y742 Prosthetic and other implants, materials and accessory general hospital and personal-use devices associated with adverse incidents: Secondary | ICD-10-CM | POA: Diagnosis present

## 2019-11-15 DIAGNOSIS — G43909 Migraine, unspecified, not intractable, without status migrainosus: Secondary | ICD-10-CM | POA: Diagnosis present

## 2019-11-15 DIAGNOSIS — T85694A Other mechanical complication of insulin pump, initial encounter: Principal | ICD-10-CM | POA: Diagnosis present

## 2019-11-15 DIAGNOSIS — Z7982 Long term (current) use of aspirin: Secondary | ICD-10-CM | POA: Diagnosis not present

## 2019-11-15 DIAGNOSIS — K2990 Gastroduodenitis, unspecified, without bleeding: Secondary | ICD-10-CM | POA: Diagnosis present

## 2019-11-15 DIAGNOSIS — E669 Obesity, unspecified: Secondary | ICD-10-CM | POA: Diagnosis present

## 2019-11-15 DIAGNOSIS — R0789 Other chest pain: Secondary | ICD-10-CM | POA: Diagnosis not present

## 2019-11-15 DIAGNOSIS — E1022 Type 1 diabetes mellitus with diabetic chronic kidney disease: Secondary | ICD-10-CM | POA: Diagnosis present

## 2019-11-15 DIAGNOSIS — I447 Left bundle-branch block, unspecified: Secondary | ICD-10-CM | POA: Diagnosis not present

## 2019-11-15 DIAGNOSIS — I130 Hypertensive heart and chronic kidney disease with heart failure and stage 1 through stage 4 chronic kidney disease, or unspecified chronic kidney disease: Secondary | ICD-10-CM | POA: Diagnosis present

## 2019-11-15 DIAGNOSIS — I5032 Chronic diastolic (congestive) heart failure: Secondary | ICD-10-CM | POA: Diagnosis present

## 2019-11-15 DIAGNOSIS — Z6832 Body mass index (BMI) 32.0-32.9, adult: Secondary | ICD-10-CM

## 2019-11-15 DIAGNOSIS — I252 Old myocardial infarction: Secondary | ICD-10-CM

## 2019-11-15 DIAGNOSIS — I499 Cardiac arrhythmia, unspecified: Secondary | ICD-10-CM | POA: Diagnosis not present

## 2019-11-15 DIAGNOSIS — Z79899 Other long term (current) drug therapy: Secondary | ICD-10-CM

## 2019-11-15 DIAGNOSIS — N1832 Chronic kidney disease, stage 3b: Secondary | ICD-10-CM | POA: Diagnosis present

## 2019-11-15 DIAGNOSIS — E1165 Type 2 diabetes mellitus with hyperglycemia: Secondary | ICD-10-CM | POA: Diagnosis not present

## 2019-11-15 DIAGNOSIS — E039 Hypothyroidism, unspecified: Secondary | ICD-10-CM | POA: Diagnosis present

## 2019-11-15 DIAGNOSIS — I2510 Atherosclerotic heart disease of native coronary artery without angina pectoris: Secondary | ICD-10-CM | POA: Diagnosis present

## 2019-11-15 DIAGNOSIS — Z9989 Dependence on other enabling machines and devices: Secondary | ICD-10-CM

## 2019-11-15 DIAGNOSIS — N1831 Chronic kidney disease, stage 3a: Secondary | ICD-10-CM | POA: Diagnosis not present

## 2019-11-15 DIAGNOSIS — E782 Mixed hyperlipidemia: Secondary | ICD-10-CM

## 2019-11-15 DIAGNOSIS — Z794 Long term (current) use of insulin: Secondary | ICD-10-CM

## 2019-11-15 DIAGNOSIS — Z9641 Presence of insulin pump (external) (internal): Secondary | ICD-10-CM | POA: Diagnosis present

## 2019-11-15 DIAGNOSIS — N183 Chronic kidney disease, stage 3 unspecified: Secondary | ICD-10-CM | POA: Diagnosis present

## 2019-11-15 DIAGNOSIS — E10319 Type 1 diabetes mellitus with unspecified diabetic retinopathy without macular edema: Secondary | ICD-10-CM | POA: Diagnosis present

## 2019-11-15 DIAGNOSIS — G4733 Obstructive sleep apnea (adult) (pediatric): Secondary | ICD-10-CM | POA: Diagnosis present

## 2019-11-15 DIAGNOSIS — Z955 Presence of coronary angioplasty implant and graft: Secondary | ICD-10-CM

## 2019-11-15 DIAGNOSIS — R072 Precordial pain: Secondary | ICD-10-CM | POA: Diagnosis not present

## 2019-11-15 DIAGNOSIS — Z79890 Hormone replacement therapy (postmenopausal): Secondary | ICD-10-CM

## 2019-11-15 DIAGNOSIS — E875 Hyperkalemia: Secondary | ICD-10-CM | POA: Diagnosis present

## 2019-11-15 DIAGNOSIS — Z885 Allergy status to narcotic agent status: Secondary | ICD-10-CM

## 2019-11-15 LAB — BASIC METABOLIC PANEL
Anion gap: 12 (ref 5–15)
Anion gap: 10 (ref 5–15)
Anion gap: 9 (ref 5–15)
BUN: 46 mg/dL — ABNORMAL HIGH (ref 8–23)
BUN: 47 mg/dL — ABNORMAL HIGH (ref 8–23)
BUN: 49 mg/dL — ABNORMAL HIGH (ref 8–23)
CO2: 15 mmol/L — ABNORMAL LOW (ref 22–32)
CO2: 18 mmol/L — ABNORMAL LOW (ref 22–32)
CO2: 19 mmol/L — ABNORMAL LOW (ref 22–32)
Calcium: 8.6 mg/dL — ABNORMAL LOW (ref 8.9–10.3)
Calcium: 8.9 mg/dL (ref 8.9–10.3)
Calcium: 9 mg/dL (ref 8.9–10.3)
Chloride: 109 mmol/L (ref 98–111)
Chloride: 110 mmol/L (ref 98–111)
Chloride: 112 mmol/L — ABNORMAL HIGH (ref 98–111)
Creatinine, Ser: 1.65 mg/dL — ABNORMAL HIGH (ref 0.44–1.00)
Creatinine, Ser: 1.47 mg/dL — ABNORMAL HIGH (ref 0.44–1.00)
Creatinine, Ser: 1.45 mg/dL — ABNORMAL HIGH (ref 0.44–1.00)
GFR calc Af Amer: 38 mL/min — ABNORMAL LOW (ref >60)
GFR calc Af Amer: 44 mL/min — ABNORMAL LOW (ref >60)
GFR calc Af Amer: 45 mL/min — ABNORMAL LOW (ref >60)
GFR calc non Af Amer: 33 mL/min — ABNORMAL LOW (ref >60)
GFR calc non Af Amer: 38 mL/min — ABNORMAL LOW (ref >60)
GFR calc non Af Amer: 39 mL/min — ABNORMAL LOW (ref >60)
Glucose, Bld: 380 mg/dL — ABNORMAL HIGH (ref 70–99)
Glucose, Bld: 201 mg/dL — ABNORMAL HIGH (ref 70–99)
Glucose, Bld: 142 mg/dL — ABNORMAL HIGH (ref 70–99)
Potassium: 4.8 mmol/L (ref 3.5–5.1)
Potassium: 4.6 mmol/L (ref 3.5–5.1)
Potassium: 4.8 mmol/L (ref 3.5–5.1)
Sodium: 136 mmol/L (ref 135–145)
Sodium: 138 mmol/L (ref 135–145)
Sodium: 140 mmol/L (ref 135–145)

## 2019-11-15 LAB — COMPREHENSIVE METABOLIC PANEL
ALT: 32 U/L (ref 0–44)
AST: 24 U/L (ref 15–41)
Albumin: 3.9 g/dL (ref 3.5–5.0)
Alkaline Phosphatase: 63 U/L (ref 38–126)
Anion gap: 18 — ABNORMAL HIGH (ref 5–15)
BUN: 48 mg/dL — ABNORMAL HIGH (ref 8–23)
CO2: 14 mmol/L — ABNORMAL LOW (ref 22–32)
Calcium: 9.1 mg/dL (ref 8.9–10.3)
Chloride: 103 mmol/L (ref 98–111)
Creatinine, Ser: 1.76 mg/dL — ABNORMAL HIGH (ref 0.44–1.00)
GFR calc Af Amer: 36 mL/min — ABNORMAL LOW (ref >60)
GFR calc non Af Amer: 31 mL/min — ABNORMAL LOW (ref >60)
Glucose, Bld: 642 mg/dL (ref 70–99)
Potassium: 6.3 mmol/L (ref 3.5–5.1)
Sodium: 135 mmol/L (ref 135–145)
Total Bilirubin: 1.1 mg/dL (ref 0.3–1.2)
Total Protein: 6.8 g/dL (ref 6.5–8.1)

## 2019-11-15 LAB — URINALYSIS, ROUTINE W REFLEX MICROSCOPIC
Bilirubin Urine: NEGATIVE
Glucose, UA: >=500 mg/dL — AB
Hgb urine dipstick: NEGATIVE
Ketones, ur: 20 mg/dL — AB
Leukocytes,Ua: NEGATIVE
Nitrite: NEGATIVE
Protein, ur: NEGATIVE mg/dL
Specific Gravity, Urine: 1.022 (ref 1.005–1.030)
pH: 5 (ref 5.0–8.0)

## 2019-11-15 LAB — CBG MONITORING, ED
Glucose-Capillary: 559 mg/dL (ref 70–99)
Glucose-Capillary: 529 mg/dL (ref 70–99)
Glucose-Capillary: 347 mg/dL — ABNORMAL HIGH (ref 70–99)
Glucose-Capillary: 280 mg/dL — ABNORMAL HIGH (ref 70–99)
Glucose-Capillary: 216 mg/dL — ABNORMAL HIGH (ref 70–99)
Glucose-Capillary: 197 mg/dL — ABNORMAL HIGH (ref 70–99)

## 2019-11-15 LAB — CBC WITH DIFFERENTIAL/PLATELET
Abs Immature Granulocytes: 0.04 10*3/uL (ref 0.00–0.07)
Basophils Absolute: 0 10*3/uL (ref 0.0–0.1)
Basophils Relative: 0 %
Eosinophils Absolute: 0 10*3/uL (ref 0.0–0.5)
Eosinophils Relative: 0 %
HCT: 35.2 % — ABNORMAL LOW (ref 36.0–46.0)
Hemoglobin: 10.6 g/dL — ABNORMAL LOW (ref 12.0–15.0)
Immature Granulocytes: 0 %
Lymphocytes Relative: 12 %
Lymphs Abs: 1.2 10*3/uL (ref 0.7–4.0)
MCH: 30.8 pg (ref 26.0–34.0)
MCHC: 30.1 g/dL (ref 30.0–36.0)
MCV: 102.3 fL — ABNORMAL HIGH (ref 80.0–100.0)
Monocytes Absolute: 0.3 10*3/uL (ref 0.1–1.0)
Monocytes Relative: 3 %
Neutro Abs: 8.7 10*3/uL — ABNORMAL HIGH (ref 1.7–7.7)
Neutrophils Relative %: 85 %
Platelets: 167 10*3/uL (ref 150–400)
RBC: 3.44 MIL/uL — ABNORMAL LOW (ref 3.87–5.11)
RDW: 13.8 % (ref 11.5–15.5)
WBC: 10.2 10*3/uL (ref 4.0–10.5)
nRBC: 0 % (ref 0.0–0.2)

## 2019-11-15 LAB — BLOOD GAS, VENOUS
Acid-base deficit: 12.4 mmol/L — ABNORMAL HIGH (ref 0.0–2.0)
Bicarbonate: 13.2 mmol/L — ABNORMAL LOW (ref 20.0–28.0)
FIO2: 21
O2 Saturation: 31.8 %
Patient temperature: 36.4
pCO2, Ven: 48 mmHg (ref 44.0–60.0)
pH, Ven: 7.126 — CL (ref 7.250–7.430)
pO2, Ven: <31 mmHg — CL (ref 32.0–45.0)

## 2019-11-15 LAB — BETA-HYDROXYBUTYRIC ACID
Beta-Hydroxybutyric Acid: 5.75 mmol/L — ABNORMAL HIGH (ref 0.05–0.27)
Beta-Hydroxybutyric Acid: 3.32 mmol/L — ABNORMAL HIGH (ref 0.05–0.27)
Beta-Hydroxybutyric Acid: 0.49 mmol/L — ABNORMAL HIGH (ref 0.05–0.27)

## 2019-11-15 LAB — TROPONIN I (HIGH SENSITIVITY)
Troponin I (High Sensitivity): 4 ng/L (ref <18)
Troponin I (High Sensitivity): 6 ng/L (ref <18)

## 2019-11-15 LAB — RESPIRATORY PANEL BY RT PCR (FLU A&B, COVID)
Influenza A by PCR: NEGATIVE
Influenza B by PCR: NEGATIVE
SARS Coronavirus 2 by RT PCR: NEGATIVE

## 2019-11-15 LAB — GLUCOSE, CAPILLARY
Glucose-Capillary: 171 mg/dL — ABNORMAL HIGH (ref 70–99)
Glucose-Capillary: 149 mg/dL — ABNORMAL HIGH (ref 70–99)
Glucose-Capillary: 141 mg/dL — ABNORMAL HIGH (ref 70–99)
Glucose-Capillary: 122 mg/dL — ABNORMAL HIGH (ref 70–99)
Glucose-Capillary: 139 mg/dL — ABNORMAL HIGH (ref 70–99)

## 2019-11-15 LAB — HEMOGLOBIN A1C
Hgb A1c MFr Bld: 7.8 % — ABNORMAL HIGH (ref 4.8–5.6)
Mean Plasma Glucose: 177.16 mg/dL

## 2019-11-15 LAB — PHOSPHORUS: Phosphorus: 3.7 mg/dL (ref 2.5–4.6)

## 2019-11-15 LAB — MAGNESIUM: Magnesium: 1.8 mg/dL (ref 1.7–2.4)

## 2019-11-15 LAB — LIPASE, BLOOD: Lipase: 24 U/L (ref 11–51)

## 2019-11-15 MED ORDER — DEXTROSE 50 % IV SOLN: 0.0000 mL | INTRAVENOUS | Status: DC | PRN

## 2019-11-15 MED ORDER — SODIUM CHLORIDE 0.9 % IV SOLN: INTRAVENOUS | Status: DC

## 2019-11-15 MED ORDER — ASPIRIN EC 81 MG PO TBEC
81.0000 mg | DELAYED_RELEASE_TABLET | Freq: Every day | ORAL | Status: DC
  Administered 2019-11-16 – 2019-11-17 (×2): 81 mg via ORAL
  Filled 2019-11-15 (×2): qty 1

## 2019-11-15 MED ORDER — INSULIN ASPART 100 UNIT/ML IV SOLN
10.0000 [IU] | Freq: Once | INTRAVENOUS | Status: AC
  Administered 2019-11-15: 10 [IU] via INTRAVENOUS

## 2019-11-15 MED ORDER — TOPIRAMATE 100 MG PO TABS
100.0000 mg | ORAL_TABLET | Freq: Every day | ORAL | Status: DC
  Administered 2019-11-15 – 2019-11-16 (×2): 100 mg via ORAL
  Filled 2019-11-15 (×2): qty 1

## 2019-11-15 MED ORDER — SODIUM CHLORIDE 0.9 % IV BOLUS
1000.0000 mL | Freq: Once | INTRAVENOUS | Status: AC
  Administered 2019-11-15: 1000 mL via INTRAVENOUS

## 2019-11-15 MED ORDER — ACETAMINOPHEN 325 MG PO TABS: 650.0000 mg | ORAL_TABLET | Freq: Four times a day (QID) | ORAL | Status: DC | PRN

## 2019-11-15 MED ORDER — PANTOPRAZOLE SODIUM 40 MG PO TBEC: 40.0000 mg | DELAYED_RELEASE_TABLET | Freq: Every day | ORAL | Status: DC

## 2019-11-15 MED ORDER — HEPARIN SODIUM (PORCINE) 5000 UNIT/ML IJ SOLN
5000.0000 [IU] | Freq: Three times a day (TID) | INTRAMUSCULAR | Status: DC
  Administered 2019-11-15 – 2019-11-17 (×5): 5000 [IU] via SUBCUTANEOUS
  Filled 2019-11-15 (×5): qty 1

## 2019-11-15 MED ORDER — PANTOPRAZOLE SODIUM 40 MG PO TBEC
40.0000 mg | DELAYED_RELEASE_TABLET | Freq: Two times a day (BID) | ORAL | Status: DC
  Administered 2019-11-15 – 2019-11-17 (×4): 40 mg via ORAL
  Filled 2019-11-15 (×4): qty 1

## 2019-11-15 MED ORDER — INSULIN REGULAR(HUMAN) IN NACL 100-0.9 UT/100ML-% IV SOLN
INTRAVENOUS | Status: DC
  Administered 2019-11-15: 11.5 [IU]/h via INTRAVENOUS
  Filled 2019-11-15: qty 100

## 2019-11-15 MED ORDER — LEVOTHYROXINE SODIUM 125 MCG PO TABS
125.0000 ug | ORAL_TABLET | Freq: Every day | ORAL | Status: DC
  Administered 2019-11-16 – 2019-11-17 (×2): 125 ug via ORAL
  Filled 2019-11-15 (×2): qty 1

## 2019-11-15 MED ORDER — SODIUM CHLORIDE 0.9 % IV BOLUS
250.0000 mL | INTRAVENOUS | Status: AC
  Administered 2019-11-15: 250 mL via INTRAVENOUS

## 2019-11-15 MED ORDER — DEXTROSE-NACL 5-0.45 % IV SOLN: INTRAVENOUS | Status: DC

## 2019-11-15 MED ORDER — POLYVINYL ALCOHOL 1.4 % OP SOLN: 1.0000 [drp] | Freq: Every day | OPHTHALMIC | Status: DC | PRN

## 2019-11-15 MED ORDER — CALCIUM GLUCONATE-NACL 1-0.675 GM/50ML-% IV SOLN
1.0000 g | Freq: Once | INTRAVENOUS | Status: AC
  Administered 2019-11-15: 1000 mg via INTRAVENOUS
  Filled 2019-11-15: qty 50

## 2019-11-15 MED ORDER — ONDANSETRON HCL 4 MG/2ML IJ SOLN: 4.0000 mg | Freq: Four times a day (QID) | INTRAMUSCULAR | Status: DC | PRN

## 2019-11-15 MED ORDER — NITROGLYCERIN 0.4 MG SL SUBL: 0.4000 mg | SUBLINGUAL_TABLET | SUBLINGUAL | Status: DC | PRN

## 2019-11-15 MED ORDER — INSULIN REGULAR(HUMAN) IN NACL 100-0.9 UT/100ML-% IV SOLN
INTRAVENOUS | Status: DC
  Administered 2019-11-15: 5.5 [IU]/h via INTRAVENOUS

## 2019-11-15 MED ORDER — ATORVASTATIN CALCIUM 40 MG PO TABS
80.0000 mg | ORAL_TABLET | Freq: Every day | ORAL | Status: DC
  Administered 2019-11-15 – 2019-11-17 (×3): 80 mg via ORAL
  Filled 2019-11-15 (×3): qty 2

## 2019-11-15 NOTE — ED Notes (Signed)
Date and time results received: 11/15/19 1238(use smartphrase ".now" to insert current time)  Test: k 6.3  Glu 642 Critical Value: see above  Name of Provider Notified: [email or web address]  Orders Received? Or Actions Taken?:

## 2019-11-15 NOTE — H&P (Signed)
History and Physical    Jodi Ray UXN:[id number] DOB: 02-13-1961 DOA: 11/15/2019  PCP: Monico Blitz, MD  Patient coming from: home  Chief Complaint: Elevated troponin chest pain  HPI: Jodi Ray is a 59 y.o. female with medical history significant of type 1 diabetes mellitus (insulin-dependent and using insulin pump), history of coronary artery disease s/p drug-eluting stent to proximal LAD, GERD, hypertension, hyperlipidemia, hypothyroidism, class I obesity, obstructive sleep apnea on CPAP, migraines and chronic kidney disease a stage IIIb; who presented to the hospital secondary to chest discomfort and high blood sugars.  Patient expressed pain in her chest woke her up this morning, sudden onset, localized in the middle of the chest without radiation, 6-7 out of 10 in intensity, no aggravating factors, associated with nausea and vomiting; patient reports gradual but steady improvement in symptoms over 1-2 hours.  She reported using aspirin and nitroglycerin without improvement after taking it immediately.  Patient noticed that her insulin pump was disconnected and that could potentially be the reason why her sugar was running so high.  She reports prior history of DKA's but none in the recent years.   Of note, patient denies shortness of breath, dysuria, hematuria, fever, chills, hematemesis, melena, hematochezia focal weakness or any other complaints.  ED Course: Blood work demonstrating acute DKA; chest x-ray without acute abnormalities, negative troponin and no EKG changes for acute ischemia.  IV fluid bolus and insulin drip has been started; TRH consulted to further assist with evaluation and management of her DKA.  Review of Systems: As per HPI otherwise all other systems reviewed and are negative.   Past Medical History:  Diagnosis Date  . Adult RDS ([place])   . Anemia   . Brain aneurysm    frontal lobe  . CAD (coronary artery disease)    a. 02/2018: s/p DES to Proximal  LAD with residual 20% RCA stenosis.  . Chronic pain   . Diabetes mellitus without complication ([place])   . Headache   . History of left bundle branch block (LBBB)   . HTN (hypertension)   . Hx of cardiovascular stress test 10/2016   intermediate risk study  . Hypothyroidism   . Neuromuscular disorder ([place])   . Neuropathy   . Obesity   . OSA on CPAP 03/10/2015  . Retinopathy   . Sleep apnea   . Varicose veins of both lower extremities     Past Surgical History:  Procedure Laterality Date  . BIOPSY  11/14/2016   Procedure: BIOPSY;  Surgeon: Danie Binder, MD;  Location: [facility];  Service: Endoscopy;;  colon gastric duodenal  . BREAST SURGERY Left 1994   Lumpectomy  . COLONOSCOPY WITH PROPOFOL N/A 11/14/2016   Dr. Oneida Alar: Moderately redundant rectosigmoid colon. Random colon biopsies benign. Internal hemorrhoids. Surveillance colonoscopy in 5 years.  . CORONARY STENT INTERVENTION N/A 03/22/2018   Procedure: CORONARY STENT INTERVENTION;  Surgeon: Burnell Blanks, MD;  Location: [facility];  Service: Cardiovascular;  Laterality: N/A;  . ESOPHAGOGASTRODUODENOSCOPY (EGD) WITH PROPOFOL N/A 11/14/2016   Dr. Oneida Alar: Esophagus normal. Moderate gastritis, few gastric polyps. Duodenal biopsies negative. Fundic gland polyp gastric polyp area no H pylori.  Marland Kitchen FOOT SURGERY    . LEFT HEART CATH AND CORONARY ANGIOGRAPHY N/A 03/22/2018   Procedure: LEFT HEART CATH AND CORONARY ANGIOGRAPHY;  Surgeon: Burnell Blanks, MD;  Location: [facility];  Service: Cardiovascular;  Laterality: N/A;  . LEG SURGERY Right   . REPLACEMENT TOTAL  KNEE Right   . SPINAL CORD STIMULATOR IMPLANT      Social History  reports that she has never smoked. She has never used smokeless tobacco. She reports that she does not drink alcohol or use drugs.  Allergies  Allergen Reactions  . Fentanyl Nausea And Vomiting and Other (See Comments)    Nausea, vomiting, loss of consciousness,  requiring reversal    Family History  Problem Relation Age of Onset  . Cancer Mother 66       colon  . Cancer Father        renal cell  . Cancer Brother        bladder  . Cancer Sister        primary brain, and primary breast too  . Cancer Sister        breast   . Cancer Other        multiple aunts with breast, cervical, or colon cancer     Prior to Admission medications   Medication Sig Start Date End Date Taking? Authorizing Provider  aspirin EC 81 MG tablet Take 81 mg by mouth daily.   Yes [provider]  atorvastatin (LIPITOR) 80 MG tablet TAKE ONE TABLET BY MOUTH DAILY AT 6 PM 04/18/19  Yes Herminio Commons, MD  Cholecalciferol (VITAMIN D-3) 5000 units TABS Take 5,000 Units by mouth daily.    Yes [provider]  Coenzyme Q10 (COQ-10) 100 MG CAPS Take 1 capsule by mouth daily.   Yes [provider]  EPINEPHrine 0.3 mg/0.3 mL IJ SOAJ injection Inject 0.3 mg into the muscle as needed. 04/24/18  Yes [provider]  furosemide (LASIX) 20 MG tablet Take 1 tablet (20 mg total) by mouth as needed for up to 30 days. 10/18/18 11/15/19 Yes Herminio Commons, MD  Galcanezumab-gnlm (EMGALITY) 120 MG/ML SOAJ Inject 120 mg into the skin every 30 (thirty) days. 11/13/19  Yes Jaffe, Adam R, DO  Insulin Human (INSULIN PUMP) SOLN Inject into the skin continuous.    Yes [provider]  levothyroxine (SYNTHROID, LEVOTHROID) 125 MCG tablet Take 125 mcg by mouth daily before breakfast.  11/01/16  Yes [provider]  lisinopril (ZESTRIL) 10 MG tablet Take 10 mg by mouth daily. 02/09/19  Yes [provider]  metFORMIN (GLUCOPHAGE) 500 MG tablet Take 1 tablet (500 mg total) by mouth 2 (two) times daily with a meal. Do not take 03/23/2018 and 03/24/2018. Restart taking from 03/25/2018. Patient taking differently: Take 500 mg by mouth 2 (two) times daily with a meal.  03/23/18  Yes Hongalgi, Lenis Dickinson, MD  Multiple Vitamins-Minerals (CENTRUM  SILVER 50+WOMEN) TABS Take 1 tablet by mouth daily.    Yes [provider]  nitroGLYCERIN (NITROSTAT) 0.4 MG SL tablet Place 1 tablet (0.4 mg total) under the tongue every 5 (five) minutes as needed for chest pain. 04/17/18  Yes Strader, [place] M, PA-C  Omega-3 Fatty Acids (FISH OIL) 1000 MG CAPS Take 1,000 mg by mouth daily.   Yes [provider]  OXYGEN Inhale into the lungs. As needed   Yes [provider]  pantoprazole (PROTONIX) 40 MG tablet Take 1 tablet (40 mg total) by mouth 2 (two) times daily before a meal. 03/23/18 11/15/19 Yes Hongalgi, Lenis Dickinson, MD  Polyethylene Glycol 400 (BLINK TEARS) 0.25 % SOLN Apply 1 drop to eye daily as needed (for eye irritation/dryness).   Yes [provider]  carvedilol (COREG) 3.125 MG tablet Take 1 tablet (3.125 mg) 1  hour before cardiac rehab 03/13/19   Herminio Commons, MD  NOVOLOG 100 UNIT/ML injection Inject into the skin continuous.  02/24/18   [provider]  topiramate (TOPAMAX) 100 MG tablet Take 3 tablets (300 mg total) by mouth at bedtime. 3 TABLETS AT BEDTIME Patient taking differently: Take 100 mg by mouth at bedtime. 1 TABLET AT BEDTIME 09/04/19   Pieter Partridge, DO    Physical Exam: Vitals:   11/15/19 1400 11/15/19 1415 11/15/19 1430 11/15/19 1445  BP: (!) 120/51 (!) 114/56 (!) 124/50 (!) 148/60  Pulse: 92 88 87 88  Resp: '18 18 20 ' (!) 21  Temp:      TempSrc:      SpO2: 98% 98% 99% 99%  Weight:      Height:        Constitutional: NAD, calm, comfortable; patient is afebrile.  Currently no complaining of chest pain.  Vitals:   11/15/19 1400 11/15/19 1415 11/15/19 1430 11/15/19 1445  BP: (!) 120/51 (!) 114/56 (!) 124/50 (!) 148/60  Pulse: 92 88 87 88  Resp: '18 18 20 ' (!) 21  Temp:      TempSrc:      SpO2: 98% 98% 99% 99%  Weight:      Height:       Eyes: PERRL, lids and conjunctivae normal ENMT: Mucous membranes are moist. Posterior pharynx clear of any exudate or lesions. Neck:  normal, supple, no masses, no thyromegaly Respiratory: clear to auscultation bilaterally, no wheezing, no crackles. Normal respiratory effort. No accessory muscle use.  Cardiovascular: Regular rate and rhythm, positive systolic ejection murmur, no rubs, no gallops, no JVD. Trace lower extremity edema. 2+ pedal pulses. No carotid bruits.  Abdomen: Obese, no tenderness, no masses palpated. No hepatosplenomegaly. Bowel sounds positive.  Musculoskeletal: no clubbing / cyanosis. No joint deformity upper and lower extremities. Good ROM, no contractures. Normal muscle tone.  Skin: no rashes, no petechiae. Neurologic: CN 2-12 grossly intact. Sensation intact, DTR normal. Strength 5/5 in all 4.  Psychiatric: Normal judgment and insight. Alert and oriented x 3. Normal mood.   Labs on Admission: I have personally reviewed following labs and imaging studies  CBC: Recent Labs  Lab 11/15/19 1137  WBC 10.2  NEUTROABS 8.7*  HGB 10.6*  HCT 35.2*  MCV 102.3*  PLT 161    Basic Metabolic Panel: Recent Labs  Lab 11/15/19 1137 11/15/19 1401  NA 135 136  K 6.3* 4.8  CL 103 109  CO2 14* 15*  GLUCOSE 642* 380*  BUN 48* 46*  CREATININE 1.76* 1.65*  CALCIUM 9.1 8.6*  MG  --  1.8  PHOS  --  3.7    GFR: Estimated Creatinine Clearance: 43.3 mL/min (A) (by C-G formula based on SCr of 1.65 mg/dL (H)).  Liver Function Tests: Recent Labs  Lab 11/15/19 1137  AST 24  ALT 32  ALKPHOS 63  BILITOT 1.1  PROT 6.8  ALBUMIN 3.9    Urine analysis:    Component Value Date/Time   COLORURINE AMBER (A) 11/15/2019 1119   APPEARANCEUR CLOUDY (A) 11/15/2019 1119   LABSPEC 1.022 11/15/2019 1119   PHURINE 5.0 11/15/2019 1119   GLUCOSEU >=500 (A) 11/15/2019 1119   HGBUR NEGATIVE 11/15/2019 1119   BILIRUBINUR NEGATIVE 11/15/2019 1119   KETONESUR 20 (A) 11/15/2019 1119   PROTEINUR NEGATIVE 11/15/2019 1119   NITRITE NEGATIVE 11/15/2019 1119   LEUKOCYTESUR NEGATIVE 11/15/2019 1119    Radiological Exams  on Admission: DG Chest Port 1 View  Result Date:  11/15/2019 CLINICAL DATA:  Chest pain. EXAM: PORTABLE CHEST 1 VIEW COMPARISON:  02/21/2019 FINDINGS: Dorsal column stimulator again noted. Atherosclerotic calcification of the aortic arch. Heart size within normal limits for projection. Degenerative glenohumeral arthropathy bilaterally. No blunting of the costophrenic angles. The lungs appear clear. IMPRESSION: 1. No active cardiopulmonary disease is radiographically apparent. 2. Atherosclerotic calcification of the aortic arch. Electronically Signed   By: Van Clines M.D.   On: 11/15/2019 14:15    EKG: Independently reviewed.  Some nonspecific T wave abnormalities; normal axis, sinus rhythm.  Assessment/Plan 1-DKA, type 1 ([place]) -In the setting of insulin pump malfunction -Patient met DKA on admission with anion gap of 18, bicarb 14 and beta-hydroxybutyric acid of 5.75 -IV fluid boluses and insulin drip has been initiated-patient admitted to stepdown -N.p.o. status -Insulin pump On hold for now. -Close monitoring of patient electrolytes and renal function to be pursued. -No signs of acute infection appreciated. -Will check A1c.  2-acute on chronic renal failure: Stage IIIb at baseline -In the setting of dehydration/prerenal azotemia from DKA status and continue use of nephrotoxic agents. -Holding nephrotoxic agents -IV fluids will be provided -Correct DKA -Follow renal function trend. -No dysuria and UA no suggesting UTI.  3-OSA on CPAP -Resume CPAP nightly  4-Mixed hyperlipidemia -Continue statins  5-Primary hypothyroidism -Check TSH -Continue Synthroid.  6-Benign hypertension -Overall stable and well-controlled. -Continue current home antihypertensive regimen; except for diuretics and nephrotoxic agents.  7-Gastritis and gastroduodenitis -Continue PPI  8-Chest pain -Patient expressed pain woke her up from sleep early this morning and failed to improve on his own  despite the use of aspirin and nitroglycerin. -Currently chest pain-free -No acute ischemic changes on EKG and negative troponin -Most likely reflux in nature. -Given patient heart score of 5 will check 2D echo monitor on telemetry at least overnight. -Continue the use of aspirin, statins and as needed nitrates.  9-Class 1 obesity  -Body mass index is 32.08 kg/m. -Low calorie diet, portion control and increase physical activity discussed with patient.  10-history of migraines -Continue Topamax nightly -Continue outpatient follow-up with neurology service.  DVT prophylaxis: Heparin  Code Status:   Full code Family Communication:  No family at bedside  Disposition Plan:   Patient is from:  Home   Anticipated DC to:  Home   Anticipated DC date:  11/17/19  Anticipated DC barriers: Clinical improvement and resolution of DKA.  Consults called:  None Admission status:  Inpatient, stepdown unit, to stay more than 2 midnights.  Severity of Illness: Moderate risk for further decompensation.    Barton Dubois MD [facility]  How to contact the [facility] Attending or Consulting provider [place] or covering provider during after hours [place], for this patient?   1. Check the care team in [facility] and look for a) attending/consulting TR[facility] provider listed and b) the [facility] team listed 2. Log into [email or web address] and use [place]'s universal password to access. If you do not have the password, please contact the hospital operator. 3. Locate the [facility] provider you are looking for under [facility] and page to a number that you can be directly reached. 4. If you still have difficulty reaching the provider, please page the [facility] (Director on Call) for the Hospitalists listed on amion for assistance.  11/15/2019, 4:22 PM

## 2019-11-15 NOTE — ED Notes (Signed)
ED TO INPATIENT HANDOFF REPORT  ED Nurse Name and Phone #: [phone]  S Name/Age/Gender Jodi Ray 59 y.o. female Room/Bed: APA11/APA11  Code Status   Code Status: Full Code  Home/SNF/Other Home Patient oriented to: self, place, time and situation Is this baseline? Yes   Triage Complete: Triage complete  Chief Complaint DKA, type 1 (Jodi Ann) [E10.10]  Triage Note Pt called EMS this morning due to blood sugar reading HI. Reports waking up not feeling well. Blood sugar 65. Drank cup of coffee and then rechecked 562. Also several hours ago started experiencing CP and took nitro and asa 81 mg x 4. Denies CP at this time. Pt had given herself bolus of insulin via insulin pump. Pt realizes when she gets to ED her insulin tubing has popped out and has not been receiving insulin    Allergies Allergies  Allergen Reactions  . Fentanyl Nausea And Vomiting and Other (See Comments)    Nausea, vomiting, loss of consciousness, requiring reversal    Level of Care/Admitting Diagnosis ED Disposition    ED Disposition Condition [place]: [facility] [[id number]]  Level of Care: Stepdown [14]  Covid Evaluation: Confirmed COVID Negative  Diagnosis: DKA, type 1 ([place]) [[id number]]  Admitting Physician: Meribeth Mattes  Attending Physician: Barton Dubois [[id number]]  Estimated length of stay: past midnight tomorrow  Certification:: I certify this patient will need inpatient services for at least 2 midnights       B Medical/Surgery History Past Medical History:  Diagnosis Date  . Adult RDS ([place])   . Anemia   . Brain aneurysm    frontal lobe  . CAD (coronary artery disease)    a. 02/2018: s/p DES to Proximal LAD with residual 20% RCA stenosis.  . Chronic pain   . Diabetes mellitus without complication ([place])   . Headache   . History of left bundle branch block (LBBB)   . HTN (hypertension)   . Hx of cardiovascular stress test 10/2016   intermediate risk  study  . Hypothyroidism   . Neuromuscular disorder ([place])   . Neuropathy   . Obesity   . OSA on CPAP 03/10/2015  . Retinopathy   . Sleep apnea   . Varicose veins of both lower extremities    Past Surgical History:  Procedure Laterality Date  . BIOPSY  11/14/2016   Procedure: BIOPSY;  Surgeon: Danie Binder, MD;  Location: [facility];  Service: Endoscopy;;  colon gastric duodenal  . BREAST SURGERY Left 1994   Lumpectomy  . COLONOSCOPY WITH PROPOFOL N/A 11/14/2016   Dr. Oneida Alar: Moderately redundant rectosigmoid colon. Random colon biopsies benign. Internal hemorrhoids. Surveillance colonoscopy in 5 years.  . CORONARY STENT INTERVENTION N/A 03/22/2018   Procedure: CORONARY STENT INTERVENTION;  Surgeon: Burnell Blanks, MD;  Location: [place] [facility];  Service: Cardiovascular;  Laterality: N/A;  . ESOPHAGOGASTRODUODENOSCOPY (EGD) WITH PROPOFOL N/A 11/14/2016   Dr. Oneida Alar: Esophagus normal. Moderate gastritis, few gastric polyps. Duodenal biopsies negative. Fundic gland polyp gastric polyp area no H pylori.  Marland Kitchen FOOT SURGERY    . LEFT HEART CATH AND CORONARY ANGIOGRAPHY N/A 03/22/2018   Procedure: LEFT HEART CATH AND CORONARY ANGIOGRAPHY;  Surgeon: Burnell Blanks, MD;  Location: [place] [facility];  Service: Cardiovascular;  Laterality: N/A;  . LEG SURGERY Right   . REPLACEMENT TOTAL KNEE Right   . SPINAL CORD STIMULATOR IMPLANT       A IV Location/Drains/Wounds Patient Lines/Drains/Airways  Status   Active Line/Drains/Airways    Name:   Placement date:   Placement time:   Site:   Days:   Peripheral IV 11/15/19 Right Forearm   11/15/19    1122    Forearm   less than 1   Peripheral IV 11/15/19 Left Forearm   11/15/19    1215    Forearm   less than 1          Intake/Output Last 24 hours  Intake/Output Summary (Last 24 hours) at 11/15/2019 1503 Last data filed at 11/15/2019 1401 Gross per 24 hour  Intake 2007.95 ml  Output --  Net 2007.95 ml     Labs/Imaging Results for orders placed or performed during the hospital encounter of 11/15/19 (from the past 48 hour(s))  CBG monitoring, ED     Status: Abnormal   Collection Time: 11/15/19 11:12 AM  Result Value Ref Range   Glucose-Capillary 559 (HH) 70 - 99 mg/dL    Comment: Glucose reference range applies only to samples taken after fasting for at least 8 hours.   Comment 1 Notify RN   Comprehensive metabolic panel     Status: Abnormal   Collection Time: 11/15/19 11:37 AM  Result Value Ref Range   Sodium 135 135 - 145 mmol/L   Potassium 6.3 (HH) 3.5 - 5.1 mmol/L    Comment: CRITICAL RESULT CALLED TO, READ BACK BY AND VERIFIED WITH: GENTRY R @ 1238 ON [id number] BY HENDERSON L.    Chloride 103 98 - 111 mmol/L   CO2 14 (L) 22 - 32 mmol/L   Glucose, Bld 642 (HH) 70 - 99 mg/dL    Comment: Glucose reference range applies only to samples taken after fasting for at least 8 hours. CRITICAL RESULT CALLED TO, READ BACK BY AND VERIFIED WITH: GENTRY @ 1238 ON [id number] BY HENDERSON L RESULTS CONFIRMED BY MANUAL DILUTION    BUN 48 (H) 8 - 23 mg/dL   Creatinine, Ser 1.76 (H) 0.44 - 1.00 mg/dL   Calcium 9.1 8.9 - 10.3 mg/dL   Total Protein 6.8 6.5 - 8.1 g/dL   Albumin 3.9 3.5 - 5.0 g/dL   AST 24 15 - 41 U/L   ALT 32 0 - 44 U/L   Alkaline Phosphatase 63 38 - 126 U/L   Total Bilirubin 1.1 0.3 - 1.2 mg/dL   GFR calc non Af Amer 31 (L) >60 mL/min   GFR calc Af Amer 36 (L) >60 mL/min   Anion gap 18 (H) 5 - 15    Comment: Performed at [facility], [address] [place], [place] [address]  Lipase, blood     Status: None   Collection Time: 11/15/19 11:37 AM  Result Value Ref Range   Lipase 24 11 - 51 U/L    Comment: Performed at [facility], [address] [place], [place] [address]  CBC with Differential     Status: Abnormal   Collection Time: 11/15/19 11:37 AM  Result Value Ref Range   WBC 10.2 4.0 - 10.5 K/uL   RBC 3.44 (L) 3.87 - 5.11 MIL/uL   Hemoglobin 10.6 (L) 12.0 - 15.0 g/dL    HCT 35.2 (L) 36.0 - 46.0 %   MCV 102.3 (H) 80.0 - 100.0 fL   MCH 30.8 26.0 - 34.0 pg   MCHC 30.1 30.0 - 36.0 g/dL   RDW 13.8 11.5 - 15.5 %   Platelets 167 150 - 400 K/uL   nRBC 0.0 0.0 - 0.2 %  Neutrophils Relative % 85 %   Neutro Abs 8.7 (H) 1.7 - 7.7 K/uL   Lymphocytes Relative 12 %   Lymphs Abs 1.2 0.7 - 4.0 K/uL   Monocytes Relative 3 %   Monocytes Absolute 0.3 0.1 - 1.0 K/uL   Eosinophils Relative 0 %   Eosinophils Absolute 0.0 0.0 - 0.5 K/uL   Basophils Relative 0 %   Basophils Absolute 0.0 0.0 - 0.1 K/uL   Immature Granulocytes 0 %   Abs Immature Granulocytes 0.04 0.00 - 0.07 K/uL    Comment: Performed at [facility], [address], [place], [place] [address]  Troponin I (High Sensitivity)     Status: None   Collection Time: 11/15/19 11:37 AM  Result Value Ref Range   Troponin I (High Sensitivity) 4 <18 ng/L    Comment: (NOTE) Elevated high sensitivity troponin I (hsTnI) values and significant  changes across serial measurements may suggest ACS but many other  chronic and acute conditions are known to elevate hsTnI results.  Refer to the Links section for chest pain algorithms and additional  guidance. Performed at [facility], [address] [place], [place] [address]   Blood gas, venous (at [facility] and [facility], not at [facility])     Status: Abnormal   Collection Time: 11/15/19 11:37 AM  Result Value Ref Range   FIO2 21.00    pH, Ven 7.126 (LL) 7.250 - 7.430    Comment: CRITICAL RESULT CALLED TO, READ BACK BY AND VERIFIED WITH: GENTRY R. @ 1151 ON [id number] BY HENDESON L.    pCO2, Ven 48.0 44.0 - 60.0 mmHg   pO2, Ven <31.0 (LL) 32.0 - 45.0 mmHg    Comment: CRITICAL RESULT CALLED TO, READ BACK BY AND VERIFIED WITH: GENTRY R. @ 1151 ON [id number] BY HENDERSON L.    Bicarbonate 13.2 (L) 20.0 - 28.0 mmol/L   Acid-base deficit 12.4 (H) 0.0 - 2.0 mmol/L   O2 Saturation 31.8 %   Patient temperature 36.4     Comment: CRITICAL RESULT CALLED TO, READ BACK BY AND VERIFIED WITH: Performed  at [facility], [address] [place], [place] [address]   Beta-hydroxybutyric acid     Status: Abnormal   Collection Time: 11/15/19 11:37 AM  Result Value Ref Range   Beta-Hydroxybutyric Acid 5.75 (H) 0.05 - 0.27 mmol/L    Comment: RESULTS CONFIRMED BY MANUAL DILUTION Performed at [facility], [address], [place], [place] [address]   CBG monitoring, ED     Status: Abnormal   Collection Time: 11/15/19 11:56 AM  Result Value Ref Range   Glucose-Capillary 529 (HH) 70 - 99 mg/dL    Comment: Glucose reference range applies only to samples taken after fasting for at least 8 hours.   Comment 1 Notify RN   Respiratory Panel by RT PCR (Flu A&B, Covid) - Nasopharyngeal Swab     Status: None   Collection Time: 11/15/19 12:25 PM   Specimen: Nasopharyngeal Swab  Result Value Ref Range   SARS Coronavirus 2 by RT PCR NEGATIVE NEGATIVE    Comment: (NOTE) SARS-CoV-2 target nucleic acids are NOT DETECTED. The SARS-CoV-2 RNA is generally detectable in upper respiratoy specimens during the acute phase of infection. The lowest concentration of SARS-CoV-2 viral copies this assay can detect is 131 copies/mL. A negative result does not preclude SARS-Cov-2 infection and should not be used as the sole basis for treatment or other patient management decisions. A negative result may occur with  improper specimen collection/handling, submission of  specimen other than nasopharyngeal swab, presence of viral mutation(s) within the areas targeted by this assay, and inadequate number of viral copies (<131 copies/mL). A negative result must be combined with clinical observations, patient history, and epidemiological information. The expected result is Negative. Fact Sheet for Patients:  PinkCheek.be Fact Sheet for Healthcare Providers:  GravelBags.it This test is not yet ap proved or cleared by the [place] FDA and  has been authorized for  detection and/or diagnosis of SARS-CoV-2 by FDA under an Emergency Use Authorization (EUA). This EUA will remain  in effect (meaning this test can be used) for the duration of the COVID-19 declaration under Section 564(b)(1) of the Act, 21 U.S.C. section 360bbb-3(b)(1), unless the authorization is terminated or revoked sooner.    Influenza A by PCR NEGATIVE NEGATIVE   Influenza B by PCR NEGATIVE NEGATIVE    Comment: (NOTE) The Xpert Xpress SARS-CoV-2/FLU/RSV assay is intended as an aid in  the diagnosis of influenza from Nasopharyngeal swab specimens and  should not be used as a sole basis for treatment. Nasal washings and  aspirates are unacceptable for Xpert Xpress SARS-CoV-2/FLU/RSV  testing. Fact Sheet for Patients: PinkCheek.be Fact Sheet for Healthcare Providers: GravelBags.it This test is not yet approved or cleared by the [place] FDA and  has been authorized for detection and/or diagnosis of SARS-CoV-2 by  FDA under an Emergency Use Authorization (EUA). This EUA will remain  in effect (meaning this test can be used) for the duration of the  Covid-19 declaration under Section 564(b)(1) of the Act, 21  U.S.C. section 360bbb-3(b)(1), unless the authorization is  terminated or revoked. Performed at [facility], [address] [place], [place] [address]   CBG monitoring, ED     Status: Abnormal   Collection Time: 11/15/19  1:22 PM  Result Value Ref Range   Glucose-Capillary 347 (H) 70 - 99 mg/dL    Comment: Glucose reference range applies only to samples taken after fasting for at least 8 hours.  Troponin I (High Sensitivity)     Status: None   Collection Time: 11/15/19  2:01 PM  Result Value Ref Range   Troponin I (High Sensitivity) 6 <18 ng/L    Comment: (NOTE) Elevated high sensitivity troponin I (hsTnI) values and significant  changes across serial measurements may suggest ACS but many other  chronic and  acute conditions are known to elevate hsTnI results.  Refer to the "Links" section for chest pain algorithms and additional  guidance. Performed at [facility], [address] [place], [place] [address]   Beta-hydroxybutyric acid     Status: Abnormal   Collection Time: 11/15/19  2:01 PM  Result Value Ref Range   Beta-Hydroxybutyric Acid 3.32 (H) 0.05 - 0.27 mmol/L    Comment: Performed at [facility], [address] [place], [place] [address]  CBG monitoring, ED     Status: Abnormal   Collection Time: 11/15/19  2:29 PM  Result Value Ref Range   Glucose-Capillary 280 (H) 70 - 99 mg/dL    Comment: Glucose reference range applies only to samples taken after fasting for at least 8 hours.   DG Chest Port 1 View  Result Date: 11/15/2019 CLINICAL DATA:  Chest pain. EXAM: PORTABLE CHEST 1 VIEW COMPARISON:  02/21/2019 FINDINGS: Dorsal column stimulator again noted. Atherosclerotic calcification of the aortic arch. Heart size within normal limits for projection. Degenerative glenohumeral arthropathy bilaterally. No blunting of the costophrenic angles. The lungs appear clear. IMPRESSION: 1. No active cardiopulmonary disease is  radiographically apparent. 2. Atherosclerotic calcification of the aortic arch. Electronically Signed   By: Van Clines M.D.   On: 11/15/2019 14:15    Pending Labs Unresulted Labs (From admission, onward)    Start     Ordered   11/15/19 1342  Magnesium  (Diabetes Ketoacidosis (DKA))  Once,   STAT     11/15/19 1344   11/15/19 1342  Phosphorus  (Diabetes Ketoacidosis (DKA))  Once,   STAT     11/15/19 1344   11/15/19 5329  Basic metabolic panel  (Diabetes Ketoacidosis (DKA))  STAT Now then every 4 hours ,   STAT     11/15/19 1344   11/15/19 1341  Beta-hydroxybutyric acid  (Diabetes Ketoacidosis (DKA))  Now then every 8 hours,   R (with STAT occurrences)     11/15/19 1344   11/15/19 1341  Hemoglobin A1c  (Diabetes Ketoacidosis (DKA))  Once,   STAT    Comments: To  assess prior glycemic control.    11/15/19 1344   11/15/19 1339  HIV Antibody (routine testing w rflx)  (HIV Antibody (Routine testing w reflex) panel)  Once,   STAT     11/15/19 1344   11/15/19 1119  Urinalysis, Routine w reflex microscopic  ONCE - STAT,   STAT     11/15/19 1118          Vitals/Pain Today's Vitals   11/15/19 1315 11/15/19 1330 11/15/19 1345 11/15/19 1400  BP: (!) 123/58 120/62 (!) 127/50 (!) 120/51  Pulse: 94 95 92 92  Resp: 20 20 (!) 22 18  Temp:      TempSrc:      SpO2: 98% 94% 96% 98%  Weight:      Height:      PainSc:        Isolation Precautions No active isolations  Medications Medications  0.9 %  sodium chloride infusion ( Intravenous Other (enter comment in med admin window) 11/15/19 1353)  dextrose 5 %-0.45 % sodium chloride infusion (has no administration in time range)  dextrose 50 % solution 0-50 mL (has no administration in time range)  calcium gluconate 1 g/ 50 mL sodium chloride IVPB (1,000 mg Intravenous New Bag/Given 11/15/19 1405)  heparin injection 5,000 Units (has no administration in time range)  insulin regular, human (MYXREDLIN) 100 units/ 100 mL infusion (6 Units/hr Intravenous Rate/Dose Change 11/15/19 1435)  sodium chloride 0.9 % bolus 250 mL (250 mLs Intravenous New Bag/Given 11/15/19 1402)  ondansetron (ZOFRAN) injection 4 mg (has no administration in time range)  acetaminophen (TYLENOL) tablet 650 mg (has no administration in time range)  aspirin EC tablet 81 mg (has no administration in time range)  atorvastatin (LIPITOR) tablet 80 mg (has no administration in time range)  levothyroxine (SYNTHROID) tablet 125 mcg (has no administration in time range)  nitroGLYCERIN (NITROSTAT) SL tablet 0.4 mg (has no administration in time range)  topiramate (TOPAMAX) tablet 100 mg (has no administration in time range)  polyvinyl alcohol (LIQUIFILM TEARS) 1.4 % ophthalmic solution 1 drop (has no administration in time range)  pantoprazole  (PROTONIX) EC tablet 40 mg (has no administration in time range)  insulin aspart (novoLOG) injection 10 Units (10 Units Intravenous Given 11/15/19 1132)  sodium chloride 0.9 % bolus 1,000 mL (0 mLs Intravenous Stopped 11/15/19 1252)  sodium chloride 0.9 % bolus 1,000 mL (0 mLs Intravenous Stopped 11/15/19 1401)    Mobility walks with device Moderate fall risk   Focused Assessments    R Recommendations: See Admitting  Provider Note  Report given to:   Additional Notes:

## 2019-11-15 NOTE — ED Notes (Signed)
Pt c/o chest pain, BP 92/29, EKG sent. EDP asked to come evaluate pt.

## 2019-11-15 NOTE — ED Triage Notes (Signed)
Pt called EMS this morning due to blood sugar reading HI. Reports waking up not feeling well. Blood sugar 65. Drank cup of coffee and then rechecked 562. Also several hours ago started experiencing CP and took nitro and asa 81 mg x 4. Denies CP at this time. Pt had given herself bolus of insulin via insulin pump. Pt realizes when she gets to ED her insulin tubing has popped out and has not been receiving insulin

## 2019-11-15 NOTE — ED Provider Notes (Signed)
[facility] Provider Note   CSN: [id number] Arrival date & time: 11/15/19  1101     History Chief Complaint  Patient presents with  . Hyperglycemia  . Chest Pain    Jodi Ray is a 59 y.o. female with PMHx CAD s/p stent to LAD (03/22/2018), type 1 diabetes, HTN, hypothyroidism, OSA on CPAP who presents to the ED via EMS with complaint of sudden onset, constant, sharp, substernal chest pain that began around 9:45 AM this morning. Pt took a 324 mg ASA and 1 NTG with resolution of her chest pain. Pt reports she woke up this morning feeling fatigued and checked her BS which was 65. She then drank a cup of coffee with splenda in it. Pt rechecked her sugar shortly afterwards which read 562. Shortly afterwards pt began having the chest pain which concerned her as she thought she was having another MI. Reports similar symptoms in 2019 with her MI. Pt also complains of shortness of breath with the chest pain and nausea and NBNB emesis. Pt was given Zofran en route with resolution of her nausea. She now reports dry mouth sensation which she relates to her elevated BS.   The history is provided by the patient, medical records and the EMS personnel.   Heart Cath 03/22/2018  Prox RCA lesion is 20% stenosed.  Prox LAD lesion is 80% stenosed.  A drug-eluting stent was successfully placed using a STENT SIERRA 3.00 X 18 MM.  Post intervention, there is a 0% residual stenosis.  The left ventricular systolic function is normal.  LV end diastolic pressure is normal.  The left ventricular ejection fraction is 55-65% by visual estimate.  There is no mitral valve regurgitation.   1. Severe stenosis proximal LAD 2. Successful PTCA/DES x 1 proximal LAD 3. Mild non-obstructive disease in the RCA\ 4. Normal LV systolic function    Past Medical History:  Diagnosis Date  . Adult RDS ([place])   . Anemia   . Brain aneurysm    frontal lobe  . CAD (coronary artery disease)    a. 02/2018: s/p DES to Proximal LAD with residual 20% RCA stenosis.  . Chronic pain   . Diabetes mellitus without complication ([place])   . Headache   . History of left bundle branch block (LBBB)   . HTN (hypertension)   . Hx of cardiovascular stress test 10/2016   intermediate risk study  . Hypothyroidism   . Neuromuscular disorder ([place])   . Neuropathy   . Obesity   . OSA on CPAP 03/10/2015  . Retinopathy   . Sleep apnea   . Varicose veins of both lower extremities     Patient Active Problem List   Diagnosis Date Noted  . DKA, type 1 ([place]) 11/15/2019  . Class 1 obesity 11/15/2019  . Acute renal failure superimposed on stage 3 chronic kidney disease ([place]) 11/15/2019  . CAD (coronary artery disease) 04/16/2018  . Unstable angina ([place]) 03/21/2018  . Chest pain 03/20/2018  . Gastritis and gastroduodenitis 03/05/2017  . Suprapubic pain 03/05/2017  . Right sided abdominal pain 10/24/2016  . Diarrhea 10/24/2016  . Family hx of colon cancer 10/24/2016  . Varicose veins of bilateral lower extremities with other complications 02/40/9735  . 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 Type I diabetes mellitus, uncontrolled ([place]) 04/30/2015  . Mixed hyperlipidemia 04/30/2015  . Primary hypothyroidism 04/30/2015  . Benign hypertension 04/30/2015  . OSA on CPAP 03/10/2015  . Migraine without aura and without  status migrainosus, not intractable 01/08/2015  . Primary stabbing headache 10/02/2014    Past Surgical History:  Procedure Laterality Date  . BIOPSY  11/14/2016   Procedure: BIOPSY;  Surgeon: Danie Binder, MD;  Location: [facility];  Service: Endoscopy;;  colon gastric duodenal  . BREAST SURGERY Left 1994   Lumpectomy  . COLONOSCOPY WITH PROPOFOL N/A 11/14/2016   Dr. Oneida Alar: Moderately redundant rectosigmoid colon. Random colon biopsies benign. Internal hemorrhoids. Surveillance colonoscopy in 5 years.  . CORONARY STENT INTERVENTION N/A 03/22/2018   Procedure: CORONARY STENT  INTERVENTION;  Surgeon: Burnell Blanks, MD;  Location: [facility];  Service: Cardiovascular;  Laterality: N/A;  . ESOPHAGOGASTRODUODENOSCOPY (EGD) WITH PROPOFOL N/A 11/14/2016   Dr. Oneida Alar: Esophagus normal. Moderate gastritis, few gastric polyps. Duodenal biopsies negative. Fundic gland polyp gastric polyp area no H pylori.  [facility] Kitchen FOOT SURGERY    . LEFT HEART CATH AND CORONARY ANGIOGRAPHY N/A 03/22/2018   Procedure: LEFT HEART CATH AND CORONARY ANGIOGRAPHY;  Surgeon: Burnell Blanks, MD;  Location: [facility];  Service: Cardiovascular;  Laterality: N/A;  . LEG SURGERY Right   . REPLACEMENT TOTAL KNEE Right   . SPINAL CORD STIMULATOR IMPLANT       OB History    Gravida      Para      Term      Preterm      AB      Living  0     SAB      TAB      Ectopic      Multiple      Live Births              Family History  Problem Relation Age of Onset  . Cancer Mother 29       colon  . Cancer Father        renal cell  . Cancer Brother        bladder  . Cancer Sister        primary brain, and primary breast too  . Cancer Sister        breast   . Cancer Other        multiple aunts with breast, cervical, or colon cancer    Social History   Tobacco Use  . Smoking status: Never Smoker  . Smokeless tobacco: Never Used  Substance Use Topics  . Alcohol use: No    Alcohol/week: 0.0 standard drinks  . Drug use: No    Home Medications Prior to Admission medications   Medication Sig Start Date End Date Taking? Authorizing Provider  aspirin EC 81 MG tablet Take 81 mg by mouth daily.   Yes [provider]  atorvastatin (LIPITOR) 80 MG tablet TAKE ONE TABLET BY MOUTH DAILY AT 6 PM 04/18/19  Yes Herminio Commons, MD  Cholecalciferol (VITAMIN D-3) 5000 units TABS Take 5,000 Units by mouth daily.    Yes [provider]  Coenzyme Q10 (COQ-10) 100 MG CAPS Take 1 capsule by mouth daily.   Yes [provider]    EPINEPHrine 0.3 mg/0.3 mL IJ SOAJ injection Inject 0.3 mg into the muscle as needed. 04/24/18  Yes [provider]  furosemide (LASIX) 20 MG tablet Take 1 tablet (20 mg total) by mouth as needed for up to 30 days. 10/18/18 11/15/19 Yes Herminio Commons, MD  Galcanezumab-gnlm (EMGALITY) 120 MG/ML SOAJ Inject 120 mg into the skin every 30 (thirty) days. 11/13/19  Yes Jaffe, Adam R, DO  Insulin Human (INSULIN PUMP) SOLN Inject into the skin continuous.    Yes [provider]  levothyroxine (SYNTHROID, LEVOTHROID) 125 MCG tablet Take 125 mcg by mouth daily before breakfast.  11/01/16  Yes [provider]  lisinopril (ZESTRIL) 10 MG tablet Take 10 mg by mouth daily. 02/09/19  Yes [provider]  metFORMIN (GLUCOPHAGE) 500 MG tablet Take 1 tablet (500 mg total) by mouth 2 (two) times daily with a meal. Do not take 03/23/2018 and 03/24/2018. Restart taking from 03/25/2018. Patient taking differently: Take 500 mg by mouth 2 (two) times daily with a meal.  03/23/18  Yes Hongalgi, Lenis Dickinson, MD  Multiple Vitamins-Minerals (CENTRUM SILVER 50+WOMEN) TABS Take 1 tablet by mouth daily.    Yes [provider]  nitroGLYCERIN (NITROSTAT) 0.4 MG SL tablet Place 1 tablet (0.4 mg total) under the tongue every 5 (five) minutes as needed for chest pain. 04/17/18  Yes Strader, [place] M, PA-C  Omega-3 Fatty Acids (FISH OIL) 1000 MG CAPS Take 1,000 mg by mouth daily.   Yes [provider]  OXYGEN Inhale into the lungs. As needed   Yes [provider]  pantoprazole (PROTONIX) 40 MG tablet Take 1 tablet (40 mg total) by mouth 2 (two) times daily before a meal. 03/23/18 11/15/19 Yes Hongalgi, Lenis Dickinson, MD  Polyethylene Glycol 400 (BLINK TEARS) 0.25 % SOLN Apply 1 drop to eye daily as needed (for eye irritation/dryness).   Yes [provider]  carvedilol (COREG) 3.125 MG tablet Take 1 tablet (3.125 mg) 1 hour before cardiac rehab 03/13/19   Herminio Commons, MD   NOVOLOG 100 UNIT/ML injection Inject into the skin continuous.  02/24/18   [provider]  topiramate (TOPAMAX) 100 MG tablet Take 3 tablets (300 mg total) by mouth at bedtime. 3 TABLETS AT BEDTIME Patient taking differently: Take 100 mg by mouth at bedtime. 1 TABLET AT BEDTIME 09/04/19   Pieter Partridge, DO    Allergies    Fentanyl  Review of Systems   Review of Systems  Constitutional: Negative for chills and fever.  Respiratory: Positive for shortness of breath.   Cardiovascular: Positive for chest pain.  Gastrointestinal: Positive for nausea and vomiting.  All other systems reviewed and are negative.   Physical Exam Updated Vital Signs BP (!) 110/45   Pulse 94   Temp 97.6 F (36.4 C) (Oral)   Resp (!) 24   Ht 5\' 8"  (1.727 m)   Wt 95.7 kg   SpO2 99%   BMI 32.08 kg/m   Physical Exam Vitals and nursing note reviewed.  Constitutional:      Appearance: She is not ill-appearing or diaphoretic.  HENT:     Head: Normocephalic and atraumatic.  Eyes:     Conjunctiva/sclera: Conjunctivae normal.  Cardiovascular:     Rate and Rhythm: Normal rate and regular rhythm.     Pulses:          Radial pulses are 2+ on the right side and 2+ on the left side.       Dorsalis pedis pulses are 2+ on the right side and 2+ on the left side.  Pulmonary:     Effort: Pulmonary effort is normal. No tachypnea.     Breath sounds: Normal breath sounds. No decreased breath sounds, wheezing, rhonchi or rales.  Chest:     Chest wall: No tenderness.  Abdominal:     Palpations: Abdomen is soft.  Tenderness: There is no abdominal tenderness. There is no guarding or rebound.  Musculoskeletal:     Cervical back: Neck supple.     Right lower leg: No edema.     Left lower leg: No edema.  Skin:    General: Skin is warm and dry.  Neurological:     Mental Status: She is alert and oriented to person, place, and time.     ED Results / Procedures / Treatments   Labs (all labs ordered are  listed, but only abnormal results are displayed) Labs Reviewed  COMPREHENSIVE METABOLIC PANEL - Abnormal; Notable for the following components:      Result Value   Potassium 6.3 (*)    CO2 14 (*)    Glucose, Bld 642 (*)    BUN 48 (*)    Creatinine, Ser 1.76 (*)    GFR calc non Af Amer 31 (*)    GFR calc Af Amer 36 (*)    Anion gap 18 (*)    All other components within normal limits  CBC WITH DIFFERENTIAL/PLATELET - Abnormal; Notable for the following components:   RBC 3.44 (*)    Hemoglobin 10.6 (*)    HCT 35.2 (*)    MCV 102.3 (*)    Neutro Abs 8.7 (*)    All other components within normal limits  URINALYSIS, ROUTINE W REFLEX MICROSCOPIC - Abnormal; Notable for the following components:   Color, Urine AMBER (*)    APPearance CLOUDY (*)    Glucose, UA >=500 (*)    Ketones, ur 20 (*)    Bacteria, UA RARE (*)    All other components within normal limits  BLOOD GAS, VENOUS - Abnormal; Notable for the following components:   pH, Ven 7.126 (*)    pO2, Ven <31.0 (*)    Bicarbonate 13.2 (*)    Acid-base deficit 12.4 (*)    All other components within normal limits  BETA-HYDROXYBUTYRIC ACID - Abnormal; Notable for the following components:   Beta-Hydroxybutyric Acid 5.75 (*)    All other components within normal limits  BASIC METABOLIC PANEL - Abnormal; Notable for the following components:   CO2 15 (*)    Glucose, Bld 380 (*)    BUN 46 (*)    Creatinine, Ser 1.65 (*)    Calcium 8.6 (*)    GFR calc non Af Amer 33 (*)    GFR calc Af Amer 38 (*)    All other components within normal limits  BETA-HYDROXYBUTYRIC ACID - Abnormal; Notable for the following components:   Beta-Hydroxybutyric Acid 3.32 (*)    All other components within normal limits  CBG MONITORING, ED - Abnormal; Notable for the following components:   Glucose-Capillary 559 (*)    All other components within normal limits  CBG MONITORING, ED - Abnormal; Notable for the following components:   Glucose-Capillary  529 (*)    All other components within normal limits  CBG MONITORING, ED - Abnormal; Notable for the following components:   Glucose-Capillary 347 (*)    All other components within normal limits  CBG MONITORING, ED - Abnormal; Notable for the following components:   Glucose-Capillary 280 (*)    All other components within normal limits  CBG MONITORING, ED - Abnormal; Notable for the following components:   Glucose-Capillary 216 (*)    All other components within normal limits  CBG MONITORING, ED - Abnormal; Notable for the following components:   Glucose-Capillary 197 (*)    All other components within  normal limits  RESPIRATORY PANEL BY RT PCR (FLU A&B, COVID)  LIPASE, BLOOD  MAGNESIUM  PHOSPHORUS  HIV ANTIBODY (ROUTINE TESTING W REFLEX)  BASIC METABOLIC PANEL  BASIC METABOLIC PANEL  BASIC METABOLIC PANEL  BETA-HYDROXYBUTYRIC ACID  HEMOGLOBIN A1C  TROPONIN I (HIGH SENSITIVITY)  TROPONIN I (HIGH SENSITIVITY)    EKG EKG Interpretation  Date/Time:  Saturday November 15 2019 11:22:17 EDT Ventricular Rate:  95 PR Interval:    QRS Duration: 160 QT Interval:  417 QTC Calculation: 525 R Axis:   52 Text Interpretation: Sinus rhythm IVCD, consider atypical LBBB Similar to prior. No STEMI Confirmed by Nanda Quinton [phone]) on 11/15/2019 11:25:00 AM   Radiology DG Chest Port 1 View  Result Date: 11/15/2019 CLINICAL DATA:  Chest pain. EXAM: PORTABLE CHEST 1 VIEW COMPARISON:  02/21/2019 FINDINGS: Dorsal column stimulator again noted. Atherosclerotic calcification of the aortic arch. Heart size within normal limits for projection. Degenerative glenohumeral arthropathy bilaterally. No blunting of the costophrenic angles. The lungs appear clear. IMPRESSION: 1. No active cardiopulmonary disease is radiographically apparent. 2. Atherosclerotic calcification of the aortic arch. Electronically Signed   By: Van Clines M.D.   On: 11/15/2019 14:15    Procedures .Critical Care Performed  by: Eustaquio Maize, PA-C Authorized by: Eustaquio Maize, PA-C   Critical care provider statement:    Critical care time (minutes):  45   Critical care was necessary to treat or prevent imminent or life-threatening deterioration of the following conditions:  Endocrine crisis   Critical care was time spent personally by me on the following activities:  Discussions with consultants, evaluation of patient's response to treatment, examination of patient, ordering and performing treatments and interventions, ordering and review of laboratory studies, ordering and review of radiographic studies, pulse oximetry, re-evaluation of patient's condition, obtaining history from patient or surrogate and review of old charts   (including critical care time)  Medications Ordered in ED Medications  0.9 %  sodium chloride infusion ( Intravenous Other (enter comment in med admin window) 11/15/19 1353)  dextrose 5 %-0.45 % sodium chloride infusion ( Intravenous Transfusing/Transfer 11/15/19 1624)  dextrose 50 % solution 0-50 mL (has no administration in time range)  heparin injection 5,000 Units (has no administration in time range)  insulin regular, human (MYXREDLIN) 100 units/ 100 mL infusion (2.8 Units/hr Intravenous Transfusing/Transfer 11/15/19 1625)  sodium chloride 0.9 % bolus 250 mL (0 mLs Intravenous Stopped 11/15/19 1432)  ondansetron (ZOFRAN) injection 4 mg (has no administration in time range)  acetaminophen (TYLENOL) tablet 650 mg (has no administration in time range)  aspirin EC tablet 81 mg (has no administration in time range)  atorvastatin (LIPITOR) tablet 80 mg (has no administration in time range)  levothyroxine (SYNTHROID) tablet 125 mcg (has no administration in time range)  nitroGLYCERIN (NITROSTAT) SL tablet 0.4 mg (has no administration in time range)  topiramate (TOPAMAX) tablet 100 mg (has no administration in time range)  polyvinyl alcohol (LIQUIFILM TEARS) 1.4 % ophthalmic solution 1  drop (has no administration in time range)  pantoprazole (PROTONIX) EC tablet 40 mg (has no administration in time range)  insulin aspart (novoLOG) injection 10 Units (10 Units Intravenous Given 11/15/19 1132)  sodium chloride 0.9 % bolus 1,000 mL (0 mLs Intravenous Stopped 11/15/19 1252)  sodium chloride 0.9 % bolus 1,000 mL (0 mLs Intravenous Stopped 11/15/19 1401)  calcium gluconate 1 g/ 50 mL sodium chloride IVPB (0 g Intravenous Stopped 11/15/19 1525)    ED Course  I have reviewed the triage vital signs  and the nursing notes.  Pertinent labs & imaging results that were available during my care of the patient were reviewed by me and considered in my medical decision making (see chart for details).  Clinical Course as of Nov 15 1638  Sat Nov 15, 2019  1154 Glucose-Capillary(!!): 559 [MV]  1154 pH, Ven(!!): 7.126 [MV]  1154 Bicarbonate(!): 13.2 [MV]  1154 pO2, Ven(!!): <31.0 [MV]  1329 Glucose-Capillary(!): 347 [MV]  1329 Troponin I (High Sensitivity): 4 [MV]    Clinical Course User Index [MV] Eustaquio Maize, PA-C   MDM Rules/Calculators/A&P                      59 year old female with a history of type 1 diabetes and CAD status post stent who presents to the ED via EMS today with complaint of substernal chest pain that began about 2 hours prior to arrival.  Does mention that her CBG this morning was around 60 when she woke up and felt ill.  She drank some coffee with Splenda and then checked her sugar again and was noted to be in the 500s.  She did take 2 nitroglycerin and 324 mg aspirin with relief of her chest pain.  On EMS arrival she was given Zofran with relief of her vomiting.  CBG in the ED 559.  Patient without any chest pain at this time.  Suspect her chest pain was brought on by her hyperglycemia although will work-up for ACS at this time.  Will give 10 units IV insulin with fluids pending labs for DKA.   EKG without new ischemic changes, similar to previous. VBG with the age  of 73.126, bicarb of 13.2.  Consistent with DKA.  Awaiting CMP at this time. Beta hydroxybutyric acid added.   CBC without leukocytosis. Hgb 10.6 today.  CMP with creatinine 1.76 and GFR 31; pt already receiving fluids. Glucose 642 and potassium 6.3, bicarb 14, with gap of 18. Pt started on endotool at this time.   Lab Results  Component Value Date   CREATININE 1.65 (H) 11/15/2019   CREATININE 1.76 (H) 11/15/2019   CREATININE 0.99 04/21/2019   Troponin of 4. Do not suspect chest pain cardiac in nature today; may have been brought on by DKA. Regardless pt will need to be admitted for DKA and likely cardiology to evaluate as well.  Beta hydroxybutryate 5.75  Discussed case with [facility] Dr. Dyann Kief who agrees to evaluate patient for admission.   This note was prepared using Dragon voice recognition software and may include unintentional dictation errors due to the inherent limitations of voice recognition software.   Final Clinical Impression(s) / ED Diagnoses Final diagnoses:  Diabetic ketoacidosis without coma associated with type 1 diabetes mellitus ([place])  Atypical chest pain  AKI (acute kidney injury) [facility])    Rx / DC Orders ED Discharge Orders    None       Eustaquio Maize, PA-C 11/15/19 1641    Long, [facility], MD 11/16/19 0710

## 2019-11-15 NOTE — ED Notes (Signed)
ED Provider at bedside. 

## 2019-11-15 NOTE — ED Notes (Signed)
Dr Long at bedside

## 2019-11-15 NOTE — ED Notes (Signed)
Date and time results received: 11/15/19 1152 (use smartphrase ".now" to insert current time)  Test: VENOUS BLOOD GAS pH 7.126 PO2 <31.0 Critical Value: see above  Name of Provider Notified: [place]  Orders Received? Or Actions Taken?:

## 2019-11-15 NOTE — Plan of Care (Signed)

## 2019-11-16 ENCOUNTER — Inpatient Hospital Stay (HOSPITAL_COMMUNITY): Payer: Medicare Other

## 2019-11-16 DIAGNOSIS — R079 Chest pain, unspecified: Secondary | ICD-10-CM | POA: Diagnosis not present

## 2019-11-16 DIAGNOSIS — K2990 Gastroduodenitis, unspecified, without bleeding: Secondary | ICD-10-CM

## 2019-11-16 DIAGNOSIS — N1831 Chronic kidney disease, stage 3a: Secondary | ICD-10-CM

## 2019-11-16 DIAGNOSIS — E039 Hypothyroidism, unspecified: Secondary | ICD-10-CM

## 2019-11-16 DIAGNOSIS — E875 Hyperkalemia: Secondary | ICD-10-CM

## 2019-11-16 DIAGNOSIS — K2970 Gastritis, unspecified, without bleeding: Secondary | ICD-10-CM

## 2019-11-16 LAB — ECHOCARDIOGRAM COMPLETE
Height: 68 in
Weight: 3382.74 oz

## 2019-11-16 LAB — BASIC METABOLIC PANEL
Anion gap: 9 (ref 5–15)
Anion gap: 9 (ref 5–15)
Anion gap: 8 (ref 5–15)
BUN: 45 mg/dL — ABNORMAL HIGH (ref 8–23)
BUN: 42 mg/dL — ABNORMAL HIGH (ref 8–23)
BUN: 36 mg/dL — ABNORMAL HIGH (ref 8–23)
CO2: 18 mmol/L — ABNORMAL LOW (ref 22–32)
CO2: 18 mmol/L — ABNORMAL LOW (ref 22–32)
CO2: 19 mmol/L — ABNORMAL LOW (ref 22–32)
Calcium: 8.8 mg/dL — ABNORMAL LOW (ref 8.9–10.3)
Calcium: 8.8 mg/dL — ABNORMAL LOW (ref 8.9–10.3)
Calcium: 9 mg/dL (ref 8.9–10.3)
Chloride: 112 mmol/L — ABNORMAL HIGH (ref 98–111)
Chloride: 111 mmol/L (ref 98–111)
Chloride: 111 mmol/L (ref 98–111)
Creatinine, Ser: 1.38 mg/dL — ABNORMAL HIGH (ref 0.44–1.00)
Creatinine, Ser: 1.32 mg/dL — ABNORMAL HIGH (ref 0.44–1.00)
Creatinine, Ser: 1.23 mg/dL — ABNORMAL HIGH (ref 0.44–1.00)
GFR calc Af Amer: 48 mL/min — ABNORMAL LOW (ref >60)
GFR calc Af Amer: 50 mL/min — ABNORMAL LOW (ref >60)
GFR calc Af Amer: 55 mL/min — ABNORMAL LOW (ref >60)
GFR calc non Af Amer: 41 mL/min — ABNORMAL LOW (ref >60)
GFR calc non Af Amer: 43 mL/min — ABNORMAL LOW (ref >60)
GFR calc non Af Amer: 47 mL/min — ABNORMAL LOW (ref >60)
Glucose, Bld: 180 mg/dL — ABNORMAL HIGH (ref 70–99)
Glucose, Bld: 193 mg/dL — ABNORMAL HIGH (ref 70–99)
Glucose, Bld: 175 mg/dL — ABNORMAL HIGH (ref 70–99)
Potassium: 4.5 mmol/L (ref 3.5–5.1)
Potassium: 4.5 mmol/L (ref 3.5–5.1)
Potassium: 4.4 mmol/L (ref 3.5–5.1)
Sodium: 139 mmol/L (ref 135–145)
Sodium: 138 mmol/L (ref 135–145)
Sodium: 138 mmol/L (ref 135–145)

## 2019-11-16 LAB — GLUCOSE, CAPILLARY
Glucose-Capillary: 127 mg/dL — ABNORMAL HIGH (ref 70–99)
Glucose-Capillary: 144 mg/dL — ABNORMAL HIGH (ref 70–99)
Glucose-Capillary: 145 mg/dL — ABNORMAL HIGH (ref 70–99)
Glucose-Capillary: 147 mg/dL — ABNORMAL HIGH (ref 70–99)
Glucose-Capillary: 152 mg/dL — ABNORMAL HIGH (ref 70–99)
Glucose-Capillary: 155 mg/dL — ABNORMAL HIGH (ref 70–99)
Glucose-Capillary: 158 mg/dL — ABNORMAL HIGH (ref 70–99)
Glucose-Capillary: 162 mg/dL — ABNORMAL HIGH (ref 70–99)
Glucose-Capillary: 168 mg/dL — ABNORMAL HIGH (ref 70–99)
Glucose-Capillary: 170 mg/dL — ABNORMAL HIGH (ref 70–99)
Glucose-Capillary: 175 mg/dL — ABNORMAL HIGH (ref 70–99)
Glucose-Capillary: 178 mg/dL — ABNORMAL HIGH (ref 70–99)

## 2019-11-16 LAB — HIV ANTIBODY (ROUTINE TESTING W REFLEX): HIV Screen 4th Generation wRfx: NONREACTIVE

## 2019-11-16 LAB — MRSA PCR SCREENING: MRSA by PCR: NEGATIVE

## 2019-11-16 LAB — BETA-HYDROXYBUTYRIC ACID
Beta-Hydroxybutyric Acid: 1.23 mmol/L — ABNORMAL HIGH (ref 0.05–0.27)
Beta-Hydroxybutyric Acid: 0.25 mmol/L (ref 0.05–0.27)

## 2019-11-16 MED ORDER — INSULIN ASPART 100 UNIT/ML ~~LOC~~ SOLN: 0.0000 [IU] | Freq: Every day | SUBCUTANEOUS | Status: DC

## 2019-11-16 MED ORDER — SODIUM CHLORIDE 0.9 % IV BOLUS
1000.0000 mL | Freq: Once | INTRAVENOUS | Status: AC
  Administered 2019-11-16: 1000 mL via INTRAVENOUS

## 2019-11-16 MED ORDER — INSULIN ASPART 100 UNIT/ML ~~LOC~~ SOLN
3.0000 [IU] | Freq: Three times a day (TID) | SUBCUTANEOUS | Status: DC
  Administered 2019-11-16 – 2019-11-17 (×3): 3 [IU] via SUBCUTANEOUS

## 2019-11-16 MED ORDER — SODIUM CHLORIDE 0.9% FLUSH
3.0000 mL | Freq: Two times a day (BID) | INTRAVENOUS | Status: DC
  Administered 2019-11-16 – 2019-11-17 (×2): 3 mL via INTRAVENOUS

## 2019-11-16 MED ORDER — FAMOTIDINE 20 MG PO TABS
20.0000 mg | ORAL_TABLET | Freq: Every day | ORAL | Status: DC
  Administered 2019-11-16: 20 mg via ORAL
  Filled 2019-11-16: qty 1

## 2019-11-16 MED ORDER — SODIUM CHLORIDE 0.9 % IV SOLN: INTRAVENOUS | Status: DC

## 2019-11-16 MED ORDER — INSULIN ASPART 100 UNIT/ML ~~LOC~~ SOLN
0.0000 [IU] | Freq: Three times a day (TID) | SUBCUTANEOUS | Status: DC
  Administered 2019-11-16 – 2019-11-17 (×2): 3 [IU] via SUBCUTANEOUS
  Administered 2019-11-17: 5 [IU] via SUBCUTANEOUS

## 2019-11-16 MED ORDER — CHLORHEXIDINE GLUCONATE CLOTH 2 % EX PADS
6.0000 | MEDICATED_PAD | Freq: Every day | CUTANEOUS | Status: DC
  Administered 2019-11-16 – 2019-11-17 (×2): 6 via TOPICAL

## 2019-11-16 MED ORDER — INSULIN GLARGINE 100 UNIT/ML ~~LOC~~ SOLN
15.0000 [IU] | Freq: Two times a day (BID) | SUBCUTANEOUS | Status: DC
  Administered 2019-11-16 – 2019-11-17 (×3): 15 [IU] via SUBCUTANEOUS
  Filled 2019-11-16 (×5): qty 0.15

## 2019-11-16 NOTE — Progress Notes (Signed)
While asleep, patient's oxygen saturations noted to be 87-89%. Placed on 2L [place] as patient stated that she was unable to rest and tolerate the CPAP provided to her because the mask was much different than what she is able to tolerate at home. RRT at bedside.

## 2019-11-16 NOTE — Progress Notes (Signed)
Patient's blood pressure decreasing, MD aware.

## 2019-11-16 NOTE — Progress Notes (Signed)
[facility] aware of patient's last BMP and BHA results.

## 2019-11-16 NOTE — Progress Notes (Addendum)
PROGRESS NOTE    Jodi Ray  YTK:[id number] DOB: April 24, 1961 DOA: 11/15/2019 PCP: Monico Blitz, MD    Chief Complaint  Patient presents with  . Hyperglycemia  . Chest Pain    Brief Narrative:  59 y.o. female with medical history significant of type 1 diabetes mellitus (insulin-dependent and using insulin pump), history of coronary artery disease s/p drug-eluting stent to proximal LAD, GERD, hypertension, hyperlipidemia, hypothyroidism, class I obesity, obstructive sleep apnea on CPAP, migraines and chronic kidney disease a stage IIIb; who presented to the hospital secondary to chest discomfort and high blood sugars.  Patient expressed pain in her chest woke her up this morning, sudden onset, localized in the middle of the chest without radiation, 6-7 out of 10 in intensity, no aggravating factors, associated with nausea and vomiting; patient reports gradual but steady improvement in symptoms over 1-2 hours.  She reported using aspirin and nitroglycerin without improvement after taking it immediately.  Patient noticed that her insulin pump was disconnected and that could potentially be the reason why her sugar was running so high.  She reports prior history of DKA's but none in the recent years.   Of note, patient denies shortness of breath, dysuria, hematuria, fever, chills, hematemesis, melena, hematochezia focal weakness or any other complaints.  ED Course: Blood work demonstrating acute DKA; chest x-ray without acute abnormalities, negative troponin and no EKG changes for acute ischemia.  IV fluid bolus and insulin drip has been started; TRH consulted to further assist with evaluation and management of her DKA.  Assessment & Plan: 1-DKA, type 1 (HCC) -Stable bicarb, close gap and BHA levels.  CBGs less than 204 occasions -Transition off insulin drip -start Lantus twice a day along with a sliding scale insulin and meal coverage -A1c 7.8 -Modified carbohydrate diet -Decompensation  and DKA process in the setting of insulin pump malfunction.  2-OSA on CPAP -Continue CPAP nightly.  3-Mixed hyperlipidemia -Continue statins.  4-acute renal failure superimposed on stage III chronic kidney disease: -Stage III at baseline -Continue minimizing nephrotoxic agents and avoiding hypotension -Maintain adequate hydration -Follow renal function trend.  5-Primary hypothyroidism -Continue Synthroid.  6-Benign hypertension -Overall stable and well-controlled -Continue minimizing nephrotoxic agents and continue the rest of her antihypertensive medications. -Low-sodium diet has been encouraged.  7-Gastritis and gastroduodenitis -Continue PPI and add Pepcid nightly for better symptomatic management  8-Chest pain -Atypical -Negative troponin, no EKG or telemetry changes -2D echo reassuring with no wall motion abnormalities and preserved ejection fraction.  9-chronic diastolic heart failure -Grade 1 diastolic dysfunction appreciated on 2D echo -Continue low-sodium diet, daily weights and blood pressure control.  10-hyperkalemia -resolved  -in the setting of DKA and AKI on CKD. -will discontinue telemetry -continue monitoring electrolyte trend   DVT prophylaxis: Heparin Code Status: Full code Family Communication: No family at bedside. Disposition:   Status is: Inpatient  Dispo: The patient is from: Home              Anticipated d/c is to: Home              Anticipated d/c date is: 11/17/19              Patient currently improving and at this moment in the process of pain transition off insulin drip into sliding scale insulin and long-acting insulin.  Will also advance diet.  Patient will be transferred to med-surg bed.     Consultants:   None  Procedures:   See below for x-ray report  2D echo:  1. Left ventricular ejection fraction, by estimation, is 60 to 65%. The  left ventricle has normal function. The left ventricle has no regional  wall motion  abnormalities. Left ventricular diastolic parameters are  consistent with Grade I diastolic  dysfunction (impaired relaxation).  2. Right ventricular systolic function is normal. The right ventricular  size is normal. Tricuspid regurgitation signal is inadequate for assessing  PA pressure.  3. The mitral valve is degenerative. No evidence of mitral valve  regurgitation. No evidence of mitral stenosis.  4. The aortic valve is tricuspid. Aortic valve regurgitation is not  visualized. No aortic stenosis is present.  5. The inferior vena cava is dilated in size with >50% respiratory  variability, suggesting right atrial pressure of 8 mmHg.    Antimicrobials: None   Subjective: No fever, no chest pain, no palpitations.  Reports still feeling slightly sick in her stomach but otherwise no acute complaints.  Has tolerated it well IV fluids and insulin drip.  Objective: Vitals:   11/16/19 1200 11/16/19 1202 11/16/19 1358 11/16/19 1400  BP: (!) 141/56   (!) 122/56  Pulse: 80   69  Resp: 15     Temp:  98 F (36.7 C)    TempSrc:  Oral    SpO2: 98%  97% 98%  Weight:      Height:        Intake/Output Summary (Last 24 hours) at 11/16/2019 1514 Last data filed at 11/16/2019 1030 Gross per 24 hour  Intake 2775.54 ml  Output --  Net 2775.54 ml   Filed Weights   11/15/19 1114 11/16/19 0500  Weight: 95.7 kg 95.9 kg    Examination:  General exam: Appears calm and comfortable; reports no nausea, no vomiting, no chest pain.  Still feeling slightly sick in her stomach.  No palpitations.  Patient is afebrile. Respiratory system: Clear to auscultation. Respiratory effort normal. Cardiovascular system: S1 & S2 heard, RRR.  No JVD, no rubs, no gallops.  Positive systolic ejection murmur.  Gallops or clicks. No pedal edema. Gastrointestinal system: Abdomen is nondistended, soft and nontender. No organomegaly or masses felt. Normal bowel sounds heard. Central nervous system: Alert and  oriented. No focal neurological deficits. Extremities: Symmetric 5 x 5 power.  Trace edema bilaterally appreciated on her lower extremities. Skin: No rashes, lesions or ulcers Psychiatry: Judgement and insight appear normal. Mood & affect appropriate.     Data Reviewed: I have personally reviewed following labs and imaging studies  CBC: Recent Labs  Lab 11/15/19 1137  WBC 10.2  NEUTROABS 8.7*  HGB 10.6*  HCT 35.2*  MCV 102.3*  PLT 259    Basic Metabolic Panel: Recent Labs  Lab 11/15/19 1401 11/15/19 1401 11/15/19 1747 11/15/19 2220 11/16/19 0510 11/16/19 0758 11/16/19 1313  NA 136   < > 138 140 139 138 138  K 4.8   < > 4.6 4.8 4.5 4.5 4.4  CL 109   < > 110 112* 112* 111 111  CO2 15*   < > 18* 19* 18* 18* 19*  GLUCOSE 380*   < > 201* 142* 180* 193* 175*  BUN 46*   < > 47* 49* 45* 42* 36*  CREATININE 1.65*   < > 1.47* 1.45* 1.38* 1.32* 1.23*  CALCIUM 8.6*   < > 8.9 9.0 8.8* 8.8* 9.0  MG 1.8  --   --   --   --   --   --   PHOS 3.7  --   --   --   --   --   --    < > =  values in this interval not displayed.    GFR: Estimated Creatinine Clearance: 58.2 mL/min (A) (by C-G formula based on SCr of 1.23 mg/dL (H)).  Liver Function Tests: Recent Labs  Lab 11/15/19 1137  AST 24  ALT 32  ALKPHOS 63  BILITOT 1.1  PROT 6.8  ALBUMIN 3.9    CBG: Recent Labs  Lab 11/16/19 0549 11/16/19 0749 11/16/19 0958 11/16/19 1201 11/16/19 1414  GLUCAP 158* 155* 170* 145* 168*     Recent Results (from the past 240 hour(s))  Respiratory Panel by RT PCR (Flu A&B, Covid) - Nasopharyngeal Swab     Status: None   Collection Time: 11/15/19 12:25 PM   Specimen: Nasopharyngeal Swab  Result Value Ref Range Status   SARS Coronavirus 2 by RT PCR NEGATIVE NEGATIVE Final    Comment: (NOTE) SARS-CoV-2 target nucleic acids are NOT DETECTED. The SARS-CoV-2 RNA is generally detectable in upper respiratoy specimens during the acute phase of infection. The lowest concentration of  SARS-CoV-2 viral copies this assay can detect is 131 copies/mL. A negative result does not preclude SARS-Cov-2 infection and should not be used as the sole basis for treatment or other patient management decisions. A negative result may occur with  improper specimen collection/handling, submission of specimen other than nasopharyngeal swab, presence of viral mutation(s) within the areas targeted by this assay, and inadequate number of viral copies (<131 copies/mL). A negative result must be combined with clinical observations, patient history, and epidemiological information. The expected result is Negative. Fact Sheet for Patients:  PinkCheek.be Fact Sheet for Healthcare Providers:  GravelBags.it This test is not yet ap proved or cleared by the [place] FDA and  has been authorized for detection and/or diagnosis of SARS-CoV-2 by FDA under an Emergency Use Authorization (EUA). This EUA will remain  in effect (meaning this test can be used) for the duration of the COVID-19 declaration under Section 564(b)(1) of the Act, 21 U.S.C. section 360bbb-3(b)(1), unless the authorization is terminated or revoked sooner.    Influenza A by PCR NEGATIVE NEGATIVE Final   Influenza B by PCR NEGATIVE NEGATIVE Final    Comment: (NOTE) The Xpert Xpress SARS-CoV-2/FLU/RSV assay is intended as an aid in  the diagnosis of influenza from Nasopharyngeal swab specimens and  should not be used as a sole basis for treatment. Nasal washings and  aspirates are unacceptable for Xpert Xpress SARS-CoV-2/FLU/RSV  testing. Fact Sheet for Patients: PinkCheek.be Fact Sheet for Healthcare Providers: GravelBags.it This test is not yet approved or cleared by the [place] FDA and  has been authorized for detection and/or diagnosis of SARS-CoV-2 by  FDA under an Emergency Use Authorization (EUA).  This EUA will remain  in effect (meaning this test can be used) for the duration of the  Covid-19 declaration under Section 564(b)(1) of the Act, 21  U.S.C. section 360bbb-3(b)(1), unless the authorization is  terminated or revoked. Performed at [facility], [address] [place], [place] [address]   MRSA PCR Screening     Status: None   Collection Time: 11/15/19  6:05 PM   Specimen: Nasopharyngeal  Result Value Ref Range Status   MRSA by PCR NEGATIVE NEGATIVE Final    Comment:        The GeneXpert MRSA Assay (FDA approved for NASAL specimens only), is one component of a comprehensive MRSA colonization surveillance program. It is not intended to diagnose MRSA infection nor to guide or monitor treatment for MRSA infections. Performed at [facility], [place]  [address] [place], [place] [address]      Radiology Studies: [facility] Chest Port 1 View  Result Date: 11/15/2019 CLINICAL DATA:  Chest pain. EXAM: PORTABLE CHEST 1 VIEW COMPARISON:  02/21/2019 FINDINGS: Dorsal column stimulator again noted. Atherosclerotic calcification of the aortic arch. Heart size within normal limits for projection. Degenerative glenohumeral arthropathy bilaterally. No blunting of the costophrenic angles. The lungs appear clear. IMPRESSION: 1. No active cardiopulmonary disease is radiographically apparent. 2. Atherosclerotic calcification of the aortic arch. Electronically Signed   By: Van Clines M.D.   On: 11/15/2019 14:15   ECHOCARDIOGRAM COMPLETE  Result Date: 11/16/2019    ECHOCARDIOGRAM REPORT   Patient Name:   Jodi Ray Date of Exam: 11/16/2019 Medical Rec #:  [id number]         Height:       68.0 in Accession #:    [id number]        Weight:       211.4 lb Date of Birth:  1961-06-05         BSA:          2.093 m Patient Age:    32 years          BP:           116/52 mmHg Patient Gender: F                 HR:           78 bpm. Exam Location:  [facility] Procedure: 2D Echo, Cardiac Doppler and Color  Doppler Indications:    Chest Pain 786.50 / R07.9  History:        Patient has prior history of Echocardiogram examinations, most                 recent 07/17/2018. Arrythmias:LBBB; Risk Factors:Hypertension,                 Dyslipidemia and Diabetes. Acute renal failure superimposed on                 stage 3 chronic kidney disease,Class 1 obesity,OSA on CPAP.  Sonographer:    Alvino Chapel RCS Referring Phys: [place]  1. Left ventricular ejection fraction, by estimation, is 60 to 65%. The left ventricle has normal function. The left ventricle has no regional wall motion abnormalities. Left ventricular diastolic parameters are consistent with Grade I diastolic dysfunction (impaired relaxation).  2. Right ventricular systolic function is normal. The right ventricular size is normal. Tricuspid regurgitation signal is inadequate for assessing PA pressure.  3. The mitral valve is degenerative. No evidence of mitral valve regurgitation. No evidence of mitral stenosis.  4. The aortic valve is tricuspid. Aortic valve regurgitation is not visualized. No aortic stenosis is present.  5. The inferior vena cava is dilated in size with >50% respiratory variability, suggesting right atrial pressure of 8 mmHg. FINDINGS  Left Ventricle: Left ventricular ejection fraction, by estimation, is 60 to 65%. The left ventricle has normal function. The left ventricle has no regional wall motion abnormalities. The left ventricular internal cavity size was normal in size. There is  no left ventricular hypertrophy. Abnormal (paradoxical) septal motion, consistent with left bundle branch block. Left ventricular diastolic parameters are consistent with Grade I diastolic dysfunction (impaired relaxation). Right Ventricle: The right ventricular size is normal. No increase in right ventricular wall thickness. Right ventricular systolic function is normal. Tricuspid regurgitation signal is inadequate for assessing PA  pressure. Left Atrium: Left atrial  size was normal in size. Right Atrium: Right atrial size was normal in size. Pericardium: There is no evidence of pericardial effusion. Presence of pericardial fat pad. Mitral Valve: The mitral valve is degenerative in appearance. Mild to moderate mitral annular calcification. No evidence of mitral valve regurgitation. No evidence of mitral valve stenosis. Tricuspid Valve: The tricuspid valve is grossly normal. Tricuspid valve regurgitation is trivial. No evidence of tricuspid stenosis. Aortic Valve: The aortic valve is tricuspid. Aortic valve regurgitation is not visualized. No aortic stenosis is present. Pulmonic Valve: The pulmonic valve was grossly normal. Pulmonic valve regurgitation is not visualized. No evidence of pulmonic stenosis. Aorta: The aortic root is normal in size and structure. Venous: The inferior vena cava is dilated in size with greater than 50% respiratory variability, suggesting right atrial pressure of 8 mmHg. IAS/Shunts: The atrial septum is grossly normal.  LEFT VENTRICLE PLAX 2D LVIDd:         4.06 cm  Diastology LVIDs:         2.51 cm  LV e' lateral:   9.36 cm/s LV PW:         0.92 cm  LV E/e' lateral: 13.9 LV IVS:        1.01 cm  LV e' medial:    9.03 cm/s LVOT diam:     1.80 cm  LV E/e' medial:  14.4 LV SV:         79 LV SV Index:   38 LVOT Area:     2.54 cm  RIGHT VENTRICLE RV Mid diam:    2.79 cm RV S prime:     16.20 cm/s TAPSE (M-mode): 2.7 cm LEFT ATRIUM             Index LA diam:        3.60 cm 1.72 cm/m LA Vol (A2C):   55.4 ml 26.47 ml/m LA Vol (A4C):   49.8 ml 23.80 ml/m LA Biplane Vol: 54.6 ml 26.09 ml/m  AORTIC VALVE LVOT Vmax:   136.00 cm/s LVOT Vmean:  98.900 cm/s LVOT VTI:    0.309 m  AORTA Ao Root diam: 3.00 cm MITRAL VALVE MV Area (PHT): 2.78 cm     SHUNTS MV Decel Time: 273 msec     Systemic VTI:  0.31 m MV E velocity: 130.00 cm/s  Systemic Diam: 1.80 cm MV A velocity: 118.00 cm/s MV E/A ratio:  1.10 Eleonore Chiquito MD  Electronically signed by Eleonore Chiquito MD Signature Date/Time: 11/16/2019/12:55:03 PM    Final     Scheduled Meds: . aspirin EC  81 mg Oral Daily  . atorvastatin  80 mg Oral Daily  . Chlorhexidine Gluconate Cloth  6 each Topical Daily  . famotidine  20 mg Oral QHS  . heparin  5,000 Units Subcutaneous Q8H  . insulin aspart  0-15 Units Subcutaneous TID WC  . insulin aspart  0-5 Units Subcutaneous QHS  . insulin aspart  3 Units Subcutaneous TID WC  . insulin glargine  15 Units Subcutaneous BID  . levothyroxine  125 mcg Oral QAC breakfast  . pantoprazole  40 mg Oral BID  . topiramate  100 mg Oral QHS   Continuous Infusions: . sodium chloride Stopped (11/15/19 1524)     LOS: 1 day    Time spent: 35 minutes    Barton Dubois, MD [facility]   To contact the attending provider between 7A-7P or the covering provider during after hours 7P-7A, please log into the web site [email or web address] and access using  universal [place] password for that web site. If you do not have the password, please call the hospital operator.  11/16/2019, 3:14 PM

## 2019-11-16 NOTE — Progress Notes (Signed)
Patients CPAP has been pulled due to trouble wearing mask. Mask fits but patient has alergic reaction to material in mask. States she has to wear a cloth type mask???  She is pleasant and will use 2 liter nasal cannula tonight. Cannular is in room.

## 2019-11-16 NOTE — Progress Notes (Signed)
Patient up watching TV , off CPAP

## 2019-11-16 NOTE — Progress Notes (Signed)
*  PRELIMINARY RESULTS* Echocardiogram 2D Echocardiogram has been performed.  Jodi Ray 11/16/2019, 10:00 AM

## 2019-11-17 DIAGNOSIS — J301 Allergic rhinitis due to pollen: Secondary | ICD-10-CM | POA: Diagnosis not present

## 2019-11-17 DIAGNOSIS — I2510 Atherosclerotic heart disease of native coronary artery without angina pectoris: Secondary | ICD-10-CM

## 2019-11-17 LAB — BASIC METABOLIC PANEL
Anion gap: 7 (ref 5–15)
BUN: 27 mg/dL — ABNORMAL HIGH (ref 8–23)
CO2: 19 mmol/L — ABNORMAL LOW (ref 22–32)
Calcium: 8.8 mg/dL — ABNORMAL LOW (ref 8.9–10.3)
Chloride: 114 mmol/L — ABNORMAL HIGH (ref 98–111)
Creatinine, Ser: 1.02 mg/dL — ABNORMAL HIGH (ref 0.44–1.00)
GFR calc Af Amer: >60 mL/min (ref >60)
GFR calc non Af Amer: 59 mL/min — ABNORMAL LOW (ref >60)
Glucose, Bld: 219 mg/dL — ABNORMAL HIGH (ref 70–99)
Potassium: 4.5 mmol/L (ref 3.5–5.1)
Sodium: 140 mmol/L (ref 135–145)

## 2019-11-17 LAB — GLUCOSE, CAPILLARY
Glucose-Capillary: 155 mg/dL — ABNORMAL HIGH (ref 70–99)
Glucose-Capillary: 214 mg/dL — ABNORMAL HIGH (ref 70–99)
Glucose-Capillary: 103 mg/dL — ABNORMAL HIGH (ref 70–99)

## 2019-11-17 MED ORDER — INSULIN GLARGINE 100 UNIT/ML ~~LOC~~ SOLN: 12.0000 [IU] | Freq: Every day | SUBCUTANEOUS | 0 refills | Status: DC

## 2019-11-17 MED ORDER — FUROSEMIDE 20 MG PO TABS: 20.0000 mg | ORAL_TABLET | ORAL | Status: DC | PRN

## 2019-11-17 MED ORDER — FAMOTIDINE 20 MG PO TABS: 20.0000 mg | ORAL_TABLET | Freq: Every day | ORAL | 1 refills | Status: DC

## 2019-11-17 MED ORDER — METFORMIN HCL 500 MG PO TABS: 500.0000 mg | ORAL_TABLET | Freq: Two times a day (BID) | ORAL | Status: AC

## 2019-11-17 MED ORDER — INSULIN GLARGINE 100 UNIT/ML ~~LOC~~ SOLN
12.0000 [IU] | Freq: Once | SUBCUTANEOUS | Status: AC
  Administered 2019-11-17: 12 [IU] via SUBCUTANEOUS
  Filled 2019-11-17: qty 0.12

## 2019-11-17 NOTE — Progress Notes (Signed)
Patient provided with Lantus for home use post discharge per MD order.

## 2019-11-17 NOTE — Progress Notes (Signed)
Patient is to be discharged home and in stable condition. Patient's IV and telemetry removed, WNL. Patient given discharge instructions and verbalized understanding. Patient to be escorted out by staff via wheelchair upon arrival of transportation.  Celestia Khat, RN

## 2019-11-17 NOTE — Progress Notes (Signed)
Inpatient Diabetes Program Recommendations  AACE/ADA: New Consensus Statement on Inpatient Glycemic Control (2015)  Target Ranges:  Prepandial:   less than 140 mg/dL      Peak postprandial:   less than 180 mg/dL (1-2 hours)      Critically ill patients:  140 - 180 mg/dL   Lab Results  Component Value Date   GLUCAP 155 (H) 11/17/2019   HGBA1C 7.8 (H) 11/15/2019    Review of Glycemic Control  Diabetes history: DM1 Outpatient Diabetes medications: Insulin pump + Metformin 500 mg bid Current orders for Inpatient glycemic control: Lantus 15 units qd + Novolog 3 units tid meal coverage + Novolog sensitive correction tid +hs 0-5 units  Inpatient Diabetes Program Recommendations:   Spoke with patient via phone (DM coordinator @ [facility]). Patient shared that her insulin pump malfunctioned and checked it out with [employer] and unable to use it. Patient did not have back up prescriptions for insulin due to using the pump exclusively for the past 5 years. DM coordinator left message @ Dr. Almetta Ray office for callback to give list of insulin pump settings.  Reviewed need to have backup plan and Dr. Chalmers Ray wants patient to come in office to discuss plan after discharge.  Thank you, Jodi Ray. Jodi Gatling, RN, MSN, CDE  Diabetes Coordinator Inpatient Glycemic Control Team Team Pager [phone] (8am-5pm) 11/17/2019 10:25 AM

## 2019-11-17 NOTE — Discharge Summary (Signed)
Physician Discharge Summary  Jodi Ray OZH:[id number] DOB: September 23, 1960 DOA: 11/15/2019  PCP: Monico Blitz, MD  Admit date: 11/15/2019 Discharge date: 11/17/2019  Time spent: 35 minutes  Recommendations for Outpatient Follow-up:  1. Repeat basic metabolic panel to follow electrolytes and renal function 2. Continue to have close monitoring of patient's CBGs with further adjustment to hypoglycemic regimen as needed.   Discharge Diagnoses:  Principal Problem:   Diabetic ketoacidosis without coma associated with type 1 diabetes mellitus (HCC) Active Problems:   OSA on CPAP   Mixed hyperlipidemia   Primary hypothyroidism   Benign hypertension   Gastritis and gastroduodenitis   Atypical chest pain   Class 1 obesity   Acute renal failure superimposed on stage 3 chronic kidney disease ([place])   Discharge Condition: Stable and improved.  Patient discharged home with instruction to follow-up with PCP and endocrinologist after discharge.  CODE STATUS: Full code.   Diet recommendation: Modified carbohydrate/heart healthy.  Filed Weights   11/15/19 1114 11/16/19 0500 11/17/19 0500  Weight: 95.7 kg 95.9 kg 96.1 kg    History of present illness:  59 y.o.femalewith medical history significant oftype 1 diabetes mellitus (insulin-dependent and using insulin pump),history of coronary artery disease s/p drug-eluting stent to proximal LAD, GERD,hypertension, hyperlipidemia, hypothyroidism, class I obesity, obstructive sleep apnea on CPAP, migraines and chronic kidney disease a stage IIIb; who presented to the hospital secondary to chest discomfort and high blood sugars. Patient expressed pain in her chest woke her up this morning, sudden onset, localized in the middle of the chest without radiation, 6-7 out of 10 in intensity, no aggravating factors, associated with nausea and vomiting; patient reports gradual but steady improvement in symptoms over 1-2 hours. She reported using aspirin  and nitroglycerin without improvement after taking it immediately. Patient noticed that her insulin pump was disconnected and that could potentially be the reason why her sugar was running so high. She reports prior history of DKA's but none in the recent years.   Of note,patient denies shortness of breath, dysuria, hematuria, fever, chills, hematemesis, melena, hematochezia focal weakness or any other complaints.  ED Course:Blood work demonstrating acute DKA; chest x-ray without acute abnormalities, negative troponin and no EKG changes for acute ischemia. IV fluid bolus and insulin drip has been started; TRH consulted to further assist with evaluation and management of her DKA.  Hospital Course:  1-DKA, type 1 (HCC) -Stable bicarb, close gap and normal BHA levels at discharge.   -Transition off insulin drip without problems and discharge home on Lantus 26 units total in the next 24 hours prior to resume previous home hypoglycemic regimen through insulin pump. -Outpatient follow-up with endocrinologist in the next 7 days. -A1c 7.8 -Modified carbohydrate diet -Decompensation and DKA process in the setting of insulin pump malfunction; patient will receive a new [employer] insulin pump in the morning of 11/18/2019..  2-class I obesity/OSA on CPAP -Continue CPAP nightly. -Body mass index is 32.21 kg/m. -Low calorie diet, portion control and increase physical activity discussed with patient.  3-Mixed hyperlipidemia -Continue statins. -Heart healthy diet encouraged.  4-acute renal failure superimposed on stage III chronic kidney disease: -Stage III at baseline -Continue minimizing nephrotoxic agents and avoiding hypotension -Patient has been encouraged to maintain adequate hydration -Repeat basic metabolic panel at follow-up visit to reassess renal function and stability.  5-Primary hypothyroidism -Continue Synthroid. -TSH within normal limits.  6-Benign hypertension -Overall  stable and well-controlled -Safe to resume home antihypertensive regimen. -Low-sodium diet has been encouraged.  7-Gastritis and  gastroduodenitis -Continue PPI and add Pepcid nightly for better symptomatic management  8-Chest pain: Atypical presentation.  Patient with underlying history of coronary artery disease and MI. -Negative troponin, no EKG or telemetry changes -2D echo reassuring with no wall motion abnormalities and preserved ejection fraction. -Continue cardiac rehabilitation and follow-up with cardiology service as an outpatient.  9-chronic diastolic heart failure -Grade 1 diastolic dysfunction appreciated on 2D echo -Continue low-sodium diet, daily weights and as needed use of Lasix. -Continue patient follow-up with cardiology service.  10-hyperkalemia -resolved  -in the setting of DKA and AKI on CKD. -Repeat basic metabolic panel follow-up visit to reassess electrolytes stability.  11-chronic headache/migraine -Continue Emgality monthly injection and nightly Topamax. -Continue outpatient follow-up with neurology service.  Procedures:  See below for x-ray reports  2D echo 1. Left ventricular ejection fraction, by estimation, is 60 to 65%. The  left ventricle has normal function. The left ventricle has no regional  wall motion abnormalities. Left ventricular diastolic parameters are  consistent with Grade I diastolic  dysfunction (impaired relaxation).  2. Right ventricular systolic function is normal. The right ventricular  size is normal. Tricuspid regurgitation signal is inadequate for assessing  PA pressure.  3. The mitral valve is degenerative. No evidence of mitral valve  regurgitation. No evidence of mitral stenosis.  4. The aortic valve is tricuspid. Aortic valve regurgitation is not  visualized. No aortic stenosis is present.  5. The inferior vena cava is dilated in size with >50% respiratory  variability, suggesting right atrial pressure of 8  mmHg.   Consultations:  None  Discharge Exam: Vitals:   11/17/19 1305 11/17/19 1400  BP: (!) 154/71 (!) 125/56  Pulse: 80 73  Resp: 18 13  Temp:    SpO2: 98% 95%    General: Afebrile, no chest pain, no nausea, no vomiting.  Still reporting some mild indigestion feeling otherwise stable and ready to go home. Cardiovascular: S1 and S2, no rubs, no gallops, no JVD. Respiratory: Clear to auscultation bilaterally; good oxygen saturation on room air and having normal respiratory effort. Abdomen: Soft, nontender, nondistended, positive bowel sounds Extremities: No cyanosis or clubbing.  Discharge Instructions   Discharge Instructions    (HEART FAILURE PATIENTS) Call MD:  Anytime you have any of the following symptoms: 1) 3 pound weight gain in 24 hours or 5 pounds in 1 week 2) shortness of breath, with or without a dry hacking cough 3) swelling in the hands, feet or stomach 4) if you have to sleep on extra pillows at night in order to breathe.   Complete by: As directed    Diet - low sodium heart healthy   Complete by: As directed    Discharge instructions   Complete by: As directed    Follow modified carb diet Take meds as prescribed  Maintain adequate hydration Please check weight on daily basis  Follow up with PCP in 10 days.     Allergies as of 11/17/2019      Reactions   Fentanyl Nausea And Vomiting, Other (See Comments)   Nausea, vomiting, loss of consciousness, requiring reversal      Medication List    TAKE these medications   aspirin EC 81 MG tablet Take 81 mg by mouth daily.   atorvastatin 80 MG tablet Commonly known as: LIPITOR TAKE ONE TABLET BY MOUTH DAILY AT 6 PM   Blink Tears 0.25 % Soln Generic drug: Polyethylene Glycol 400 Apply 1 drop to eye daily as needed (for eye irritation/dryness).  carvedilol 3.125 MG tablet Commonly known as: COREG Take 1 tablet (3.125 mg) 1 hour before cardiac rehab   Centrum Silver 50+Women Tabs Take 1 tablet by  mouth daily.   CoQ-10 100 MG Caps Take 1 capsule by mouth daily.   Emgality 120 MG/ML Soaj Generic drug: Galcanezumab-gnlm Inject 120 mg into the skin every 30 (thirty) days.   EPINEPHrine 0.3 mg/0.3 mL Soaj injection Commonly known as: EPI-PEN Inject 0.3 mg into the muscle as needed.   famotidine 20 MG tablet Commonly known as: PEPCID Take 1 tablet (20 mg total) by mouth at bedtime.   Fish Oil 1000 MG Caps Take 1,000 mg by mouth daily.   furosemide 20 MG tablet Commonly known as: LASIX Take 1 tablet (20 mg total) by mouth as needed for fluid. What changed: reasons to take this   insulin glargine 100 UNIT/ML injection Commonly known as: LANTUS Inject 0.12 mLs (12 Units total) into the skin at bedtime for 1 dose.   insulin pump Soln Inject into the skin continuous.   levothyroxine 125 MCG tablet Commonly known as: SYNTHROID Take 125 mcg by mouth daily before breakfast.   lisinopril 10 MG tablet Commonly known as: ZESTRIL Take 10 mg by mouth daily.   metFORMIN 500 MG tablet Commonly known as: GLUCOPHAGE Take 1 tablet (500 mg total) by mouth 2 (two) times daily with a meal. Start taking on: November 19, 2019 What changed:   additional instructions  These instructions start on November 19, 2019. If you are unsure what to do until then, ask your doctor or other care provider.   nitroGLYCERIN 0.4 MG SL tablet Commonly known as: NITROSTAT Place 1 tablet (0.4 mg total) under the tongue every 5 (five) minutes as needed for chest pain.   NovoLOG 100 UNIT/ML injection Generic drug: insulin aspart Inject into the skin continuous.   OXYGEN Inhale into the lungs. As needed   pantoprazole 40 MG tablet Commonly known as: Protonix Take 1 tablet (40 mg total) by mouth 2 (two) times daily before a meal.   topiramate 100 MG tablet Commonly known as: TOPAMAX Take 3 tablets (300 mg total) by mouth at bedtime. 3 TABLETS AT BEDTIME What changed:   how much to  take  additional instructions   Vitamin D-3 125 MCG (5000 UT) Tabs Take 5,000 Units by mouth daily.      Allergies  Allergen Reactions  . Fentanyl Nausea And Vomiting and Other (See Comments)    Nausea, vomiting, loss of consciousness, requiring reversal   Follow-up Information    Monico Blitz, MD. Schedule an appointment as soon as possible for a visit in 10 day(s).   Specialty: Internal Medicine Contact information: [place] [address] [phone]        Herminio Commons, MD .   Specialty: Cardiology Contact information: [place] [address] [phone]           The results of significant diagnostics from this hospitalization (including imaging, microbiology, ancillary and laboratory) are listed below for reference.    Significant Diagnostic Studies: DG Chest Port 1 View  Result Date: 11/15/2019 CLINICAL DATA:  Chest pain. EXAM: PORTABLE CHEST 1 VIEW COMPARISON:  02/21/2019 FINDINGS: Dorsal column stimulator again noted. Atherosclerotic calcification of the aortic arch. Heart size within normal limits for projection. Degenerative glenohumeral arthropathy bilaterally. No blunting of the costophrenic angles. The lungs appear clear. IMPRESSION: 1. No active cardiopulmonary disease is radiographically apparent. 2. Atherosclerotic calcification of the aortic  arch. Electronically Signed   By: Van Clines M.D.   On: 11/15/2019 14:15   ECHOCARDIOGRAM COMPLETE  Result Date: 11/16/2019    ECHOCARDIOGRAM REPORT   Patient Name:   Jodi Ray Date of Exam: 11/16/2019 Medical Rec #:  [id number]         Height:       68.0 in Accession #:    [id number]        Weight:       211.4 lb Date of Birth:  04-03-1961         BSA:          2.093 m Patient Age:    59 years          BP:           116/52 mmHg Patient Gender: F                 HR:           78 bpm. Exam Location:  [facility] Procedure: 2D Echo, Cardiac Doppler and Color Doppler  Indications:    Chest Pain 786.50 / R07.9  History:        Patient has prior history of Echocardiogram examinations, most                 recent 07/17/2018. Arrythmias:LBBB; Risk Factors:Hypertension,                 Dyslipidemia and Diabetes. Acute renal failure superimposed on                 stage 3 chronic kidney disease,Class 1 obesity,OSA on CPAP.  Sonographer:    Alvino Chapel RCS Referring Phys: [place]  1. Left ventricular ejection fraction, by estimation, is 60 to 65%. The left ventricle has normal function. The left ventricle has no regional wall motion abnormalities. Left ventricular diastolic parameters are consistent with Grade I diastolic dysfunction (impaired relaxation).  2. Right ventricular systolic function is normal. The right ventricular size is normal. Tricuspid regurgitation signal is inadequate for assessing PA pressure.  3. The mitral valve is degenerative. No evidence of mitral valve regurgitation. No evidence of mitral stenosis.  4. The aortic valve is tricuspid. Aortic valve regurgitation is not visualized. No aortic stenosis is present.  5. The inferior vena cava is dilated in size with >50% respiratory variability, suggesting right atrial pressure of 8 mmHg. FINDINGS  Left Ventricle: Left ventricular ejection fraction, by estimation, is 60 to 65%. The left ventricle has normal function. The left ventricle has no regional wall motion abnormalities. The left ventricular internal cavity size was normal in size. There is  no left ventricular hypertrophy. Abnormal (paradoxical) septal motion, consistent with left bundle branch block. Left ventricular diastolic parameters are consistent with Grade I diastolic dysfunction (impaired relaxation). Right Ventricle: The right ventricular size is normal. No increase in right ventricular wall thickness. Right ventricular systolic function is normal. Tricuspid regurgitation signal is inadequate for assessing PA pressure. Left  Atrium: Left atrial size was normal in size. Right Atrium: Right atrial size was normal in size. Pericardium: There is no evidence of pericardial effusion. Presence of pericardial fat pad. Mitral Valve: The mitral valve is degenerative in appearance. Mild to moderate mitral annular calcification. No evidence of mitral valve regurgitation. No evidence of mitral valve stenosis. Tricuspid Valve: The tricuspid valve is grossly normal. Tricuspid valve regurgitation is trivial. No evidence of tricuspid stenosis. Aortic Valve: The aortic valve is tricuspid. Aortic valve  regurgitation is not visualized. No aortic stenosis is present. Pulmonic Valve: The pulmonic valve was grossly normal. Pulmonic valve regurgitation is not visualized. No evidence of pulmonic stenosis. Aorta: The aortic root is normal in size and structure. Venous: The inferior vena cava is dilated in size with greater than 50% respiratory variability, suggesting right atrial pressure of 8 mmHg. IAS/Shunts: The atrial septum is grossly normal.  LEFT VENTRICLE PLAX 2D LVIDd:         4.06 cm  Diastology LVIDs:         2.51 cm  LV e' lateral:   9.36 cm/s LV PW:         0.92 cm  LV E/e' lateral: 13.9 LV IVS:        1.01 cm  LV e' medial:    9.03 cm/s LVOT diam:     1.80 cm  LV E/e' medial:  14.4 LV SV:         79 LV SV Index:   38 LVOT Area:     2.54 cm  RIGHT VENTRICLE RV Mid diam:    2.79 cm RV S prime:     16.20 cm/s TAPSE (M-mode): 2.7 cm LEFT ATRIUM             Index LA diam:        3.60 cm 1.72 cm/m LA Vol (A2C):   55.4 ml 26.47 ml/m LA Vol (A4C):   49.8 ml 23.80 ml/m LA Biplane Vol: 54.6 ml 26.09 ml/m  AORTIC VALVE LVOT Vmax:   136.00 cm/s LVOT Vmean:  98.900 cm/s LVOT VTI:    0.309 m  AORTA Ao Root diam: 3.00 cm MITRAL VALVE MV Area (PHT): 2.78 cm     SHUNTS MV Decel Time: 273 msec     Systemic VTI:  0.31 m MV E velocity: 130.00 cm/s  Systemic Diam: 1.80 cm MV A velocity: 118.00 cm/s MV E/A ratio:  1.10 Eleonore Chiquito MD Electronically signed by  Eleonore Chiquito MD Signature Date/Time: 11/16/2019/12:55:03 PM    Final     Microbiology: Recent Results (from the past 240 hour(s))  Respiratory Panel by RT PCR (Flu A&B, Covid) - Nasopharyngeal Swab     Status: None   Collection Time: 11/15/19 12:25 PM   Specimen: Nasopharyngeal Swab  Result Value Ref Range Status   SARS Coronavirus 2 by RT PCR NEGATIVE NEGATIVE Final    Comment: (NOTE) SARS-CoV-2 target nucleic acids are NOT DETECTED. The SARS-CoV-2 RNA is generally detectable in upper respiratoy specimens during the acute phase of infection. The lowest concentration of SARS-CoV-2 viral copies this assay can detect is 131 copies/mL. A negative result does not preclude SARS-Cov-2 infection and should not be used as the sole basis for treatment or other patient management decisions. A negative result may occur with  improper specimen collection/handling, submission of specimen other than nasopharyngeal swab, presence of viral mutation(s) within the areas targeted by this assay, and inadequate number of viral copies (<131 copies/mL). A negative result must be combined with clinical observations, patient history, and epidemiological information. The expected result is Negative. Fact Sheet for Patients:  PinkCheek.be Fact Sheet for Healthcare Providers:  GravelBags.it This test is not yet ap proved or cleared by the [place] FDA and  has been authorized for detection and/or diagnosis of SARS-CoV-2 by FDA under an Emergency Use Authorization (EUA). This EUA will remain  in effect (meaning this test can be used) for the duration of the COVID-19 declaration under Section 564(b)(1) of the  Act, 21 U.S.C. section 360bbb-3(b)(1), unless the authorization is terminated or revoked sooner.    Influenza A by PCR NEGATIVE NEGATIVE Final   Influenza B by PCR NEGATIVE NEGATIVE Final    Comment: (NOTE) The Xpert Xpress  SARS-CoV-2/FLU/RSV assay is intended as an aid in  the diagnosis of influenza from Nasopharyngeal swab specimens and  should not be used as a sole basis for treatment. Nasal washings and  aspirates are unacceptable for Xpert Xpress SARS-CoV-2/FLU/RSV  testing. Fact Sheet for Patients: PinkCheek.be Fact Sheet for Healthcare Providers: GravelBags.it This test is not yet approved or cleared by the [place] FDA and  has been authorized for detection and/or diagnosis of SARS-CoV-2 by  FDA under an Emergency Use Authorization (EUA). This EUA will remain  in effect (meaning this test can be used) for the duration of the  Covid-19 declaration under Section 564(b)(1) of the Act, 21  U.S.C. section 360bbb-3(b)(1), unless the authorization is  terminated or revoked. Performed at [facility], [address], [place], [place] [address]   MRSA PCR Screening     Status: None   Collection Time: 11/15/19  6:05 PM   Specimen: Nasopharyngeal  Result Value Ref Range Status   MRSA by PCR NEGATIVE NEGATIVE Final    Comment:        The GeneXpert MRSA Assay (FDA approved for NASAL specimens only), is one component of a comprehensive MRSA colonization surveillance program. It is not intended to diagnose MRSA infection nor to guide or monitor treatment for MRSA infections. Performed at [facility], [address], [place], [place] [address]      Labs: Basic Metabolic Panel: Recent Labs  Lab 11/15/19 1401 11/15/19 1747 11/15/19 2220 11/16/19 0510 11/16/19 0758 11/16/19 1313 11/17/19 0459  NA 136   < > 140 139 138 138 140  K 4.8   < > 4.8 4.5 4.5 4.4 4.5  CL 109   < > 112* 112* 111 111 114*  CO2 15*   < > 19* 18* 18* 19* 19*  GLUCOSE 380*   < > 142* 180* 193* 175* 219*  BUN 46*   < > 49* 45* 42* 36* 27*  CREATININE 1.65*   < > 1.45* 1.38* 1.32* 1.23* 1.02*  CALCIUM 8.6*   < > 9.0 8.8* 8.8* 9.0 8.8*  MG 1.8  --   --   --   --    --   --   PHOS 3.7  --   --   --   --   --   --    < > = values in this interval not displayed.   Liver Function Tests: Recent Labs  Lab 11/15/19 1137  AST 24  ALT 32  ALKPHOS 63  BILITOT 1.1  PROT 6.8  ALBUMIN 3.9   Recent Labs  Lab 11/15/19 1137  LIPASE 24   CBC: Recent Labs  Lab 11/15/19 1137  WBC 10.2  NEUTROABS 8.7*  HGB 10.6*  HCT 35.2*  MCV 102.3*  PLT 167   CBG: Recent Labs  Lab 11/16/19 1601 11/16/19 1743 11/16/19 2153 11/17/19 0749 11/17/19 1127  GLUCAP 175* 178* 162* 155* 214*    Signed:  Barton Dubois MD.  [facility] 11/17/2019, 3:07 PM

## 2019-11-17 NOTE — Progress Notes (Addendum)
Inpatient Diabetes Program Recommendations  AACE/ADA: New Consensus Statement on Inpatient Glycemic Control (2015)  Target Ranges:  Prepandial:   less than 140 mg/dL      Peak postprandial:   less than 180 mg/dL (1-2 hours)      Critically ill patients:  140 - 180 mg/dL   Lab Results  Component Value Date   GLUCAP 214 (H) 11/17/2019   HGBA1C 7.8 (H) 11/15/2019   Received phone call back from Dr. Almetta Lovely office with insulin pump settings:  Basal rates: Midnight -0.975 units/hr 4 am        1.1 units/hr 8 am         1.15 units/hr 11 am        1.0 units/hr 5:30 pm      1.1 units/hr 10 pm          1.9 units/hr  Total basal insulin over 24 hrs. = 25.1  Carbohydrate per Insulin ratio   20 gms carbohydrate to 1 unit  Sensitivity     1 unit insulin drops CBG approximately 60   Target range =  120  Secure chat to Dr. Dyann Kief to update settings available.  Thank you, Nani Gasser. Girl Schissler, RN, MSN, CDE  Diabetes Coordinator Inpatient Glycemic Control Team Team Pager [phone] (8am-5pm) 11/17/2019 12:23 PM

## 2019-11-18 DIAGNOSIS — E785 Hyperlipidemia, unspecified: Secondary | ICD-10-CM | POA: Diagnosis not present

## 2019-11-18 DIAGNOSIS — E109 Type 1 diabetes mellitus without complications: Secondary | ICD-10-CM | POA: Diagnosis not present

## 2019-11-18 DIAGNOSIS — Z9641 Presence of insulin pump (external) (internal): Secondary | ICD-10-CM | POA: Diagnosis not present

## 2019-11-18 DIAGNOSIS — E039 Hypothyroidism, unspecified: Secondary | ICD-10-CM | POA: Diagnosis not present

## 2019-11-18 DIAGNOSIS — I10 Essential (primary) hypertension: Secondary | ICD-10-CM | POA: Diagnosis not present

## 2019-11-19 DIAGNOSIS — J301 Allergic rhinitis due to pollen: Secondary | ICD-10-CM | POA: Diagnosis not present

## 2019-11-21 DIAGNOSIS — J301 Allergic rhinitis due to pollen: Secondary | ICD-10-CM | POA: Diagnosis not present

## 2019-11-24 DIAGNOSIS — J301 Allergic rhinitis due to pollen: Secondary | ICD-10-CM | POA: Diagnosis not present

## 2019-11-26 DIAGNOSIS — J301 Allergic rhinitis due to pollen: Secondary | ICD-10-CM | POA: Diagnosis not present

## 2019-11-28 DIAGNOSIS — J301 Allergic rhinitis due to pollen: Secondary | ICD-10-CM | POA: Diagnosis not present

## 2019-12-01 DIAGNOSIS — J301 Allergic rhinitis due to pollen: Secondary | ICD-10-CM | POA: Diagnosis not present

## 2019-12-02 DIAGNOSIS — E109 Type 1 diabetes mellitus without complications: Secondary | ICD-10-CM | POA: Diagnosis not present

## 2019-12-03 DIAGNOSIS — J301 Allergic rhinitis due to pollen: Secondary | ICD-10-CM | POA: Diagnosis not present

## 2019-12-05 DIAGNOSIS — J301 Allergic rhinitis due to pollen: Secondary | ICD-10-CM | POA: Diagnosis not present

## 2019-12-08 DIAGNOSIS — J301 Allergic rhinitis due to pollen: Secondary | ICD-10-CM | POA: Diagnosis not present

## 2019-12-10 DIAGNOSIS — J301 Allergic rhinitis due to pollen: Secondary | ICD-10-CM | POA: Diagnosis not present

## 2019-12-12 DIAGNOSIS — J301 Allergic rhinitis due to pollen: Secondary | ICD-10-CM | POA: Diagnosis not present

## 2019-12-15 DIAGNOSIS — J301 Allergic rhinitis due to pollen: Secondary | ICD-10-CM | POA: Diagnosis not present

## 2019-12-17 DIAGNOSIS — J301 Allergic rhinitis due to pollen: Secondary | ICD-10-CM | POA: Diagnosis not present

## 2019-12-18 DIAGNOSIS — Z6833 Body mass index (BMI) 33.0-33.9, adult: Secondary | ICD-10-CM | POA: Diagnosis not present

## 2019-12-18 DIAGNOSIS — Z0000 Encounter for general adult medical examination without abnormal findings: Secondary | ICD-10-CM | POA: Diagnosis not present

## 2019-12-18 DIAGNOSIS — Z1331 Encounter for screening for depression: Secondary | ICD-10-CM | POA: Diagnosis not present

## 2019-12-18 DIAGNOSIS — E039 Hypothyroidism, unspecified: Secondary | ICD-10-CM | POA: Diagnosis not present

## 2019-12-18 DIAGNOSIS — Z1339 Encounter for screening examination for other mental health and behavioral disorders: Secondary | ICD-10-CM | POA: Diagnosis not present

## 2019-12-18 DIAGNOSIS — E1165 Type 2 diabetes mellitus with hyperglycemia: Secondary | ICD-10-CM | POA: Diagnosis not present

## 2019-12-18 DIAGNOSIS — Z7189 Other specified counseling: Secondary | ICD-10-CM | POA: Diagnosis not present

## 2019-12-18 DIAGNOSIS — E7800 Pure hypercholesterolemia, unspecified: Secondary | ICD-10-CM | POA: Diagnosis not present

## 2019-12-18 DIAGNOSIS — Z299 Encounter for prophylactic measures, unspecified: Secondary | ICD-10-CM | POA: Diagnosis not present

## 2019-12-18 DIAGNOSIS — Z1211 Encounter for screening for malignant neoplasm of colon: Secondary | ICD-10-CM | POA: Diagnosis not present

## 2019-12-22 DIAGNOSIS — J301 Allergic rhinitis due to pollen: Secondary | ICD-10-CM | POA: Diagnosis not present

## 2019-12-24 DIAGNOSIS — J301 Allergic rhinitis due to pollen: Secondary | ICD-10-CM | POA: Diagnosis not present

## 2019-12-26 DIAGNOSIS — J301 Allergic rhinitis due to pollen: Secondary | ICD-10-CM | POA: Diagnosis not present

## 2019-12-28 DIAGNOSIS — I10 Essential (primary) hypertension: Secondary | ICD-10-CM | POA: Diagnosis not present

## 2019-12-28 DIAGNOSIS — E119 Type 2 diabetes mellitus without complications: Secondary | ICD-10-CM | POA: Diagnosis not present

## 2019-12-28 DIAGNOSIS — M159 Polyosteoarthritis, unspecified: Secondary | ICD-10-CM | POA: Diagnosis not present

## 2019-12-29 DIAGNOSIS — J301 Allergic rhinitis due to pollen: Secondary | ICD-10-CM | POA: Diagnosis not present

## 2019-12-31 DIAGNOSIS — J301 Allergic rhinitis due to pollen: Secondary | ICD-10-CM | POA: Diagnosis not present

## 2020-01-02 DIAGNOSIS — J301 Allergic rhinitis due to pollen: Secondary | ICD-10-CM | POA: Diagnosis not present

## 2020-01-05 DIAGNOSIS — J301 Allergic rhinitis due to pollen: Secondary | ICD-10-CM | POA: Diagnosis not present

## 2020-01-07 DIAGNOSIS — J301 Allergic rhinitis due to pollen: Secondary | ICD-10-CM | POA: Diagnosis not present

## 2020-01-09 DIAGNOSIS — J301 Allergic rhinitis due to pollen: Secondary | ICD-10-CM | POA: Diagnosis not present

## 2020-01-12 DIAGNOSIS — J301 Allergic rhinitis due to pollen: Secondary | ICD-10-CM | POA: Diagnosis not present

## 2020-01-14 DIAGNOSIS — J301 Allergic rhinitis due to pollen: Secondary | ICD-10-CM | POA: Diagnosis not present

## 2020-01-16 DIAGNOSIS — J301 Allergic rhinitis due to pollen: Secondary | ICD-10-CM | POA: Diagnosis not present

## 2020-01-19 DIAGNOSIS — J301 Allergic rhinitis due to pollen: Secondary | ICD-10-CM | POA: Diagnosis not present

## 2020-01-21 DIAGNOSIS — J301 Allergic rhinitis due to pollen: Secondary | ICD-10-CM | POA: Diagnosis not present

## 2020-01-23 DIAGNOSIS — J301 Allergic rhinitis due to pollen: Secondary | ICD-10-CM | POA: Diagnosis not present

## 2020-01-26 DIAGNOSIS — J301 Allergic rhinitis due to pollen: Secondary | ICD-10-CM | POA: Diagnosis not present

## 2020-01-28 DIAGNOSIS — J301 Allergic rhinitis due to pollen: Secondary | ICD-10-CM | POA: Diagnosis not present

## 2020-01-28 DIAGNOSIS — E119 Type 2 diabetes mellitus without complications: Secondary | ICD-10-CM | POA: Diagnosis not present

## 2020-01-28 DIAGNOSIS — M159 Polyosteoarthritis, unspecified: Secondary | ICD-10-CM | POA: Diagnosis not present

## 2020-01-28 DIAGNOSIS — I10 Essential (primary) hypertension: Secondary | ICD-10-CM | POA: Diagnosis not present

## 2020-01-30 DIAGNOSIS — J301 Allergic rhinitis due to pollen: Secondary | ICD-10-CM | POA: Diagnosis not present

## 2020-02-02 DIAGNOSIS — J301 Allergic rhinitis due to pollen: Secondary | ICD-10-CM | POA: Diagnosis not present

## 2020-02-04 ENCOUNTER — Other Ambulatory Visit (HOSPITAL_COMMUNITY)
Admission: RE | Admit: 2020-02-04 | Discharge: 2020-02-04 | Disposition: A | Discharge status: Home / Self Care | Payer: Medicare Other | Source: Ambulatory Visit | Attending: Nephrology | Admitting: Nephrology

## 2020-02-04 DIAGNOSIS — D631 Anemia in chronic kidney disease: Secondary | ICD-10-CM | POA: Diagnosis present

## 2020-02-04 DIAGNOSIS — J301 Allergic rhinitis due to pollen: Secondary | ICD-10-CM | POA: Diagnosis not present

## 2020-02-04 DIAGNOSIS — Z79899 Other long term (current) drug therapy: Secondary | ICD-10-CM | POA: Diagnosis present

## 2020-02-04 DIAGNOSIS — R809 Proteinuria, unspecified: Secondary | ICD-10-CM | POA: Insufficient documentation

## 2020-02-04 DIAGNOSIS — N184 Chronic kidney disease, stage 4 (severe): Secondary | ICD-10-CM | POA: Diagnosis present

## 2020-02-04 DIAGNOSIS — E559 Vitamin D deficiency, unspecified: Secondary | ICD-10-CM | POA: Diagnosis present

## 2020-02-04 LAB — VITAMIN D 25 HYDROXY (VIT D DEFICIENCY, FRACTURES): Vit D, 25-Hydroxy: 37.34 ng/mL (ref 30–100)

## 2020-02-04 LAB — CBC WITH DIFFERENTIAL/PLATELET
Abs Immature Granulocytes: 0.01 10*3/uL (ref 0.00–0.07)
Basophils Absolute: 0.1 10*3/uL (ref 0.0–0.1)
Basophils Relative: 1 %
Eosinophils Absolute: 0.2 10*3/uL (ref 0.0–0.5)
Eosinophils Relative: 4 %
HCT: 33.2 % — ABNORMAL LOW (ref 36.0–46.0)
Hemoglobin: 10.4 g/dL — ABNORMAL LOW (ref 12.0–15.0)
Immature Granulocytes: 0 %
Lymphocytes Relative: 29 %
Lymphs Abs: 1.5 10*3/uL (ref 0.7–4.0)
MCH: 29.9 pg (ref 26.0–34.0)
MCHC: 31.3 g/dL (ref 30.0–36.0)
MCV: 95.4 fL (ref 80.0–100.0)
Monocytes Absolute: 0.4 10*3/uL (ref 0.1–1.0)
Monocytes Relative: 7 %
Neutro Abs: 3 10*3/uL (ref 1.7–7.7)
Neutrophils Relative %: 59 %
Platelets: 191 10*3/uL (ref 150–400)
RBC: 3.48 MIL/uL — ABNORMAL LOW (ref 3.87–5.11)
RDW: 13.2 % (ref 11.5–15.5)
WBC: 5.2 10*3/uL (ref 4.0–10.5)
nRBC: 0 % (ref 0.0–0.2)

## 2020-02-04 LAB — PROTEIN / CREATININE RATIO, URINE
Creatinine, Urine: 66.32 mg/dL
Protein Creatinine Ratio: 0.14 mg/mg{Cre} (ref 0.00–0.15)
Total Protein, Urine: 9 mg/dL

## 2020-02-04 LAB — RENAL FUNCTION PANEL
Albumin: 3.7 g/dL (ref 3.5–5.0)
Anion gap: 8 (ref 5–15)
BUN: 22 mg/dL (ref 8–23)
CO2: 25 mmol/L (ref 22–32)
Calcium: 9.1 mg/dL (ref 8.9–10.3)
Chloride: 105 mmol/L (ref 98–111)
Creatinine, Ser: 0.95 mg/dL (ref 0.44–1.00)
GFR calc Af Amer: >60 mL/min (ref >60)
GFR calc non Af Amer: >60 mL/min (ref >60)
Glucose, Bld: 107 mg/dL — ABNORMAL HIGH (ref 70–99)
Phosphorus: 3.8 mg/dL (ref 2.5–4.6)
Potassium: 5 mmol/L (ref 3.5–5.1)
Sodium: 138 mmol/L (ref 135–145)

## 2020-02-04 LAB — IRON AND TIBC
Iron: 63 ug/dL (ref 28–170)
Saturation Ratios: 20 % (ref 10.4–31.8)
TIBC: 319 ug/dL (ref 250–450)
UIBC: 256 ug/dL

## 2020-02-05 LAB — PTH, INTACT AND CALCIUM
Calcium, Total (PTH): 9.3 mg/dL (ref 8.7–10.3)
PTH: 44 pg/mL (ref 15–65)

## 2020-02-06 DIAGNOSIS — J301 Allergic rhinitis due to pollen: Secondary | ICD-10-CM | POA: Diagnosis not present

## 2020-02-13 DIAGNOSIS — J301 Allergic rhinitis due to pollen: Secondary | ICD-10-CM | POA: Diagnosis not present

## 2020-02-16 DIAGNOSIS — J301 Allergic rhinitis due to pollen: Secondary | ICD-10-CM | POA: Diagnosis not present

## 2020-02-18 DIAGNOSIS — J301 Allergic rhinitis due to pollen: Secondary | ICD-10-CM | POA: Diagnosis not present

## 2020-02-19 DIAGNOSIS — M1990 Unspecified osteoarthritis, unspecified site: Secondary | ICD-10-CM | POA: Diagnosis not present

## 2020-02-19 DIAGNOSIS — Z299 Encounter for prophylactic measures, unspecified: Secondary | ICD-10-CM | POA: Diagnosis not present

## 2020-02-19 DIAGNOSIS — E1165 Type 2 diabetes mellitus with hyperglycemia: Secondary | ICD-10-CM | POA: Diagnosis not present

## 2020-02-19 DIAGNOSIS — E1142 Type 2 diabetes mellitus with diabetic polyneuropathy: Secondary | ICD-10-CM | POA: Diagnosis not present

## 2020-02-19 DIAGNOSIS — Z6833 Body mass index (BMI) 33.0-33.9, adult: Secondary | ICD-10-CM | POA: Diagnosis not present

## 2020-02-19 DIAGNOSIS — I10 Essential (primary) hypertension: Secondary | ICD-10-CM | POA: Diagnosis not present

## 2020-02-20 DIAGNOSIS — J301 Allergic rhinitis due to pollen: Secondary | ICD-10-CM | POA: Diagnosis not present

## 2020-02-23 DIAGNOSIS — J301 Allergic rhinitis due to pollen: Secondary | ICD-10-CM | POA: Diagnosis not present

## 2020-02-25 DIAGNOSIS — J301 Allergic rhinitis due to pollen: Secondary | ICD-10-CM | POA: Diagnosis not present

## 2020-02-27 DIAGNOSIS — M159 Polyosteoarthritis, unspecified: Secondary | ICD-10-CM | POA: Diagnosis not present

## 2020-02-27 DIAGNOSIS — I10 Essential (primary) hypertension: Secondary | ICD-10-CM | POA: Diagnosis not present

## 2020-02-27 DIAGNOSIS — E119 Type 2 diabetes mellitus without complications: Secondary | ICD-10-CM | POA: Diagnosis not present

## 2020-02-27 DIAGNOSIS — J301 Allergic rhinitis due to pollen: Secondary | ICD-10-CM | POA: Diagnosis not present

## 2020-03-01 DIAGNOSIS — J301 Allergic rhinitis due to pollen: Secondary | ICD-10-CM | POA: Diagnosis not present

## 2020-03-02 DIAGNOSIS — H5203 Hypermetropia, bilateral: Secondary | ICD-10-CM | POA: Diagnosis not present

## 2020-03-02 DIAGNOSIS — E113593 Type 2 diabetes mellitus with proliferative diabetic retinopathy without macular edema, bilateral: Secondary | ICD-10-CM | POA: Diagnosis not present

## 2020-03-03 DIAGNOSIS — J301 Allergic rhinitis due to pollen: Secondary | ICD-10-CM | POA: Diagnosis not present

## 2020-03-05 DIAGNOSIS — J301 Allergic rhinitis due to pollen: Secondary | ICD-10-CM | POA: Diagnosis not present

## 2020-03-08 DIAGNOSIS — J301 Allergic rhinitis due to pollen: Secondary | ICD-10-CM | POA: Diagnosis not present

## 2020-03-09 DIAGNOSIS — E1143 Type 2 diabetes mellitus with diabetic autonomic (poly)neuropathy: Secondary | ICD-10-CM | POA: Diagnosis not present

## 2020-03-09 DIAGNOSIS — M79604 Pain in right leg: Secondary | ICD-10-CM | POA: Diagnosis not present

## 2020-03-09 DIAGNOSIS — M79605 Pain in left leg: Secondary | ICD-10-CM | POA: Diagnosis not present

## 2020-03-10 DIAGNOSIS — J301 Allergic rhinitis due to pollen: Secondary | ICD-10-CM | POA: Diagnosis not present

## 2020-03-12 DIAGNOSIS — J301 Allergic rhinitis due to pollen: Secondary | ICD-10-CM | POA: Diagnosis not present

## 2020-03-15 DIAGNOSIS — J301 Allergic rhinitis due to pollen: Secondary | ICD-10-CM | POA: Diagnosis not present

## 2020-03-16 DIAGNOSIS — E11319 Type 2 diabetes mellitus with unspecified diabetic retinopathy without macular edema: Secondary | ICD-10-CM | POA: Diagnosis not present

## 2020-03-16 DIAGNOSIS — E785 Hyperlipidemia, unspecified: Secondary | ICD-10-CM | POA: Diagnosis not present

## 2020-03-16 DIAGNOSIS — E109 Type 1 diabetes mellitus without complications: Secondary | ICD-10-CM | POA: Diagnosis not present

## 2020-03-16 DIAGNOSIS — E118 Type 2 diabetes mellitus with unspecified complications: Secondary | ICD-10-CM | POA: Diagnosis not present

## 2020-03-16 DIAGNOSIS — I2510 Atherosclerotic heart disease of native coronary artery without angina pectoris: Secondary | ICD-10-CM | POA: Diagnosis not present

## 2020-03-16 DIAGNOSIS — E1140 Type 2 diabetes mellitus with diabetic neuropathy, unspecified: Secondary | ICD-10-CM | POA: Diagnosis not present

## 2020-03-16 DIAGNOSIS — I10 Essential (primary) hypertension: Secondary | ICD-10-CM | POA: Diagnosis not present

## 2020-03-16 DIAGNOSIS — E039 Hypothyroidism, unspecified: Secondary | ICD-10-CM | POA: Diagnosis not present

## 2020-03-16 DIAGNOSIS — E669 Obesity, unspecified: Secondary | ICD-10-CM | POA: Diagnosis not present

## 2020-03-16 DIAGNOSIS — M1460 Charcot's joint, unspecified site: Secondary | ICD-10-CM | POA: Diagnosis not present

## 2020-03-16 DIAGNOSIS — Z794 Long term (current) use of insulin: Secondary | ICD-10-CM | POA: Diagnosis not present

## 2020-03-16 DIAGNOSIS — Z9641 Presence of insulin pump (external) (internal): Secondary | ICD-10-CM | POA: Diagnosis not present

## 2020-03-17 DIAGNOSIS — J301 Allergic rhinitis due to pollen: Secondary | ICD-10-CM | POA: Diagnosis not present

## 2020-03-19 ENCOUNTER — Ambulatory Visit: Payer: Medicare Other | Admitting: Neurology

## 2020-03-19 DIAGNOSIS — J301 Allergic rhinitis due to pollen: Secondary | ICD-10-CM | POA: Diagnosis not present

## 2020-03-22 DIAGNOSIS — J301 Allergic rhinitis due to pollen: Secondary | ICD-10-CM | POA: Diagnosis not present

## 2020-03-24 DIAGNOSIS — Z299 Encounter for prophylactic measures, unspecified: Secondary | ICD-10-CM | POA: Diagnosis not present

## 2020-03-24 DIAGNOSIS — E1142 Type 2 diabetes mellitus with diabetic polyneuropathy: Secondary | ICD-10-CM | POA: Diagnosis not present

## 2020-03-24 DIAGNOSIS — N183 Chronic kidney disease, stage 3 unspecified: Secondary | ICD-10-CM | POA: Diagnosis not present

## 2020-03-24 DIAGNOSIS — E1122 Type 2 diabetes mellitus with diabetic chronic kidney disease: Secondary | ICD-10-CM | POA: Diagnosis not present

## 2020-03-24 DIAGNOSIS — J301 Allergic rhinitis due to pollen: Secondary | ICD-10-CM | POA: Diagnosis not present

## 2020-03-24 DIAGNOSIS — I10 Essential (primary) hypertension: Secondary | ICD-10-CM | POA: Diagnosis not present

## 2020-03-24 DIAGNOSIS — E1165 Type 2 diabetes mellitus with hyperglycemia: Secondary | ICD-10-CM | POA: Diagnosis not present

## 2020-03-25 DIAGNOSIS — E119 Type 2 diabetes mellitus without complications: Secondary | ICD-10-CM | POA: Diagnosis not present

## 2020-03-25 DIAGNOSIS — I10 Essential (primary) hypertension: Secondary | ICD-10-CM | POA: Diagnosis not present

## 2020-03-25 DIAGNOSIS — M159 Polyosteoarthritis, unspecified: Secondary | ICD-10-CM | POA: Diagnosis not present

## 2020-03-26 DIAGNOSIS — J301 Allergic rhinitis due to pollen: Secondary | ICD-10-CM | POA: Diagnosis not present

## 2020-03-29 DIAGNOSIS — R2241 Localized swelling, mass and lump, right lower limb: Secondary | ICD-10-CM | POA: Diagnosis not present

## 2020-03-29 DIAGNOSIS — M7989 Other specified soft tissue disorders: Secondary | ICD-10-CM | POA: Diagnosis not present

## 2020-03-29 DIAGNOSIS — J301 Allergic rhinitis due to pollen: Secondary | ICD-10-CM | POA: Diagnosis not present

## 2020-03-31 DIAGNOSIS — J301 Allergic rhinitis due to pollen: Secondary | ICD-10-CM | POA: Diagnosis not present

## 2020-03-31 NOTE — Progress Notes (Signed)
NEUROLOGY FOLLOW UP OFFICE NOTE  Jodi Ray [id number]  HISTORY OF PRESENT ILLNESS: Jodi Ray is a46year oldhanded woman with chronic pain related to complex regional pain syndrome (with spinal stimulator), hypertension, type 1 diabetes mellitus with polyneuropathy, OSA on CPAP, hypothyroidism, polymyalgia rheumatica who follows up for cluster headache.  UPDATE: Tapered topiramate down to 100mg  at bedtime.  Intensity:  severe Duration:  10 minutes with O2 Frequency:  Headaches seem to occur with change in barometric pressure (such as right before a storm).   3 or 4 a week during a storm.  Otherwise, no more than 1 a week.   Also, now she reports dizziness with her cluster headaches.  She has fallen twice due to the dizziness. She doesn't think the 300mg  daily of topiramate is helping.  ss frequent and intense.  They are manageable.    Current NSAIDs: ASA 81mg  daily. Current analgesics: none Current antihypertensive: Lisinopril, Lasix, Coreg Current anti-epileptic:topiramate100mg  at bedtime,gabapentin1200mg at bedtime Current anti-CGRP: Emgality Supplements: Melatonin20mg ; CoQ10 100mg ; D3 Other therapy:  100% O2  Other medications:  Synthroid  HISTORY: IPrimary Stabbing Headache: Onset:2011.She was previously treated in [place], where she was diagnosed with primary stabbing headache. Location:Right frontal region Quality:stabbing Intensity:10/10 Aura:no Prodrome:no Associated symptoms:Some nausea if severe. No autonomic symptoms. Duration:10 to 60 minutes Frequency:4 times a week Triggers/exacerbating factors:change in weather. Relieving factors:none Activity:Cannot function when experiencing it.  II Cluster headache:She began havingnew intractableheadaches in late Decemeber2019.Her right eye gets bloodshot and right nare runs. Right side of face gets red. They stabbing headache starts above the right  eye and radiates, constant for a month, fluctuating in intensity. She [facility] to [facility] ED on 08/11/18. CT of head and CTA of head and neck were performed and personally reviewed, showing no acute abnormality or aneurysm.Sed rate from 08/22/18 was 24.Past management: Prednisone taper, headache cocktail  IIIMigraines:They are bi-frontal/maxillary and associated with slight nausea.She has had this before when her allergies "act up."  IVDiabetic neuropathy: Secondary to Type 1 diabetes.  VComplex regional pain syndrome: following an accident where she fell down the steps and crushed her right leg.She takes gabapentin, tramadol and has a spinal nerve stimulator.She takes this for her painful diabetic neuropathy as well.  VIRight arm pain and numbness: Since 2015, she has had episodes of right arm pain and numbness. It only occurs when she is laying in bed, and occurs no matter what position. Her entire arm goes "dead". It is both numb, painful and unable to move it. There is no shooting pain down the arm from the neck, however she gets occasional shooting pain from the right side of her neck into the shoulder. She has to use her other arm to shake it out and it resolves in a minute or two. It occurs every night. She was sent to pain management for possible cervical radiculopathy or thoracic outlet syndrome. She did receive injections, which helped. She had an MRI of the cervical and thoracic spine on 06/12/14. Imaging not available, but report mentions diffuse facet arthropathy and degenerative changes in the cervical spine, but no nerve root impingement or cord compression. There was no evidence of thoracic outlet syndrome. She was sent to [facility] for further evaluation. 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  They told her that her symptoms were related to her diabetic neuropathy.  VIICerebral aneurysm: In 2009, she had  a CTA of the head which reportedly showed a 1-72mm an eurysm at the junction of right A1 and A2 segment of the ACA. However, a repeat CTA performed on 02/22/12 did not reveal any aneurysm.  She cannot have an MRI due to spinal stimulator.  Pastmedictions: NSAIDs:Indomethacin 25mg  three times daily (effective but discontinued due to elevated liver enzymes) Past analgesics:  Lidocaine nasal (lost efficacy) Past anthypertensive medication:Verapamil (ineffective) Past antiepileptics: Trokendi XR (effective but expensive)  PAST MEDICAL HISTORY: Past Medical History:  Diagnosis Date  . 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 Anemia   . Brain aneurysm    frontal lobe  . CAD (coronary artery disease)    a. 02/2018: s/p DES to Proximal LAD with residual 20% RCA stenosis.  . Chronic pain   . Diabetes mellitus without complication ([place])   . Headache   . History of left bundle branch block (LBBB)   . HTN (hypertension)   . Hx of cardiovascular stress test 10/2016   intermediate risk study  . Hypothyroidism   . Neuromuscular disorder ([place])   . Neuropathy   . Obesity   . OSA on CPAP 03/10/2015  . Retinopathy   . Sleep apnea   . Varicose veins of both lower extremities     MEDICATIONS: Current Outpatient Medications on File Prior to Visit  Medication Sig Dispense Refill  . aspirin EC 81 MG tablet Take 81 mg by mouth daily.    Marland Kitchen atorvastatin (LIPITOR) 80 MG tablet TAKE ONE TABLET BY MOUTH DAILY AT 6 PM 90 tablet 3  . carvedilol (COREG) 3.125 MG tablet Take 1 tablet (3.125 mg) 1 hour before cardiac rehab 90 tablet 3  . Cholecalciferol (VITAMIN D-3) 5000 units TABS Take 5,000 Units by mouth daily.     . Coenzyme Q10 (COQ-10) 100 MG CAPS Take 1 capsule by mouth daily.    Marland Kitchen EPINEPHrine 0.3 mg/0.3 mL IJ SOAJ injection Inject 0.3 mg into the muscle as needed.  1  . famotidine (PEPCID)  20 MG tablet Take 1 tablet (20 mg total) by mouth at bedtime. 30 tablet 1  . furosemide (LASIX) 20 MG tablet Take 1 tablet (20 mg total) by mouth as needed for fluid.    . Galcanezumab-gnlm (EMGALITY) 120 MG/ML SOAJ Inject 120 mg into the skin every 30 (thirty) days. 1 pen 11  . insulin glargine (LANTUS) 100 UNIT/ML injection Inject 0.12 mLs (12 Units total) into the skin at bedtime for 1 dose. 10 mL 0  . Insulin Human (INSULIN PUMP) SOLN Inject into the skin continuous.     Marland Kitchen levothyroxine (SYNTHROID, LEVOTHROID) 125 MCG tablet Take 125 mcg by mouth daily before breakfast.   0  . lisinopril (ZESTRIL) 10 MG tablet Take 10 mg by mouth daily.    . metFORMIN (GLUCOPHAGE) 500 MG tablet Take 1 tablet (500 mg total) by mouth 2 (two) times daily with a meal.    . Multiple Vitamins-Minerals (CENTRUM SILVER 50+WOMEN) TABS Take 1 tablet by mouth daily.     . nitroGLYCERIN (NITROSTAT) 0.4 MG SL tablet Place 1 tablet (0.4 mg total) under the tongue every 5 (five) minutes as needed for chest pain. 25 tablet 3  . NOVOLOG 100 UNIT/ML injection Inject into the skin continuous.   0  . Omega-3 Fatty Acids (FISH OIL) 1000 MG CAPS Take 1,000 mg by mouth daily.    . OXYGEN Inhale into the lungs. As needed    . pantoprazole (PROTONIX) 40 MG tablet Take 1 tablet (40 mg  total) by mouth 2 (two) times daily before a meal. 60 tablet 0  . Polyethylene Glycol 400 (BLINK TEARS) 0.25 % SOLN Apply 1 drop to eye daily as needed (for eye irritation/dryness).    . topiramate (TOPAMAX) 100 MG tablet Take 3 tablets (300 mg total) by mouth at bedtime. 3 TABLETS AT BEDTIME (Patient taking differently: Take 100 mg by mouth at bedtime. 1 TABLET AT BEDTIME) 90 tablet 0   No current facility-administered medications on file prior to visit.    ALLERGIES: Allergies  Allergen Reactions  . Fentanyl Nausea And Vomiting and Other (See Comments)    Nausea, vomiting, loss of consciousness, requiring reversal    FAMILY HISTORY: Family  History  Problem Relation Age of Onset  . Cancer Mother 47       colon  . Cancer Father        renal cell  . Cancer Brother        bladder  . Cancer Sister        primary brain, and primary breast too  . Cancer Sister        breast   . Cancer Other        multiple aunts with breast, cervical, or colon cancer    SOCIAL HISTORY: Social History   Socioeconomic History  . Marital status: Widowed    Spouse name: Not on file  . Number of children: 0  . Years of education: Not on file  . Highest education level: Bachelor's degree (e.g., BA, AB, BS)  Occupational History  . Occupation: Retired  Tobacco Use  . Smoking status: Never Smoker  . Smokeless tobacco: Never Used  Vaping Use  . Vaping Use: Never used  Substance and Sexual Activity  . Alcohol use: No    Alcohol/week: 0.0 standard drinks  . Drug use: No  . Sexual activity: Yes    Partners: Male  Other Topics Concern  . Not on file  Social History Narrative   Drinks about 1 cup coffee day.   Cardiac rehab 3x week   One level home with significant other   Bachelors in Accounting      Right handed   No children   widow   Social Determinants of Health   Financial Resource Strain:   . Difficulty of Paying Living Expenses: Not on file  Food Insecurity:   . Worried About Charity fundraiser in the Last Year: Not on file  . Ran Out of Food in the Last Year: Not on file  Transportation Needs:   . Lack of Transportation (Medical): Not on file  . Lack of Transportation (Non-Medical): Not on file  Physical Activity:   . Days of Exercise per Week: Not on file  . Minutes of Exercise per Session: Not on file  Stress:   . Feeling of Stress : Not on file  Social Connections:   . Frequency of Communication with Friends and Family: Not on file  . Frequency of Social Gatherings with Friends and Family: Not on file  . Attends Religious Services: Not on file  . Active Member of Clubs or Organizations: Not on file  .  Attends Archivist Meetings: Not on file  . Marital Status: Not on file  Intimate Partner Violence:   . Fear of Current or Ex-Partner: Not on file  . Emotionally Abused: Not on file  . Physically Abused: Not on file  . Sexually Abused: Not on file    PHYSICAL EXAM: Blood pressure Marland Kitchen)  147/75, pulse 76, height 5\' 8"  (1.727 m), weight 213 lb (96.6 kg), SpO2 97 %. General: No acute distress.  Patient appears well-groomed.   Head:  Normocephalic/atraumatic Eyes:  Fundi examined but not visualized Neck: supple, no paraspinal tenderness, full range of motion Heart:  Regular rate and rhythm Lungs:  Clear to auscultation bilaterally Back: No paraspinal tenderness Neurological Exam: alert and oriented to person, place, and time. Attention span and concentration intact, recent and remote memory intact, fund of knowledge intact.  Speech fluent and not dysarthric, language intact.  CN II-XII intact. Bulk and tone normal, muscle strength 5/5 throughout.  Sensation to light touch, temperature and vibration intact.  Deep tendon reflexes 2+ throughout, toes downgoing.  Finger to nose testing intact.  Cautious gait.   IMPRESSION: 1.  Episodic cluster headache 2.  HTN  PLAN: 1.  For preventative management, Emgality.  Will have her taper off of topiramate. 2.  For abortive therapy, 100% O2 15 L/min for 15-20 minutes 3.  Limit use of pain relievers to no more than 2 days out of week to prevent risk of rebound or medication-overuse headache. 4.  Keep headache diary 5.  Follow up 6 months   Metta Clines, DO  CC: Monico Blitz, MD

## 2020-04-02 ENCOUNTER — Ambulatory Visit (INDEPENDENT_AMBULATORY_CARE_PROVIDER_SITE_OTHER): Payer: Medicare Other | Admitting: Neurology

## 2020-04-02 ENCOUNTER — Other Ambulatory Visit: Payer: Self-pay

## 2020-04-02 ENCOUNTER — Encounter: Payer: Self-pay | Admitting: Neurology

## 2020-04-02 VITALS — BP 147/75 | HR 76 | Ht 68.0 in | Wt 213.0 lb

## 2020-04-02 DIAGNOSIS — G44019 Episodic cluster headache, not intractable: Secondary | ICD-10-CM | POA: Diagnosis not present

## 2020-04-02 DIAGNOSIS — I10 Essential (primary) hypertension: Secondary | ICD-10-CM

## 2020-04-02 DIAGNOSIS — I25118 Atherosclerotic heart disease of native coronary artery with other forms of angina pectoris: Secondary | ICD-10-CM

## 2020-04-02 DIAGNOSIS — J301 Allergic rhinitis due to pollen: Secondary | ICD-10-CM | POA: Diagnosis not present

## 2020-04-02 NOTE — Patient Instructions (Addendum)
1.  Continue Emgality 2.  Continue oxygen as needed 3.  Take 1/2 topiramate tablet at bedtime for 2 weeks, then STOP 4.  Follow up in 4 to 6 months.

## 2020-04-05 DIAGNOSIS — J301 Allergic rhinitis due to pollen: Secondary | ICD-10-CM | POA: Diagnosis not present

## 2020-04-07 DIAGNOSIS — J301 Allergic rhinitis due to pollen: Secondary | ICD-10-CM | POA: Diagnosis not present

## 2020-04-07 DIAGNOSIS — N951 Menopausal and female climacteric states: Secondary | ICD-10-CM | POA: Diagnosis not present

## 2020-04-07 DIAGNOSIS — R635 Abnormal weight gain: Secondary | ICD-10-CM | POA: Diagnosis not present

## 2020-04-09 DIAGNOSIS — J301 Allergic rhinitis due to pollen: Secondary | ICD-10-CM | POA: Diagnosis not present

## 2020-04-12 DIAGNOSIS — J301 Allergic rhinitis due to pollen: Secondary | ICD-10-CM | POA: Diagnosis not present

## 2020-04-13 DIAGNOSIS — E1140 Type 2 diabetes mellitus with diabetic neuropathy, unspecified: Secondary | ICD-10-CM | POA: Diagnosis not present

## 2020-04-13 DIAGNOSIS — E1151 Type 2 diabetes mellitus with diabetic peripheral angiopathy without gangrene: Secondary | ICD-10-CM | POA: Diagnosis not present

## 2020-04-14 DIAGNOSIS — J301 Allergic rhinitis due to pollen: Secondary | ICD-10-CM | POA: Diagnosis not present

## 2020-04-16 DIAGNOSIS — J301 Allergic rhinitis due to pollen: Secondary | ICD-10-CM | POA: Diagnosis not present

## 2020-04-19 DIAGNOSIS — J301 Allergic rhinitis due to pollen: Secondary | ICD-10-CM | POA: Diagnosis not present

## 2020-04-21 DIAGNOSIS — J301 Allergic rhinitis due to pollen: Secondary | ICD-10-CM | POA: Diagnosis not present

## 2020-04-23 DIAGNOSIS — J301 Allergic rhinitis due to pollen: Secondary | ICD-10-CM | POA: Diagnosis not present

## 2020-04-26 DIAGNOSIS — J301 Allergic rhinitis due to pollen: Secondary | ICD-10-CM | POA: Diagnosis not present

## 2020-04-28 DIAGNOSIS — J301 Allergic rhinitis due to pollen: Secondary | ICD-10-CM | POA: Diagnosis not present

## 2020-04-29 DIAGNOSIS — M159 Polyosteoarthritis, unspecified: Secondary | ICD-10-CM | POA: Diagnosis not present

## 2020-04-29 DIAGNOSIS — E119 Type 2 diabetes mellitus without complications: Secondary | ICD-10-CM | POA: Diagnosis not present

## 2020-04-29 DIAGNOSIS — I10 Essential (primary) hypertension: Secondary | ICD-10-CM | POA: Diagnosis not present

## 2020-04-30 DIAGNOSIS — J301 Allergic rhinitis due to pollen: Secondary | ICD-10-CM | POA: Diagnosis not present

## 2020-05-03 DIAGNOSIS — J301 Allergic rhinitis due to pollen: Secondary | ICD-10-CM | POA: Diagnosis not present

## 2020-05-05 DIAGNOSIS — J301 Allergic rhinitis due to pollen: Secondary | ICD-10-CM | POA: Diagnosis not present

## 2020-05-07 DIAGNOSIS — J309 Allergic rhinitis, unspecified: Secondary | ICD-10-CM | POA: Diagnosis not present

## 2020-05-10 DIAGNOSIS — J309 Allergic rhinitis, unspecified: Secondary | ICD-10-CM | POA: Diagnosis not present

## 2020-05-12 DIAGNOSIS — J309 Allergic rhinitis, unspecified: Secondary | ICD-10-CM | POA: Diagnosis not present

## 2020-05-13 ENCOUNTER — Other Ambulatory Visit (HOSPITAL_COMMUNITY)
Admission: RE | Admit: 2020-05-13 | Discharge: 2020-05-13 | Disposition: A | Discharge status: Home / Self Care | Payer: Medicare Other | Source: Ambulatory Visit | Attending: Nephrology | Admitting: Nephrology

## 2020-05-13 DIAGNOSIS — N170 Acute kidney failure with tubular necrosis: Secondary | ICD-10-CM | POA: Diagnosis not present

## 2020-05-13 DIAGNOSIS — E1129 Type 2 diabetes mellitus with other diabetic kidney complication: Secondary | ICD-10-CM | POA: Insufficient documentation

## 2020-05-13 DIAGNOSIS — E875 Hyperkalemia: Secondary | ICD-10-CM | POA: Diagnosis not present

## 2020-05-13 DIAGNOSIS — E1122 Type 2 diabetes mellitus with diabetic chronic kidney disease: Secondary | ICD-10-CM | POA: Insufficient documentation

## 2020-05-13 DIAGNOSIS — N189 Chronic kidney disease, unspecified: Secondary | ICD-10-CM | POA: Insufficient documentation

## 2020-05-13 DIAGNOSIS — R809 Proteinuria, unspecified: Secondary | ICD-10-CM | POA: Insufficient documentation

## 2020-05-13 LAB — CBC
HCT: 33.5 % — ABNORMAL LOW (ref 36.0–46.0)
Hemoglobin: 11.1 g/dL — ABNORMAL LOW (ref 12.0–15.0)
MCH: 29.7 pg (ref 26.0–34.0)
MCHC: 33.1 g/dL (ref 30.0–36.0)
MCV: 89.6 fL (ref 80.0–100.0)
Platelets: 218 10*3/uL (ref 150–400)
RBC: 3.74 MIL/uL — ABNORMAL LOW (ref 3.87–5.11)
RDW: 13.1 % (ref 11.5–15.5)
WBC: 4.1 10*3/uL (ref 4.0–10.5)
nRBC: 0 % (ref 0.0–0.2)

## 2020-05-13 LAB — RENAL FUNCTION PANEL
Albumin: 3.7 g/dL (ref 3.5–5.0)
Anion gap: 9 (ref 5–15)
BUN: 33 mg/dL — ABNORMAL HIGH (ref 8–23)
CO2: 24 mmol/L (ref 22–32)
Calcium: 9.7 mg/dL (ref 8.9–10.3)
Chloride: 99 mmol/L (ref 98–111)
Creatinine, Ser: 0.92 mg/dL (ref 0.44–1.00)
GFR, Estimated: >60 mL/min (ref >60)
Glucose, Bld: 155 mg/dL — ABNORMAL HIGH (ref 70–99)
Phosphorus: 3.4 mg/dL (ref 2.5–4.6)
Potassium: 4.3 mmol/L (ref 3.5–5.1)
Sodium: 132 mmol/L — ABNORMAL LOW (ref 135–145)

## 2020-05-13 LAB — PROTEIN / CREATININE RATIO, URINE
Creatinine, Urine: 45.32 mg/dL
Protein Creatinine Ratio: 0.15 mg/mg{Cre} (ref 0.00–0.15)
Total Protein, Urine: 7 mg/dL

## 2020-05-14 DIAGNOSIS — J309 Allergic rhinitis, unspecified: Secondary | ICD-10-CM | POA: Diagnosis not present

## 2020-05-17 DIAGNOSIS — J309 Allergic rhinitis, unspecified: Secondary | ICD-10-CM | POA: Diagnosis not present

## 2020-05-19 DIAGNOSIS — E6609 Other obesity due to excess calories: Secondary | ICD-10-CM | POA: Diagnosis not present

## 2020-05-19 DIAGNOSIS — E871 Hypo-osmolality and hyponatremia: Secondary | ICD-10-CM | POA: Diagnosis not present

## 2020-05-19 DIAGNOSIS — N170 Acute kidney failure with tubular necrosis: Secondary | ICD-10-CM | POA: Diagnosis not present

## 2020-05-19 DIAGNOSIS — E1129 Type 2 diabetes mellitus with other diabetic kidney complication: Secondary | ICD-10-CM | POA: Diagnosis not present

## 2020-05-19 DIAGNOSIS — R809 Proteinuria, unspecified: Secondary | ICD-10-CM | POA: Diagnosis not present

## 2020-05-19 DIAGNOSIS — E1122 Type 2 diabetes mellitus with diabetic chronic kidney disease: Secondary | ICD-10-CM | POA: Diagnosis not present

## 2020-05-19 DIAGNOSIS — N189 Chronic kidney disease, unspecified: Secondary | ICD-10-CM | POA: Diagnosis not present

## 2020-05-19 DIAGNOSIS — I129 Hypertensive chronic kidney disease with stage 1 through stage 4 chronic kidney disease, or unspecified chronic kidney disease: Secondary | ICD-10-CM | POA: Diagnosis not present

## 2020-05-19 DIAGNOSIS — J309 Allergic rhinitis, unspecified: Secondary | ICD-10-CM | POA: Diagnosis not present

## 2020-05-21 DIAGNOSIS — J309 Allergic rhinitis, unspecified: Secondary | ICD-10-CM | POA: Diagnosis not present

## 2020-05-24 DIAGNOSIS — J309 Allergic rhinitis, unspecified: Secondary | ICD-10-CM | POA: Diagnosis not present

## 2020-05-25 ENCOUNTER — Other Ambulatory Visit: Payer: Self-pay | Admitting: *Deleted

## 2020-05-25 MED ORDER — CARVEDILOL 3.125 MG PO TABS: ORAL_TABLET | ORAL | 0 refills | Status: DC

## 2020-05-26 DIAGNOSIS — J309 Allergic rhinitis, unspecified: Secondary | ICD-10-CM | POA: Diagnosis not present

## 2020-05-28 DIAGNOSIS — I10 Essential (primary) hypertension: Secondary | ICD-10-CM | POA: Diagnosis not present

## 2020-05-28 DIAGNOSIS — J309 Allergic rhinitis, unspecified: Secondary | ICD-10-CM | POA: Diagnosis not present

## 2020-05-28 DIAGNOSIS — M159 Polyosteoarthritis, unspecified: Secondary | ICD-10-CM | POA: Diagnosis not present

## 2020-05-28 DIAGNOSIS — E119 Type 2 diabetes mellitus without complications: Secondary | ICD-10-CM | POA: Diagnosis not present

## 2020-05-31 DIAGNOSIS — J309 Allergic rhinitis, unspecified: Secondary | ICD-10-CM | POA: Diagnosis not present

## 2020-06-01 ENCOUNTER — Other Ambulatory Visit: Payer: Self-pay

## 2020-06-01 MED ORDER — ATORVASTATIN CALCIUM 80 MG PO TABS: ORAL_TABLET | ORAL | 3 refills | Status: DC

## 2020-06-01 NOTE — Telephone Encounter (Signed)
Refilled atorvstatin

## 2020-06-02 DIAGNOSIS — J309 Allergic rhinitis, unspecified: Secondary | ICD-10-CM | POA: Diagnosis not present

## 2020-06-04 DIAGNOSIS — J309 Allergic rhinitis, unspecified: Secondary | ICD-10-CM | POA: Diagnosis not present

## 2020-06-07 DIAGNOSIS — J309 Allergic rhinitis, unspecified: Secondary | ICD-10-CM | POA: Diagnosis not present

## 2020-06-09 DIAGNOSIS — J309 Allergic rhinitis, unspecified: Secondary | ICD-10-CM | POA: Diagnosis not present

## 2020-06-11 DIAGNOSIS — J309 Allergic rhinitis, unspecified: Secondary | ICD-10-CM | POA: Diagnosis not present

## 2020-06-14 DIAGNOSIS — J309 Allergic rhinitis, unspecified: Secondary | ICD-10-CM | POA: Diagnosis not present

## 2020-06-15 DIAGNOSIS — Z794 Long term (current) use of insulin: Secondary | ICD-10-CM | POA: Diagnosis not present

## 2020-06-15 DIAGNOSIS — E109 Type 1 diabetes mellitus without complications: Secondary | ICD-10-CM | POA: Diagnosis not present

## 2020-06-15 DIAGNOSIS — I2510 Atherosclerotic heart disease of native coronary artery without angina pectoris: Secondary | ICD-10-CM | POA: Diagnosis not present

## 2020-06-15 DIAGNOSIS — E118 Type 2 diabetes mellitus with unspecified complications: Secondary | ICD-10-CM | POA: Diagnosis not present

## 2020-06-15 DIAGNOSIS — M1460 Charcot's joint, unspecified site: Secondary | ICD-10-CM | POA: Diagnosis not present

## 2020-06-15 DIAGNOSIS — I10 Essential (primary) hypertension: Secondary | ICD-10-CM | POA: Diagnosis not present

## 2020-06-15 DIAGNOSIS — E669 Obesity, unspecified: Secondary | ICD-10-CM | POA: Diagnosis not present

## 2020-06-15 DIAGNOSIS — E039 Hypothyroidism, unspecified: Secondary | ICD-10-CM | POA: Diagnosis not present

## 2020-06-15 DIAGNOSIS — E1140 Type 2 diabetes mellitus with diabetic neuropathy, unspecified: Secondary | ICD-10-CM | POA: Diagnosis not present

## 2020-06-15 DIAGNOSIS — E785 Hyperlipidemia, unspecified: Secondary | ICD-10-CM | POA: Diagnosis not present

## 2020-06-15 DIAGNOSIS — Z9641 Presence of insulin pump (external) (internal): Secondary | ICD-10-CM | POA: Diagnosis not present

## 2020-06-15 DIAGNOSIS — E11319 Type 2 diabetes mellitus with unspecified diabetic retinopathy without macular edema: Secondary | ICD-10-CM | POA: Diagnosis not present

## 2020-06-16 DIAGNOSIS — J309 Allergic rhinitis, unspecified: Secondary | ICD-10-CM | POA: Diagnosis not present

## 2020-06-17 ENCOUNTER — Other Ambulatory Visit (HOSPITAL_COMMUNITY)
Admission: RE | Admit: 2020-06-17 | Discharge: 2020-06-17 | Disposition: A | Discharge status: Home / Self Care | Payer: Medicare Other | Source: Ambulatory Visit | Attending: Nephrology | Admitting: Nephrology

## 2020-06-17 ENCOUNTER — Other Ambulatory Visit: Payer: Self-pay

## 2020-06-17 DIAGNOSIS — I129 Hypertensive chronic kidney disease with stage 1 through stage 4 chronic kidney disease, or unspecified chronic kidney disease: Secondary | ICD-10-CM | POA: Diagnosis not present

## 2020-06-17 DIAGNOSIS — E1129 Type 2 diabetes mellitus with other diabetic kidney complication: Secondary | ICD-10-CM | POA: Diagnosis not present

## 2020-06-17 DIAGNOSIS — E1122 Type 2 diabetes mellitus with diabetic chronic kidney disease: Secondary | ICD-10-CM | POA: Diagnosis not present

## 2020-06-17 DIAGNOSIS — N189 Chronic kidney disease, unspecified: Secondary | ICD-10-CM | POA: Diagnosis not present

## 2020-06-17 DIAGNOSIS — E871 Hypo-osmolality and hyponatremia: Secondary | ICD-10-CM | POA: Diagnosis not present

## 2020-06-17 DIAGNOSIS — E875 Hyperkalemia: Secondary | ICD-10-CM | POA: Diagnosis not present

## 2020-06-17 DIAGNOSIS — N170 Acute kidney failure with tubular necrosis: Secondary | ICD-10-CM | POA: Insufficient documentation

## 2020-06-17 LAB — RENAL FUNCTION PANEL
Albumin: 4 g/dL (ref 3.5–5.0)
Anion gap: 8 (ref 5–15)
BUN: 37 mg/dL — ABNORMAL HIGH (ref 8–23)
CO2: 24 mmol/L (ref 22–32)
Calcium: 9.5 mg/dL (ref 8.9–10.3)
Chloride: 101 mmol/L (ref 98–111)
Creatinine, Ser: 0.98 mg/dL (ref 0.44–1.00)
GFR, Estimated: >60 mL/min (ref >60)
Glucose, Bld: 127 mg/dL — ABNORMAL HIGH (ref 70–99)
Phosphorus: 4 mg/dL (ref 2.5–4.6)
Potassium: 4.8 mmol/L (ref 3.5–5.1)
Sodium: 133 mmol/L — ABNORMAL LOW (ref 135–145)

## 2020-06-18 DIAGNOSIS — J309 Allergic rhinitis, unspecified: Secondary | ICD-10-CM | POA: Diagnosis not present

## 2020-06-21 DIAGNOSIS — J309 Allergic rhinitis, unspecified: Secondary | ICD-10-CM | POA: Diagnosis not present

## 2020-06-23 ENCOUNTER — Other Ambulatory Visit (HOSPITAL_COMMUNITY)
Admission: RE | Admit: 2020-06-23 | Discharge: 2020-06-23 | Disposition: A | Discharge status: Home / Self Care | Payer: Medicare Other | Source: Ambulatory Visit | Attending: Nephrology | Admitting: Nephrology

## 2020-06-23 ENCOUNTER — Other Ambulatory Visit: Payer: Self-pay

## 2020-06-23 DIAGNOSIS — E871 Hypo-osmolality and hyponatremia: Secondary | ICD-10-CM | POA: Diagnosis not present

## 2020-06-23 DIAGNOSIS — N170 Acute kidney failure with tubular necrosis: Secondary | ICD-10-CM | POA: Insufficient documentation

## 2020-06-23 DIAGNOSIS — I129 Hypertensive chronic kidney disease with stage 1 through stage 4 chronic kidney disease, or unspecified chronic kidney disease: Secondary | ICD-10-CM | POA: Diagnosis not present

## 2020-06-23 DIAGNOSIS — E875 Hyperkalemia: Secondary | ICD-10-CM | POA: Insufficient documentation

## 2020-06-23 DIAGNOSIS — N189 Chronic kidney disease, unspecified: Secondary | ICD-10-CM | POA: Insufficient documentation

## 2020-06-23 DIAGNOSIS — J309 Allergic rhinitis, unspecified: Secondary | ICD-10-CM | POA: Diagnosis not present

## 2020-06-23 DIAGNOSIS — E1122 Type 2 diabetes mellitus with diabetic chronic kidney disease: Secondary | ICD-10-CM | POA: Insufficient documentation

## 2020-06-23 LAB — RENAL FUNCTION PANEL
Albumin: 3.8 g/dL (ref 3.5–5.0)
Anion gap: 10 (ref 5–15)
BUN: 30 mg/dL — ABNORMAL HIGH (ref 8–23)
CO2: 24 mmol/L (ref 22–32)
Calcium: 9.4 mg/dL (ref 8.9–10.3)
Chloride: 103 mmol/L (ref 98–111)
Creatinine, Ser: 1.05 mg/dL — ABNORMAL HIGH (ref 0.44–1.00)
GFR, Estimated: >60 mL/min (ref >60)
Glucose, Bld: 94 mg/dL (ref 70–99)
Phosphorus: 4.5 mg/dL (ref 2.5–4.6)
Potassium: 4.7 mmol/L (ref 3.5–5.1)
Sodium: 137 mmol/L (ref 135–145)

## 2020-06-25 DIAGNOSIS — J309 Allergic rhinitis, unspecified: Secondary | ICD-10-CM | POA: Diagnosis not present

## 2020-06-28 DIAGNOSIS — J309 Allergic rhinitis, unspecified: Secondary | ICD-10-CM | POA: Diagnosis not present

## 2020-06-29 DIAGNOSIS — M159 Polyosteoarthritis, unspecified: Secondary | ICD-10-CM | POA: Diagnosis not present

## 2020-06-29 DIAGNOSIS — E119 Type 2 diabetes mellitus without complications: Secondary | ICD-10-CM | POA: Diagnosis not present

## 2020-06-29 DIAGNOSIS — I10 Essential (primary) hypertension: Secondary | ICD-10-CM | POA: Diagnosis not present

## 2020-06-30 DIAGNOSIS — J309 Allergic rhinitis, unspecified: Secondary | ICD-10-CM | POA: Diagnosis not present

## 2020-07-02 DIAGNOSIS — J309 Allergic rhinitis, unspecified: Secondary | ICD-10-CM | POA: Diagnosis not present

## 2020-07-05 DIAGNOSIS — Z2821 Immunization not carried out because of patient refusal: Secondary | ICD-10-CM | POA: Diagnosis not present

## 2020-07-05 DIAGNOSIS — Z299 Encounter for prophylactic measures, unspecified: Secondary | ICD-10-CM | POA: Diagnosis not present

## 2020-07-05 DIAGNOSIS — M79643 Pain in unspecified hand: Secondary | ICD-10-CM | POA: Diagnosis not present

## 2020-07-05 DIAGNOSIS — I10 Essential (primary) hypertension: Secondary | ICD-10-CM | POA: Diagnosis not present

## 2020-07-05 DIAGNOSIS — E1122 Type 2 diabetes mellitus with diabetic chronic kidney disease: Secondary | ICD-10-CM | POA: Diagnosis not present

## 2020-07-05 DIAGNOSIS — J309 Allergic rhinitis, unspecified: Secondary | ICD-10-CM | POA: Diagnosis not present

## 2020-07-05 DIAGNOSIS — Z6833 Body mass index (BMI) 33.0-33.9, adult: Secondary | ICD-10-CM | POA: Diagnosis not present

## 2020-07-07 DIAGNOSIS — J309 Allergic rhinitis, unspecified: Secondary | ICD-10-CM | POA: Diagnosis not present

## 2020-07-09 DIAGNOSIS — J309 Allergic rhinitis, unspecified: Secondary | ICD-10-CM | POA: Diagnosis not present

## 2020-07-12 DIAGNOSIS — J309 Allergic rhinitis, unspecified: Secondary | ICD-10-CM | POA: Diagnosis not present

## 2020-07-14 DIAGNOSIS — J309 Allergic rhinitis, unspecified: Secondary | ICD-10-CM | POA: Diagnosis not present

## 2020-07-16 DIAGNOSIS — J309 Allergic rhinitis, unspecified: Secondary | ICD-10-CM | POA: Diagnosis not present

## 2020-07-19 DIAGNOSIS — J309 Allergic rhinitis, unspecified: Secondary | ICD-10-CM | POA: Diagnosis not present

## 2020-07-21 ENCOUNTER — Other Ambulatory Visit: Payer: Self-pay

## 2020-07-21 DIAGNOSIS — J309 Allergic rhinitis, unspecified: Secondary | ICD-10-CM | POA: Diagnosis not present

## 2020-07-21 DIAGNOSIS — R202 Paresthesia of skin: Secondary | ICD-10-CM

## 2020-07-23 DIAGNOSIS — J309 Allergic rhinitis, unspecified: Secondary | ICD-10-CM | POA: Diagnosis not present

## 2020-07-26 DIAGNOSIS — J309 Allergic rhinitis, unspecified: Secondary | ICD-10-CM | POA: Diagnosis not present

## 2020-07-28 DIAGNOSIS — J309 Allergic rhinitis, unspecified: Secondary | ICD-10-CM | POA: Diagnosis not present

## 2020-07-29 DIAGNOSIS — M159 Polyosteoarthritis, unspecified: Secondary | ICD-10-CM | POA: Diagnosis not present

## 2020-07-29 DIAGNOSIS — I10 Essential (primary) hypertension: Secondary | ICD-10-CM | POA: Diagnosis not present

## 2020-07-29 DIAGNOSIS — E119 Type 2 diabetes mellitus without complications: Secondary | ICD-10-CM | POA: Diagnosis not present

## 2020-07-30 DIAGNOSIS — J309 Allergic rhinitis, unspecified: Secondary | ICD-10-CM | POA: Diagnosis not present

## 2020-08-02 DIAGNOSIS — J309 Allergic rhinitis, unspecified: Secondary | ICD-10-CM | POA: Diagnosis not present

## 2020-08-04 ENCOUNTER — Encounter: Payer: Medicare Other | Admitting: Neurology

## 2020-08-04 DIAGNOSIS — J309 Allergic rhinitis, unspecified: Secondary | ICD-10-CM | POA: Diagnosis not present

## 2020-08-05 ENCOUNTER — Other Ambulatory Visit: Payer: Self-pay

## 2020-08-05 ENCOUNTER — Ambulatory Visit (INDEPENDENT_AMBULATORY_CARE_PROVIDER_SITE_OTHER): Payer: Medicare Other | Admitting: Neurology

## 2020-08-05 DIAGNOSIS — E1040 Type 1 diabetes mellitus with diabetic neuropathy, unspecified: Secondary | ICD-10-CM

## 2020-08-05 DIAGNOSIS — R202 Paresthesia of skin: Secondary | ICD-10-CM

## 2020-08-05 NOTE — Procedures (Signed)
[facility]  [place], [address]  [place], [place] [address] Tel: [phone] Fax:  [phone] Test Date:  08/05/2020  Patient: Jodi Ray DOB: [birth year] Physician: Narda Amber, DO  Sex: Female Height: 5\' 8"  Ref Phys: Monico Blitz, M.D.  ID#: [id number]   Technician:    Patient Complaints: This is a 60 year old female referred for evaluation of bilateral hand paresthesias and weakness.  NCV & EMG Findings: Extensive electrodiagnostic testing of the right upper extremity and additional studies of the left shows:  1. Bilateral median and ulnar sensory responses are absent.  Bilateral radial sensory responses show reduced amplitude (L7.9, R5.1 V).   2. Bilateral median motor responses show prolonged distal onset latency (L5.0, R5.4 ms), reduced amplitude (L4.4, R4.1 mV), and decreased conduction velocity (Elbow-Wrist, L49, R44 m/s).  Bilateral ulnar motor responses show showed prolonged distal onset latency (L3.4, R3.5 ms) and reduced amplitude (L3.0, R2.3 mV).   3. Chronic motor axonal loss changes are seen affecting the distal hand muscles bilaterally, without accompanying active denervation.  Impression: The electrophysiologic findings are consistent with a chronic, symmetric sensorimotor polyneuropathy, with axonal and demyelinating features, affecting the upper extremities.  Overall, these findings are severe in degree electrically.   ___________________________ Narda Amber, DO    Nerve Conduction Studies Anti Sensory Summary Table   Stim Site NR Peak (ms) Norm Peak (ms) P-T Amp (V) Norm P-T Amp  Left Median Anti Sensory (2nd Digit)  33C  Wrist NR  <3.8  >10  Right Median Anti Sensory (2nd Digit)  33C  Wrist NR  <3.8  >10  Left Radial Anti Sensory (Base 1st Digit)  33C  Wrist    2.8 <2.8 7.9 >10  Right Radial Anti Sensory (Base 1st Digit)  33C  Wrist    2.7 <2.8 5.1 >10  Left Ulnar Anti Sensory (5th Digit)  33C  Wrist NR  <3.2  >5  Right  Ulnar Anti Sensory (5th Digit)  33C  Wrist NR  <3.2  >5   Motor Summary Table   Stim Site NR Onset (ms) Norm Onset (ms) O-P Amp (mV) Norm O-P Amp Site1 Site2 Delta-0 (ms) Dist (cm) Vel (m/s) Norm Vel (m/s)  Left Median Motor (Abd Poll Brev)  33C  Wrist    5.0 <4.0 4.4 >5 Elbow Wrist 5.7 28.0 49 >50  Elbow    10.7  4.1         Right Median Motor (Abd Poll Brev)  33C  Wrist    5.4 <4.0 4.1 >5 Elbow Wrist 6.1 27.0 44 >50  Elbow    11.5  2.9         Left Ulnar Motor (Abd Dig Minimi)  33C  Wrist    3.4 <3.1 3.0 >7 B Elbow Wrist 4.8 24.0 50 >50  B Elbow    8.2  2.0  A Elbow B Elbow 2.0 10.0 50 >50  A Elbow    10.2  1.8         Right Ulnar Motor (Abd Dig Minimi)  33C  Wrist    3.5 <3.1 2.3 >7 B Elbow Wrist 4.5 23.0 51 >50  B Elbow    8.0  1.2  A Elbow B Elbow 2.0 10.0 50 >50  A Elbow    10.0  1.1          EMG   Side Muscle Ins Act Fibs Psw Fasc Number Recrt Dur Dur. Amp Amp. Poly Poly. Comment  Right 1stDorInt Nml Nml Nml  Nml SMU Rapid All 1+ All 1+ All 1+ ATR  Right Abd Poll Brev Nml Nml Nml Nml 3- Rapid All 1+ All 1+ All 1+ ATR  Right FlexPolLong Nml Nml Nml Nml 1- Rapid Some 1+ Some 1+ Some 1+ N/A  Right Ext Indicis Nml Nml Nml Nml 2- Rapid Some 1+ Some 1+ Some 1+ N/A  Right PronatorTeres Nml Nml Nml Nml Nml Nml Nml Nml Nml Nml Nml Nml N/A  Right Biceps Nml Nml Nml Nml Nml Nml Nml Nml Nml Nml Nml Nml N/A  Right Triceps Nml Nml Nml Nml Nml Nml Nml Nml Nml Nml Nml Nml N/A  Right Deltoid Nml Nml Nml Nml Nml Nml Nml Nml Nml Nml Nml Nml N/A  Left 1stDorInt Nml Nml Nml Nml SMU Rapid All 1+ All 1+ All 1+ ATR  Left Abd Poll Brev Nml Nml Nml Nml 3- Rapid Most 1+ Most 1+ Most 1+ N/A  Left FlexPolLong Nml Nml Nml Nml 1- Rapid Some 1+ Some 1+ Some 1+ N/A  Left Ext Indicis Nml Nml Nml Nml 2- Rapid Some 1+ Some 1+ Some 1+ N/A  Left PronatorTeres Nml Nml Nml Nml Nml Nml Nml Nml Nml Nml Nml Nml N/A  Left Biceps Nml Nml Nml Nml Nml Nml Nml Nml Nml Nml Nml Nml N/A  Left Triceps Nml Nml Nml Nml  Nml Nml Nml Nml Nml Nml Nml Nml N/A  Left Deltoid Nml Nml Nml Nml Nml Nml Nml Nml Nml Nml Nml Nml N/A      Waveforms:

## 2020-08-06 DIAGNOSIS — J309 Allergic rhinitis, unspecified: Secondary | ICD-10-CM | POA: Diagnosis not present

## 2020-08-09 DIAGNOSIS — I10 Essential (primary) hypertension: Secondary | ICD-10-CM | POA: Diagnosis not present

## 2020-08-09 DIAGNOSIS — Z299 Encounter for prophylactic measures, unspecified: Secondary | ICD-10-CM | POA: Diagnosis not present

## 2020-08-09 DIAGNOSIS — Z789 Other specified health status: Secondary | ICD-10-CM | POA: Diagnosis not present

## 2020-08-09 DIAGNOSIS — E1142 Type 2 diabetes mellitus with diabetic polyneuropathy: Secondary | ICD-10-CM | POA: Diagnosis not present

## 2020-08-09 DIAGNOSIS — J309 Allergic rhinitis, unspecified: Secondary | ICD-10-CM | POA: Diagnosis not present

## 2020-08-09 DIAGNOSIS — E1165 Type 2 diabetes mellitus with hyperglycemia: Secondary | ICD-10-CM | POA: Diagnosis not present

## 2020-08-09 DIAGNOSIS — Z6833 Body mass index (BMI) 33.0-33.9, adult: Secondary | ICD-10-CM | POA: Diagnosis not present

## 2020-08-09 DIAGNOSIS — E1122 Type 2 diabetes mellitus with diabetic chronic kidney disease: Secondary | ICD-10-CM | POA: Diagnosis not present

## 2020-08-10 DIAGNOSIS — E109 Type 1 diabetes mellitus without complications: Secondary | ICD-10-CM | POA: Diagnosis not present

## 2020-08-11 DIAGNOSIS — J309 Allergic rhinitis, unspecified: Secondary | ICD-10-CM | POA: Diagnosis not present

## 2020-08-13 DIAGNOSIS — J309 Allergic rhinitis, unspecified: Secondary | ICD-10-CM | POA: Diagnosis not present

## 2020-08-16 DIAGNOSIS — J309 Allergic rhinitis, unspecified: Secondary | ICD-10-CM | POA: Diagnosis not present

## 2020-08-18 DIAGNOSIS — J309 Allergic rhinitis, unspecified: Secondary | ICD-10-CM | POA: Diagnosis not present

## 2020-08-18 DIAGNOSIS — R635 Abnormal weight gain: Secondary | ICD-10-CM | POA: Diagnosis not present

## 2020-08-18 DIAGNOSIS — N951 Menopausal and female climacteric states: Secondary | ICD-10-CM | POA: Diagnosis not present

## 2020-08-20 DIAGNOSIS — J309 Allergic rhinitis, unspecified: Secondary | ICD-10-CM | POA: Diagnosis not present

## 2020-08-23 DIAGNOSIS — J309 Allergic rhinitis, unspecified: Secondary | ICD-10-CM | POA: Diagnosis not present

## 2020-08-24 ENCOUNTER — Ambulatory Visit: Payer: Medicare Other | Admitting: Neurology

## 2020-08-25 DIAGNOSIS — J309 Allergic rhinitis, unspecified: Secondary | ICD-10-CM | POA: Diagnosis not present

## 2020-08-27 DIAGNOSIS — J309 Allergic rhinitis, unspecified: Secondary | ICD-10-CM | POA: Diagnosis not present

## 2020-08-30 DIAGNOSIS — J309 Allergic rhinitis, unspecified: Secondary | ICD-10-CM | POA: Diagnosis not present

## 2020-09-01 DIAGNOSIS — J309 Allergic rhinitis, unspecified: Secondary | ICD-10-CM | POA: Diagnosis not present

## 2020-09-03 DIAGNOSIS — J309 Allergic rhinitis, unspecified: Secondary | ICD-10-CM | POA: Diagnosis not present

## 2020-09-06 DIAGNOSIS — J309 Allergic rhinitis, unspecified: Secondary | ICD-10-CM | POA: Diagnosis not present

## 2020-09-08 ENCOUNTER — Other Ambulatory Visit: Payer: Self-pay

## 2020-09-08 ENCOUNTER — Ambulatory Visit: Payer: Medicare Other | Admitting: Cardiology

## 2020-09-08 ENCOUNTER — Encounter: Payer: Self-pay | Admitting: Cardiology

## 2020-09-08 ENCOUNTER — Ambulatory Visit (INDEPENDENT_AMBULATORY_CARE_PROVIDER_SITE_OTHER): Payer: Medicare Other | Admitting: Cardiology

## 2020-09-08 ENCOUNTER — Encounter: Payer: Self-pay | Admitting: *Deleted

## 2020-09-08 VITALS — BP 136/72 | Ht 69.0 in | Wt 224.5 lb

## 2020-09-08 DIAGNOSIS — I2510 Atherosclerotic heart disease of native coronary artery without angina pectoris: Secondary | ICD-10-CM | POA: Diagnosis not present

## 2020-09-08 DIAGNOSIS — E782 Mixed hyperlipidemia: Secondary | ICD-10-CM

## 2020-09-08 DIAGNOSIS — I2583 Coronary atherosclerosis due to lipid rich plaque: Secondary | ICD-10-CM | POA: Diagnosis not present

## 2020-09-08 DIAGNOSIS — I10 Essential (primary) hypertension: Secondary | ICD-10-CM

## 2020-09-08 DIAGNOSIS — J309 Allergic rhinitis, unspecified: Secondary | ICD-10-CM | POA: Diagnosis not present

## 2020-09-08 NOTE — Patient Instructions (Signed)
Medication Instructions:  Your physician recommends that you continue on your current medications as directed. Please refer to the Current Medication list given to you today.  *If you need a refill on your cardiac medications before your next appointment, please call your pharmacy*   Lab Work: NONE   If you have labs (blood work) drawn today and your tests are completely normal, you will receive your results only by: . MyChart Message (if you have MyChart) OR . A paper copy in the mail If you have any lab test that is abnormal or we need to change your treatment, we will call you to review the results.   Testing/Procedures: NONE    Follow-Up: At [facility], you and your health needs are our priority.  As part of our continuing mission to provide you with exceptional heart care, we have created designated Provider Care Teams.  These Care Teams include your primary Cardiologist (physician) and Advanced Practice Providers (APPs -  Physician Assistants and Nurse Practitioners) who all work together to provide you with the care you need, when you need it.  We recommend signing up for the patient portal called "MyChart".  Sign up information is provided on this After Visit Summary.  MyChart is used to connect with patients for Virtual Visits (Telemedicine).  Patients are able to view lab/test results, encounter notes, upcoming appointments, etc.  Non-urgent messages can be sent to your provider as well.   To learn more about what you can do with MyChart, go to [email or web address].    Your next appointment:   6 month(s)  The format for your next appointment:   In Person  Provider:   Jonathan Branch, MD   Other Instructions Thank you for choosing [facility]!    

## 2020-09-08 NOTE — Progress Notes (Signed)
Clinical Summary Jodi Ray is a 60 y.o.female seen today for follow up of the following medical problems.   1. CAD - drug-eluting stent placement to the proximal LAD on 03/22/2018. - from notes has chronic nonspecific chest pains  - LV systolic function is normal with an EF of 60 to 65% by echocardiogram in December 2019  - does cycling 3 days a week x 1 hr 15 min high impact up to 11 miles, weight training 4 days a week.  - occasonal chest pressure at very high levels of exertion.  - compliant with meds. Had some issues with low bp's previously, from 03/13/2019 note coreg was changed to just prior to exercise   2. HTN - compliant with meds Had some issues with low bp's previously, from 03/13/2019 note coreg was changed to just prior to exercise   3. Hyperlipidemia - upcoming labs with pcp  4. OSA - using cpap machine, followed by Dr Brett Fairy.   5. DM1 - followed by endocrinology  6. CKD 3 - followed by Dr Theador Hawthorne  7. Chronic LBBB   Has had not COVID vaccine, not in favor at this time.   Past Medical History:  Diagnosis Date  . 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 Anemia   . Brain aneurysm    frontal lobe  . CAD (coronary artery disease)    a. 02/2018: s/p DES to Proximal LAD with residual 20% RCA stenosis.  . Chronic pain   . Diabetes mellitus without complication ([place])   . Headache   . History of left bundle [place] block (LBBB)   . HTN (hypertension)   . Hx of cardiovascular stress test 10/2016   intermediate risk study  . Hypothyroidism   . Neuromuscular disorder ([place])   . Neuropathy   . Obesity   . OSA on CPAP 03/10/2015  . Retinopathy   . Sleep apnea   . Varicose veins of both lower extremities      Allergies  Allergen Reactions  . Fentanyl Nausea And Vomiting and Other (See Comments)    Nausea, vomiting, loss of consciousness, requiring reversal     Current Outpatient Medications  Medication Sig Dispense Refill  . aspirin EC 81 MG tablet Take 81 mg by  mouth daily.    Marland Kitchen atorvastatin (LIPITOR) 80 MG tablet TAKE ONE TABLET BY MOUTH DAILY AT 6 PM 90 tablet 3  . carvedilol (COREG) 3.125 MG tablet Take 1 tablet (3.125 mg) 1 hour before cardiac rehab 90 tablet 0  . Cholecalciferol (VITAMIN D-3) 5000 units TABS Take 5,000 Units by mouth daily.     . Coenzyme Q10 (COQ-10) 100 MG CAPS Take 1 capsule by mouth daily.    Marland Kitchen EPINEPHrine 0.3 mg/0.3 mL IJ SOAJ injection Inject 0.3 mg into the muscle as needed.  1  . famotidine (PEPCID) 20 MG tablet Take 1 tablet (20 mg total) by mouth at bedtime. 30 tablet 1  . furosemide (LASIX) 20 MG tablet Take 1 tablet (20 mg total) by mouth as needed for fluid.    . Galcanezumab-gnlm (EMGALITY) 120 MG/ML SOAJ Inject 120 mg into the skin every 30 (thirty) days. 1 pen 11  . insulin glargine (LANTUS) 100 UNIT/ML injection Inject 0.12 mLs (12 Units total) into the skin at bedtime for 1 dose. 10 mL 0  . Insulin Human (INSULIN PUMP) SOLN Inject into the skin continuous.     Marland Kitchen levothyroxine (SYNTHROID, LEVOTHROID) 125 MCG tablet Take 125 mcg by mouth daily before breakfast.  0  . lisinopril (ZESTRIL) 10 MG tablet Take 10 mg by mouth daily.    . metFORMIN (GLUCOPHAGE) 500 MG tablet Take 1 tablet (500 mg total) by mouth 2 (two) times daily with a meal.    . Multiple Vitamins-Minerals (CENTRUM SILVER 50+WOMEN) TABS Take 1 tablet by mouth daily.     . nitroGLYCERIN (NITROSTAT) 0.4 MG SL tablet Place 1 tablet (0.4 mg total) under the tongue every 5 (five) minutes as needed for chest pain. 25 tablet 3  . NOVOLOG 100 UNIT/ML injection Inject into the skin continuous.   0  . Omega-3 Fatty Acids (FISH OIL) 1000 MG CAPS Take 1,000 mg by mouth daily.    . OXYGEN Inhale into the lungs. As needed    . pantoprazole (PROTONIX) 40 MG tablet Take 1 tablet (40 mg total) by mouth 2 (two) times daily before a meal. 60 tablet 0  . Polyethylene Glycol 400 (BLINK TEARS) 0.25 % SOLN Apply 1 drop to eye daily as needed (for eye irritation/dryness).     . topiramate (TOPAMAX) 100 MG tablet Take 3 tablets (300 mg total) by mouth at bedtime. 3 TABLETS AT BEDTIME (Patient taking differently: Take 100 mg by mouth at bedtime. 1 TABLET AT BEDTIME) 90 tablet 0   No current facility-administered medications for this visit.     Past Surgical History:  Procedure Laterality Date  . BIOPSY  11/14/2016   Procedure: BIOPSY;  Surgeon: Danie Binder, MD;  [facility];  Service: Endoscopy;;  colon gastric duodenal  . BREAST SURGERY Left 1994   Lumpectomy  . COLONOSCOPY WITH PROPOFOL N/A 11/14/2016   Dr. Oneida Alar: Moderately redundant rectosigmoid colon. Random colon biopsies benign. Internal hemorrhoids. Surveillance colonoscopy in 5 years.  . CORONARY STENT INTERVENTION N/A 03/22/2018   Procedure: CORONARY STENT INTERVENTION;  Surgeon: Burnell Blanks, MD;  Location: [place] [facility] [facility];  Service: Cardiovascular;  Laterality: N/A;  . ESOPHAGOGASTRODUODENOSCOPY (EGD) WITH PROPOFOL N/A 11/14/2016   Dr. Oneida Alar: Esophagus normal. Moderate gastritis, few gastric polyps. Duodenal biopsies negative. Fundic gland polyp gastric polyp area no H pylori.  Marland Kitchen FOOT SURGERY    . LEFT HEART CATH AND CORONARY ANGIOGRAPHY N/A 03/22/2018   Procedure: LEFT HEART CATH AND CORONARY ANGIOGRAPHY;  Surgeon: Burnell Blanks, MD;  Location: [place] [facility];  Service: Cardiovascular;  Laterality: N/A;  . LEG SURGERY Right   . REPLACEMENT TOTAL KNEE Right   . SPINAL CORD STIMULATOR IMPLANT       Allergies  Allergen Reactions  . Fentanyl Nausea And Vomiting and Other (See Comments)    Nausea, vomiting, loss of consciousness, requiring reversal      Family History  Problem Relation Age of Onset  . Cancer Mother 68       colon  . Cancer Father        renal cell  . Cancer Brother        bladder  . Cancer Sister        primary brain, and primary breast too  . Cancer Sister        breast   . Cancer Other        multiple aunts with breast,  cervical, or colon cancer     Social History Jodi Ray reports that she has never smoked. She has never used smokeless tobacco. Jodi Ray reports no history of alcohol use.   Review of Systems CONSTITUTIONAL: No weight loss, fever, chills, weakness or fatigue.  HEENT: Eyes: No visual loss,  blurred vision, double vision or yellow sclerae.No hearing loss, sneezing, congestion, runny nose or sore throat.  SKIN: No rash or itching.  CARDIOVASCULAR: per hpi RESPIRATORY: No shortness of breath, cough or sputum.  GASTROINTESTINAL: No anorexia, nausea, vomiting or diarrhea. No abdominal pain or blood.  GENITOURINARY: No burning on urination, no polyuria NEUROLOGICAL: No headache, dizziness, syncope, paralysis, ataxia, numbness or tingling in the extremities. No change in bowel or bladder control.  MUSCULOSKELETAL: No muscle, back pain, joint pain or stiffness.  LYMPHATICS: No enlarged nodes. No history of splenectomy.  PSYCHIATRIC: No history of depression or anxiety.  ENDOCRINOLOGIC: No reports of sweating, cold or heat intolerance. No polyuria or polydipsia.  Marland Kitchen   Physical Examination Today's Vitals   09/08/20 1002  BP: 136/72  Weight: 224 lb 8 oz (101.8 kg)  Height: 5\' 9"  (1.753 m)   Body mass index is 33.15 kg/m.  Gen: resting comfortably, no acute distress HEENT: no scleral icterus, pupils equal round and reactive, no palptable cervical adenopathy,  CV: RRR, no m/r/g, no jvd Resp: Clear to auscultation bilaterally GI: abdomen is soft, non-tender, non-distended, normal bowel sounds, no hepatosplenomegaly MSK: extremities are warm, no edema.  Skin: warm, no rash Neuro:  no focal deficits Psych: appropriate affect   Diagnostic Studies Echocardiogram on 07/17/2018 demonstrated normal left ventricular systolic function and regional wall motion, LVEF 60 to 65%, indeterminate grade diastolic dysfunction, moderate LVH, and mild left atrial dilatation.   Cardiac  catheterization 03/22/18:   Prox RCA lesion is 20% stenosed.  Prox LAD lesion is 80% stenosed.  A drug-eluting stent was successfully placed using a STENT SIERRA 3.00 X 18 MM.  Post intervention, there is a 0% residual stenosis.  The left ventricular systolic function is normal.  LV end diastolic pressure is normal.  The left ventricular ejection fraction is 55-65% by visual estimate.  There is no mitral valve regurgitation.  1. Severe stenosis proximal LAD 2. Successful PTCA/DES x 1 proximal LAD 3. Mild non-obstructive disease in the RCA\ 4. Normal LV systolic function  04/4075 echo 1. Left ventricular ejection fraction, by estimation, is 60 to 65%. The  left ventricle has normal function. The left ventricle has no regional  wall motion abnormalities. Left ventricular diastolic parameters are  consistent with Grade I diastolic  dysfunction (impaired relaxation).  2. Right ventricular systolic function is normal. The right ventricular  size is normal. Tricuspid regurgitation signal is inadequate for assessing  PA pressure.  3. The mitral valve is degenerative. No evidence of mitral valve  regurgitation. No evidence of mitral stenosis.  4. The aortic valve is tricuspid. Aortic valve regurgitation is not  visualized. No aortic stenosis is present.  5. The inferior vena cava is dilated in size with >50% respiratory  variability, suggesting right atrial pressure of 8 mmHg.     Assessment and Plan  1.  Coronary artery disease - no recent symptoms - tolerates regular high exertion with cycling 11 miles multple times a wee - continue current meds EKG today shows SR, chronic LBBB  2.  Hypertension - at goal, continue current meds  3.  Hyperlipidemia - continue statin, request pcp labs  F/u 6 months    Arnoldo Lenis, M.D.

## 2020-09-10 DIAGNOSIS — J309 Allergic rhinitis, unspecified: Secondary | ICD-10-CM | POA: Diagnosis not present

## 2020-09-13 DIAGNOSIS — J309 Allergic rhinitis, unspecified: Secondary | ICD-10-CM | POA: Diagnosis not present

## 2020-09-14 DIAGNOSIS — E785 Hyperlipidemia, unspecified: Secondary | ICD-10-CM | POA: Diagnosis not present

## 2020-09-14 DIAGNOSIS — Z9641 Presence of insulin pump (external) (internal): Secondary | ICD-10-CM | POA: Diagnosis not present

## 2020-09-14 DIAGNOSIS — E039 Hypothyroidism, unspecified: Secondary | ICD-10-CM | POA: Diagnosis not present

## 2020-09-14 DIAGNOSIS — I10 Essential (primary) hypertension: Secondary | ICD-10-CM | POA: Diagnosis not present

## 2020-09-14 DIAGNOSIS — E109 Type 1 diabetes mellitus without complications: Secondary | ICD-10-CM | POA: Diagnosis not present

## 2020-09-15 DIAGNOSIS — J309 Allergic rhinitis, unspecified: Secondary | ICD-10-CM | POA: Diagnosis not present

## 2020-09-17 DIAGNOSIS — J309 Allergic rhinitis, unspecified: Secondary | ICD-10-CM | POA: Diagnosis not present

## 2020-09-20 DIAGNOSIS — J309 Allergic rhinitis, unspecified: Secondary | ICD-10-CM | POA: Diagnosis not present

## 2020-09-22 DIAGNOSIS — J309 Allergic rhinitis, unspecified: Secondary | ICD-10-CM | POA: Diagnosis not present

## 2020-09-24 DIAGNOSIS — J309 Allergic rhinitis, unspecified: Secondary | ICD-10-CM | POA: Diagnosis not present

## 2020-09-27 DIAGNOSIS — J309 Allergic rhinitis, unspecified: Secondary | ICD-10-CM | POA: Diagnosis not present

## 2020-09-29 DIAGNOSIS — J309 Allergic rhinitis, unspecified: Secondary | ICD-10-CM | POA: Diagnosis not present

## 2020-10-01 DIAGNOSIS — J309 Allergic rhinitis, unspecified: Secondary | ICD-10-CM | POA: Diagnosis not present

## 2020-10-04 DIAGNOSIS — J309 Allergic rhinitis, unspecified: Secondary | ICD-10-CM | POA: Diagnosis not present

## 2020-10-06 DIAGNOSIS — J309 Allergic rhinitis, unspecified: Secondary | ICD-10-CM | POA: Diagnosis not present

## 2020-10-08 DIAGNOSIS — J309 Allergic rhinitis, unspecified: Secondary | ICD-10-CM | POA: Diagnosis not present

## 2020-10-11 DIAGNOSIS — J309 Allergic rhinitis, unspecified: Secondary | ICD-10-CM | POA: Diagnosis not present

## 2020-10-13 DIAGNOSIS — J309 Allergic rhinitis, unspecified: Secondary | ICD-10-CM | POA: Diagnosis not present

## 2020-10-15 DIAGNOSIS — J309 Allergic rhinitis, unspecified: Secondary | ICD-10-CM | POA: Diagnosis not present

## 2020-10-16 ENCOUNTER — Other Ambulatory Visit: Payer: Self-pay | Admitting: Family Medicine

## 2020-10-18 DIAGNOSIS — J309 Allergic rhinitis, unspecified: Secondary | ICD-10-CM | POA: Diagnosis not present

## 2020-10-20 DIAGNOSIS — J309 Allergic rhinitis, unspecified: Secondary | ICD-10-CM | POA: Diagnosis not present

## 2020-10-21 ENCOUNTER — Encounter: Payer: Self-pay | Admitting: Adult Health

## 2020-10-21 ENCOUNTER — Ambulatory Visit (INDEPENDENT_AMBULATORY_CARE_PROVIDER_SITE_OTHER): Payer: Medicare Other | Admitting: Adult Health

## 2020-10-21 VITALS — BP 145/71 | HR 79 | Ht 68.0 in | Wt 231.0 lb

## 2020-10-21 DIAGNOSIS — Z9989 Dependence on other enabling machines and devices: Secondary | ICD-10-CM | POA: Diagnosis not present

## 2020-10-21 DIAGNOSIS — G4733 Obstructive sleep apnea (adult) (pediatric): Secondary | ICD-10-CM | POA: Diagnosis not present

## 2020-10-21 DIAGNOSIS — E1040 Type 1 diabetes mellitus with diabetic neuropathy, unspecified: Secondary | ICD-10-CM | POA: Diagnosis not present

## 2020-10-21 DIAGNOSIS — I2510 Atherosclerotic heart disease of native coronary artery without angina pectoris: Secondary | ICD-10-CM | POA: Diagnosis not present

## 2020-10-21 DIAGNOSIS — I2583 Coronary atherosclerosis due to lipid rich plaque: Secondary | ICD-10-CM

## 2020-10-21 NOTE — Progress Notes (Addendum)
PATIENT: Jodi Ray DOB: [birth year]  REASON FOR VISIT: follow up HISTORY FROM: patient  HISTORY OF PRESENT ILLNESS: Today 10/21/20:  Jodi Ray is a 60 year old female with a history of obstructive sleep apnea on CPAP.  She returns today for follow-up.  We were unable to get a download today.  The patient has to take her machine to [place] in order for them to pull the data.  Reports that she will do that within the next week.  Reports that she uses the CPAP nightly.  She denies any new issues.  Reports that she does need a new filter and tubing.  Returns today for an evaluation.  10/22/19: Jodi Ray is a 60 year old female with a history of obstructive sleep apnea on CPAP.  Her download indicates that she use her machine nightly for compliance of 100%.  She use her machine greater than 4 hours each night.  On average she uses her machine 8 hours and 17 minutes.  Her residual AHI is 0.5 on 11.6 cm of water with EPR 2.  She reports that her CPAP is working well for her.  She does use a cloth mask and reports that she has a hard time getting this every 3 months from her DME company.  HISTORY (Copied from Dr.Dohmeier's note) 04-24-2019.  this patient returns a sleep study and is now on CPAP with good compliance.  She gets new supplies, a new sleep study was not ordered. The current settings are 11.6 cm water with 2 cm EPR, 100% compliance.  This is been better has used her machine every day of the last 30 days between 25 August on 23 April 2019 average usage time 7 hours and 43 minutes.  Her residual AHI is 0.6/h she does have moderate air leaks, she does have obstructive sleep apnea and rare central events.  Her Epworth Sleepiness Scale has been endorsed at only 2 points at her fatigue severity at 9 points her geriatric depression score was endorsed at 9 out of 15 points.  REVIEW OF SYSTEMS: Out of a complete 14 system review of symptoms, the patient complains only of the following  symptoms, and all other reviewed systems are negative. FSS 9 ESS 4   ALLERGIES: Allergies  Allergen Reactions  . Fentanyl Nausea And Vomiting and Other (See Comments)    Nausea, vomiting, loss of consciousness, requiring reversal    HOME MEDICATIONS: Outpatient Medications Prior to Visit  Medication Sig Dispense Refill  . aspirin EC 81 MG tablet Take 81 mg by mouth daily.    Marland Kitchen atorvastatin (LIPITOR) 80 MG tablet TAKE ONE TABLET BY MOUTH DAILY AT 6 PM 90 tablet 3  . carvedilol (COREG) 3.125 MG tablet TAKE ONE TABLET BY MOUTH 1 HOUR BEFORE CARDIAC REHAB 90 tablet 2  . Cholecalciferol (VITAMIN D-3) 5000 units TABS Take 5,000 Units by mouth daily.     . Coenzyme Q10 (COQ-10) 100 MG CAPS Take 1 capsule by mouth daily.    Marland Kitchen EPINEPHrine 0.3 mg/0.3 mL IJ SOAJ injection Inject 0.3 mg into the muscle as needed.  1  . famotidine (PEPCID) 20 MG tablet Take 1 tablet (20 mg total) by mouth at bedtime. 30 tablet 1  . furosemide (LASIX) 20 MG tablet Take 1 tablet (20 mg total) by mouth as needed for fluid.    . Galcanezumab-gnlm (EMGALITY) 120 MG/ML SOAJ Inject 120 mg into the skin every 30 (thirty) days. 1 pen 11  . insulin glargine (LANTUS) 100 UNIT/ML injection Inject  0.12 mLs (12 Units total) into the skin at bedtime for 1 dose. 10 mL 0  . Insulin Human (INSULIN PUMP) SOLN Inject into the skin continuous.     Marland Kitchen levothyroxine (SYNTHROID, LEVOTHROID) 125 MCG tablet Take 125 mcg by mouth daily before breakfast.   0  . lisinopril (ZESTRIL) 10 MG tablet Take 10 mg by mouth daily.    . metFORMIN (GLUCOPHAGE) 500 MG tablet Take 1 tablet (500 mg total) by mouth 2 (two) times daily with a meal.    . Multiple Vitamins-Minerals (CENTRUM SILVER 50+WOMEN) TABS Take 1 tablet by mouth daily.     . nitroGLYCERIN (NITROSTAT) 0.4 MG SL tablet Place 1 tablet (0.4 mg total) under the tongue every 5 (five) minutes as needed for chest pain. 25 tablet 3  . NOVOLOG 100 UNIT/ML injection Inject into the skin continuous.    0  . Omega-3 Fatty Acids (FISH OIL) 1000 MG CAPS Take 1,000 mg by mouth daily.    . OXYGEN Inhale into the lungs. As needed    . pantoprazole (PROTONIX) 40 MG tablet Take 1 tablet (40 mg total) by mouth 2 (two) times daily before a meal. 60 tablet 0  . Polyethylene Glycol 400 (BLINK TEARS) 0.25 % SOLN Apply 1 drop to eye daily as needed (for eye irritation/dryness).    . topiramate (TOPAMAX) 100 MG tablet Take 3 tablets (300 mg total) by mouth at bedtime. 3 TABLETS AT BEDTIME (Patient taking differently: Take 100 mg by mouth at bedtime. 1 TABLET AT BEDTIME) 90 tablet 0   No facility-administered medications prior to visit.    PAST MEDICAL HISTORY: Past Medical History:  Diagnosis Date  . 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 Anemia   . Brain aneurysm    frontal lobe  . CAD (coronary artery disease)    a. 02/2018: s/p DES to Proximal LAD with residual 20% RCA stenosis.  . Chronic pain   . Diabetes mellitus without complication ([place])   . Headache   . History of left bundle branch block (LBBB)   . HTN (hypertension)   . Hx of cardiovascular stress test 10/2016   intermediate risk study  . Hypothyroidism   . Neuromuscular disorder ([place])   . Neuropathy   . Obesity   . OSA on CPAP 03/10/2015  . Retinopathy   . Sleep apnea   . Varicose veins of both lower extremities     PAST SURGICAL HISTORY: Past Surgical History:  Procedure Laterality Date  . BIOPSY  11/14/2016   Procedure: BIOPSY;  Surgeon: Danie Binder, MD;  [facility];  Service: Endoscopy;;  colon gastric duodenal  . BREAST SURGERY Left 1994   Lumpectomy  . COLONOSCOPY WITH PROPOFOL N/A 11/14/2016   Dr. Oneida Alar: Moderately redundant rectosigmoid colon. Random colon biopsies benign. Internal hemorrhoids. Surveillance colonoscopy in 5 years.  . CORONARY STENT INTERVENTION N/A 03/22/2018   Procedure: CORONARY STENT INTERVENTION;  Surgeon: Burnell Blanks, MD;  Location: [facility] [facility];  Service: Cardiovascular;   Laterality: N/A;  . ESOPHAGOGASTRODUODENOSCOPY (EGD) WITH PROPOFOL N/A 11/14/2016   Dr. Oneida Alar: Esophagus normal. Moderate gastritis, few gastric polyps. Duodenal biopsies negative. Fundic gland polyp gastric polyp area no H pylori.  Marland Kitchen FOOT SURGERY    . LEFT HEART CATH AND CORONARY ANGIOGRAPHY N/A 03/22/2018   Procedure: LEFT HEART CATH AND CORONARY ANGIOGRAPHY;  Surgeon: Burnell Blanks, MD;  Location: [facility];  Service: Cardiovascular;  Laterality: N/A;  . LEG SURGERY Right   .  REPLACEMENT TOTAL KNEE Right   . SPINAL CORD STIMULATOR IMPLANT      FAMILY HISTORY: Family History  Problem Relation Age of Onset  . Cancer Mother 38       colon  . Cancer Father        renal cell  . Cancer Brother        bladder  . Cancer Sister        primary brain, and primary breast too  . Cancer Sister        breast   . Cancer Other        multiple aunts with breast, cervical, or colon cancer    SOCIAL HISTORY: Social History   Socioeconomic History  . Marital status: Widowed    Spouse name: Not on file  . Number of children: 0  . Years of education: Not on file  . Highest education level: Bachelor's degree (e.g., BA, AB, BS)  Occupational History  . Occupation: Retired  Tobacco Use  . Smoking status: Never Smoker  . Smokeless tobacco: Never Used  Vaping Use  . Vaping Use: Never used  Substance and Sexual Activity  . Alcohol use: No    Alcohol/week: 0.0 standard drinks  . Drug use: No  . Sexual activity: Yes    Partners: Male  Other Topics Concern  . Not on file  Social History Narrative   Drinks about 1 cup coffee day.   Cardiac rehab 3x week   One level home with significant other   Bachelors in Accounting      Right handed   No children   widow   Social Determinants of Health   Financial Resource Strain: Not on file  Food Insecurity: Not on file  Transportation Needs: Not on file  Physical Activity: Not on file  Stress: Not on file  Social  Connections: Not on file  Intimate Partner Violence: Not on file      PHYSICAL EXAM  Vitals:   10/21/20 0851  BP: (!) 145/71  Pulse: 79  Weight: 231 lb (104.8 kg)  Height: 5\' 8"  (1.727 m)   Body mass index is 35.12 kg/m.  Generalized: Well developed, in no acute distress  Chest: Lungs clear to auscultation bilaterally  Neurological examination  Mentation: Alert oriented to time, place, history taking. Follows all commands speech and language fluent Cranial nerve II-XII: Extraocular movements were full, visual field were full on confrontational test Head turning and shoulder shrug  were normal and symmetric. Motor: The motor testing reveals 5 over 5 strength of all 4 extremities. Good symmetric motor tone is noted throughout.  Sensory: Sensory testing is intact to soft touch on all 4 extremities. No evidence of extinction is noted.  Gait and station: Gait is normal.    DIAGNOSTIC DATA (LABS, IMAGING, TESTING) - I reviewed patient records, labs, notes, testing and imaging myself where available.  Lab Results  Component Value Date   WBC 4.1 05/13/2020   HGB 11.1 (L) 05/13/2020   HCT 33.5 (L) 05/13/2020   MCV 89.6 05/13/2020   PLT 218 05/13/2020      Component Value Date/Time   NA 137 06/23/2020 0816   K 4.7 06/23/2020 0816   CL 103 06/23/2020 0816   CO2 24 06/23/2020 0816   GLUCOSE 94 06/23/2020 0816   BUN 30 (H) 06/23/2020 0816   CREATININE 1.05 (H) 06/23/2020 0816   CREATININE 0.98 07/23/2015 0707   CALCIUM 9.4 06/23/2020 0816   CALCIUM 9.3 02/04/2020 1216  PROT 6.8 11/15/2019 1137   ALBUMIN 3.8 06/23/2020 0816   AST 24 11/15/2019 1137   ALT 32 11/15/2019 1137   ALKPHOS 63 11/15/2019 1137   BILITOT 1.1 11/15/2019 1137   GFRNONAA >60 06/23/2020 0816   GFRAA >60 02/04/2020 1216   Lab Results  Component Value Date   CHOL 134 04/16/2018   HDL 75 04/16/2018   LDLCALC 54 04/16/2018   TRIG 23 04/16/2018   CHOLHDL 1.8 04/16/2018   Lab Results  Component  Value Date   HGBA1C 7.8 (H) 11/15/2019   No results found for: VITAMINB12 Lab Results  Component Value Date   TSH 0.647 12/03/2018      ASSESSMENT AND PLAN 60 y.o. year old female  has a past medical history of Adult RDS ([place]), Anemia, Brain aneurysm, CAD (coronary artery disease), Chronic pain, Diabetes mellitus without complication ([place]), Headache, History of left bundle branch block (LBBB), HTN (hypertension), cardiovascular stress test (10/2016), Hypothyroidism, Neuromuscular disorder ([place]), Neuropathy, Obesity, OSA on CPAP (03/10/2015), Retinopathy, Sleep apnea, and Varicose veins of both lower extremities. here with:  1. OSA on CPAP  - CPAP compliance excellent - Good treatment of AHI  - Encourage patient to use CPAP nightly and > 4 hours each night -Order placed for new new tubing and filter - F/U in 1 year or sooner if needed   I spent 25 minutes of face-to-face and non-face-to-face time with patient.  This included previsit chart review, lab review, study review, order entry, electronic health record documentation, patient education.  Ward Givens, MSN, NP-C 10/21/2020, 8:50 AM [facility] [address], [place], [place] [address] [phone]

## 2020-10-21 NOTE — Patient Instructions (Signed)
Continue using CPAP nightly and greater than 4 hours each night Take machine to DME company in order for them to pull the data for [place] to review Order sent for new supplies-filter and tubing If your symptoms worsen or you develop new symptoms please let us know.

## 2020-10-22 DIAGNOSIS — J309 Allergic rhinitis, unspecified: Secondary | ICD-10-CM | POA: Diagnosis not present

## 2020-10-25 DIAGNOSIS — J309 Allergic rhinitis, unspecified: Secondary | ICD-10-CM | POA: Diagnosis not present

## 2020-10-26 ENCOUNTER — Telehealth: Payer: Self-pay | Admitting: Adult Health

## 2020-10-26 NOTE — Telephone Encounter (Signed)
Patient only needs tubing and filter.  I corrected my note to reflect that

## 2020-10-26 NOTE — Telephone Encounter (Signed)
PYI: Pt called, CPAP was not programmed correctly. I am at [facility] and their reprogramming my CPAP machine.

## 2020-10-27 DIAGNOSIS — J309 Allergic rhinitis, unspecified: Secondary | ICD-10-CM | POA: Diagnosis not present

## 2020-10-27 NOTE — Telephone Encounter (Signed)
Pt returned call. Please call back when available. 

## 2020-10-27 NOTE — Telephone Encounter (Signed)
Sent CM for pt to [facility] for new supplies for cpap.

## 2020-10-27 NOTE — Telephone Encounter (Signed)
I called pt and LMVM for her that got message, MM/NP aware of it as well.  Order to [facility] was for tubing and filter.

## 2020-10-27 NOTE — Telephone Encounter (Signed)
Download shows excellent compliance.  Her residual AHI was 0.9 on 11 cm of water.  With no significant leak.  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

## 2020-10-27 NOTE — Telephone Encounter (Signed)
Spoke to pt.  She had [facility] program her cpap machine.  They need orders for her, I will send CM to them  Once you look at download in your inbox.

## 2020-10-29 DIAGNOSIS — J309 Allergic rhinitis, unspecified: Secondary | ICD-10-CM | POA: Diagnosis not present

## 2020-11-01 DIAGNOSIS — J309 Allergic rhinitis, unspecified: Secondary | ICD-10-CM | POA: Diagnosis not present

## 2020-11-03 DIAGNOSIS — J309 Allergic rhinitis, unspecified: Secondary | ICD-10-CM | POA: Diagnosis not present

## 2020-11-05 ENCOUNTER — Other Ambulatory Visit (HOSPITAL_COMMUNITY)
Admission: RE | Admit: 2020-11-05 | Discharge: 2020-11-05 | Disposition: A | Discharge status: Home / Self Care | Payer: Medicare Other | Source: Ambulatory Visit | Attending: Nephrology | Admitting: Nephrology

## 2020-11-05 ENCOUNTER — Other Ambulatory Visit: Payer: Self-pay

## 2020-11-05 DIAGNOSIS — J309 Allergic rhinitis, unspecified: Secondary | ICD-10-CM | POA: Diagnosis not present

## 2020-11-05 DIAGNOSIS — E6609 Other obesity due to excess calories: Secondary | ICD-10-CM | POA: Diagnosis not present

## 2020-11-05 DIAGNOSIS — I129 Hypertensive chronic kidney disease with stage 1 through stage 4 chronic kidney disease, or unspecified chronic kidney disease: Secondary | ICD-10-CM | POA: Insufficient documentation

## 2020-11-05 DIAGNOSIS — N170 Acute kidney failure with tubular necrosis: Secondary | ICD-10-CM | POA: Diagnosis not present

## 2020-11-05 DIAGNOSIS — R809 Proteinuria, unspecified: Secondary | ICD-10-CM | POA: Insufficient documentation

## 2020-11-05 DIAGNOSIS — E1122 Type 2 diabetes mellitus with diabetic chronic kidney disease: Secondary | ICD-10-CM | POA: Insufficient documentation

## 2020-11-05 DIAGNOSIS — E871 Hypo-osmolality and hyponatremia: Secondary | ICD-10-CM | POA: Insufficient documentation

## 2020-11-05 DIAGNOSIS — E1129 Type 2 diabetes mellitus with other diabetic kidney complication: Secondary | ICD-10-CM | POA: Diagnosis not present

## 2020-11-05 DIAGNOSIS — N189 Chronic kidney disease, unspecified: Secondary | ICD-10-CM | POA: Diagnosis not present

## 2020-11-05 LAB — RENAL FUNCTION PANEL
Albumin: 3.9 g/dL (ref 3.5–5.0)
Anion gap: 9 (ref 5–15)
BUN: 41 mg/dL — ABNORMAL HIGH (ref 8–23)
CO2: 25 mmol/L (ref 22–32)
Calcium: 9.4 mg/dL (ref 8.9–10.3)
Chloride: 105 mmol/L (ref 98–111)
Creatinine, Ser: 1.27 mg/dL — ABNORMAL HIGH (ref 0.44–1.00)
GFR, Estimated: 48 mL/min — ABNORMAL LOW (ref >60)
Glucose, Bld: 162 mg/dL — ABNORMAL HIGH (ref 70–99)
Phosphorus: 3.5 mg/dL (ref 2.5–4.6)
Potassium: 5.2 mmol/L — ABNORMAL HIGH (ref 3.5–5.1)
Sodium: 139 mmol/L (ref 135–145)

## 2020-11-05 LAB — CBC
HCT: 33.7 % — ABNORMAL LOW (ref 36.0–46.0)
Hemoglobin: 10.6 g/dL — ABNORMAL LOW (ref 12.0–15.0)
MCH: 29.7 pg (ref 26.0–34.0)
MCHC: 31.5 g/dL (ref 30.0–36.0)
MCV: 94.4 fL (ref 80.0–100.0)
Platelets: 209 10*3/uL (ref 150–400)
RBC: 3.57 MIL/uL — ABNORMAL LOW (ref 3.87–5.11)
RDW: 14.6 % (ref 11.5–15.5)
WBC: 4.8 10*3/uL (ref 4.0–10.5)
nRBC: 0 % (ref 0.0–0.2)

## 2020-11-06 LAB — MICROALBUMIN / CREATININE URINE RATIO
Creatinine, Urine: 28.5 mg/dL
Microalb Creat Ratio: 46 mg/g creat — ABNORMAL HIGH (ref 0–29)
Microalb, Ur: 13.2 ug/mL — ABNORMAL HIGH

## 2020-11-08 DIAGNOSIS — J309 Allergic rhinitis, unspecified: Secondary | ICD-10-CM | POA: Diagnosis not present

## 2020-11-10 DIAGNOSIS — J309 Allergic rhinitis, unspecified: Secondary | ICD-10-CM | POA: Diagnosis not present

## 2020-11-12 DIAGNOSIS — J309 Allergic rhinitis, unspecified: Secondary | ICD-10-CM | POA: Diagnosis not present

## 2020-11-15 DIAGNOSIS — J309 Allergic rhinitis, unspecified: Secondary | ICD-10-CM | POA: Diagnosis not present

## 2020-11-17 ENCOUNTER — Other Ambulatory Visit (HOSPITAL_COMMUNITY): Payer: Self-pay | Admitting: Nephrology

## 2020-11-17 DIAGNOSIS — J309 Allergic rhinitis, unspecified: Secondary | ICD-10-CM | POA: Diagnosis not present

## 2020-11-17 DIAGNOSIS — R600 Localized edema: Secondary | ICD-10-CM

## 2020-11-17 DIAGNOSIS — I129 Hypertensive chronic kidney disease with stage 1 through stage 4 chronic kidney disease, or unspecified chronic kidney disease: Secondary | ICD-10-CM

## 2020-11-17 DIAGNOSIS — E1122 Type 2 diabetes mellitus with diabetic chronic kidney disease: Secondary | ICD-10-CM | POA: Diagnosis not present

## 2020-11-17 DIAGNOSIS — E875 Hyperkalemia: Secondary | ICD-10-CM | POA: Diagnosis not present

## 2020-11-17 DIAGNOSIS — N189 Chronic kidney disease, unspecified: Secondary | ICD-10-CM | POA: Diagnosis not present

## 2020-11-17 DIAGNOSIS — E6609 Other obesity due to excess calories: Secondary | ICD-10-CM | POA: Diagnosis not present

## 2020-11-17 DIAGNOSIS — R809 Proteinuria, unspecified: Secondary | ICD-10-CM | POA: Diagnosis not present

## 2020-11-17 DIAGNOSIS — E1129 Type 2 diabetes mellitus with other diabetic kidney complication: Secondary | ICD-10-CM | POA: Diagnosis not present

## 2020-11-18 ENCOUNTER — Ambulatory Visit (HOSPITAL_COMMUNITY)
Admission: RE | Admit: 2020-11-18 | Discharge: 2020-11-18 | Disposition: A | Discharge status: Home / Self Care | Payer: Medicare Other | Source: Ambulatory Visit | Attending: Nephrology | Admitting: Nephrology

## 2020-11-18 ENCOUNTER — Other Ambulatory Visit: Payer: Self-pay

## 2020-11-18 DIAGNOSIS — R600 Localized edema: Secondary | ICD-10-CM | POA: Diagnosis not present

## 2020-11-18 DIAGNOSIS — I129 Hypertensive chronic kidney disease with stage 1 through stage 4 chronic kidney disease, or unspecified chronic kidney disease: Secondary | ICD-10-CM | POA: Diagnosis not present

## 2020-11-18 LAB — ECHOCARDIOGRAM COMPLETE
AR max vel: 1.55 cm2
AV Area VTI: 1.73 cm2
AV Area mean vel: 1.6 cm2
AV Mean grad: 8 mmHg
AV Peak grad: 15.2 mmHg
Ao pk vel: 1.95 m/s
Area-P 1/2: 3.5 cm2
S' Lateral: 2.8 cm

## 2020-11-18 NOTE — Progress Notes (Signed)
*  PRELIMINARY RESULTS* Echocardiogram 2D Echocardiogram has been performed.  Jodi Ray 11/18/2020, 4:20 PM

## 2020-11-19 DIAGNOSIS — J309 Allergic rhinitis, unspecified: Secondary | ICD-10-CM | POA: Diagnosis not present

## 2020-11-22 DIAGNOSIS — J309 Allergic rhinitis, unspecified: Secondary | ICD-10-CM | POA: Diagnosis not present

## 2020-11-24 DIAGNOSIS — J309 Allergic rhinitis, unspecified: Secondary | ICD-10-CM | POA: Diagnosis not present

## 2020-11-26 DIAGNOSIS — J309 Allergic rhinitis, unspecified: Secondary | ICD-10-CM | POA: Diagnosis not present

## 2020-11-29 ENCOUNTER — Other Ambulatory Visit: Payer: Self-pay

## 2020-11-29 ENCOUNTER — Other Ambulatory Visit (HOSPITAL_COMMUNITY)
Admission: RE | Admit: 2020-11-29 | Discharge: 2020-11-29 | Disposition: A | Discharge status: Home / Self Care | Payer: Medicare Other | Source: Ambulatory Visit | Attending: Nephrology | Admitting: Nephrology

## 2020-11-29 DIAGNOSIS — E1122 Type 2 diabetes mellitus with diabetic chronic kidney disease: Secondary | ICD-10-CM | POA: Diagnosis not present

## 2020-11-29 DIAGNOSIS — N189 Chronic kidney disease, unspecified: Secondary | ICD-10-CM | POA: Insufficient documentation

## 2020-11-29 DIAGNOSIS — R809 Proteinuria, unspecified: Secondary | ICD-10-CM | POA: Diagnosis not present

## 2020-11-29 DIAGNOSIS — I129 Hypertensive chronic kidney disease with stage 1 through stage 4 chronic kidney disease, or unspecified chronic kidney disease: Secondary | ICD-10-CM | POA: Insufficient documentation

## 2020-11-29 DIAGNOSIS — J309 Allergic rhinitis, unspecified: Secondary | ICD-10-CM | POA: Diagnosis not present

## 2020-11-29 DIAGNOSIS — E6609 Other obesity due to excess calories: Secondary | ICD-10-CM | POA: Diagnosis not present

## 2020-11-29 DIAGNOSIS — E1129 Type 2 diabetes mellitus with other diabetic kidney complication: Secondary | ICD-10-CM | POA: Insufficient documentation

## 2020-11-29 DIAGNOSIS — R600 Localized edema: Secondary | ICD-10-CM | POA: Insufficient documentation

## 2020-11-29 DIAGNOSIS — E875 Hyperkalemia: Secondary | ICD-10-CM | POA: Insufficient documentation

## 2020-11-29 LAB — CBC
HCT: 37.5 % (ref 36.0–46.0)
Hemoglobin: 12 g/dL (ref 12.0–15.0)
MCH: 30.3 pg (ref 26.0–34.0)
MCHC: 32 g/dL (ref 30.0–36.0)
MCV: 94.7 fL (ref 80.0–100.0)
Platelets: 222 10*3/uL (ref 150–400)
RBC: 3.96 MIL/uL (ref 3.87–5.11)
RDW: 13.8 % (ref 11.5–15.5)
WBC: 5.2 10*3/uL (ref 4.0–10.5)
nRBC: 0 % (ref 0.0–0.2)

## 2020-11-29 LAB — RENAL FUNCTION PANEL
Albumin: 4.3 g/dL (ref 3.5–5.0)
Anion gap: 10 (ref 5–15)
BUN: 64 mg/dL — ABNORMAL HIGH (ref 8–23)
CO2: 26 mmol/L (ref 22–32)
Calcium: 9.5 mg/dL (ref 8.9–10.3)
Chloride: 100 mmol/L (ref 98–111)
Creatinine, Ser: 1.66 mg/dL — ABNORMAL HIGH (ref 0.44–1.00)
GFR, Estimated: 35 mL/min — ABNORMAL LOW (ref >60)
Glucose, Bld: 163 mg/dL — ABNORMAL HIGH (ref 70–99)
Phosphorus: 3.6 mg/dL (ref 2.5–4.6)
Potassium: 4.1 mmol/L (ref 3.5–5.1)
Sodium: 136 mmol/L (ref 135–145)

## 2020-11-29 LAB — PROTEIN / CREATININE RATIO, URINE
Creatinine, Urine: 33.47 mg/dL
Total Protein, Urine: <6 mg/dL

## 2020-12-01 DIAGNOSIS — J309 Allergic rhinitis, unspecified: Secondary | ICD-10-CM | POA: Diagnosis not present

## 2020-12-02 DIAGNOSIS — R809 Proteinuria, unspecified: Secondary | ICD-10-CM | POA: Diagnosis not present

## 2020-12-02 DIAGNOSIS — I129 Hypertensive chronic kidney disease with stage 1 through stage 4 chronic kidney disease, or unspecified chronic kidney disease: Secondary | ICD-10-CM | POA: Diagnosis not present

## 2020-12-02 DIAGNOSIS — E1122 Type 2 diabetes mellitus with diabetic chronic kidney disease: Secondary | ICD-10-CM | POA: Diagnosis not present

## 2020-12-02 DIAGNOSIS — E875 Hyperkalemia: Secondary | ICD-10-CM | POA: Diagnosis not present

## 2020-12-02 DIAGNOSIS — I5032 Chronic diastolic (congestive) heart failure: Secondary | ICD-10-CM | POA: Diagnosis not present

## 2020-12-02 DIAGNOSIS — N189 Chronic kidney disease, unspecified: Secondary | ICD-10-CM | POA: Diagnosis not present

## 2020-12-02 DIAGNOSIS — E1129 Type 2 diabetes mellitus with other diabetic kidney complication: Secondary | ICD-10-CM | POA: Diagnosis not present

## 2020-12-02 DIAGNOSIS — R600 Localized edema: Secondary | ICD-10-CM | POA: Diagnosis not present

## 2020-12-03 DIAGNOSIS — J309 Allergic rhinitis, unspecified: Secondary | ICD-10-CM | POA: Diagnosis not present

## 2020-12-06 DIAGNOSIS — J309 Allergic rhinitis, unspecified: Secondary | ICD-10-CM | POA: Diagnosis not present

## 2020-12-08 DIAGNOSIS — J309 Allergic rhinitis, unspecified: Secondary | ICD-10-CM | POA: Diagnosis not present

## 2020-12-10 DIAGNOSIS — J309 Allergic rhinitis, unspecified: Secondary | ICD-10-CM | POA: Diagnosis not present

## 2020-12-13 DIAGNOSIS — J309 Allergic rhinitis, unspecified: Secondary | ICD-10-CM | POA: Diagnosis not present

## 2020-12-15 DIAGNOSIS — J309 Allergic rhinitis, unspecified: Secondary | ICD-10-CM | POA: Diagnosis not present

## 2020-12-17 DIAGNOSIS — J309 Allergic rhinitis, unspecified: Secondary | ICD-10-CM | POA: Diagnosis not present

## 2020-12-20 DIAGNOSIS — J309 Allergic rhinitis, unspecified: Secondary | ICD-10-CM | POA: Diagnosis not present

## 2020-12-22 DIAGNOSIS — J309 Allergic rhinitis, unspecified: Secondary | ICD-10-CM | POA: Diagnosis not present

## 2020-12-24 DIAGNOSIS — E039 Hypothyroidism, unspecified: Secondary | ICD-10-CM | POA: Diagnosis not present

## 2020-12-24 DIAGNOSIS — Z6833 Body mass index (BMI) 33.0-33.9, adult: Secondary | ICD-10-CM | POA: Diagnosis not present

## 2020-12-24 DIAGNOSIS — Z1331 Encounter for screening for depression: Secondary | ICD-10-CM | POA: Diagnosis not present

## 2020-12-24 DIAGNOSIS — E7800 Pure hypercholesterolemia, unspecified: Secondary | ICD-10-CM | POA: Diagnosis not present

## 2020-12-24 DIAGNOSIS — I10 Essential (primary) hypertension: Secondary | ICD-10-CM | POA: Diagnosis not present

## 2020-12-24 DIAGNOSIS — R52 Pain, unspecified: Secondary | ICD-10-CM | POA: Diagnosis not present

## 2020-12-24 DIAGNOSIS — Z0000 Encounter for general adult medical examination without abnormal findings: Secondary | ICD-10-CM | POA: Diagnosis not present

## 2020-12-24 DIAGNOSIS — J309 Allergic rhinitis, unspecified: Secondary | ICD-10-CM | POA: Diagnosis not present

## 2020-12-24 DIAGNOSIS — Z1339 Encounter for screening examination for other mental health and behavioral disorders: Secondary | ICD-10-CM | POA: Diagnosis not present

## 2020-12-24 DIAGNOSIS — Z299 Encounter for prophylactic measures, unspecified: Secondary | ICD-10-CM | POA: Diagnosis not present

## 2020-12-24 DIAGNOSIS — Z7189 Other specified counseling: Secondary | ICD-10-CM | POA: Diagnosis not present

## 2020-12-24 DIAGNOSIS — R5383 Other fatigue: Secondary | ICD-10-CM | POA: Diagnosis not present

## 2020-12-27 DIAGNOSIS — J309 Allergic rhinitis, unspecified: Secondary | ICD-10-CM | POA: Diagnosis not present

## 2020-12-27 NOTE — Progress Notes (Signed)
Cardiology Office Note  Date: 12/28/2020   ID: Navdeep, Fessenden 1961/07/14, MRN [id number]  PCP:  Monico Blitz, MD  Cardiologist:  Kate Sable, MD (Inactive) Electrophysiologist:  None   Chief Complaint: Elevated BP  History of Present Illness: Jodi Ray is a 60 y.o. female with a history of CAD, DM 1, anemia, LBBB, HTN.  Previous DES to proximal LAD 2019.  Echo December 2019 EF 60 to 65%.  Last seen by Dr. Harl Bowie 09/08/2020.  Previous history of chronic nonspecific chest pain/chest pressure at very high levels of exertion.  She was compliant with her medications.  She was cycling 3 days a week and weight training 4 days a week.  She was compliant with her antihypertensive medications.  She was using her CPAP machine.  She was following with endocrinology for DM1.  She was following Dr. Theador Hawthorne for CKD stage III.  History of chronic left bundle branch block. BP was at goal and continuing current medications. She was continuing statin medication. Labs were requested from PCP.  She is here today with some complaints of recent elevated blood pressures.  She had a recent echocardiogram ordered by nephrology per her statement.  Echocardiogram on 11/18/2020 demonstrated EF of 60 to 65%.  No WMA's, G1 DD.  Severely dilated LA, mildly dilated RA.  States she is having some issues with her blood sugars.  She is a type I diabetic and uses an insulin pump.  She states she has been exercising religiously 4-5 times per week.  She was concerned about the echocardiogram and we discussed the findings.  Blood pressure today is 128/64.  She states she does not ordinarily check her blood pressures at home.  She states her nephrologist recently started on diuretic which was recently decreased in dosage.  She is currently taking furosemide 20 mg daily.  She states her renal function had recently worsened and he took her off of her aspirin.   Past Medical History:  Diagnosis Date  . Adult RDS  ([place])   . Anemia   . Brain aneurysm    frontal lobe  . CAD (coronary artery disease)    a. 02/2018: s/p DES to Proximal LAD with residual 20% RCA stenosis.  . Chronic pain   . Diabetes mellitus without complication ([place])   . Headache   . History of left bundle branch block (LBBB)   . HTN (hypertension)   . Hx of cardiovascular stress test 10/2016   intermediate risk study  . Hypothyroidism   . Neuromuscular disorder ([place])   . Neuropathy   . Obesity   . OSA on CPAP 03/10/2015  . Retinopathy   . Sleep apnea   . Varicose veins of both lower extremities     Past Surgical History:  Procedure Laterality Date  . BIOPSY  11/14/2016   Procedure: BIOPSY;  Surgeon: Danie Binder, MD;  [facility]: [facility];  Service: Endoscopy;;  colon gastric duodenal  . BREAST SURGERY Left 1994   Lumpectomy  . COLONOSCOPY WITH PROPOFOL N/A 11/14/2016   Dr. Oneida Alar: Moderately redundant rectosigmoid colon. Random colon biopsies benign. Internal hemorrhoids. Surveillance colonoscopy in 5 years.  . CORONARY STENT INTERVENTION N/A 03/22/2018   Procedure: CORONARY STENT INTERVENTION;  Surgeon: Burnell Blanks, MD;  Location: [facility];  Service: Cardiovascular;  Laterality: N/A;  . ESOPHAGOGASTRODUODENOSCOPY (EGD) WITH PROPOFOL N/A 11/14/2016   Dr. Oneida Alar: Esophagus normal. Moderate gastritis, few gastric polyps. Duodenal biopsies negative. Fundic gland polyp gastric  polyp area no H pylori.  Marland Kitchen FOOT SURGERY    . LEFT HEART CATH AND CORONARY ANGIOGRAPHY N/A 03/22/2018   Procedure: LEFT HEART CATH AND CORONARY ANGIOGRAPHY;  Surgeon: Burnell Blanks, MD;  Location: [facility];  Service: Cardiovascular;  Laterality: N/A;  . LEG SURGERY Right   . REPLACEMENT TOTAL KNEE Right   . SPINAL CORD STIMULATOR IMPLANT      Current Outpatient Medications  Medication Sig Dispense Refill  . aspirin EC 81 MG tablet Take 81 mg by mouth daily.    Marland Kitchen atorvastatin (LIPITOR) 80 MG tablet TAKE  ONE TABLET BY MOUTH DAILY AT 6 PM 90 tablet 3  . carvedilol (COREG) 3.125 MG tablet TAKE ONE TABLET BY MOUTH 1 HOUR BEFORE CARDIAC REHAB 90 tablet 2  . Cholecalciferol (VITAMIN D-3) 5000 units TABS Take 5,000 Units by mouth daily.     . Coenzyme Q10 (COQ-10) 100 MG CAPS Take 1 capsule by mouth daily.    Marland Kitchen EPINEPHrine 0.3 mg/0.3 mL IJ SOAJ injection Inject 0.3 mg into the muscle as needed.  1  . furosemide (LASIX) 20 MG tablet Take 1 tablet by mouth daily.    . insulin glargine (LANTUS) 100 UNIT/ML injection Inject 0.12 mLs (12 Units total) into the skin at bedtime for 1 dose. 10 mL 0  . Insulin Human (INSULIN PUMP) SOLN Inject into the skin continuous.     Marland Kitchen levothyroxine (SYNTHROID, LEVOTHROID) 125 MCG tablet Take 125 mcg by mouth daily before breakfast.   0  . lisinopril (ZESTRIL) 10 MG tablet Take 10 mg by mouth daily.    . metFORMIN (GLUCOPHAGE) 500 MG tablet Take 1 tablet (500 mg total) by mouth 2 (two) times daily with a meal.    . Multiple Vitamins-Minerals (CENTRUM SILVER 50+WOMEN) TABS Take 1 tablet by mouth daily.     . nitroGLYCERIN (NITROSTAT) 0.4 MG SL tablet Place 1 tablet (0.4 mg total) under the tongue every 5 (five) minutes as needed for chest pain. 25 tablet 3  . NOVOLOG 100 UNIT/ML injection Inject into the skin continuous.   0  . Omega-3 Fatty Acids (FISH OIL) 1000 MG CAPS Take 1,000 mg by mouth daily.    . OXYGEN Inhale into the lungs. As needed     No current facility-administered medications for this visit.   Allergies:  Fentanyl   Social History: The patient  reports that she has never smoked. She has never used smokeless tobacco. She reports that she does not drink alcohol and does not use drugs.   Family History: The patient's family history includes Cancer in her brother, father, sister, sister, and another family member; Cancer (age of onset: 71) in her mother.   ROS:  Please see the history of present illness. Otherwise, complete review of systems is positive for  none.  All other systems are reviewed and negative.   Physical Exam: VS:  BP 128/64   Pulse 83   Ht 5\' 8"  (1.727 m)   Wt 233 lb (105.7 kg) Comment: declined weight here - weight was at pcp office  SpO2 98%   BMI 35.43 kg/m , BMI Body mass index is 35.43 kg/m.  Wt Readings from Last 3 Encounters:  12/28/20 233 lb (105.7 kg)  10/21/20 231 lb (104.8 kg)  09/08/20 224 lb 8 oz (101.8 kg)    General: Patient appears comfortable at rest. Neck: Supple, no elevated JVP or carotid bruits, no thyromegaly. Lungs: Clear to auscultation, nonlabored breathing at rest. Cardiac: Regular  rate and rhythm, no S3 or significant systolic murmur, no pericardial rub. Extremities: No pitting edema, distal pulses 2+. Skin: Warm and dry. Musculoskeletal: No kyphosis. Neuropsychiatric: Alert and oriented x3, affect grossly appropriate.  ECG:  EKG September 08, 2020 normal sinus rhythm rate of 92, nonspecific intraventricular block, possible lateral infarct, age undetermined.  Recent Labwork: 11/29/2020: BUN 64; Creatinine, Ser 1.66; Hemoglobin 12.0; Platelets 222; Potassium 4.1; Sodium 136     Component Value Date/Time   CHOL 134 04/16/2018 0815   TRIG 23 04/16/2018 0815   HDL 75 04/16/2018 0815   CHOLHDL 1.8 04/16/2018 0815   VLDL 5 04/16/2018 0815   LDLCALC 54 04/16/2018 0815    Other Studies Reviewed Today:  Echocardiogram 11/18/2020  1. Left ventricular ejection fraction, by estimation, is 60 to 65%. The left ventricle has normal function. The left ventricle has no regional wall motion abnormalities. Left ventricular diastolic parameters are consistent with Grade I diastolic dysfunction (impaired relaxation). 2. Right ventricular systolic function is normal. The right ventricular size is normal. 3. Left atrial size was severely dilated. 4. Right atrial size was mildly dilated. 5. The mitral valve is normal in structure. No evidence of mitral valve regurgitation. No evidence of mitral  stenosis. 6. The aortic valve is tricuspid. There is mild calcification of the aortic valve. There is mild thickening of the aortic valve. Aortic valve regurgitation is not visualized. No aortic stenosis is present. 7. The inferior vena cava is normal in size with greater than 50% respiratory variability, suggesting right atrial pressure of 3 mmHg     Echocardiogram on 07/17/2018 demonstrated normal left ventricular systolic function and regional wall motion, LVEF 60 to 65%, indeterminate grade diastolic dysfunction, moderate LVH, and mild left atrial dilatation.   Cardiac catheterization 03/22/18:   Prox RCA lesion is 20% stenosed.  Prox LAD lesion is 80% stenosed.  A drug-eluting stent was successfully placed using a STENT SIERRA 3.00 X 18 MM.  Post intervention, there is a 0% residual stenosis.  The left ventricular systolic function is normal.  LV end diastolic pressure is normal.  The left ventricular ejection fraction is 55-65% by visual estimate.  There is no mitral valve regurgitation.  1. Severe stenosis proximal LAD 2. Successful PTCA/DES x 1 proximal LAD 3. Mild non-obstructive disease in the RCA\ 4. Normal LV systolic function  01/6545 echo 1. Left ventricular ejection fraction, by estimation, is 60 to 65%. The  left ventricle has normal function. The left ventricle has no regional  wall motion abnormalities. Left ventricular diastolic parameters are  consistent with Grade I diastolic  dysfunction (impaired relaxation).  2. Right ventricular systolic function is normal. The right ventricular  size is normal. Tricuspid regurgitation signal is inadequate for assessing  PA pressure.  3. The mitral valve is degenerative. No evidence of mitral valve  regurgitation. No evidence of mitral stenosis.  4. The aortic valve is tricuspid. Aortic valve regurgitation is not  visualized. No aortic stenosis is present.  5. The inferior vena cava is dilated in size  with >50% respiratory  variability, suggesting right atrial pressure of 8 mmHg.     Assessment and Plan:  1. CAD in native artery   2. Essential hypertension   3. Mixed hyperlipidemia   4. Stage 3 chronic kidney disease, unspecified whether stage 3a or 3b CKD ([place])    1. CAD in native artery Denies any anginal or exertional symptoms.  Nephrology recently stopped her aspirin due to worsening renal function.  Continue sublingual  nitroglycerin as needed.  2. Essential hypertension Patient states she recently had some elevated blood pressures at nephrology office visits. Blood pressure well controlled at 128/64 today.  Continue carvedilol 3.125 mg p.o. twice daily.  Continue Lasix 20 mg p.o. daily.  Continue lisinopril 10 mg daily.  Advised patient goal of blood pressure is 130/80 or less consistently.  Advised to check her blood pressure 2-3 times per week and call if sustained blood pressures at or above 130/80.  3. Mixed hyperlipidemia Continue atorvastatin 80 mg p.o. daily.   4.  Stage III chronic kidney disease Continue to follow with Dr. Theador Hawthorne continue to follow.  Recent creatinine 1.66 and GFR 35.  Medication Adjustments/Labs and Tests Ordered: Current medicines are reviewed at length with the patient today.  Concerns regarding medicines are outlined above.   Disposition: Follow-up with Dr. Harl Bowie or [facility] 6 months  Signed, Levell July, NP 12/28/2020 9:36 AM    [facility] [place], [place], [place] [address] Phone: [phone]; Fax: [phone]

## 2020-12-28 ENCOUNTER — Ambulatory Visit (INDEPENDENT_AMBULATORY_CARE_PROVIDER_SITE_OTHER): Payer: Medicare Other | Admitting: Family Medicine

## 2020-12-28 ENCOUNTER — Encounter: Payer: Self-pay | Admitting: Family Medicine

## 2020-12-28 ENCOUNTER — Other Ambulatory Visit: Payer: Self-pay

## 2020-12-28 VITALS — BP 128/64 | HR 83 | Ht 68.0 in | Wt 233.0 lb

## 2020-12-28 DIAGNOSIS — E109 Type 1 diabetes mellitus without complications: Secondary | ICD-10-CM | POA: Diagnosis not present

## 2020-12-28 DIAGNOSIS — I2510 Atherosclerotic heart disease of native coronary artery without angina pectoris: Secondary | ICD-10-CM | POA: Diagnosis not present

## 2020-12-28 DIAGNOSIS — M1460 Charcot's joint, unspecified site: Secondary | ICD-10-CM | POA: Diagnosis not present

## 2020-12-28 DIAGNOSIS — I10 Essential (primary) hypertension: Secondary | ICD-10-CM | POA: Diagnosis not present

## 2020-12-28 DIAGNOSIS — Z9641 Presence of insulin pump (external) (internal): Secondary | ICD-10-CM | POA: Diagnosis not present

## 2020-12-28 DIAGNOSIS — E039 Hypothyroidism, unspecified: Secondary | ICD-10-CM | POA: Diagnosis not present

## 2020-12-28 DIAGNOSIS — E782 Mixed hyperlipidemia: Secondary | ICD-10-CM

## 2020-12-28 DIAGNOSIS — N183 Chronic kidney disease, stage 3 unspecified: Secondary | ICD-10-CM

## 2020-12-28 DIAGNOSIS — E11319 Type 2 diabetes mellitus with unspecified diabetic retinopathy without macular edema: Secondary | ICD-10-CM | POA: Diagnosis not present

## 2020-12-28 DIAGNOSIS — E669 Obesity, unspecified: Secondary | ICD-10-CM | POA: Diagnosis not present

## 2020-12-28 DIAGNOSIS — E118 Type 2 diabetes mellitus with unspecified complications: Secondary | ICD-10-CM | POA: Diagnosis not present

## 2020-12-28 DIAGNOSIS — Z794 Long term (current) use of insulin: Secondary | ICD-10-CM | POA: Diagnosis not present

## 2020-12-28 DIAGNOSIS — E1140 Type 2 diabetes mellitus with diabetic neuropathy, unspecified: Secondary | ICD-10-CM | POA: Diagnosis not present

## 2020-12-28 DIAGNOSIS — E785 Hyperlipidemia, unspecified: Secondary | ICD-10-CM | POA: Diagnosis not present

## 2020-12-28 NOTE — Patient Instructions (Signed)
Your physician recommends that you schedule a follow-up appointment in: 6 MONTHS WITH DR BRANCH  Your physician recommends that you continue on your current medications as directed. Please refer to the Current Medication list given to you today.  Thank you for choosing [facility]!!    

## 2020-12-29 ENCOUNTER — Other Ambulatory Visit (HOSPITAL_COMMUNITY)
Admission: RE | Admit: 2020-12-29 | Discharge: 2020-12-29 | Disposition: A | Discharge status: Home / Self Care | Payer: Medicare Other | Source: Ambulatory Visit | Attending: Nephrology | Admitting: Nephrology

## 2020-12-29 DIAGNOSIS — R600 Localized edema: Secondary | ICD-10-CM | POA: Diagnosis not present

## 2020-12-29 DIAGNOSIS — E875 Hyperkalemia: Secondary | ICD-10-CM | POA: Insufficient documentation

## 2020-12-29 DIAGNOSIS — E1129 Type 2 diabetes mellitus with other diabetic kidney complication: Secondary | ICD-10-CM | POA: Diagnosis not present

## 2020-12-29 DIAGNOSIS — N189 Chronic kidney disease, unspecified: Secondary | ICD-10-CM | POA: Insufficient documentation

## 2020-12-29 DIAGNOSIS — R809 Proteinuria, unspecified: Secondary | ICD-10-CM | POA: Insufficient documentation

## 2020-12-29 DIAGNOSIS — I129 Hypertensive chronic kidney disease with stage 1 through stage 4 chronic kidney disease, or unspecified chronic kidney disease: Secondary | ICD-10-CM | POA: Insufficient documentation

## 2020-12-29 DIAGNOSIS — E1122 Type 2 diabetes mellitus with diabetic chronic kidney disease: Secondary | ICD-10-CM | POA: Diagnosis not present

## 2020-12-29 DIAGNOSIS — J309 Allergic rhinitis, unspecified: Secondary | ICD-10-CM | POA: Diagnosis not present

## 2020-12-29 DIAGNOSIS — I5032 Chronic diastolic (congestive) heart failure: Secondary | ICD-10-CM | POA: Diagnosis not present

## 2020-12-29 LAB — RENAL FUNCTION PANEL
Albumin: 4.1 g/dL (ref 3.5–5.0)
Anion gap: 8 (ref 5–15)
BUN: 74 mg/dL — ABNORMAL HIGH (ref 8–23)
CO2: 27 mmol/L (ref 22–32)
Calcium: 9.5 mg/dL (ref 8.9–10.3)
Chloride: 101 mmol/L (ref 98–111)
Creatinine, Ser: 1.84 mg/dL — ABNORMAL HIGH (ref 0.44–1.00)
GFR, Estimated: 31 mL/min — ABNORMAL LOW (ref >60)
Glucose, Bld: 122 mg/dL — ABNORMAL HIGH (ref 70–99)
Phosphorus: 4.7 mg/dL — ABNORMAL HIGH (ref 2.5–4.6)
Potassium: 4.8 mmol/L (ref 3.5–5.1)
Sodium: 136 mmol/L (ref 135–145)

## 2020-12-29 LAB — CBC
HCT: 36 % (ref 36.0–46.0)
Hemoglobin: 11.3 g/dL — ABNORMAL LOW (ref 12.0–15.0)
MCH: 29.7 pg (ref 26.0–34.0)
MCHC: 31.4 g/dL (ref 30.0–36.0)
MCV: 94.7 fL (ref 80.0–100.0)
Platelets: 235 10*3/uL (ref 150–400)
RBC: 3.8 MIL/uL — ABNORMAL LOW (ref 3.87–5.11)
RDW: 13.6 % (ref 11.5–15.5)
WBC: 5.1 10*3/uL (ref 4.0–10.5)
nRBC: 0 % (ref 0.0–0.2)

## 2020-12-29 LAB — PROTEIN / CREATININE RATIO, URINE
Creatinine, Urine: 26.95 mg/dL
Total Protein, Urine: <6 mg/dL

## 2020-12-31 DIAGNOSIS — I5032 Chronic diastolic (congestive) heart failure: Secondary | ICD-10-CM | POA: Diagnosis not present

## 2020-12-31 DIAGNOSIS — I129 Hypertensive chronic kidney disease with stage 1 through stage 4 chronic kidney disease, or unspecified chronic kidney disease: Secondary | ICD-10-CM | POA: Diagnosis not present

## 2020-12-31 DIAGNOSIS — J309 Allergic rhinitis, unspecified: Secondary | ICD-10-CM | POA: Diagnosis not present

## 2020-12-31 DIAGNOSIS — E1122 Type 2 diabetes mellitus with diabetic chronic kidney disease: Secondary | ICD-10-CM | POA: Diagnosis not present

## 2020-12-31 DIAGNOSIS — N170 Acute kidney failure with tubular necrosis: Secondary | ICD-10-CM | POA: Diagnosis not present

## 2020-12-31 DIAGNOSIS — E1129 Type 2 diabetes mellitus with other diabetic kidney complication: Secondary | ICD-10-CM | POA: Diagnosis not present

## 2020-12-31 DIAGNOSIS — N189 Chronic kidney disease, unspecified: Secondary | ICD-10-CM | POA: Diagnosis not present

## 2020-12-31 DIAGNOSIS — R809 Proteinuria, unspecified: Secondary | ICD-10-CM | POA: Diagnosis not present

## 2021-01-03 DIAGNOSIS — J309 Allergic rhinitis, unspecified: Secondary | ICD-10-CM | POA: Diagnosis not present

## 2021-01-05 DIAGNOSIS — J309 Allergic rhinitis, unspecified: Secondary | ICD-10-CM | POA: Diagnosis not present

## 2021-01-07 DIAGNOSIS — J309 Allergic rhinitis, unspecified: Secondary | ICD-10-CM | POA: Diagnosis not present

## 2021-01-10 ENCOUNTER — Other Ambulatory Visit (HOSPITAL_COMMUNITY)
Admission: RE | Admit: 2021-01-10 | Discharge: 2021-01-10 | Disposition: A | Discharge status: Home / Self Care | Payer: Medicare Other | Source: Ambulatory Visit | Attending: Nephrology | Admitting: Nephrology

## 2021-01-10 DIAGNOSIS — E875 Hyperkalemia: Secondary | ICD-10-CM | POA: Insufficient documentation

## 2021-01-10 DIAGNOSIS — J309 Allergic rhinitis, unspecified: Secondary | ICD-10-CM | POA: Diagnosis not present

## 2021-01-10 DIAGNOSIS — R600 Localized edema: Secondary | ICD-10-CM | POA: Diagnosis not present

## 2021-01-10 DIAGNOSIS — E1129 Type 2 diabetes mellitus with other diabetic kidney complication: Secondary | ICD-10-CM | POA: Insufficient documentation

## 2021-01-10 DIAGNOSIS — R809 Proteinuria, unspecified: Secondary | ICD-10-CM | POA: Diagnosis not present

## 2021-01-10 DIAGNOSIS — I5032 Chronic diastolic (congestive) heart failure: Secondary | ICD-10-CM | POA: Diagnosis not present

## 2021-01-10 DIAGNOSIS — N189 Chronic kidney disease, unspecified: Secondary | ICD-10-CM | POA: Diagnosis not present

## 2021-01-10 DIAGNOSIS — I129 Hypertensive chronic kidney disease with stage 1 through stage 4 chronic kidney disease, or unspecified chronic kidney disease: Secondary | ICD-10-CM | POA: Diagnosis not present

## 2021-01-10 DIAGNOSIS — E1122 Type 2 diabetes mellitus with diabetic chronic kidney disease: Secondary | ICD-10-CM | POA: Diagnosis not present

## 2021-01-10 LAB — RENAL FUNCTION PANEL
Albumin: 3.7 g/dL (ref 3.5–5.0)
Anion gap: 7 (ref 5–15)
BUN: 24 mg/dL — ABNORMAL HIGH (ref 8–23)
CO2: 26 mmol/L (ref 22–32)
Calcium: 9 mg/dL (ref 8.9–10.3)
Chloride: 104 mmol/L (ref 98–111)
Creatinine, Ser: 0.95 mg/dL (ref 0.44–1.00)
GFR, Estimated: >60 mL/min (ref >60)
Glucose, Bld: 163 mg/dL — ABNORMAL HIGH (ref 70–99)
Phosphorus: 3.7 mg/dL (ref 2.5–4.6)
Potassium: 4.4 mmol/L (ref 3.5–5.1)
Sodium: 137 mmol/L (ref 135–145)

## 2021-01-11 LAB — ANCA TITERS
Atypical P-ANCA titer: <1:20 {titer}
C-ANCA: <1:20 {titer}
P-ANCA: <1:20 {titer}

## 2021-01-11 LAB — C4 COMPLEMENT: Complement C4, Body Fluid: 20 mg/dL (ref 12–38)

## 2021-01-11 LAB — ANA: Anti Nuclear Antibody (ANA): POSITIVE — AB

## 2021-01-11 LAB — C3 COMPLEMENT: C3 Complement: 122 mg/dL (ref 82–167)

## 2021-01-12 DIAGNOSIS — J309 Allergic rhinitis, unspecified: Secondary | ICD-10-CM | POA: Diagnosis not present

## 2021-01-14 DIAGNOSIS — J309 Allergic rhinitis, unspecified: Secondary | ICD-10-CM | POA: Diagnosis not present

## 2021-01-14 NOTE — Progress Notes (Signed)
NEUROLOGY FOLLOW UP OFFICE NOTE  Jodi Ray [id number]  Assessment/Plan:   Episodic cluster headache Hypertension OSA  Preventative management:  Emgality Abortive therapy:  100% O2 15 L/min for 15-20 minutes Limit use of pain relievers to no more than 2 days out of week to prevent risk of rebound or medication-overuse headache. Keep headache diary Follow up with PCP regarding blood pressure Continue CPAP Follow up 6 months.  Subjective:  Jodi Ray is a 60 year old handed woman with chronic pain related to complex regional pain syndrome (with spinal stimulator), hypertension, type 1 diabetes mellitus with polyneuropathy, OSA on CPAP, hypothyroidism, polymyalgia rheumatica who follows up for cluster headache.   UPDATE: Tapered off of topiramate.  doing well. Intensity:  severe Duration:  15 minutes with O2 Frequency:  4 times a week.  Previously less frequent.  She thinks increased frequency due to allergies.  Headaches seem to occur with change in barometric pressure (such as right before a storm).   3 or 4 a week during a storm.  Otherwise, no more than 1 a week.   Also, now she reports dizziness with her cluster headaches.  She has fallen twice due to the dizziness. She doesn't think the 300mg  daily of topiramate is helping.  ss frequent and intense.  They are manageable.    Current NSAIDs:  ASA 81mg  daily. Current analgesics:  none Current antihypertensive:  Coreg Current anti-epileptic:  none Current anti-CGRP:  Emgality Supplements:  Melatonin 20mg ; CoQ10 100mg ; D3 Other therapy:  100% O2 Other medications:  Synthroid Using CPAP   HISTORY: I  Primary Stabbing Headache: Onset:  2011.  She was previously treated in [place], where she was diagnosed with primary stabbing headache. Location:  Right frontal region Quality:  stabbing Intensity:  10/10 Aura:  no Prodrome:  no Associated symptoms:  Some nausea if severe. No autonomic  symptoms. Duration:  10 to 60 minutes Frequency:  4 times a week Triggers/exacerbating factors:  change in weather. Relieving factors:  none Activity:  Cannot function when experiencing it.   II Cluster headache:  She began having new intractable headaches in late Decemeber 2019.  Her right eye gets bloodshot and right nare runs.  Right side of face gets red.  They stabbing headache starts above the right eye and radiates, constant for a month, fluctuating in intensity.  She went to [facility] ED on 08/11/18.  CT of head and CTA of head and neck were performed and personally reviewed, showing no acute abnormality or aneurysm.  Sed rate from 08/22/18 was 24.  Past management:  Prednisone taper, headache cocktail               III  Migraines:  They are bi-frontal/maxillary and associated with slight nausea.  She has had this before when her allergies "act up."     IV  Diabetic neuropathy:  Secondary to Type 1 diabetes.   V  Complex regional pain syndrome:  following an accident where she fell down the steps and crushed her right leg.  She takes gabapentin, tramadol and has a spinal nerve stimulator.  She takes this for her painful diabetic neuropathy as well.   VI  Right arm pain and numbness:  Since 2015, she has had episodes of right arm pain and numbness.  It only occurs when she is laying in bed, and occurs no matter what position.  Her entire arm goes "dead".  It is both numb, painful and unable to move  it.  There is no shooting pain down the arm from the neck, however she gets occasional shooting pain from the right side of her neck into the shoulder.  She has to use her other arm to shake it out and it resolves in a minute or two.  It occurs every night.  She was sent to pain management for possible cervical radiculopathy or thoracic outlet syndrome.  She did receive injections, which helped.  She had an MRI of the cervical and thoracic spine on 06/12/14.  Imaging not available, but report  mentions diffuse facet arthropathy and degenerative changes in the cervical spine, but no nerve root impingement or cord compression.  There was no evidence of thoracic outlet syndrome.  She was sent to [facility] for further evaluation.  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  They told her that her symptoms were related to her diabetic neuropathy.   VII  Cerebral aneurysm:  In 2009, she had a CTA of the head which reportedly showed a 1-29mm an eurysm at the junction of right A1 and A2 segment of the ACA.  However, a repeat CTA performed on 02/22/12 did not reveal any aneurysm.     She cannot have an MRI due to spinal stimulator.   Past medictions:   NSAIDs:  Indomethacin 25mg  three times daily (effective but discontinued due to elevated liver enzymes) Past analgesics:  Lidocaine nasal (lost efficacy) Past anthypertensive medication:  Verapamil (ineffective), lisinopril Past antiepileptics:  Trokendi XR (effective but expensive), topiramate, gabapentin  PAST MEDICAL HISTORY: Past Medical History:  Diagnosis Date   Adult RDS ([place])    Anemia    Brain aneurysm    frontal lobe   CAD (coronary artery disease)    a. 02/2018: s/p DES to Proximal LAD with residual 20% RCA stenosis.   Chronic pain    Diabetes mellitus without complication ([facility])    Headache    History of left bundle branch block (LBBB)    HTN (hypertension)    Hx of cardiovascular stress test 10/2016   intermediate risk study   Hypothyroidism    Neuromuscular disorder ([facility])    Neuropathy    Obesity    OSA on CPAP 03/10/2015   Retinopathy    Sleep apnea    Varicose veins of both lower extremities     MEDICATIONS: Current Outpatient Medications on File Prior to Visit  Medication Sig Dispense Refill   aspirin EC 81 MG tablet Take 81 mg by mouth daily.     atorvastatin (LIPITOR) 80 MG  tablet TAKE ONE TABLET BY MOUTH DAILY AT 6 PM 90 tablet 3   carvedilol (COREG) 3.125 MG tablet TAKE ONE TABLET BY MOUTH 1 HOUR BEFORE CARDIAC REHAB 90 tablet 2   Cholecalciferol (VITAMIN D-3) 5000 units TABS Take 5,000 Units by mouth daily.      Coenzyme Q10 (COQ-10) 100 MG CAPS Take 1 capsule by mouth daily.     EPINEPHrine 0.3 mg/0.3 mL IJ SOAJ injection Inject 0.3 mg into the muscle as needed.  1   furosemide (LASIX) 20 MG tablet Take 1 tablet by mouth daily.     insulin glargine (LANTUS) 100 UNIT/ML injection Inject 0.12 mLs (12 Units total) into the skin at bedtime for 1 dose. 10 mL 0   Insulin Human (INSULIN PUMP) SOLN Inject into the skin continuous.      levothyroxine (SYNTHROID,  LEVOTHROID) 125 MCG tablet Take 125 mcg by mouth daily before breakfast.   0   lisinopril (ZESTRIL) 10 MG tablet Take 10 mg by mouth daily.     metFORMIN (GLUCOPHAGE) 500 MG tablet Take 1 tablet (500 mg total) by mouth 2 (two) times daily with a meal.     Multiple Vitamins-Minerals (CENTRUM SILVER 50+WOMEN) TABS Take 1 tablet by mouth daily.      nitroGLYCERIN (NITROSTAT) 0.4 MG SL tablet Place 1 tablet (0.4 mg total) under the tongue every 5 (five) minutes as needed for chest pain. 25 tablet 3   NOVOLOG 100 UNIT/ML injection Inject into the skin continuous.   0   Omega-3 Fatty Acids (FISH OIL) 1000 MG CAPS Take 1,000 mg by mouth daily.     OXYGEN Inhale into the lungs. As needed     No current facility-administered medications on file prior to visit.    ALLERGIES: Allergies  Allergen Reactions   Fentanyl Nausea And Vomiting and Other (See Comments)    Nausea, vomiting, loss of consciousness, requiring reversal    FAMILY HISTORY: Family History  Problem Relation Age of Onset   Cancer Mother 28       colon   Cancer Father        renal cell   Cancer Brother        bladder   Cancer Sister        primary brain, and primary breast too   Cancer Sister        breast    Cancer Other        multiple  aunts with breast, cervical, or colon cancer      Objective:  Blood pressure (!) 180/74, pulse 84, height 5\' 9"  (1.753 m), weight 235 lb (106.6 kg), SpO2 96 %. General: No acute distress.  Patient appears well-groomed.   Head:  Normocephalic/atraumatic Eyes:  Fundi examined but not visualized Neck: supple, no paraspinal tenderness, full range of motion Heart:  Regular rate and rhythm Lungs:  Clear to auscultation bilaterally Back: No paraspinal tenderness Neurological Exam: alert and oriented to person, place, and time. Speech fluent and not dysarthric, language intact.  CN II-XII intact. Bulk and tone normal, muscle strength 5/5 throughout.  Sensation to light touch  intact.  Deep tendon reflexes 2+ throughout.  Finger to nose testing intact.  Cautious gait with cane     Metta Clines, DO  CC: Monico Blitz, MD

## 2021-01-17 ENCOUNTER — Other Ambulatory Visit: Payer: Self-pay

## 2021-01-17 ENCOUNTER — Encounter: Payer: Self-pay | Admitting: Neurology

## 2021-01-17 ENCOUNTER — Ambulatory Visit (INDEPENDENT_AMBULATORY_CARE_PROVIDER_SITE_OTHER): Payer: Medicare Other | Admitting: Neurology

## 2021-01-17 VITALS — BP 180/74 | HR 84 | Ht 69.0 in | Wt 235.0 lb

## 2021-01-17 DIAGNOSIS — I10 Essential (primary) hypertension: Secondary | ICD-10-CM | POA: Diagnosis not present

## 2021-01-17 DIAGNOSIS — J309 Allergic rhinitis, unspecified: Secondary | ICD-10-CM | POA: Diagnosis not present

## 2021-01-17 DIAGNOSIS — G44019 Episodic cluster headache, not intractable: Secondary | ICD-10-CM | POA: Diagnosis not present

## 2021-01-17 DIAGNOSIS — I2510 Atherosclerotic heart disease of native coronary artery without angina pectoris: Secondary | ICD-10-CM | POA: Diagnosis not present

## 2021-01-17 DIAGNOSIS — Z9989 Dependence on other enabling machines and devices: Secondary | ICD-10-CM | POA: Diagnosis not present

## 2021-01-17 DIAGNOSIS — G4733 Obstructive sleep apnea (adult) (pediatric): Secondary | ICD-10-CM

## 2021-01-17 MED ORDER — EMGALITY 120 MG/ML ~~LOC~~ SOAJ: 120.0000 mg | SUBCUTANEOUS | 5 refills | Status: DC

## 2021-01-17 NOTE — Patient Instructions (Signed)
Continue Emgality every 28 days Use Oxygen as needed Follow up 6 months.

## 2021-01-18 DIAGNOSIS — E1140 Type 2 diabetes mellitus with diabetic neuropathy, unspecified: Secondary | ICD-10-CM | POA: Diagnosis not present

## 2021-01-18 DIAGNOSIS — E1151 Type 2 diabetes mellitus with diabetic peripheral angiopathy without gangrene: Secondary | ICD-10-CM | POA: Diagnosis not present

## 2021-01-19 DIAGNOSIS — J309 Allergic rhinitis, unspecified: Secondary | ICD-10-CM | POA: Diagnosis not present

## 2021-01-21 DIAGNOSIS — J309 Allergic rhinitis, unspecified: Secondary | ICD-10-CM | POA: Diagnosis not present

## 2021-01-24 DIAGNOSIS — J309 Allergic rhinitis, unspecified: Secondary | ICD-10-CM | POA: Diagnosis not present

## 2021-01-25 DIAGNOSIS — I129 Hypertensive chronic kidney disease with stage 1 through stage 4 chronic kidney disease, or unspecified chronic kidney disease: Secondary | ICD-10-CM | POA: Diagnosis not present

## 2021-01-25 DIAGNOSIS — R600 Localized edema: Secondary | ICD-10-CM | POA: Diagnosis not present

## 2021-01-25 DIAGNOSIS — R768 Other specified abnormal immunological findings in serum: Secondary | ICD-10-CM | POA: Diagnosis not present

## 2021-01-25 DIAGNOSIS — E6609 Other obesity due to excess calories: Secondary | ICD-10-CM | POA: Diagnosis not present

## 2021-01-25 DIAGNOSIS — E1129 Type 2 diabetes mellitus with other diabetic kidney complication: Secondary | ICD-10-CM | POA: Diagnosis not present

## 2021-01-25 DIAGNOSIS — R809 Proteinuria, unspecified: Secondary | ICD-10-CM | POA: Diagnosis not present

## 2021-01-25 DIAGNOSIS — I5033 Acute on chronic diastolic (congestive) heart failure: Secondary | ICD-10-CM | POA: Diagnosis not present

## 2021-01-25 DIAGNOSIS — N170 Acute kidney failure with tubular necrosis: Secondary | ICD-10-CM | POA: Diagnosis not present

## 2021-01-26 DIAGNOSIS — J309 Allergic rhinitis, unspecified: Secondary | ICD-10-CM | POA: Diagnosis not present

## 2021-01-27 ENCOUNTER — Other Ambulatory Visit: Payer: Self-pay | Admitting: Internal Medicine

## 2021-01-27 DIAGNOSIS — Z139 Encounter for screening, unspecified: Secondary | ICD-10-CM

## 2021-01-28 ENCOUNTER — Ambulatory Visit
Admission: RE | Admit: 2021-01-28 | Discharge: 2021-01-28 | Disposition: A | Discharge status: Home / Self Care | Payer: Medicare Other | Source: Ambulatory Visit | Attending: Internal Medicine | Admitting: Internal Medicine

## 2021-01-28 ENCOUNTER — Other Ambulatory Visit: Payer: Self-pay

## 2021-01-28 DIAGNOSIS — Z139 Encounter for screening, unspecified: Secondary | ICD-10-CM

## 2021-01-28 DIAGNOSIS — Z1231 Encounter for screening mammogram for malignant neoplasm of breast: Secondary | ICD-10-CM | POA: Diagnosis not present

## 2021-01-28 DIAGNOSIS — J309 Allergic rhinitis, unspecified: Secondary | ICD-10-CM | POA: Diagnosis not present

## 2021-01-31 DIAGNOSIS — J309 Allergic rhinitis, unspecified: Secondary | ICD-10-CM | POA: Diagnosis not present

## 2021-02-02 DIAGNOSIS — J309 Allergic rhinitis, unspecified: Secondary | ICD-10-CM | POA: Diagnosis not present

## 2021-02-04 ENCOUNTER — Other Ambulatory Visit: Payer: Self-pay | Admitting: Internal Medicine

## 2021-02-04 DIAGNOSIS — J309 Allergic rhinitis, unspecified: Secondary | ICD-10-CM | POA: Diagnosis not present

## 2021-02-04 DIAGNOSIS — R928 Other abnormal and inconclusive findings on diagnostic imaging of breast: Secondary | ICD-10-CM

## 2021-02-07 DIAGNOSIS — J309 Allergic rhinitis, unspecified: Secondary | ICD-10-CM | POA: Diagnosis not present

## 2021-02-09 ENCOUNTER — Other Ambulatory Visit (HOSPITAL_COMMUNITY)
Admission: RE | Admit: 2021-02-09 | Discharge: 2021-02-09 | Disposition: A | Discharge status: Home / Self Care | Payer: Medicare Other | Source: Ambulatory Visit | Attending: Nephrology | Admitting: Nephrology

## 2021-02-09 DIAGNOSIS — E1129 Type 2 diabetes mellitus with other diabetic kidney complication: Secondary | ICD-10-CM | POA: Diagnosis not present

## 2021-02-09 DIAGNOSIS — N170 Acute kidney failure with tubular necrosis: Secondary | ICD-10-CM | POA: Diagnosis not present

## 2021-02-09 DIAGNOSIS — E1122 Type 2 diabetes mellitus with diabetic chronic kidney disease: Secondary | ICD-10-CM | POA: Diagnosis not present

## 2021-02-09 DIAGNOSIS — J309 Allergic rhinitis, unspecified: Secondary | ICD-10-CM | POA: Diagnosis not present

## 2021-02-09 DIAGNOSIS — I5032 Chronic diastolic (congestive) heart failure: Secondary | ICD-10-CM | POA: Insufficient documentation

## 2021-02-09 DIAGNOSIS — N189 Chronic kidney disease, unspecified: Secondary | ICD-10-CM | POA: Insufficient documentation

## 2021-02-09 DIAGNOSIS — R809 Proteinuria, unspecified: Secondary | ICD-10-CM | POA: Diagnosis not present

## 2021-02-09 DIAGNOSIS — I129 Hypertensive chronic kidney disease with stage 1 through stage 4 chronic kidney disease, or unspecified chronic kidney disease: Secondary | ICD-10-CM | POA: Insufficient documentation

## 2021-02-09 LAB — RENAL FUNCTION PANEL
Albumin: 3.9 g/dL (ref 3.5–5.0)
Anion gap: 9 (ref 5–15)
BUN: 37 mg/dL — ABNORMAL HIGH (ref 8–23)
CO2: 26 mmol/L (ref 22–32)
Calcium: 8.9 mg/dL (ref 8.9–10.3)
Chloride: 101 mmol/L (ref 98–111)
Creatinine, Ser: 1.26 mg/dL — ABNORMAL HIGH (ref 0.44–1.00)
GFR, Estimated: 48 mL/min — ABNORMAL LOW (ref >60)
Glucose, Bld: 147 mg/dL — ABNORMAL HIGH (ref 70–99)
Phosphorus: 4 mg/dL (ref 2.5–4.6)
Potassium: 4.6 mmol/L (ref 3.5–5.1)
Sodium: 136 mmol/L (ref 135–145)

## 2021-02-10 LAB — ANCA TITERS
Atypical P-ANCA titer: <1:20 {titer}
C-ANCA: <1:20 {titer}
P-ANCA: <1:20 {titer}

## 2021-02-10 LAB — ANA: Anti Nuclear Antibody (ANA): POSITIVE — AB

## 2021-02-10 LAB — C4 COMPLEMENT: Complement C4, Body Fluid: 17 mg/dL (ref 12–38)

## 2021-02-10 LAB — C3 COMPLEMENT: C3 Complement: 120 mg/dL (ref 82–167)

## 2021-02-11 DIAGNOSIS — J309 Allergic rhinitis, unspecified: Secondary | ICD-10-CM | POA: Diagnosis not present

## 2021-02-14 DIAGNOSIS — J309 Allergic rhinitis, unspecified: Secondary | ICD-10-CM | POA: Diagnosis not present

## 2021-02-16 DIAGNOSIS — I10 Essential (primary) hypertension: Secondary | ICD-10-CM | POA: Diagnosis not present

## 2021-02-16 DIAGNOSIS — E1165 Type 2 diabetes mellitus with hyperglycemia: Secondary | ICD-10-CM | POA: Diagnosis not present

## 2021-02-16 DIAGNOSIS — J309 Allergic rhinitis, unspecified: Secondary | ICD-10-CM | POA: Diagnosis not present

## 2021-02-16 DIAGNOSIS — E1122 Type 2 diabetes mellitus with diabetic chronic kidney disease: Secondary | ICD-10-CM | POA: Diagnosis not present

## 2021-02-16 DIAGNOSIS — N2581 Secondary hyperparathyroidism of renal origin: Secondary | ICD-10-CM | POA: Diagnosis not present

## 2021-02-16 DIAGNOSIS — Z6838 Body mass index (BMI) 38.0-38.9, adult: Secondary | ICD-10-CM | POA: Diagnosis not present

## 2021-02-16 DIAGNOSIS — Z299 Encounter for prophylactic measures, unspecified: Secondary | ICD-10-CM | POA: Diagnosis not present

## 2021-02-17 DIAGNOSIS — E559 Vitamin D deficiency, unspecified: Secondary | ICD-10-CM | POA: Diagnosis not present

## 2021-02-17 DIAGNOSIS — H838X3 Other specified diseases of inner ear, bilateral: Secondary | ICD-10-CM | POA: Diagnosis not present

## 2021-02-17 DIAGNOSIS — R5383 Other fatigue: Secondary | ICD-10-CM | POA: Diagnosis not present

## 2021-02-17 DIAGNOSIS — H900 Conductive hearing loss, bilateral: Secondary | ICD-10-CM | POA: Diagnosis not present

## 2021-02-17 DIAGNOSIS — H6121 Impacted cerumen, right ear: Secondary | ICD-10-CM | POA: Diagnosis not present

## 2021-02-17 DIAGNOSIS — E039 Hypothyroidism, unspecified: Secondary | ICD-10-CM | POA: Diagnosis not present

## 2021-02-17 DIAGNOSIS — Z79899 Other long term (current) drug therapy: Secondary | ICD-10-CM | POA: Diagnosis not present

## 2021-02-17 DIAGNOSIS — E7800 Pure hypercholesterolemia, unspecified: Secondary | ICD-10-CM | POA: Diagnosis not present

## 2021-02-18 DIAGNOSIS — R809 Proteinuria, unspecified: Secondary | ICD-10-CM | POA: Diagnosis not present

## 2021-02-18 DIAGNOSIS — J309 Allergic rhinitis, unspecified: Secondary | ICD-10-CM | POA: Diagnosis not present

## 2021-02-18 DIAGNOSIS — E1129 Type 2 diabetes mellitus with other diabetic kidney complication: Secondary | ICD-10-CM | POA: Diagnosis not present

## 2021-02-18 DIAGNOSIS — I129 Hypertensive chronic kidney disease with stage 1 through stage 4 chronic kidney disease, or unspecified chronic kidney disease: Secondary | ICD-10-CM | POA: Diagnosis not present

## 2021-02-18 DIAGNOSIS — R600 Localized edema: Secondary | ICD-10-CM | POA: Diagnosis not present

## 2021-02-18 DIAGNOSIS — I5033 Acute on chronic diastolic (congestive) heart failure: Secondary | ICD-10-CM | POA: Diagnosis not present

## 2021-02-18 DIAGNOSIS — N189 Chronic kidney disease, unspecified: Secondary | ICD-10-CM | POA: Diagnosis not present

## 2021-02-18 DIAGNOSIS — E1122 Type 2 diabetes mellitus with diabetic chronic kidney disease: Secondary | ICD-10-CM | POA: Diagnosis not present

## 2021-02-21 DIAGNOSIS — J309 Allergic rhinitis, unspecified: Secondary | ICD-10-CM | POA: Diagnosis not present

## 2021-02-23 ENCOUNTER — Other Ambulatory Visit: Payer: Self-pay

## 2021-02-23 ENCOUNTER — Ambulatory Visit
Admission: RE | Admit: 2021-02-23 | Discharge: 2021-02-23 | Disposition: A | Discharge status: Home / Self Care | Payer: Medicare Other | Source: Ambulatory Visit | Attending: Internal Medicine | Admitting: Internal Medicine

## 2021-02-23 ENCOUNTER — Ambulatory Visit: Payer: Medicare Other

## 2021-02-23 DIAGNOSIS — J309 Allergic rhinitis, unspecified: Secondary | ICD-10-CM | POA: Diagnosis not present

## 2021-02-23 DIAGNOSIS — R922 Inconclusive mammogram: Secondary | ICD-10-CM | POA: Diagnosis not present

## 2021-02-23 DIAGNOSIS — R928 Other abnormal and inconclusive findings on diagnostic imaging of breast: Secondary | ICD-10-CM

## 2021-02-25 DIAGNOSIS — J309 Allergic rhinitis, unspecified: Secondary | ICD-10-CM | POA: Diagnosis not present

## 2021-02-28 DIAGNOSIS — J309 Allergic rhinitis, unspecified: Secondary | ICD-10-CM | POA: Diagnosis not present

## 2021-03-02 DIAGNOSIS — Z6838 Body mass index (BMI) 38.0-38.9, adult: Secondary | ICD-10-CM | POA: Diagnosis not present

## 2021-03-02 DIAGNOSIS — I10 Essential (primary) hypertension: Secondary | ICD-10-CM | POA: Diagnosis not present

## 2021-03-02 DIAGNOSIS — E039 Hypothyroidism, unspecified: Secondary | ICD-10-CM | POA: Diagnosis not present

## 2021-03-02 DIAGNOSIS — G4733 Obstructive sleep apnea (adult) (pediatric): Secondary | ICD-10-CM | POA: Diagnosis not present

## 2021-03-02 DIAGNOSIS — Z299 Encounter for prophylactic measures, unspecified: Secondary | ICD-10-CM | POA: Diagnosis not present

## 2021-03-02 DIAGNOSIS — E1165 Type 2 diabetes mellitus with hyperglycemia: Secondary | ICD-10-CM | POA: Diagnosis not present

## 2021-03-02 DIAGNOSIS — J309 Allergic rhinitis, unspecified: Secondary | ICD-10-CM | POA: Diagnosis not present

## 2021-03-04 DIAGNOSIS — J309 Allergic rhinitis, unspecified: Secondary | ICD-10-CM | POA: Diagnosis not present

## 2021-03-07 DIAGNOSIS — J309 Allergic rhinitis, unspecified: Secondary | ICD-10-CM | POA: Diagnosis not present

## 2021-03-09 DIAGNOSIS — J309 Allergic rhinitis, unspecified: Secondary | ICD-10-CM | POA: Diagnosis not present

## 2021-03-10 ENCOUNTER — Other Ambulatory Visit (HOSPITAL_COMMUNITY)
Admission: RE | Admit: 2021-03-10 | Discharge: 2021-03-10 | Disposition: A | Discharge status: Home / Self Care | Payer: Medicare Other | Source: Ambulatory Visit | Attending: Nephrology | Admitting: Nephrology

## 2021-03-10 DIAGNOSIS — N189 Chronic kidney disease, unspecified: Secondary | ICD-10-CM | POA: Diagnosis not present

## 2021-03-10 DIAGNOSIS — E1129 Type 2 diabetes mellitus with other diabetic kidney complication: Secondary | ICD-10-CM | POA: Diagnosis not present

## 2021-03-10 DIAGNOSIS — R600 Localized edema: Secondary | ICD-10-CM | POA: Insufficient documentation

## 2021-03-10 DIAGNOSIS — I5033 Acute on chronic diastolic (congestive) heart failure: Secondary | ICD-10-CM | POA: Diagnosis not present

## 2021-03-10 DIAGNOSIS — R809 Proteinuria, unspecified: Secondary | ICD-10-CM | POA: Insufficient documentation

## 2021-03-10 DIAGNOSIS — I129 Hypertensive chronic kidney disease with stage 1 through stage 4 chronic kidney disease, or unspecified chronic kidney disease: Secondary | ICD-10-CM | POA: Insufficient documentation

## 2021-03-10 DIAGNOSIS — E1122 Type 2 diabetes mellitus with diabetic chronic kidney disease: Secondary | ICD-10-CM | POA: Insufficient documentation

## 2021-03-10 LAB — RENAL FUNCTION PANEL
Albumin: 4 g/dL (ref 3.5–5.0)
Anion gap: 8 (ref 5–15)
BUN: 45 mg/dL — ABNORMAL HIGH (ref 8–23)
CO2: 29 mmol/L (ref 22–32)
Calcium: 9.2 mg/dL (ref 8.9–10.3)
Chloride: 99 mmol/L (ref 98–111)
Creatinine, Ser: 1.43 mg/dL — ABNORMAL HIGH (ref 0.44–1.00)
GFR, Estimated: 41 mL/min — ABNORMAL LOW (ref >60)
Glucose, Bld: 160 mg/dL — ABNORMAL HIGH (ref 70–99)
Phosphorus: 4.1 mg/dL (ref 2.5–4.6)
Potassium: 4.7 mmol/L (ref 3.5–5.1)
Sodium: 136 mmol/L (ref 135–145)

## 2021-03-11 DIAGNOSIS — J309 Allergic rhinitis, unspecified: Secondary | ICD-10-CM | POA: Diagnosis not present

## 2021-03-14 DIAGNOSIS — J309 Allergic rhinitis, unspecified: Secondary | ICD-10-CM | POA: Diagnosis not present

## 2021-03-15 DIAGNOSIS — Z794 Long term (current) use of insulin: Secondary | ICD-10-CM | POA: Diagnosis not present

## 2021-03-15 DIAGNOSIS — I2510 Atherosclerotic heart disease of native coronary artery without angina pectoris: Secondary | ICD-10-CM | POA: Diagnosis not present

## 2021-03-15 DIAGNOSIS — E11319 Type 2 diabetes mellitus with unspecified diabetic retinopathy without macular edema: Secondary | ICD-10-CM | POA: Diagnosis not present

## 2021-03-15 DIAGNOSIS — E118 Type 2 diabetes mellitus with unspecified complications: Secondary | ICD-10-CM | POA: Diagnosis not present

## 2021-03-15 DIAGNOSIS — E1140 Type 2 diabetes mellitus with diabetic neuropathy, unspecified: Secondary | ICD-10-CM | POA: Diagnosis not present

## 2021-03-15 DIAGNOSIS — Z9641 Presence of insulin pump (external) (internal): Secondary | ICD-10-CM | POA: Diagnosis not present

## 2021-03-15 DIAGNOSIS — M1460 Charcot's joint, unspecified site: Secondary | ICD-10-CM | POA: Diagnosis not present

## 2021-03-15 DIAGNOSIS — E785 Hyperlipidemia, unspecified: Secondary | ICD-10-CM | POA: Diagnosis not present

## 2021-03-15 DIAGNOSIS — E109 Type 1 diabetes mellitus without complications: Secondary | ICD-10-CM | POA: Diagnosis not present

## 2021-03-15 DIAGNOSIS — I10 Essential (primary) hypertension: Secondary | ICD-10-CM | POA: Diagnosis not present

## 2021-03-15 DIAGNOSIS — E039 Hypothyroidism, unspecified: Secondary | ICD-10-CM | POA: Diagnosis not present

## 2021-03-15 DIAGNOSIS — E669 Obesity, unspecified: Secondary | ICD-10-CM | POA: Diagnosis not present

## 2021-03-16 ENCOUNTER — Ambulatory Visit: Payer: Medicare Other | Admitting: Cardiology

## 2021-03-16 DIAGNOSIS — J309 Allergic rhinitis, unspecified: Secondary | ICD-10-CM | POA: Diagnosis not present

## 2021-03-16 DIAGNOSIS — N189 Chronic kidney disease, unspecified: Secondary | ICD-10-CM | POA: Diagnosis not present

## 2021-03-16 DIAGNOSIS — I5032 Chronic diastolic (congestive) heart failure: Secondary | ICD-10-CM | POA: Diagnosis not present

## 2021-03-16 DIAGNOSIS — E1122 Type 2 diabetes mellitus with diabetic chronic kidney disease: Secondary | ICD-10-CM | POA: Diagnosis not present

## 2021-03-16 DIAGNOSIS — I129 Hypertensive chronic kidney disease with stage 1 through stage 4 chronic kidney disease, or unspecified chronic kidney disease: Secondary | ICD-10-CM | POA: Diagnosis not present

## 2021-03-16 DIAGNOSIS — R809 Proteinuria, unspecified: Secondary | ICD-10-CM | POA: Diagnosis not present

## 2021-03-16 DIAGNOSIS — E1129 Type 2 diabetes mellitus with other diabetic kidney complication: Secondary | ICD-10-CM | POA: Diagnosis not present

## 2021-03-16 DIAGNOSIS — E6609 Other obesity due to excess calories: Secondary | ICD-10-CM | POA: Diagnosis not present

## 2021-03-16 DIAGNOSIS — R600 Localized edema: Secondary | ICD-10-CM | POA: Diagnosis not present

## 2021-03-18 DIAGNOSIS — J309 Allergic rhinitis, unspecified: Secondary | ICD-10-CM | POA: Diagnosis not present

## 2021-03-18 DIAGNOSIS — E669 Obesity, unspecified: Secondary | ICD-10-CM | POA: Diagnosis not present

## 2021-03-21 DIAGNOSIS — J309 Allergic rhinitis, unspecified: Secondary | ICD-10-CM | POA: Diagnosis not present

## 2021-03-23 DIAGNOSIS — J309 Allergic rhinitis, unspecified: Secondary | ICD-10-CM | POA: Diagnosis not present

## 2021-03-25 DIAGNOSIS — J309 Allergic rhinitis, unspecified: Secondary | ICD-10-CM | POA: Diagnosis not present

## 2021-03-28 DIAGNOSIS — J309 Allergic rhinitis, unspecified: Secondary | ICD-10-CM | POA: Diagnosis not present

## 2021-03-29 DIAGNOSIS — I10 Essential (primary) hypertension: Secondary | ICD-10-CM | POA: Diagnosis not present

## 2021-03-29 DIAGNOSIS — G4733 Obstructive sleep apnea (adult) (pediatric): Secondary | ICD-10-CM | POA: Diagnosis not present

## 2021-03-29 DIAGNOSIS — T7840XA Allergy, unspecified, initial encounter: Secondary | ICD-10-CM | POA: Diagnosis not present

## 2021-03-29 DIAGNOSIS — Z299 Encounter for prophylactic measures, unspecified: Secondary | ICD-10-CM | POA: Diagnosis not present

## 2021-03-30 DIAGNOSIS — J309 Allergic rhinitis, unspecified: Secondary | ICD-10-CM | POA: Diagnosis not present

## 2021-04-01 DIAGNOSIS — J309 Allergic rhinitis, unspecified: Secondary | ICD-10-CM | POA: Diagnosis not present

## 2021-04-04 DIAGNOSIS — J309 Allergic rhinitis, unspecified: Secondary | ICD-10-CM | POA: Diagnosis not present

## 2021-04-06 DIAGNOSIS — J309 Allergic rhinitis, unspecified: Secondary | ICD-10-CM | POA: Diagnosis not present

## 2021-04-07 ENCOUNTER — Other Ambulatory Visit (HOSPITAL_COMMUNITY)
Admission: RE | Admit: 2021-04-07 | Discharge: 2021-04-07 | Disposition: A | Discharge status: Home / Self Care | Payer: Medicare Other | Source: Ambulatory Visit | Attending: Nephrology | Admitting: Nephrology

## 2021-04-07 DIAGNOSIS — R600 Localized edema: Secondary | ICD-10-CM | POA: Diagnosis not present

## 2021-04-07 DIAGNOSIS — R809 Proteinuria, unspecified: Secondary | ICD-10-CM | POA: Insufficient documentation

## 2021-04-07 DIAGNOSIS — E1122 Type 2 diabetes mellitus with diabetic chronic kidney disease: Secondary | ICD-10-CM | POA: Diagnosis not present

## 2021-04-07 DIAGNOSIS — I129 Hypertensive chronic kidney disease with stage 1 through stage 4 chronic kidney disease, or unspecified chronic kidney disease: Secondary | ICD-10-CM | POA: Diagnosis not present

## 2021-04-07 DIAGNOSIS — N189 Chronic kidney disease, unspecified: Secondary | ICD-10-CM | POA: Insufficient documentation

## 2021-04-07 DIAGNOSIS — E1129 Type 2 diabetes mellitus with other diabetic kidney complication: Secondary | ICD-10-CM | POA: Diagnosis not present

## 2021-04-07 LAB — CBC
HCT: 40.1 % (ref 36.0–46.0)
Hemoglobin: 12.5 g/dL (ref 12.0–15.0)
MCH: 29.6 pg (ref 26.0–34.0)
MCHC: 31.2 g/dL (ref 30.0–36.0)
MCV: 94.8 fL (ref 80.0–100.0)
Platelets: 240 10*3/uL (ref 150–400)
RBC: 4.23 MIL/uL (ref 3.87–5.11)
RDW: 13.8 % (ref 11.5–15.5)
WBC: 4.9 10*3/uL (ref 4.0–10.5)
nRBC: 0 % (ref 0.0–0.2)

## 2021-04-07 LAB — RENAL FUNCTION PANEL
Albumin: 4.1 g/dL (ref 3.5–5.0)
Anion gap: 11 (ref 5–15)
BUN: 49 mg/dL — ABNORMAL HIGH (ref 8–23)
CO2: 27 mmol/L (ref 22–32)
Calcium: 9.4 mg/dL (ref 8.9–10.3)
Chloride: 101 mmol/L (ref 98–111)
Creatinine, Ser: 1.54 mg/dL — ABNORMAL HIGH (ref 0.44–1.00)
GFR, Estimated: 38 mL/min — ABNORMAL LOW (ref >60)
Glucose, Bld: 130 mg/dL — ABNORMAL HIGH (ref 70–99)
Phosphorus: 4.1 mg/dL (ref 2.5–4.6)
Potassium: 4.6 mmol/L (ref 3.5–5.1)
Sodium: 139 mmol/L (ref 135–145)

## 2021-04-07 LAB — PROTEIN / CREATININE RATIO, URINE
Creatinine, Urine: 29.9 mg/dL
Protein Creatinine Ratio: 0.27 mg/mg{Cre} — ABNORMAL HIGH (ref 0.00–0.15)
Total Protein, Urine: 8 mg/dL

## 2021-04-15 DIAGNOSIS — N189 Chronic kidney disease, unspecified: Secondary | ICD-10-CM | POA: Diagnosis not present

## 2021-04-15 DIAGNOSIS — I5032 Chronic diastolic (congestive) heart failure: Secondary | ICD-10-CM | POA: Diagnosis not present

## 2021-04-15 DIAGNOSIS — E1122 Type 2 diabetes mellitus with diabetic chronic kidney disease: Secondary | ICD-10-CM | POA: Diagnosis not present

## 2021-04-15 DIAGNOSIS — R809 Proteinuria, unspecified: Secondary | ICD-10-CM | POA: Diagnosis not present

## 2021-04-15 DIAGNOSIS — I129 Hypertensive chronic kidney disease with stage 1 through stage 4 chronic kidney disease, or unspecified chronic kidney disease: Secondary | ICD-10-CM | POA: Diagnosis not present

## 2021-04-15 DIAGNOSIS — E1129 Type 2 diabetes mellitus with other diabetic kidney complication: Secondary | ICD-10-CM | POA: Diagnosis not present

## 2021-05-04 DIAGNOSIS — E1151 Type 2 diabetes mellitus with diabetic peripheral angiopathy without gangrene: Secondary | ICD-10-CM | POA: Diagnosis not present

## 2021-05-04 DIAGNOSIS — E1140 Type 2 diabetes mellitus with diabetic neuropathy, unspecified: Secondary | ICD-10-CM | POA: Diagnosis not present

## 2021-05-13 ENCOUNTER — Ambulatory Visit: Payer: Medicare Other | Admitting: Allergy & Immunology

## 2021-05-24 NOTE — Progress Notes (Signed)
Oxygen Order faxed over to [facility]

## 2021-06-08 ENCOUNTER — Other Ambulatory Visit (HOSPITAL_COMMUNITY)
Admission: RE | Admit: 2021-06-08 | Discharge: 2021-06-08 | Disposition: A | Discharge status: Home / Self Care | Payer: Medicare Other | Source: Ambulatory Visit | Attending: Nephrology | Admitting: Nephrology

## 2021-06-08 DIAGNOSIS — E1129 Type 2 diabetes mellitus with other diabetic kidney complication: Secondary | ICD-10-CM | POA: Insufficient documentation

## 2021-06-08 DIAGNOSIS — I129 Hypertensive chronic kidney disease with stage 1 through stage 4 chronic kidney disease, or unspecified chronic kidney disease: Secondary | ICD-10-CM | POA: Insufficient documentation

## 2021-06-08 DIAGNOSIS — I5032 Chronic diastolic (congestive) heart failure: Secondary | ICD-10-CM | POA: Diagnosis not present

## 2021-06-08 DIAGNOSIS — N189 Chronic kidney disease, unspecified: Secondary | ICD-10-CM | POA: Diagnosis not present

## 2021-06-08 DIAGNOSIS — E1122 Type 2 diabetes mellitus with diabetic chronic kidney disease: Secondary | ICD-10-CM | POA: Diagnosis not present

## 2021-06-08 DIAGNOSIS — R809 Proteinuria, unspecified: Secondary | ICD-10-CM | POA: Insufficient documentation

## 2021-06-08 LAB — CBC
HCT: 36.8 % (ref 36.0–46.0)
Hemoglobin: 12.1 g/dL (ref 12.0–15.0)
MCH: 29.6 pg (ref 26.0–34.0)
MCHC: 32.9 g/dL (ref 30.0–36.0)
MCV: 90 fL (ref 80.0–100.0)
Platelets: 219 10*3/uL (ref 150–400)
RBC: 4.09 MIL/uL (ref 3.87–5.11)
RDW: 13.5 % (ref 11.5–15.5)
WBC: 4.5 10*3/uL (ref 4.0–10.5)
nRBC: 0 % (ref 0.0–0.2)

## 2021-06-08 LAB — RENAL FUNCTION PANEL
Albumin: 3.9 g/dL (ref 3.5–5.0)
Anion gap: 10 (ref 5–15)
BUN: 58 mg/dL — ABNORMAL HIGH (ref 8–23)
CO2: 29 mmol/L (ref 22–32)
Calcium: 9.4 mg/dL (ref 8.9–10.3)
Chloride: 102 mmol/L (ref 98–111)
Creatinine, Ser: 1.6 mg/dL — ABNORMAL HIGH (ref 0.44–1.00)
GFR, Estimated: 36 mL/min — ABNORMAL LOW (ref >60)
Glucose, Bld: 93 mg/dL (ref 70–99)
Phosphorus: 3.6 mg/dL (ref 2.5–4.6)
Potassium: 4.7 mmol/L (ref 3.5–5.1)
Sodium: 141 mmol/L (ref 135–145)

## 2021-06-08 LAB — PROTEIN / CREATININE RATIO, URINE
Creatinine, Urine: 30.04 mg/dL
Total Protein, Urine: <6 mg/dL

## 2021-06-15 DIAGNOSIS — E669 Obesity, unspecified: Secondary | ICD-10-CM | POA: Diagnosis not present

## 2021-06-15 DIAGNOSIS — Z9641 Presence of insulin pump (external) (internal): Secondary | ICD-10-CM | POA: Diagnosis not present

## 2021-06-15 DIAGNOSIS — E11319 Type 2 diabetes mellitus with unspecified diabetic retinopathy without macular edema: Secondary | ICD-10-CM | POA: Diagnosis not present

## 2021-06-15 DIAGNOSIS — E039 Hypothyroidism, unspecified: Secondary | ICD-10-CM | POA: Diagnosis not present

## 2021-06-15 DIAGNOSIS — E109 Type 1 diabetes mellitus without complications: Secondary | ICD-10-CM | POA: Diagnosis not present

## 2021-06-15 DIAGNOSIS — E118 Type 2 diabetes mellitus with unspecified complications: Secondary | ICD-10-CM | POA: Diagnosis not present

## 2021-06-15 DIAGNOSIS — M1460 Charcot's joint, unspecified site: Secondary | ICD-10-CM | POA: Diagnosis not present

## 2021-06-15 DIAGNOSIS — I2510 Atherosclerotic heart disease of native coronary artery without angina pectoris: Secondary | ICD-10-CM | POA: Diagnosis not present

## 2021-06-15 DIAGNOSIS — E785 Hyperlipidemia, unspecified: Secondary | ICD-10-CM | POA: Diagnosis not present

## 2021-06-15 DIAGNOSIS — E1140 Type 2 diabetes mellitus with diabetic neuropathy, unspecified: Secondary | ICD-10-CM | POA: Diagnosis not present

## 2021-06-15 DIAGNOSIS — I10 Essential (primary) hypertension: Secondary | ICD-10-CM | POA: Diagnosis not present

## 2021-06-15 DIAGNOSIS — Z794 Long term (current) use of insulin: Secondary | ICD-10-CM | POA: Diagnosis not present

## 2021-06-17 ENCOUNTER — Other Ambulatory Visit (HOSPITAL_COMMUNITY): Payer: Self-pay | Admitting: Nephrology

## 2021-06-17 DIAGNOSIS — R809 Proteinuria, unspecified: Secondary | ICD-10-CM | POA: Diagnosis not present

## 2021-06-17 DIAGNOSIS — N189 Chronic kidney disease, unspecified: Secondary | ICD-10-CM | POA: Diagnosis not present

## 2021-06-17 DIAGNOSIS — E1122 Type 2 diabetes mellitus with diabetic chronic kidney disease: Secondary | ICD-10-CM

## 2021-06-17 DIAGNOSIS — Z6838 Body mass index (BMI) 38.0-38.9, adult: Secondary | ICD-10-CM | POA: Diagnosis not present

## 2021-06-17 DIAGNOSIS — I5032 Chronic diastolic (congestive) heart failure: Secondary | ICD-10-CM

## 2021-06-17 DIAGNOSIS — E1129 Type 2 diabetes mellitus with other diabetic kidney complication: Secondary | ICD-10-CM | POA: Diagnosis not present

## 2021-06-17 DIAGNOSIS — I129 Hypertensive chronic kidney disease with stage 1 through stage 4 chronic kidney disease, or unspecified chronic kidney disease: Secondary | ICD-10-CM | POA: Diagnosis not present

## 2021-06-20 ENCOUNTER — Ambulatory Visit: Payer: Medicare Other | Admitting: Cardiology

## 2021-06-20 DIAGNOSIS — I447 Left bundle-branch block, unspecified: Secondary | ICD-10-CM | POA: Diagnosis not present

## 2021-06-25 DIAGNOSIS — J019 Acute sinusitis, unspecified: Secondary | ICD-10-CM | POA: Diagnosis not present

## 2021-06-25 DIAGNOSIS — B9789 Other viral agents as the cause of diseases classified elsewhere: Secondary | ICD-10-CM | POA: Diagnosis not present

## 2021-06-27 ENCOUNTER — Other Ambulatory Visit (HOSPITAL_COMMUNITY): Payer: Self-pay | Admitting: Physician Assistant

## 2021-06-27 NOTE — H&P (Signed)
Chief Complaint: Patient was seen in consultation today for a random renal biopsy.  Referring Physician(s): Bhutani,Manpreet S  Supervising Physician: Mir, Biochemist, clinical  Patient Status: [facility] - Out-pt  History of Present Illness: Jodi Ray is a 60 y.o. female with a past medical history significant for OSA, anemia, CAD, HTN, DM and CKD who presents today for a random renal biopsy. Ms. Morath has been followed by [facility] (Dr. Theador Hawthorne) for several years due to CKD thought to be secondary to DM, she was most recently noted to have a creatinine of 1.6 and patient expressed concerns about cause of CKD, she has requested further evaluation and IR has been consulted for a random renal biopsy to further direct care.  Ms. Crotwell denies any complaints currently, she has some nerve pain in her leg which is chronic but has otherwise been feeling well. She understands the procedure today and is agreeable to proceed as planned.   Past Medical History:  Diagnosis Date   Adult RDS ([place])    Anemia    Brain aneurysm    frontal lobe   CAD (coronary artery disease)    a. 02/2018: s/p DES to Proximal LAD with residual 20% RCA stenosis.   Chronic pain    Diabetes mellitus without complication (HCC)    Headache    History of left bundle branch block (LBBB)    HTN (hypertension)    Hx of cardiovascular stress test 10/2016   intermediate risk study   Hypothyroidism    Neuromuscular disorder (HCC)    Neuropathy    Obesity    OSA on CPAP 03/10/2015   Retinopathy    Sleep apnea    Varicose veins of both lower extremities     Past Surgical History:  Procedure Laterality Date   BIOPSY  11/14/2016   Procedure: BIOPSY;  Surgeon: Danie Binder, MD;  [facility];  Service: Endoscopy;;  colon gastric duodenal   BREAST SURGERY Left 1994   Lumpectomy   COLONOSCOPY WITH PROPOFOL N/A 11/14/2016   Dr. Oneida Alar: Moderately redundant rectosigmoid colon. Random colon  biopsies benign. Internal hemorrhoids. Surveillance colonoscopy in 5 years.   CORONARY STENT INTERVENTION N/A 03/22/2018   Procedure: CORONARY STENT INTERVENTION;  Surgeon: Burnell Blanks, MD;  Location: [facility];  Service: Cardiovascular;  Laterality: N/A;   ESOPHAGOGASTRODUODENOSCOPY (EGD) WITH PROPOFOL N/A 11/14/2016   Dr. Oneida Alar: Esophagus normal. Moderate gastritis, few gastric polyps. Duodenal biopsies negative. Fundic gland polyp gastric polyp area no H pylori.   FOOT SURGERY     LEFT HEART CATH AND CORONARY ANGIOGRAPHY N/A 03/22/2018   Procedure: LEFT HEART CATH AND CORONARY ANGIOGRAPHY;  Surgeon: Burnell Blanks, MD;  Location: [facility];  Service: Cardiovascular;  Laterality: N/A;   LEG SURGERY Right    REPLACEMENT TOTAL KNEE Right    SPINAL CORD STIMULATOR IMPLANT      Allergies: Fentanyl  Medications: Prior to Admission medications   Medication Sig Start Date End Date Taking? Authorizing Provider  atorvastatin (LIPITOR) 80 MG tablet TAKE ONE TABLET BY MOUTH DAILY AT 6 PM 06/01/20  Yes Strader, [place] M, PA-C  carvedilol (COREG) 3.125 MG tablet TAKE ONE TABLET BY MOUTH 1 HOUR BEFORE CARDIAC REHAB Patient taking differently: Take 3.125 mg by mouth 2 (two) times daily. 10/18/20  Yes Verta Ellen., NP  EPINEPHrine 0.3 mg/0.3 mL IJ SOAJ injection Inject 0.3 mg into the muscle as needed for anaphylaxis. 04/24/18  Yes [provider]  furosemide (LASIX) 20 MG tablet Take 20-40 mg by mouth See admin instructions. Take 40 mg in the morning and 20 mg in the evening 11/17/20 11/17/21 Yes [provider]  levothyroxine (SYNTHROID) 112 MCG tablet Take 112 mcg by mouth daily before breakfast.   Yes [provider]  lisinopril (ZESTRIL) 10 MG tablet Take 10 mg by mouth daily. 02/09/19  Yes [provider]  loratadine (CLARITIN) 10 MG tablet Take 10 mg by mouth daily.   Yes [provider]  metFORMIN (GLUCOPHAGE)  500 MG tablet Take 1 tablet (500 mg total) by mouth 2 (two) times daily with a meal. 11/19/19  Yes Barton Dubois, MD  nitroGLYCERIN (NITROSTAT) 0.4 MG SL tablet Place 1 tablet (0.4 mg total) under the tongue every 5 (five) minutes as needed for chest pain. 04/17/18  Yes Strader, [place] M, PA-C  NOVOLOG 100 UNIT/ML injection Inject into the skin continuous. In insulin pump 02/24/18  Yes [provider]  pantoprazole (PROTONIX) 40 MG tablet Take 40 mg by mouth daily.   Yes [provider]  Galcanezumab-gnlm (EMGALITY) 120 MG/ML SOAJ Inject 120 mg into the skin every 28 (twenty-eight) days. Patient not taking: Reported on 06/21/2021 01/17/21   Pieter Partridge, DO  Insulin Human (INSULIN PUMP) SOLN Inject into the skin continuous.     [provider]  OXYGEN Inhale into the lungs. As needed    [provider]     Family History  Problem Relation Age of Onset   Cancer Mother 52       colon   Cancer Father        renal cell   Cancer Brother        bladder   Cancer Sister        primary brain, and primary breast too   Cancer Sister        breast    Cancer Other        multiple aunts with breast, cervical, or colon cancer    Social History   Socioeconomic History   Marital status: Widowed    Spouse name: Not on file   Number of children: 0   Years of education: Not on file   Highest education level: Bachelor's degree (e.g., BA, AB, BS)  Occupational History   Occupation: Retired  Tobacco Use   Smoking status: Never   Smokeless tobacco: Never  Vaping Use   Vaping Use: Never used  Substance and Sexual Activity   Alcohol use: No    Alcohol/week: 0.0 standard drinks   Drug use: No   Sexual activity: Yes    Partners: Male  Other Topics Concern   Not on file  Social History Narrative   Drinks about 1 cup coffee day.   Cardiac rehab 3x week   One level home with significant other   Bachelors in Accounting      Right handed   No children    widow   Social Determinants of Health   Financial Resource Strain: Not on file  Food Insecurity: Not on file  Transportation Needs: Not on file  Physical Activity: Not on file  Stress: Not on file  Social Connections: Not on file     Review of Systems: A 12 point ROS discussed and pertinent positives are indicated in the HPI above.  All other systems are negative.  Review of Systems  Constitutional:  Negative for chills and fever.  Respiratory:  Negative for cough and shortness of breath.  Cardiovascular:  Negative for chest pain.  Gastrointestinal:  Negative for abdominal pain, nausea and vomiting.  Genitourinary:  Negative for flank pain and hematuria.  Musculoskeletal:  Negative for back pain.  Neurological:  Negative for dizziness and headaches.   Vital Signs: BP 136/60   Pulse 76   Temp 97.8 F (36.6 C) (Oral)   Resp 16   Ht 5\' 8"  (1.727 m)   Wt 246 lb (111.6 kg)   SpO2 95%   BMI 37.40 kg/m   Physical Exam Vitals reviewed.  HENT:     Head: Normocephalic.     Mouth/Throat:     Mouth: Mucous membranes are moist.     Pharynx: Oropharynx is clear. No oropharyngeal exudate or posterior oropharyngeal erythema.  Cardiovascular:     Rate and Rhythm: Normal rate and regular rhythm.  Pulmonary:     Effort: Pulmonary effort is normal.     Breath sounds: Normal breath sounds.  Abdominal:     General: There is no distension.     Palpations: Abdomen is soft.     Tenderness: There is no abdominal tenderness.  Skin:    General: Skin is warm and dry.  Neurological:     Mental Status: She is alert and oriented to person, place, and time.  Psychiatric:        Mood and Affect: Mood normal.        Behavior: Behavior normal.        Thought Content: Thought content normal.        Judgment: Judgment normal.     MD Evaluation Airway: WNL Heart: WNL Abdomen: WNL Chest/ Lungs: WNL ASA  Classification: 3 Mallampati/Airway Score: Two   Imaging: No results  found.  Labs:  CBC: Recent Labs    11/29/20 0822 12/29/20 0853 04/07/21 0831 06/08/21 0828  WBC 5.2 5.1 4.9 4.5  HGB 12.0 11.3* 12.5 12.1  HCT 37.5 36.0 40.1 36.8  PLT 222 235 240 219    COAGS: No results for input(s): INR, APTT in the last 8760 hours.  BMP: Recent Labs    02/09/21 0821 03/10/21 0838 04/07/21 0831 06/08/21 0828  NA 136 136 139 141  K 4.6 4.7 4.6 4.7  CL 101 99 101 102  CO2 26 29 27 29   GLUCOSE 147* 160* 130* 93  BUN 37* 45* 49* 58*  CALCIUM 8.9 9.2 9.4 9.4  CREATININE 1.26* 1.43* 1.54* 1.60*  GFRNONAA 48* 41* 38* 36*    LIVER FUNCTION TESTS: Recent Labs    02/09/21 0821 03/10/21 0838 04/07/21 0831 06/08/21 0828  ALBUMIN 3.9 4.0 4.1 3.9    TUMOR MARKERS: No results for input(s): AFPTM, CEA, CA199, CHROMGRNA in the last 8760 hours.  Assessment and Plan:  60 y/o F with history of DM, HTN, CKD who presents today for a random renal biopsy to further evaluate cause of CKD.   Risks and benefits of random renal biopsy was discussed with the patient and/or patient's family including, but not limited to bleeding, infection, damage to adjacent structures or low yield requiring additional tests.  All of the questions were answered and there is agreement to proceed.  Consent signed and in chart.  Thank you for this interesting consult.  I greatly enjoyed meeting LOYCE FLAMING and look forward to participating in their care.  A copy of this report was sent to the requesting provider on this date.  Electronically Signed: Joaquim Nam, PA-C 06/27/2021, 2:48 PM   I spent a total of 30  Minutes in face to face in clinical consultation, greater than 50% of which was counseling/coordinating care for random renal biopsy.

## 2021-06-28 ENCOUNTER — Encounter (HOSPITAL_COMMUNITY): Payer: Self-pay

## 2021-06-28 ENCOUNTER — Other Ambulatory Visit (HOSPITAL_COMMUNITY): Payer: Self-pay | Admitting: Nephrology

## 2021-06-28 ENCOUNTER — Ambulatory Visit (HOSPITAL_COMMUNITY)
Admission: RE | Admit: 2021-06-28 | Discharge: 2021-06-28 | Disposition: A | Discharge status: Home / Self Care | Payer: Medicare Other | Source: Ambulatory Visit | Attending: Nephrology | Admitting: Nephrology

## 2021-06-28 DIAGNOSIS — D631 Anemia in chronic kidney disease: Secondary | ICD-10-CM | POA: Diagnosis not present

## 2021-06-28 DIAGNOSIS — I129 Hypertensive chronic kidney disease with stage 1 through stage 4 chronic kidney disease, or unspecified chronic kidney disease: Secondary | ICD-10-CM | POA: Insufficient documentation

## 2021-06-28 DIAGNOSIS — E1129 Type 2 diabetes mellitus with other diabetic kidney complication: Secondary | ICD-10-CM

## 2021-06-28 DIAGNOSIS — M79606 Pain in leg, unspecified: Secondary | ICD-10-CM | POA: Diagnosis not present

## 2021-06-28 DIAGNOSIS — N159 Renal tubulo-interstitial disease, unspecified: Secondary | ICD-10-CM | POA: Insufficient documentation

## 2021-06-28 DIAGNOSIS — E1122 Type 2 diabetes mellitus with diabetic chronic kidney disease: Secondary | ICD-10-CM | POA: Insufficient documentation

## 2021-06-28 DIAGNOSIS — N189 Chronic kidney disease, unspecified: Secondary | ICD-10-CM | POA: Diagnosis not present

## 2021-06-28 DIAGNOSIS — N19 Unspecified kidney failure: Secondary | ICD-10-CM | POA: Diagnosis not present

## 2021-06-28 DIAGNOSIS — G4733 Obstructive sleep apnea (adult) (pediatric): Secondary | ICD-10-CM | POA: Insufficient documentation

## 2021-06-28 DIAGNOSIS — I5032 Chronic diastolic (congestive) heart failure: Secondary | ICD-10-CM

## 2021-06-28 DIAGNOSIS — R809 Proteinuria, unspecified: Secondary | ICD-10-CM

## 2021-06-28 DIAGNOSIS — I2510 Atherosclerotic heart disease of native coronary artery without angina pectoris: Secondary | ICD-10-CM | POA: Diagnosis not present

## 2021-06-28 LAB — CBC
HCT: 37.3 % (ref 36.0–46.0)
Hemoglobin: 12.4 g/dL (ref 12.0–15.0)
MCH: 29.4 pg (ref 26.0–34.0)
MCHC: 33.2 g/dL (ref 30.0–36.0)
MCV: 88.4 fL (ref 80.0–100.0)
Platelets: 203 10*3/uL (ref 150–400)
RBC: 4.22 MIL/uL (ref 3.87–5.11)
RDW: 13.6 % (ref 11.5–15.5)
WBC: 5.5 10*3/uL (ref 4.0–10.5)
nRBC: 0 % (ref 0.0–0.2)

## 2021-06-28 LAB — PROTIME-INR
INR: 0.9 (ref 0.8–1.2)
Prothrombin Time: 12.3 seconds (ref 11.4–15.2)

## 2021-06-28 LAB — GLUCOSE, CAPILLARY: Glucose-Capillary: 93 mg/dL (ref 70–99)

## 2021-06-28 MED ORDER — MIDAZOLAM HCL 2 MG/2ML IJ SOLN
INTRAMUSCULAR | Status: AC | PRN
  Administered 2021-06-28 (×4): .5 mg via INTRAVENOUS

## 2021-06-28 MED ORDER — HYDROMORPHONE HCL 1 MG/ML IJ SOLN
INTRAMUSCULAR | Status: AC | PRN
  Administered 2021-06-28 (×2): .5 mg via INTRAVENOUS

## 2021-06-28 MED ORDER — SODIUM CHLORIDE 0.9 % IV SOLN: INTRAVENOUS | Status: DC

## 2021-06-28 MED ORDER — SODIUM CHLORIDE 0.9 % IV SOLN
INTRAVENOUS | Status: AC | PRN
  Administered 2021-06-28: 10 mL/h via INTRAVENOUS

## 2021-06-28 MED ORDER — HYDROCODONE-ACETAMINOPHEN 5-325 MG PO TABS
1.0000 | ORAL_TABLET | ORAL | Status: DC | PRN
  Filled 2021-06-28: qty 2

## 2021-06-28 NOTE — Sedation Documentation (Signed)
Unable to perform biopsy in ultrasound. Pt moved to CT

## 2021-06-28 NOTE — Procedures (Signed)
Interventional Radiology Procedure Note  Procedure: CT guided random renal biopsy  Indication: CKD  Findings: Please refer to procedural dictation for full description.  Complications: None  EBL: < 10 mL  Miachel Roux, MD [phone]

## 2021-07-04 ENCOUNTER — Encounter: Payer: Self-pay | Admitting: Cardiology

## 2021-07-04 ENCOUNTER — Other Ambulatory Visit: Payer: Self-pay

## 2021-07-04 ENCOUNTER — Ambulatory Visit (INDEPENDENT_AMBULATORY_CARE_PROVIDER_SITE_OTHER): Payer: Medicare Other | Admitting: Cardiology

## 2021-07-04 VITALS — BP 140/62 | HR 78 | Ht 68.0 in | Wt 248.8 lb

## 2021-07-04 DIAGNOSIS — E782 Mixed hyperlipidemia: Secondary | ICD-10-CM | POA: Diagnosis not present

## 2021-07-04 DIAGNOSIS — I2510 Atherosclerotic heart disease of native coronary artery without angina pectoris: Secondary | ICD-10-CM

## 2021-07-04 DIAGNOSIS — I10 Essential (primary) hypertension: Secondary | ICD-10-CM | POA: Diagnosis not present

## 2021-07-04 MED ORDER — ASPIRIN EC 81 MG PO TBEC: 81.0000 mg | DELAYED_RELEASE_TABLET | Freq: Every day | ORAL | 3 refills | Status: AC

## 2021-07-04 NOTE — Progress Notes (Signed)
Clinical Summary Jodi Ray is a 60 y.o.female seen today for follow up of the following medical problems.    1. CAD - drug-eluting stent placement to the proximal LAD on 03/22/2018. - from notes has chronic nonspecific chest pains  - LV systolic function is normal with an EF of 60 to 65% by echocardiogram in December 2019   - does cycling 3 days a week x 1 hr 15 min high impact up to 11 miles, weight training 4 days a week.  - occasonal chest pressure at very high levels of exertion.  - compliant with meds. Had some issues with low bp's previously, from 03/13/2019 note coreg was changed to just prior to exercise      - she stopped cardiac rehab - goes to [facility] 6 days a week. Does cycling classes, does 100 miles per week - she is off aspirin, she is not sure when and why stopped in the past. Denies any issues with it   2. HTN - compliant with meds Had some issues with low bp's previously, from 03/13/2019 note coreg was changed to just prior to exercise   - home bp usually 130s/70s - can have some white coat HTN   3. Hyperlipidemia - upcoming labs with pcp - Jan 2022 TC 167 TG 211 LDL 86   4. OSA - using cpap machine, followed by Dr Brett Fairy.    5. DM1 - followed by endocrinology   6. CKD 3 - followed by Dr Theador Hawthorne - had renal biopsy 06/28/2021. - from renal notes historically sensitive to diuretics - lasix managed by nephrology, takes 2 tables in AM and 1 tablet in PM. Swelling is much improved.     7. Chronic LBBB  8. Chronic diastolic HF  Echo LVEF 38-10%, grade I dd - diuretics per renal, from there note has historically been sensitive to diuretic dosing regarding her renal function      Past Medical History:  Diagnosis Date   Adult RDS ([place])    Anemia    Brain aneurysm    frontal lobe   CAD (coronary artery disease)    a. 02/2018: s/p DES to Proximal LAD with residual 20% RCA stenosis.   Chronic pain    Diabetes mellitus without complication (HCC)     Headache    History of left bundle Jodi Ray (LBBB)    HTN (hypertension)    Hx of cardiovascular stress test 10/2016   intermediate risk study   Hypothyroidism    Neuromuscular disorder (HCC)    Neuropathy    Obesity    OSA on CPAP 03/10/2015   Retinopathy    Sleep apnea    Varicose veins of both lower extremities      Allergies  Allergen Reactions   Fentanyl Nausea And Vomiting and Other (See Comments)    Nausea, vomiting, loss of consciousness, requiring reversal     Current Outpatient Medications  Medication Sig Dispense Refill   atorvastatin (LIPITOR) 80 MG tablet TAKE ONE TABLET BY MOUTH DAILY AT 6 PM 90 tablet 3   carvedilol (COREG) 3.125 MG tablet TAKE ONE TABLET BY MOUTH 1 HOUR BEFORE CARDIAC REHAB (Patient taking differently: Take 3.125 mg by mouth 2 (two) times daily.) 90 tablet 2   EPINEPHrine 0.3 mg/0.3 mL IJ SOAJ injection Inject 0.3 mg into the muscle as needed for anaphylaxis.  1   furosemide (LASIX) 20 MG tablet Take 20-40 mg by mouth See admin instructions. Take 40 mg in the morning  and 20 mg in the evening     Galcanezumab-gnlm (EMGALITY) 120 MG/ML SOAJ Inject 120 mg into the skin every 28 (twenty-eight) days. (Patient not taking: Reported on 06/21/2021) 1.12 mL 5   Insulin Human (INSULIN PUMP) SOLN Inject into the skin continuous.      levothyroxine (SYNTHROID) 112 MCG tablet Take 112 mcg by mouth daily before breakfast.     lisinopril (ZESTRIL) 10 MG tablet Take 10 mg by mouth daily.     loratadine (CLARITIN) 10 MG tablet Take 10 mg by mouth daily.     metFORMIN (GLUCOPHAGE) 500 MG tablet Take 1 tablet (500 mg total) by mouth 2 (two) times daily with a meal.     nitroGLYCERIN (NITROSTAT) 0.4 MG SL tablet Place 1 tablet (0.4 mg total) under the tongue every 5 (five) minutes as needed for chest pain. 25 tablet 3   NOVOLOG 100 UNIT/ML injection Inject into the skin continuous. In insulin pump  0   OXYGEN Inhale into the lungs. As needed     pantoprazole  (PROTONIX) 40 MG tablet Take 40 mg by mouth daily.     No current facility-administered medications for this visit.     Past Surgical History:  Procedure Laterality Date   BIOPSY  11/14/2016   Procedure: BIOPSY;  Surgeon: Jodi Binder, MD;  [facility];  Service: Endoscopy;;  colon gastric duodenal   BREAST SURGERY Left 1994   Lumpectomy   COLONOSCOPY WITH PROPOFOL N/A 11/14/2016   Dr. Oneida Ray: Moderately redundant rectosigmoid colon. Random colon biopsies benign. Internal hemorrhoids. Surveillance colonoscopy in 5 years.   CORONARY STENT INTERVENTION N/A 03/22/2018   Procedure: CORONARY STENT INTERVENTION;  Surgeon: Jodi Blanks, MD;  Location: [place] [facility];  Service: Cardiovascular;  Laterality: N/A;   ESOPHAGOGASTRODUODENOSCOPY (EGD) WITH PROPOFOL N/A 11/14/2016   Dr. Oneida Ray: Esophagus normal. Moderate gastritis, few gastric polyps. Duodenal biopsies negative. Fundic gland polyp gastric polyp area no H pylori.   FOOT SURGERY     LEFT HEART CATH AND CORONARY ANGIOGRAPHY N/A 03/22/2018   Procedure: LEFT HEART CATH AND CORONARY ANGIOGRAPHY;  Surgeon: Jodi Blanks, MD;  Location: [place] [facility];  Service: Cardiovascular;  Laterality: N/A;   LEG SURGERY Right    REPLACEMENT TOTAL KNEE Right    SPINAL CORD STIMULATOR IMPLANT       Allergies  Allergen Reactions   Fentanyl Nausea And Vomiting and Other (See Comments)    Nausea, vomiting, loss of consciousness, requiring reversal      Family History  Problem Relation Age of Onset   Cancer Mother 7       colon   Cancer Father        renal cell   Cancer Brother        bladder   Cancer Sister        primary brain, and primary breast too   Cancer Sister        breast    Cancer Other        multiple aunts with breast, cervical, or colon cancer     Social History Jodi Ray reports that she has never smoked. She has never used smokeless tobacco. Jodi Ray reports no history of  alcohol use.   Review of Systems CONSTITUTIONAL: No weight loss, fever, chills, weakness or fatigue.  HEENT: Eyes: No visual loss, blurred vision, double vision or yellow sclerae.No hearing loss, sneezing, congestion, runny nose or sore throat.  SKIN: No rash or itching.  CARDIOVASCULAR:  per hpi RESPIRATORY: No shortness of breath, cough or sputum.  GASTROINTESTINAL: No anorexia, nausea, vomiting or diarrhea. No abdominal pain or blood.  GENITOURINARY: No burning on urination, no polyuria NEUROLOGICAL: No headache, dizziness, syncope, paralysis, ataxia, numbness or tingling in the extremities. No change in bowel or bladder control.  MUSCULOSKELETAL: No muscle, back pain, joint pain or stiffness.  LYMPHATICS: No enlarged nodes. No history of splenectomy.  PSYCHIATRIC: No history of depression or anxiety.  ENDOCRINOLOGIC: No reports of sweating, cold or heat intolerance. No polyuria or polydipsia.  Marland Kitchen   Physical Examination Today's Vitals   07/04/21 1042  BP: 140/62  Pulse: 78  SpO2: 97%  Weight: 248 lb 12.8 oz (112.9 kg)  Height: 5\' 8"  (1.727 m)   Body mass index is 37.83 kg/m.  Gen: resting comfortably, no acute distress HEENT: no scleral icterus, pupils equal round and reactive, no palptable cervical adenopathy,  CV: RRR, no m/r/g no jvd Resp: Clear to auscultation bilaterally GI: abdomen is soft, non-tender, non-distended, normal bowel sounds, no hepatosplenomegaly MSK: extremities are warm, no edema.  Skin: warm, no rash Neuro:  no focal deficits Psych: appropriate affect   Diagnostic Studies Echocardiogram on 07/17/2018 demonstrated normal left ventricular systolic function and regional wall motion, LVEF 60 to 65%, indeterminate grade diastolic dysfunction, moderate LVH, and mild left atrial dilatation.     Cardiac catheterization 03/22/18:   Prox RCA lesion is 20% stenosed. Prox LAD lesion is 80% stenosed. A drug-eluting stent was successfully placed using a STENT  SIERRA 3.00 X 18 MM. Post intervention, there is a 0% residual stenosis. The left ventricular systolic function is normal. LV end diastolic pressure is normal. The left ventricular ejection fraction is 55-65% by visual estimate. There is no mitral valve regurgitation.   1. Severe stenosis proximal LAD 2. Successful PTCA/DES x 1 proximal LAD 3. Mild non-obstructive disease in the RCA\ 4. Normal LV systolic function   10/7827 echo 1. Left ventricular ejection fraction, by estimation, is 60 to 65%. The  left ventricle has normal function. The left ventricle has no regional  wall motion abnormalities. Left ventricular diastolic parameters are  consistent with Grade I diastolic  dysfunction (impaired relaxation).   2. Right ventricular systolic function is normal. The right ventricular  size is normal. Tricuspid regurgitation signal is inadequate for assessing  PA pressure.   3. The mitral valve is degenerative. No evidence of mitral valve  regurgitation. No evidence of mitral stenosis.   4. The aortic valve is tricuspid. Aortic valve regurgitation is not  visualized. No aortic stenosis is present.   5. The inferior vena cava is dilated in size with >50% respiratory  variability, suggesting right atrial pressure of 8 mmHg.   10/2020 echo IMPRESSIONS     1. Left ventricular ejection fraction, by estimation, is 60 to 65%. The  left ventricle has normal function. The left ventricle has no regional  wall motion abnormalities. Left ventricular diastolic parameters are  consistent with Grade I diastolic  dysfunction (impaired relaxation).   2. Right ventricular systolic function is normal. The right ventricular  size is normal.   3. Left atrial size was severely dilated.   4. Right atrial size was mildly dilated.   5. The mitral valve is normal in structure. No evidence of mitral valve  regurgitation. No evidence of mitral stenosis.   6. The aortic valve is tricuspid. There is mild  calcification of the  aortic valve. There is mild thickening of the aortic valve. Aortic valve  regurgitation is not visualized. No aortic stenosis is present.   7. The inferior vena cava is normal in size with greater than 50%  respiratory variability, suggesting right atrial pressure of 3 mmHg.     Assessment and Plan  1.  Coronary artery disease - no symptoms, continues to tolerate very high levels of exercise without symptoms - continue current meds, restart ASA 81mg  daily   2.  Hypertension -component of white coat HTN, home numbers at goal, COntinue current meds   3.  Hyperlipidemia - contniue lipitor, we will request labs from pcp      Arnoldo Lenis, M.D.

## 2021-07-04 NOTE — Patient Instructions (Addendum)
Medication Instructions:  Your physician has recommended you make the following change in your medication:  START Aspirin 81 mg tablets daily  *If you need a refill on your cardiac medications before your next appointment, please call your pharmacy*   Lab Work: We have requested your most recent lab work from your primary care provider If you have labs (blood work) drawn today and your tests are completely normal, you will receive your results only by: [place] (if you have MyChart) OR A paper copy in the mail If you have any lab test that is abnormal or we need to change your treatment, we will call you to review the results.   Testing/Procedures: None   Follow-Up: At [facility], you and your health needs are our priority.  As part of our continuing mission to provide you with exceptional heart care, we have created designated Provider Care Teams.  These Care Teams include your primary Cardiologist (physician) and Advanced Practice Providers (APPs -  Physician Assistants and Nurse Practitioners) who all work together to provide you with the care you need, when you need it.  We recommend signing up for the patient portal called "MyChart".  Sign up information is provided on this After Visit Summary.  MyChart is used to connect with patients for Virtual Visits (Telemedicine).  Patients are able to view lab/test results, encounter notes, upcoming appointments, etc.  Non-urgent messages can be sent to your provider as well.   To learn more about what you can do with MyChart, go to [email or web address].    Your next appointment:   6 month(s)  The format for your next appointment:   In Person  Provider:   Carlyle Dolly, MD    Other Instructions    DASH Eating Plan DASH stands for Dietary Approaches to Stop Hypertension. The DASH eating plan is a healthy eating plan that has been shown to: Reduce high blood pressure (hypertension). Reduce your risk for type 2  diabetes, heart disease, and stroke. Help with weight loss. What are tips for following this plan? Reading food labels Check food labels for the amount of salt (sodium) per serving. Choose foods with less than 5 percent of the Daily Value of sodium. Generally, foods with less than 300 milligrams (mg) of sodium per serving fit into this eating plan. To find whole grains, look for the word "whole" as the first word in the ingredient list. Shopping Buy products labeled as "low-sodium" or "no salt added." Buy fresh foods. Avoid canned foods and pre-made or frozen meals. Cooking Avoid adding salt when cooking. Use salt-free seasonings or herbs instead of table salt or sea salt. Check with your health care provider or pharmacist before using salt substitutes. Do not fry foods. Cook foods using healthy methods such as baking, boiling, grilling, roasting, and broiling instead. Cook with heart-healthy oils, such as olive, canola, avocado, soybean, or sunflower oil. Meal planning  Eat a balanced diet that includes: 4 or more servings of fruits and 4 or more servings of vegetables each day. Try to fill one-half of your plate with fruits and vegetables. 6-8 servings of whole grains each day. Less than 6 oz (170 g) of lean meat, poultry, or fish each day. A 3-oz (85-g) serving of meat is about the same size as a deck of cards. One egg equals 1 oz (28 g). 2-3 servings of low-fat dairy each day. One serving is 1 cup (237 mL). 1 serving of nuts, seeds, or beans 5 times each week.  2-3 servings of heart-healthy fats. Healthy fats called omega-3 fatty acids are found in foods such as walnuts, flaxseeds, fortified milks, and eggs. These fats are also found in cold-water fish, such as sardines, salmon, and mackerel. Limit how much you eat of: Canned or prepackaged foods. Food that is high in trans fat, such as some fried foods. Food that is high in saturated fat, such as fatty meat. Desserts and other sweets,  sugary drinks, and other foods with added sugar. Full-fat dairy products. Do not salt foods before eating. Do not eat more than 4 egg yolks a week. Try to eat at least 2 vegetarian meals a week. Eat more home-cooked food and less restaurant, buffet, and fast food. Lifestyle When eating at a restaurant, ask that your food be prepared with less salt or no salt, if possible. If you drink alcohol: Limit how much you use to: 0-1 drink a day for women who are not pregnant. 0-2 drinks a day for men. Be aware of how much alcohol is in your drink. In the [place] one drink equals one 12 oz bottle of beer (355 mL), one 5 oz glass of wine (148 mL), or one 1 oz glass of hard liquor (44 mL). General information Avoid eating more than 2,300 mg of salt a day. If you have hypertension, you may need to reduce your sodium intake to 1,500 mg a day. Work with your health care provider to maintain a healthy body weight or to lose weight. Ask what an ideal weight is for you. Get at least 30 minutes of exercise that causes your heart to beat faster (aerobic exercise) most days of the week. Activities may include walking, swimming, or biking. Work with your health care provider or dietitian to adjust your eating plan to your individual calorie needs. What foods should I eat? Fruits All fresh, dried, or frozen fruit. Canned fruit in natural juice (without added sugar). Vegetables Fresh or frozen vegetables (raw, steamed, roasted, or grilled). Low-sodium or reduced-sodium tomato and vegetable juice. Low-sodium or reduced-sodium tomato sauce and tomato paste. Low-sodium or reduced-sodium canned vegetables. Grains Whole-grain or whole-wheat bread. Whole-grain or whole-wheat pasta. Brown rice. [place]. Bulgur. Whole-grain and low-sodium cereals. Pita bread. Low-fat, low-sodium crackers. Whole-wheat flour tortillas. Meats and other proteins Skinless chicken or [place]. Ground chicken or [place]. Pork with fat  trimmed off. Fish and seafood. Egg whites. Dried beans, peas, or lentils. Unsalted nuts, nut butters, and seeds. Unsalted canned beans. Lean cuts of beef with fat trimmed off. Low-sodium, lean precooked or cured meat, such as sausages or meat loaves. Dairy Low-fat (1%) or fat-free (skim) milk. Reduced-fat, low-fat, or fat-free cheeses. Nonfat, low-sodium ricotta or cottage cheese. Low-fat or nonfat yogurt. Low-fat, low-sodium cheese. Fats and oils Soft margarine without trans fats. Vegetable oil. Reduced-fat, low-fat, or light mayonnaise and salad dressings (reduced-sodium). Canola, safflower, olive, avocado, soybean, and sunflower oils. Avocado. Seasonings and condiments Herbs. Spices. Seasoning mixes without salt. Other foods Unsalted popcorn and pretzels. Fat-free sweets. The items listed above may not be a complete list of foods and beverages you can eat. Contact a dietitian for more information. What foods should I avoid? Fruits Canned fruit in a light or heavy syrup. Fried fruit. Fruit in cream or butter sauce. Vegetables Creamed or fried vegetables. Vegetables in a cheese sauce. Regular canned vegetables (not low-sodium or reduced-sodium). Regular canned tomato sauce and paste (not low-sodium or reduced-sodium). Regular tomato and vegetable juice (not low-sodium or reduced-sodium). Angie Fava. Olives. Grains Baked goods made  with fat, such as croissants, muffins, or some breads. Dry pasta or rice meal packs. Meats and other proteins Fatty cuts of meat. Ribs. Fried meat. Berniece Salines. Bologna, salami, and other precooked or cured meats, such as sausages or meat loaves. Fat from the back of a pig (fatback). Bratwurst. Salted nuts and seeds. Canned beans with added salt. Canned or smoked fish. Whole eggs or egg yolks. Chicken or [place] with skin. Dairy Whole or 2% milk, cream, and half-and-half. Whole or full-fat cream cheese. Whole-fat or sweetened yogurt. Full-fat cheese. Nondairy creamers. Whipped  toppings. Processed cheese and cheese spreads. Fats and oils Butter. Stick margarine. Lard. Shortening. Ghee. Bacon fat. Tropical oils, such as coconut, palm kernel, or palm oil. Seasonings and condiments Onion salt, garlic salt, seasoned salt, table salt, and sea salt. Worcestershire sauce. Tartar sauce. Barbecue sauce. Teriyaki sauce. Soy sauce, including reduced-sodium. Steak sauce. Canned and packaged gravies. Fish sauce. Oyster sauce. Cocktail sauce. Store-bought horseradish. Ketchup. Mustard. Meat flavorings and tenderizers. Bouillon cubes. Hot sauces. Pre-made or packaged marinades. Pre-made or packaged taco seasonings. Relishes. Regular salad dressings. Other foods Salted popcorn and pretzels. The items listed above may not be a complete list of foods and beverages you should avoid. Contact a dietitian for more information. Where to find more information National Heart, Lung, and Blood Institute: https://wilson-eaton.com/ American Heart Association: www.heart.org Academy of Nutrition and Dietetics: www.eatright.[place]: www.kidney.org Summary The DASH eating plan is a healthy eating plan that has been shown to reduce high blood pressure (hypertension). It may also reduce your risk for type 2 diabetes, heart disease, and stroke. When on the DASH eating plan, aim to eat more fresh fruits and vegetables, whole grains, lean proteins, low-fat dairy, and heart-healthy fats. With the DASH eating plan, you should limit salt (sodium) intake to 2,300 mg a day. If you have hypertension, you may need to reduce your sodium intake to 1,500 mg a day. Work with your health care provider or dietitian to adjust your eating plan to your individual calorie needs. This information is not intended to replace advice given to you by your health care provider. Make sure you discuss any questions you have with your health care provider. Document Revised: 06/20/2019 Document Reviewed:  06/20/2019 Elsevier Patient Education  2022 [employer].

## 2021-07-05 DIAGNOSIS — N183 Chronic kidney disease, stage 3 unspecified: Secondary | ICD-10-CM | POA: Diagnosis not present

## 2021-07-05 DIAGNOSIS — Z6839 Body mass index (BMI) 39.0-39.9, adult: Secondary | ICD-10-CM | POA: Diagnosis not present

## 2021-07-05 DIAGNOSIS — I10 Essential (primary) hypertension: Secondary | ICD-10-CM | POA: Diagnosis not present

## 2021-07-05 DIAGNOSIS — E1122 Type 2 diabetes mellitus with diabetic chronic kidney disease: Secondary | ICD-10-CM | POA: Diagnosis not present

## 2021-07-05 DIAGNOSIS — Z299 Encounter for prophylactic measures, unspecified: Secondary | ICD-10-CM | POA: Diagnosis not present

## 2021-07-06 DIAGNOSIS — Z9641 Presence of insulin pump (external) (internal): Secondary | ICD-10-CM | POA: Diagnosis not present

## 2021-07-06 DIAGNOSIS — E109 Type 1 diabetes mellitus without complications: Secondary | ICD-10-CM | POA: Diagnosis not present

## 2021-07-06 DIAGNOSIS — E039 Hypothyroidism, unspecified: Secondary | ICD-10-CM | POA: Diagnosis not present

## 2021-07-11 ENCOUNTER — Encounter: Payer: Self-pay | Admitting: Cardiology

## 2021-07-11 ENCOUNTER — Encounter (HOSPITAL_COMMUNITY): Payer: Self-pay

## 2021-07-11 LAB — SURGICAL PATHOLOGY

## 2021-07-12 ENCOUNTER — Other Ambulatory Visit (HOSPITAL_COMMUNITY)
Admission: RE | Admit: 2021-07-12 | Discharge: 2021-07-12 | Disposition: A | Discharge status: Home / Self Care | Payer: Medicare Other | Source: Ambulatory Visit | Attending: Nephrology | Admitting: Nephrology

## 2021-07-12 DIAGNOSIS — I5032 Chronic diastolic (congestive) heart failure: Secondary | ICD-10-CM | POA: Diagnosis not present

## 2021-07-12 DIAGNOSIS — Z6838 Body mass index (BMI) 38.0-38.9, adult: Secondary | ICD-10-CM | POA: Diagnosis not present

## 2021-07-12 DIAGNOSIS — N189 Chronic kidney disease, unspecified: Secondary | ICD-10-CM | POA: Insufficient documentation

## 2021-07-12 DIAGNOSIS — R809 Proteinuria, unspecified: Secondary | ICD-10-CM | POA: Insufficient documentation

## 2021-07-12 DIAGNOSIS — E1151 Type 2 diabetes mellitus with diabetic peripheral angiopathy without gangrene: Secondary | ICD-10-CM | POA: Diagnosis not present

## 2021-07-12 DIAGNOSIS — I129 Hypertensive chronic kidney disease with stage 1 through stage 4 chronic kidney disease, or unspecified chronic kidney disease: Secondary | ICD-10-CM | POA: Insufficient documentation

## 2021-07-12 DIAGNOSIS — E1140 Type 2 diabetes mellitus with diabetic neuropathy, unspecified: Secondary | ICD-10-CM | POA: Diagnosis not present

## 2021-07-12 DIAGNOSIS — E1129 Type 2 diabetes mellitus with other diabetic kidney complication: Secondary | ICD-10-CM | POA: Diagnosis not present

## 2021-07-12 DIAGNOSIS — E1122 Type 2 diabetes mellitus with diabetic chronic kidney disease: Secondary | ICD-10-CM | POA: Insufficient documentation

## 2021-07-12 LAB — PROTEIN / CREATININE RATIO, URINE
Creatinine, Urine: 54.58 mg/dL
Total Protein, Urine: <6 mg/dL

## 2021-07-12 LAB — RENAL FUNCTION PANEL
Albumin: 4.2 g/dL (ref 3.5–5.0)
Anion gap: 12 (ref 5–15)
BUN: 62 mg/dL — ABNORMAL HIGH (ref 8–23)
CO2: 25 mmol/L (ref 22–32)
Calcium: 9.7 mg/dL (ref 8.9–10.3)
Chloride: 98 mmol/L (ref 98–111)
Creatinine, Ser: 1.74 mg/dL — ABNORMAL HIGH (ref 0.44–1.00)
GFR, Estimated: 33 mL/min — ABNORMAL LOW (ref >60)
Glucose, Bld: 128 mg/dL — ABNORMAL HIGH (ref 70–99)
Phosphorus: 3.5 mg/dL (ref 2.5–4.6)
Potassium: 4.9 mmol/L (ref 3.5–5.1)
Sodium: 135 mmol/L (ref 135–145)

## 2021-07-12 LAB — CBC
HCT: 35.8 % — ABNORMAL LOW (ref 36.0–46.0)
Hemoglobin: 12.2 g/dL (ref 12.0–15.0)
MCH: 30.4 pg (ref 26.0–34.0)
MCHC: 34.1 g/dL (ref 30.0–36.0)
MCV: 89.3 fL (ref 80.0–100.0)
Platelets: 241 10*3/uL (ref 150–400)
RBC: 4.01 MIL/uL (ref 3.87–5.11)
RDW: 13.9 % (ref 11.5–15.5)
WBC: 4.8 10*3/uL (ref 4.0–10.5)
nRBC: 0 % (ref 0.0–0.2)

## 2021-07-12 LAB — FERRITIN: Ferritin: 43 ng/mL (ref 11–307)

## 2021-07-12 LAB — VITAMIN D 25 HYDROXY (VIT D DEFICIENCY, FRACTURES): Vit D, 25-Hydroxy: 34.64 ng/mL (ref 30–100)

## 2021-07-12 LAB — IRON AND TIBC
Iron: 97 ug/dL (ref 28–170)
Saturation Ratios: 24 % (ref 10.4–31.8)
TIBC: 410 ug/dL (ref 250–450)
UIBC: 313 ug/dL

## 2021-07-12 NOTE — Telephone Encounter (Signed)
Some times there are genetic components to high blood pressure which lead to elevated blood pressures even despite health diet and exercise habits and require medication changes. Can she check her home bp's twice a day for 5 days and give [place] the numbers. We may need to consider medication adjustments  Zandra Abts MD

## 2021-07-13 ENCOUNTER — Encounter: Payer: Self-pay | Admitting: Allergy & Immunology

## 2021-07-13 ENCOUNTER — Ambulatory Visit (INDEPENDENT_AMBULATORY_CARE_PROVIDER_SITE_OTHER): Payer: Medicare Other | Admitting: Allergy & Immunology

## 2021-07-13 ENCOUNTER — Other Ambulatory Visit: Payer: Self-pay

## 2021-07-13 VITALS — BP 132/62 | HR 79 | Temp 97.6°F | Resp 14 | Ht 68.0 in | Wt 242.0 lb

## 2021-07-13 DIAGNOSIS — N179 Acute kidney failure, unspecified: Secondary | ICD-10-CM

## 2021-07-13 DIAGNOSIS — Z79899 Other long term (current) drug therapy: Secondary | ICD-10-CM

## 2021-07-13 DIAGNOSIS — N1831 Chronic kidney disease, stage 3a: Secondary | ICD-10-CM

## 2021-07-13 DIAGNOSIS — J302 Other seasonal allergic rhinitis: Secondary | ICD-10-CM | POA: Diagnosis not present

## 2021-07-13 DIAGNOSIS — I2510 Atherosclerotic heart disease of native coronary artery without angina pectoris: Secondary | ICD-10-CM

## 2021-07-13 DIAGNOSIS — J3089 Other allergic rhinitis: Secondary | ICD-10-CM | POA: Diagnosis not present

## 2021-07-13 LAB — PTH, INTACT AND CALCIUM
Calcium, Total (PTH): 9.9 mg/dL (ref 8.7–10.3)
PTH: 69 pg/mL — ABNORMAL HIGH (ref 15–65)

## 2021-07-13 MED ORDER — CETIRIZINE HCL 10 MG PO TABS: 10.0000 mg | ORAL_TABLET | Freq: Every day | ORAL | 5 refills | Status: DC

## 2021-07-13 NOTE — Progress Notes (Signed)
NEW PATIENT  Date of Service/Encounter:  07/13/21  Consult requested by: Monico Blitz, MD   Assessment:   Seasonal and perennial allergic rhinitis (grasses, ragweed, weeds, trees, indoor molds, outdoor molds, dust mites, cat, and dog) - interested in starting allergen immunotherapy  On beta blocker therapy  Complicated past medical history including renal failure as well as type 1 diabetes and migraines  GFR 33 (December 2022) - therefore no dose adjustment needed yet   Jodi Ray is a delightful female presenting for neurology evaluation.  She has been on allergen immunotherapy for approximately 3 years via 2 different providers.  She was allowed to use her allergy shots at home.  I told her we do not allow that in our practice to the liability.  She is medically complex and is on a beta-blocker, which makes her more high risk for adverse reactions to anaphylaxis.  She is agreeable to get the shots in the office.  Plan/Recommendations:   1. Chronic rhinitis - Testing today showed: grasses, ragweed, weeds, trees, indoor molds, outdoor molds, dust mites, cat, and dog. - Copy of test results provided.  - Avoidance measures provided. - Stop taking: loratadine - Start taking: Zyrtec (cetirizine) 10mg  tablet once daily - You can use an extra dose of the antihistamine, if needed, for breakthrough symptoms.  - Consider nasal saline rinses 1-2 times daily to remove allergens from the nasal cavities as well as help with mucous clearance (this is especially helpful to do before the nasal sprays are given) - We will start allergy shots as a means of long-term control. - Allergy shots "re-train" and "reset" the immune system to ignore environmental allergens and decrease the resulting immune response to those allergens (sneezing, itchy watery eyes, runny nose, nasal congestion, etc).    - Allergy shots improve symptoms in 75-85% of patients.  - Make an appointment to start shots in 2-3 weeks.  -  We will count your shots with Dr. Fredderick Phenix in your total 3-5 years of treatment.   2. Return in about 3 months (around 10/11/2021).    This note in its entirety was forwarded to the Provider who requested this consultation.  Subjective:   Jodi Ray is a 60 y.o. female presenting today for evaluation of  Chief Complaint  Patient presents with   Allergies    Patient used to see a Dr where she received allergy shots. States she needs to establish care with a new Allergy Doctor.    Jodi Ray has a history of the following: Patient Active Problem List   Diagnosis Date Noted   Seasonal and perennial allergic rhinitis 07/13/2021   Diabetic ketoacidosis without coma associated with type 1 diabetes mellitus ([place]) 11/15/2019   Class 1 obesity 11/15/2019   Acute renal failure superimposed on stage 3 chronic kidney disease ([place]) 11/15/2019   CAD (coronary artery disease) 04/16/2018   Unstable angina ([place]) 03/21/2018   Atypical chest pain 03/20/2018   Gastritis and gastroduodenitis 03/05/2017   Suprapubic pain 03/05/2017   Right sided abdominal pain 10/24/2016   Diarrhea 10/24/2016   Family hx of colon cancer 10/24/2016   Varicose veins of bilateral lower extremities with other complications 78/58/8502   Insomnia with sleep apnea 03/09/2016   Type I diabetes mellitus, uncontrolled 04/30/2015   Mixed hyperlipidemia 04/30/2015   Primary hypothyroidism 04/30/2015   Benign hypertension 04/30/2015   OSA on CPAP 03/10/2015   Migraine without aura and without status migrainosus, not intractable 01/08/2015   Primary stabbing headache  10/02/2014    History obtained from: chart review and patient.  Jodi Ray was referred by Monico Blitz, MD.     PCP: Dr. Manuella Ghazi Endocrinologist: Dr. Chalmers Cater Nephrologist: Dr. Theador Hawthorne  Neurologist: Dr. Tomi Likens Podiatrist: Dr. Caryl Comes  Sleep Medicine: Dr. Brett Fairy Cardiologist: Dr. Deshanti Adcox is a 60 y.o. female presenting for an  evaluation of environmental allergies . She was married to an [place] years ago.   She was seeing Dr. Harold Hedge. She was there for 18 months. She went there to get shots. Then Dr. Manuella Ghazi decided to do allergy shots and she gave them to herself at home. She did this for 18 months. But now Dr. Manuella Ghazi stopped doing it. This is why she is here, to continue with allergen immunotherapy.  Dr. Gennette Pac did testing and an unknown date.  She was positive to Bayberry, birch, marsh elder, rabbit, oak, grass mix, ragweed mix, as well as some molds.  Allergic Rhinitis Symptom History: She has a long history of allergic rhinitis. She was previously seen by Dr. Fredderick Phenix. She was driving down three times per week.  Since stopped her shots, her symptoms have been under poor control. She has tried loratadine. She has not tried cetirizine or fexofenadine. She has tried a number of nose sprays that make her nauseous. She is not interested in trying nose sprays. He has never tried montelukast. She is very careful about her medications because she has renal failure.   She has a history of type 1 diabetes. She also has some renal failure and is likely going to be on dialysis.   She broke her leg in 2007 and this is when her health declined. 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 She has been here for 8 years. She was over the cold weather when she lived in [place] and then [place].   Her current boyfriend was an Clinical biochemist. He has been retired since 2014.   She otherwise has no history of atopic disease. Allergies have been a problem since she lived in [place].   Otherwise, there is no history of other atopic diseases, including asthma, food allergies, drug allergies, stinging insect allergies, eczema, urticaria, or contact dermatitis. There is no significant infectious history. Vaccinations are up to date.    Past Medical History: Patient Active Problem List    Diagnosis Date Noted   Seasonal and perennial allergic rhinitis 07/13/2021   Diabetic ketoacidosis without coma associated with type 1 diabetes mellitus ([place]) 11/15/2019   Class 1 obesity 11/15/2019   Acute renal failure superimposed on stage 3 chronic kidney disease ([place]) 11/15/2019   CAD (coronary artery disease) 04/16/2018   Unstable angina ([place]) 03/21/2018   Atypical chest pain 03/20/2018   Gastritis and gastroduodenitis 03/05/2017   Suprapubic pain 03/05/2017   Right sided abdominal pain 10/24/2016   Diarrhea 10/24/2016   Family hx of colon cancer 10/24/2016   Varicose veins of bilateral lower extremities with other complications 24/82/5003   Insomnia with sleep apnea 03/09/2016   Type I diabetes mellitus, uncontrolled 04/30/2015   Mixed hyperlipidemia 04/30/2015   Primary hypothyroidism 04/30/2015   Benign hypertension 04/30/2015   OSA on CPAP 03/10/2015   Migraine without aura and without status migrainosus, not intractable 01/08/2015   Primary stabbing headache 10/02/2014    Medication List:  Allergies as of 07/13/2021       Reactions   Other Nausea And Vomiting, Other (See Comments)   Opiates Alters mental status  diarrhea   Fentanyl Nausea And Vomiting, Other (See Comments)   Nausea, vomiting, loss of consciousness, requiring reversal        Medication List        Accurate as of July 13, 2021  1:12 PM. If you have any questions, ask your nurse or doctor.          STOP taking these medications    Emgality 120 MG/ML Soaj Generic drug: Galcanezumab-gnlm Stopped by: Valentina Shaggy, MD       TAKE these medications    aspirin EC 81 MG tablet Take 1 tablet (81 mg total) by mouth daily. Swallow whole.   atorvastatin 80 MG tablet Commonly known as: LIPITOR TAKE ONE TABLET BY MOUTH DAILY AT 6 PM   carvedilol 3.125 MG tablet Commonly known as: COREG TAKE ONE TABLET BY MOUTH 1 HOUR BEFORE CARDIAC REHAB   Dexcom G6 Sensor Misc 1 Device by  Other route as directed.   EPINEPHrine 0.3 mg/0.3 mL Soaj injection Commonly known as: EPI-PEN Inject 0.3 mg into the muscle as needed for anaphylaxis.   furosemide 20 MG tablet Commonly known as: LASIX Take 20-40 mg by mouth See admin instructions. Take 40 mg in the morning and 20 mg in the evening   insulin pump Soln Inject into the skin continuous.   levothyroxine 112 MCG tablet Commonly known as: SYNTHROID Take 112 mcg by mouth daily before breakfast.   lisinopril 10 MG tablet Commonly known as: ZESTRIL Take 10 mg by mouth daily.   loratadine 10 MG tablet Commonly known as: CLARITIN Take 10 mg by mouth daily.   metFORMIN 500 MG tablet Commonly known as: GLUCOPHAGE Take 1 tablet (500 mg total) by mouth 2 (two) times daily with a meal.   nitroGLYCERIN 0.4 MG SL tablet Commonly known as: NITROSTAT Place 1 tablet (0.4 mg total) under the tongue every 5 (five) minutes as needed for chest pain.   NovoLOG 100 UNIT/ML injection Generic drug: insulin aspart Inject into the skin continuous. In insulin pump   OXYGEN Inhale into the lungs. As needed   pantoprazole 40 MG tablet Commonly known as: PROTONIX Take 40 mg by mouth daily.        Birth History: non-contributory  Developmental History: non-contributory  Past Surgical History: Past Surgical History:  Procedure Laterality Date   BIOPSY  11/14/2016   Procedure: BIOPSY;  Surgeon: Danie Binder, MD;  [facility];  Service: Endoscopy;;  colon gastric duodenal   BREAST SURGERY Left 1994   Lumpectomy   COLONOSCOPY WITH PROPOFOL N/A 11/14/2016   Dr. Oneida Alar: Moderately redundant rectosigmoid colon. Random colon biopsies benign. Internal hemorrhoids. Surveillance colonoscopy in 5 years.   CORONARY STENT INTERVENTION N/A 03/22/2018   Procedure: CORONARY STENT INTERVENTION;  Surgeon: Burnell Blanks, MD;  Location: [facility];  Service: Cardiovascular;  Laterality: N/A;    ESOPHAGOGASTRODUODENOSCOPY (EGD) WITH PROPOFOL N/A 11/14/2016   Dr. Oneida Alar: Esophagus normal. Moderate gastritis, few gastric polyps. Duodenal biopsies negative. Fundic gland polyp gastric polyp area no H pylori.   FOOT SURGERY     LEFT HEART CATH AND CORONARY ANGIOGRAPHY N/A 03/22/2018   Procedure: LEFT HEART CATH AND CORONARY ANGIOGRAPHY;  Surgeon: Burnell Blanks, MD;  Location: [facility];  Service: Cardiovascular;  Laterality: N/A;   LEG SURGERY Right    REPLACEMENT TOTAL KNEE Right    SPINAL CORD STIMULATOR IMPLANT       Family History: Family History  Problem Relation Age of  Onset   Cancer Mother 40       colon   Cancer Father        renal cell   Cancer Brother        bladder   Cancer Sister        primary brain, and primary breast too   Cancer Sister        breast    Cancer Other        multiple aunts with breast, cervical, or colon cancer     Social History: Maurica lives at home with her boyfriend.  She has in a house that is 4 years old.  There is hardwood throughout the home.  She has gas and electric heating and central cooling.  There is 1 dog inside of the home and wildlife outside of the home.  There are dust mite covers on her bedding and her pillows.  There is no tobacco exposure.  She is currently retired and spends her time going from one doctor visit to another.   Review of Systems  Constitutional: Negative.  Negative for chills, fever, malaise/fatigue and weight loss.  HENT:  Positive for congestion and sinus pain. Negative for ear discharge and ear pain.        Positive for postnasal drip.  Eyes:  Negative for pain, discharge and redness.  Respiratory:  Negative for cough, sputum production, shortness of breath and wheezing.   Cardiovascular: Negative.  Negative for chest pain and palpitations.  Gastrointestinal:  Negative for abdominal pain, constipation, diarrhea, heartburn, nausea and vomiting.  Skin: Negative.  Negative for itching and  rash.  Neurological:  Negative for dizziness and headaches.  Endo/Heme/Allergies:  Negative for environmental allergies. Does not bruise/bleed easily.      Objective:   Blood pressure 132/62, Ray 79, temperature 97.6 F (36.4 C), temperature source Temporal, resp. rate 14, height 5\' 8"  (1.727 m), weight 242 lb (109.8 kg), SpO2 97 %. Body mass index is 36.8 kg/m.   Physical Exam:   Physical Exam Vitals reviewed.  Constitutional:      Appearance: She is well-developed and overweight.     Comments: Very talkative.  HENT:     Head: Normocephalic and atraumatic.     Right Ear: Tympanic membrane, ear canal and external ear normal. No drainage, swelling or tenderness. Tympanic membrane is not injected, scarred, erythematous, retracted or bulging.     Left Ear: Tympanic membrane, ear canal and external ear normal. No drainage, swelling or tenderness. Tympanic membrane is not injected, scarred, erythematous, retracted or bulging.     Nose: Mucosal edema and rhinorrhea present. No nasal deformity or septal deviation.     Right Turbinates: Enlarged, swollen and pale.     Left Turbinates: Enlarged, swollen and pale.     Right Sinus: No maxillary sinus tenderness or frontal sinus tenderness.     Left Sinus: No maxillary sinus tenderness or frontal sinus tenderness.     Comments: Clear rhinorrhea bilaterally. No nasal polyps.     Mouth/Throat:     Lips: Pink.     Mouth: Mucous membranes are moist. Mucous membranes are not pale and not dry.     Pharynx: Uvula midline.     Comments: Cobblestoning in the posterior oropharynx.  Eyes:     General:        Right eye: No discharge.        Left eye: No discharge.     Conjunctiva/sclera: Conjunctivae normal.     Right eye: Right  conjunctiva is not injected. No chemosis.    Left eye: Left conjunctiva is not injected. No chemosis.    Pupils: Pupils are equal, round, and reactive to light.  Cardiovascular:     Rate and Rhythm: Normal rate and  regular rhythm.     Heart sounds: Normal heart sounds.  Pulmonary:     Effort: Pulmonary effort is normal. No tachypnea, accessory muscle usage or respiratory distress.     Breath sounds: Normal breath sounds. No wheezing, rhonchi or rales.     Comments: Moving air well in all lung fields. No increased work of breathing noted.  Chest:     Chest wall: No tenderness.  Abdominal:     Tenderness: There is no abdominal tenderness. There is no guarding or rebound.  Lymphadenopathy:     Head:     Right side of head: No submandibular, tonsillar or occipital adenopathy.     Left side of head: No submandibular, tonsillar or occipital adenopathy.     Cervical: No cervical adenopathy.  Skin:    Coloration: Skin is not pale.     Findings: No abrasion, erythema, petechiae or rash. Rash is not papular, urticarial or vesicular.  Neurological:     Mental Status: She is alert.  Psychiatric:        Behavior: Behavior is cooperative.     Diagnostic studies:   Allergy Studies:     Airborne Adult Perc - 07/13/21 0914     Time Antigen Placed 0914    Allergen Manufacturer Lavella Hammock    Location Back    Number of Test 59    1. Control-Buffer 50% Glycerol Negative    2. Control-Histamine 1 mg/ml 2+    3. Albumin saline Negative    4. [place] 2+    5. [place] Negative    6. [place] 2+    7. [place] Blue Negative    8. Meadow Fescue 2+    9. Perennial Rye 3+    10. Sweet Vernal 3+    11. Timothy 2+    12. Cocklebur Negative    13. Burweed Marshelder Negative    14. Ragweed, short 3+    15. Ragweed, Giant Negative    16. Plantain,  English Negative    17. Lamb's Quarters Negative    18. Sheep Sorrell Negative    19. Rough Pigweed Negative    20. Marsh Elder, Rough 2+    21. Mugwort, Common Negative    22. Ash mix Negative    23. Birch mix 2+    24. Beech American Negative    25. Box, Elder Negative    26. Cedar, red Negative    27. Cottonwood, [place] Negative    28. Elm mix Negative     29. Hickory Negative    30. Maple mix Negative    31. Oak, [place] mix Negative    32. Pecan Pollen Negative    33. Pine mix 3+    34. Sycamore Eastern Negative    35. [place], Black Pollen Negative    36. Alternaria alternata Negative    37. Cladosporium Herbarum Negative    38. Aspergillus mix Negative    39. Penicillium mix Negative    40. Bipolaris sorokiniana (Helminthosporium) Negative    41. Drechslera spicifera (Curvularia) Negative    42. Mucor plumbeus Negative    43. Fusarium moniliforme Negative    44. Aureobasidium pullulans (pullulara) Negative    45. Rhizopus oryzae Negative    46. Botrytis cinera Negative  47. Epicoccum nigrum Negative    48. Phoma betae Negative    49. Candida Albicans Negative    50. Trichophyton mentagrophytes Negative    51. Mite, D Farinae  5,000 AU/ml 3+    52. Mite, D Pteronyssinus  5,000 AU/ml 3+    53. Cat Hair 10,000 BAU/ml 3+    54.  Dog Epithelia 2+    55. Mixed Feathers Negative    56. Horse Epithelia Negative    57. Cockroach, German Negative    58. Mouse Negative    59. Tobacco Leaf Negative             Intradermal - 07/13/21 0953     Time Antigen Placed 2229    Allergen Manufacturer Lavella Hammock    Location Arm    Number of Test 7    Control Negative    [place] Negative    Mold 1 Negative    Mold 2 1+    Mold 3 1+    Mold 4 Negative    Cockroach Negative             Allergy testing results were read and interpreted by myself, documented by clinical staff.         Salvatore Marvel, MD [facility]

## 2021-07-13 NOTE — Patient Instructions (Addendum)
1. Chronic rhinitis - Testing today showed: grasses, ragweed, weeds, trees, indoor molds, outdoor molds, dust mites, cat, and dog. - Copy of test results provided.  - Avoidance measures provided. - Stop taking: loratadine - Start taking: Zyrtec (cetirizine) 10mg  tablet once daily - You can use an extra dose of the antihistamine, if needed, for breakthrough symptoms.  - Consider nasal saline rinses 1-2 times daily to remove allergens from the nasal cavities as well as help with mucous clearance (this is especially helpful to do before the nasal sprays are given) - We will start allergy shots as a means of long-term control. - Allergy shots "re-train" and "reset" the immune system to ignore environmental allergens and decrease the resulting immune response to those allergens (sneezing, itchy watery eyes, runny nose, nasal congestion, etc).    - Allergy shots improve symptoms in 75-85% of patients.  - Make an appointment to start shots in 2-3 weeks.  - We will count your shots with Dr. Fredderick Phenix in your total 3-5 years of treatment.   2. Return in about 3 months (around 10/11/2021).    Please inform us of any Emergency Department visits, hospitalizations, or changes in symptoms. Call us before going to the ED for breathing or allergy symptoms since we might be able to fit you in for a sick visit. Feel free to contact us anytime with any questions, problems, or concerns.  It was a pleasure to meet you today!  Websites that have reliable patient information: 1. American Academy of Asthma, Allergy, and Immunology: www.aaaai.org 2. Food Allergy Research and Education (FARE): foodallergy.org 3. Mothers of Asthmatics: http://www.asthmacommunitynetwork.org 4. American College of Allergy, Asthma, and Immunology: www.acaai.org   COVID-19 Vaccine Information can be found at: [email or web address] For questions related to vaccine distribution or  appointments, please email [email or web address] or call [phone].   We realize that you might be concerned about having an allergic reaction to the COVID19 vaccines. To help with that concern, WE ARE OFFERING THE COVID19 VACCINES IN OUR OFFICE! Ask the front desk for dates!     Like [place] on [employer] and Instagram for our latest updates!      A healthy democracy works best when [employer] participate! Make sure you are registered to vote! If you have moved or changed any of your contact information, you will need to get this updated before voting!  In some cases, you MAY be able to register to vote online: [email or web address]       Airborne Adult Perc - 07/13/21 0914     Time Antigen Placed 0914    Allergen Manufacturer Lavella Hammock    Location Back    Number of Test 59    1. Control-Buffer 50% Glycerol Negative    2. Control-Histamine 1 mg/ml 2+    3. Albumin saline Negative    4. [place] 2+    5. [place] Negative    6. Johnson 2+    7. Hermann Blue Negative    8. Meadow Fescue 2+    9. Perennial Rye 3+    10. Sweet Vernal 3+    11. Timothy 2+    12. Cocklebur Negative    13. Burweed Marshelder Negative    14. Ragweed, short 3+    15. Ragweed, Giant Negative    16. Plantain,  English Negative    17. Lamb's Quarters Negative    18. Sheep Sorrell Negative    19. Rough Pigweed Negative    20. Marsh Elder, Rough 2+    21.  Mugwort, Common Negative    22. Ash mix Negative    23. Birch mix 2+    24. Beech American Negative    25. Box, Elder Negative    26. Cedar, red Negative    27. Cottonwood, [place] Negative    28. Elm mix Negative    29. Hickory Negative    30. Maple mix Negative    31. Oak, [place] mix Negative    32. Pecan Pollen Negative    33. Pine mix 3+    34. Sycamore Eastern Negative    35. Owsley, Black Pollen Negative    36. Alternaria alternata Negative    37. Cladosporium Herbarum Negative    38. Aspergillus mix Negative     39. Penicillium mix Negative    40. Bipolaris sorokiniana (Helminthosporium) Negative    41. Drechslera spicifera (Curvularia) Negative    42. Mucor plumbeus Negative    43. Fusarium moniliforme Negative    44. Aureobasidium pullulans (pullulara) Negative    45. Rhizopus oryzae Negative    46. Botrytis cinera Negative    47. Epicoccum nigrum Negative    48. Phoma betae Negative    49. Candida Albicans Negative    50. Trichophyton mentagrophytes Negative    51. Mite, D Farinae  5,000 AU/ml 3+    52. Mite, D Pteronyssinus  5,000 AU/ml 3+    53. Cat Hair 10,000 BAU/ml 3+    54.  Dog Epithelia 2+    55. Mixed Feathers Negative    56. Horse Epithelia Negative    57. Cockroach, German Negative    58. Mouse Negative    59. Tobacco Leaf Negative             Intradermal - 07/13/21 0953     Time Antigen Placed 6606    Allergen Manufacturer Lavella Hammock    Location Arm    Number of Test 7    Control Negative    [place] Negative    Mold 1 Negative    Mold 2 1+    Mold 3 1+    Mold 4 Negative    Cockroach Negative             Reducing Pollen Exposure  The American Academy of Allergy, Asthma and Immunology suggests the following steps to reduce your exposure to pollen during allergy seasons.    Do not hang sheets or clothing out to dry; pollen may collect on these items. Do not mow lawns or spend time around freshly cut grass; mowing stirs up pollen. Keep windows closed at night.  Keep car windows closed while driving. Minimize morning activities outdoors, a time when pollen counts are usually at their highest. Stay indoors as much as possible when pollen counts or humidity is high and on windy days when pollen tends to remain in the air longer. Use air conditioning when possible.  Many air conditioners have filters that trap the pollen spores. Use a HEPA room air filter to remove pollen form the indoor air you breathe.  Control of Mold Allergen   Mold and fungi can grow  on a variety of surfaces provided certain temperature and moisture conditions exist.  Outdoor molds grow on plants, decaying vegetation and soil.  The major outdoor mold, Alternaria and Cladosporium, are found in very high numbers during hot and dry conditions.  Generally, a late Summer - Fall peak is seen for common outdoor fungal spores.  Rain will temporarily lower outdoor mold spore count, but counts rise rapidly  when the rainy period ends.  The most important indoor molds are Aspergillus and Penicillium.  Dark, humid and poorly ventilated basements are ideal sites for mold growth.  The next most common sites of mold growth are the bathroom and the kitchen.  Outdoor (Seasonal) Mold Control  Positive outdoor molds via skin testing: Bipolaris (Helminthsporium), Drechslera (Curvalaria), and Mucor  Use air conditioning and keep windows closed Avoid exposure to decaying vegetation. Avoid leaf raking. Avoid grain handling. Consider wearing a face mask if working in moldy areas.    Indoor (Perennial) Mold Control   Positive indoor molds via skin testing: Aspergillus and Penicillium  Maintain humidity below 50%. Clean washable surfaces with 5% bleach solution. Remove sources e.g. contaminated carpets.    Control of Dog or Cat Allergen  Avoidance is the best way to manage a dog or cat allergy. If you have a dog or cat and are allergic to dog or cats, consider removing the dog or cat from the home. If you have a dog or cat but dont want to find it a new home, or if your family wants a pet even though someone in the household is allergic, here are some strategies that may help keep symptoms at bay:  Keep the pet out of your bedroom and restrict it to only a few rooms. Be advised that keeping the dog or cat in only one room will not limit the allergens to that room. Dont pet, hug or kiss the dog or cat; if you do, wash your hands with soap and water. High-efficiency particulate air (HEPA)  cleaners run continuously in a bedroom or living room can reduce allergen levels over time. Regular use of a high-efficiency vacuum cleaner or a central vacuum can reduce allergen levels. Giving your dog or cat a bath at least once a week can reduce airborne allergen.  Control of Dust Mite Allergen    Dust mites play a major role in allergic asthma and rhinitis.  They occur in environments with high humidity wherever human skin is found.  Dust mites absorb humidity from the atmosphere (ie, they do not drink) and feed on organic matter (including shed human and animal skin).  Dust mites are a microscopic type of insect that you cannot see with the naked eye.  High levels of dust mites have been detected from mattresses, pillows, carpets, upholstered furniture, bed covers, clothes, soft toys and any woven material.  The principal allergen of the dust mite is found in its feces.  A gram of dust may contain 1,000 mites and 250,000 fecal particles.  Mite antigen is easily measured in the air during house cleaning activities.  Dust mites do not bite and do not cause harm to humans, other than by triggering allergies/asthma.    Ways to decrease your exposure to dust mites in your home:  Encase mattresses, box springs and pillows with a mite-impermeable barrier or cover   Wash sheets, blankets and drapes weekly in hot water (130 F) with detergent and dry them in a dryer on the hot setting.  Have the room cleaned frequently with a vacuum cleaner and a damp dust-mop.  For carpeting or rugs, vacuuming with a vacuum cleaner equipped with a high-efficiency particulate air (HEPA) filter.  The dust mite allergic individual should not be in a room which is being cleaned and should wait 1 hour after cleaning before going into the room. Do not sleep on upholstered furniture (eg, couches).   If possible removing carpeting, upholstered  furniture and drapery from the home is ideal.  Horizontal blinds should be eliminated  in the rooms where the person spends the most time (bedroom, study, television room).  Washable vinyl, roller-type shades are optimal. Remove all non-washable stuffed toys from the bedroom.  Wash stuffed toys weekly like sheets and blankets above.   Reduce indoor humidity to less than 50%.  Inexpensive humidity monitors can be purchased at most hardware stores.  Do not use a humidifier as can make the problem worse and are not recommended.  Allergy Shots   Allergies are the result of a chain reaction that starts in the immune system. Your immune system controls how your body defends itself. For instance, if you have an allergy to pollen, your immune system identifies pollen as an invader or allergen. Your immune system overreacts by producing antibodies called Immunoglobulin E (IgE). These antibodies travel to cells that release chemicals, causing an allergic reaction.  The concept behind allergy immunotherapy, whether it is received in the form of shots or tablets, is that the immune system can be desensitized to specific allergens that trigger allergy symptoms. Although it requires time and patience, the payback can be long-term relief.  How Do Allergy Shots Work?  Allergy shots work much like a vaccine. Your body responds to injected amounts of a particular allergen given in increasing doses, eventually developing a resistance and tolerance to it. Allergy shots can lead to decreased, minimal or no allergy symptoms.  There generally are two phases: build-up and maintenance. Build-up often ranges from three to six months and involves receiving injections with increasing amounts of the allergens. The shots are typically given once or twice a week, though more rapid build-up schedules are sometimes used.  The maintenance phase begins when the most effective dose is reached. This dose is different for each person, depending on how allergic you are and your response to the build-up injections. Once the  maintenance dose is reached, there are longer periods between injections, typically two to four weeks.  Occasionally doctors give cortisone-type shots that can temporarily reduce allergy symptoms. These types of shots are different and should not be confused with allergy immunotherapy shots.  Who Can Be Treated with Allergy Shots?  Allergy shots may be a good treatment approach for people with allergic rhinitis (hay fever), allergic asthma, conjunctivitis (eye allergy) or stinging insect allergy.   Before deciding to begin allergy shots, you should consider:   The length of allergy season and the severity of your symptoms  Whether medications and/or changes to your environment can control your symptoms  Your desire to avoid long-term medication use  Time: allergy immunotherapy requires a major time commitment  Cost: may vary depending on your insurance coverage  Allergy shots for children age 37 and older are effective and often well tolerated. They might prevent the onset of new allergen sensitivities or the progression to asthma.  Allergy shots are not started on patients who are pregnant but can be continued on patients who become pregnant while receiving them. In some patients with other medical conditions or who take certain common medications, allergy shots may be of risk. It is important to mention other medications you talk to your allergist.   When Will I Feel Better?  Some may experience decreased allergy symptoms during the build-up phase. For others, it may take as long as 12 months on the maintenance dose. If there is no improvement after a year of maintenance, your allergist will discuss other treatment options  with you.  If you arent responding to allergy shots, it may be because there is not enough dose of the allergen in your vaccine or there are missing allergens that were not identified during your allergy testing. Other reasons could be that there are high levels of the  allergen in your environment or major exposure to non-allergic triggers like tobacco smoke.  What Is the Length of Treatment?  Once the maintenance dose is reached, allergy shots are generally continued for three to five years. The decision to stop should be discussed with your allergist at that time. Some people may experience a permanent reduction of allergy symptoms. Others may relapse and a longer course of allergy shots can be considered.  What Are the Possible Reactions?  The two types of adverse reactions that can occur with allergy shots are local and systemic. Common local reactions include very mild redness and swelling at the injection site, which can happen immediately or several hours after. A systemic reaction, which is less common, affects the entire body or a particular body system. They are usually mild and typically respond quickly to medications. Signs include increased allergy symptoms such as sneezing, a stuffy nose or hives.  Rarely, a serious systemic reaction called anaphylaxis can develop. Symptoms include swelling in the throat, wheezing, a feeling of tightness in the chest, nausea or dizziness. Most serious systemic reactions develop within 30 minutes of allergy shots. This is why it is strongly recommended you wait in your doctors office for 30 minutes after your injections. Your allergist is trained to watch for reactions, and his or her staff is trained and equipped with the proper medications to identify and treat them.  Who Should Administer Allergy Shots?  The preferred location for receiving shots is your prescribing allergists office. Injections can sometimes be given at another facility where the physician and staff are trained to recognize and treat reactions, and have received instructions by your prescribing allergist.

## 2021-07-14 DIAGNOSIS — J3081 Allergic rhinitis due to animal (cat) (dog) hair and dander: Secondary | ICD-10-CM | POA: Diagnosis not present

## 2021-07-14 NOTE — Progress Notes (Signed)
Aeroallergen Immunotherapy   Ordering Provider: Dr. Salvatore Marvel   Patient Details  Name: Jodi Ray  MRN: [id number]  Date of Birth: 19-May-1961   Order 2 of 2   Vial Label: DM/Molds   0.2 ml (Volume)  1:10 Concentration -- Aspergillus mix  0.2 ml (Volume)  1:10 Concentration -- Penicillium mix  0.2 ml (Volume)  1:20 Concentration -- Bipolaris sorokiniana  0.2 ml (Volume)  1:20 Concentration -- Drechslera spicifera  0.2 ml (Volume)  1:10 Concentration -- Mucor plumbeus  0.5 ml (Volume)   AU Concentration -- Mite Mix (DF 5,000 & DP 5,000)    1.5  ml Extract Subtotal  3.5  ml Diluent  5.0  ml Maintenance Total   Schedule:  A  Silver Vial (1:1,000,000):  Blue Vial (1:100,000): Schedule A (10 doses)  Yellow Vial (1:10,000): Schedule A (10 doses)  Green Vial (1:1,000): Schedule A (10 doses)  Red Vial (1:100): Schedule A (10 doses)   Special Instructions: none

## 2021-07-14 NOTE — Progress Notes (Signed)
Aeroallergen Immunotherapy   Ordering Provider: Dr. Salvatore Marvel   Patient Details  Name: Jodi Ray  MRN: [id number]  Date of Birth: 1961/07/16   Order 1 of 2   Vial Label: G/W/T/C/D   0.3 ml (Volume)  BAU Concentration -- 7 Grass Mix* 100,000 ([address] [place], [place], [place], [place] Rye, RedTop, Sweet Vernal, Timothy)  0.2 ml (Volume)  1:20 Concentration -- [place]  0.3 ml (Volume)  1:20 Concentration -- Ragweed Mix  0.5 ml (Volume)  1:20 Concentration -- Weed Mix*  0.5 ml (Volume)  1:20 Concentration -- Eastern 10 Tree Mix (also Sweet Gum)  0.2 ml (Volume)  1:10 Concentration -- Birch mix*  0.2 ml (Volume)  1:10 Concentration -- Pine Mix  0.5 ml (Volume)  1:10 Concentration -- Cat Hair  0.5 ml (Volume)  1:10 Concentration -- Dog Epithelia    3.2  ml Extract Subtotal  1.8  ml Diluent  5.0  ml Maintenance Total   Schedule:  A  Silver Vial (1:1,000,000):  Blue Vial (1:100,000): Schedule A (10 doses)  Yellow Vial (1:10,000): Schedule A (10 doses)  Green Vial (1:1,000): Schedule A (10 doses)  Red Vial (1:100): Schedule A (10 doses)   Special Instructions: none

## 2021-07-14 NOTE — Progress Notes (Signed)
VIALS MADE. EXP 07-14-22

## 2021-07-15 DIAGNOSIS — E211 Secondary hyperparathyroidism, not elsewhere classified: Secondary | ICD-10-CM | POA: Diagnosis not present

## 2021-07-15 DIAGNOSIS — I129 Hypertensive chronic kidney disease with stage 1 through stage 4 chronic kidney disease, or unspecified chronic kidney disease: Secondary | ICD-10-CM | POA: Diagnosis not present

## 2021-07-15 DIAGNOSIS — R809 Proteinuria, unspecified: Secondary | ICD-10-CM | POA: Diagnosis not present

## 2021-07-15 DIAGNOSIS — J3089 Other allergic rhinitis: Secondary | ICD-10-CM | POA: Diagnosis not present

## 2021-07-15 DIAGNOSIS — E1129 Type 2 diabetes mellitus with other diabetic kidney complication: Secondary | ICD-10-CM | POA: Diagnosis not present

## 2021-07-15 DIAGNOSIS — N189 Chronic kidney disease, unspecified: Secondary | ICD-10-CM | POA: Diagnosis not present

## 2021-07-15 DIAGNOSIS — E1122 Type 2 diabetes mellitus with diabetic chronic kidney disease: Secondary | ICD-10-CM | POA: Diagnosis not present

## 2021-07-15 DIAGNOSIS — I5032 Chronic diastolic (congestive) heart failure: Secondary | ICD-10-CM | POA: Diagnosis not present

## 2021-07-15 DIAGNOSIS — E6609 Other obesity due to excess calories: Secondary | ICD-10-CM | POA: Diagnosis not present

## 2021-07-18 DIAGNOSIS — Z6838 Body mass index (BMI) 38.0-38.9, adult: Secondary | ICD-10-CM | POA: Diagnosis not present

## 2021-07-18 DIAGNOSIS — E1165 Type 2 diabetes mellitus with hyperglycemia: Secondary | ICD-10-CM | POA: Diagnosis not present

## 2021-07-18 DIAGNOSIS — Z713 Dietary counseling and surveillance: Secondary | ICD-10-CM | POA: Diagnosis not present

## 2021-07-18 DIAGNOSIS — Z299 Encounter for prophylactic measures, unspecified: Secondary | ICD-10-CM | POA: Diagnosis not present

## 2021-07-18 DIAGNOSIS — I10 Essential (primary) hypertension: Secondary | ICD-10-CM | POA: Diagnosis not present

## 2021-07-27 NOTE — Progress Notes (Signed)
NEUROLOGY FOLLOW UP OFFICE NOTE  Jodi Ray [id number]  Assessment/Plan:   Episodic cluster headache Hypertension OSA  Prevention:  Emgality Rescue:  100% O2 15L/min for 15-20 min Limit use of pain relievers to no more than 2 days out of week to prevent risk of rebound or medication-overuse headache. Keep headache diary Continue CPAP Follow up one year   Subjective:  Jodi Ray is a 60 year old handed woman with chronic pain related to complex regional pain syndrome (with spinal stimulator), hypertension, type 1 diabetes mellitus with polyneuropathy, OSA on CPAP, hypothyroidism, polymyalgia rheumatica who follows up for cluster headache.   UPDATE: Intensity:  severe Duration:  15 minutes with O2 Frequency:  Only occurs if there is change in weather/barometric pressure.  Also, now she reports dizziness with her cluster headaches.  She has fallen twice due to the dizziness. She doesn't think the 300mg  daily of topiramate is helping.  ss frequent and intense.  They are manageable.    Current NSAIDs:  ASA 81mg  daily. Current analgesics:  none Current antihypertensive:  Coreg Current anti-epileptic:  none Current anti-CGRP:  Emgality Supplements:  Melatonin 20mg ; CoQ10 100mg ; D3 Other therapy:  100% O2 Other medications:  Synthroid Using CPAP Exercises regularly   HISTORY: I  Primary Stabbing Headache: Onset:  2011.  She was previously treated in [place], where she was diagnosed with primary stabbing headache. Location:  Right frontal region Quality:  stabbing Intensity:  10/10 Aura:  no Prodrome:  no Associated symptoms:  Some nausea if severe. No autonomic symptoms. Duration:  10 to 60 minutes Frequency:  4 times a week Triggers/exacerbating factors:  change in weather. Relieving factors:  none Activity:  Cannot function when experiencing it.   II Cluster headache:  She began having new intractable headaches in late Decemeber 2019.  Her right  eye gets bloodshot and right nare runs.  Right side of face gets red.  They stabbing headache starts above the right eye and radiates, constant for a month, fluctuating in intensity.  She [facility] to [facility] ED on 08/11/18.  CT of head and CTA of head and neck were performed and personally reviewed, showing no acute abnormality or aneurysm.  Sed rate from 08/22/18 was 24.  Past management:  Prednisone taper, headache cocktail               III  Migraines:  They are bi-frontal/maxillary and associated with slight nausea.  She has had this before when her allergies "act up."     IV  Diabetic neuropathy:  Secondary to Type 1 diabetes.   V  Complex regional pain syndrome:  following an accident where she fell down the steps and crushed her right leg.  She takes gabapentin, tramadol and has a spinal nerve stimulator.  She takes this for her painful diabetic neuropathy as well.   VI  Right arm pain and numbness:  Since 2015, she has had episodes of right arm pain and numbness.  It only occurs when she is laying in bed, and occurs no matter what position.  Her entire arm goes "dead".  It is both numb, painful and unable to move it.  There is no shooting pain down the arm from the neck, however she gets occasional shooting pain from the right side of her neck into the shoulder.  She has to use her other arm to shake it out and it resolves in a minute or two.  It occurs every night.  She  was sent to pain management for possible cervical radiculopathy or thoracic outlet syndrome.  She did receive injections, which helped.  She had an MRI of the cervical and thoracic spine on 06/12/14.  Imaging not available, but report mentions diffuse facet arthropathy and degenerative changes in the cervical spine, but no nerve root impingement or cord compression.  There was no evidence of thoracic outlet syndrome.  She was sent to [facility] for further evaluation.  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  They told her that her symptoms were related to her diabetic neuropathy.   VII  Cerebral aneurysm:  In 2009, she had a CTA of the head which reportedly showed a 1-21mm an eurysm at the junction of right A1 and A2 segment of the ACA.  However, a repeat CTA performed on 02/22/12 did not reveal any aneurysm.     She cannot have an MRI due to spinal stimulator.   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 02/2018: s/p DES to Proximal LAD with residual 20% RCA stenosis.   Chronic pain    Diabetes mellitus without complication (HCC)    Headache    History of left bundle branch block (LBBB)    HTN (hypertension)    Hx of cardiovascular stress test 10/2016   intermediate risk study   Hypothyroidism    Neuromuscular disorder (HCC)    Neuropathy    Obesity    OSA on CPAP 03/10/2015   Retinopathy    Sleep apnea    Varicose veins of both lower extremities     MEDICATIONS: Current Outpatient Medications on File Prior to Visit  Medication Sig Dispense Refill   aspirin EC 81 MG tablet Take 1 tablet (81 mg total) by mouth daily. Swallow whole. 90 tablet 3   atorvastatin (LIPITOR) 80 MG tablet TAKE ONE TABLET BY MOUTH DAILY AT 6 PM 90 tablet 3   carvedilol (COREG) 3.125 MG tablet TAKE ONE TABLET BY MOUTH 1 HOUR BEFORE CARDIAC REHAB 90 tablet 2   cetirizine (ZYRTEC) 10 MG tablet Take 1 tablet (10 mg total) by mouth daily. 30 tablet 5    Continuous Blood Gluc Sensor (DEXCOM G6 SENSOR) MISC 1 Device by Other route as directed.     EPINEPHrine 0.3 mg/0.3 mL IJ SOAJ injection Inject 0.3 mg into the muscle as needed for anaphylaxis.  1   furosemide (LASIX) 20 MG tablet Take 20-40 mg by mouth See admin instructions. Take 40 mg in the morning and 20 mg in the evening     Insulin Human (INSULIN PUMP) SOLN Inject into the skin continuous.      levothyroxine (SYNTHROID) 112 MCG tablet Take 112 mcg by mouth daily before breakfast.     lisinopril (ZESTRIL) 10 MG tablet Take 10 mg by mouth daily.     loratadine (CLARITIN) 10 MG tablet Take 10 mg by mouth daily.     metFORMIN (GLUCOPHAGE) 500 MG tablet Take 1 tablet (500  mg total) by mouth 2 (two) times daily with a meal.     nitroGLYCERIN (NITROSTAT) 0.4 MG SL tablet Place 1 tablet (0.4 mg total) under the tongue every 5 (five) minutes as needed for chest pain. 25 tablet 3   NOVOLOG 100 UNIT/ML injection Inject into the skin continuous. In insulin pump  0   OXYGEN Inhale into the lungs. As needed     pantoprazole (PROTONIX) 40 MG tablet Take 40 mg by mouth daily.     No current facility-administered medications on file prior to visit.    ALLERGIES: Allergies  Allergen Reactions   Other Nausea And Vomiting and Other (See Comments)    Opiates Alters mental status diarrhea   Fentanyl Nausea And Vomiting and Other (See Comments)    Nausea, vomiting, loss of consciousness, requiring reversal    FAMILY HISTORY: Family History  Problem Relation Age of Onset   Cancer Mother 60       colon   Cancer Father        renal cell   Cancer Brother        bladder   Cancer Sister        primary brain, and primary breast too   Cancer Sister        breast    Cancer Other        multiple aunts with breast, cervical, or colon cancer      Objective:  Blood pressure 136/67, pulse 82, height 5\' 9"  (1.753 m), weight 245 lb (111.1 kg), SpO2 99 %. General: No acute distress.  Patient appears  well-groomed.    Metta Clines, DO  CC: Monico Blitz, MD

## 2021-07-28 ENCOUNTER — Encounter: Payer: Self-pay | Admitting: Neurology

## 2021-07-28 ENCOUNTER — Other Ambulatory Visit: Payer: Self-pay

## 2021-07-28 ENCOUNTER — Ambulatory Visit (INDEPENDENT_AMBULATORY_CARE_PROVIDER_SITE_OTHER): Payer: Medicare Other | Admitting: Neurology

## 2021-07-28 VITALS — BP 136/67 | HR 82 | Ht 69.0 in | Wt 245.0 lb

## 2021-07-28 DIAGNOSIS — I2510 Atherosclerotic heart disease of native coronary artery without angina pectoris: Secondary | ICD-10-CM | POA: Diagnosis not present

## 2021-07-28 DIAGNOSIS — G44019 Episodic cluster headache, not intractable: Secondary | ICD-10-CM | POA: Diagnosis not present

## 2021-07-28 DIAGNOSIS — G43009 Migraine without aura, not intractable, without status migrainosus: Secondary | ICD-10-CM | POA: Diagnosis not present

## 2021-07-28 MED ORDER — EMGALITY 120 MG/ML ~~LOC~~ SOAJ
120.0000 mg | SUBCUTANEOUS | 5 refills | Status: DC
Start: 2021-07-28 — End: 2021-10-12

## 2021-07-28 NOTE — Patient Instructions (Signed)
Continue Emgality every 28 days Oxygen as needed/directed

## 2021-07-31 ENCOUNTER — Other Ambulatory Visit: Payer: Self-pay | Admitting: Student

## 2021-08-02 ENCOUNTER — Other Ambulatory Visit: Payer: Self-pay | Admitting: *Deleted

## 2021-08-02 MED ORDER — ATORVASTATIN CALCIUM 80 MG PO TABS: ORAL_TABLET | ORAL | 3 refills | Status: DC

## 2021-08-03 ENCOUNTER — Other Ambulatory Visit: Payer: Self-pay

## 2021-08-03 ENCOUNTER — Ambulatory Visit (INDEPENDENT_AMBULATORY_CARE_PROVIDER_SITE_OTHER): Payer: Medicare Other

## 2021-08-03 DIAGNOSIS — J309 Allergic rhinitis, unspecified: Secondary | ICD-10-CM | POA: Diagnosis not present

## 2021-08-03 MED ORDER — EPINEPHRINE 0.3 MG/0.3ML IJ SOAJ: 0.3000 mg | INTRAMUSCULAR | 1 refills | Status: DC | PRN

## 2021-08-03 NOTE — Progress Notes (Signed)
Immunotherapy   Patient Details  Name: Jodi Ray MRN: [id number] Date of Birth: 1961/01/18  08/03/2021  [place] started injections for  G-W-T-C-D and DM-Molds With an expiration of 07/14/2022. Patient received 0.05 ml of both her Blue vials. Following schedule: A  Frequency:1 time per week Epi-Pen:Epi-Pen Available  Consent signed and patient instructions given.   Herbie Drape 08/03/2021, 9:22 AM

## 2021-08-05 ENCOUNTER — Other Ambulatory Visit: Payer: Self-pay | Admitting: Neurology

## 2021-08-09 DIAGNOSIS — Z299 Encounter for prophylactic measures, unspecified: Secondary | ICD-10-CM | POA: Diagnosis not present

## 2021-08-09 DIAGNOSIS — M14679 Charcot's joint, unspecified ankle and foot: Secondary | ICD-10-CM | POA: Diagnosis not present

## 2021-08-09 DIAGNOSIS — Z6838 Body mass index (BMI) 38.0-38.9, adult: Secondary | ICD-10-CM | POA: Diagnosis not present

## 2021-08-09 DIAGNOSIS — E0842 Diabetes mellitus due to underlying condition with diabetic polyneuropathy: Secondary | ICD-10-CM | POA: Diagnosis not present

## 2021-08-09 DIAGNOSIS — E1165 Type 2 diabetes mellitus with hyperglycemia: Secondary | ICD-10-CM | POA: Diagnosis not present

## 2021-08-09 DIAGNOSIS — I10 Essential (primary) hypertension: Secondary | ICD-10-CM | POA: Diagnosis not present

## 2021-08-17 ENCOUNTER — Ambulatory Visit (INDEPENDENT_AMBULATORY_CARE_PROVIDER_SITE_OTHER): Payer: Medicare Other

## 2021-08-17 DIAGNOSIS — J309 Allergic rhinitis, unspecified: Secondary | ICD-10-CM | POA: Diagnosis not present

## 2021-08-22 DIAGNOSIS — R109 Unspecified abdominal pain: Secondary | ICD-10-CM | POA: Diagnosis not present

## 2021-08-24 ENCOUNTER — Ambulatory Visit (INDEPENDENT_AMBULATORY_CARE_PROVIDER_SITE_OTHER): Payer: Medicare Other

## 2021-08-24 DIAGNOSIS — J309 Allergic rhinitis, unspecified: Secondary | ICD-10-CM

## 2021-08-31 ENCOUNTER — Ambulatory Visit (INDEPENDENT_AMBULATORY_CARE_PROVIDER_SITE_OTHER): Payer: Medicare Other

## 2021-08-31 DIAGNOSIS — J309 Allergic rhinitis, unspecified: Secondary | ICD-10-CM

## 2021-09-07 ENCOUNTER — Other Ambulatory Visit (HOSPITAL_COMMUNITY)
Admission: RE | Admit: 2021-09-07 | Discharge: 2021-09-07 | Disposition: A | Discharge status: Home / Self Care | Payer: Medicare Other | Source: Ambulatory Visit | Attending: Nephrology | Admitting: Nephrology

## 2021-09-07 ENCOUNTER — Other Ambulatory Visit: Payer: Self-pay

## 2021-09-07 ENCOUNTER — Ambulatory Visit (INDEPENDENT_AMBULATORY_CARE_PROVIDER_SITE_OTHER): Payer: Medicare Other

## 2021-09-07 DIAGNOSIS — I129 Hypertensive chronic kidney disease with stage 1 through stage 4 chronic kidney disease, or unspecified chronic kidney disease: Secondary | ICD-10-CM | POA: Diagnosis not present

## 2021-09-07 DIAGNOSIS — E211 Secondary hyperparathyroidism, not elsewhere classified: Secondary | ICD-10-CM | POA: Insufficient documentation

## 2021-09-07 DIAGNOSIS — J309 Allergic rhinitis, unspecified: Secondary | ICD-10-CM

## 2021-09-07 DIAGNOSIS — E1129 Type 2 diabetes mellitus with other diabetic kidney complication: Secondary | ICD-10-CM | POA: Diagnosis not present

## 2021-09-07 DIAGNOSIS — E1122 Type 2 diabetes mellitus with diabetic chronic kidney disease: Secondary | ICD-10-CM | POA: Diagnosis not present

## 2021-09-07 DIAGNOSIS — I5032 Chronic diastolic (congestive) heart failure: Secondary | ICD-10-CM | POA: Diagnosis not present

## 2021-09-07 DIAGNOSIS — N189 Chronic kidney disease, unspecified: Secondary | ICD-10-CM | POA: Diagnosis not present

## 2021-09-07 DIAGNOSIS — R809 Proteinuria, unspecified: Secondary | ICD-10-CM | POA: Insufficient documentation

## 2021-09-07 LAB — CBC
HCT: 36.6 % (ref 36.0–46.0)
Hemoglobin: 11.6 g/dL — ABNORMAL LOW (ref 12.0–15.0)
MCH: 29.4 pg (ref 26.0–34.0)
MCHC: 31.7 g/dL (ref 30.0–36.0)
MCV: 92.9 fL (ref 80.0–100.0)
Platelets: 201 10*3/uL (ref 150–400)
RBC: 3.94 MIL/uL (ref 3.87–5.11)
RDW: 14.6 % (ref 11.5–15.5)
WBC: 8 10*3/uL (ref 4.0–10.5)
nRBC: 0 % (ref 0.0–0.2)

## 2021-09-07 LAB — RENAL FUNCTION PANEL
Albumin: 4 g/dL (ref 3.5–5.0)
Anion gap: 9 (ref 5–15)
BUN: 92 mg/dL — ABNORMAL HIGH (ref 8–23)
CO2: 23 mmol/L (ref 22–32)
Calcium: 9.2 mg/dL (ref 8.9–10.3)
Chloride: 106 mmol/L (ref 98–111)
Creatinine, Ser: 2.16 mg/dL — ABNORMAL HIGH (ref 0.44–1.00)
GFR, Estimated: 25 mL/min — ABNORMAL LOW (ref >60)
Glucose, Bld: 89 mg/dL (ref 70–99)
Phosphorus: 5.1 mg/dL — ABNORMAL HIGH (ref 2.5–4.6)
Potassium: 5.1 mmol/L (ref 3.5–5.1)
Sodium: 138 mmol/L (ref 135–145)

## 2021-09-07 LAB — PROTEIN / CREATININE RATIO, URINE
Creatinine, Urine: 34.87 mg/dL
Protein Creatinine Ratio: 0.23 mg/mg{Cre} — ABNORMAL HIGH (ref 0.00–0.15)
Total Protein, Urine: 8 mg/dL

## 2021-09-09 DIAGNOSIS — E1129 Type 2 diabetes mellitus with other diabetic kidney complication: Secondary | ICD-10-CM | POA: Diagnosis not present

## 2021-09-09 DIAGNOSIS — E1122 Type 2 diabetes mellitus with diabetic chronic kidney disease: Secondary | ICD-10-CM | POA: Diagnosis not present

## 2021-09-09 DIAGNOSIS — I129 Hypertensive chronic kidney disease with stage 1 through stage 4 chronic kidney disease, or unspecified chronic kidney disease: Secondary | ICD-10-CM | POA: Diagnosis not present

## 2021-09-09 DIAGNOSIS — R809 Proteinuria, unspecified: Secondary | ICD-10-CM | POA: Diagnosis not present

## 2021-09-09 DIAGNOSIS — I5032 Chronic diastolic (congestive) heart failure: Secondary | ICD-10-CM | POA: Diagnosis not present

## 2021-09-09 DIAGNOSIS — E211 Secondary hyperparathyroidism, not elsewhere classified: Secondary | ICD-10-CM | POA: Diagnosis not present

## 2021-09-09 DIAGNOSIS — N189 Chronic kidney disease, unspecified: Secondary | ICD-10-CM | POA: Diagnosis not present

## 2021-09-09 DIAGNOSIS — N19 Unspecified kidney failure: Secondary | ICD-10-CM | POA: Diagnosis not present

## 2021-09-14 ENCOUNTER — Ambulatory Visit (INDEPENDENT_AMBULATORY_CARE_PROVIDER_SITE_OTHER): Payer: Medicare Other | Admitting: *Deleted

## 2021-09-14 DIAGNOSIS — J309 Allergic rhinitis, unspecified: Secondary | ICD-10-CM

## 2021-09-20 DIAGNOSIS — E1151 Type 2 diabetes mellitus with diabetic peripheral angiopathy without gangrene: Secondary | ICD-10-CM | POA: Diagnosis not present

## 2021-09-20 DIAGNOSIS — E1140 Type 2 diabetes mellitus with diabetic neuropathy, unspecified: Secondary | ICD-10-CM | POA: Diagnosis not present

## 2021-09-21 ENCOUNTER — Ambulatory Visit (INDEPENDENT_AMBULATORY_CARE_PROVIDER_SITE_OTHER): Payer: Medicare Other

## 2021-09-21 DIAGNOSIS — J309 Allergic rhinitis, unspecified: Secondary | ICD-10-CM

## 2021-09-29 DIAGNOSIS — E039 Hypothyroidism, unspecified: Secondary | ICD-10-CM | POA: Diagnosis not present

## 2021-09-29 DIAGNOSIS — G4733 Obstructive sleep apnea (adult) (pediatric): Secondary | ICD-10-CM | POA: Diagnosis not present

## 2021-09-29 DIAGNOSIS — Z7984 Long term (current) use of oral hypoglycemic drugs: Secondary | ICD-10-CM | POA: Diagnosis not present

## 2021-09-29 DIAGNOSIS — N2581 Secondary hyperparathyroidism of renal origin: Secondary | ICD-10-CM | POA: Diagnosis not present

## 2021-09-29 DIAGNOSIS — I951 Orthostatic hypotension: Secondary | ICD-10-CM | POA: Diagnosis not present

## 2021-09-29 DIAGNOSIS — E1122 Type 2 diabetes mellitus with diabetic chronic kidney disease: Secondary | ICD-10-CM | POA: Diagnosis not present

## 2021-09-29 DIAGNOSIS — N184 Chronic kidney disease, stage 4 (severe): Secondary | ICD-10-CM | POA: Diagnosis not present

## 2021-09-29 DIAGNOSIS — E11319 Type 2 diabetes mellitus with unspecified diabetic retinopathy without macular edema: Secondary | ICD-10-CM | POA: Diagnosis not present

## 2021-09-29 DIAGNOSIS — I2510 Atherosclerotic heart disease of native coronary artery without angina pectoris: Secondary | ICD-10-CM | POA: Diagnosis not present

## 2021-09-29 DIAGNOSIS — Z7982 Long term (current) use of aspirin: Secondary | ICD-10-CM | POA: Diagnosis not present

## 2021-09-29 DIAGNOSIS — I129 Hypertensive chronic kidney disease with stage 1 through stage 4 chronic kidney disease, or unspecified chronic kidney disease: Secondary | ICD-10-CM | POA: Diagnosis not present

## 2021-09-29 DIAGNOSIS — E1142 Type 2 diabetes mellitus with diabetic polyneuropathy: Secondary | ICD-10-CM | POA: Diagnosis not present

## 2021-09-29 DIAGNOSIS — Z79890 Hormone replacement therapy (postmenopausal): Secondary | ICD-10-CM | POA: Diagnosis not present

## 2021-09-29 DIAGNOSIS — Z79899 Other long term (current) drug therapy: Secondary | ICD-10-CM | POA: Diagnosis not present

## 2021-09-29 DIAGNOSIS — E785 Hyperlipidemia, unspecified: Secondary | ICD-10-CM | POA: Diagnosis not present

## 2021-09-29 DIAGNOSIS — Z8639 Personal history of other endocrine, nutritional and metabolic disease: Secondary | ICD-10-CM | POA: Diagnosis not present

## 2021-09-29 DIAGNOSIS — D631 Anemia in chronic kidney disease: Secondary | ICD-10-CM | POA: Diagnosis not present

## 2021-09-29 DIAGNOSIS — K3184 Gastroparesis: Secondary | ICD-10-CM | POA: Diagnosis not present

## 2021-09-29 DIAGNOSIS — E1143 Type 2 diabetes mellitus with diabetic autonomic (poly)neuropathy: Secondary | ICD-10-CM | POA: Diagnosis not present

## 2021-09-29 DIAGNOSIS — N1832 Chronic kidney disease, stage 3b: Secondary | ICD-10-CM | POA: Diagnosis not present

## 2021-10-04 DIAGNOSIS — E109 Type 1 diabetes mellitus without complications: Secondary | ICD-10-CM | POA: Diagnosis not present

## 2021-10-12 ENCOUNTER — Ambulatory Visit: Payer: Self-pay | Admitting: *Deleted

## 2021-10-12 ENCOUNTER — Encounter: Payer: Self-pay | Admitting: Allergy & Immunology

## 2021-10-12 ENCOUNTER — Other Ambulatory Visit: Payer: Self-pay

## 2021-10-12 ENCOUNTER — Ambulatory Visit (INDEPENDENT_AMBULATORY_CARE_PROVIDER_SITE_OTHER): Payer: Medicare Other | Admitting: Allergy & Immunology

## 2021-10-12 ENCOUNTER — Other Ambulatory Visit (HOSPITAL_COMMUNITY)
Admission: RE | Admit: 2021-10-12 | Discharge: 2021-10-12 | Disposition: A | Discharge status: Home / Self Care | Payer: Medicare Other | Source: Ambulatory Visit | Attending: Nephrology | Admitting: Nephrology

## 2021-10-12 VITALS — BP 130/60 | HR 91 | Temp 98.5°F | Resp 16 | Ht 69.0 in | Wt 245.0 lb

## 2021-10-12 DIAGNOSIS — J302 Other seasonal allergic rhinitis: Secondary | ICD-10-CM

## 2021-10-12 DIAGNOSIS — I129 Hypertensive chronic kidney disease with stage 1 through stage 4 chronic kidney disease, or unspecified chronic kidney disease: Secondary | ICD-10-CM | POA: Diagnosis not present

## 2021-10-12 DIAGNOSIS — N19 Unspecified kidney failure: Secondary | ICD-10-CM | POA: Insufficient documentation

## 2021-10-12 DIAGNOSIS — N189 Chronic kidney disease, unspecified: Secondary | ICD-10-CM | POA: Diagnosis not present

## 2021-10-12 DIAGNOSIS — N179 Acute kidney failure, unspecified: Secondary | ICD-10-CM

## 2021-10-12 DIAGNOSIS — N1831 Chronic kidney disease, stage 3a: Secondary | ICD-10-CM | POA: Diagnosis not present

## 2021-10-12 DIAGNOSIS — Z79899 Other long term (current) drug therapy: Secondary | ICD-10-CM

## 2021-10-12 DIAGNOSIS — I5032 Chronic diastolic (congestive) heart failure: Secondary | ICD-10-CM | POA: Insufficient documentation

## 2021-10-12 DIAGNOSIS — R809 Proteinuria, unspecified: Secondary | ICD-10-CM | POA: Insufficient documentation

## 2021-10-12 DIAGNOSIS — J309 Allergic rhinitis, unspecified: Secondary | ICD-10-CM

## 2021-10-12 DIAGNOSIS — E211 Secondary hyperparathyroidism, not elsewhere classified: Secondary | ICD-10-CM | POA: Insufficient documentation

## 2021-10-12 DIAGNOSIS — E1129 Type 2 diabetes mellitus with other diabetic kidney complication: Secondary | ICD-10-CM | POA: Diagnosis not present

## 2021-10-12 DIAGNOSIS — E1122 Type 2 diabetes mellitus with diabetic chronic kidney disease: Secondary | ICD-10-CM | POA: Diagnosis not present

## 2021-10-12 LAB — CBC
HCT: 33.6 % — ABNORMAL LOW (ref 36.0–46.0)
Hemoglobin: 10.8 g/dL — ABNORMAL LOW (ref 12.0–15.0)
MCH: 30.2 pg (ref 26.0–34.0)
MCHC: 32.1 g/dL (ref 30.0–36.0)
MCV: 93.9 fL (ref 80.0–100.0)
Platelets: 201 10*3/uL (ref 150–400)
RBC: 3.58 MIL/uL — ABNORMAL LOW (ref 3.87–5.11)
RDW: 14.2 % (ref 11.5–15.5)
WBC: 6.3 10*3/uL (ref 4.0–10.5)
nRBC: 0 % (ref 0.0–0.2)

## 2021-10-12 LAB — PROTEIN / CREATININE RATIO, URINE
Creatinine, Urine: 19.9 mg/dL
Protein Creatinine Ratio: 0.4 mg/mg{Cre} — ABNORMAL HIGH (ref 0.00–0.15)
Total Protein, Urine: 8 mg/dL

## 2021-10-12 LAB — RENAL FUNCTION PANEL
Albumin: 3.9 g/dL (ref 3.5–5.0)
Anion gap: 11 (ref 5–15)
BUN: 95 mg/dL — ABNORMAL HIGH (ref 8–23)
CO2: 27 mmol/L (ref 22–32)
Calcium: 9.1 mg/dL (ref 8.9–10.3)
Chloride: 102 mmol/L (ref 98–111)
Creatinine, Ser: 2.26 mg/dL — ABNORMAL HIGH (ref 0.44–1.00)
GFR, Estimated: 24 mL/min — ABNORMAL LOW (ref >60)
Glucose, Bld: 115 mg/dL — ABNORMAL HIGH (ref 70–99)
Phosphorus: 4.6 mg/dL (ref 2.5–4.6)
Potassium: 4.7 mmol/L (ref 3.5–5.1)
Sodium: 140 mmol/L (ref 135–145)

## 2021-10-12 NOTE — Progress Notes (Signed)
? ?FOLLOW UP ? ?Date of Service/Encounter:  10/12/21 ? ? ?Assessment:  ? ?Seasonal and perennial allergic rhinitis (grasses, ragweed, weeds, trees, indoor molds, outdoor molds, dust mites, cat, and dog) - interested in starting allergen immunotherapy ?  ?On beta blocker therapy ?  ?Complicated past medical history including renal failure as well as type 1 diabetes and migraines ?  ?GFR 24 (December 2022) - consider dose adjustment at the next visit if GFR has not increased above 31 again ?  ? ?Plan/Recommendations:  ? ?1. Chronic rhinitis (grasses, ragweed, weeds, trees, indoor molds, outdoor molds, dust mites, cat, and dog) ?- Continue with shots at the same schedule. ?- Hopefully this time next year will be even better with the higher dose allergen vials that you are going to be on.  ?- Continue taking: Zyrtec (cetirizine) '10mg'$  tablet once daily ?- You can take a Claritin for breakthrough symptoms (this is the least metabolized through the kidneys)  ?- We will plan for 3-5 years treatment in total.  ? ?2. Return in about 6 months (around 04/14/2022).  ? ? ?Subjective:  ? ?Jodi Ray is a 61 y.o. female presenting today for follow up of  ?Chief Complaint  ?Patient presents with  ? Allergic Rhinitis   ?  Says she has some issues with her allergies due to the seasons changing.   ? ? ?Jodi Ray has a history of the following: ?Patient Active Problem List  ? Diagnosis Date Noted  ? Seasonal and perennial allergic rhinitis 07/13/2021  ? Diabetic ketoacidosis without coma associated with type 1 diabetes mellitus ([place]) 11/15/2019  ? Class 1 obesity 11/15/2019  ? Acute renal failure superimposed on stage 3 chronic kidney disease ([place]) 11/15/2019  ? CAD (coronary artery disease) 04/16/2018  ? Unstable angina ([place]) 03/21/2018  ? Atypical chest pain 03/20/2018  ? Gastritis and gastroduodenitis 03/05/2017  ? Suprapubic pain 03/05/2017  ? Right sided abdominal pain 10/24/2016  ? Diarrhea 10/24/2016  ? Family hx  of colon cancer 10/24/2016  ? Varicose veins of bilateral lower extremities with other complications 68/06/7516  ? Insomnia with sleep apnea 03/09/2016  ? Type I diabetes mellitus, uncontrolled 04/30/2015  ? Mixed hyperlipidemia 04/30/2015  ? Primary hypothyroidism 04/30/2015  ? Benign hypertension 04/30/2015  ? OSA on CPAP 03/10/2015  ? Migraine without aura and without status migrainosus, not intractable 01/08/2015  ? Primary stabbing headache 10/02/2014  ? ? ?History obtained from: chart review and patient. ? ?Jodi Ray is a 61 y.o. female presenting for a follow up visit.  She was last seen in December 2022.  This was for an evaluation of allergic rhinitis.  She had testing that was positive to grasses, ragweed, weeds, trees, indoor molds, outdoor molds, dust mite, cat, and dog.  We stopped her loratadine and started cetirizine 10 mg instead.  She was very antinose sprays.  She did make the decision to start allergen immunotherapy. ? ?Since last visit, she has largely done well. ? ?She recently drove to [place] for [employer]. She was driving and she noticed that her symptoms worsened when she was driving up there. Everything depends on what is blooming outdoors.  ? ?Allergic Rhinitis Symptom History: She was on allergy shots with her PCP at one point.  She reports that the cetirizine was rocking it. She is not a nose spray person at all.  She has not needed antibiotics.  Overall, symptoms have been under fair control.  Allergy shots are going well without adverse reactions. She  likes getting shots from [facility] the best, although all of our medical assistants are fantastic. ? ?[place] is on allergen immunotherapy. She receives two injections. Immunotherapy script #1 contains trees, weeds, grasses, cat, and dog. She currently receives 0.105m of the BLUE vial (1/100,000). Immunotherapy script #2 contains molds and dust mites. She currently receives 0.236mof the BLUE vial (1/100,000). She started shots January  of 2023 and not yet reached maintenance.  ? ?Her boyfriend is in the ICU for another round of sepsis.  Apparently, this was all triggered by a back surgery that became infected. ? ?Otherwise, there have been no changes to her past medical history, surgical history, family history, or social history. ? ? ? ?Review of Systems  ?Constitutional: Negative.  Negative for chills, fever, malaise/fatigue and weight loss.  ?HENT:  Negative for congestion, ear discharge, ear pain and sinus pain.   ?     Positive for postnasal drip.  ?Eyes:  Negative for pain, discharge and redness.  ?Respiratory:  Negative for cough, sputum production, shortness of breath and wheezing.   ?Cardiovascular: Negative.  Negative for chest pain and palpitations.  ?Gastrointestinal:  Negative for abdominal pain, constipation, diarrhea, heartburn, nausea and vomiting.  ?Skin: Negative.  Negative for itching and rash.  ?Neurological:  Negative for dizziness and headaches.  ?Endo/Heme/Allergies:  Negative for environmental allergies. Does not bruise/bleed easily.   ? ? ? ?Objective:  ? ?Blood pressure 130/60, pulse 91, temperature 98.5 ?F (36.9 ?C), temperature source Temporal, resp. rate 16, height '5\' 9"'$  (1.753 m), weight 245 lb (111.1 kg), SpO2 97 %. ?Body mass index is 36.18 kg/m?. ? ? ? ?Physical Exam ?Vitals reviewed.  ?Constitutional:   ?   Appearance: She is well-developed and overweight.  ?   Comments: Very talkative.  ?HENT:  ?   Head: Normocephalic and atraumatic.  ?   Right Ear: Tympanic membrane, ear canal and external ear normal. No drainage, swelling or tenderness. Tympanic membrane is not injected, scarred, erythematous, retracted or bulging.  ?   Left Ear: Tympanic membrane, ear canal and external ear normal. No drainage, swelling or tenderness. Tympanic membrane is not injected, scarred, erythematous, retracted or bulging.  ?   Nose: Mucosal edema and rhinorrhea present. No nasal deformity or septal deviation.  ?   Right Turbinates:  Enlarged. Not swollen or pale.  ?   Left Turbinates: Enlarged. Not swollen or pale.  ?   Right Sinus: No maxillary sinus tenderness or frontal sinus tenderness.  ?   Left Sinus: No maxillary sinus tenderness or frontal sinus tenderness.  ?   Comments: Clear rhinorrhea bilaterally. No nasal polyps.  ?   Mouth/Throat:  ?   Lips: Pink.  ?   Mouth: Mucous membranes are moist. Mucous membranes are not pale and not dry.  ?   Pharynx: Uvula midline.  ?   Comments: Cobblestoning in the posterior oropharynx.  ?Eyes:  ?   General:     ?   Right eye: No discharge.     ?   Left eye: No discharge.  ?   Conjunctiva/sclera: Conjunctivae normal.  ?   Right eye: Right conjunctiva is not injected. No chemosis. ?   Left eye: Left conjunctiva is not injected. No chemosis. ?   Pupils: Pupils are equal, round, and reactive to light.  ?Cardiovascular:  ?   Rate and Rhythm: Normal rate and regular rhythm.  ?   Heart sounds: Normal heart sounds.  ?Pulmonary:  ?   Effort: Pulmonary effort  is normal. No tachypnea, accessory muscle usage or respiratory distress.  ?   Breath sounds: Normal breath sounds. No wheezing, rhonchi or rales.  ?   Comments: Moving air well in all lung fields. No increased work of breathing noted.  ?Chest:  ?   Chest wall: No tenderness.  ?Lymphadenopathy:  ?   Head:  ?   Right side of head: No submandibular, tonsillar or occipital adenopathy.  ?   Left side of head: No submandibular, tonsillar or occipital adenopathy.  ?   Cervical: No cervical adenopathy.  ?Skin: ?   Coloration: Skin is not pale.  ?   Findings: No abrasion, erythema, petechiae or rash. Rash is not papular, urticarial or vesicular.  ?Neurological:  ?   Mental Status: She is alert.  ?Psychiatric:     ?   Behavior: Behavior is cooperative.  ?  ? ?Diagnostic studies: none ? ? ? ?  ?Salvatore Marvel, MD  ?[facility] ? ? ? ? ? ? ?

## 2021-10-12 NOTE — Patient Instructions (Addendum)
1. Chronic rhinitis (grasses, ragweed, weeds, trees, indoor molds, outdoor molds, dust mites, cat, and dog) ?- Continue with shots at the same schedule. ?- Hopefully this time next year will be even better with the higher dose allergen vials that you are going to be on.  ?- Continue taking: Zyrtec (cetirizine) '10mg'$  tablet once daily (can take an extra one if needed on bad days).  ?- You can use an extra dose of the antihistamine, if needed, for breakthrough symptoms.  ?- We will plan for 3-5 years treatment in total.  ? ?2. Return in about 6 months (around 04/14/2022).  ? ? ?Please inform us of any Emergency Department visits, hospitalizations, or changes in symptoms. Call us before going to the ED for breathing or allergy symptoms since we might be able to fit you in for a sick visit. Feel free to contact us anytime with any questions, problems, or concerns. ? ?It was a pleasure to see you again today! ? ?Websites that have reliable patient information: ?1. American Academy of Asthma, Allergy, and Immunology: www.aaaai.org ?2. Food Allergy Research and Education (FARE): foodallergy.org ?3. Mothers of Asthmatics: http://www.asthmacommunitynetwork.org ?4. [employer] of Allergy, Asthma, and Immunology: MonthlyElectricBill.co.uk ? ? ?COVID-19 Vaccine Information can be found at: ShippingScam.co.uk For questions related to vaccine distribution or appointments, please email [email or web address] or call [phone].  ? ?We realize that you might be concerned about having an allergic reaction to the COVID19 vaccines. To help with that concern, WE ARE OFFERING THE COVID19 VACCINES IN OUR OFFICE! Ask the front desk for dates!  ? ? ? ??Like? [place] on Facebook and Instagram for our latest updates!  ?  ? ? ?A healthy democracy works best when [employer] participate! Make sure you are registered to vote! If you have moved or changed any of your contact information, you will  need to get this updated before voting! ? ?In some cases, you MAY be able to register to vote online: [email or web address] ? ? ? ? ? ? ? ? ? ?

## 2021-10-13 LAB — PTH, INTACT AND CALCIUM
Calcium, Total (PTH): 9.3 mg/dL (ref 8.7–10.3)
PTH: 134 pg/mL — ABNORMAL HIGH (ref 15–65)

## 2021-10-17 DIAGNOSIS — E1165 Type 2 diabetes mellitus with hyperglycemia: Secondary | ICD-10-CM | POA: Diagnosis not present

## 2021-10-17 DIAGNOSIS — I10 Essential (primary) hypertension: Secondary | ICD-10-CM | POA: Diagnosis not present

## 2021-10-17 DIAGNOSIS — Z299 Encounter for prophylactic measures, unspecified: Secondary | ICD-10-CM | POA: Diagnosis not present

## 2021-10-17 DIAGNOSIS — Z6837 Body mass index (BMI) 37.0-37.9, adult: Secondary | ICD-10-CM | POA: Diagnosis not present

## 2021-10-17 DIAGNOSIS — L03031 Cellulitis of right toe: Secondary | ICD-10-CM | POA: Diagnosis not present

## 2021-10-17 DIAGNOSIS — E1122 Type 2 diabetes mellitus with diabetic chronic kidney disease: Secondary | ICD-10-CM | POA: Diagnosis not present

## 2021-10-17 DIAGNOSIS — Z789 Other specified health status: Secondary | ICD-10-CM | POA: Diagnosis not present

## 2021-10-19 ENCOUNTER — Ambulatory Visit (INDEPENDENT_AMBULATORY_CARE_PROVIDER_SITE_OTHER): Payer: Medicare Other

## 2021-10-19 DIAGNOSIS — J309 Allergic rhinitis, unspecified: Secondary | ICD-10-CM

## 2021-10-19 DIAGNOSIS — R809 Proteinuria, unspecified: Secondary | ICD-10-CM | POA: Diagnosis not present

## 2021-10-19 DIAGNOSIS — I129 Hypertensive chronic kidney disease with stage 1 through stage 4 chronic kidney disease, or unspecified chronic kidney disease: Secondary | ICD-10-CM | POA: Diagnosis not present

## 2021-10-19 DIAGNOSIS — I5032 Chronic diastolic (congestive) heart failure: Secondary | ICD-10-CM | POA: Diagnosis not present

## 2021-10-19 DIAGNOSIS — E1129 Type 2 diabetes mellitus with other diabetic kidney complication: Secondary | ICD-10-CM | POA: Diagnosis not present

## 2021-10-19 DIAGNOSIS — D638 Anemia in other chronic diseases classified elsewhere: Secondary | ICD-10-CM | POA: Diagnosis not present

## 2021-10-19 DIAGNOSIS — E1122 Type 2 diabetes mellitus with diabetic chronic kidney disease: Secondary | ICD-10-CM | POA: Diagnosis not present

## 2021-10-19 DIAGNOSIS — N189 Chronic kidney disease, unspecified: Secondary | ICD-10-CM | POA: Diagnosis not present

## 2021-10-24 ENCOUNTER — Ambulatory Visit: Payer: Medicare Other | Admitting: Adult Health

## 2021-10-25 ENCOUNTER — Encounter: Payer: Self-pay | Admitting: *Deleted

## 2021-10-25 DIAGNOSIS — L89892 Pressure ulcer of other site, stage 2: Secondary | ICD-10-CM | POA: Diagnosis not present

## 2021-10-25 DIAGNOSIS — M79671 Pain in right foot: Secondary | ICD-10-CM | POA: Diagnosis not present

## 2021-10-25 DIAGNOSIS — E1140 Type 2 diabetes mellitus with diabetic neuropathy, unspecified: Secondary | ICD-10-CM | POA: Diagnosis not present

## 2021-10-25 DIAGNOSIS — E1151 Type 2 diabetes mellitus with diabetic peripheral angiopathy without gangrene: Secondary | ICD-10-CM | POA: Diagnosis not present

## 2021-10-25 DIAGNOSIS — M79674 Pain in right toe(s): Secondary | ICD-10-CM | POA: Diagnosis not present

## 2021-10-26 ENCOUNTER — Ambulatory Visit (INDEPENDENT_AMBULATORY_CARE_PROVIDER_SITE_OTHER): Payer: Medicare Other

## 2021-10-26 DIAGNOSIS — J309 Allergic rhinitis, unspecified: Secondary | ICD-10-CM | POA: Diagnosis not present

## 2021-11-01 ENCOUNTER — Ambulatory Visit: Payer: Medicare Other | Admitting: Adult Health

## 2021-11-08 DIAGNOSIS — M79674 Pain in right toe(s): Secondary | ICD-10-CM | POA: Diagnosis not present

## 2021-11-08 DIAGNOSIS — E1140 Type 2 diabetes mellitus with diabetic neuropathy, unspecified: Secondary | ICD-10-CM | POA: Diagnosis not present

## 2021-11-08 DIAGNOSIS — L89892 Pressure ulcer of other site, stage 2: Secondary | ICD-10-CM | POA: Diagnosis not present

## 2021-11-08 DIAGNOSIS — M79671 Pain in right foot: Secondary | ICD-10-CM | POA: Diagnosis not present

## 2021-11-09 ENCOUNTER — Ambulatory Visit (INDEPENDENT_AMBULATORY_CARE_PROVIDER_SITE_OTHER): Payer: Medicare Other

## 2021-11-09 DIAGNOSIS — J309 Allergic rhinitis, unspecified: Secondary | ICD-10-CM

## 2021-11-16 ENCOUNTER — Ambulatory Visit (INDEPENDENT_AMBULATORY_CARE_PROVIDER_SITE_OTHER): Payer: Medicare Other

## 2021-11-16 DIAGNOSIS — J309 Allergic rhinitis, unspecified: Secondary | ICD-10-CM | POA: Diagnosis not present

## 2021-11-20 DIAGNOSIS — H60502 Unspecified acute noninfective otitis externa, left ear: Secondary | ICD-10-CM | POA: Diagnosis not present

## 2021-11-22 DIAGNOSIS — M79671 Pain in right foot: Secondary | ICD-10-CM | POA: Diagnosis not present

## 2021-11-22 DIAGNOSIS — L89892 Pressure ulcer of other site, stage 2: Secondary | ICD-10-CM | POA: Diagnosis not present

## 2021-11-22 DIAGNOSIS — E1151 Type 2 diabetes mellitus with diabetic peripheral angiopathy without gangrene: Secondary | ICD-10-CM | POA: Diagnosis not present

## 2021-11-22 DIAGNOSIS — M79674 Pain in right toe(s): Secondary | ICD-10-CM | POA: Diagnosis not present

## 2021-11-22 DIAGNOSIS — E1140 Type 2 diabetes mellitus with diabetic neuropathy, unspecified: Secondary | ICD-10-CM | POA: Diagnosis not present

## 2021-11-23 ENCOUNTER — Ambulatory Visit (INDEPENDENT_AMBULATORY_CARE_PROVIDER_SITE_OTHER): Payer: Medicare Other

## 2021-11-23 ENCOUNTER — Encounter: Payer: Self-pay | Admitting: Internal Medicine

## 2021-11-23 ENCOUNTER — Other Ambulatory Visit (HOSPITAL_COMMUNITY)
Admission: RE | Admit: 2021-11-23 | Discharge: 2021-11-23 | Disposition: A | Discharge status: Home / Self Care | Payer: Medicare Other | Source: Ambulatory Visit | Attending: Nephrology | Admitting: Nephrology

## 2021-11-23 DIAGNOSIS — R809 Proteinuria, unspecified: Secondary | ICD-10-CM | POA: Insufficient documentation

## 2021-11-23 DIAGNOSIS — I130 Hypertensive heart and chronic kidney disease with heart failure and stage 1 through stage 4 chronic kidney disease, or unspecified chronic kidney disease: Secondary | ICD-10-CM | POA: Insufficient documentation

## 2021-11-23 DIAGNOSIS — N189 Chronic kidney disease, unspecified: Secondary | ICD-10-CM | POA: Diagnosis not present

## 2021-11-23 DIAGNOSIS — I5032 Chronic diastolic (congestive) heart failure: Secondary | ICD-10-CM | POA: Diagnosis not present

## 2021-11-23 DIAGNOSIS — E211 Secondary hyperparathyroidism, not elsewhere classified: Secondary | ICD-10-CM | POA: Insufficient documentation

## 2021-11-23 DIAGNOSIS — E1122 Type 2 diabetes mellitus with diabetic chronic kidney disease: Secondary | ICD-10-CM | POA: Diagnosis not present

## 2021-11-23 DIAGNOSIS — J309 Allergic rhinitis, unspecified: Secondary | ICD-10-CM

## 2021-11-23 LAB — RENAL FUNCTION PANEL
Albumin: 4.1 g/dL (ref 3.5–5.0)
Anion gap: 10 (ref 5–15)
BUN: 69 mg/dL — ABNORMAL HIGH (ref 8–23)
CO2: 30 mmol/L (ref 22–32)
Calcium: 9.4 mg/dL (ref 8.9–10.3)
Chloride: 100 mmol/L (ref 98–111)
Creatinine, Ser: 2 mg/dL — ABNORMAL HIGH (ref 0.44–1.00)
GFR, Estimated: 28 mL/min — ABNORMAL LOW (ref >60)
Glucose, Bld: 109 mg/dL — ABNORMAL HIGH (ref 70–99)
Phosphorus: 4.4 mg/dL (ref 2.5–4.6)
Potassium: 4.5 mmol/L (ref 3.5–5.1)
Sodium: 140 mmol/L (ref 135–145)

## 2021-11-23 LAB — PROTEIN / CREATININE RATIO, URINE
Creatinine, Urine: 17.02 mg/dL
Total Protein, Urine: <6 mg/dL

## 2021-11-23 LAB — IRON AND TIBC
Iron: 93 ug/dL (ref 28–170)
Saturation Ratios: 23 % (ref 10.4–31.8)
TIBC: 405 ug/dL (ref 250–450)
UIBC: 312 ug/dL

## 2021-11-23 LAB — CBC
HCT: 34.2 % — ABNORMAL LOW (ref 36.0–46.0)
Hemoglobin: 11 g/dL — ABNORMAL LOW (ref 12.0–15.0)
MCH: 30.4 pg (ref 26.0–34.0)
MCHC: 32.2 g/dL (ref 30.0–36.0)
MCV: 94.5 fL (ref 80.0–100.0)
Platelets: 212 10*3/uL (ref 150–400)
RBC: 3.62 MIL/uL — ABNORMAL LOW (ref 3.87–5.11)
RDW: 13.7 % (ref 11.5–15.5)
WBC: 5.2 10*3/uL (ref 4.0–10.5)
nRBC: 0 % (ref 0.0–0.2)

## 2021-11-23 LAB — FOLATE: Folate: 18.9 ng/mL (ref >5.9)

## 2021-11-23 LAB — VITAMIN D 25 HYDROXY (VIT D DEFICIENCY, FRACTURES): Vit D, 25-Hydroxy: 42.61 ng/mL (ref 30–100)

## 2021-11-23 LAB — VITAMIN B12: Vitamin B-12: 342 pg/mL (ref 180–914)

## 2021-11-23 LAB — FERRITIN: Ferritin: 28 ng/mL (ref 11–307)

## 2021-11-24 LAB — PTH, INTACT AND CALCIUM
Calcium, Total (PTH): 9.5 mg/dL (ref 8.7–10.3)
PTH: 156 pg/mL — ABNORMAL HIGH (ref 15–65)

## 2021-11-29 DIAGNOSIS — E1151 Type 2 diabetes mellitus with diabetic peripheral angiopathy without gangrene: Secondary | ICD-10-CM | POA: Diagnosis not present

## 2021-11-29 DIAGNOSIS — E1140 Type 2 diabetes mellitus with diabetic neuropathy, unspecified: Secondary | ICD-10-CM | POA: Diagnosis not present

## 2021-11-30 ENCOUNTER — Ambulatory Visit (INDEPENDENT_AMBULATORY_CARE_PROVIDER_SITE_OTHER): Payer: Medicare Other

## 2021-11-30 DIAGNOSIS — J309 Allergic rhinitis, unspecified: Secondary | ICD-10-CM

## 2021-12-01 DIAGNOSIS — D638 Anemia in other chronic diseases classified elsewhere: Secondary | ICD-10-CM | POA: Diagnosis not present

## 2021-12-01 DIAGNOSIS — E1129 Type 2 diabetes mellitus with other diabetic kidney complication: Secondary | ICD-10-CM | POA: Diagnosis not present

## 2021-12-01 DIAGNOSIS — E1122 Type 2 diabetes mellitus with diabetic chronic kidney disease: Secondary | ICD-10-CM | POA: Diagnosis not present

## 2021-12-01 DIAGNOSIS — E6609 Other obesity due to excess calories: Secondary | ICD-10-CM | POA: Diagnosis not present

## 2021-12-01 DIAGNOSIS — R809 Proteinuria, unspecified: Secondary | ICD-10-CM | POA: Diagnosis not present

## 2021-12-01 DIAGNOSIS — N189 Chronic kidney disease, unspecified: Secondary | ICD-10-CM | POA: Diagnosis not present

## 2021-12-01 DIAGNOSIS — I129 Hypertensive chronic kidney disease with stage 1 through stage 4 chronic kidney disease, or unspecified chronic kidney disease: Secondary | ICD-10-CM | POA: Diagnosis not present

## 2021-12-01 DIAGNOSIS — E211 Secondary hyperparathyroidism, not elsewhere classified: Secondary | ICD-10-CM | POA: Diagnosis not present

## 2021-12-01 DIAGNOSIS — I5032 Chronic diastolic (congestive) heart failure: Secondary | ICD-10-CM | POA: Diagnosis not present

## 2021-12-07 ENCOUNTER — Encounter: Payer: Self-pay | Admitting: Adult Health

## 2021-12-07 ENCOUNTER — Ambulatory Visit (INDEPENDENT_AMBULATORY_CARE_PROVIDER_SITE_OTHER): Payer: Medicare Other | Admitting: Adult Health

## 2021-12-07 ENCOUNTER — Ambulatory Visit (INDEPENDENT_AMBULATORY_CARE_PROVIDER_SITE_OTHER): Payer: Medicare Other

## 2021-12-07 VITALS — BP 123/63 | HR 82 | Ht 68.0 in | Wt 240.0 lb

## 2021-12-07 DIAGNOSIS — G4733 Obstructive sleep apnea (adult) (pediatric): Secondary | ICD-10-CM

## 2021-12-07 DIAGNOSIS — J309 Allergic rhinitis, unspecified: Secondary | ICD-10-CM

## 2021-12-07 DIAGNOSIS — Z9989 Dependence on other enabling machines and devices: Secondary | ICD-10-CM | POA: Diagnosis not present

## 2021-12-07 NOTE — Patient Instructions (Signed)
Continue using CPAP nightly and greater than 4 hours each night °If your symptoms worsen or you develop new symptoms please let us know.  ° °

## 2021-12-07 NOTE — Progress Notes (Signed)
? ? ?PATIENT: Jodi Ray ?DOB: Nov 12, 1960 ? ?REASON FOR VISIT: follow up ?HISTORY FROM: patient ? ?HISTORY OF PRESENT ILLNESS: ?Today 12/07/21: ? ?Jodi Ray is a 61 year old female with a history of OSA on CPAP. Denies any new issues. She returns today for follow-up.  ? ? ? ?10/21/20: Jodi Ray is a 61 year old female with a history of obstructive sleep apnea on CPAP.  She returns today for follow-up.  We were unable to get a download today.  The patient has to take her machine to [place] in order for them to pull the data.  Reports that she will do that within the next week.  Reports that she uses the CPAP nightly.  She denies any new issues.  Reports that she does need a new filter and tubing.  Returns today for an evaluation. ? ?10/22/19: ?Jodi Ray is a 61 year old female with a history of obstructive sleep apnea on CPAP.  Her download indicates that she use her machine nightly for compliance of 100%.  She use her machine greater than 4 hours each night.  On average she uses her machine 8 hours and 17 minutes.  Her residual AHI is 0.5 on 11.6 cm of water with EPR 2.  She reports that her CPAP is working well for her.  She does use a cloth mask and reports that she has a hard time getting this every 3 months from her DME company. ? ?HISTORY (Copied from Dr.Dohmeier's note) ?04-24-2019. ? this patient returns a sleep study and is now on CPAP with good compliance.  She gets new supplies, a new sleep study was not ordered. The current settings are 11.6 cm water with 2 cm EPR, 100% compliance.  ?This is been better has used her machine every day of the last 30 days between 25 August on 23 April 2019 average usage time 7 hours and 43 minutes.  Her residual AHI is 0.6/h she does have moderate air leaks, she does have obstructive sleep apnea and rare central events.  Her Epworth Sleepiness Scale has been endorsed at only 2 points at her fatigue severity at 9 points her geriatric depression score was  endorsed at 9 out of 15 points. ? ?REVIEW OF SYSTEMS: Out of a complete 14 system review of symptoms, the patient complains only of the following symptoms, and all other reviewed systems are negative. ? ?ESS 1 ? ? ?ALLERGIES: ?Allergies  ?Allergen Reactions  ? Other Nausea And Vomiting and Other (See Comments)  ?  Opiates ?Alters mental status ?diarrhea  ? Fentanyl Nausea And Vomiting and Other (See Comments)  ?  Nausea, vomiting, loss of consciousness, requiring reversal  ? ? ?HOME MEDICATIONS: ?Outpatient Medications Prior to Visit  ?Medication Sig Dispense Refill  ? aspirin EC 81 MG tablet Take 1 tablet (81 mg total) by mouth daily. Swallow whole. 90 tablet 3  ? atorvastatin (LIPITOR) 80 MG tablet TAKE ONE TABLET BY MOUTH DAILY AT 6 PM 90 tablet 3  ? carvedilol (COREG) 3.125 MG tablet TAKE ONE TABLET BY MOUTH 1 HOUR BEFORE CARDIAC REHAB 90 tablet 2  ? cetirizine (ZYRTEC) 10 MG tablet Take 10 mg by mouth daily.    ? Continuous Blood Gluc Sensor (DEXCOM G6 SENSOR) MISC 1 Device by Other route as directed.    ? EPINEPHrine 0.3 mg/0.3 mL IJ SOAJ injection Inject 0.3 mg into the muscle as needed for anaphylaxis. 2 each 1  ? fish oil-omega-3 fatty acids 1000 MG capsule Take by mouth.    ?  glucose blood ([employer] BLOOD GLUCOSE TEST) test strip 1 strip    ? Insulin Human (INSULIN PUMP) SOLN Inject into the skin continuous.     ? JARDIANCE 10 MG TABS tablet Take 10 mg by mouth every morning. Take a 1/2 tablet daily    ? levothyroxine (SYNTHROID) 112 MCG tablet Take 112 mcg by mouth daily before breakfast.    ? lisinopril (ZESTRIL) 10 MG tablet Take 10 mg by mouth daily.    ? metFORMIN (GLUCOPHAGE) 500 MG tablet Take 1 tablet (500 mg total) by mouth 2 (two) times daily with a meal.    ? nitroGLYCERIN (NITROSTAT) 0.4 MG SL tablet Place 1 tablet (0.4 mg total) under the tongue every 5 (five) minutes as needed for chest pain. 25 tablet 3  ? NOVOLOG 100 UNIT/ML injection Inject into the skin continuous. In insulin pump  0  ?  OXYGEN Inhale into the lungs. As needed    ? pantoprazole (PROTONIX) 40 MG tablet Take 40 mg by mouth daily.    ? VITAMIN A OP Apply to eye.    ? ascorbic acid (VITAMIN C) 500 MG tablet Take 1 tablet by mouth daily.    ? Multiple Vitamin (MULTIVITAMIN ADULT PO) Take by mouth.    ? ?No facility-administered medications prior to visit.  ? ? ?PAST MEDICAL HISTORY: ?Past Medical History:  ?Diagnosis Date  ? Adult RDS [facility])   ? Anemia   ? Brain aneurysm   ? frontal lobe  ? CAD (coronary artery disease)   ? a. 02/2018: s/p DES to Proximal LAD with residual 20% RCA stenosis.  ? Chronic pain   ? Diabetes mellitus without complication ([place])   ? Headache   ? History of left bundle branch block (LBBB)   ? HTN (hypertension)   ? Hx of cardiovascular stress test 10/2016  ? intermediate risk study  ? Hypothyroidism   ? Neuromuscular disorder ([place])   ? Neuropathy   ? Obesity   ? OSA on CPAP 03/10/2015  ? Retinopathy   ? Sleep apnea   ? Varicose veins of both lower extremities   ? ? ?PAST SURGICAL HISTORY: ?Past Surgical History:  ?Procedure Laterality Date  ? BIOPSY  11/14/2016  ? Procedure: BIOPSY;  Surgeon: Danie Binder, MD;  [facility];  Service: Endoscopy;;  colon ?gastric ?duodenal  ? BREAST SURGERY Left 1994  ? Lumpectomy  ? COLONOSCOPY WITH PROPOFOL N/A 11/14/2016  ? Dr. Oneida Alar: Moderately redundant rectosigmoid colon. Random colon biopsies benign. Internal hemorrhoids. Surveillance colonoscopy in 5 years.  ? CORONARY STENT INTERVENTION N/A 03/22/2018  ? Procedure: CORONARY STENT INTERVENTION;  Surgeon: Burnell Blanks, MD;  Location: [place] [facility];  Service: Cardiovascular;  Laterality: N/A;  ? ESOPHAGOGASTRODUODENOSCOPY (EGD) WITH PROPOFOL N/A 11/14/2016  ? Dr. Oneida Alar: Esophagus normal. Moderate gastritis, few gastric polyps. Duodenal biopsies negative. Fundic gland polyp gastric polyp area no H pylori.  ? FOOT SURGERY    ? LEFT HEART CATH AND CORONARY ANGIOGRAPHY N/A 03/22/2018  ? Procedure: LEFT  HEART CATH AND CORONARY ANGIOGRAPHY;  Surgeon: Burnell Blanks, MD;  Location: [place] [facility];  Service: Cardiovascular;  Laterality: N/A;  ? LEG SURGERY Right   ? REPLACEMENT TOTAL KNEE Right   ? SPINAL CORD STIMULATOR IMPLANT    ? ? ?FAMILY HISTORY: ?Family History  ?Problem Relation Age of Onset  ? Cancer Mother 4  ?     colon  ? Cancer Father   ?  renal cell  ? Cancer Sister   ?     primary brain, and primary breast too  ? Cancer Sister   ?     breast   ? Cancer Brother   ?     bladder  ? Cancer Other   ?     multiple aunts with breast, cervical, or colon cancer  ? Sleep apnea Neg Hx   ? ? ?SOCIAL HISTORY: ?Social History  ? ?Socioeconomic History  ? Marital status: Widowed  ?  Spouse name: Not on file  ? Number of children: 0  ? Years of education: Not on file  ? Highest education level: Bachelor's degree (e.g., BA, AB, BS)  ?Occupational History  ? Occupation: Retired  ?Tobacco Use  ? Smoking status: Never  ? Smokeless tobacco: Never  ?Vaping Use  ? Vaping Use: Never used  ?Substance and Sexual Activity  ? Alcohol use: No  ?  Alcohol/week: 0.0 standard drinks  ? Drug use: No  ? Sexual activity: Not Currently  ?  Partners: Male  ?Other Topics Concern  ? Not on file  ?Social History Narrative  ? Drinks about 1 cup coffee day.  ? Cardiac rehab 3x week  ? One level home with significant other  ? Bachelors in [place]  ?   ? Right handed  ? No children  ? widow  ? ?Social Determinants of Health  ? ?Financial Resource Strain: Not on file  ?Food Insecurity: Not on file  ?Transportation Needs: Not on file  ?Physical Activity: Not on file  ?Stress: Not on file  ?Social Connections: Not on file  ?Intimate Partner Violence: Not on file  ? ? ? ? ?PHYSICAL EXAM ? ?Vitals:  ? 12/07/21 1045  ?BP: 123/63  ?Pulse: 82  ?Weight: 240 lb (108.9 kg)  ?Height: '5\' 8"'$  (1.727 m)  ? ?Body mass index is 36.49 kg/m?. ? ?Generalized: Well developed, in no acute distress  ?Chest: Lungs clear to auscultation  bilaterally ? ?Neurological examination  ?Mentation: Alert oriented to time, place, history taking. Follows all commands speech and language fluent ?Cranial nerve II-XII: Extraocular movements were full, visual field were full

## 2021-12-12 DIAGNOSIS — H60332 Swimmer's ear, left ear: Secondary | ICD-10-CM | POA: Diagnosis not present

## 2021-12-12 DIAGNOSIS — H9011 Conductive hearing loss, unilateral, right ear, with unrestricted hearing on the contralateral side: Secondary | ICD-10-CM | POA: Diagnosis not present

## 2021-12-12 DIAGNOSIS — H6121 Impacted cerumen, right ear: Secondary | ICD-10-CM | POA: Diagnosis not present

## 2021-12-13 DIAGNOSIS — M79674 Pain in right toe(s): Secondary | ICD-10-CM | POA: Diagnosis not present

## 2021-12-13 DIAGNOSIS — M79671 Pain in right foot: Secondary | ICD-10-CM | POA: Diagnosis not present

## 2021-12-13 DIAGNOSIS — E1140 Type 2 diabetes mellitus with diabetic neuropathy, unspecified: Secondary | ICD-10-CM | POA: Diagnosis not present

## 2021-12-13 DIAGNOSIS — L89892 Pressure ulcer of other site, stage 2: Secondary | ICD-10-CM | POA: Diagnosis not present

## 2021-12-14 ENCOUNTER — Ambulatory Visit (INDEPENDENT_AMBULATORY_CARE_PROVIDER_SITE_OTHER): Payer: Medicare Other

## 2021-12-14 DIAGNOSIS — J309 Allergic rhinitis, unspecified: Secondary | ICD-10-CM | POA: Diagnosis not present

## 2021-12-20 DIAGNOSIS — H6121 Impacted cerumen, right ear: Secondary | ICD-10-CM | POA: Diagnosis not present

## 2021-12-21 ENCOUNTER — Ambulatory Visit (INDEPENDENT_AMBULATORY_CARE_PROVIDER_SITE_OTHER): Payer: Medicare Other

## 2021-12-21 DIAGNOSIS — J309 Allergic rhinitis, unspecified: Secondary | ICD-10-CM

## 2021-12-28 ENCOUNTER — Ambulatory Visit (INDEPENDENT_AMBULATORY_CARE_PROVIDER_SITE_OTHER): Payer: Medicare Other

## 2021-12-28 DIAGNOSIS — J309 Allergic rhinitis, unspecified: Secondary | ICD-10-CM | POA: Diagnosis not present

## 2022-01-04 ENCOUNTER — Ambulatory Visit (INDEPENDENT_AMBULATORY_CARE_PROVIDER_SITE_OTHER): Payer: Medicare Other

## 2022-01-04 ENCOUNTER — Ambulatory Visit: Payer: Medicare Other | Admitting: Cardiology

## 2022-01-04 DIAGNOSIS — E039 Hypothyroidism, unspecified: Secondary | ICD-10-CM | POA: Diagnosis not present

## 2022-01-04 DIAGNOSIS — E109 Type 1 diabetes mellitus without complications: Secondary | ICD-10-CM | POA: Diagnosis not present

## 2022-01-04 DIAGNOSIS — M1460 Charcot's joint, unspecified site: Secondary | ICD-10-CM | POA: Diagnosis not present

## 2022-01-04 DIAGNOSIS — E118 Type 2 diabetes mellitus with unspecified complications: Secondary | ICD-10-CM | POA: Diagnosis not present

## 2022-01-04 DIAGNOSIS — E11319 Type 2 diabetes mellitus with unspecified diabetic retinopathy without macular edema: Secondary | ICD-10-CM | POA: Diagnosis not present

## 2022-01-04 DIAGNOSIS — J309 Allergic rhinitis, unspecified: Secondary | ICD-10-CM

## 2022-01-04 DIAGNOSIS — Z794 Long term (current) use of insulin: Secondary | ICD-10-CM | POA: Diagnosis not present

## 2022-01-04 DIAGNOSIS — E669 Obesity, unspecified: Secondary | ICD-10-CM | POA: Diagnosis not present

## 2022-01-04 DIAGNOSIS — E1140 Type 2 diabetes mellitus with diabetic neuropathy, unspecified: Secondary | ICD-10-CM | POA: Diagnosis not present

## 2022-01-04 DIAGNOSIS — I10 Essential (primary) hypertension: Secondary | ICD-10-CM | POA: Diagnosis not present

## 2022-01-04 DIAGNOSIS — I2510 Atherosclerotic heart disease of native coronary artery without angina pectoris: Secondary | ICD-10-CM | POA: Diagnosis not present

## 2022-01-04 DIAGNOSIS — E785 Hyperlipidemia, unspecified: Secondary | ICD-10-CM | POA: Diagnosis not present

## 2022-01-04 DIAGNOSIS — Z9641 Presence of insulin pump (external) (internal): Secondary | ICD-10-CM | POA: Diagnosis not present

## 2022-01-13 ENCOUNTER — Ambulatory Visit (INDEPENDENT_AMBULATORY_CARE_PROVIDER_SITE_OTHER): Payer: Medicare Other

## 2022-01-13 DIAGNOSIS — J309 Allergic rhinitis, unspecified: Secondary | ICD-10-CM

## 2022-01-18 ENCOUNTER — Other Ambulatory Visit (HOSPITAL_COMMUNITY)
Admission: RE | Admit: 2022-01-18 | Discharge: 2022-01-18 | Disposition: A | Discharge status: Home / Self Care | Payer: Medicare Other | Source: Ambulatory Visit | Attending: Endocrinology | Admitting: Endocrinology

## 2022-01-18 ENCOUNTER — Other Ambulatory Visit (HOSPITAL_COMMUNITY)
Admission: RE | Admit: 2022-01-18 | Discharge: 2022-01-18 | Disposition: A | Discharge status: Home / Self Care | Payer: Medicare Other | Source: Ambulatory Visit | Attending: Nephrology | Admitting: Nephrology

## 2022-01-18 ENCOUNTER — Ambulatory Visit (INDEPENDENT_AMBULATORY_CARE_PROVIDER_SITE_OTHER): Payer: Medicare Other

## 2022-01-18 DIAGNOSIS — R809 Proteinuria, unspecified: Secondary | ICD-10-CM | POA: Diagnosis not present

## 2022-01-18 DIAGNOSIS — J309 Allergic rhinitis, unspecified: Secondary | ICD-10-CM

## 2022-01-18 DIAGNOSIS — E1129 Type 2 diabetes mellitus with other diabetic kidney complication: Secondary | ICD-10-CM | POA: Insufficient documentation

## 2022-01-18 DIAGNOSIS — N189 Chronic kidney disease, unspecified: Secondary | ICD-10-CM | POA: Insufficient documentation

## 2022-01-18 DIAGNOSIS — E039 Hypothyroidism, unspecified: Secondary | ICD-10-CM | POA: Insufficient documentation

## 2022-01-18 DIAGNOSIS — I5032 Chronic diastolic (congestive) heart failure: Secondary | ICD-10-CM | POA: Insufficient documentation

## 2022-01-18 DIAGNOSIS — E211 Secondary hyperparathyroidism, not elsewhere classified: Secondary | ICD-10-CM | POA: Insufficient documentation

## 2022-01-18 DIAGNOSIS — I129 Hypertensive chronic kidney disease with stage 1 through stage 4 chronic kidney disease, or unspecified chronic kidney disease: Secondary | ICD-10-CM | POA: Diagnosis not present

## 2022-01-18 DIAGNOSIS — E1122 Type 2 diabetes mellitus with diabetic chronic kidney disease: Secondary | ICD-10-CM | POA: Insufficient documentation

## 2022-01-18 LAB — CBC
HCT: 35.6 % — ABNORMAL LOW (ref 36.0–46.0)
Hemoglobin: 11.2 g/dL — ABNORMAL LOW (ref 12.0–15.0)
MCH: 29.7 pg (ref 26.0–34.0)
MCHC: 31.5 g/dL (ref 30.0–36.0)
MCV: 94.4 fL (ref 80.0–100.0)
Platelets: 211 10*3/uL (ref 150–400)
RBC: 3.77 MIL/uL — ABNORMAL LOW (ref 3.87–5.11)
RDW: 13.6 % (ref 11.5–15.5)
WBC: 4.9 10*3/uL (ref 4.0–10.5)
nRBC: 0 % (ref 0.0–0.2)

## 2022-01-18 LAB — RENAL FUNCTION PANEL
Albumin: 3.9 g/dL (ref 3.5–5.0)
Anion gap: 8 (ref 5–15)
BUN: 72 mg/dL — ABNORMAL HIGH (ref 8–23)
CO2: 29 mmol/L (ref 22–32)
Calcium: 9.1 mg/dL (ref 8.9–10.3)
Chloride: 102 mmol/L (ref 98–111)
Creatinine, Ser: 1.7 mg/dL — ABNORMAL HIGH (ref 0.44–1.00)
GFR, Estimated: 33 mL/min — ABNORMAL LOW (ref >60)
Glucose, Bld: 137 mg/dL — ABNORMAL HIGH (ref 70–99)
Phosphorus: 3.8 mg/dL (ref 2.5–4.6)
Potassium: 4.7 mmol/L (ref 3.5–5.1)
Sodium: 139 mmol/L (ref 135–145)

## 2022-01-18 LAB — PROTEIN / CREATININE RATIO, URINE
Creatinine, Urine: 21.2 mg/dL
Protein Creatinine Ratio: 0.38 mg/mg{Cre} — ABNORMAL HIGH (ref 0.00–0.15)
Total Protein, Urine: 8 mg/dL

## 2022-01-18 LAB — TSH: TSH: 1.295 u[IU]/mL (ref 0.350–4.500)

## 2022-01-19 ENCOUNTER — Other Ambulatory Visit: Payer: Self-pay | Admitting: Internal Medicine

## 2022-01-19 DIAGNOSIS — Z1231 Encounter for screening mammogram for malignant neoplasm of breast: Secondary | ICD-10-CM

## 2022-01-24 ENCOUNTER — Encounter: Payer: Self-pay | Admitting: *Deleted

## 2022-01-25 ENCOUNTER — Ambulatory Visit (INDEPENDENT_AMBULATORY_CARE_PROVIDER_SITE_OTHER): Payer: Medicare Other

## 2022-01-25 DIAGNOSIS — J309 Allergic rhinitis, unspecified: Secondary | ICD-10-CM

## 2022-01-25 DIAGNOSIS — E109 Type 1 diabetes mellitus without complications: Secondary | ICD-10-CM | POA: Diagnosis not present

## 2022-01-25 DIAGNOSIS — Z9641 Presence of insulin pump (external) (internal): Secondary | ICD-10-CM | POA: Diagnosis not present

## 2022-01-26 ENCOUNTER — Encounter: Payer: Self-pay | Admitting: Cardiology

## 2022-01-26 ENCOUNTER — Ambulatory Visit (INDEPENDENT_AMBULATORY_CARE_PROVIDER_SITE_OTHER): Payer: Medicare Other | Admitting: Cardiology

## 2022-01-26 VITALS — BP 106/74 | HR 80 | Ht 68.0 in | Wt 255.0 lb

## 2022-01-26 DIAGNOSIS — I2510 Atherosclerotic heart disease of native coronary artery without angina pectoris: Secondary | ICD-10-CM

## 2022-01-26 DIAGNOSIS — E782 Mixed hyperlipidemia: Secondary | ICD-10-CM | POA: Diagnosis not present

## 2022-01-26 DIAGNOSIS — R079 Chest pain, unspecified: Secondary | ICD-10-CM | POA: Diagnosis not present

## 2022-01-26 DIAGNOSIS — I10 Essential (primary) hypertension: Secondary | ICD-10-CM

## 2022-01-26 MED ORDER — NITROGLYCERIN 0.4 MG SL SUBL: 0.4000 mg | SUBLINGUAL_TABLET | SUBLINGUAL | 3 refills | Status: DC | PRN

## 2022-01-26 NOTE — Addendum Note (Signed)
Addended by: Christella Scheuermann C on: 01/26/2022 11:01 AM   Modules accepted: Orders

## 2022-01-26 NOTE — Patient Instructions (Signed)
Medication Instructions:  Your physician recommends that you continue on your current medications as directed. Please refer to the Current Medication list given to you today.   Labwork: None  Testing/Procedures: Your physician has requested that you have a lexiscan myoview. For further information please visit [email or web address]. Please follow instruction sheet, as given.   Follow-Up: Follow up with Dr. Branch in 6 months.   Any Other Special Instructions Will Be Listed Below (If Applicable).     If you need a refill on your cardiac medications before your next appointment, please call your pharmacy.  

## 2022-01-26 NOTE — Progress Notes (Signed)
Clinical Summary Jodi Ray is a 61 y.o.female seen today for follow up of the following medical problems.    1. CAD - drug-eluting stent placement to the proximal LAD on 03/22/2018. - from notes has chronic nonspecific chest pains  - LV systolic function is normal with an EF of 60 to 65% by echocardiogram in December 2019   - does cycling 3 days a week x 1 hr 15 min high impact up to 11 miles, weight training 4 days a week.  - occasonal chest pressure at very high levels of exertion.  - compliant with meds. Had some issues with low bp's previously, from 03/13/2019 note coreg was changed to just prior to exercise       - she stopped cardiac rehab - goes to [facility] 6 days a week. Does cycling classes, does 100 miles per week - she is off aspirin, she is not sure when and why stopped in the past. Denies any issues with it  - last few months reporst significant decline in her exercise capacity - midchest, squeezing like pain or heaviness heart pounding. Can be 8/10 in severity. +SOB.Lasts a few minutes. Can occur at rest or with exertion. Not positional. Occurs 2-3 times per week. Symptoms started about 2 months ago. Started only when she was exercising, now occurring at rest. Compliant with meds - overall decrease in stamina   2. HTN - compliant with meds Had some issues with low bp's previously, from 03/13/2019 note coreg was changed to just prior to exercise   - compliant with meds   3. Hyperlipidemia - upcoming labs with pcp - Jan 2022 TC 167 TG 211 LDL 86 01/2021 TC 157 TG 34 HDL 88 LDL 61   4. OSA - using cpap machine, followed by Dr Brett Fairy.    5. DM1 - followed by endocrinology   6. CKD 3 - followed by Dr Theador Hawthorne - had renal biopsy 06/28/2021. - from renal notes historically sensitive to diuretics - lasix managed by nephrology, takes 2 tables in AM and 1 tablet in PM. Swelling is much improved.      7. Chronic LBBB   8. Chronic diastolic HF  Echo LVEF 97-02%,  grade I dd - diuretics per renal, from there note has historically been sensitive to diuretic dosing regarding her renal function Past Medical History:  Diagnosis Date   Adult RDS ([place])    Anemia    Brain aneurysm    frontal lobe   CAD (coronary artery disease)    a. 02/2018: s/p DES to Proximal LAD with residual 20% RCA stenosis.   Chronic pain    Diabetes mellitus without complication ([facility])    Headache    History of left bundle [place] block (LBBB)    HTN (hypertension)    Hx of cardiovascular stress test 10/2016   intermediate risk study   Hypothyroidism    Neuromuscular disorder ([facility])    Neuropathy    Obesity    OSA on CPAP 03/10/2015   Retinopathy    Sleep apnea    Varicose veins of both lower extremities      Allergies  Allergen Reactions   Other Nausea And Vomiting and Other (See Comments)    Opiates Alters mental status diarrhea   Fentanyl Nausea And Vomiting and Other (See Comments)    Nausea, vomiting, loss of consciousness, requiring reversal     Current Outpatient Medications  Medication Sig Dispense Refill   ascorbic acid (VITAMIN C) 500 MG  tablet Take 1 tablet by mouth daily. (Patient not taking: Reported on 01/24/2022)     aspirin EC 81 MG tablet Take 1 tablet (81 mg total) by mouth daily. Swallow whole. (Patient not taking: Reported on 01/24/2022) 90 tablet 3   atorvastatin (LIPITOR) 80 MG tablet TAKE ONE TABLET BY MOUTH DAILY AT 6 PM 90 tablet 3   carvedilol (COREG) 3.125 MG tablet TAKE ONE TABLET BY MOUTH 1 HOUR BEFORE CARDIAC REHAB (Patient taking differently: Take 3.125 mg by mouth 2 (two) times daily with a meal.) 90 tablet 2   cetirizine (ZYRTEC) 10 MG tablet Take 10 mg by mouth daily.     Continuous Blood Gluc Sensor (DEXCOM G6 SENSOR) MISC 1 Device by Other route as directed.     EPINEPHrine 0.3 mg/0.3 mL IJ SOAJ injection Inject 0.3 mg into the muscle as needed for anaphylaxis. (Patient not taking: Reported on 01/24/2022) 2 each 1   fish oil-omega-3  fatty acids 1000 MG capsule Take by mouth. (Patient not taking: Reported on 01/24/2022)     furosemide (LASIX) 20 MG tablet Take 40 mg by mouth in the morning.     glucose blood ([employer] BLOOD GLUCOSE TEST) test strip 1 strip (Patient not taking: Reported on 01/24/2022)     Insulin Human (INSULIN PUMP) SOLN Inject into the skin continuous.      JARDIANCE 10 MG TABS tablet Take 10 mg by mouth every morning. Take a 1/2 tablet daily     levothyroxine (SYNTHROID) 112 MCG tablet Take 112 mcg by mouth daily before breakfast.     lisinopril (ZESTRIL) 10 MG tablet Take 10 mg by mouth daily.     loratadine (CLARITIN) 10 MG tablet Take 10 mg by mouth daily.     metFORMIN (GLUCOPHAGE) 500 MG tablet Take 1 tablet (500 mg total) by mouth 2 (two) times daily with a meal.     Multiple Vitamin (MULTIVITAMIN ADULT PO) Take by mouth.     nitroGLYCERIN (NITROSTAT) 0.4 MG SL tablet Place 1 tablet (0.4 mg total) under the tongue every 5 (five) minutes as needed for chest pain. (Patient not taking: Reported on 01/24/2022) 25 tablet 3   NOVOLOG 100 UNIT/ML injection Inject into the skin continuous. In insulin pump  0   OXYGEN Inhale into the lungs. As needed (Patient not taking: Reported on 01/24/2022)     pantoprazole (PROTONIX) 40 MG tablet Take 40 mg by mouth daily.     VITAMIN A OP Apply to eye. (Patient not taking: Reported on 01/24/2022)     No current facility-administered medications for this visit.     Past Surgical History:  Procedure Laterality Date   BIOPSY  11/14/2016   Procedure: BIOPSY;  Surgeon: Danie Binder, MD;  [facility];  Service: Endoscopy;;  colon gastric duodenal   BREAST SURGERY Left 1994   Lumpectomy   COLONOSCOPY WITH PROPOFOL N/A 11/14/2016   Dr. Oneida Alar: Moderately redundant rectosigmoid colon. Random colon biopsies benign. Internal hemorrhoids. Surveillance colonoscopy in 5 years.   CORONARY STENT INTERVENTION N/A 03/22/2018   Procedure: CORONARY STENT INTERVENTION;   Surgeon: Burnell Blanks, MD;  Location: [place] [facility];  Service: Cardiovascular;  Laterality: N/A;   ESOPHAGOGASTRODUODENOSCOPY (EGD) WITH PROPOFOL N/A 11/14/2016   Dr. Oneida Alar: Esophagus normal. Moderate gastritis, few gastric polyps. Duodenal biopsies negative. Fundic gland polyp gastric polyp area no H pylori.   FOOT SURGERY     LEFT HEART CATH AND CORONARY ANGIOGRAPHY N/A 03/22/2018   Procedure: LEFT HEART  CATH AND CORONARY ANGIOGRAPHY;  Surgeon: Burnell Blanks, MD;  Location: [place] [facility];  Service: Cardiovascular;  Laterality: N/A;   LEG SURGERY Right    REPLACEMENT TOTAL KNEE Right    SPINAL CORD STIMULATOR IMPLANT       Allergies  Allergen Reactions   Other Nausea And Vomiting and Other (See Comments)    Opiates Alters mental status diarrhea   Fentanyl Nausea And Vomiting and Other (See Comments)    Nausea, vomiting, loss of consciousness, requiring reversal      Family History  Problem Relation Age of Onset   Cancer Mother 66       colon   Cancer Father        renal cell   Cancer Sister        primary brain, and primary breast too   Cancer Sister        breast    Cancer Brother        bladder   Cancer Other        multiple aunts with breast, cervical, or colon cancer   Sleep apnea Neg Hx      Social History Ms. Reeb reports that she has never smoked. She has never used smokeless tobacco. Ms. Carmicheal reports no history of alcohol use.   Review of Systems CONSTITUTIONAL: No weight loss, fever, chills, weakness or fatigue.  HEENT: Eyes: No visual loss, blurred vision, double vision or yellow sclerae.No hearing loss, sneezing, congestion, runny nose or sore throat.  SKIN: No rash or itching.  CARDIOVASCULAR: per hpi RESPIRATORY: per hpi GASTROINTESTINAL: No anorexia, nausea, vomiting or diarrhea. No abdominal pain or blood.  GENITOURINARY: No burning on urination, no polyuria NEUROLOGICAL: No headache, dizziness, syncope,  paralysis, ataxia, numbness or tingling in the extremities. No change in bowel or bladder control.  MUSCULOSKELETAL: No muscle, back pain, joint pain or stiffness.  LYMPHATICS: No enlarged nodes. No history of splenectomy.  PSYCHIATRIC: No history of depression or anxiety.  ENDOCRINOLOGIC: No reports of sweating, cold or heat intolerance. No polyuria or polydipsia.  Marland Kitchen   Physical Examination Today's Vitals   01/26/22 0853  BP: 106/74  Pulse: 80  SpO2: 99%  Weight: 255 lb (115.7 kg)  Height: '5\' 8"'$  (1.727 m)   Body mass index is 38.77 kg/m.  Gen: resting comfortably, no acute distress HEENT: no scleral icterus, pupils equal round and reactive, no palptable cervical adenopathy,  CV: RRR, no m/r/g no jvd Resp: Clear to auscultation bilaterally GI: abdomen is soft, non-tender, non-distended, normal bowel sounds, no hepatosplenomegaly MSK: extremities are warm, no edema.  Skin: warm, no rash Neuro:  no focal deficits Psych: appropriate affect   Diagnostic Studies  Echocardiogram on 07/17/2018 demonstrated normal left ventricular systolic function and regional wall motion, LVEF 60 to 65%, indeterminate grade diastolic dysfunction, moderate LVH, and mild left atrial dilatation.     Cardiac catheterization 03/22/18:   Prox RCA lesion is 20% stenosed. Prox LAD lesion is 80% stenosed. A drug-eluting stent was successfully placed using a STENT SIERRA 3.00 X 18 MM. Post intervention, there is a 0% residual stenosis. The left ventricular systolic function is normal. LV end diastolic pressure is normal. The left ventricular ejection fraction is 55-65% by visual estimate. There is no mitral valve regurgitation.   1. Severe stenosis proximal LAD 2. Successful PTCA/DES x 1 proximal LAD 3. Mild non-obstructive disease in the RCA\ 4. Normal LV systolic function   12/7670 echo 1. Left ventricular ejection fraction, by estimation, is  60 to 65%. The  left ventricle has normal function.  The left ventricle has no regional  wall motion abnormalities. Left ventricular diastolic parameters are  consistent with Grade I diastolic  dysfunction (impaired relaxation).   2. Right ventricular systolic function is normal. The right ventricular  size is normal. Tricuspid regurgitation signal is inadequate for assessing  PA pressure.   3. The mitral valve is degenerative. No evidence of mitral valve  regurgitation. No evidence of mitral stenosis.   4. The aortic valve is tricuspid. Aortic valve regurgitation is not  visualized. No aortic stenosis is present.   5. The inferior vena cava is dilated in size with >50% respiratory  variability, suggesting right atrial pressure of 8 mmHg.    10/2020 echo IMPRESSIONS     1. Left ventricular ejection fraction, by estimation, is 60 to 65%. The  left ventricle has normal function. The left ventricle has no regional  wall motion abnormalities. Left ventricular diastolic parameters are  consistent with Grade I diastolic  dysfunction (impaired relaxation).   2. Right ventricular systolic function is normal. The right ventricular  size is normal.   3. Left atrial size was severely dilated.   4. Right atrial size was mildly dilated.   5. The mitral valve is normal in structure. No evidence of mitral valve  regurgitation. No evidence of mitral stenosis.   6. The aortic valve is tricuspid. There is mild calcification of the  aortic valve. There is mild thickening of the aortic valve. Aortic valve  regurgitation is not visualized. No aortic stenosis is present.   7. The inferior vena cava is normal in size with greater than 50%  respiratory variability, suggesting right atrial pressure of 3 mmHg.      Assessment and Plan  1.  Coronary artery disease - some recent chest tightness, though can also be associated with palpitations - obtain 2 day lexiscan, if benign would plan 14 day zio patch   2.  Hypertension -at goal, continue current  meds  3.  Hyperlipidemia - at goal, we will continue statin        Arnoldo Lenis, M.D.

## 2022-01-26 NOTE — Progress Notes (Signed)
Medical history meets ASA 3 criteria. Needs OV. Please arrange VV with me on a Friday if she has the capability.

## 2022-01-26 NOTE — Progress Notes (Signed)
Fowarding to [place] to schedule. And FYI to [place]

## 2022-02-01 ENCOUNTER — Encounter: Payer: Self-pay | Admitting: Cardiology

## 2022-02-01 ENCOUNTER — Ambulatory Visit (INDEPENDENT_AMBULATORY_CARE_PROVIDER_SITE_OTHER): Payer: Medicare Other

## 2022-02-01 DIAGNOSIS — I5032 Chronic diastolic (congestive) heart failure: Secondary | ICD-10-CM | POA: Diagnosis not present

## 2022-02-01 DIAGNOSIS — N189 Chronic kidney disease, unspecified: Secondary | ICD-10-CM | POA: Diagnosis not present

## 2022-02-01 DIAGNOSIS — E1122 Type 2 diabetes mellitus with diabetic chronic kidney disease: Secondary | ICD-10-CM | POA: Diagnosis not present

## 2022-02-01 DIAGNOSIS — I129 Hypertensive chronic kidney disease with stage 1 through stage 4 chronic kidney disease, or unspecified chronic kidney disease: Secondary | ICD-10-CM | POA: Diagnosis not present

## 2022-02-01 DIAGNOSIS — E211 Secondary hyperparathyroidism, not elsewhere classified: Secondary | ICD-10-CM | POA: Diagnosis not present

## 2022-02-01 DIAGNOSIS — Z6838 Body mass index (BMI) 38.0-38.9, adult: Secondary | ICD-10-CM | POA: Diagnosis not present

## 2022-02-01 DIAGNOSIS — J309 Allergic rhinitis, unspecified: Secondary | ICD-10-CM

## 2022-02-01 DIAGNOSIS — E1129 Type 2 diabetes mellitus with other diabetic kidney complication: Secondary | ICD-10-CM | POA: Diagnosis not present

## 2022-02-01 DIAGNOSIS — R809 Proteinuria, unspecified: Secondary | ICD-10-CM | POA: Diagnosis not present

## 2022-02-01 DIAGNOSIS — D638 Anemia in other chronic diseases classified elsewhere: Secondary | ICD-10-CM | POA: Diagnosis not present

## 2022-02-02 NOTE — Telephone Encounter (Signed)
That medication would be ok from a cardiac standpoint  Zandra Abts MD

## 2022-02-03 ENCOUNTER — Ambulatory Visit (HOSPITAL_BASED_OUTPATIENT_CLINIC_OR_DEPARTMENT_OTHER)
Admission: RE | Admit: 2022-02-03 | Discharge: 2022-02-03 | Disposition: A | Discharge status: Home / Self Care | Payer: Medicare Other | Source: Ambulatory Visit | Attending: Cardiology | Admitting: Cardiology

## 2022-02-03 ENCOUNTER — Encounter (HOSPITAL_COMMUNITY): Payer: Self-pay

## 2022-02-03 ENCOUNTER — Ambulatory Visit (HOSPITAL_COMMUNITY)
Admission: RE | Admit: 2022-02-03 | Discharge: 2022-02-03 | Disposition: A | Discharge status: Home / Self Care | Payer: Medicare Other | Source: Ambulatory Visit | Attending: Cardiology | Admitting: Cardiology

## 2022-02-03 DIAGNOSIS — R079 Chest pain, unspecified: Secondary | ICD-10-CM | POA: Insufficient documentation

## 2022-02-03 HISTORY — DX: Disorder of kidney and ureter, unspecified: N28.9

## 2022-02-03 LAB — NM MYOCAR MULTI W/SPECT W/WALL MOTION / EF
Base ST Depression (mm): 0 mm
LV dias vol: 77 mL (ref 46–106)
LV sys vol: 20 mL
Nuc Stress EF: 75 %
Peak HR: 97 {beats}/min
RATE: 0.6
Rest HR: 84 {beats}/min
Rest Nuclear Isotope Dose: 10.8 mCi
SDS: 1
SRS: 6
SSS: 7
ST Depression (mm): 0 mm
Stress Nuclear Isotope Dose: 33 mCi
TID: 1.34

## 2022-02-03 MED ORDER — REGADENOSON 0.4 MG/5ML IV SOLN
INTRAVENOUS | Status: AC
  Administered 2022-02-03: 0.4 mg via INTRAVENOUS
  Filled 2022-02-03: qty 5

## 2022-02-03 MED ORDER — SODIUM CHLORIDE FLUSH 0.9 % IV SOLN
INTRAVENOUS | Status: AC
  Administered 2022-02-03: 10 mL via INTRAVENOUS
  Filled 2022-02-03: qty 10

## 2022-02-03 MED ORDER — TECHNETIUM TC 99M TETROFOSMIN IV KIT
10.0000 | PACK | Freq: Once | INTRAVENOUS | Status: AC | PRN
  Administered 2022-02-03: 10.8 via INTRAVENOUS

## 2022-02-03 MED ORDER — TECHNETIUM TC 99M TETROFOSMIN IV KIT
30.0000 | PACK | Freq: Once | INTRAVENOUS | Status: AC | PRN
Start: 2022-02-03 — End: 2022-02-03
  Administered 2022-02-03: 33 via INTRAVENOUS

## 2022-02-06 ENCOUNTER — Ambulatory Visit: Payer: Medicare Other

## 2022-02-06 ENCOUNTER — Ambulatory Visit (INDEPENDENT_AMBULATORY_CARE_PROVIDER_SITE_OTHER): Payer: Medicare Other

## 2022-02-06 ENCOUNTER — Other Ambulatory Visit: Payer: Self-pay | Admitting: Cardiology

## 2022-02-06 ENCOUNTER — Telehealth: Payer: Self-pay

## 2022-02-06 DIAGNOSIS — R002 Palpitations: Secondary | ICD-10-CM

## 2022-02-06 NOTE — Telephone Encounter (Signed)
Patient notified and verbalized understanding. Patient had no questions or concerns at this time. Will mail patient Zio for 14 days

## 2022-02-06 NOTE — Telephone Encounter (Signed)
-----   Message from Arnoldo Lenis, MD sent at 02/05/2022 12:23 PM EDT ----- No evidence of blockages on stress test. Can she get a 14 day zio patch for palpitations to see if any abnormal heart rhythms may be contributing to her symptoms  Zandra Abts MD

## 2022-02-08 ENCOUNTER — Ambulatory Visit (INDEPENDENT_AMBULATORY_CARE_PROVIDER_SITE_OTHER): Payer: Medicare Other

## 2022-02-08 DIAGNOSIS — J309 Allergic rhinitis, unspecified: Secondary | ICD-10-CM

## 2022-02-09 DIAGNOSIS — R002 Palpitations: Secondary | ICD-10-CM | POA: Diagnosis not present

## 2022-02-14 DIAGNOSIS — E1151 Type 2 diabetes mellitus with diabetic peripheral angiopathy without gangrene: Secondary | ICD-10-CM | POA: Diagnosis not present

## 2022-02-14 DIAGNOSIS — E1140 Type 2 diabetes mellitus with diabetic neuropathy, unspecified: Secondary | ICD-10-CM | POA: Diagnosis not present

## 2022-02-15 ENCOUNTER — Other Ambulatory Visit (HOSPITAL_COMMUNITY)
Admission: RE | Admit: 2022-02-15 | Discharge: 2022-02-15 | Disposition: A | Discharge status: Home / Self Care | Payer: Medicare Other | Source: Ambulatory Visit | Attending: Nephrology | Admitting: Nephrology

## 2022-02-15 ENCOUNTER — Ambulatory Visit (INDEPENDENT_AMBULATORY_CARE_PROVIDER_SITE_OTHER): Payer: Medicare Other

## 2022-02-15 DIAGNOSIS — N189 Chronic kidney disease, unspecified: Secondary | ICD-10-CM | POA: Diagnosis not present

## 2022-02-15 DIAGNOSIS — J309 Allergic rhinitis, unspecified: Secondary | ICD-10-CM

## 2022-02-15 LAB — POTASSIUM: Potassium: 5.7 mmol/L — ABNORMAL HIGH (ref 3.5–5.1)

## 2022-02-20 ENCOUNTER — Ambulatory Visit: Payer: Medicare Other | Admitting: Adult Health

## 2022-02-20 NOTE — Progress Notes (Unsigned)
GI Office Note    Referring Provider: Monico Blitz, MD Primary Care Physician:  Monico Blitz, MD Primary GI: Dr. Abbey Chatters (previously Dr. Oneida Alar)  Date:  02/21/2022  ID:  Jodi Ray, DOB 1961/06/23, MRN [id number]   Chief Complaint   Chief Complaint  Patient presents with   Colonoscopy    History of Present Illness  Jodi Ray is a 61 y.o. female with a history of anemia, brain aneurysm, CAD s/p stent in 2019, diabetes, chronic pain, HTN, hypothyroidism, OSA on CPAP, and CKD stage IIIb presenting today to schedule TCS.  Patient last seen in the office in August 2018 for severe right upper quadrant pain.  She had prior work-up with abdominal ultrasound showing mildly distended gallbladder but otherwise normal, hepatic steatosis.  Also had HIDA scan which was unremarkable.  She was evaluated by general surgeon who felt as though her pain was not related to her gallbladder.  Patient also noted loose stools with abdominal cramping that occurs occasionally.  Prior work-up with CT A/P with contrast in March 2018 with fairly large amount of stool in the colon and minimal ventral hernia containing fat only.  She was briefly taking Bentyl due to intermittent loose stools and was advised to discontinue this.  Patient reported normal gastric emptying study at [facility].  Patient's pain was rated at 7-8 out of 10 and pain typically last for 5 to 10 minutes at a time.  She denied heartburn as she felt like she did not know what it was however did admit to feeling like something stuck in her chest from time to time.  She is advised to continue omeprazole 40 mg twice daily and start Levsin as needed for abdominal pain.  Last EGD and colonoscopy performed April 2018.  EGD with moderate gastritis and benign gastric polyp.  Also with normal duodenal biopsies.  Colonoscopy colon biopsies which were negative for microscopic colitis.  She had evidence of redundant left colon, also presence of  internal hemorrhoids.  Given family history of colon cancer she was advised colonoscopy in 5 years.  She was also advised high-fiber diet and lactose-free diet.  Patient has been following with allergy and asthma center [place] and has been getting allergen immunotherapy for chronic seasonal perennial allergic rhinitis.  She also follows with endocrinology for diabetes  Today:  Bowel habits - Has changes related to her diet. She states she has more diarrhea in the summer time related to increased fruit and vegetables consumption. In the winter she has more grains and beans. This pattern is not new for her.   Always has abdominal pain in the upper abdomen related to gastritis. Has been maintained on pantoprazole 40 mg daily. Rare tums use.  Denies melena or hematochezia. Denies dysphagia, unintentional weight loss. Goes to the gym regularly and has been gaining muscle. Goal is 200lb.   Medications - Insulin pump, tracks carbs and has experience with adjusting her ump as needed when she does not eat etc. Takes jardiance in the am and metformin BID  Cardiology following for ATTR. Was having shortness of breath and chest pain recently. Did chemical test and was negative. Turns in the device this Thursday. Had a stent placed 6 years ago. Also only taking baby aspirin. Has been on lasix for lower extremity edema and has frequent trips to the bathroom for 3 hours.   Past Medical History:  Diagnosis Date   Adult RDS ([place])    Anemia    Brain  aneurysm    frontal lobe   CAD (coronary artery disease)    a. 02/2018: s/p DES to Proximal LAD with residual 20% RCA stenosis.   Chronic pain    Diabetes mellitus without complication (HCC)    Headache    History of left bundle branch block (LBBB)    HTN (hypertension)    Hx of cardiovascular stress test 10/2016   intermediate risk study   Hypothyroidism    Neuromuscular disorder ([place])    Neuropathy    Obesity    OSA on CPAP 03/10/2015   Renal  insufficiency    Retinopathy    Sleep apnea    Varicose veins of both lower extremities     Past Surgical History:  Procedure Laterality Date   BIOPSY  11/14/2016   Procedure: BIOPSY;  Surgeon: Danie Binder, MD;  [facility];  Service: Endoscopy;;  colon gastric duodenal   BREAST SURGERY Left 1994   Lumpectomy   COLONOSCOPY WITH PROPOFOL N/A 11/14/2016   Dr. Oneida Alar: Moderately redundant rectosigmoid colon. Random colon biopsies benign. Internal hemorrhoids. Surveillance colonoscopy in 5 years.   CORONARY STENT INTERVENTION N/A 03/22/2018   Procedure: CORONARY STENT INTERVENTION;  Surgeon: Burnell Blanks, MD;  Location: [place] [facility];  Service: Cardiovascular;  Laterality: N/A;   ESOPHAGOGASTRODUODENOSCOPY (EGD) WITH PROPOFOL N/A 11/14/2016   Dr. Oneida Alar: Esophagus normal. Moderate gastritis, few gastric polyps. Duodenal biopsies negative. Fundic gland polyp gastric polyp area no H pylori.   FOOT SURGERY     LEFT HEART CATH AND CORONARY ANGIOGRAPHY N/A 03/22/2018   Procedure: LEFT HEART CATH AND CORONARY ANGIOGRAPHY;  Surgeon: Burnell Blanks, MD;  Location: [place] CV LAB;  Service: Cardiovascular;  Laterality: N/A;   LEG SURGERY Right    REPLACEMENT TOTAL KNEE Right    SPINAL CORD STIMULATOR IMPLANT      Current Outpatient Medications  Medication Sig Dispense Refill   aspirin EC 81 MG tablet Take 1 tablet (81 mg total) by mouth daily. Swallow whole. 90 tablet 3   atorvastatin (LIPITOR) 80 MG tablet TAKE ONE TABLET BY MOUTH DAILY AT 6 PM 90 tablet 3   calcitRIOL (ROCALTROL) 0.25 MCG capsule Take 0.25 mcg by mouth 3 (three) times a week.     carvedilol (COREG) 3.125 MG tablet TAKE ONE TABLET BY MOUTH 1 HOUR BEFORE CARDIAC REHAB (Patient taking differently: Take 3.125 mg by mouth 2 (two) times daily with a meal.) 90 tablet 2   Continuous Blood Gluc Sensor (DEXCOM G6 SENSOR) MISC 1 Device by Other route as directed.     EPINEPHrine 0.3 mg/0.3 mL IJ  SOAJ injection Inject 0.3 mg into the muscle as needed for anaphylaxis. 2 each 1   fish oil-omega-3 fatty acids 1000 MG capsule Take by mouth.     furosemide (LASIX) 40 MG tablet Take 40 mg by mouth daily. 40 MG AM, 20 MG PM     glucose blood ([employer] BLOOD GLUCOSE TEST) test strip      Insulin Human (INSULIN PUMP) SOLN Inject into the skin continuous.      JARDIANCE 10 MG TABS tablet Take 10 mg by mouth every morning. Take a 1/2 tablet daily     KERENDIA 10 MG TABS Take 1 tablet by mouth daily.     levothyroxine (SYNTHROID) 112 MCG tablet Take 112 mcg by mouth daily before breakfast.     lisinopril (ZESTRIL) 10 MG tablet Take 10 mg by mouth daily.     metFORMIN (GLUCOPHAGE) 500  MG tablet Take 1 tablet (500 mg total) by mouth 2 (two) times daily with a meal.     Multiple Vitamin (MULTIVITAMIN ADULT PO) Take by mouth.     nitroGLYCERIN (NITROSTAT) 0.4 MG SL tablet Place 1 tablet (0.4 mg total) under the tongue every 5 (five) minutes as needed for chest pain. 25 tablet 3   NOVOLOG 100 UNIT/ML injection Inject into the skin continuous. In insulin pump  0   pantoprazole (PROTONIX) 40 MG tablet Take 40 mg by mouth daily.     VITAMIN A OP Apply to eye.     ascorbic acid (VITAMIN C) 500 MG tablet Take 1 tablet by mouth daily. (Patient not taking: Reported on 01/24/2022)     cetirizine (ZYRTEC) 10 MG tablet Take 10 mg by mouth daily. (Patient not taking: Reported on 02/21/2022)     loratadine (CLARITIN) 10 MG tablet Take 10 mg by mouth daily. (Patient not taking: Reported on 02/21/2022)     OXYGEN Inhale into the lungs. As needed (Patient not taking: Reported on 02/21/2022)     No current facility-administered medications for this visit.    Allergies as of 02/21/2022 - Review Complete 02/21/2022  Allergen Reaction Noted   Other Nausea And Vomiting and Other (See Comments) 08/04/2015   Fentanyl Nausea And Vomiting and Other (See Comments) 11/09/2016    Family History  Problem Relation Age of Onset    Cancer Mother 47       colon   Cancer Father        renal cell   Cancer Sister        primary brain, and primary breast too   Cancer Sister        breast    Cancer Brother        bladder   Cancer Other        multiple aunts with breast, cervical, or colon cancer   Sleep apnea Neg Hx     Social History   Socioeconomic History   Marital status: Widowed    Spouse name: Not on file   Number of children: 0   Years of education: Not on file   Highest education level: Bachelor's degree (e.g., BA, AB, BS)  Occupational History   Occupation: Retired  Tobacco Use   Smoking status: Never   Smokeless tobacco: Never  Vaping Use   Vaping Use: Never used  Substance and Sexual Activity   Alcohol use: No    Alcohol/week: 0.0 standard drinks of alcohol   Drug use: No   Sexual activity: Not Currently    Partners: Male  Other Topics Concern   Not on file  Social History Narrative   Drinks about 1 cup coffee day.   Cardiac rehab 3x week   One level home with significant other   Bachelors in Accounting      Right handed   No children   widow   Social Determinants of Health   Financial Resource Strain: Not on file  Food Insecurity: Not on file  Transportation Needs: Not on file  Physical Activity: Not on file  Stress: Not on file  Social Connections: Not on file     Review of Systems   Gen: Denies fever, chills, anorexia. Denies fatigue, weakness, weight loss.  CV: Denies chest pain, palpitations, syncope, peripheral edema, and claudication. Resp: Denies dyspnea at rest, cough, wheezing, coughing up blood, and pleurisy. GI: See HPI Derm: Denies rash, itching, dry skin Psych: Denies depression, anxiety, memory loss, confusion.  No homicidal or suicidal ideation.  Heme: Denies bruising, bleeding, and enlarged lymph nodes.   Physical Exam   BP 105/65 (BP Location: Left Arm, Patient Position: Sitting, Cuff Size: Large)   Pulse (!) 101   Temp 97.9 F (36.6 C) (Temporal)    Ht '5\' 8"'$  (1.727 m)   Wt 251 lb 9.6 oz (114.1 kg)   BMI 38.26 kg/m   General:   Alert and oriented. No distress noted. Pleasant and cooperative.  Head:  Normocephalic and atraumatic. Eyes:  Conjuctiva clear without scleral icterus. Mouth:  Oral mucosa pink and moist. Good dentition. No lesions. Lungs:  Clear to auscultation bilaterally. No wheezes, rales, or rhonchi. No distress.  Heart:  S1, S2 present without murmurs appreciated.  Abdomen:  +BS, soft, non-tender and non-distended. No rebound or guarding. No HSM or masses noted. Rectal: deferred Msk:  Symmetrical without gross deformities. Normal posture. Extremities:  Without edema. Neurologic:  Alert and  oriented x4 Psych:  Alert and cooperative. Normal mood and affect.   Assessment  Jodi Ray is a 61 y.o. female with a history of anemia, brain aneurysm, CAD s/p stent in 2019, diabetes, chronic pain, GERD, HTN, hypothyroidism, OSA on CPAP, and CKD stage IIIb presenting today to schedule TCS.  Family history of colon cancer: Last colonoscopy in 2018 with evidence of internal hemorrhoids, redundant left colon, and negative random colon biopsies for microscopic colitis.  Normal symptoms present.  Patient is currently having looser stools however this is common for her and occurs during the summertime when she eats more fresh fruits and vegetables.  She has softer more formed stools in the wintertime when she is eating more grains and beans.  She denies any unintentional weight loss but has been working on losing weight and gaining muscle.  Her goal is 200 pounds.  We will proceed with colonoscopy with propofol in the near future with Dr. Abbey Chatters.  Due to her kidney disease she will prep with TriLyte, Nulytely, or GoLytely.  She will hold her oral diabetes medications the night prior to morning of the procedure and she will self manage her insulin pump.  We will get her scheduled after she has completed cardiology work-up for  palpitations.  GERD: EGD in 2018 with gastritis.  This felt as though her nausea right upper quadrant pain were most likely secondary to aspirin use for H. pylori.  She had benign gastric polyps.  Patient reports chronic upper abdominal pain but is fairly controlled with pantoprazole 40 mg daily.  She states her insurance will only cover 1 prescription per year.  I have refilled prescription to her pharmacy and advised to use good Rx to more affordably obtain medication.   PLAN   Proceed with colonoscopy with propofol by Dr. Abbey Chatters in near future: the risks, benefits, and alternatives have been discussed with the patient in detail. The patient states understanding and desires to proceed.  ASA 3 Cardiac clearance Trilyte, Nulytely or Golytlely prep Hold oral diabetes medications the night prior to morning of procedure. Continue to monitor blood sugar closely, insulin pump can remain in place. Continue pantoprazole 40 mg daily.  Follow up as needed.     Venetia Night, MSN, FNP-BC, AGACNP-BC [facility]

## 2022-02-21 ENCOUNTER — Telehealth: Payer: Self-pay | Admitting: *Deleted

## 2022-02-21 ENCOUNTER — Ambulatory Visit (INDEPENDENT_AMBULATORY_CARE_PROVIDER_SITE_OTHER): Payer: Medicare Other | Admitting: Gastroenterology

## 2022-02-21 ENCOUNTER — Encounter: Payer: Self-pay | Admitting: Gastroenterology

## 2022-02-21 VITALS — BP 105/65 | HR 101 | Temp 97.9°F | Ht 68.0 in | Wt 251.6 lb

## 2022-02-21 DIAGNOSIS — Z800 Family history of malignant neoplasm of digestive organs: Secondary | ICD-10-CM

## 2022-02-21 DIAGNOSIS — K219 Gastro-esophageal reflux disease without esophagitis: Secondary | ICD-10-CM

## 2022-02-21 MED ORDER — PANTOPRAZOLE SODIUM 40 MG PO TBEC: 40.0000 mg | DELAYED_RELEASE_TABLET | Freq: Every day | ORAL | 3 refills | Status: DC

## 2022-02-21 NOTE — Telephone Encounter (Signed)
   02/21/22  Jodi Ray Apr 23, 1961  What type of surgery is being performed? Colonoscopy   When is surgery scheduled? TBD  What type of clearance is required (medical or pharmacy to hold medication or both? Cardiac Clearance  Name of physician performing surgery?  Dr. Hurshel Keys [facility]   Anethesia type (none, local, MAC, general)? MAC

## 2022-02-21 NOTE — Patient Instructions (Signed)
We will be scheduling you for colonoscopy in the near future with Dr. Abbey Chatters.  He has taken the place of Dr. Oneida Alar.  Would likely will need to wait until September given our availability and will reach out to cardiology for clearance prior to scheduling since you are undergoing work-up for palpitations.  Continue her pantoprazole 40 mg daily.  I will look into more affordable options for you to obtain your medication or see if ordering a higher supply amount is feasible.   I hope you enjoy the rest of your summer!  Continue the good work on your glucose control and your exercise.   It was a pleasure to see you today. I want to create trusting relationships with patients. If you receive a survey regarding your visit,  I greatly appreciate you taking time to fill this out on paper or through your MyChart. I value your feedback.  Venetia Night, MSN, FNP-BC, AGACNP-BC [facility]

## 2022-02-22 ENCOUNTER — Ambulatory Visit (INDEPENDENT_AMBULATORY_CARE_PROVIDER_SITE_OTHER): Payer: Medicare Other

## 2022-02-22 DIAGNOSIS — J309 Allergic rhinitis, unspecified: Secondary | ICD-10-CM

## 2022-02-23 ENCOUNTER — Other Ambulatory Visit (HOSPITAL_COMMUNITY)
Admission: RE | Admit: 2022-02-23 | Discharge: 2022-02-23 | Disposition: A | Discharge status: Home / Self Care | Payer: Medicare Other | Source: Ambulatory Visit | Attending: Nephrology | Admitting: Nephrology

## 2022-02-23 DIAGNOSIS — E875 Hyperkalemia: Secondary | ICD-10-CM | POA: Diagnosis not present

## 2022-02-23 DIAGNOSIS — N189 Chronic kidney disease, unspecified: Secondary | ICD-10-CM | POA: Diagnosis not present

## 2022-02-23 DIAGNOSIS — E1129 Type 2 diabetes mellitus with other diabetic kidney complication: Secondary | ICD-10-CM | POA: Insufficient documentation

## 2022-02-23 DIAGNOSIS — R809 Proteinuria, unspecified: Secondary | ICD-10-CM | POA: Insufficient documentation

## 2022-02-23 DIAGNOSIS — E1122 Type 2 diabetes mellitus with diabetic chronic kidney disease: Secondary | ICD-10-CM | POA: Insufficient documentation

## 2022-02-23 LAB — POTASSIUM: Potassium: 4.8 mmol/L (ref 3.5–5.1)

## 2022-02-28 ENCOUNTER — Ambulatory Visit
Admission: RE | Admit: 2022-02-28 | Discharge: 2022-02-28 | Disposition: A | Discharge status: Home / Self Care | Payer: Medicare Other | Source: Ambulatory Visit | Attending: Internal Medicine | Admitting: Internal Medicine

## 2022-02-28 DIAGNOSIS — Z1231 Encounter for screening mammogram for malignant neoplasm of breast: Secondary | ICD-10-CM | POA: Diagnosis not present

## 2022-03-01 ENCOUNTER — Ambulatory Visit (INDEPENDENT_AMBULATORY_CARE_PROVIDER_SITE_OTHER): Payer: Medicare Other

## 2022-03-01 DIAGNOSIS — J309 Allergic rhinitis, unspecified: Secondary | ICD-10-CM | POA: Diagnosis not present

## 2022-03-07 DIAGNOSIS — R002 Palpitations: Secondary | ICD-10-CM | POA: Diagnosis not present

## 2022-03-08 ENCOUNTER — Ambulatory Visit (INDEPENDENT_AMBULATORY_CARE_PROVIDER_SITE_OTHER): Payer: Medicare Other

## 2022-03-08 DIAGNOSIS — J309 Allergic rhinitis, unspecified: Secondary | ICD-10-CM | POA: Diagnosis not present

## 2022-03-09 ENCOUNTER — Telehealth: Payer: Self-pay | Admitting: Cardiology

## 2022-03-09 NOTE — Telephone Encounter (Signed)
Monitor placed in Dr. Nelly Laurence inbox for review.  Pt notified and agrees with plan of care.

## 2022-03-09 NOTE — Telephone Encounter (Signed)
Patient called to follow-up on results of monitor testing.  Patient stated these results would need to be available to [facility] so her colonoscopy can scheduled.

## 2022-03-09 NOTE — Telephone Encounter (Signed)
Covering preop today. Patient seen in clinic recently by Dr. Harl Bowie 12/2021 with chest pain. Lexiscan was low risk. Patient had event monitor, awaiting finalization of report. I will route to Dr. Harl Bowie to make sure he knows that patient is awaiting clearance for colonoscopy to please also let preop team know if patient may proceed based on completion of studies. Dr. Harl Bowie - Please route response to [facility] PREOP (the pre-op pool). Thank you.

## 2022-03-13 NOTE — Telephone Encounter (Signed)
Monitor with just benign ectopy, stress test was benign. Ok to proceed with low risk procedure colonscopy.  Zandra Abts MD

## 2022-03-13 NOTE — Telephone Encounter (Signed)
Patient is following up regarding monitor results. She would like to know if Dr. Harl Bowie has had an opportunity to review them. Please advise.

## 2022-03-14 NOTE — Telephone Encounter (Signed)
I have obtained both the ph and fax #. I will officially fax to DR. Carver the clearance notes.

## 2022-03-14 NOTE — Telephone Encounter (Signed)
Fowarding to [place] for review.

## 2022-03-14 NOTE — Telephone Encounter (Signed)
Arnoldo Lenis, MD  [employer], Marikay Alar, LPN Monitor shows just some occasoinal extra heart beats, at times can have a few in a row. This is not dangerous but can cause palpitations. Can she clarify how she has been taking her coreg, we may adjust if she still having palpitations as this medication can help prevent palpitations, please clarify how the palpitations have been.   Zandra Abts MD     Jodi Ray is only taking coreg 3.125 mg once a day. She states she feels every single skipped beat. She limits caffeine already to a cup of coffee in the am.

## 2022-03-15 ENCOUNTER — Ambulatory Visit (INDEPENDENT_AMBULATORY_CARE_PROVIDER_SITE_OTHER): Payer: Medicare Other

## 2022-03-15 DIAGNOSIS — J309 Allergic rhinitis, unspecified: Secondary | ICD-10-CM

## 2022-03-17 MED ORDER — CARVEDILOL 3.125 MG PO TABS: 3.1250 mg | ORAL_TABLET | Freq: Two times a day (BID) | ORAL | 3 refills | Status: DC

## 2022-03-17 NOTE — Telephone Encounter (Signed)
Pleae have her start taking coreg 3.'125mg'$  twice daily and update [place] on symptoms  Zandra Abts MD

## 2022-03-17 NOTE — Telephone Encounter (Signed)
Patient notified and verbalized understanding. Patient will update office on bp next week.  

## 2022-03-17 NOTE — Telephone Encounter (Signed)
Okay to schedule for colonoscopy.  Thank you

## 2022-03-20 ENCOUNTER — Telehealth: Payer: Self-pay | Admitting: Cardiology

## 2022-03-20 MED ORDER — METOPROLOL TARTRATE 25 MG PO TABS: 12.5000 mg | ORAL_TABLET | Freq: Two times a day (BID) | ORAL | 3 refills | Status: DC

## 2022-03-20 NOTE — Telephone Encounter (Addendum)
She was only taking Coreg once a day, increased to bid on 03/09/22. Forwarded  to [employer]

## 2022-03-20 NOTE — Telephone Encounter (Signed)
LMOVM to call back 

## 2022-03-20 NOTE — Telephone Encounter (Signed)
New Message:    Patient said she talked to the nurse on Friday(03-17-22).She told her take her blood pressure for 3 days and call back to report them.     03-17-22 before dinner- 119/60     after dinner 89/52  03-18-22-before dinner - 121/73     after dinner 88/51  03-19-22- before dinner    94/52       after dinner 79/50- she said she felt awful when she had this reading

## 2022-03-20 NOTE — Telephone Encounter (Signed)
Can we change coreg to lopressor 12.'5mg'$  bid, has the same benefits of helping prevent extra heart beats with less effects on blood pressure. Update [place] later in the week  Zandra Abts MD

## 2022-03-20 NOTE — Telephone Encounter (Signed)
Spoke with patient and she agrees to switch to lopressor 12.5 mg bid. She will call us next week with update.

## 2022-03-21 ENCOUNTER — Encounter: Payer: Self-pay | Admitting: *Deleted

## 2022-03-21 MED ORDER — PEG 3350-KCL-NA BICARB-NACL 420 G PO SOLR: 4000.0000 mL | Freq: Once | ORAL | 0 refills | Status: AC

## 2022-03-21 NOTE — Telephone Encounter (Signed)
Pt called back. She has been scheduled for 9/18 at 12:30pm. Aware will mail instructions/pre-op appt. Rx for prep sent to pharmacy

## 2022-03-21 NOTE — Addendum Note (Signed)
Addended by: Cheron Every on: 03/21/2022 11:13 AM   Modules accepted: Orders

## 2022-03-22 ENCOUNTER — Ambulatory Visit (INDEPENDENT_AMBULATORY_CARE_PROVIDER_SITE_OTHER): Payer: Medicare Other

## 2022-03-22 DIAGNOSIS — J309 Allergic rhinitis, unspecified: Secondary | ICD-10-CM

## 2022-03-23 ENCOUNTER — Telehealth: Payer: Self-pay | Admitting: Cardiology

## 2022-03-23 NOTE — Telephone Encounter (Signed)
Pt c/o BP issue: STAT if pt c/o blurred vision, one-sided weakness or slurred speech  1. What are your last 5 BP readings?   8/22 120/61 5pm 84/59  8pm  8/23 98/52 am 84/54 pm  8/24 98/50  am 58/42  now  2. Are you having any other symptoms (ex. Dizziness, headache, blurred vision, passed out)?  Some dizziness  3. What is your BP issue?   Patient stated she has been taking the metoprolol tartrate (LOPRESSOR) 25 MG tablet  as prescribed and she thinks this medication is not working for her.

## 2022-03-23 NOTE — Telephone Encounter (Signed)
Spoke to pt who stated that she feels horrible. Pt c/o dizziness, fatigue,and has a fast hr. Pt stated that this medication may be too strong for her.  Please advise.

## 2022-03-28 ENCOUNTER — Encounter: Payer: Self-pay | Admitting: *Deleted

## 2022-03-28 ENCOUNTER — Telehealth: Payer: Self-pay | Admitting: Internal Medicine

## 2022-03-28 NOTE — Telephone Encounter (Signed)
Pt needs to speak with nurse regarding her procedure and prep. [phone]

## 2022-03-29 ENCOUNTER — Ambulatory Visit (INDEPENDENT_AMBULATORY_CARE_PROVIDER_SITE_OTHER): Payer: Medicare Other

## 2022-03-29 DIAGNOSIS — J309 Allergic rhinitis, unspecified: Secondary | ICD-10-CM

## 2022-03-29 NOTE — Telephone Encounter (Signed)
Pt rescheduled appt for 04/20/22 for an earlier time. New instructions sent to pt via MyChart.

## 2022-04-04 NOTE — Telephone Encounter (Signed)
Can she update [place] on symptoms and blood pressures. The meds that help prevent extra heart beats also lower blood pressure. If ongonig issues we could lower her lisinopril to allow more room wih bp to possibly tolerate one of these medications   Zandra Abts MD

## 2022-04-04 NOTE — Telephone Encounter (Signed)
Spoke to pt who c/o dizziness, fatigue. Pt stated that she had stopped the Coreg until hearing back from provider. Pt stated that she has not been keeping up with bp at home. Pt took bp while on the phone with me. BP- 88/52, hr- 88.   Please advise.

## 2022-04-05 ENCOUNTER — Ambulatory Visit (INDEPENDENT_AMBULATORY_CARE_PROVIDER_SITE_OTHER): Payer: Medicare Other

## 2022-04-05 ENCOUNTER — Other Ambulatory Visit (HOSPITAL_COMMUNITY)
Admission: RE | Admit: 2022-04-05 | Discharge: 2022-04-05 | Disposition: A | Discharge status: Home / Self Care | Payer: Medicare Other | Source: Ambulatory Visit | Attending: Nephrology | Admitting: Nephrology

## 2022-04-05 DIAGNOSIS — R809 Proteinuria, unspecified: Secondary | ICD-10-CM | POA: Diagnosis not present

## 2022-04-05 DIAGNOSIS — J309 Allergic rhinitis, unspecified: Secondary | ICD-10-CM

## 2022-04-05 DIAGNOSIS — I5032 Chronic diastolic (congestive) heart failure: Secondary | ICD-10-CM | POA: Diagnosis not present

## 2022-04-05 DIAGNOSIS — N189 Chronic kidney disease, unspecified: Secondary | ICD-10-CM | POA: Diagnosis not present

## 2022-04-05 DIAGNOSIS — N170 Acute kidney failure with tubular necrosis: Secondary | ICD-10-CM | POA: Diagnosis not present

## 2022-04-05 DIAGNOSIS — E1129 Type 2 diabetes mellitus with other diabetic kidney complication: Secondary | ICD-10-CM | POA: Diagnosis not present

## 2022-04-05 DIAGNOSIS — I129 Hypertensive chronic kidney disease with stage 1 through stage 4 chronic kidney disease, or unspecified chronic kidney disease: Secondary | ICD-10-CM | POA: Diagnosis not present

## 2022-04-05 DIAGNOSIS — E1122 Type 2 diabetes mellitus with diabetic chronic kidney disease: Secondary | ICD-10-CM | POA: Diagnosis not present

## 2022-04-05 LAB — CBC
HCT: 37.5 % (ref 36.0–46.0)
Hemoglobin: 12.1 g/dL (ref 12.0–15.0)
MCH: 29.7 pg (ref 26.0–34.0)
MCHC: 32.3 g/dL (ref 30.0–36.0)
MCV: 91.9 fL (ref 80.0–100.0)
Platelets: 213 10*3/uL (ref 150–400)
RBC: 4.08 MIL/uL (ref 3.87–5.11)
RDW: 14 % (ref 11.5–15.5)
WBC: 6.7 10*3/uL (ref 4.0–10.5)
nRBC: 0 % (ref 0.0–0.2)

## 2022-04-05 LAB — RENAL FUNCTION PANEL
Albumin: 4 g/dL (ref 3.5–5.0)
Anion gap: 10 (ref 5–15)
BUN: 73 mg/dL — ABNORMAL HIGH (ref 8–23)
CO2: 26 mmol/L (ref 22–32)
Calcium: 9.4 mg/dL (ref 8.9–10.3)
Chloride: 104 mmol/L (ref 98–111)
Creatinine, Ser: 2.29 mg/dL — ABNORMAL HIGH (ref 0.44–1.00)
GFR, Estimated: 23 mL/min — ABNORMAL LOW (ref >60)
Glucose, Bld: 78 mg/dL (ref 70–99)
Phosphorus: 4.3 mg/dL (ref 2.5–4.6)
Potassium: 4.6 mmol/L (ref 3.5–5.1)
Sodium: 140 mmol/L (ref 135–145)

## 2022-04-05 LAB — PROTEIN / CREATININE RATIO, URINE
Creatinine, Urine: 32.76 mg/dL
Total Protein, Urine: <6 mg/dL

## 2022-04-06 DIAGNOSIS — I10 Essential (primary) hypertension: Secondary | ICD-10-CM | POA: Diagnosis not present

## 2022-04-06 DIAGNOSIS — E669 Obesity, unspecified: Secondary | ICD-10-CM | POA: Diagnosis not present

## 2022-04-06 DIAGNOSIS — E039 Hypothyroidism, unspecified: Secondary | ICD-10-CM | POA: Diagnosis not present

## 2022-04-06 DIAGNOSIS — I2510 Atherosclerotic heart disease of native coronary artery without angina pectoris: Secondary | ICD-10-CM | POA: Diagnosis not present

## 2022-04-06 DIAGNOSIS — E785 Hyperlipidemia, unspecified: Secondary | ICD-10-CM | POA: Diagnosis not present

## 2022-04-06 DIAGNOSIS — E11319 Type 2 diabetes mellitus with unspecified diabetic retinopathy without macular edema: Secondary | ICD-10-CM | POA: Diagnosis not present

## 2022-04-06 DIAGNOSIS — M1460 Charcot's joint, unspecified site: Secondary | ICD-10-CM | POA: Diagnosis not present

## 2022-04-06 DIAGNOSIS — E118 Type 2 diabetes mellitus with unspecified complications: Secondary | ICD-10-CM | POA: Diagnosis not present

## 2022-04-06 DIAGNOSIS — Z9641 Presence of insulin pump (external) (internal): Secondary | ICD-10-CM | POA: Diagnosis not present

## 2022-04-06 DIAGNOSIS — E1140 Type 2 diabetes mellitus with diabetic neuropathy, unspecified: Secondary | ICD-10-CM | POA: Diagnosis not present

## 2022-04-06 DIAGNOSIS — E109 Type 1 diabetes mellitus without complications: Secondary | ICD-10-CM | POA: Diagnosis not present

## 2022-04-06 DIAGNOSIS — Z794 Long term (current) use of insulin: Secondary | ICD-10-CM | POA: Diagnosis not present

## 2022-04-06 NOTE — Telephone Encounter (Signed)
Can we lower her lisinopril to 2.'5mg'$  daily and have her update her bp's. Perhals on lower dose lisinopril blood pressure will be able to tolerate a low dose of medication to help limit the extra heart beats   Jodi Abts MD

## 2022-04-06 NOTE — Telephone Encounter (Signed)
Spoke to pt who stated Dr Theador Hawthorne, nephrology, stopped Lisinopril completely. Pt will follow bp/hr at home and report back to [place].

## 2022-04-11 NOTE — Patient Instructions (Signed)
Jodi Ray  04/11/2022     '@PREFPERIOPPHARMACY'$ @   Your procedure is scheduled on  04/20/2022.   Report to [facility] at  0730  A.M.   Call this number if you have problems the morning of surgery:  [phone]   Remember:  Follow the diet and prep instructions given to you by the office.     Your last dose of ozempic should be 9/14 or before.      DO NOT take your jardiance on 04/19/2022.      Dial your insulin pump down to your basal rate before bed on 04/19/2022.      DO NOT take any medications for diabetes the morning of your procedure.      Bring extra dexcom supplies in case ir becomes dislodged during your procedure.      Take these medicines the morning of surgery with A SIP OF WATER                                   coreg, kerendia, synthroid, protonix.     Do not wear jewelry, make-up or nail polish.  Do not wear lotions, powders, or perfumes, or deodorant.  Do not shave 48 hours prior to surgery.  Men may shave face and neck.  Do not bring valuables to the hospital.  [facility] is not responsible for any belongings or valuables.  Contacts, dentures or bridgework may not be worn into surgery.  Leave your suitcase in the car.  After surgery it may be brought to your room.  For patients admitted to the hospital, discharge time will be determined by your treatment team.  Patients discharged the day of surgery will not be allowed to drive home and must have someone with them for 24 hours.    Special instructions:   DO NOT smoke tobacco or vape for 24 hours before your procedure.  Please read over the following fact sheets that you were given. Anesthesia Post-op Instructions and Care and Recovery After Surgery      Colonoscopy, Adult, Care After The following information offers guidance on how to care for yourself after your procedure. Your health care provider may also give you more specific instructions. If you have problems or questions,  contact your health care provider. What can I expect after the procedure? After the procedure, it is common to have: A small amount of blood in your stool for 24 hours after the procedure. Some gas. Mild cramping or bloating of your abdomen. Follow these instructions at home: Eating and drinking  Drink enough fluid to keep your urine pale yellow. Follow instructions from your health care provider about eating or drinking restrictions. Resume your normal diet as told by your health care provider. Avoid heavy or fried foods that are hard to digest. Activity Rest as told by your health care provider. Avoid sitting for a long time without moving. Get up to take short walks every 1-2 hours. This is important to improve blood flow and breathing. Ask for help if you feel weak or unsteady. Return to your normal activities as told by your health care provider. Ask your health care provider what activities are safe for you. Managing cramping and bloating  Try walking around when you have cramps or feel bloated. If directed, apply heat to your abdomen as told by your health care provider. Use the heat source that  your health care provider recommends, such as a moist heat pack or a heating pad. Place a towel between your skin and the heat source. Leave the heat on for 20-30 minutes. Remove the heat if your skin turns bright red. This is especially important if you are unable to feel pain, heat, or cold. You have a greater risk of getting burned. General instructions If you were given a sedative during the procedure, it can affect you for several hours. Do not drive or operate machinery until your health care provider says that it is safe. For the first 24 hours after the procedure: Do not sign important documents. Do not drink alcohol. Do your regular daily activities at a slower pace than normal. Eat soft foods that are easy to digest. Take over-the-counter and prescription medicines only as told  by your health care provider. Keep all follow-up visits. This is important. Contact a health care provider if: You have blood in your stool 2-3 days after the procedure. Get help right away if: You have more than a small spotting of blood in your stool. You have large blood clots in your stool. You have swelling of your abdomen. You have nausea or vomiting. You have a fever. You have increasing pain in your abdomen that is not relieved with medicine. These symptoms may be an emergency. Get help right away. Call 911. Do not wait to see if the symptoms will go away. Do not drive yourself to the hospital. Summary After the procedure, it is common to have a small amount of blood in your stool. You may also have mild cramping and bloating of your abdomen. If you were given a sedative during the procedure, it can affect you for several hours. Do not drive or operate machinery until your health care provider says that it is safe. Get help right away if you have a lot of blood in your stool, nausea or vomiting, a fever, or increased pain in your abdomen. This information is not intended to replace advice given to you by your health care provider. Make sure you discuss any questions you have with your health care provider. Document Revised: 03/09/2021 Document Reviewed: 03/09/2021 Elsevier Patient Education  [place] After This sheet gives you information about how to care for yourself after your procedure. Your health care provider may also give you more specific instructions. If you have problems or questions, contact your health care provider. What can I expect after the procedure? After the procedure, it is common to have: Tiredness. Forgetfulness about what happened after the procedure. Impaired judgment for important decisions. Nausea or vomiting. Some difficulty with balance. Follow these instructions at home: For the time period you were told  by your health care provider:     Rest as needed. Do not participate in activities where you could fall or become injured. Do not drive or use machinery. Do not drink alcohol. Do not take sleeping pills or medicines that cause drowsiness. Do not make important decisions or sign legal documents. Do not take care of children on your own. Eating and drinking Follow the diet that is recommended by your health care provider. Drink enough fluid to keep your urine pale yellow. If you vomit: Drink water, juice, or soup when you can drink without vomiting. Make sure you have little or no nausea before eating solid foods. General instructions Have a responsible adult stay with you for the time you are told. It is important to  have someone help care for you until you are awake and alert. Take over-the-counter and prescription medicines only as told by your health care provider. If you have sleep apnea, surgery and certain medicines can increase your risk for breathing problems. Follow instructions from your health care provider about wearing your sleep device: Anytime you are sleeping, including during daytime naps. While taking prescription pain medicines, sleeping medicines, or medicines that make you drowsy. Avoid smoking. Keep all follow-up visits as told by your health care provider. This is important. Contact a health care provider if: You keep feeling nauseous or you keep vomiting. You feel light-headed. You are still sleepy or having trouble with balance after 24 hours. You develop a rash. You have a fever. You have redness or swelling around the IV site. Get help right away if: You have trouble breathing. You have new-onset confusion at home. Summary For several hours after your procedure, you may feel tired. You may also be forgetful and have poor judgment. Have a responsible adult stay with you for the time you are told. It is important to have someone help care for you until you  are awake and alert. Rest as told. Do not drive or operate machinery. Do not drink alcohol or take sleeping pills. Get help right away if you have trouble breathing, or if you suddenly become confused. This information is not intended to replace advice given to you by your health care provider. Make sure you discuss any questions you have with your health care provider. Document Revised: 06/21/2021 Document Reviewed: 06/19/2019 Elsevier Patient Education  [place].

## 2022-04-12 ENCOUNTER — Ambulatory Visit (INDEPENDENT_AMBULATORY_CARE_PROVIDER_SITE_OTHER): Payer: Medicare Other

## 2022-04-12 ENCOUNTER — Encounter (HOSPITAL_COMMUNITY)
Admission: RE | Admit: 2022-04-12 | Discharge: 2022-04-12 | Disposition: A | Discharge status: Home / Self Care | Payer: Medicare Other | Source: Ambulatory Visit | Attending: Internal Medicine | Admitting: Internal Medicine

## 2022-04-12 VITALS — BP 128/62 | HR 98 | Temp 98.6°F | Resp 18 | Ht 68.0 in | Wt 251.5 lb

## 2022-04-12 DIAGNOSIS — J309 Allergic rhinitis, unspecified: Secondary | ICD-10-CM

## 2022-04-12 DIAGNOSIS — E1040 Type 1 diabetes mellitus with diabetic neuropathy, unspecified: Secondary | ICD-10-CM | POA: Diagnosis not present

## 2022-04-12 DIAGNOSIS — Z01812 Encounter for preprocedural laboratory examination: Secondary | ICD-10-CM | POA: Insufficient documentation

## 2022-04-12 LAB — BASIC METABOLIC PANEL
Anion gap: 10 (ref 5–15)
BUN: 45 mg/dL — ABNORMAL HIGH (ref 8–23)
CO2: 24 mmol/L (ref 22–32)
Calcium: 9.1 mg/dL (ref 8.9–10.3)
Chloride: 102 mmol/L (ref 98–111)
Creatinine, Ser: 1.57 mg/dL — ABNORMAL HIGH (ref 0.44–1.00)
GFR, Estimated: 37 mL/min — ABNORMAL LOW (ref >60)
Glucose, Bld: 282 mg/dL — ABNORMAL HIGH (ref 70–99)
Potassium: 4 mmol/L (ref 3.5–5.1)
Sodium: 136 mmol/L (ref 135–145)

## 2022-04-14 ENCOUNTER — Ambulatory Visit (INDEPENDENT_AMBULATORY_CARE_PROVIDER_SITE_OTHER): Payer: Medicare Other | Admitting: Allergy & Immunology

## 2022-04-14 ENCOUNTER — Encounter: Payer: Self-pay | Admitting: Allergy & Immunology

## 2022-04-14 VITALS — BP 128/80 | HR 80 | Temp 97.7°F | Resp 18

## 2022-04-14 DIAGNOSIS — I2510 Atherosclerotic heart disease of native coronary artery without angina pectoris: Secondary | ICD-10-CM | POA: Diagnosis not present

## 2022-04-14 DIAGNOSIS — Z79899 Other long term (current) drug therapy: Secondary | ICD-10-CM

## 2022-04-14 DIAGNOSIS — N179 Acute kidney failure, unspecified: Secondary | ICD-10-CM

## 2022-04-14 DIAGNOSIS — J3089 Other allergic rhinitis: Secondary | ICD-10-CM | POA: Diagnosis not present

## 2022-04-14 MED ORDER — LORATADINE 10 MG PO TABS
20.0000 mg | ORAL_TABLET | Freq: Every day | ORAL | 3 refills | Status: DC
Start: 2022-04-14 — End: 2023-04-18

## 2022-04-14 NOTE — Progress Notes (Signed)
FOLLOW UP  Date of Service/Encounter:  04/14/22   Assessment:   Seasonal and perennial allergic rhinitis (grasses, ragweed, weeds, trees, indoor molds, outdoor molds, dust mites, cat, and dog) - on allergen immunotherapy   On beta blocker therapy   Complicated past medical history including renal failure as well as type 1 diabetes and migraines   GFR 37 (September 2023)  Plan/Recommendations:   1. Chronic rhinitis (grasses, ragweed, weeds, trees, indoor molds, outdoor molds, dust mites, cat, and dog) - Continue with shots at the same schedule. - Hopefully this time next year will be even better with the higher dose allergen vials that you are going to be on.  - Continue taking:  Claritin (loratadine) '10mg'$  daily (ASK your nephrologist whether it is ok to take two daily). - I sent in a script in case this is cheaper.  - We will plan for 3-5 years treatment in total.   2. Return in about 6 months (around 10/13/2022).   Subjective:   Jodi Ray is a 61 y.o. female presenting today for follow up of  Chief Complaint  Patient presents with   Allergic Rhinitis     No change     VIANNE GRIESHOP has a history of the following: Patient Active Problem List   Diagnosis Date Noted   Seasonal and perennial allergic rhinitis 07/13/2021   Diabetic ketoacidosis without coma associated with type 1 diabetes mellitus ([place]) 11/15/2019   Class 1 obesity 11/15/2019   Acute renal failure superimposed on stage 3 chronic kidney disease ([place]) 11/15/2019   CAD (coronary artery disease) 04/16/2018   Unstable angina ([place]) 03/21/2018   Atypical chest pain 03/20/2018   Gastritis and gastroduodenitis 03/05/2017   Suprapubic pain 03/05/2017   Right sided abdominal pain 10/24/2016   Diarrhea 10/24/2016   Family hx of colon cancer 10/24/2016   Varicose veins of bilateral lower extremities with other complications 32/95/1884   Insomnia with sleep apnea 03/09/2016   Type I diabetes mellitus,  uncontrolled 04/30/2015   Mixed hyperlipidemia 04/30/2015   Primary hypothyroidism 04/30/2015   Benign hypertension 04/30/2015   OSA on CPAP 03/10/2015   Migraine without aura and without status migrainosus, not intractable 01/08/2015   Primary stabbing headache 10/02/2014    History obtained from: chart review and patient.  Jodi Ray is a 61 y.o. female presenting for a follow up visit.  She was last seen in March 2023.  At that time, we continued with shots at the same schedule.  We also continue with Zyrtec 10 mg daily.  We recommended taking Claritin as needed for breakthrough symptoms since this is the least metabolized by the kidneys.  Since last visit she has largely done well.   Allergic Rhinitis Symptom History: She remains on Claritin. GFR is going down. She was taken off the cetirizine completely and is only on the Claritin the morning. She hates nose sprays a lot.  She has never been a fan of the sprays.  She has not needed antibiotics.  She has not been having sinus infections.  Jodi Ray is on allergen immunotherapy. She receives two injections. Immunotherapy script #1 contains trees, weeds, grasses, cat, and dog. She currently receives 0.38m of the GREEN vial (1/1,000). Immunotherapy script #2 contains molds and dust mites. She currently receives 0.46mof the GREEN vial (1/1,000). She started shots January of 2023 and not yet reached maintenance.  She did have 1 large local reaction when there was a lot of pollen outdoors, otherwise she has tolerated  her advances.  She did stop her antihistamines because she was under the impression that we are going to skin test today.  Evidently, at her old allergy practice, they would skin testing every 3 months.  I told her that there is no reason for this to happen if her shots are going well and her symptoms are getting better.  Her boyfriend of 95 years old and has been some memory issues.  This is following his episode of sepsis that  occurred around 1 year ago after a back surgery.  She is finding it rather stressful to try to manage all of this.  She does get respite care on Mondays, Wednesdays, and Fridays.  Otherwise, there have been no changes to her past medical history, surgical history, family history, or social history.    Review of Systems  Constitutional: Negative.  Negative for chills, fever, malaise/fatigue and weight loss.  HENT:  Positive for congestion. Negative for ear discharge, ear pain and sinus pain.        Positive for postnasal drip.  Eyes:  Negative for pain, discharge and redness.  Respiratory:  Negative for cough, sputum production, shortness of breath and wheezing.   Cardiovascular: Negative.  Negative for chest pain and palpitations.  Gastrointestinal:  Negative for abdominal pain, constipation, diarrhea, heartburn, nausea and vomiting.  Skin: Negative.  Negative for itching and rash.  Neurological:  Negative for dizziness and headaches.  Endo/Heme/Allergies:  Positive for environmental allergies. Does not bruise/bleed easily.       Objective:   Blood pressure 128/80, pulse 80, temperature 97.7 F (36.5 C), resp. rate 18, SpO2 97 %. There is no height or weight on file to calculate BMI.    Physical Exam Vitals reviewed.  Constitutional:      Appearance: She is well-developed and overweight.     Comments: Very talkative.  Smiling.  Very pleasant.  HENT:     Head: Normocephalic and atraumatic.     Right Ear: Tympanic membrane, ear canal and external ear normal. No drainage, swelling or tenderness. Tympanic membrane is not injected, scarred, erythematous, retracted or bulging.     Left Ear: Tympanic membrane, ear canal and external ear normal. No drainage, swelling or tenderness. Tympanic membrane is not injected, scarred, erythematous, retracted or bulging.     Nose: Mucosal edema and rhinorrhea present. No nasal deformity or septal deviation.     Right Turbinates: Enlarged and  swollen. Not pale.     Left Turbinates: Enlarged and swollen. Not pale.     Right Sinus: No maxillary sinus tenderness or frontal sinus tenderness.     Left Sinus: No maxillary sinus tenderness or frontal sinus tenderness.     Comments: Clear rhinorrhea bilaterally. No nasal polyps.     Mouth/Throat:     Lips: Pink.     Mouth: Mucous membranes are moist. Mucous membranes are not pale and not dry.     Pharynx: Uvula midline.     Comments: Cobblestoning in the posterior oropharynx.  Eyes:     General:        Right eye: No discharge.        Left eye: No discharge.     Conjunctiva/sclera: Conjunctivae normal.     Right eye: Right conjunctiva is not injected. No chemosis.    Left eye: Left conjunctiva is not injected. No chemosis.    Pupils: Pupils are equal, round, and reactive to light.  Cardiovascular:     Rate and Rhythm: Normal rate and regular  rhythm.     Heart sounds: Normal heart sounds.  Pulmonary:     Effort: Pulmonary effort is normal. No tachypnea, accessory muscle usage or respiratory distress.     Breath sounds: Normal breath sounds. No wheezing, rhonchi or rales.     Comments: Moving air well in all lung fields. No increased work of breathing noted.  Chest:     Chest wall: No tenderness.  Lymphadenopathy:     Head:     Right side of head: No submandibular, tonsillar or occipital adenopathy.     Left side of head: No submandibular, tonsillar or occipital adenopathy.     Cervical: No cervical adenopathy.  Skin:    Coloration: Skin is not pale.     Findings: No abrasion, erythema, petechiae or rash. Rash is not papular, urticarial or vesicular.  Neurological:     Mental Status: She is alert.  Psychiatric:        Behavior: Behavior is cooperative.      Diagnostic studies: none      Salvatore Marvel, MD  [facility]

## 2022-04-14 NOTE — Patient Instructions (Addendum)
1. Chronic rhinitis (grasses, ragweed, weeds, trees, indoor molds, outdoor molds, dust mites, cat, and dog) - Continue with shots at the same schedule. - Hopefully this time next year will be even better with the higher dose allergen vials that you are going to be on.  - Continue taking:  Claritin (loratadine) '10mg'$  daily (ASK your nephrologist whether it is ok to take two daily). - I sent in a script in case this is cheaper.  - We will plan for 3-5 years treatment in total.   2. Return in about 6 months (around 10/13/2022).    Please inform us of any Emergency Department visits, hospitalizations, or changes in symptoms. Call us before going to the ED for breathing or allergy symptoms since we might be able to fit you in for a sick visit. Feel free to contact us anytime with any questions, problems, or concerns.  It was a pleasure to see you again today!  Websites that have reliable patient information: 1. American Academy of Asthma, Allergy, and Immunology: www.aaaai.org 2. Food Allergy Research and Education (FARE): foodallergy.org 3. Mothers of Asthmatics: http://www.asthmacommunitynetwork.org 4. American College of Allergy, Asthma, and Immunology: www.acaai.org   COVID-19 Vaccine Information can be found at: ShippingScam.co.uk For questions related to vaccine distribution or appointments, please email [email or web address] or call [phone].   We realize that you might be concerned about having an allergic reaction to the COVID19 vaccines. To help with that concern, WE ARE OFFERING THE COVID19 VACCINES IN OUR OFFICE! Ask the front desk for dates!     "Like" [place] on Facebook and Instagram for our latest updates!      A healthy democracy works best when [employer] participate! Make sure you are registered to vote! If you have moved or changed any of your contact information, you will need to get this updated before  voting!  In some cases, you MAY be able to register to vote online: [email or web address]

## 2022-04-20 ENCOUNTER — Encounter (HOSPITAL_COMMUNITY): Payer: Self-pay

## 2022-04-20 ENCOUNTER — Ambulatory Visit (HOSPITAL_BASED_OUTPATIENT_CLINIC_OR_DEPARTMENT_OTHER): Payer: Medicare Other | Admitting: Anesthesiology

## 2022-04-20 ENCOUNTER — Ambulatory Visit (HOSPITAL_COMMUNITY)
Admission: RE | Admit: 2022-04-20 | Discharge: 2022-04-20 | Disposition: A | Discharge status: Home / Self Care | Payer: Medicare Other | Attending: Internal Medicine | Admitting: Internal Medicine

## 2022-04-20 ENCOUNTER — Encounter (HOSPITAL_COMMUNITY)
Admission: RE | Disposition: A | Discharge status: Home / Self Care | Payer: Self-pay | Source: Home / Self Care | Attending: Internal Medicine

## 2022-04-20 ENCOUNTER — Ambulatory Visit (HOSPITAL_COMMUNITY): Payer: Medicare Other | Admitting: Anesthesiology

## 2022-04-20 DIAGNOSIS — Z1211 Encounter for screening for malignant neoplasm of colon: Secondary | ICD-10-CM | POA: Diagnosis not present

## 2022-04-20 DIAGNOSIS — I2510 Atherosclerotic heart disease of native coronary artery without angina pectoris: Secondary | ICD-10-CM | POA: Diagnosis not present

## 2022-04-20 DIAGNOSIS — Z800 Family history of malignant neoplasm of digestive organs: Secondary | ICD-10-CM | POA: Insufficient documentation

## 2022-04-20 DIAGNOSIS — K648 Other hemorrhoids: Secondary | ICD-10-CM | POA: Insufficient documentation

## 2022-04-20 DIAGNOSIS — D120 Benign neoplasm of cecum: Secondary | ICD-10-CM | POA: Diagnosis not present

## 2022-04-20 DIAGNOSIS — E119 Type 2 diabetes mellitus without complications: Secondary | ICD-10-CM | POA: Diagnosis not present

## 2022-04-20 DIAGNOSIS — G4730 Sleep apnea, unspecified: Secondary | ICD-10-CM | POA: Insufficient documentation

## 2022-04-20 DIAGNOSIS — Z955 Presence of coronary angioplasty implant and graft: Secondary | ICD-10-CM | POA: Diagnosis not present

## 2022-04-20 DIAGNOSIS — E039 Hypothyroidism, unspecified: Secondary | ICD-10-CM | POA: Diagnosis not present

## 2022-04-20 DIAGNOSIS — K635 Polyp of colon: Secondary | ICD-10-CM

## 2022-04-20 DIAGNOSIS — I10 Essential (primary) hypertension: Secondary | ICD-10-CM | POA: Insufficient documentation

## 2022-04-20 HISTORY — PX: POLYPECTOMY: SHX5525

## 2022-04-20 HISTORY — PX: COLONOSCOPY WITH PROPOFOL: SHX5780

## 2022-04-20 LAB — GLUCOSE, CAPILLARY: Glucose-Capillary: 94 mg/dL (ref 70–99)

## 2022-04-20 SURGERY — COLONOSCOPY WITH PROPOFOL
Anesthesia: General

## 2022-04-20 MED ORDER — LIDOCAINE HCL (CARDIAC) PF 100 MG/5ML IV SOSY
PREFILLED_SYRINGE | INTRAVENOUS | Status: DC | PRN
  Administered 2022-04-20: 60 mg via INTRATRACHEAL

## 2022-04-20 MED ORDER — LACTATED RINGERS IV SOLN: INTRAVENOUS | Status: DC | PRN

## 2022-04-20 MED ORDER — PROPOFOL 500 MG/50ML IV EMUL
INTRAVENOUS | Status: DC | PRN
  Administered 2022-04-20: 200 ug/kg/min via INTRAVENOUS

## 2022-04-20 MED ORDER — PHENYLEPHRINE HCL (PRESSORS) 10 MG/ML IV SOLN
INTRAVENOUS | Status: DC | PRN
  Administered 2022-04-20 (×3): 160 ug via INTRAVENOUS

## 2022-04-20 MED ORDER — PROPOFOL 10 MG/ML IV BOLUS
INTRAVENOUS | Status: DC | PRN
  Administered 2022-04-20: 150 mg via INTRAVENOUS

## 2022-04-20 NOTE — Op Note (Addendum)
[facility] Patient Name: Jodi Ray Procedure Date: 04/20/2022 9:30 AM MRN: [id number] Date of Birth: 11/04/1960 Attending MD: Elon Alas. Abbey Chatters DO CSN: [id number] Age: 60 Admit Type: Outpatient Procedure:                Colonoscopy Indications:              Colon cancer screening in patient at increased                            risk: Colorectal cancer in mother Providers:                Elon Alas. Abbey Chatters, DO, Lambert Mody, Angela A.                            Theda Sers RN, RN Referring MD:              Medicines:                See the Anesthesia note for documentation of the                            administered medications Complications:            No immediate complications. Estimated Blood Loss:     Estimated blood loss was minimal. Procedure:                Pre-Anesthesia Assessment:                           - The anesthesia plan was to use monitored                            anesthesia care (MAC).                           After obtaining informed consent, the colonoscope                            was passed under direct vision. Throughout the                            procedure, the patient's blood pressure, pulse, and                            oxygen saturations were monitored continuously. The                            [id number]-[id number] ([id number]) scope was introduced through                            the anus and advanced to the the cecum, identified                            by appendiceal orifice and ileocecal valve. The                            colonoscopy was performed without  difficulty. The                            patient tolerated the procedure well. The quality                            of the bowel preparation was evaluated using the                            BBPS [facility] Bowel Preparation Scale) with scores                            of: Right Colon = 3, Transverse Colon = 3 and Left                            Colon = 3 (entire mucosa  seen well with no residual                            staining, small fragments of stool or opaque                            liquid). The total BBPS score equals 9. Scope In: 9:34:53 AM Scope Out: 9:54:23 AM Scope Withdrawal Time: 0 hours 10 minutes 40 seconds  Total Procedure Duration: 0 hours 19 minutes 30 seconds  Findings:      The perianal and digital rectal examinations were normal.      Non-bleeding internal hemorrhoids were found during endoscopy.      A 3 mm polyp was found in the cecum. The polyp was sessile. The polyp       was removed with a cold snare. Resection and retrieval were complete.      The exam was otherwise without abnormality. Impression:               - Non-bleeding internal hemorrhoids.                           - One 3 mm polyp in the cecum, removed with a cold                            snare. Resected and retrieved.                           - The examination was otherwise normal. Moderate Sedation:      Per Anesthesia Care Recommendation:           - Patient has a contact number available for                            emergencies. The signs and symptoms of potential                            delayed complications were discussed with the                            patient. Return to normal activities tomorrow.  Written discharge instructions were provided to the                            patient.                           - Resume previous diet.                           - Continue present medications.                           - Await pathology results.                           - Repeat colonoscopy in 5 years for surveillance                            and family history of colon cancer                           - Return to GI clinic PRN. Procedure Code(s):        --- Professional ---                           [phone], Colonoscopy, flexible; with removal of                            tumor(s), polyp(s), or other lesion(s) by  snare                            technique Diagnosis Code(s):        --- Professional ---                           Z80.0, Family history of malignant neoplasm of                            digestive organs                           K63.5, Polyp of colon                           K64.8, Other hemorrhoids CPT copyright 2019 American Medical Association. All rights reserved. The codes documented in this report are preliminary and upon coder review may  be revised to meet current compliance requirements. Elon Alas. Abbey Chatters, DO [place] Abbey Chatters, DO 04/20/2022 10:00:34 AM This report has been signed electronically. Number of Addenda: 0

## 2022-04-20 NOTE — Anesthesia Preprocedure Evaluation (Signed)
Anesthesia Evaluation  Patient identified by MRN, date of birth, ID band Patient awake    Reviewed: Allergy & Precautions, H&P , NPO status , Patient's Chart, lab work & pertinent test results, reviewed documented beta blocker date and time   Airway Mallampati: II  TM Distance: >3 FB Neck ROM: full    Dental no notable dental hx.    Pulmonary sleep apnea ,    Pulmonary exam normal breath sounds clear to auscultation       Cardiovascular Exercise Tolerance: Good hypertension, + CAD and + Cardiac Stents   Rhythm:regular Rate:Normal     Neuro/Psych  Headaches,  Neuromuscular disease negative psych ROS   GI/Hepatic negative GI ROS, Neg liver ROS,   Endo/Other  diabetes, Type 2Hypothyroidism   Renal/GU negative Renal ROS  negative genitourinary   Musculoskeletal   Abdominal   Peds  Hematology  (+) Blood dyscrasia, anemia ,   Anesthesia Other Findings   Reproductive/Obstetrics negative OB ROS                             Anesthesia Physical Anesthesia Plan  ASA: 3  Anesthesia Plan: General   Post-op Pain Management:    Induction:   PONV Risk Score and Plan: Propofol infusion  Airway Management Planned:   Additional Equipment:   Intra-op Plan:   Post-operative Plan:   Informed Consent: I have reviewed the patients History and Physical, chart, labs and discussed the procedure including the risks, benefits and alternatives for the proposed anesthesia with the patient or authorized representative who has indicated his/her understanding and acceptance.     Dental Advisory Given  Plan Discussed with: CRNA  Anesthesia Plan Comments:         Anesthesia Quick Evaluation

## 2022-04-20 NOTE — Transfer of Care (Signed)
Immediate Anesthesia Transfer of Care Note  Patient: Jodi Ray  Procedure(s) Performed: COLONOSCOPY WITH PROPOFOL POLYPECTOMY  Patient Location: Short Stay  Anesthesia Type:General  Level of Consciousness: awake, alert  and oriented  Airway & Oxygen Therapy: Patient Spontanous Breathing  Post-op Assessment: Report given to RN and Post -op Vital signs reviewed and stable  Post vital signs: Reviewed and stable  Last Vitals:  Vitals Value Taken Time  BP 115/55   Temp 36   Pulse 65   Resp 16   SpO2 96     Last Pain:  Vitals:   04/20/22 0933  TempSrc:   PainSc: 0-No pain         Complications: No notable events documented.

## 2022-04-20 NOTE — Discharge Instructions (Addendum)
  Colonoscopy Discharge Instructions  Read the instructions outlined below and refer to this sheet in the next few weeks. These discharge instructions provide you with general information on caring for yourself after you leave the hospital. Your doctor may also give you specific instructions. While your treatment has been planned according to the most current medical practices available, unavoidable complications occasionally occur.   ACTIVITY You may resume your regular activity, but move at a slower pace for the next 24 hours.  Take frequent rest periods for the next 24 hours.  Walking will help get rid of the air and reduce the bloated feeling in your belly (abdomen).  No driving for 24 hours (because of the medicine (anesthesia) used during the test).   Do not sign any important legal documents or operate any machinery for 24 hours (because of the anesthesia used during the test).  NUTRITION Drink plenty of fluids.  You may resume your normal diet as instructed by your doctor.  Begin with a light meal and progress to your normal diet. Heavy or fried foods are harder to digest and may make you feel sick to your stomach (nauseated).  Avoid alcoholic beverages for 24 hours or as instructed.  MEDICATIONS You may resume your normal medications unless your doctor tells you otherwise.  WHAT YOU CAN EXPECT TODAY Some feelings of bloating in the abdomen.  Passage of more gas than usual.  Spotting of blood in your stool or on the toilet paper.  IF YOU HAD POLYPS REMOVED DURING THE COLONOSCOPY: No aspirin products for 7 days or as instructed.  No alcohol for 7 days or as instructed.  Eat a soft diet for the next 24 hours.  FINDING OUT THE RESULTS OF YOUR TEST Not all test results are available during your visit. If your test results are not back during the visit, make an appointment with your caregiver to find out the results. Do not assume everything is normal if you have not heard from your  caregiver or the medical facility. It is important for you to follow up on all of your test results.  SEEK IMMEDIATE MEDICAL ATTENTION IF: You have more than a spotting of blood in your stool.  Your belly is swollen (abdominal distention).  You are nauseated or vomiting.  You have a temperature over 101.  You have abdominal pain or discomfort that is severe or gets worse throughout the day.   Your colonoscopy revealed 1 polyp(s) which I removed successfully. Await pathology results, my office will contact you. I recommend repeating colonoscopy in 5 years for surveillance purposes. Otherwise follow up with Gi as needed.   I hope you have a great rest of your week!  Elon Alas. Abbey Chatters, D.O. Gastroenterology and Hepatology [facility]

## 2022-04-20 NOTE — H&P (Signed)
Primary Care Physician:  Monico Blitz, MD Primary Gastroenterologist:  Dr. Abbey Chatters  Pre-Procedure History & Physical: HPI:  Jodi Ray is a 61 y.o. female is here for a colonoscopy to be performed for high risk colon cancer screening purposes/family history of colon cancer in mother.  Past Medical History:  Diagnosis Date   Adult RDS ([place])    Anemia    Brain aneurysm    frontal lobe   CAD (coronary artery disease)    a. 02/2018: s/p DES to Proximal LAD with residual 20% RCA stenosis.   Chronic pain    Diabetes mellitus without complication (HCC)    Headache    History of left bundle branch block (LBBB)    HTN (hypertension)    Hx of cardiovascular stress test 10/2016   intermediate risk study   Hypothyroidism    Neuromuscular disorder ([place])    Neuropathy    Obesity    OSA on CPAP 03/10/2015   Renal insufficiency    Retinopathy    Sleep apnea    Varicose veins of both lower extremities     Past Surgical History:  Procedure Laterality Date   BIOPSY  11/14/2016   Procedure: BIOPSY;  Surgeon: Danie Binder, MD;  Location: [facility];  Service: Endoscopy;;  colon gastric duodenal   BREAST EXCISIONAL BIOPSY Right    BREAST SURGERY Left 1994   Lumpectomy   COLONOSCOPY WITH PROPOFOL N/A 11/14/2016   Dr. Oneida Alar: Moderately redundant rectosigmoid colon. Random colon biopsies benign. Internal hemorrhoids. Surveillance colonoscopy in 5 years.   CORONARY STENT INTERVENTION N/A 03/22/2018   Procedure: CORONARY STENT INTERVENTION;  Surgeon: Burnell Blanks, MD;  Location: [place] [facility];  Service: Cardiovascular;  Laterality: N/A;   ESOPHAGOGASTRODUODENOSCOPY (EGD) WITH PROPOFOL N/A 11/14/2016   Dr. Oneida Alar: Esophagus normal. Moderate gastritis, few gastric polyps. Duodenal biopsies negative. Fundic gland polyp gastric polyp area no H pylori.   FOOT SURGERY     LEFT HEART CATH AND CORONARY ANGIOGRAPHY N/A 03/22/2018   Procedure: LEFT HEART CATH AND CORONARY  ANGIOGRAPHY;  Surgeon: Burnell Blanks, MD;  Location: [place] [facility];  Service: Cardiovascular;  Laterality: N/A;   LEG SURGERY Right    REPLACEMENT TOTAL KNEE Right    SPINAL CORD STIMULATOR IMPLANT      Prior to Admission medications   Medication Sig Start Date End Date Taking? Authorizing Provider  aspirin EC 81 MG tablet Take 1 tablet (81 mg total) by mouth daily. Swallow whole. 07/04/21  Yes Branch, Alphonse Guild, MD  atorvastatin (LIPITOR) 80 MG tablet TAKE ONE TABLET BY MOUTH DAILY AT 6 PM Patient taking differently: Take 80 mg by mouth daily. 08/02/21  Yes BranchAlphonse Guild, MD  calcitRIOL (ROCALTROL) 0.25 MCG capsule Take 0.25 mcg by mouth every other day. 12/01/21  Yes [provider]  carvedilol (COREG) 3.125 MG tablet Take 3.125 mg by mouth 2 (two) times daily with a meal.   Yes [provider]  fish oil-omega-3 fatty acids 1000 MG capsule Take 1 g by mouth in the morning and at bedtime.   Yes [provider]  furosemide (LASIX) 40 MG tablet Take 20-40 mg by mouth 2 (two) times daily. 40 MG AM, 20 MG PM   Yes [provider]  Insulin Human (INSULIN PUMP) SOLN Inject into the skin continuous.    Yes [provider]  JARDIANCE 10 MG TABS tablet Take 5 mg by mouth every other day. 07/15/21  Yes [provider]  KERENDIA 10 MG TABS Take 1 tablet by mouth daily. 02/06/22  Yes [provider]  levothyroxine (SYNTHROID) 112 MCG tablet Take 112 mcg by mouth daily before breakfast.   Yes [provider]  loratadine (CLARITIN) 10 MG tablet Take 2 tablets (20 mg total) by mouth daily. 04/14/22 07/13/22 Yes Valentina Shaggy, MD  metFORMIN (GLUCOPHAGE) 500 MG tablet Take 1 tablet (500 mg total) by mouth 2 (two) times daily with a meal. 11/19/19  Yes Barton Dubois, MD  NOVOLOG 100 UNIT/ML injection Inject into the skin continuous. In insulin pump 02/24/18  Yes [provider]  OZEMPIC, 0.25 OR 0.5 MG/DOSE, 2  MG/3ML SOPN Inject 0.25 mg into the skin once a week. 04/04/22  Yes [provider]  pantoprazole (PROTONIX) 40 MG tablet Take 1 tablet (40 mg total) by mouth daily. 02/21/22  Yes Mahon, Lenise Arena, NP  Continuous Blood Gluc Sensor (DEXCOM G6 SENSOR) MISC 1 Device by Other route as directed.    [provider]  EPINEPHrine 0.3 mg/0.3 mL IJ SOAJ injection Inject 0.3 mg into the muscle as needed for anaphylaxis. 08/03/21   Valentina Shaggy, MD  glucose blood [facility] BLOOD GLUCOSE TEST) test strip     [provider]  nitroGLYCERIN (NITROSTAT) 0.4 MG SL tablet Place 1 tablet (0.4 mg total) under the tongue every 5 (five) minutes as needed for chest pain. 01/26/22   Arnoldo Lenis, MD    Allergies as of 03/21/2022 - Review Complete 02/21/2022  Allergen Reaction Noted   Other Nausea And Vomiting and Other (See Comments) 08/04/2015   Fentanyl Nausea And Vomiting and Other (See Comments) 11/09/2016    Family History  Problem Relation Age of Onset   Cancer Mother 74       colon   Cancer Father        renal cell   Colon cancer Father    Breast cancer Sister    Cancer Sister        primary brain, and primary breast too   Breast cancer Sister    Cancer Sister        breast    Breast cancer Sister    Breast cancer Sister    Cancer Brother        kidney and liver   Sleep apnea Neg Hx     Social History   Socioeconomic History   Marital status: Widowed    Spouse name: Not on file   Number of children: 0   Years of education: Not on file   Highest education level: Bachelor's degree (e.g., BA, AB, BS)  Occupational History   Occupation: Retired  Tobacco Use   Smoking status: Never   Smokeless tobacco: Never  Vaping Use   Vaping Use: Never used  Substance and Sexual Activity   Alcohol use: No    Alcohol/week: 0.0 standard drinks of alcohol   Drug use: No   Sexual activity: Not Currently    Partners: Male  Other Topics Concern   Not on file  Social  History Narrative   Drinks about 1 cup coffee day.   Cardiac rehab 3x week   One level home with significant other   Bachelors in Accounting      Right handed   No children   widow   Social Determinants of Health   Financial Resource Strain: Not on file  Food Insecurity: Not on file  Transportation Needs: Not on file  Physical Activity: Not on file  Stress:  Not on file  Social Connections: Not on file  Intimate Partner Violence: Not on file    Review of Systems: See HPI, otherwise negative ROS  Physical Exam: Vital signs in last 24 hours: Temp:  [97.8 F (36.6 C)] 97.8 F (36.6 C) (09/21 0828) Pulse Rate:  [83] 83 (09/21 0828) Resp:  [11] 11 (09/21 0828) BP: (143)/(68) 143/68 (09/21 0828) SpO2:  [99 %] 99 % (09/21 0828) Weight:  [114.1 kg] 114.1 kg (09/21 0828)   General:   Alert,  Well-developed, well-nourished, pleasant and cooperative in NAD Head:  Normocephalic and atraumatic. Eyes:  Sclera clear, no icterus.   Conjunctiva pink. Ears:  Normal auditory acuity. Nose:  No deformity, discharge,  or lesions. Mouth:  No deformity or lesions, dentition normal. Neck:  Supple; no masses or thyromegaly. Lungs:  Clear throughout to auscultation.   No wheezes, crackles, or rhonchi. No acute distress. Heart:  Regular rate and rhythm; no murmurs, clicks, rubs,  or gallops. Abdomen:  Soft, nontender and nondistended. No masses, hepatosplenomegaly or hernias noted. Normal bowel sounds, without guarding, and without rebound.   Msk:  Symmetrical without gross deformities. Normal posture. Extremities:  Without clubbing or edema. Neurologic:  Alert and  oriented x4;  grossly normal neurologically. Skin:  Intact without significant lesions or rashes. Cervical Nodes:  No significant cervical adenopathy. Psych:  Alert and cooperative. Normal mood and affect.  Impression/Plan: JAKERRA FLOYD is here for a colonoscopy to be performed for high risk colon cancer screening  purposes/family history of colon cancer in mother.  The risks of the procedure including infection, bleed, or perforation as well as benefits, limitations, alternatives and imponderables have been reviewed with the patient. Questions have been answered. All parties agreeable.

## 2022-04-20 NOTE — Anesthesia Postprocedure Evaluation (Signed)
Anesthesia Post Note  Patient: Jodi Ray  Procedure(s) Performed: COLONOSCOPY WITH PROPOFOL POLYPECTOMY  Patient location during evaluation: Phase II Anesthesia Type: General Level of consciousness: awake Pain management: pain level controlled Vital Signs Assessment: post-procedure vital signs reviewed and stable Respiratory status: spontaneous breathing and respiratory function stable Cardiovascular status: blood pressure returned to baseline and stable Postop Assessment: no headache and no apparent nausea or vomiting Anesthetic complications: no Comments: Late entry   No notable events documented.   Last Vitals:  Vitals:   04/20/22 0828 04/20/22 0958  BP: (!) 143/68 (!) 104/58  Pulse: 83 77  Resp: 11 15  Temp: 36.6 C 36.5 C  SpO2: 99% 96%    Last Pain:  Vitals:   04/20/22 0958  TempSrc: Oral  PainSc: 0-No pain                 Louann Sjogren

## 2022-04-21 ENCOUNTER — Ambulatory Visit (INDEPENDENT_AMBULATORY_CARE_PROVIDER_SITE_OTHER): Payer: Medicare Other

## 2022-04-21 DIAGNOSIS — J309 Allergic rhinitis, unspecified: Secondary | ICD-10-CM | POA: Diagnosis not present

## 2022-04-21 LAB — SURGICAL PATHOLOGY

## 2022-04-24 ENCOUNTER — Other Ambulatory Visit (HOSPITAL_COMMUNITY)
Admission: RE | Admit: 2022-04-24 | Discharge: 2022-04-24 | Disposition: A | Discharge status: Home / Self Care | Payer: Medicare Other | Source: Ambulatory Visit | Attending: Nephrology | Admitting: Nephrology

## 2022-04-24 DIAGNOSIS — N170 Acute kidney failure with tubular necrosis: Secondary | ICD-10-CM | POA: Diagnosis not present

## 2022-04-24 DIAGNOSIS — E1122 Type 2 diabetes mellitus with diabetic chronic kidney disease: Secondary | ICD-10-CM | POA: Insufficient documentation

## 2022-04-24 DIAGNOSIS — I129 Hypertensive chronic kidney disease with stage 1 through stage 4 chronic kidney disease, or unspecified chronic kidney disease: Secondary | ICD-10-CM | POA: Insufficient documentation

## 2022-04-24 DIAGNOSIS — N189 Chronic kidney disease, unspecified: Secondary | ICD-10-CM | POA: Insufficient documentation

## 2022-04-24 DIAGNOSIS — I5032 Chronic diastolic (congestive) heart failure: Secondary | ICD-10-CM | POA: Insufficient documentation

## 2022-04-24 LAB — CBC WITH DIFFERENTIAL/PLATELET
Abs Immature Granulocytes: 0.01 10*3/uL (ref 0.00–0.07)
Basophils Absolute: 0 10*3/uL (ref 0.0–0.1)
Basophils Relative: 1 %
Eosinophils Absolute: 0.3 10*3/uL (ref 0.0–0.5)
Eosinophils Relative: 6 %
HCT: 37 % (ref 36.0–46.0)
Hemoglobin: 11.8 g/dL — ABNORMAL LOW (ref 12.0–15.0)
Immature Granulocytes: 0 %
Lymphocytes Relative: 31 %
Lymphs Abs: 1.6 10*3/uL (ref 0.7–4.0)
MCH: 29.3 pg (ref 26.0–34.0)
MCHC: 31.9 g/dL (ref 30.0–36.0)
MCV: 91.8 fL (ref 80.0–100.0)
Monocytes Absolute: 0.5 10*3/uL (ref 0.1–1.0)
Monocytes Relative: 9 %
Neutro Abs: 2.6 10*3/uL (ref 1.7–7.7)
Neutrophils Relative %: 53 %
Platelets: 225 10*3/uL (ref 150–400)
RBC: 4.03 MIL/uL (ref 3.87–5.11)
RDW: 14.6 % (ref 11.5–15.5)
WBC: 5 10*3/uL (ref 4.0–10.5)
nRBC: 0 % (ref 0.0–0.2)

## 2022-04-24 LAB — RENAL FUNCTION PANEL
Albumin: 3.8 g/dL (ref 3.5–5.0)
Anion gap: 8 (ref 5–15)
BUN: 27 mg/dL — ABNORMAL HIGH (ref 8–23)
CO2: 32 mmol/L (ref 22–32)
Calcium: 9.2 mg/dL (ref 8.9–10.3)
Chloride: 101 mmol/L (ref 98–111)
Creatinine, Ser: 1.29 mg/dL — ABNORMAL HIGH (ref 0.44–1.00)
GFR, Estimated: 47 mL/min — ABNORMAL LOW (ref >60)
Glucose, Bld: 84 mg/dL (ref 70–99)
Phosphorus: 3.4 mg/dL (ref 2.5–4.6)
Potassium: 3.9 mmol/L (ref 3.5–5.1)
Sodium: 141 mmol/L (ref 135–145)

## 2022-04-24 LAB — PROTEIN / CREATININE RATIO, URINE
Creatinine, Urine: 25.52 mg/dL
Total Protein, Urine: <6 mg/dL

## 2022-04-25 DIAGNOSIS — E1151 Type 2 diabetes mellitus with diabetic peripheral angiopathy without gangrene: Secondary | ICD-10-CM | POA: Diagnosis not present

## 2022-04-25 DIAGNOSIS — E1140 Type 2 diabetes mellitus with diabetic neuropathy, unspecified: Secondary | ICD-10-CM | POA: Diagnosis not present

## 2022-04-27 ENCOUNTER — Encounter (HOSPITAL_COMMUNITY): Payer: Self-pay | Admitting: Internal Medicine

## 2022-04-27 DIAGNOSIS — N170 Acute kidney failure with tubular necrosis: Secondary | ICD-10-CM | POA: Diagnosis not present

## 2022-04-27 DIAGNOSIS — N189 Chronic kidney disease, unspecified: Secondary | ICD-10-CM | POA: Diagnosis not present

## 2022-04-27 DIAGNOSIS — I129 Hypertensive chronic kidney disease with stage 1 through stage 4 chronic kidney disease, or unspecified chronic kidney disease: Secondary | ICD-10-CM | POA: Diagnosis not present

## 2022-04-27 DIAGNOSIS — R809 Proteinuria, unspecified: Secondary | ICD-10-CM | POA: Diagnosis not present

## 2022-04-27 DIAGNOSIS — E1122 Type 2 diabetes mellitus with diabetic chronic kidney disease: Secondary | ICD-10-CM | POA: Diagnosis not present

## 2022-04-27 DIAGNOSIS — I5032 Chronic diastolic (congestive) heart failure: Secondary | ICD-10-CM | POA: Diagnosis not present

## 2022-04-27 DIAGNOSIS — D638 Anemia in other chronic diseases classified elsewhere: Secondary | ICD-10-CM | POA: Diagnosis not present

## 2022-04-27 DIAGNOSIS — E1129 Type 2 diabetes mellitus with other diabetic kidney complication: Secondary | ICD-10-CM | POA: Diagnosis not present

## 2022-04-28 ENCOUNTER — Ambulatory Visit (INDEPENDENT_AMBULATORY_CARE_PROVIDER_SITE_OTHER): Payer: Medicare Other

## 2022-04-28 DIAGNOSIS — J309 Allergic rhinitis, unspecified: Secondary | ICD-10-CM

## 2022-05-10 ENCOUNTER — Ambulatory Visit (INDEPENDENT_AMBULATORY_CARE_PROVIDER_SITE_OTHER): Payer: Medicare Other | Admitting: *Deleted

## 2022-05-10 DIAGNOSIS — J309 Allergic rhinitis, unspecified: Secondary | ICD-10-CM | POA: Diagnosis not present

## 2022-05-17 ENCOUNTER — Ambulatory Visit (INDEPENDENT_AMBULATORY_CARE_PROVIDER_SITE_OTHER): Payer: Medicare Other

## 2022-05-17 DIAGNOSIS — J309 Allergic rhinitis, unspecified: Secondary | ICD-10-CM | POA: Diagnosis not present

## 2022-05-24 ENCOUNTER — Ambulatory Visit (INDEPENDENT_AMBULATORY_CARE_PROVIDER_SITE_OTHER): Payer: Medicare Other

## 2022-05-24 DIAGNOSIS — J309 Allergic rhinitis, unspecified: Secondary | ICD-10-CM

## 2022-05-31 ENCOUNTER — Ambulatory Visit (INDEPENDENT_AMBULATORY_CARE_PROVIDER_SITE_OTHER): Payer: Medicare Other | Admitting: *Deleted

## 2022-05-31 DIAGNOSIS — J309 Allergic rhinitis, unspecified: Secondary | ICD-10-CM

## 2022-06-06 DIAGNOSIS — J3081 Allergic rhinitis due to animal (cat) (dog) hair and dander: Secondary | ICD-10-CM | POA: Diagnosis not present

## 2022-06-06 NOTE — Progress Notes (Signed)
VIALS EXP 06-07-23

## 2022-06-07 ENCOUNTER — Ambulatory Visit (INDEPENDENT_AMBULATORY_CARE_PROVIDER_SITE_OTHER): Payer: Medicare Other

## 2022-06-07 DIAGNOSIS — J309 Allergic rhinitis, unspecified: Secondary | ICD-10-CM | POA: Diagnosis not present

## 2022-06-08 DIAGNOSIS — J3089 Other allergic rhinitis: Secondary | ICD-10-CM | POA: Diagnosis not present

## 2022-06-14 ENCOUNTER — Ambulatory Visit (INDEPENDENT_AMBULATORY_CARE_PROVIDER_SITE_OTHER): Payer: Medicare Other

## 2022-06-14 DIAGNOSIS — J309 Allergic rhinitis, unspecified: Secondary | ICD-10-CM | POA: Diagnosis not present

## 2022-06-21 ENCOUNTER — Ambulatory Visit (INDEPENDENT_AMBULATORY_CARE_PROVIDER_SITE_OTHER): Payer: Medicare Other

## 2022-06-21 DIAGNOSIS — J309 Allergic rhinitis, unspecified: Secondary | ICD-10-CM

## 2022-06-21 DIAGNOSIS — H6123 Impacted cerumen, bilateral: Secondary | ICD-10-CM | POA: Diagnosis not present

## 2022-06-26 ENCOUNTER — Telehealth: Payer: Self-pay | Admitting: Cardiology

## 2022-06-26 NOTE — Telephone Encounter (Signed)
Patient notified and verbalized understanding. Pt had no questions or concerns at this time 

## 2022-06-26 NOTE — Telephone Encounter (Signed)
Pt c/o BP issue: STAT if pt c/o blurred vision, one-sided weakness or slurred speech  1. What are your last 5 BP readings?   Before medication, states she is not on bp medication anymore  This morning: 156/59  Yesterday:  168/57 164/52 170/63 167/60   2. Are you having any other symptoms (ex. Dizziness, headache, blurred vision, passed out)? Dizziness, blurred vision   3. What is your BP issue?  Pt states she her dizziness has gotten worse. She said it occurs even when she is in the bed. Pt states her vision has also been blurry and "wonky"

## 2022-06-26 NOTE — Telephone Encounter (Signed)
Spoke to pt who stated that since before thanksgiving, she has been experiencing dizziness and blurred vision. Pt stated her dizziness is bad when she is standing but is worse when she lies down. Pt stated that she is currently not taking any bp medication.  Please advise.

## 2022-06-26 NOTE — Telephone Encounter (Signed)
I would touch base with her pcp. Her bps are high and symptoms are worst with laying down which would argue with the bp being an issue. Recent heart monitor was benign. Could be another potential cause for her dizziness, really stands out that symptoms are worst with laying down and associated blurry visition which are not common when the symptoms are cardiac  J Kassius Battiste MD

## 2022-06-27 DIAGNOSIS — Z2821 Immunization not carried out because of patient refusal: Secondary | ICD-10-CM | POA: Diagnosis not present

## 2022-06-27 DIAGNOSIS — E1165 Type 2 diabetes mellitus with hyperglycemia: Secondary | ICD-10-CM | POA: Diagnosis not present

## 2022-06-27 DIAGNOSIS — N183 Chronic kidney disease, stage 3 unspecified: Secondary | ICD-10-CM | POA: Diagnosis not present

## 2022-06-27 DIAGNOSIS — E1122 Type 2 diabetes mellitus with diabetic chronic kidney disease: Secondary | ICD-10-CM | POA: Diagnosis not present

## 2022-06-27 DIAGNOSIS — I10 Essential (primary) hypertension: Secondary | ICD-10-CM | POA: Diagnosis not present

## 2022-06-27 DIAGNOSIS — Z299 Encounter for prophylactic measures, unspecified: Secondary | ICD-10-CM | POA: Diagnosis not present

## 2022-06-28 ENCOUNTER — Other Ambulatory Visit (HOSPITAL_COMMUNITY)
Admission: RE | Admit: 2022-06-28 | Discharge: 2022-06-28 | Disposition: A | Discharge status: Home / Self Care | Payer: Medicare Other | Source: Ambulatory Visit | Attending: Nephrology | Admitting: Nephrology

## 2022-06-28 ENCOUNTER — Other Ambulatory Visit (HOSPITAL_COMMUNITY)
Admission: RE | Admit: 2022-06-28 | Discharge: 2022-06-28 | Disposition: A | Discharge status: Home / Self Care | Payer: Medicare Other | Source: Ambulatory Visit | Attending: Endocrinology | Admitting: Endocrinology

## 2022-06-28 ENCOUNTER — Ambulatory Visit (INDEPENDENT_AMBULATORY_CARE_PROVIDER_SITE_OTHER): Payer: Medicare Other

## 2022-06-28 DIAGNOSIS — N189 Chronic kidney disease, unspecified: Secondary | ICD-10-CM | POA: Diagnosis not present

## 2022-06-28 DIAGNOSIS — E1122 Type 2 diabetes mellitus with diabetic chronic kidney disease: Secondary | ICD-10-CM | POA: Insufficient documentation

## 2022-06-28 DIAGNOSIS — I130 Hypertensive heart and chronic kidney disease with heart failure and stage 1 through stage 4 chronic kidney disease, or unspecified chronic kidney disease: Secondary | ICD-10-CM | POA: Diagnosis not present

## 2022-06-28 DIAGNOSIS — N170 Acute kidney failure with tubular necrosis: Secondary | ICD-10-CM | POA: Diagnosis not present

## 2022-06-28 DIAGNOSIS — E109 Type 1 diabetes mellitus without complications: Secondary | ICD-10-CM | POA: Insufficient documentation

## 2022-06-28 DIAGNOSIS — J309 Allergic rhinitis, unspecified: Secondary | ICD-10-CM

## 2022-06-28 LAB — CBC
HCT: 37.6 % (ref 36.0–46.0)
Hemoglobin: 12.1 g/dL (ref 12.0–15.0)
MCH: 29.1 pg (ref 26.0–34.0)
MCHC: 32.2 g/dL (ref 30.0–36.0)
MCV: 90.4 fL (ref 80.0–100.0)
Platelets: 239 10*3/uL (ref 150–400)
RBC: 4.16 MIL/uL (ref 3.87–5.11)
RDW: 14 % (ref 11.5–15.5)
WBC: 5.4 10*3/uL (ref 4.0–10.5)
nRBC: 0 % (ref 0.0–0.2)

## 2022-06-28 LAB — RENAL FUNCTION PANEL
Albumin: 3.8 g/dL (ref 3.5–5.0)
Anion gap: 13 (ref 5–15)
BUN: 41 mg/dL — ABNORMAL HIGH (ref 8–23)
CO2: 27 mmol/L (ref 22–32)
Calcium: 9.2 mg/dL (ref 8.9–10.3)
Chloride: 99 mmol/L (ref 98–111)
Creatinine, Ser: 1.25 mg/dL — ABNORMAL HIGH (ref 0.44–1.00)
GFR, Estimated: 48 mL/min — ABNORMAL LOW (ref >60)
Glucose, Bld: 113 mg/dL — ABNORMAL HIGH (ref 70–99)
Phosphorus: 3.6 mg/dL (ref 2.5–4.6)
Potassium: 4 mmol/L (ref 3.5–5.1)
Sodium: 139 mmol/L (ref 135–145)

## 2022-06-28 LAB — COMPREHENSIVE METABOLIC PANEL
ALT: 18 U/L (ref 0–44)
AST: 21 U/L (ref 15–41)
Albumin: 3.7 g/dL (ref 3.5–5.0)
Alkaline Phosphatase: 101 U/L (ref 38–126)
Anion gap: 12 (ref 5–15)
BUN: 41 mg/dL — ABNORMAL HIGH (ref 8–23)
CO2: 28 mmol/L (ref 22–32)
Calcium: 9.4 mg/dL (ref 8.9–10.3)
Chloride: 100 mmol/L (ref 98–111)
Creatinine, Ser: 1.14 mg/dL — ABNORMAL HIGH (ref 0.44–1.00)
GFR, Estimated: 54 mL/min — ABNORMAL LOW (ref >60)
Glucose, Bld: 115 mg/dL — ABNORMAL HIGH (ref 70–99)
Potassium: 4 mmol/L (ref 3.5–5.1)
Sodium: 140 mmol/L (ref 135–145)
Total Bilirubin: 0.4 mg/dL (ref 0.3–1.2)
Total Protein: 7.7 g/dL (ref 6.5–8.1)

## 2022-06-28 LAB — TSH: TSH: 1.52 u[IU]/mL (ref 0.350–4.500)

## 2022-06-28 LAB — PROTEIN / CREATININE RATIO, URINE
Creatinine, Urine: 68.03 mg/dL
Protein Creatinine Ratio: 0.28 mg/mg{Cre} — ABNORMAL HIGH (ref 0.00–0.15)
Total Protein, Urine: 19 mg/dL

## 2022-06-28 NOTE — Progress Notes (Signed)
Spoke with patient, she stated that she has a large red area, bigger than a half dollar. Patient was instructed to take benadryl and use cold compresses. Patient is trying to take a picture to show [place] but isn't have any luck. Patient is going to increase her antihistamines for the next day or two.

## 2022-06-29 LAB — PTH, INTACT AND CALCIUM
Calcium, Total (PTH): 9.4 mg/dL (ref 8.7–10.3)
PTH: 91 pg/mL — ABNORMAL HIGH (ref 15–65)

## 2022-06-29 LAB — HEMOGLOBIN A1C
Hgb A1c MFr Bld: 6.3 % — ABNORMAL HIGH (ref 4.8–5.6)
Mean Plasma Glucose: 134 mg/dL

## 2022-07-04 DIAGNOSIS — E1140 Type 2 diabetes mellitus with diabetic neuropathy, unspecified: Secondary | ICD-10-CM | POA: Diagnosis not present

## 2022-07-04 DIAGNOSIS — E1151 Type 2 diabetes mellitus with diabetic peripheral angiopathy without gangrene: Secondary | ICD-10-CM | POA: Diagnosis not present

## 2022-07-05 ENCOUNTER — Ambulatory Visit (INDEPENDENT_AMBULATORY_CARE_PROVIDER_SITE_OTHER): Payer: Medicare Other

## 2022-07-05 DIAGNOSIS — J309 Allergic rhinitis, unspecified: Secondary | ICD-10-CM | POA: Diagnosis not present

## 2022-07-06 DIAGNOSIS — Z9641 Presence of insulin pump (external) (internal): Secondary | ICD-10-CM | POA: Diagnosis not present

## 2022-07-06 DIAGNOSIS — E109 Type 1 diabetes mellitus without complications: Secondary | ICD-10-CM | POA: Diagnosis not present

## 2022-07-06 DIAGNOSIS — E1140 Type 2 diabetes mellitus with diabetic neuropathy, unspecified: Secondary | ICD-10-CM | POA: Diagnosis not present

## 2022-07-06 DIAGNOSIS — E039 Hypothyroidism, unspecified: Secondary | ICD-10-CM | POA: Diagnosis not present

## 2022-07-06 DIAGNOSIS — E785 Hyperlipidemia, unspecified: Secondary | ICD-10-CM | POA: Diagnosis not present

## 2022-07-06 DIAGNOSIS — Z794 Long term (current) use of insulin: Secondary | ICD-10-CM | POA: Diagnosis not present

## 2022-07-06 DIAGNOSIS — E11319 Type 2 diabetes mellitus with unspecified diabetic retinopathy without macular edema: Secondary | ICD-10-CM | POA: Diagnosis not present

## 2022-07-06 DIAGNOSIS — M1460 Charcot's joint, unspecified site: Secondary | ICD-10-CM | POA: Diagnosis not present

## 2022-07-06 DIAGNOSIS — E669 Obesity, unspecified: Secondary | ICD-10-CM | POA: Diagnosis not present

## 2022-07-06 DIAGNOSIS — I10 Essential (primary) hypertension: Secondary | ICD-10-CM | POA: Diagnosis not present

## 2022-07-06 DIAGNOSIS — I2510 Atherosclerotic heart disease of native coronary artery without angina pectoris: Secondary | ICD-10-CM | POA: Diagnosis not present

## 2022-07-06 DIAGNOSIS — E118 Type 2 diabetes mellitus with unspecified complications: Secondary | ICD-10-CM | POA: Diagnosis not present

## 2022-07-10 ENCOUNTER — Telehealth: Payer: Self-pay | Admitting: Cardiology

## 2022-07-10 DIAGNOSIS — I5032 Chronic diastolic (congestive) heart failure: Secondary | ICD-10-CM | POA: Diagnosis not present

## 2022-07-10 DIAGNOSIS — R809 Proteinuria, unspecified: Secondary | ICD-10-CM | POA: Diagnosis not present

## 2022-07-10 DIAGNOSIS — N189 Chronic kidney disease, unspecified: Secondary | ICD-10-CM | POA: Diagnosis not present

## 2022-07-10 DIAGNOSIS — R42 Dizziness and giddiness: Secondary | ICD-10-CM | POA: Diagnosis not present

## 2022-07-10 DIAGNOSIS — E211 Secondary hyperparathyroidism, not elsewhere classified: Secondary | ICD-10-CM | POA: Diagnosis not present

## 2022-07-10 DIAGNOSIS — E1122 Type 2 diabetes mellitus with diabetic chronic kidney disease: Secondary | ICD-10-CM | POA: Diagnosis not present

## 2022-07-10 DIAGNOSIS — I129 Hypertensive chronic kidney disease with stage 1 through stage 4 chronic kidney disease, or unspecified chronic kidney disease: Secondary | ICD-10-CM | POA: Diagnosis not present

## 2022-07-10 DIAGNOSIS — E1129 Type 2 diabetes mellitus with other diabetic kidney complication: Secondary | ICD-10-CM | POA: Diagnosis not present

## 2022-07-10 NOTE — Telephone Encounter (Signed)
Pt c/o medication issue:  1. Name of Medication:  Carvedilol  2. How are you currently taking this medication (dosage and times per day)?   3. Are you having a reaction (difficulty breathing--STAT)?   4. What is your medication issue?   Patient is requesting to speak with Dr. Nelly Laurence nurse. She would like to discuss her BP and determine whether or not she needs to go back on Carvedilol.

## 2022-07-10 NOTE — Telephone Encounter (Signed)
Spoke with patient in regards to her BP readings & dizziness.  No c/o SOB or CP.  Does c/o dizziness with rolling over in the bed and with up walking around.  States she has been to see pcp, thinks she may have had a small stroke - has CT scan scheduled for tomorrow at [facility].  Is currently off of the Coreg.  Has been to her nephrologist and ENT Melene Plan).  ENT did not find anything to cause the dizziness.    12/7 6:30 am - 92/57, later that day at endocrinology office [facility]) - 130/59 12/8 7:00 am - 101/59  12/9 - 118/52 12/10 - 112/53 This am - 92/57, later same day at [place] office - 170//80 After she got home 9:40 am - 119/58  Does already has OV scheduled with Branch for 07/27/2022.

## 2022-07-11 DIAGNOSIS — I6782 Cerebral ischemia: Secondary | ICD-10-CM | POA: Diagnosis not present

## 2022-07-11 DIAGNOSIS — R42 Dizziness and giddiness: Secondary | ICD-10-CM | POA: Diagnosis not present

## 2022-07-11 DIAGNOSIS — G319 Degenerative disease of nervous system, unspecified: Secondary | ICD-10-CM | POA: Diagnosis not present

## 2022-07-12 ENCOUNTER — Ambulatory Visit (INDEPENDENT_AMBULATORY_CARE_PROVIDER_SITE_OTHER): Payer: Medicare Other

## 2022-07-12 DIAGNOSIS — J309 Allergic rhinitis, unspecified: Secondary | ICD-10-CM

## 2022-07-12 NOTE — Telephone Encounter (Signed)
Will see what CT shows and address at our upcoming f/u.   Zandra Abts MD

## 2022-07-13 DIAGNOSIS — Z7189 Other specified counseling: Secondary | ICD-10-CM | POA: Diagnosis not present

## 2022-07-13 DIAGNOSIS — Z299 Encounter for prophylactic measures, unspecified: Secondary | ICD-10-CM | POA: Diagnosis not present

## 2022-07-13 DIAGNOSIS — Z66 Do not resuscitate: Secondary | ICD-10-CM | POA: Diagnosis not present

## 2022-07-13 DIAGNOSIS — Z0000 Encounter for general adult medical examination without abnormal findings: Secondary | ICD-10-CM | POA: Diagnosis not present

## 2022-07-13 DIAGNOSIS — E109 Type 1 diabetes mellitus without complications: Secondary | ICD-10-CM | POA: Diagnosis not present

## 2022-07-13 DIAGNOSIS — E108 Type 1 diabetes mellitus with unspecified complications: Secondary | ICD-10-CM | POA: Diagnosis not present

## 2022-07-13 DIAGNOSIS — Z1331 Encounter for screening for depression: Secondary | ICD-10-CM | POA: Diagnosis not present

## 2022-07-13 DIAGNOSIS — Z1339 Encounter for screening examination for other mental health and behavioral disorders: Secondary | ICD-10-CM | POA: Diagnosis not present

## 2022-07-13 DIAGNOSIS — Z6837 Body mass index (BMI) 37.0-37.9, adult: Secondary | ICD-10-CM | POA: Diagnosis not present

## 2022-07-13 NOTE — Telephone Encounter (Signed)
Patient notified and verbalized understanding. 

## 2022-07-18 ENCOUNTER — Encounter: Payer: Self-pay | Admitting: Neurology

## 2022-07-18 DIAGNOSIS — H2513 Age-related nuclear cataract, bilateral: Secondary | ICD-10-CM | POA: Diagnosis not present

## 2022-07-18 DIAGNOSIS — H25013 Cortical age-related cataract, bilateral: Secondary | ICD-10-CM | POA: Diagnosis not present

## 2022-07-18 DIAGNOSIS — H25041 Posterior subcapsular polar age-related cataract, right eye: Secondary | ICD-10-CM | POA: Diagnosis not present

## 2022-07-18 DIAGNOSIS — H35361 Drusen (degenerative) of macula, right eye: Secondary | ICD-10-CM | POA: Diagnosis not present

## 2022-07-18 DIAGNOSIS — E113313 Type 2 diabetes mellitus with moderate nonproliferative diabetic retinopathy with macular edema, bilateral: Secondary | ICD-10-CM | POA: Diagnosis not present

## 2022-07-19 ENCOUNTER — Ambulatory Visit (INDEPENDENT_AMBULATORY_CARE_PROVIDER_SITE_OTHER): Payer: Medicare Other

## 2022-07-19 DIAGNOSIS — J309 Allergic rhinitis, unspecified: Secondary | ICD-10-CM | POA: Diagnosis not present

## 2022-07-19 NOTE — Progress Notes (Signed)
[facility] Note  08/01/2022     CHIEF COMPLAINT Patient presents for Retina Evaluation   HISTORY OF PRESENT ILLNESS: Jodi Ray is a 61 y.o. female who presents to the clinic today for:   HPI     Retina Evaluation   In both eyes.  This started 1 week ago.  Duration of 1 week.  I, the attending physician,  performed the HPI with the patient and updated documentation appropriately.        Comments   Retina eval per Dr Mare Ferrari for fluid in her eye pt states she has been having more blurred vision in her right eye she has been having some dizzy spells since October 2023 she denies flashes or floaters she had injections with Dr. Valetta Close 3-4 years ago unsure what they was for       Last edited by Bernarda Caffey, MD on 08/02/2022 12:35 PM.    Pt is here on the referral of Dr. Mare Ferrari for concern of DME OU, pt has a hx of injections (x7 OS) and PRP with Dr. Valetta Close about 3 years ago, pt states she left his office and did not see any eye drs until last year, pt states in October she started getting dizzy and made an appt with her neurologist, but could not get in until the end of December, pt had an appt with Dr. Mare Ferrari last month and was told she had "water" in the back of her eye  Referring physician: Monna Fam, MD [place],  [place] [id number]  HISTORICAL INFORMATION:   Selected notes from the MEDICAL RECORD NUMBER Referred by Dr. Mare Ferrari for concern of DME LEE:  Ocular Hx- PMH-    CURRENT MEDICATIONS: No current outpatient medications on file. (Ophthalmic Drugs)   No current facility-administered medications for this visit. (Ophthalmic Drugs)   Current Outpatient Medications (Other)  Medication Sig   aspirin EC 81 MG tablet Take 1 tablet (81 mg total) by mouth daily. Swallow whole.   atorvastatin (LIPITOR) 80 MG tablet TAKE ONE TABLET BY MOUTH DAILY AT 6 PM (Patient taking differently: Take 80 mg by mouth daily.)    calcitRIOL (ROCALTROL) 0.25 MCG capsule Take 0.25 mcg by mouth every other day.   Continuous Blood Gluc Sensor (DEXCOM G6 SENSOR) MISC 1 Device by Other route as directed.   EPINEPHrine 0.3 mg/0.3 mL IJ SOAJ injection Inject 0.3 mg into the muscle as needed for anaphylaxis.   fish oil-omega-3 fatty acids 1000 MG capsule Take 1 g by mouth in the morning and at bedtime.   furosemide (LASIX) 20 MG tablet Take 20 mg by mouth. Take 2 tablets in the morning and 1 tablet in the evening   furosemide (LASIX) 40 MG tablet Take 20-40 mg by mouth 2 (two) times daily. 40 MG AM, 20 MG PM   Glucagon (GVOKE HYPOPEN 2-PACK) 1 MG/0.2ML SOAJ    glucose blood ([employer] BLOOD GLUCOSE TEST) test strip    Insulin Human (INSULIN PUMP) SOLN Inject into the skin continuous.    Insulin Pen Needle (PEN NEEDLES 31GX5/16") 31G X 8 MM MISC as directed subcutaneous Twice a day for 30 days   JARDIANCE 10 MG TABS tablet Take 5 mg by mouth every other day.   KERENDIA 10 MG TABS Take 1 tablet by mouth daily.   levothyroxine (SYNTHROID) 112 MCG tablet Take 112 mcg by mouth daily before breakfast.   loratadine (CLARITIN) 10 MG tablet Take 2 tablets (20  mg total) by mouth daily.   metFORMIN (GLUCOPHAGE) 500 MG tablet Take 1 tablet (500 mg total) by mouth 2 (two) times daily with a meal.   nitroGLYCERIN (NITROSTAT) 0.4 MG SL tablet Place 1 tablet (0.4 mg total) under the tongue every 5 (five) minutes as needed for chest pain.   NOVOLOG 100 UNIT/ML injection Inject into the skin continuous. In insulin pump   pantoprazole (PROTONIX) 40 MG tablet Take 1 tablet (40 mg total) by mouth daily.   No current facility-administered medications for this visit. (Other)   REVIEW OF SYSTEMS: ROS   Positive for: Endocrine, Cardiovascular, Eyes Last edited by Bernarda Caffey, MD on 08/01/2022 12:50 PM.     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 02/2018: s/p DES to Proximal LAD with residual 20% RCA stenosis.   Chronic pain    Diabetes mellitus without complication (HCC)    Headache    History of left bundle branch block (LBBB)    HTN (hypertension)    Hx of cardiovascular stress test 10/2016   intermediate risk study   Hypothyroidism    Neuromuscular disorder ([place])    Neuropathy    Obesity    OSA on CPAP 03/10/2015   Renal insufficiency    Retinopathy    Sleep apnea    Varicose veins of both lower extremities    Past Surgical History:  Procedure Laterality Date   BIOPSY  11/14/2016   Procedure: BIOPSY;  Surgeon: Danie Binder, MD;  [facility];  Service: Endoscopy;;  colon gastric duodenal   BREAST EXCISIONAL BIOPSY Right    BREAST SURGERY Left 1994   Lumpectomy   COLONOSCOPY WITH PROPOFOL N/A 11/14/2016   Dr. Oneida Alar: Moderately redundant rectosigmoid colon. Random colon biopsies benign. Internal hemorrhoids. Surveillance colonoscopy in 5 years.   COLONOSCOPY WITH PROPOFOL N/A 04/20/2022   Procedure: COLONOSCOPY WITH PROPOFOL;  Surgeon: Eloise Harman, DO;  [facility]: [facility];  Service: Endoscopy;  Laterality: N/A;  12:30am   CORONARY STENT INTERVENTION N/A 03/22/2018   Procedure: CORONARY STENT INTERVENTION;  Surgeon: Burnell Blanks, MD;  Location: [facility];  Service: Cardiovascular;  Laterality: N/A;   ESOPHAGOGASTRODUODENOSCOPY (EGD) WITH PROPOFOL N/A 11/14/2016   Dr. Oneida Alar: Esophagus normal. Moderate gastritis, few gastric polyps. Duodenal biopsies negative. Fundic gland polyp gastric polyp area no H pylori.   FOOT SURGERY     LEFT HEART CATH AND CORONARY ANGIOGRAPHY N/A 03/22/2018    Procedure: LEFT HEART CATH AND CORONARY ANGIOGRAPHY;  Surgeon: Burnell Blanks, MD;  Location: [facility];  Service: Cardiovascular;  Laterality: N/A;   LEG SURGERY Right    POLYPECTOMY  04/20/2022   Procedure: POLYPECTOMY;  Surgeon: Eloise Harman, DO;  [facility];  Service: Endoscopy;;   REPLACEMENT TOTAL KNEE Right    SPINAL CORD STIMULATOR IMPLANT     FAMILY HISTORY Family History  Problem Relation Age of Onset   Cancer Mother 45       colon   Cancer Father  renal cell   Colon cancer Father    Breast cancer Sister    Cancer Sister        primary brain, and primary breast too   Breast cancer Sister    Cancer Sister        breast    Breast cancer Sister    Breast cancer Sister    Cancer Brother        kidney and liver   Sleep apnea Neg Hx    SOCIAL HISTORY Social History   Tobacco Use   Smoking status: Never   Smokeless tobacco: Never  Vaping Use   Vaping Use: Never used  Substance Use Topics   Alcohol use: No    Alcohol/week: 0.0 standard drinks of alcohol   Drug use: No       OPHTHALMIC EXAM:  Base Eye Exam     Visual Acuity (Snellen - Linear)       Right Left   Dist cc 20/40 -2 20/30 -2   Dist ph cc NI NI    Correction: Glasses         Tonometry (Tonopen, 8:14 AM)       Right Left   Pressure 18 19         Pupils       Pupils Dark Light Shape React APD   Right PERRL 3 2 Round Sluggish None   Left PERRL 3 2 Round Sluggish None         Visual Fields       Left Right    Full Full         Extraocular Movement       Right Left    Full, Ortho Full, Ortho         Neuro/Psych     Oriented x3: Yes   Mood/Affect: Normal         Dilation     Both eyes: 2.5% Phenylephrine @ 8:14 AM           Slit Lamp and Fundus Exam     Slit Lamp Exam       Right Left   Lids/Lashes Dermatochalasis - upper lid, mild MGD Dermatochalasis - upper lid, mild MGD   Conjunctiva/Sclera nasal  pingeucula nasal pingeucula   Cornea arcus, mild tear film debris arcus, mild tear film debris   Anterior Chamber deep and clear, no cell or flare deep and clear, no cell or flare   Iris Round and dilated, No NVI Round and dilated, No NVI   Lens 2+ Nuclear sclerosis, 2-3+ Cortical cataract, 1+ Posterior subcapsular cataract 2+ Nuclear sclerosis, 2-3+ Cortical cataract, 1+ Posterior subcapsular cataract   Anterior Vitreous Vitreous syneresis Vitreous syneresis         Fundus Exam       Right Left   Disc Pink and Sharp, no NVD, temporal PPA Pink and Sharp, no NVD, temporal PPP   C/D Ratio 0.4 0.4   Macula Blunted foveal reflex, +cystic changes, scattered MA Flat, Blunted foveal reflex, scattered MA   Vessels attenuated, mild tortuosity attenuated, Tortuous, +NV   Periphery Attached, dense 360 PRP extending posteriorly to arcades Attached, 360 PRP with room for fill in           Refraction     Wearing Rx       Sphere Cylinder Axis Add   Right +1.75 +1.50 124 +2.50   Left +4.25 +2.25 001 +2.50  IMAGING AND PROCEDURES  Imaging and Procedures for 08/01/2022  OCT, Retina - OU - Both Eyes       Right Eye Quality was good. Central Foveal Thickness: 396. Progression has no prior data. Findings include no SRF, abnormal foveal contour, intraretinal fluid (+DME temporal macula and centrally).   Left Eye Quality was good. Central Foveal Thickness: 300. Progression has no prior data. Findings include normal foveal contour, no IRF, no SRF (Trace cystic changes temporal macula).   Notes *Images captured and stored on drive  Diagnosis / Impression:  OD: +DME OS: NFP; no IRF/SRF centrally; tr cystic changes temporal mac  Clinical management:  See below  Abbreviations: NFP - Normal foveal profile. CME - cystoid macular edema. PED - pigment epithelial detachment. IRF - intraretinal fluid. SRF - subretinal fluid. EZ - ellipsoid zone. ERM - epiretinal membrane. ORA - outer  retinal atrophy. ORT - outer retinal tubulation. SRHM - subretinal hyper-reflective material. IRHM - intraretinal hyper-reflective material      Fluorescein Angiography Optos (Transit OD)       Right Eye Progression has no prior data. Early phase findings include leakage, staining, microaneurysm (Cluster of leakage temporal macula). Mid/Late phase findings include leakage, staining, microaneurysm (No NV).   Left Eye Progression has no prior data. Early phase findings include leakage, staining, microaneurysm, retinal neovascularization, vascular perfusion defect (Scattered focal NVE temporal hemisphere). Mid/Late phase findings include leakage, staining, microaneurysm, retinal neovascularization, vascular perfusion defect (Scattered focal NVE greatest inferior and temporal quads).   Notes **Images stored on drive**  Impression: History of PDR OU s/p PRP 360 OU OD: Cluster of leakage temporal macula, no NV OS: Scattered focal NVE greatest inferior and temporal quads      Intravitreal Injection, Pharmacologic Agent - OD - Right Eye       Time Out 08/01/2022. 9:47 AM. Confirmed correct patient, procedure, site, and patient consented.   Anesthesia Topical anesthesia was used. Anesthetic medications included Lidocaine 2%, Proparacaine 0.5%.   Procedure A supplied needle was used.   Injection: 1.25 mg Bevacizumab 1.94m/0.05ml   Route: Intravitreal, Site: Right Eye   NDC:: 81829-937-16 Lot: [id number] , Expiration date: 08/16/2022   Post-op Post injection exam found visual acuity of at least counting fingers. The patient tolerated the procedure well. There were no complications. The patient received written and verbal post procedure care education.            ASSESSMENT/PLAN:    ICD-10-CM   1. Proliferative diabetic retinopathy of both eyes with macular edema associated with type 2 diabetes mellitus (HCC)  E11.3513 OCT, Retina - OU - Both Eyes    Fluorescein Angiography  Optos (Transit OD)    Intravitreal Injection, Pharmacologic Agent - OD - Right Eye    Bevacizumab (AVASTIN) SOLN 1.25 mg    2. Essential hypertension  I10     3. Hypertensive retinopathy of both eyes  H35.033 Fluorescein Angiography Optos (Transit OD)    4. Combined forms of age-related cataract of both eyes  H25.813      Proliferative diabetic retinopathy OU - previously managed by Dr. BValetta Close-- lost to f/u since 2020 - s/p PRP OU and IVA OS x7 (BB) - The incidence, risk factors for progression, natural history and treatment options for diabetic retinopathy were discussed with patient.   - The need for close monitoring of blood glucose, blood pressure, and serum lipids, avoiding cigarette or any type of tobacco, and the need for long term follow up was also discussed with patient. -  exam shows scattered MA OU; laser PRP OU - FA (01.02.23) shows OD: Cluster of leakage temporal macula, no NV; OS: Scattered focal NVE greatest inferior and temporal quads -- will need PRP fill in OS - OCT shows diabetic macular edema, both eyes (OD > OS) - The natural history, pathology, and characteristics of diabetic macular edema discussed with patient.  A generalized discussion of the major clinical trials concerning treatment of diabetic macular edema (ETDRS, DCT, SCORE, RISE / RIDE, and ongoing DRCR net studies) was completed.  This discussion included mention of the various approaches to treating diabetic macular edema (observation, laser photocoagulation, anti-VEGF injections with lucentis / Avastin / Eylea, steroid injections with Kenalog / Ozurdex, and intraocular surgery with vitrectomy).  The goal hemoglobin A1C of 6-7 was discussed, as well as importance of smoking cessation and hypertension control.  Need for ongoing treatment and monitoring were specifically discussed with reference to chronic nature of diabetic macular edema. - recommend IVA OU, but will start with OD #1 today, 01.02.23 - pt will come  back on Thursday for OS injection - pt wishes to proceed - RBA of procedure discussed, questions answered - IVA informed consent obtained and signed, 01.02.24 (OU) - see procedure note - f/u Thursday -- OCT, possible injection OS  2,3. Hypertensive retinopathy OU - discussed importance of tight BP control - monitor  4. Mixed Cataract OU - The symptoms of cataract, surgical options, and treatments and risks were discussed with patient. - discussed diagnosis and progression - monitor  Ophthalmic Meds Ordered this visit:  Meds ordered this encounter  Medications   Bevacizumab (AVASTIN) SOLN 1.25 mg     Return in about 2 days (around 08/03/2022) for OCT, Possible Injxn OS.  There are no Patient Instructions on file for this visit.   Explained the diagnoses, plan, and follow up with the patient and they expressed understanding.  Patient expressed understanding of the importance of proper follow up care.   This document serves as a record of services personally performed by Gardiner Sleeper, MD, PhD. It was created on their behalf by Renaldo Reel, [place] an ophthalmic technician. The creation of this record is the provider's dictation and/or activities during the visit.    Electronically signed by:  Renaldo Reel, COT  12.20.23 12:52 PM  This document serves as a record of services personally performed by Gardiner Sleeper, MD, PhD. It was created on their behalf by San Jetty. Owens Shark, OA an ophthalmic technician. The creation of this record is the provider's dictation and/or activities during the visit.    Electronically signed by: San Jetty. Owens Shark, [place] 01.02.2024 12:52 PM  Gardiner Sleeper, M.D., Ph.D. Diseases & Surgery of the Retina and Vitreous [facility]  I have reviewed the above documentation for accuracy and completeness, and I agree with the above. Gardiner Sleeper, M.D., Ph.D. 08/02/22 12:52 PM  Abbreviations: M myopia (nearsighted); A astigmatism;  H hyperopia (farsighted); P presbyopia; Mrx spectacle prescription;  CTL contact lenses; OD right eye; OS left eye; OU both eyes  XT exotropia; ET esotropia; PEK punctate epithelial keratitis; PEE punctate epithelial erosions; DES dry eye syndrome; MGD meibomian gland dysfunction; ATs artificial tears; PFAT's preservative free artificial tears; [place] nuclear sclerotic cataract; PSC posterior subcapsular cataract; ERM epi-retinal membrane; PVD posterior vitreous detachment; RD retinal detachment; DM diabetes mellitus; DR diabetic retinopathy; NPDR non-proliferative diabetic retinopathy; PDR proliferative diabetic retinopathy; CSME clinically significant macular edema; DME diabetic macular edema; dbh dot blot hemorrhages;  CWS cotton wool spot; POAG primary open angle glaucoma; C/D cup-to-disc ratio; HVF humphrey visual field; GVF goldmann visual field; OCT optical coherence tomography; IOP intraocular pressure; BRVO Branch retinal vein occlusion; CRVO central retinal vein occlusion; CRAO central retinal artery occlusion; BRAO branch retinal artery occlusion; RT retinal tear; SB scleral buckle; PPV pars plana vitrectomy; VH Vitreous hemorrhage; PRP panretinal laser photocoagulation; IVK intravitreal kenalog; VMT vitreomacular traction; MH Macular hole;  NVD neovascularization of the disc; NVE neovascularization elsewhere; AREDS age related eye disease study; ARMD age related macular degeneration; POAG primary open angle glaucoma; EBMD epithelial/anterior basement membrane dystrophy; ACIOL anterior chamber intraocular lens; IOL intraocular lens; PCIOL posterior chamber intraocular lens; Phaco/IOL phacoemulsification with intraocular lens placement; [place] photorefractive keratectomy; LASIK laser assisted in situ keratomileusis; HTN hypertension; DM diabetes mellitus; COPD chronic obstructive pulmonary disease

## 2022-07-26 ENCOUNTER — Ambulatory Visit (INDEPENDENT_AMBULATORY_CARE_PROVIDER_SITE_OTHER): Payer: Medicare Other

## 2022-07-26 DIAGNOSIS — J309 Allergic rhinitis, unspecified: Secondary | ICD-10-CM

## 2022-07-26 NOTE — Progress Notes (Signed)
NEUROLOGY FOLLOW UP OFFICE NOTE  Jodi Ray [id number]  Assessment/Plan:   1  Persistent vertigo/dizziness - unclear etiology.  Consider residual chronic symptoms of a cerebellar infarct (possible.  CT may miss a stroke but cannot have MRI due to pain stimulator) vs Chronic vestibular migraine (but her headaches which are cluster headaches rather than migraine are well controlled) vs vestibular neuritis (but no preceding viral illness) vs persistent postural-percepttual dizziness. 2  Episodic cluster headache, controlled 3  Obstructive sleep apnea    Workup: CTA head and neck to evaluate for any posterior arterial stenosis/occlusion  Will order VNG vs ENG Pending results of above, will consider treating with vestibular rehab and possibly an SSRI Cluster Rescue:  100% O2 15L/min for 15-20 min Limit use of pain relievers to no more than 2 days out of week to prevent risk of rebound or medication-overuse headache. Keep headache diary Continue CPAP Follow up 3 months     Subjective:  Jodi Ray is a 61 year old handed woman with chronic pain related to complex regional pain syndrome (with spinal stimulator), hypertension, type 1 diabetes mellitus with polyneuropathy, OSA on CPAP, hypothyroidism, polymyalgia rheumatica who follows up for cluster headache and dizziness.  Her PCP ordered a CT of the head performed on 07/11/2022 which revealed age-related atrophy and chronic small vessel ischemic changes within the periventricular white matter but no acute findings.     UPDATE: Not on Emgality.  Headaches are well controlled. Intensity:  severe Duration:  15 minutes with O2 Frequency:  "almost non-existent"  In mid October, she was laying down in bed.  She turned from her fight to left side and room started spinning lasting a minute.  The next day, it returned but has been persistent ever since.  No headache.  No speech disturbance or unilateral numbness or weakness.  Unchanged  in last 2 months . No preceding viral illness.  No lateralizing symptoms.  She saw her ENT who found no inner ear etiology  Cardiology didn't find any cause.  PCP ordered CT of head performed on 07/11/2022 showed atrophy and chronic small vessel ischemic changes but no acute findings.  Due to the dizziness, she now uses a roller.    Current NSAIDs:  ASA '81mg'$  daily. Current analgesics:  none Current antihypertensive:  Coreg Current anti-epileptic:  none Current anti-CGRP:  Emgality Supplements:  Melatonin '20mg'$ ; CoQ10 '100mg'$ ; D3 Other therapy:  100% O2 Other medications:  Synthroid Using CPAP Exercises regularly   HISTORY: I  Primary Stabbing Headache: Onset:  2011.  She was previously treated in [place], where she was diagnosed with primary stabbing headache. Location:  Right frontal region Quality:  stabbing Intensity:  10/10 Aura:  no Prodrome:  no Associated symptoms:  Some nausea if severe. No autonomic symptoms. Duration:  10 to 60 minutes Frequency:  4 times a week Triggers/exacerbating factors:  change in weather. Relieving factors:  none Activity:  Cannot function when experiencing it.   II Cluster headache:  She began having new intractable headaches in late Decemeber 2019.  Her right eye gets bloodshot and right nare runs.  Right side of face gets red.  They stabbing headache starts above the right eye and radiates, constant for a month, fluctuating in intensity.  She went to the [facility] ED on 08/11/18.  CT of head and CTA of head and neck were performed and personally reviewed, showing no acute abnormality or aneurysm.  Sed rate from 08/22/18 was 24.  Past management:  Prednisone taper, headache cocktail               III  Migraines:  They are bi-frontal/maxillary and associated with slight nausea.  She has had this before when her allergies "act up."     IV  Diabetic neuropathy:  Secondary to Type 1 diabetes.   V  Complex regional pain syndrome:  following an  accident where she fell down the steps and crushed her right leg.  She takes gabapentin, tramadol and has a spinal nerve stimulator.  She takes this for her painful diabetic neuropathy as well.   VI  Right arm pain and numbness:  Since 2015, she has had episodes of right arm pain and numbness.  It only occurs when she is laying in bed, and occurs no matter what position.  Her entire arm goes "dead".  It is both numb, painful and unable to move it.  There is no shooting pain down the arm from the neck, however she gets occasional shooting pain from the right side of her neck into the shoulder.  She has to use her other arm to shake it out and it resolves in a minute or two.  It occurs every night.  She was sent to pain management for possible cervical radiculopathy or thoracic outlet syndrome.  She did receive injections, which helped.  She had an MRI of the cervical and thoracic spine on 06/12/14.  Imaging not available, but report mentions diffuse facet arthropathy and degenerative changes in the cervical spine, but no nerve root impingement or cord compression.  There was no evidence of thoracic outlet syndrome.  She was sent to [facility] for further evaluation.  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  They told her that her symptoms were related to her diabetic neuropathy.   VII  Cerebral aneurysm:  In 2009, she had a CTA of the head which reportedly showed a 1-10m an eurysm at the junction of right A1 and A2 segment of the ACA.  However, a repeat CTA performed on 02/22/12 did not reveal any aneurysm.     She cannot have an MRI due to spinal stimulator.   Past medictions:   NSAIDs:  Indomethacin '25mg'$  three times daily (effective but discontinued due to elevated liver enzymes) Past analgesics:  Lidocaine nasal (lost efficacy) Past anthypertensive medication:   Verapamil (ineffective), lisinopril Past antiepileptics:  Trokendi XR (effective but expensive), topiramate, gabapentin  PAST MEDICAL HISTORY: Past Medical History:  Diagnosis Date   [place]    Anemia    Brain aneurysm    frontal lobe   CAD (coronary artery disease)    a. 02/2018: s/p DES to Proximal LAD with residual 20% RCA stenosis.   Chronic pain    Diabetes mellitus without complication (HCC)    Headache    History of left bundle branch block (LBBB)    HTN (hypertension)    Hx of cardiovascular stress test 10/2016   intermediate risk study   Hypothyroidism    [place]romuscular [place] ([place]    Neuropathy    Obesity    OSA on CPAP 03/10/2015   Renal insufficiency    Retinopathy    Sleep apnea    Varicose veins of both lower extremities     MEDICATIONS: Current Outpatient Medications on File Prior to Visit  Medication Sig Dispense Refill   aspirin EC 81 MG  tablet Take 1 tablet (81 mg total) by mouth daily. Swallow whole. 90 tablet 3   atorvastatin (LIPITOR) 80 MG tablet TAKE ONE TABLET BY MOUTH DAILY AT 6 PM (Patient taking differently: Take 80 mg by mouth daily.) 90 tablet 3   calcitRIOL (ROCALTROL) 0.25 MCG capsule Take 0.25 mcg by mouth every other day.     Continuous Blood Gluc Sensor (DEXCOM G6 SENSOR) MISC 1 Device by Other route as directed.     EPINEPHrine 0.3 mg/0.3 mL IJ SOAJ injection Inject 0.3 mg into the muscle as needed for anaphylaxis. 2 each 1   fish oil-omega-3 fatty acids 1000 MG capsule Take 1 g by mouth in the morning and at bedtime.     furosemide (LASIX) 40 MG tablet Take 20-40 mg by mouth 2 (two) times daily. 40 MG AM, 20 MG PM     glucose blood ([employer] BLOOD GLUCOSE TEST) test strip      Insulin Human (INSULIN PUMP) SOLN Inject into the skin continuous.      JARDIANCE 10 MG TABS tablet Take 5 mg by mouth every other day.     KERENDIA 10 MG TABS Take 1 tablet by mouth daily.     levothyroxine (SYNTHROID) 112 MCG tablet Take 112 mcg by mouth  daily before breakfast.     loratadine (CLARITIN) 10 MG tablet Take 2 tablets (20 mg total) by mouth daily. 180 tablet 3   metFORMIN (GLUCOPHAGE) 500 MG tablet Take 1 tablet (500 mg total) by mouth 2 (two) times daily with a meal.     nitroGLYCERIN (NITROSTAT) 0.4 MG SL tablet Place 1 tablet (0.4 mg total) under the tongue every 5 (five) minutes as needed for chest pain. 25 tablet 3   NOVOLOG 100 UNIT/ML injection Inject into the skin continuous. In insulin pump  0   OZEMPIC, 0.25 OR 0.5 MG/DOSE, 2 MG/3ML SOPN Inject 0.25 mg into the skin once a week.     pantoprazole (PROTONIX) 40 MG tablet Take 1 tablet (40 mg total) by mouth daily. 90 tablet 3   No current facility-administered medications on file prior to visit.    ALLERGIES: Allergies  Allergen Reactions   Other Nausea And Vomiting and Other (See Comments)    Opiates Alters mental status diarrhea   Fentanyl Nausea And Vomiting and Other (See Comments)    Nausea, vomiting, loss of consciousness, requiring reversal    FAMILY HISTORY: Family History  Problem Relation Age of Onset   Cancer Mother 46       colon   Cancer Father        renal cell   Colon cancer Father    Breast cancer Sister    Cancer Sister        primary brain, and primary breast too   Breast cancer Sister    Cancer Sister        breast    Breast cancer Sister    Breast cancer Sister    Cancer Brother        kidney and liver   Sleep apnea Neg Hx       Objective:  Blood pressure (!) 146/73, pulse 76, height '5\' 8"'$  (1.727 m), weight 251 lb (113.9 kg), SpO2 94 %. General: No acute distress.  Patient appears well-groomed.   Head:  Normocephalic/atraumatic Eyes:  Fundi examined but not visualized Neck: supple, no paraspinal tenderness, full range of motion Heart:  Regular rate and rhythm Neurological Exam: alert and oriented to person, place, and time.  Speech fluent and not dysarthric, language intact.  CN II-XII intact. No nystagmus.  Bulk and tone  normal, muscle strength 5/5 throughout.  Sensation to light touch intact.  Deep tendon reflexes 2+ throughout, toes downgoing.  Finger to nose testing intact.  Unsteady and cautious.  Cannot ambulate without assistance.     Metta Clines, DO  CC: Monico Blitz, MD

## 2022-07-27 ENCOUNTER — Encounter: Payer: Self-pay | Admitting: *Deleted

## 2022-07-27 ENCOUNTER — Ambulatory Visit: Payer: Medicare Other | Attending: Cardiology | Admitting: Cardiology

## 2022-07-27 VITALS — Ht 68.0 in | Wt 253.0 lb

## 2022-07-27 DIAGNOSIS — I10 Essential (primary) hypertension: Secondary | ICD-10-CM | POA: Diagnosis not present

## 2022-07-27 DIAGNOSIS — I2510 Atherosclerotic heart disease of native coronary artery without angina pectoris: Secondary | ICD-10-CM | POA: Diagnosis not present

## 2022-07-27 NOTE — Progress Notes (Signed)
Clinical Summary Jodi Ray is a 61 y.o.female seen today for follow up of the following medical problems.    1. CAD - drug-eluting stent placement to the proximal LAD on 03/22/2018. - from notes has chronic nonspecific chest pains  - LV systolic function is normal with an EF of 60 to 65% by echocardiogram in December 2019   - does cycling 3 days a week x 1 hr 15 min high impact up to 11 miles, weight training 4 days a week.  - occasonal chest pressure at very high levels of exertion.  - compliant with meds. Had some issues with low bp's previously, from 03/13/2019 note coreg was changed to just prior to exercise       - she stopped cardiac rehab - goes to [facility] 6 days a week. Does cycling classes, does 100 miles per week - she is off aspirin, she is not sure when and why stopped in the past. Denies any issues with it   - last few months reporst significant decline in her exercise capacity - midchest, squeezing like pain or heaviness heart pounding. Can be 8/10 in severity. +SOB.Lasts a few minutes. Can occur at rest or with exertion. Not positional. Occurs 2-3 times per week. Symptoms started about 2 months ago. Started only when she was exercising, now occurring at rest. Compliant with meds - overall decrease in stamina   01/2022 nuclear stress: no ischemia - no recent chest pains.    2. HTN - compliant with meds Had some issues with low bp's previously, from 03/13/2019 note coreg was changed to just prior to exercise   - recently has been off bp meds: coreg, lisinopril  - home bp's 110s-130s, rare 140s or higher. DBP 50s-70s, HRs 70s to 80s     3. Hyperlipidemia - upcoming labs with pcp - Jan 2022 TC 167 TG 211 LDL 86 01/2021 TC 157 TG 34 HDL 88 LDL 61   4. OSA - using cpap machine, followed by Dr Brett Fairy.    5. DM1 - followed by endocrinology   6. CKD 3 - followed by Dr Theador Hawthorne - had renal biopsy 06/28/2021. - from renal notes historically sensitive to  diuretics - lasix managed by nephrology, takes 2 tables in AM and 1 tablet in PM. Swelling is much improved.      7. Chronic LBBB   8. Chronic diastolic HF  Echo LVEF [id number], grade I dd - diuretics per renal, from there note has historically been sensitive to diuretic dosing regarding her renal function  - no recent fluid issuess  9.Dizziness - symptoms would occur with laying down, worst with rolling over. Feeling of room spinning, nausea. Would get some dizziness with standing and walking. Intermittent blurry vision - seen by ENT, thought not related.    - 02/2022 monitor rare ectopy, 6 runs SVT longest 10 beats 06/2022 CT head: no acute process. Atrophy and chronic small vessel ischemic changes  - ongoing workup by pcp, ENT.  10. Palpitations - - 02/2022 monitor rare ectopy, 6 runs SVT longest 10 beats  Past Medical History:  Diagnosis Date   Adult RDS ([place])    Anemia    Brain aneurysm    frontal lobe   CAD (coronary artery disease)    a. 02/2018: s/p DES to Proximal LAD with residual 20% RCA stenosis.   Chronic pain    Diabetes mellitus without complication (HCC)    Headache    History of left bundle Jodi Ray  block (LBBB)    HTN (hypertension)    Hx of cardiovascular stress test 10/2016   intermediate risk study   Hypothyroidism    Neuromuscular disorder (HCC)    Neuropathy    Obesity    OSA on CPAP 03/10/2015   Renal insufficiency    Retinopathy    Sleep apnea    Varicose veins of both lower extremities      Allergies  Allergen Reactions   Other Nausea And Vomiting and Other (See Comments)    Opiates Alters mental status diarrhea   Fentanyl Nausea And Vomiting and Other (See Comments)    Nausea, vomiting, loss of consciousness, requiring reversal     Current Outpatient Medications  Medication Sig Dispense Refill   aspirin EC 81 MG tablet Take 1 tablet (81 mg total) by mouth daily. Swallow whole. 90 tablet 3   atorvastatin (LIPITOR) 80 MG tablet TAKE  ONE TABLET BY MOUTH DAILY AT 6 PM (Patient taking differently: Take 80 mg by mouth daily.) 90 tablet 3   calcitRIOL (ROCALTROL) 0.25 MCG capsule Take 0.25 mcg by mouth every other day.     Continuous Blood Gluc Sensor (DEXCOM G6 SENSOR) MISC 1 Device by Other route as directed.     EPINEPHrine 0.3 mg/0.3 mL IJ SOAJ injection Inject 0.3 mg into the muscle as needed for anaphylaxis. 2 each 1   fish oil-omega-3 fatty acids 1000 MG capsule Take 1 g by mouth in the morning and at bedtime.     furosemide (LASIX) 40 MG tablet Take 20-40 mg by mouth 2 (two) times daily. 40 MG AM, 20 MG PM     glucose blood ([employer] BLOOD GLUCOSE TEST) test strip      Insulin Human (INSULIN PUMP) SOLN Inject into the skin continuous.      JARDIANCE 10 MG TABS tablet Take 5 mg by mouth every other day.     KERENDIA 10 MG TABS Take 1 tablet by mouth daily.     levothyroxine (SYNTHROID) 112 MCG tablet Take 112 mcg by mouth daily before breakfast.     loratadine (CLARITIN) 10 MG tablet Take 2 tablets (20 mg total) by mouth daily. 180 tablet 3   metFORMIN (GLUCOPHAGE) 500 MG tablet Take 1 tablet (500 mg total) by mouth 2 (two) times daily with a meal.     nitroGLYCERIN (NITROSTAT) 0.4 MG SL tablet Place 1 tablet (0.4 mg total) under the tongue every 5 (five) minutes as needed for chest pain. 25 tablet 3   NOVOLOG 100 UNIT/ML injection Inject into the skin continuous. In insulin pump  0   OZEMPIC, 0.25 OR 0.5 MG/DOSE, 2 MG/3ML SOPN Inject 0.25 mg into the skin once a week.     pantoprazole (PROTONIX) 40 MG tablet Take 1 tablet (40 mg total) by mouth daily. 90 tablet 3   No current facility-administered medications for this visit.     Past Surgical History:  Procedure Laterality Date   BIOPSY  11/14/2016   Procedure: BIOPSY;  Surgeon: Danie Binder, MD;  [facility]: [facility];  Service: Endoscopy;;  colon gastric duodenal   BREAST EXCISIONAL BIOPSY Right    BREAST SURGERY Left 1994   Lumpectomy   COLONOSCOPY WITH  PROPOFOL N/A 11/14/2016   Dr. Oneida Alar: Moderately redundant rectosigmoid colon. Random colon biopsies benign. Internal hemorrhoids. Surveillance colonoscopy in 5 years.   COLONOSCOPY WITH PROPOFOL N/A 04/20/2022   Procedure: COLONOSCOPY WITH PROPOFOL;  Surgeon: Eloise Harman, DO;  Location: [facility];  Service:  Endoscopy;  Laterality: N/A;  12:30am   CORONARY STENT INTERVENTION N/A 03/22/2018   Procedure: CORONARY STENT INTERVENTION;  Surgeon: Burnell Blanks, MD;  Location: [place] [facility];  Service: Cardiovascular;  Laterality: N/A;   ESOPHAGOGASTRODUODENOSCOPY (EGD) WITH PROPOFOL N/A 11/14/2016   Dr. Oneida Alar: Esophagus normal. Moderate gastritis, few gastric polyps. Duodenal biopsies negative. Fundic gland polyp gastric polyp area no H pylori.   FOOT SURGERY     LEFT HEART CATH AND CORONARY ANGIOGRAPHY N/A 03/22/2018   Procedure: LEFT HEART CATH AND CORONARY ANGIOGRAPHY;  Surgeon: Burnell Blanks, MD;  Location: [place] [facility];  Service: Cardiovascular;  Laterality: N/A;   LEG SURGERY Right    POLYPECTOMY  04/20/2022   Procedure: POLYPECTOMY;  Surgeon: Eloise Harman, DO;  Location: [facility];  Service: Endoscopy;;   REPLACEMENT TOTAL KNEE Right    SPINAL CORD STIMULATOR IMPLANT       Allergies  Allergen Reactions   Other Nausea And Vomiting and Other (See Comments)    Opiates Alters mental status diarrhea   Fentanyl Nausea And Vomiting and Other (See Comments)    Nausea, vomiting, loss of consciousness, requiring reversal      Family History  Problem Relation Age of Onset   Cancer Mother 46       colon   Cancer Father        renal cell   Colon cancer Father    Breast cancer Sister    Cancer Sister        primary brain, and primary breast too   Breast cancer Sister    Cancer Sister        breast    Breast cancer Sister    Breast cancer Sister    Cancer Brother        kidney and liver   Sleep apnea Neg Hx      Social History Ms.  Foos reports that she has never smoked. She has never used smokeless tobacco. Ms. Haapala reports no history of alcohol use.   Review of Systems CONSTITUTIONAL: No weight loss, fever, chills, weakness or fatigue.  HEENT: Eyes: No visual loss, blurred vision, double vision or yellow sclerae.No hearing loss, sneezing, congestion, runny nose or sore throat.  SKIN: No rash or itching.  CARDIOVASCULAR: per hpi RESPIRATORY: per hpi GASTROINTESTINAL: No anorexia, nausea, vomiting or diarrhea. No abdominal pain or blood.  GENITOURINARY: No burning on urination, no polyuria NEUROLOGICAL: per hpi MUSCULOSKELETAL: No muscle, back pain, joint pain or stiffness.  LYMPHATICS: No enlarged nodes. No history of splenectomy.  PSYCHIATRIC: No history of depression or anxiety.  ENDOCRINOLOGIC: No reports of sweating, cold or heat intolerance. No polyuria or polydipsia.  Marland Kitchen   Physical Examination Today's Vitals   07/27/22 0826  Weight: 253 lb (114.8 kg)  Height: '5\' 8"'$  (1.727 m)  PainSc: 5   PainLoc: Leg   Body mass index is 38.47 kg/m.  Gen: resting comfortably, no acute distress HEENT: no scleral icterus, pupils equal round and reactive, no palptable cervical adenopathy,  CV: RRR, no m/rg, no jvd Resp: Clear to auscultation bilaterally GI: abdomen is soft, non-tender, non-distended, normal bowel sounds, no hepatosplenomegaly MSK: extremities are warm, no edema.  Skin: warm, no rash Neuro:  no focal deficits Psych: appropriate affect   Diagnostic Studies  Echocardiogram on 07/17/2018 demonstrated normal left ventricular systolic function and regional wall motion, LVEF 60 to 65%, indeterminate grade diastolic dysfunction, moderate LVH, and mild left atrial dilatation.     Cardiac  catheterization 03/22/18:   Prox RCA lesion is 20% stenosed. Prox LAD lesion is 80% stenosed. A drug-eluting stent was successfully placed using a STENT SIERRA 3.00 X 18 MM. Post intervention, there is a 0%  residual stenosis. The left ventricular systolic function is normal. LV end diastolic pressure is normal. The left ventricular ejection fraction is 55-65% by visual estimate. There is no mitral valve regurgitation.   1. Severe stenosis proximal LAD 2. Successful PTCA/DES x 1 proximal LAD 3. Mild non-obstructive disease in the RCA\ 4. Normal LV systolic function   [id number] echo 1. Left ventricular ejection fraction, by estimation, is 60 to 65%. The  left ventricle has normal function. The left ventricle has no regional  wall motion abnormalities. Left ventricular diastolic parameters are  consistent with Grade I diastolic  dysfunction (impaired relaxation).   2. Right ventricular systolic function is normal. The right ventricular  size is normal. Tricuspid regurgitation signal is inadequate for assessing  PA pressure.   3. The mitral valve is degenerative. No evidence of mitral valve  regurgitation. No evidence of mitral stenosis.   4. The aortic valve is tricuspid. Aortic valve regurgitation is not  visualized. No aortic stenosis is present.   5. The inferior vena cava is dilated in size with >50% respiratory  variability, suggesting right atrial pressure of 8 mmHg.    10/2020 echo IMPRESSIONS     1. Left ventricular ejection fraction, by estimation, is 60 to 65%. The  left ventricle has normal function. The left ventricle has no regional  wall motion abnormalities. Left ventricular diastolic parameters are  consistent with Grade I diastolic  dysfunction (impaired relaxation).   2. Right ventricular systolic function is normal. The right ventricular  size is normal.   3. Left atrial size was severely dilated.   4. Right atrial size was mildly dilated.   5. The mitral valve is normal in structure. No evidence of mitral valve  regurgitation. No evidence of mitral stenosis.   6. The aortic valve is tricuspid. There is mild calcification of the  aortic valve. There is mild  thickening of the aortic valve. Aortic valve  regurgitation is not visualized. No aortic stenosis is present.   7. The inferior vena cava is normal in size with greater than 50%  respiratory variability, suggesting right atrial pressure of 3 mmHg.    01/2022 nuclear stress The study is normal. The study is low risk.   No ST deviation was noted.   LV perfusion is normal. There is no evidence of ischemia. There is no evidence of infarction.   Left ventricular function is normal.EF [id number]  End diastolic cavity size is normal. End systolic cavity size is normal.   02/2022 14 day monitor 14 day monitor   Rare supraventricular ectopy in the form of isolated PACs, couplets, triplets. 6 runs of SVT longest 10 beats.   Rare ventricular ectopy in the form of isolated PVCs, couplets   Reported symptoms correlated with sinus rhythm, rare PACs and PVCs  Assessment and Plan  1.  Coronary artery disease - no recent issues, continue current meds   2.  Hypertension -continue current meds         Arnoldo Lenis, M.D.

## 2022-07-27 NOTE — Patient Instructions (Signed)

## 2022-07-28 ENCOUNTER — Encounter: Payer: Self-pay | Admitting: Internal Medicine

## 2022-07-28 ENCOUNTER — Encounter: Payer: Self-pay | Admitting: Neurology

## 2022-07-28 ENCOUNTER — Ambulatory Visit (INDEPENDENT_AMBULATORY_CARE_PROVIDER_SITE_OTHER): Payer: Medicare Other | Admitting: Neurology

## 2022-07-28 VITALS — BP 146/73 | HR 76 | Ht 68.0 in | Wt 251.0 lb

## 2022-07-28 DIAGNOSIS — G4733 Obstructive sleep apnea (adult) (pediatric): Secondary | ICD-10-CM

## 2022-07-28 DIAGNOSIS — G44019 Episodic cluster headache, not intractable: Secondary | ICD-10-CM | POA: Diagnosis not present

## 2022-07-28 DIAGNOSIS — I2510 Atherosclerotic heart disease of native coronary artery without angina pectoris: Secondary | ICD-10-CM

## 2022-07-28 DIAGNOSIS — R42 Dizziness and giddiness: Secondary | ICD-10-CM

## 2022-07-28 NOTE — Patient Instructions (Signed)
Check CTA head and neck Will order VNG (vestibular testing) to be performed at Dr. Deeann Saint office Pending results of above, will likely start a medication (such as an antidepressant which we use to treat multiple neurologic conditions) and referral to physical therapy for vestibular rehab Follow up 3 months.

## 2022-08-01 ENCOUNTER — Encounter (INDEPENDENT_AMBULATORY_CARE_PROVIDER_SITE_OTHER): Payer: Self-pay | Admitting: Ophthalmology

## 2022-08-01 ENCOUNTER — Ambulatory Visit (INDEPENDENT_AMBULATORY_CARE_PROVIDER_SITE_OTHER): Payer: Medicare Other | Admitting: Ophthalmology

## 2022-08-01 DIAGNOSIS — H25813 Combined forms of age-related cataract, bilateral: Secondary | ICD-10-CM | POA: Diagnosis not present

## 2022-08-01 DIAGNOSIS — H35033 Hypertensive retinopathy, bilateral: Secondary | ICD-10-CM | POA: Diagnosis not present

## 2022-08-01 DIAGNOSIS — E113513 Type 2 diabetes mellitus with proliferative diabetic retinopathy with macular edema, bilateral: Secondary | ICD-10-CM

## 2022-08-01 DIAGNOSIS — I10 Essential (primary) hypertension: Secondary | ICD-10-CM | POA: Diagnosis not present

## 2022-08-01 MED ORDER — BEVACIZUMAB CHEMO INJECTION 1.25MG/0.05ML SYRINGE FOR KALEIDOSCOPE
1.2500 mg | INTRAVITREAL | Status: AC | PRN
  Administered 2022-08-01: 1.25 mg via INTRAVITREAL

## 2022-08-02 NOTE — Progress Notes (Signed)
[facility] Note  08/03/2022     CHIEF COMPLAINT Patient presents for Retina Follow Up   HISTORY OF PRESENT ILLNESS: Jodi Ray is a 62 y.o. female who presents to the clinic today for:   HPI     Retina Follow Up   Patient presents with  Diabetic Retinopathy.  In both eyes.  This started 2 days ago.  I, the attending physician,  performed the HPI with the patient and updated documentation appropriately.        Comments   Patient here for 2 days retina follow up for NPDR OU. IVA OS. Patient states vision OD not as blurry as was. OS doing fine. No eye pain. Gets allergy shots. Doctor said that if has injection can't have allergy shot. Is it ok to have allergy shot tomorrow?      Last edited by Bernarda Caffey, MD on 08/03/2022 11:50 AM.     Pt is here for IVA OS #1  Referring physician: Monico Blitz, MD [place],  [place] [id number]  HISTORICAL INFORMATION:   Selected notes from the MEDICAL RECORD NUMBER Referred by Dr. Mare Ferrari for concern of DME LEE:  Ocular Hx- PMH-    CURRENT MEDICATIONS: No current outpatient medications on file. (Ophthalmic Drugs)   No current facility-administered medications for this visit. (Ophthalmic Drugs)   Current Outpatient Medications (Other)  Medication Sig   aspirin EC 81 MG tablet Take 1 tablet (81 mg total) by mouth daily. Swallow whole.   atorvastatin (LIPITOR) 80 MG tablet TAKE ONE TABLET BY MOUTH DAILY AT 6 PM (Patient taking differently: Take 80 mg by mouth daily.)   calcitRIOL (ROCALTROL) 0.25 MCG capsule Take 0.25 mcg by mouth every other day.   Continuous Blood Gluc Sensor (DEXCOM G6 SENSOR) MISC 1 Device by Other route as directed.   EPINEPHrine 0.3 mg/0.3 mL IJ SOAJ injection Inject 0.3 mg into the muscle as needed for anaphylaxis.   fish oil-omega-3 fatty acids 1000 MG capsule Take 1 g by mouth in the morning and at bedtime.   furosemide (LASIX) 20 MG tablet Take 20 mg by mouth. Take 2  tablets in the morning and 1 tablet in the evening   furosemide (LASIX) 40 MG tablet Take 20-40 mg by mouth 2 (two) times daily. 40 MG AM, 20 MG PM   Glucagon (GVOKE HYPOPEN 2-PACK) 1 MG/0.2ML SOAJ    glucose blood ([employer] BLOOD GLUCOSE TEST) test strip    Insulin Human (INSULIN PUMP) SOLN Inject into the skin continuous.    Insulin Pen Needle (PEN NEEDLES 31GX5/16") 31G X 8 MM MISC as directed subcutaneous Twice a day for 30 days   JARDIANCE 10 MG TABS tablet Take 5 mg by mouth every other day.   KERENDIA 10 MG TABS Take 1 tablet by mouth daily.   levothyroxine (SYNTHROID) 112 MCG tablet Take 112 mcg by mouth daily before breakfast.   metFORMIN (GLUCOPHAGE) 500 MG tablet Take 1 tablet (500 mg total) by mouth 2 (two) times daily with a meal.   nitroGLYCERIN (NITROSTAT) 0.4 MG SL tablet Place 1 tablet (0.4 mg total) under the tongue every 5 (five) minutes as needed for chest pain.   NOVOLOG 100 UNIT/ML injection Inject into the skin continuous. In insulin pump   pantoprazole (PROTONIX) 40 MG tablet Take 1 tablet (40 mg total) by mouth daily.   loratadine (CLARITIN) 10 MG tablet Take 2 tablets (20 mg total) by mouth daily.   No  current facility-administered medications for this visit. (Other)   REVIEW OF SYSTEMS: ROS   Positive for: Endocrine, Cardiovascular, Eyes Negative for: Constitutional, Gastrointestinal, Neurological, Skin, Genitourinary, Musculoskeletal, HENT, Respiratory, Psychiatric, Allergic/Imm, Heme/Lymph Last edited by Kingsley Spittle, COT on 08/03/2022 10:44 AM.     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 02/2018: s/p DES to Proximal LAD with residual 20% RCA stenosis.   Chronic pain    Diabetes mellitus without complication (HCC)    Headache    History of left bundle branch block (LBBB)    HTN (hypertension)    Hx of cardiovascular stress test 10/2016   intermediate risk study   Hypothyroidism    Neuromuscular disorder ([place])    Neuropathy    Obesity    OSA on CPAP 03/10/2015   Renal insufficiency    Retinopathy    Sleep apnea    Varicose veins of both lower extremities    Past Surgical History:  Procedure Laterality Date   BIOPSY  11/14/2016   Procedure: BIOPSY;  Surgeon: Danie Binder, MD;  [facility];  Service: Endoscopy;;  colon gastric duodenal   BREAST EXCISIONAL BIOPSY Right    BREAST SURGERY Left 1994   Lumpectomy   COLONOSCOPY WITH PROPOFOL N/A 11/14/2016   Dr. Oneida Alar: Moderately redundant rectosigmoid colon. Random colon biopsies benign. Internal hemorrhoids. Surveillance colonoscopy in 5 years.   COLONOSCOPY WITH PROPOFOL N/A 04/20/2022   Procedure: COLONOSCOPY WITH PROPOFOL;  Surgeon: Eloise Harman, DO;  [facility];  Service: Endoscopy;  Laterality: N/A;  12:30am   CORONARY STENT INTERVENTION N/A 03/22/2018   Procedure: CORONARY STENT INTERVENTION;  Surgeon: Burnell Blanks, MD;  Location: [facility];  Service: Cardiovascular;  Laterality: N/A;   ESOPHAGOGASTRODUODENOSCOPY (EGD) WITH PROPOFOL N/A 11/14/2016   Dr. Oneida Alar: Esophagus normal. Moderate gastritis, few gastric polyps. Duodenal biopsies negative. Fundic gland polyp gastric polyp area no H pylori.   FOOT SURGERY     LEFT HEART CATH AND CORONARY ANGIOGRAPHY N/A 03/22/2018   Procedure: LEFT HEART CATH AND CORONARY ANGIOGRAPHY;  Surgeon: Burnell Blanks, MD;  Location: [facility];  Service: Cardiovascular;  Laterality: N/A;   LEG SURGERY Right    POLYPECTOMY  04/20/2022   Procedure: POLYPECTOMY;  Surgeon: Eloise Harman, DO;   [facility];  Service: Endoscopy;;   REPLACEMENT TOTAL KNEE Right    SPINAL CORD STIMULATOR IMPLANT     FAMILY HISTORY Family History  Problem Relation Age of Onset   Cancer Mother 53       colon   Cancer Father        renal cell   Colon cancer Father    Breast cancer Sister    Cancer Sister        primary brain, and primary breast too   Breast cancer Sister    Cancer Sister        breast    Breast cancer Sister    Breast cancer Sister    Cancer Brother  kidney and liver   Sleep apnea Neg Hx    SOCIAL HISTORY Social History   Tobacco Use   Smoking status: Never   Smokeless tobacco: Never  Vaping Use   Vaping Use: Never used  Substance Use Topics   Alcohol use: No    Alcohol/week: 0.0 standard drinks of alcohol   Drug use: No       OPHTHALMIC EXAM:  Base Eye Exam     Visual Acuity (Snellen - Linear)       Right Left   Dist [place] 20/40 +1 20/40   Dist ph [place] 20/30 20/25         Tonometry (Tonopen, 10:21 AM)       Right Left   Pressure 13 13         Pupils       Dark Light Shape React APD   Right 3 2 Round Sluggish None   Left 3 2 Round Sluggish None         Visual Fields (Counting fingers)       Left Right    Full Full         Extraocular Movement       Right Left    Full, Ortho Full, Ortho         Neuro/Psych     Oriented x3: Yes   Mood/Affect: Normal         Dilation     Both eyes: 1.0% Mydriacyl, 2.5% Phenylephrine @ 10:21 AM           Slit Lamp and Fundus Exam     Slit Lamp Exam       Right Left   Lids/Lashes Dermatochalasis - upper lid, mild MGD Dermatochalasis - upper lid, mild MGD   Conjunctiva/Sclera nasal pingeucula nasal pingeucula   Cornea arcus, mild tear film debris arcus, mild tear film debris   Anterior Chamber deep and clear, no cell or flare deep and clear, no cell or flare   Iris Round and dilated, No NVI Round and dilated, No NVI   Lens 2+ Nuclear sclerosis, 2-3+ Cortical  cataract, 1+ Posterior subcapsular cataract 2+ Nuclear sclerosis, 2-3+ Cortical cataract, 1+ Posterior subcapsular cataract   Anterior Vitreous Vitreous syneresis Vitreous syneresis         Fundus Exam       Right Left   Disc Pink and Sharp, no NVD, temporal PPA Pink and Sharp, no NVD, temporal PPP   C/D Ratio 0.4 0.4   Macula Blunted foveal reflex, +cystic changes, scattered MA Flat, Blunted foveal reflex, scattered MA   Vessels attenuated, mild tortuosity attenuated, Tortuous, +NV   Periphery Attached, dense 360 PRP extending posteriorly to arcades Attached, 360 PRP with room for fill in           Refraction     Wearing Rx       Sphere Cylinder Axis Add   Right +1.75 +1.50 124 +2.50   Left +4.25 +2.25 001 +2.50           IMAGING AND PROCEDURES  Imaging and Procedures for 08/03/2022  OCT, Retina - OU - Both Eyes       Right Eye Quality was good. Central Foveal Thickness: 352. Progression has improved. Findings include no SRF, abnormal foveal contour, intraretinal fluid (Interval improvement in +DME temporal macula and centrally).   Left Eye Quality was good. Central Foveal Thickness: 301. Progression has been stable. Findings include normal foveal contour, no IRF, no SRF (Trace cystic  changes temporal macula).   Notes *Images captured and stored on drive  Diagnosis / Impression:  OD: interval improvement +DME temporal macula and centrally OS: NFP; no IRF/SRF centrally; tr cystic changes temporal mac  Clinical management:  See below  Abbreviations: NFP - Normal foveal profile. CME - cystoid macular edema. PED - pigment epithelial detachment. IRF - intraretinal fluid. SRF - subretinal fluid. EZ - ellipsoid zone. ERM - epiretinal membrane. ORA - outer retinal atrophy. ORT - outer retinal tubulation. SRHM - subretinal hyper-reflective material. IRHM - intraretinal hyper-reflective material      Intravitreal Injection, Pharmacologic Agent - OS - Left Eye        Time Out 08/03/2022. 10:43 AM. Confirmed correct patient, procedure, site, and patient consented.   Anesthesia Topical anesthesia was used. Anesthetic medications included Lidocaine 4%, Proparacaine 0.5%.   Procedure Preparation included 5% betadine to ocular surface, eyelid speculum. A supplied needle was used.   Injection: 1.25 mg Bevacizumab 1.41m/0.05ml   Route: Intravitreal, Site: Left Eye   NDC:: 46962-952-84 Lot: [id number] , Expiration date: 08/16/2022   Post-op Post injection exam found visual acuity of at least counting fingers. The patient tolerated the procedure well. There were no complications. The patient received written and verbal post procedure care education. Post injection medications were not given.            ASSESSMENT/PLAN:    ICD-10-CM   1. Proliferative diabetic retinopathy of both eyes with macular edema associated with type 2 diabetes mellitus (HCC)  E11.3513 OCT, Retina - OU - Both Eyes    Intravitreal Injection, Pharmacologic Agent - OS - Left Eye    Bevacizumab (AVASTIN) SOLN 1.25 mg    2. Essential hypertension  I10     3. Hypertensive retinopathy of both eyes  H35.033     4. Combined forms of age-related cataract of both eyes  H25.813      Proliferative diabetic retinopathy OU - previously managed by Dr. BValetta Close-- lost to f/u since 2020 - s/p PRP OU and IVA OS x7 (BB) - s/p IVA OD #1 (01.02.24) - exam shows scattered MA OU; laser PRP OU - FA (01.02.23) shows OD: Cluster of leakage temporal macula, no NV; OS: Scattered focal NVE greatest inferior and temporal quads -- will need PRP fill in OS - OCT shows diabetic macular edema, both eyes (OD > OS) -- OD already improving - recommend IVA OS #1 today, 01.04.23 - pt wishes to proceed - RBA of procedure discussed, questions answered - IVA informed consent obtained and signed, 01.02.24 (OU) - see procedure note - f/u Jan 23 -- DFE, OCT, PRP OS  2,3. Hypertensive retinopathy OU - discussed  importance of tight BP control - monitor  4. Mixed Cataract OU - The symptoms of cataract, surgical options, and treatments and risks were discussed with patient. - discussed diagnosis and progression - monitor  Ophthalmic Meds Ordered this visit:  Meds ordered this encounter  Medications   Bevacizumab (AVASTIN) SOLN 1.25 mg     Return in about 19 days (around 08/22/2022) for f/u PDR OU, DFE, OCT.  There are no Patient Instructions on file for this visit.   Explained the diagnoses, plan, and follow up with the patient and they expressed understanding.  Patient expressed understanding of the importance of proper follow up care.   This document serves as a record of services personally performed by BGardiner Sleeper MD, PhD. It was created on their behalf by ASan Jetty BOwens Shark OA an ophthalmic technician.  The creation of this record is the provider's dictation and/or activities during the visit.    Electronically signed by: San Jetty. Marguerita Merles 01.03.2024 12:39 PM  Gardiner Sleeper, M.D., Ph.D. Diseases & Surgery of the Retina and Vitreous [facility]  I have reviewed the above documentation for accuracy and completeness, and I agree with the above. Gardiner Sleeper, M.D., Ph.D. 08/03/22 12:39 PM  Abbreviations: M myopia (nearsighted); A astigmatism; H hyperopia (farsighted); P presbyopia; Mrx spectacle prescription;  CTL contact lenses; OD right eye; OS left eye; OU both eyes  XT exotropia; ET esotropia; PEK punctate epithelial keratitis; PEE punctate epithelial erosions; DES dry eye syndrome; MGD meibomian gland dysfunction; ATs artificial tears; PFAT's preservative free artificial tears; [place] nuclear sclerotic cataract; PSC posterior subcapsular cataract; ERM epi-retinal membrane; PVD posterior vitreous detachment; RD retinal detachment; DM diabetes mellitus; DR diabetic retinopathy; NPDR non-proliferative diabetic retinopathy; PDR proliferative diabetic retinopathy;  CSME clinically significant macular edema; DME diabetic macular edema; dbh dot blot hemorrhages; CWS cotton wool spot; POAG primary open angle glaucoma; C/D cup-to-disc ratio; HVF humphrey visual field; GVF goldmann visual field; OCT optical coherence tomography; IOP intraocular pressure; BRVO Branch retinal vein occlusion; CRVO central retinal vein occlusion; CRAO central retinal artery occlusion; BRAO branch retinal artery occlusion; RT retinal tear; SB scleral buckle; PPV pars plana vitrectomy; VH Vitreous hemorrhage; PRP panretinal laser photocoagulation; IVK intravitreal kenalog; VMT vitreomacular traction; MH Macular hole;  NVD neovascularization of the disc; NVE neovascularization elsewhere; AREDS age related eye disease study; ARMD age related macular degeneration; POAG primary open angle glaucoma; EBMD epithelial/anterior basement membrane dystrophy; ACIOL anterior chamber intraocular lens; IOL intraocular lens; PCIOL posterior chamber intraocular lens; Phaco/IOL phacoemulsification with intraocular lens placement; [place] Proper photorefractive keratectomy; LASIK laser assisted in situ keratomileusis; HTN hypertension; DM diabetes mellitus; COPD chronic obstructive pulmonary disease

## 2022-08-03 ENCOUNTER — Encounter (INDEPENDENT_AMBULATORY_CARE_PROVIDER_SITE_OTHER): Payer: Self-pay | Admitting: Ophthalmology

## 2022-08-03 ENCOUNTER — Ambulatory Visit (INDEPENDENT_AMBULATORY_CARE_PROVIDER_SITE_OTHER): Payer: Medicare Other | Admitting: Ophthalmology

## 2022-08-03 DIAGNOSIS — I10 Essential (primary) hypertension: Secondary | ICD-10-CM | POA: Diagnosis not present

## 2022-08-03 DIAGNOSIS — H35033 Hypertensive retinopathy, bilateral: Secondary | ICD-10-CM | POA: Diagnosis not present

## 2022-08-03 DIAGNOSIS — H25813 Combined forms of age-related cataract, bilateral: Secondary | ICD-10-CM

## 2022-08-03 DIAGNOSIS — E113513 Type 2 diabetes mellitus with proliferative diabetic retinopathy with macular edema, bilateral: Secondary | ICD-10-CM | POA: Diagnosis not present

## 2022-08-03 MED ORDER — BEVACIZUMAB CHEMO INJECTION 1.25MG/0.05ML SYRINGE FOR KALEIDOSCOPE
1.2500 mg | INTRAVITREAL | Status: AC | PRN
  Administered 2022-08-03: 1.25 mg via INTRAVITREAL

## 2022-08-04 DIAGNOSIS — E1140 Type 2 diabetes mellitus with diabetic neuropathy, unspecified: Secondary | ICD-10-CM | POA: Diagnosis not present

## 2022-08-04 DIAGNOSIS — E039 Hypothyroidism, unspecified: Secondary | ICD-10-CM | POA: Diagnosis not present

## 2022-08-04 DIAGNOSIS — Z794 Long term (current) use of insulin: Secondary | ICD-10-CM | POA: Diagnosis not present

## 2022-08-04 DIAGNOSIS — I2510 Atherosclerotic heart disease of native coronary artery without angina pectoris: Secondary | ICD-10-CM | POA: Diagnosis not present

## 2022-08-04 DIAGNOSIS — M1460 Charcot's joint, unspecified site: Secondary | ICD-10-CM | POA: Diagnosis not present

## 2022-08-04 DIAGNOSIS — E109 Type 1 diabetes mellitus without complications: Secondary | ICD-10-CM | POA: Diagnosis not present

## 2022-08-04 DIAGNOSIS — E785 Hyperlipidemia, unspecified: Secondary | ICD-10-CM | POA: Diagnosis not present

## 2022-08-04 DIAGNOSIS — E11319 Type 2 diabetes mellitus with unspecified diabetic retinopathy without macular edema: Secondary | ICD-10-CM | POA: Diagnosis not present

## 2022-08-04 DIAGNOSIS — Z9641 Presence of insulin pump (external) (internal): Secondary | ICD-10-CM | POA: Diagnosis not present

## 2022-08-04 DIAGNOSIS — E118 Type 2 diabetes mellitus with unspecified complications: Secondary | ICD-10-CM | POA: Diagnosis not present

## 2022-08-04 DIAGNOSIS — E669 Obesity, unspecified: Secondary | ICD-10-CM | POA: Diagnosis not present

## 2022-08-04 DIAGNOSIS — I10 Essential (primary) hypertension: Secondary | ICD-10-CM | POA: Diagnosis not present

## 2022-08-09 ENCOUNTER — Ambulatory Visit (INDEPENDENT_AMBULATORY_CARE_PROVIDER_SITE_OTHER): Payer: Medicare Other

## 2022-08-09 DIAGNOSIS — J309 Allergic rhinitis, unspecified: Secondary | ICD-10-CM | POA: Diagnosis not present

## 2022-08-10 NOTE — Progress Notes (Signed)
[facility] Note  08/22/2022     CHIEF COMPLAINT Patient presents for Retina Evaluation   HISTORY OF PRESENT ILLNESS: Jodi Ray is a 62 y.o. female who presents to the clinic today for:   HPI     Retina Evaluation   In both eyes.  This started 19 days ago.  Duration of 19 days.  I, the attending physician,  performed the HPI with the patient and updated documentation appropriately.        Comments   19 day retina follow up PDR OU and PRP pt is reporting that her vision seems a lot better she denies any flashes or floaters       Last edited by Bernarda Caffey, MD on 08/23/2022  8:44 AM.    Pt is here for fill in [facility]  Referring physician: Monico Blitz, MD [place],  [place] [address]  HISTORICAL INFORMATION:   Selected notes from the MEDICAL RECORD NUMBER Referred by Dr. Mare Ferrari for concern of DME LEE:  Ocular Hx- PMH-    CURRENT MEDICATIONS: Current Outpatient Medications (Ophthalmic Drugs)  Medication Sig   prednisoLONE acetate (PRED FORTE) 1 % ophthalmic suspension Place 1 drop into the left eye 4 (four) times daily for 7 days.   No current facility-administered medications for this visit. (Ophthalmic Drugs)   Current Outpatient Medications (Other)  Medication Sig   aspirin EC 81 MG tablet Take 1 tablet (81 mg total) by mouth daily. Swallow whole.   atorvastatin (LIPITOR) 80 MG tablet TAKE 1 TABLET BY MOUTH ONCE DAILY AT  6PM   calcitRIOL (ROCALTROL) 0.25 MCG capsule Take 0.25 mcg by mouth every other day.   Continuous Blood Gluc Sensor (DEXCOM G6 SENSOR) MISC 1 Device by Other route as directed.   EPINEPHrine 0.3 mg/0.3 mL IJ SOAJ injection Inject 0.3 mg into the muscle as needed for anaphylaxis.   fish oil-omega-3 fatty acids 1000 MG capsule Take 1 g by mouth in the morning and at bedtime.   furosemide (LASIX) 20 MG tablet Take 20 mg by mouth. Take 2 tablets in the morning and 1 tablet in the evening   furosemide  (LASIX) 40 MG tablet Take 20-40 mg by mouth 2 (two) times daily. 40 MG AM, 20 MG PM   Glucagon (GVOKE HYPOPEN 2-PACK) 1 MG/0.2ML SOAJ    glucose blood ([employer] BLOOD GLUCOSE TEST) test strip    Insulin Human (INSULIN PUMP) SOLN Inject into the skin continuous.    Insulin Pen Needle (PEN NEEDLES 31GX5/16") 31G X 8 MM MISC as directed subcutaneous Twice a day for 30 days   JARDIANCE 10 MG TABS tablet Take 5 mg by mouth every other day.   KERENDIA 10 MG TABS Take 1 tablet by mouth daily.   levothyroxine (SYNTHROID) 112 MCG tablet Take 112 mcg by mouth daily before breakfast.   loratadine (CLARITIN) 10 MG tablet Take 2 tablets (20 mg total) by mouth daily.   metFORMIN (GLUCOPHAGE) 500 MG tablet Take 1 tablet (500 mg total) by mouth 2 (two) times daily with a meal.   nitroGLYCERIN (NITROSTAT) 0.4 MG SL tablet Place 1 tablet (0.4 mg total) under the tongue every 5 (five) minutes as needed for chest pain.   NOVOLOG 100 UNIT/ML injection Inject into the skin continuous. In insulin pump   pantoprazole (PROTONIX) 40 MG tablet Take 1 tablet (40 mg total) by mouth daily.   No current facility-administered medications for this visit. (Other)   REVIEW  OF SYSTEMS: ROS   Positive for: Endocrine, Eyes Last edited by Bernarda Caffey, MD on 08/22/2022 11:59 AM.     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 02/2018: s/p DES to Proximal LAD with residual 20% RCA stenosis.   Chronic pain    Diabetes mellitus without complication (HCC)    Headache    History of left bundle branch block (LBBB)    HTN (hypertension)    Hx of  cardiovascular stress test 10/2016   intermediate risk study   Hypothyroidism    Neuromuscular disorder ([place])    Neuropathy    Obesity    OSA on CPAP 03/10/2015   Renal insufficiency    Retinopathy    Sleep apnea    Varicose veins of both lower extremities    Past Surgical History:  Procedure Laterality Date   BIOPSY  11/14/2016   Procedure: BIOPSY;  Surgeon: Danie Binder, MD;  [facility]: [facility];  Service: Endoscopy;;  colon gastric duodenal   BREAST EXCISIONAL BIOPSY Right    BREAST SURGERY Left 1994   Lumpectomy   COLONOSCOPY WITH PROPOFOL N/A 11/14/2016   Dr. Oneida Alar: Moderately redundant rectosigmoid colon. Random colon biopsies benign. Internal hemorrhoids. Surveillance colonoscopy in 5 years.   COLONOSCOPY WITH PROPOFOL N/A 04/20/2022   Procedure: COLONOSCOPY WITH PROPOFOL;  Surgeon: Eloise Harman, DO;  Location: [facility];  Service: Endoscopy;  Laterality: N/A;  12:30am   CORONARY STENT INTERVENTION N/A 03/22/2018   Procedure: CORONARY STENT INTERVENTION;  Surgeon: Burnell Blanks, MD;  Location: [facility];  Service: Cardiovascular;  Laterality: N/A;   ESOPHAGOGASTRODUODENOSCOPY (EGD) WITH PROPOFOL N/A 11/14/2016   Dr. Oneida Alar: Esophagus normal. Moderate gastritis, few gastric polyps. Duodenal biopsies negative. Fundic gland polyp gastric polyp area no H pylori.   FOOT SURGERY     LEFT HEART CATH AND CORONARY ANGIOGRAPHY N/A 03/22/2018   Procedure: LEFT HEART CATH AND CORONARY ANGIOGRAPHY;  Surgeon: Burnell Blanks, MD;  Location: [facility];  Service: Cardiovascular;  Laterality: N/A;   LEG SURGERY Right    POLYPECTOMY  04/20/2022   Procedure: POLYPECTOMY;  Surgeon: Eloise Harman, DO;  [facility]: [facility];  Service: Endoscopy;;   REPLACEMENT TOTAL KNEE Right    SPINAL CORD STIMULATOR IMPLANT     FAMILY HISTORY Family History  Problem Relation Age of Onset   Cancer Mother 27       colon   Cancer Father         renal cell   Colon cancer Father    Breast cancer Sister    Cancer Sister        primary brain, and primary breast too   Breast cancer Sister    Cancer Sister        breast    Breast cancer Sister    Breast cancer Sister    Cancer Brother        kidney and liver   Sleep apnea Neg Hx    SOCIAL HISTORY Social History   Tobacco Use  Smoking status: Never   Smokeless tobacco: Never  Vaping Use   Vaping Use: Never used  Substance Use Topics   Alcohol use: No    Alcohol/week: 0.0 standard drinks of alcohol   Drug use: No       OPHTHALMIC EXAM:  Base Eye Exam     Visual Acuity (Snellen - Linear)       Right Left   Dist cc 20/30 -2 20/25 -2   Dist ph cc NI NI    Correction: Glasses         Tonometry (Tonopen, 9:39 AM)       Right Left   Pressure 16 14         Pupils       Pupils Dark Light Shape React APD   Right PERRL 3 2 Round Brisk None   Left PERRL 3 2 Round Brisk None         Visual Fields       Left Right    Full Full         Extraocular Movement       Right Left    Full, Ortho Full, Ortho         Neuro/Psych     Oriented x3: Yes   Mood/Affect: Normal         Dilation     Both eyes: 2.5% Phenylephrine @ 9:39 AM           Slit Lamp and Fundus Exam     Slit Lamp Exam       Right Left   Lids/Lashes Dermatochalasis - upper lid, mild MGD Dermatochalasis - upper lid, mild MGD   Conjunctiva/Sclera nasal pingeucula nasal pingeucula   Cornea arcus, mild tear film debris arcus, mild tear film debris   Anterior Chamber deep and clear, no cell or flare deep and clear, no cell or flare   Iris Round and dilated, No NVI Round and dilated, No NVI   Lens 2+ Nuclear sclerosis, 2-3+ Cortical cataract, 1+ Posterior subcapsular cataract 2+ Nuclear sclerosis, 2-3+ Cortical cataract, 1+ Posterior subcapsular cataract   Anterior Vitreous Vitreous syneresis Vitreous syneresis         Fundus Exam       Right Left   Disc Pink and  Sharp, no NVD, temporal PPA Pink and Sharp, no NVD, temporal PPP   C/D Ratio 0.4 0.4   Macula Blunted foveal reflex, +cystic changes, scattered MA Flat, Blunted foveal reflex, scattered MA   Vessels attenuated, mild tortuosity attenuated, Tortuous, +NV   Periphery Attached, dense 360 PRP extending posteriorly to arcades Attached, 360 PRP with room for fill in           Refraction     Wearing Rx       Sphere Cylinder Axis Add   Right +1.75 +1.50 124 +2.50   Left +4.25 +2.25 001 +2.50           IMAGING AND PROCEDURES  Imaging and Procedures for 08/22/2022  OCT, Retina - OU - Both Eyes       Right Eye Quality was good. Central Foveal Thickness: 346. Progression has improved. Findings include no SRF, abnormal foveal contour, intraretinal fluid (Mild interval improvement in +DME temporal macula and centrally).   Left Eye Quality was good. Central Foveal Thickness: 297. Progression has been stable. Findings include normal foveal contour, no IRF, no SRF (Trace cystic changes temporal macula -- stable).   Notes *Images captured and stored on drive  Diagnosis /  Impression:  OD: interval improvement +DME temporal macula and centrally OS: NFP; no IRF/SRF centrally; tr cystic changes temporal mac -- stable  Clinical management:  See below  Abbreviations: NFP - Normal foveal profile. CME - cystoid macular edema. PED - pigment epithelial detachment. IRF - intraretinal fluid. SRF - subretinal fluid. EZ - ellipsoid zone. ERM - epiretinal membrane. ORA - outer retinal atrophy. ORT - outer retinal tubulation. SRHM - subretinal hyper-reflective material. IRHM - intraretinal hyper-reflective material      Panretinal Photocoagulation - OS - Left Eye       LASER PROCEDURE NOTE  Diagnosis:   Proliferative Diabetic Retinopathy, LEFT EYE  Procedure:  Pan-retinal photocoagulation using slit lamp laser, LEFT EYE, fill-in  Anesthesia:  Topical, subconjunctival block -- 2%  lidocaine  Surgeon: Bernarda Caffey, MD, PhD   Informed consent obtained, operative eye marked, and time out performed prior to initiation of laser.   Lumenis Smart532 slit lamp laser Pattern: 2x2 square, 3x3 square Power: 360 mW Duration: 40 msec  Spot size: 200 microns  # spots: 977 spots fill-in  Complications: None.  Notes: significant cortical obscuring view and preventing laser up take in scattered focal areas  RTC: Feb 1 -- DFE/OCT, possible injxns  Patient tolerated the procedure well and received written and verbal post-procedure care information/education.            ASSESSMENT/PLAN:    ICD-10-CM   1. Proliferative diabetic retinopathy of both eyes with macular edema associated with type 2 diabetes mellitus (HCC)  E11.3513 OCT, Retina - OU - Both Eyes    Panretinal Photocoagulation - OS - Left Eye    2. Essential hypertension  I10     3. Hypertensive retinopathy of both eyes  H35.033     4. Combined forms of age-related cataract of both eyes  H25.813      Proliferative diabetic retinopathy OU - previously managed by Dr. Valetta Close -- lost to f/u since 2020 - s/p PRP OU and IVA OS x7 (BB) - s/p IVA OD #1 (01.02.24) - s/p IVA OS #1 (01.04.24) - exam shows scattered MA OU; laser PRP OU - FA (01.02.23) shows OD: Cluster of leakage temporal macula, no NV; OS: Scattered focal NVE greatest inferior and temporal quads -- will need PRP fill in OS - OCT shows OD: interval improvement +DME temporal macula and centrally; OS: NFP; no IRF/SRF centrally; tr cystic changes temporal mac -- stable - recommend PRP OS today, 01.23.24 - pt wishes to proceed with laser - RBA of procedure discussed, questions answered - start PF QID OS x7 days - see procedure note -- required subconjuncatival block, difficult views due to cortical cataract - IVA informed consent obtained and signed, 01.02.24 (OU) - f/u Feb.1 or later -- DFE, OCT  2,3. Hypertensive retinopathy OU - discussed  importance of tight BP control - monitor  4. Mixed Cataract OU - The symptoms of cataract, surgical options, and treatments and risks were discussed with patient. - discussed diagnosis and progression - monitor  Ophthalmic Meds Ordered this visit:  Meds ordered this encounter  Medications   prednisoLONE acetate (PRED FORTE) 1 % ophthalmic suspension    Sig: Place 1 drop into the left eye 4 (four) times daily for 7 days.    Dispense:  10 mL    Refill:  0     Return in about 9 days (around 08/31/2022) for PDR OU, Dilated Exam, OCT, Possible Injxns.  There are no Patient Instructions on file for this visit.  Explained the diagnoses, plan, and follow up with the patient and they expressed understanding.  Patient expressed understanding of the importance of proper follow up care.   This document serves as a record of services personally performed by Gardiner Sleeper, MD, PhD. It was created on their behalf by Renaldo Reel, [place] an ophthalmic technician. The creation of this record is the provider's dictation and/or activities during the visit.    Electronically signed by:  Renaldo Reel, COT  1.11.24 8:45 AM  This document serves as a record of services personally performed by Gardiner Sleeper, MD, PhD. It was created on their behalf by San Jetty. Owens Shark, OA an ophthalmic technician. The creation of this record is the provider's dictation and/or activities during the visit.    Electronically signed by: San Jetty. Owens Shark, [place] 01.23.2024 8:45 AM  Gardiner Sleeper, M.D., Ph.D. Diseases & Surgery of the Retina and Vitreous [facility]  I have reviewed the above documentation for accuracy and completeness, and I agree with the above. Gardiner Sleeper, M.D., Ph.D. 08/23/22 8:48 AM   Abbreviations: M myopia (nearsighted); A astigmatism; H hyperopia (farsighted); P presbyopia; Mrx spectacle prescription;  CTL contact lenses; OD right eye; OS left eye; OU both eyes   XT exotropia; ET esotropia; PEK punctate epithelial keratitis; PEE punctate epithelial erosions; DES dry eye syndrome; MGD meibomian gland dysfunction; ATs artificial tears; PFAT's preservative free artificial tears; [place] nuclear sclerotic cataract; PSC posterior subcapsular cataract; ERM epi-retinal membrane; PVD posterior vitreous detachment; RD retinal detachment; DM diabetes mellitus; DR diabetic retinopathy; NPDR non-proliferative diabetic retinopathy; PDR proliferative diabetic retinopathy; CSME clinically significant macular edema; DME diabetic macular edema; dbh dot blot hemorrhages; CWS cotton wool spot; POAG primary open angle glaucoma; C/D cup-to-disc ratio; HVF humphrey visual field; GVF goldmann visual field; OCT optical coherence tomography; IOP intraocular pressure; BRVO Branch retinal vein occlusion; CRVO central retinal vein occlusion; CRAO central retinal artery occlusion; BRAO branch retinal artery occlusion; RT retinal tear; SB scleral buckle; PPV pars plana vitrectomy; VH Vitreous hemorrhage; PRP panretinal laser photocoagulation; IVK intravitreal kenalog; VMT vitreomacular traction; MH Macular hole;  NVD neovascularization of the disc; NVE neovascularization elsewhere; AREDS age related eye disease study; ARMD age related macular degeneration; POAG primary open angle glaucoma; EBMD epithelial/anterior basement membrane dystrophy; ACIOL anterior chamber intraocular lens; IOL intraocular lens; PCIOL posterior chamber intraocular lens; Phaco/IOL phacoemulsification with intraocular lens placement; [place] photorefractive keratectomy; LASIK laser assisted in situ keratomileusis; HTN hypertension; DM diabetes mellitus; COPD chronic obstructive pulmonary disease

## 2022-08-16 ENCOUNTER — Ambulatory Visit (INDEPENDENT_AMBULATORY_CARE_PROVIDER_SITE_OTHER): Payer: Medicare Other

## 2022-08-16 DIAGNOSIS — J309 Allergic rhinitis, unspecified: Secondary | ICD-10-CM

## 2022-08-18 ENCOUNTER — Other Ambulatory Visit: Payer: Self-pay | Admitting: Cardiology

## 2022-08-22 ENCOUNTER — Encounter (INDEPENDENT_AMBULATORY_CARE_PROVIDER_SITE_OTHER): Payer: Self-pay | Admitting: Ophthalmology

## 2022-08-22 ENCOUNTER — Ambulatory Visit (INDEPENDENT_AMBULATORY_CARE_PROVIDER_SITE_OTHER): Payer: Medicare Other | Admitting: Ophthalmology

## 2022-08-22 DIAGNOSIS — E113513 Type 2 diabetes mellitus with proliferative diabetic retinopathy with macular edema, bilateral: Secondary | ICD-10-CM | POA: Diagnosis not present

## 2022-08-22 DIAGNOSIS — I10 Essential (primary) hypertension: Secondary | ICD-10-CM | POA: Diagnosis not present

## 2022-08-22 DIAGNOSIS — H35033 Hypertensive retinopathy, bilateral: Secondary | ICD-10-CM

## 2022-08-22 DIAGNOSIS — H25813 Combined forms of age-related cataract, bilateral: Secondary | ICD-10-CM

## 2022-08-22 MED ORDER — PREDNISOLONE ACETATE 1 % OP SUSP: 1.0000 [drp] | Freq: Four times a day (QID) | OPHTHALMIC | 0 refills | Status: AC

## 2022-08-23 ENCOUNTER — Ambulatory Visit (INDEPENDENT_AMBULATORY_CARE_PROVIDER_SITE_OTHER): Payer: Medicare Other

## 2022-08-23 DIAGNOSIS — J309 Allergic rhinitis, unspecified: Secondary | ICD-10-CM

## 2022-08-24 DIAGNOSIS — H6121 Impacted cerumen, right ear: Secondary | ICD-10-CM | POA: Diagnosis not present

## 2022-08-24 DIAGNOSIS — R42 Dizziness and giddiness: Secondary | ICD-10-CM | POA: Diagnosis not present

## 2022-08-24 DIAGNOSIS — H903 Sensorineural hearing loss, bilateral: Secondary | ICD-10-CM | POA: Diagnosis not present

## 2022-08-24 DIAGNOSIS — H838X3 Other specified diseases of inner ear, bilateral: Secondary | ICD-10-CM | POA: Diagnosis not present

## 2022-08-29 ENCOUNTER — Ambulatory Visit
Admission: RE | Admit: 2022-08-29 | Discharge: 2022-08-29 | Disposition: A | Discharge status: Home / Self Care | Payer: Medicare Other | Source: Ambulatory Visit | Attending: Neurology | Admitting: Neurology

## 2022-08-29 DIAGNOSIS — I6523 Occlusion and stenosis of bilateral carotid arteries: Secondary | ICD-10-CM | POA: Diagnosis not present

## 2022-08-29 DIAGNOSIS — R42 Dizziness and giddiness: Secondary | ICD-10-CM | POA: Diagnosis not present

## 2022-08-29 MED ORDER — IOPAMIDOL (ISOVUE-370) INJECTION 76%
75.0000 mL | Freq: Once | INTRAVENOUS | Status: AC | PRN
  Administered 2022-08-29: 75 mL via INTRAVENOUS

## 2022-08-30 ENCOUNTER — Ambulatory Visit (INDEPENDENT_AMBULATORY_CARE_PROVIDER_SITE_OTHER): Payer: Medicare Other

## 2022-08-30 DIAGNOSIS — J309 Allergic rhinitis, unspecified: Secondary | ICD-10-CM

## 2022-08-31 NOTE — Progress Notes (Signed)
[facility] Note  09/01/2022     CHIEF COMPLAINT Patient presents for Retina Follow Up   HISTORY OF PRESENT ILLNESS: Jodi Ray is a 62 y.o. female who presents to the clinic today for:   HPI     Retina Follow Up   Patient presents with  Diabetic Retinopathy.  In both eyes.  This started 1 week ago.  Duration of 1 week.  Since onset it is stable.  I, the attending physician,  performed the HPI with the patient and updated documentation appropriately.        Comments   1 week retina follow up PRP OS pt is reporting she is still having some pain in the left eye her vision seems little blurred she finished her gtts as directed       Last edited by Bernarda Caffey, MD on 09/01/2022  9:15 AM.      Referring physician: Monico Blitz, MD [place],   [id number]  HISTORICAL INFORMATION:   Selected notes from the MEDICAL RECORD NUMBER Referred by Dr. Mare Ferrari for concern of DME LEE:  Ocular Hx- PMH-    CURRENT MEDICATIONS: No current outpatient medications on file. (Ophthalmic Drugs)   No current facility-administered medications for this visit. (Ophthalmic Drugs)   Current Outpatient Medications (Other)  Medication Sig   aspirin EC 81 MG tablet Take 1 tablet (81 mg total) by mouth daily. Swallow whole.   atorvastatin (LIPITOR) 80 MG tablet TAKE 1 TABLET BY MOUTH ONCE DAILY AT  6PM   calcitRIOL (ROCALTROL) 0.25 MCG capsule Take 0.25 mcg by mouth every other day.   Continuous Blood Gluc Sensor (DEXCOM G6 SENSOR) MISC 1 Device by Other route as directed.   EPINEPHrine 0.3 mg/0.3 mL IJ SOAJ injection Inject 0.3 mg into the muscle as needed for anaphylaxis.   fish oil-omega-3 fatty acids 1000 MG capsule Take 1 g by mouth in the morning and at bedtime.   furosemide (LASIX) 20 MG tablet Take 20 mg by mouth. Take 2 tablets in the morning and 1 tablet in the evening   furosemide (LASIX) 40 MG tablet Take 20-40 mg by mouth 2 (two) times  daily. 40 MG AM, 20 MG PM   Glucagon (GVOKE HYPOPEN 2-PACK) 1 MG/0.2ML SOAJ    glucose blood ([employer] BLOOD GLUCOSE TEST) test strip    Insulin Human (INSULIN PUMP) SOLN Inject into the skin continuous.    Insulin Pen Needle (PEN NEEDLES 31GX5/16") 31G X 8 MM MISC as directed subcutaneous Twice a day for 30 days   JARDIANCE 10 MG TABS tablet Take 5 mg by mouth every other day.   KERENDIA 10 MG TABS Take 1 tablet by mouth daily.   levothyroxine (SYNTHROID) 112 MCG tablet Take 112 mcg by mouth daily before breakfast.   loratadine (CLARITIN) 10 MG tablet Take 2 tablets (20 mg total) by mouth daily.   metFORMIN (GLUCOPHAGE) 500 MG tablet Take 1 tablet (500 mg total) by mouth 2 (two) times daily with a meal.   nitroGLYCERIN (NITROSTAT) 0.4 MG SL tablet Place 1 tablet (0.4 mg total) under the tongue every 5 (five) minutes as needed for chest pain.   NOVOLOG 100 UNIT/ML injection Inject into the skin continuous. In insulin pump   pantoprazole (PROTONIX) 40 MG tablet Take 1 tablet (40 mg total) by mouth daily.   No current facility-administered medications for this visit. (Other)   REVIEW OF SYSTEMS: ROS   Positive for: Endocrine, Eyes Last  edited by Parthenia Ames, COT on 09/01/2022  8:09 AM.     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 02/2018: s/p DES to Proximal LAD with residual 20% RCA stenosis.   Chronic pain    Diabetes mellitus without complication (HCC)    Headache    History of left bundle branch block (LBBB)    HTN (hypertension)    Hx of cardiovascular stress test 10/2016   intermediate risk  study   Hypothyroidism    Neuromuscular disorder ([place])    Neuropathy    Obesity    OSA on CPAP 03/10/2015   Renal insufficiency    Retinopathy    Sleep apnea    Varicose veins of both lower extremities    Past Surgical History:  Procedure Laterality Date   BIOPSY  11/14/2016   Procedure: BIOPSY;  Surgeon: Danie Binder, MD;  [facility]: [facility];  Service: Endoscopy;;  colon gastric duodenal   BREAST EXCISIONAL BIOPSY Right    BREAST SURGERY Left 1994   Lumpectomy   COLONOSCOPY WITH PROPOFOL N/A 11/14/2016   Dr. Oneida Alar: Moderately redundant rectosigmoid colon. Random colon biopsies benign. Internal hemorrhoids. Surveillance colonoscopy in 5 years.   COLONOSCOPY WITH PROPOFOL N/A 04/20/2022   Procedure: COLONOSCOPY WITH PROPOFOL;  Surgeon: Eloise Harman, DO;  [facility]: [facility];  Service: Endoscopy;  Laterality: N/A;  12:30am   CORONARY STENT INTERVENTION N/A 03/22/2018   Procedure: CORONARY STENT INTERVENTION;  Surgeon: Burnell Blanks, MD;  Location: [facility];  Service: Cardiovascular;  Laterality: N/A;   ESOPHAGOGASTRODUODENOSCOPY (EGD) WITH PROPOFOL N/A 11/14/2016   Dr. Oneida Alar: Esophagus normal. Moderate gastritis, few gastric polyps. Duodenal biopsies negative. Fundic gland polyp gastric polyp area no H pylori.   FOOT SURGERY     LEFT HEART CATH AND CORONARY ANGIOGRAPHY N/A 03/22/2018   Procedure: LEFT HEART CATH AND CORONARY ANGIOGRAPHY;  Surgeon: Burnell Blanks, MD;  Location: [facility];  Service: Cardiovascular;  Laterality: N/A;   LEG SURGERY Right    POLYPECTOMY  04/20/2022   Procedure: POLYPECTOMY;  Surgeon: Eloise Harman, DO;  [facility];  Service: Endoscopy;;   REPLACEMENT TOTAL KNEE Right    SPINAL CORD STIMULATOR IMPLANT     FAMILY HISTORY Family History  Problem Relation Age of Onset   Cancer Mother 67       colon   Cancer Father        renal cell   Colon cancer Father    Breast cancer Sister     Cancer Sister        primary brain, and primary breast too   Breast cancer Sister    Cancer Sister        breast    Breast cancer Sister    Breast cancer Sister    Cancer Brother        kidney and liver   Sleep apnea Neg Hx    SOCIAL HISTORY Social History   Tobacco Use   Smoking status: Never   Smokeless tobacco: Never  Vaping Use   Vaping Use: Never used  Substance Use Topics   Alcohol use: No    Alcohol/week: 0.0 standard drinks of alcohol   Drug use: No       OPHTHALMIC EXAM:  Base Eye Exam     Visual Acuity (Snellen - Linear)       Right Left   Dist cc 20/30 -1 20/50   Dist ph cc NI 20/25 -1    Correction: Glasses         Tonometry (Tonopen, 8:13 AM)       Right Left   Pressure 14 15         Pupils       Pupils Dark Light Shape React APD   Right PERRL 3 2 Round Brisk None   Left PERRL 3 2 Round Brisk None         Visual Fields       Left Right    Full Full         Extraocular Movement       Right Left    Full, Ortho Full, Ortho         Neuro/Psych     Oriented x3: Yes   Mood/Affect: Normal         Dilation     Both eyes: 2.5% Phenylephrine @ 8:13 AM           Slit Lamp and Fundus Exam     Slit Lamp Exam       Right Left   Lids/Lashes Dermatochalasis - upper lid, mild MGD Dermatochalasis - upper lid, mild MGD   Conjunctiva/Sclera nasal pingeucula nasal pingeucula, Subconjunctival hemorrhage - improving   Cornea arcus, mild tear film debris arcus, mild tear film debris   Anterior Chamber deep and clear, no cell or flare deep and clear, no cell or flare   Iris Round and dilated, No NVI Round and dilated, No NVI   Lens 2+ Nuclear sclerosis, 2-3+ Cortical cataract, 1+ Posterior subcapsular cataract 2+ Nuclear sclerosis, 2-3+ Cortical cataract, 1+ Posterior subcapsular cataract   Anterior Vitreous Vitreous syneresis Vitreous syneresis         Fundus Exam       Right Left   Disc Pink and Sharp, no NVD,  temporal PPA Pink and Sharp, no NVD, temporal PPP   C/D Ratio 0.4 0.4   Macula Blunted foveal reflex, +cystic changes - improved, scattered MA Flat, Blunted foveal reflex, scattered MA   Vessels attenuated, Tortuous attenuated, Tortuous, +NV   Periphery Attached, dense 360 PRP extending posteriorly to arcades Attached, 360 PRP with good posterior fill in           Refraction     Wearing Rx       Sphere Cylinder Axis Add   Right +1.75 +1.50 124 +2.50   Left +4.25 +2.25 001 +2.50           IMAGING AND PROCEDURES  Imaging and Procedures for 09/01/2022  OCT, Retina - OU - Both Eyes       Right Eye Quality was good. Central Foveal Thickness: 338. Progression has improved. Findings include no SRF, abnormal foveal contour, intraretinal fluid (Mild interval improvement in cystic changes temporal macula and centrally).   Left Eye Quality was good. Central Foveal Thickness: 307. Progression has worsened. Findings include normal foveal contour, no SRF, intraretinal fluid (Mild interval increase in cystic changes temporal macula).   Notes *Images captured and stored on drive  Diagnosis / Impression:  +  DME OU OD: Mild interval improvement in cystic changes temporal macula and centrally OS: Mild interval increase in cystic changes temporal macula  Clinical management:  See below  Abbreviations: NFP - Normal foveal profile. CME - cystoid macular edema. PED - pigment epithelial detachment. IRF - intraretinal fluid. SRF - subretinal fluid. EZ - ellipsoid zone. ERM - epiretinal membrane. ORA - outer retinal atrophy. ORT - outer retinal tubulation. SRHM - subretinal hyper-reflective material. IRHM - intraretinal hyper-reflective material      Intravitreal Injection, Pharmacologic Agent - OD - Right Eye       Time Out 09/01/2022. 9:02 AM. Confirmed correct patient, procedure, site, and patient consented.   Anesthesia Topical anesthesia was used. Anesthetic medications included  Lidocaine 2%, Proparacaine 0.5%.   Procedure Preparation included 5% betadine to ocular surface, eyelid speculum. A supplied needle was used.   Injection: 1.25 mg Bevacizumab 1.46m/0.05ml   Route: Intravitreal, Site: Right Eye   NDC:: 14481-856-31 Lot:: [id number] Expiration date: 10/17/2022   Post-op Post injection exam found visual acuity of at least counting fingers. The patient tolerated the procedure well. There were no complications. The patient received written and verbal post procedure care education.      Intravitreal Injection, Pharmacologic Agent - OS - Left Eye       Time Out 09/01/2022. 9:02 AM. Confirmed correct patient, procedure, site, and patient consented.   Anesthesia Topical anesthesia was used. Anesthetic medications included Lidocaine 4%, Proparacaine 0.5%.   Procedure Preparation included 5% betadine to ocular surface, eyelid speculum. A (32g) needle was used.   Injection: 1.25 mg Bevacizumab 1.239m0.05ml   Route: Intravitreal, Site: Left Eye   NDC: : 78588-502-77Lot: : [id number] date: 09/29/2022   Post-op Post injection exam found visual acuity of at least counting fingers. The patient tolerated the procedure well. There were no complications. The patient received written and verbal post procedure care education. Post injection medications were not given.            ASSESSMENT/PLAN:    ICD-10-CM   1. Proliferative diabetic retinopathy of both eyes with macular edema associated with type 2 diabetes mellitus (HCC)  E11.3513 OCT, Retina - OU - Both Eyes    Intravitreal Injection, Pharmacologic Agent - OD - Right Eye    Intravitreal Injection, Pharmacologic Agent - OS - Left Eye    Bevacizumab (AVASTIN) SOLN 1.25 mg    Bevacizumab (AVASTIN) SOLN 1.25 mg    2. Essential hypertension  I10     3. Hypertensive retinopathy of both eyes  H35.033     4. Combined forms of age-related cataract of both eyes  H25.813       Proliferative diabetic  retinopathy OU - previously managed by Dr. BoValetta Close- lost to f/u since 2020 - s/p PRP OU and IVA OS x7 (BB) - s/p IVA OD #1 (01.02.24) - s/p IVA OS #1 (01.04.24) - s/p PRP fill in OS (01.23.24) - exam shows scattered MA OU; laser PRP OU - FA (01.02.23) shows OD: Cluster of leakage temporal macula, no NV; OS: Scattered focal NVE greatest inferior and temporal quads -- will need PRP fill in OS - OCT shows OD: Mild interval improvement in cystic changes temporal macula and centrally; OS: Mild interval increase in cystic changes temporal macula - recommend IVA OU #2 today, 02.02.24 - pt wishes to proceed with injections - RBA of procedure discussed, questions answered - see procedure note - IVA informed consent obtained and signed, 01.02.24 (OU) - f/u 4 weeks -- DFE,  OCT  2,3. Hypertensive retinopathy OU - discussed importance of tight BP control - monitor  4. Mixed Cataract OU - The symptoms of cataract, surgical options, and treatments and risks were discussed with patient. - discussed diagnosis and progression - monitor  Ophthalmic Meds Ordered this visit:  Meds ordered this encounter  Medications   Bevacizumab (AVASTIN) SOLN 1.25 mg   Bevacizumab (AVASTIN) SOLN 1.25 mg     Return in about 4 weeks (around 09/29/2022) for f/u PDR OU, DFE, OCT, possible injxn(s).  There are no Patient Instructions on file for this visit.   Explained the diagnoses, plan, and follow up with the patient and they expressed understanding.  Patient expressed understanding of the importance of proper follow up care.     This document serves as a record of services personally performed by Gardiner Sleeper, MD, PhD. It was created on their behalf by Joetta Manners COT, an ophthalmic technician. The creation of this record is the provider's dictation and/or activities during the visit.    Electronically signed by: Joetta Manners COT 08/31/2022 11:35 PM  This document serves as a record of  services personally performed by Gardiner Sleeper, MD, PhD. It was created on their behalf by San Jetty. Owens Shark, OA an ophthalmic technician. The creation of this record is the provider's dictation and/or activities during the visit.    Electronically signed by: San Jetty. Marguerita Merles 02.02.2024 11:35 PM  Gardiner Sleeper, M.D., Ph.D. Diseases & Surgery of the Retina and [place] 09/01/2022   I have reviewed the above documentation for accuracy and completeness, and I agree with the above. Gardiner Sleeper, M.D., Ph.D. 09/02/22 11:52 PM  Abbreviations: M myopia (nearsighted); A astigmatism; H hyperopia (farsighted); P presbyopia; Mrx spectacle prescription;  CTL contact lenses; OD right eye; OS left eye; OU both eyes  XT exotropia; ET esotropia; PEK punctate epithelial keratitis; PEE punctate epithelial erosions; DES dry eye syndrome; MGD meibomian gland dysfunction; ATs artificial tears; PFAT's preservative free artificial tears; [place] nuclear sclerotic cataract; PSC posterior subcapsular cataract; ERM epi-retinal membrane; PVD posterior vitreous detachment; RD retinal detachment; DM diabetes mellitus; DR diabetic retinopathy; NPDR non-proliferative diabetic retinopathy; PDR proliferative diabetic retinopathy; CSME clinically significant macular edema; DME diabetic macular edema; dbh dot blot hemorrhages; CWS cotton wool spot; POAG primary open angle glaucoma; C/D cup-to-disc ratio; HVF humphrey visual field; GVF goldmann visual field; OCT optical coherence tomography; IOP intraocular pressure; BRVO Branch retinal vein occlusion; CRVO central retinal vein occlusion; CRAO central retinal artery occlusion; BRAO branch retinal artery occlusion; RT retinal tear; SB scleral buckle; PPV pars plana vitrectomy; VH Vitreous hemorrhage; PRP panretinal laser photocoagulation; IVK intravitreal kenalog; VMT vitreomacular traction; MH Macular hole;  NVD neovascularization of the disc; NVE  neovascularization elsewhere; AREDS age related eye disease study; ARMD age related macular degeneration; POAG primary open angle glaucoma; EBMD epithelial/anterior basement membrane dystrophy; ACIOL anterior chamber intraocular lens; IOL intraocular lens; PCIOL posterior chamber intraocular lens; Phaco/IOL phacoemulsification with intraocular lens placement; [place] photorefractive keratectomy; LASIK laser assisted in situ keratomileusis; HTN hypertension; DM diabetes mellitus; COPD chronic obstructive pulmonary disease

## 2022-09-01 ENCOUNTER — Encounter (INDEPENDENT_AMBULATORY_CARE_PROVIDER_SITE_OTHER): Payer: Self-pay | Admitting: Ophthalmology

## 2022-09-01 ENCOUNTER — Ambulatory Visit (INDEPENDENT_AMBULATORY_CARE_PROVIDER_SITE_OTHER): Payer: Medicare Other | Admitting: Ophthalmology

## 2022-09-01 DIAGNOSIS — E113513 Type 2 diabetes mellitus with proliferative diabetic retinopathy with macular edema, bilateral: Secondary | ICD-10-CM

## 2022-09-01 DIAGNOSIS — H25813 Combined forms of age-related cataract, bilateral: Secondary | ICD-10-CM

## 2022-09-01 DIAGNOSIS — I10 Essential (primary) hypertension: Secondary | ICD-10-CM | POA: Diagnosis not present

## 2022-09-01 DIAGNOSIS — H35033 Hypertensive retinopathy, bilateral: Secondary | ICD-10-CM

## 2022-09-01 MED ORDER — BEVACIZUMAB CHEMO INJECTION 1.25MG/0.05ML SYRINGE FOR KALEIDOSCOPE
1.2500 mg | INTRAVITREAL | Status: AC | PRN
  Administered 2022-09-01: 1.25 mg via INTRAVITREAL

## 2022-09-06 ENCOUNTER — Ambulatory Visit (INDEPENDENT_AMBULATORY_CARE_PROVIDER_SITE_OTHER): Payer: Medicare Other

## 2022-09-06 DIAGNOSIS — J309 Allergic rhinitis, unspecified: Secondary | ICD-10-CM | POA: Diagnosis not present

## 2022-09-07 DIAGNOSIS — R42 Dizziness and giddiness: Secondary | ICD-10-CM | POA: Diagnosis not present

## 2022-09-12 DIAGNOSIS — E1151 Type 2 diabetes mellitus with diabetic peripheral angiopathy without gangrene: Secondary | ICD-10-CM | POA: Diagnosis not present

## 2022-09-12 DIAGNOSIS — E1140 Type 2 diabetes mellitus with diabetic neuropathy, unspecified: Secondary | ICD-10-CM | POA: Diagnosis not present

## 2022-09-13 ENCOUNTER — Ambulatory Visit (INDEPENDENT_AMBULATORY_CARE_PROVIDER_SITE_OTHER): Payer: Medicare Other

## 2022-09-13 DIAGNOSIS — J309 Allergic rhinitis, unspecified: Secondary | ICD-10-CM | POA: Diagnosis not present

## 2022-09-14 DIAGNOSIS — H832X1 Labyrinthine dysfunction, right ear: Secondary | ICD-10-CM | POA: Diagnosis not present

## 2022-09-14 DIAGNOSIS — R42 Dizziness and giddiness: Secondary | ICD-10-CM | POA: Diagnosis not present

## 2022-09-20 ENCOUNTER — Ambulatory Visit (INDEPENDENT_AMBULATORY_CARE_PROVIDER_SITE_OTHER): Payer: Medicare Other

## 2022-09-20 DIAGNOSIS — J309 Allergic rhinitis, unspecified: Secondary | ICD-10-CM | POA: Diagnosis not present

## 2022-09-22 NOTE — Therapy (Addendum)
OUTPATIENT PHYSICAL THERAPY VESTIBULAR EVALUATION     Patient Name: Jodi Ray MRN: [id number] DOB:1960/12/20, 62 y.o., female Today's Date: 09/25/2022  END OF SESSION:  PT End of Session - 09/25/22 0815     Visit Number 1    Number of Visits 8    Date for PT Re-Evaluation 10/23/22    Authorization Type Medicare Part A; [employer]er, multimedia employee    Authorization Time Period 80/20    Progress Note Due on Visit 10    PT Start Time 0815    PT Stop Time 0900    PT Time Calculation (min) 45 min    Activity Tolerance Patient tolerated treatment well    Behavior During Therapy WFL for tasks assessed/performed             Past Medical History:  Diagnosis Date   Adult RDS ([place])    Anemia    Brain aneurysm    frontal lobe   CAD (coronary artery disease)    a. 02/2018: s/p DES to Proximal LAD with residual 20% RCA stenosis.   Chronic pain    Diabetes mellitus without complication (HCC)    Headache    History of left bundle branch block (LBBB)    HTN (hypertension)    Hx of cardiovascular stress test 10/2016   intermediate risk study   Hypothyroidism    Neuromuscular disorder ([place])    Neuropathy    Obesity    OSA on CPAP 03/10/2015   Renal insufficiency    Retinopathy    Sleep apnea    Varicose veins of both lower extremities    Past Surgical History:  Procedure Laterality Date   BIOPSY  11/14/2016   Procedure: BIOPSY;  Surgeon: Danie Binder, MD;  [facility];  Service: Endoscopy;;  colon gastric duodenal   BREAST EXCISIONAL BIOPSY Right    BREAST SURGERY Left 1994   Lumpectomy   COLONOSCOPY WITH PROPOFOL N/A 11/14/2016   Dr. Oneida Alar: Moderately redundant rectosigmoid colon. Random colon biopsies benign. Internal hemorrhoids. Surveillance colonoscopy in 5 years.   COLONOSCOPY WITH PROPOFOL N/A 04/20/2022   Procedure: COLONOSCOPY WITH PROPOFOL;  Surgeon: Eloise Harman, DO;  [facility];  Service: Endoscopy;  Laterality: N/A;   12:30am   CORONARY STENT INTERVENTION N/A 03/22/2018   Procedure: CORONARY STENT INTERVENTION;  Surgeon: Burnell Blanks, MD;  Location: [facility];  Service: Cardiovascular;  Laterality: N/A;   ESOPHAGOGASTRODUODENOSCOPY (EGD) WITH PROPOFOL N/A 11/14/2016   Dr. Oneida Alar: Esophagus normal. Moderate gastritis, few gastric polyps. Duodenal biopsies negative. Fundic gland polyp gastric polyp area no H pylori.   FOOT SURGERY     LEFT HEART CATH AND CORONARY ANGIOGRAPHY N/A 03/22/2018   Procedure: LEFT HEART CATH AND CORONARY ANGIOGRAPHY;  Surgeon: Burnell Blanks, MD;  Location: [facility];  Service: Cardiovascular;  Laterality: N/A;   LEG SURGERY Right    POLYPECTOMY  04/20/2022   Procedure: POLYPECTOMY;  Surgeon: Eloise Harman, DO;  [facility];  Service: Endoscopy;;   REPLACEMENT TOTAL KNEE Right    SPINAL CORD STIMULATOR IMPLANT     Patient Active Problem List   Diagnosis Date Noted   Seasonal and perennial allergic rhinitis 07/13/2021   Diabetic ketoacidosis without coma associated with type 1 diabetes mellitus ([place]) 11/15/2019   Class 1 obesity 11/15/2019   Acute renal failure superimposed on stage 3 chronic kidney disease ([place]) 11/15/2019   CAD (coronary artery disease) 04/16/2018   Unstable angina (  [place]) 03/21/2018   Atypical chest pain 03/20/2018   Gastritis and gastroduodenitis 03/05/2017   Suprapubic pain 03/05/2017   Right sided abdominal pain 10/24/2016   Diarrhea 10/24/2016   Family hx of colon cancer 10/24/2016   Varicose veins of bilateral lower extremities with other complications [id number]   Insomnia with sleep apnea 03/09/2016   Type I diabetes mellitus, uncontrolled 04/30/2015   Mixed hyperlipidemia 04/30/2015   Primary hypothyroidism 04/30/2015   Benign hypertension 04/30/2015   OSA on CPAP 03/10/2015   Migraine without aura and without status migrainosus, not intractable 01/08/2015   Primary stabbing headache  10/02/2014    PCP: Monico Blitz, MD REFERRING PROVIDER: Raylene Miyamoto, MD  REFERRING DIAG: DIZZINESS  THERAPY DIAG:  Dizziness and giddiness - Plan: PT plan of care cert/re-cert  ONSET DATE: Novemeber 1st 2023  Rationale for Evaluation and Treatment: Rehabilitation  SUBJECTIVE:   SUBJECTIVE STATEMENT: Dizzziness with walking; has to hold onto something for safety; uses a rollator or QC for safety; was rolling in bed; got dizzy enough that she threw up; has seen PCP, cardiologist, neurologist and ENT and eye doctor; r/o CVA, according to neurologist r/o migraine related vertigo, testing for BPPV negative per ENT; "only my right ear" gave me trouble with cold and hot air.  Has stayed about the same; initially only gave me an issue with rolling in bed; now gets dizzy with watching TV and with walking.  Able to drive to drive without dizziness because I can "concentrate".  1 fall from dizziness/ while holding dog leash; lives with boyfriend who has early onset dementia  Pt accompanied by: self  PERTINENT HISTORY: history of migraine; cluster headaches; gets shots in the eyes for "water" on the optic nerve Spinal cord stimulator Trauma to right leg in a skiing accident 2003; multiple hardware  PAIN:  Are you having pain? Yes: NPRS scale: 6/10 Pain location: back and down right leg Pain description: throbbing Aggravating factors: unknown Relieving factors: stimulator  PRECAUTIONS: Fall  WEIGHT BEARING RESTRICTIONS: No  FALLS: Has patient fallen in last 6 months? Yes. Number of falls 1  LIVING ENVIRONMENT: Lives with: lives with an adult companion Lives in: House/apartment Stairs: No Has following equipment at home: Quad cane small base  PLOF: Independent with household mobility with device and Independent with community mobility with device  PATIENT GOALS: get back to the gym  OBJECTIVE:   DIAGNOSTIC FINDINGS:   COGNITION: Overall cognitive status: Within functional  limits for tasks assessed   SENSATION: Neuropathy in both feet diabetic neuropathy  EDEMA:      POSTURE:  rounded shoulders and forward head  Cervical ROM:    Active A/PROM (deg) eval  Flexion full  Extension Full (dizzy)  Right lateral flexion   Left lateral flexion   Right rotation full  Left rotation full  (Blank rows = not tested)  STRENGTH: not tested  LOWER EXTREMITY MMT:   MMT Right eval Left eval  Hip flexion    Hip abduction    Hip adduction    Hip internal rotation    Hip external rotation    Knee flexion    Knee extension    Ankle dorsiflexion    Ankle plantarflexion    Ankle inversion    Ankle eversion    (Blank rows = not tested)  BED MOBILITY:  Sit to supine CGA Supine to sit CGA  TRANSFERS: Assistive device utilized: Quad cane small base  Sit to stand: SBA Stand to sit: SBA Chair  to chair: SBA Floor:     GAIT: Gait pattern:  holds to wall, decreased step length- Right, and decreased step length- Left Distance walked: 50 ft Assistive device utilized: Quad cane small base Level of assistance: SBA Comments: " I'm dizzy need to hold to the wall"  FUNCTIONAL TESTS:  Not tested at eval  PATIENT SURVEYS:  DHI 44 (Moderate handicap)  VESTIBULAR ASSESSMENT:  GENERAL OBSERVATION: glasses   SYMPTOM BEHAVIOR:  Subjective history: see above  Non-Vestibular symptoms: headaches and migraine symptoms  Type of dizziness: Imbalance (Disequilibrium), Spinning/Vertigo, and Unsteady with head/body turns  Frequency: constant but can increase or decrease intensity  Duration: constant but intensities will subside fairly quickly 10 sec or less  Aggravating factors: Induced by position change: lying supine and supine to sit, Induced by motion: occur when walking, looking up at the ceiling, and activity in general, and Occurs when standing still   Relieving factors: head stationary, closing eyes, rest, and slow movements  Progression of symptoms:  worse  OCULOMOTOR EXAM:  Ocular Alignment: normal; small pupils  Ocular ROM: No Limitations  Spontaneous Nystagmus: absent  Gaze-Induced Nystagmus: absent  Smooth Pursuits: intact  Saccades:   Convergence/Divergence:  cm     VESTIBULAR - OCULAR REFLEX:   Slow VOR: Normal   Head-Impulse Test: HIT Right: positive HIT Left: positive  Dynamic Visual Acuity: Dynamic: increased dizziness   POSITIONAL TESTING: Right Dix-Hallpike: no nystagmus and very dizzy Left Dix-Hallpike: no nystagmus and very dizzy  MOTION SENSITIVITY:  Motion Sensitivity Quotient Intensity: 0 = none, 1 = Lightheaded, 2 = Mild, 3 = Moderate, 4 = Severe, 5 = Vomiting  Intensity  1. Sitting to supine 4  2. Supine to L side   3. Supine to R side   4. Supine to sitting 4  5. L Hallpike-Dix 4  6. Up from L  4  7. R Hallpike-Dix 4  8. Up from R  4  9. Sitting, head tipped to L knee 1  10. Head up from L knee 1  11. Sitting, head tipped to R knee 1  12. Head up from R knee 2  13. Sitting head turns x5 1  14.Sitting head nods x5 3 (looking up)  15. In stance, 180 turn to L    16. In stance, 180 turn to R     OTHOSTATICS: not done  FUNCTIONAL GAIT: Dynamic Gait Index: test next visit please   VESTIBULAR TREATMENT:                                                                                                   DATE: 09/25/22 physical therapy evaluation and HEP instruction  Canalith Repositioning:   Gaze Adaptation:  x1 Viewing Horizontal: Position: sitting, Time: 3, and Reps: 10 and x1 Viewing Vertical:  Position: sitting, Time: 3, and Reps: 10 Habituation:   Other:   PATIENT EDUCATION: Education details: Patient educated on exam findings, POC, scope of PT, HEP. Person educated: Patient Education method: Explanation, Demonstration, and Handouts Education comprehension: verbalized understanding, returned demonstration, verbal cues required, and tactile cues required  HOME EXERCISE  PROGRAM: Access Code: [email or web address] URL: [email or web address] Date: 09/25/2022 Prepared by: [facility]  Exercises - Seated Gaze Stabilization with Head Rotation  - 2 x daily - 7 x weekly - 2 sets - 10 reps - Seated Gaze Stabilization with Head Nod  - 2 x daily - 7 x weekly - 2 sets - 10 reps GOALS: Goals reviewed with patient? No  SHORT TERM GOALS: Target date: 10/09/2022  patient will be independent with initial HEP  Baseline: Goal status: INITIAL  2.  Patient will self report 30% improvement to improve tolerance for functional activity   Baseline:  Goal status: INITIAL   LONG TERM GOALS: Target date: 10/23/2022  Patient will be independent in self management strategies to improve quality of life and functional outcomes Baseline:  Goal status: INITIAL  2.  Patient will self report 50% improvement to improve tolerance for functional activity   Baseline:  Goal status: INITIAL  3.  Patient will remain free of falls Baseline:  Goal status: INITIAL  4.  Patient will improve her score on the [facility] by 10 points to demonstrate decreased dizziness with everyday tasks.  Baseline:  Goal status: INITIAL  5.  Patient will be able to walk with LRAD without feeling dizzy while looking up at upper shelves in the supermarket Baseline:  Goal status: INITIAL  ASSESSMENT:  CLINICAL IMPRESSION: Patient is a 62 y.o. female who was seen today for physical therapy evaluation and treatment for dizziness. Patient  presents to physical therapy with complaint of constant dizziness that worsens with certain movements and position changes.  She has difficulty with canalith repositioning and with gaze stabilization. Patient demonstrates increased vestibular symptoms with provocative testing which is negatively impacting patient ability to perform ADLs and functional mobility tasks. Patient will benefit from skilled physical therapy services to address these deficits to improve level of  function with ADLs, functional mobility tasks, and reduce risk for falls.    OBJECTIVE IMPAIRMENTS: Abnormal gait, decreased activity tolerance, decreased balance, decreased coordination, decreased endurance, decreased mobility, difficulty walking, dizziness, impaired perceived functional ability, and pain.   ACTIVITY LIMITATIONS: carrying, lifting, bending, sitting, standing, squatting, sleeping, stairs, transfers, bed mobility, reach over head, hygiene/grooming, locomotion level, and caring for others  PARTICIPATION LIMITATIONS: meal prep, cleaning, laundry, shopping, and community activity  REHAB POTENTIAL: Good  CLINICAL DECISION MAKING: Evolving/moderate complexity  EVALUATION COMPLEXITY: Moderate   PLAN:  PT FREQUENCY: 2x/week  PT DURATION: 4 weeks  PLANNED INTERVENTIONS: Therapeutic exercises, Therapeutic activity, Neuromuscular re-education, Balance training, Gait training, Patient/Family education, Joint manipulation, Joint mobilization, Stair training, Orthotic/Fit training, DME instructions, Aquatic Therapy, Dry Needling, Electrical stimulation, Spinal manipulation, Spinal mobilization, Cryotherapy, Moist heat, Compression bandaging, scar mobilization, Splintting, Taping, Traction, Ultrasound, Ionotophoresis '4mg'$ /ml Dexamethasone, and Manual therapy   PLAN FOR NEXT SESSION: Review HEP and goals; DHI ; retest DH right and left; no noted nystagmus but complaint of significant dizziness with testing on evaluation; also difficulty with gaze stabilization; progress habituation   10:01 AM, 09/25/22 Messiyah Waterson Small Matheau [place] MPT [facility] [place] [phone] Ph:[phone]

## 2022-09-25 ENCOUNTER — Ambulatory Visit (HOSPITAL_COMMUNITY): Payer: Medicare Other | Attending: Otolaryngology

## 2022-09-25 ENCOUNTER — Other Ambulatory Visit: Payer: Self-pay

## 2022-09-25 DIAGNOSIS — R42 Dizziness and giddiness: Secondary | ICD-10-CM | POA: Diagnosis not present

## 2022-09-27 ENCOUNTER — Ambulatory Visit: Payer: Self-pay

## 2022-09-27 ENCOUNTER — Ambulatory Visit (INDEPENDENT_AMBULATORY_CARE_PROVIDER_SITE_OTHER): Payer: Medicare Other | Admitting: Family Medicine

## 2022-09-27 ENCOUNTER — Other Ambulatory Visit: Payer: Self-pay

## 2022-09-27 ENCOUNTER — Encounter: Payer: Self-pay | Admitting: Family Medicine

## 2022-09-27 VITALS — BP 162/70 | HR 89 | Temp 98.1°F | Resp 16 | Ht 68.0 in | Wt 253.0 lb

## 2022-09-27 DIAGNOSIS — N1831 Chronic kidney disease, stage 3a: Secondary | ICD-10-CM | POA: Diagnosis not present

## 2022-09-27 DIAGNOSIS — N179 Acute kidney failure, unspecified: Secondary | ICD-10-CM | POA: Diagnosis not present

## 2022-09-27 DIAGNOSIS — J302 Other seasonal allergic rhinitis: Secondary | ICD-10-CM

## 2022-09-27 DIAGNOSIS — J309 Allergic rhinitis, unspecified: Secondary | ICD-10-CM

## 2022-09-27 DIAGNOSIS — Z79899 Other long term (current) drug therapy: Secondary | ICD-10-CM | POA: Diagnosis not present

## 2022-09-27 NOTE — Patient Instructions (Incomplete)
1. Chronic rhinitis (grasses, ragweed, weeds, trees, indoor molds, outdoor molds, dust mites, cat, and dog)   2. No follow-ups on file.    Please inform us of any Emergency Department visits, hospitalizations, or changes in symptoms. Call us before going to the ED for breathing or allergy symptoms since we might be able to fit you in for a sick visit. Feel free to contact us anytime with any questions, problems, or concerns.  It was a pleasure to meet you today!  Websites that have reliable patient information: 1. American Academy of Asthma, Allergy, and Immunology: www.aaaai.org 2. Food Allergy Research and Education (FARE): foodallergy.org 3. Mothers of Asthmatics: http://www.asthmacommunitynetwork.org 4. American College of Allergy, Asthma, and Immunology: www.acaai.org   COVID-19 Vaccine Information can be found at: ShippingScam.co.uk For questions related to vaccine distribution or appointments, please email [email or web address] or call [phone].   We realize that you might be concerned about having an allergic reaction to the COVID19 vaccines. To help with that concern, WE ARE OFFERING THE COVID19 VACCINES IN OUR OFFICE! Ask the front desk for dates!     "Like" [place] on Facebook and Instagram for our latest updates!      A healthy democracy works best when [employer] participate! Make sure you are registered to vote! If you have moved or changed any of your contact information, you will need to get this updated before voting!  In some cases, you MAY be able to register to vote online: [email or web address]

## 2022-09-27 NOTE — Progress Notes (Unsigned)
   Jodi Ray, [address] [place] [id number] Dept: [phone]  FOLLOW UP NOTE  Patient ID: Jodi Ray, female    DOB: 03/21/1961  Age: 62 y.o. MRN: [id number] Date of Office Visit: 09/27/2022  Assessment  Chief Complaint: No chief complaint on file.  HPI Jodi Ray is a 62 year old female who presents to the clinic for evaluation of delayed local reaction after allergen injection. She was last seen in this clinic on 04/14/2022 by Dr. Ernst Bowler for evaluation of allergic rhinitis. She began allergen immunotherapy on 08/03/2021. She reports a large reaction in her left arm after receiving mold and dust mite 0.45 ml.    Drug Allergies:  Allergies  Allergen Reactions   Other Nausea And Vomiting and Other (See Comments)    Opiates Alters mental status diarrhea   Fentanyl Nausea And Vomiting and Other (See Comments)    Nausea, vomiting, loss of consciousness, requiring reversal    Physical Exam: There were no vitals taken for this visit.   Physical Exam  Diagnostics:    Assessment and Plan: No diagnosis found.  No orders of the defined types were placed in this encounter.   There are no Patient Instructions on file for this visit.  No follow-ups on file.    Thank you for the opportunity to care for this patient.  Please do not hesitate to contact me with questions.  Gareth Morgan, FNP [facility]

## 2022-09-28 ENCOUNTER — Encounter: Payer: Self-pay | Admitting: Family Medicine

## 2022-09-28 ENCOUNTER — Telehealth: Payer: Self-pay

## 2022-09-28 MED ORDER — TRIAMCINOLONE ACETONIDE 0.1 % EX OINT: 1.0000 | TOPICAL_OINTMENT | Freq: Two times a day (BID) | CUTANEOUS | 0 refills | Status: DC

## 2022-09-28 NOTE — Telephone Encounter (Signed)
Thank you for the update. Can you please let her know that I sent in a triamcinolone for her arm and any itchy skin under her face. She can use this twice a day as needed for up to 2 weeks. Thank you

## 2022-09-28 NOTE — Telephone Encounter (Signed)
Patient called the office back and I informed that the triamcinolone has been sent to the [facility] in [place].

## 2022-09-28 NOTE — Telephone Encounter (Signed)
I tried calling patient. I left a vm to callback to see how she is doing.

## 2022-09-28 NOTE — Telephone Encounter (Signed)
Patient called back patient stated hand itching is gone, her left arm is still swollen/itchy.Patient stated she can't take too much benadryl due to kidneys. She said the arm bothered her last night and could not sleep. Took a benadryl yesterday when she got home and this morning.I asked if she had any new symptoms and she said she had diarrhea yesterday . She Is still applying hydrocortisone and neosporin on her arm. I informed patient I would send note to [facility].

## 2022-09-28 NOTE — Progress Notes (Signed)
[facility] Note  09/29/2022     CHIEF COMPLAINT Patient presents for Retina Follow Up   HISTORY OF PRESENT ILLNESS: Jodi Ray is a 62 y.o. female who presents to the clinic today for:   HPI     Retina Follow Up   Patient presents with  Diabetic Retinopathy.  In both eyes.  This started months ago.  Duration of 4 weeks.  Since onset it is stable.  I, the attending physician,  performed the HPI with the patient and updated documentation appropriately.        Comments   Patient feels that the vision is getting clearer. She is not using any eye drops at this time. Her blood sugar was 69.      Last edited by Bernarda Caffey, MD on 09/29/2022 12:20 PM.    Pt states that her eyes are doing "great"   Referring physician: Monico Blitz, MD [place],  [place] [address]  HISTORICAL INFORMATION:   Selected notes from the MEDICAL RECORD NUMBER Referred by Dr. Mare Ferrari for concern of DME LEE:  Ocular Hx- PMH-    CURRENT MEDICATIONS: No current outpatient medications on file. (Ophthalmic Drugs)   No current facility-administered medications for this visit. (Ophthalmic Drugs)   Current Outpatient Medications (Other)  Medication Sig   ascorbic acid (VITAMIN C) 500 MG tablet Take by mouth.   aspirin EC 81 MG tablet Take 1 tablet (81 mg total) by mouth daily. Swallow whole.   atorvastatin (LIPITOR) 80 MG tablet TAKE 1 TABLET BY MOUTH ONCE DAILY AT  6PM   calcitRIOL (ROCALTROL) 0.25 MCG capsule Take 0.25 mcg by mouth every other day.   carvedilol (COREG) 3.125 MG tablet Take by mouth.   cholecalciferol (VITAMIN D3) 10 MCG (400 UNIT) TABS tablet Take by mouth.   clopidogrel (PLAVIX) 75 MG tablet Take by mouth. (Patient not taking: Reported on 09/27/2022)   Continuous Blood Gluc Sensor (DEXCOM G6 SENSOR) MISC 1 Device by Other route as directed.   EPINEPHrine 0.3 mg/0.3 mL IJ SOAJ injection Inject 0.3 mg into the muscle as needed for anaphylaxis.    fish oil-omega-3 fatty acids 1000 MG capsule Take 1 g by mouth in the morning and at bedtime.   furosemide (LASIX) 20 MG tablet Take 20 mg by mouth. Take 2 tablets in the morning and 1 tablet in the evening   furosemide (LASIX) 40 MG tablet Take 20-40 mg by mouth 2 (two) times daily. 40 MG AM, 20 MG PM   Galcanezumab-gnlm (EMGALITY) 120 MG/ML SOAJ 120 mg every 30 (thirty) days.   Glucagon (GVOKE HYPOPEN 2-PACK) 1 MG/0.2ML SOAJ    glucose blood ([employer] BLOOD GLUCOSE TEST) test strip    hydrochlorothiazide (MICROZIDE) 12.5 MG capsule Take by mouth.   Insulin Human (INSULIN PUMP) SOLN Inject into the skin continuous.    Insulin Pen Needle (PEN NEEDLES 31GX5/16") 31G X 8 MM MISC as directed subcutaneous Twice a day for 30 days   JARDIANCE 10 MG TABS tablet Take 5 mg by mouth every other day.   KERENDIA 10 MG TABS Take 1 tablet by mouth daily.   levothyroxine (SYNTHROID) 112 MCG tablet Take 112 mcg by mouth daily before breakfast.   lisinopril (ZESTRIL) 10 MG tablet Take by mouth.   loratadine (CLARITIN) 10 MG tablet Take 2 tablets (20 mg total) by mouth daily.   meclizine (ANTIVERT) 25 MG tablet Take 25 mg by mouth every 6 (six) hours as needed.  metFORMIN (GLUCOPHAGE) 500 MG tablet Take 1 tablet (500 mg total) by mouth 2 (two) times daily with a meal.   nitroGLYCERIN (NITROSTAT) 0.4 MG SL tablet Place 1 tablet (0.4 mg total) under the tongue every 5 (five) minutes as needed for chest pain.   NOVOLOG 100 UNIT/ML injection Inject into the skin continuous. In insulin pump   pantoprazole (PROTONIX) 40 MG tablet Take 1 tablet (40 mg total) by mouth daily.   triamcinolone ointment (KENALOG) 0.1 % Apply 1 Application topically 2 (two) times daily.   No current facility-administered medications for this visit. (Other)   REVIEW OF SYSTEMS: ROS   Positive for: Endocrine, Cardiovascular, Eyes Negative for: Constitutional, Gastrointestinal, Neurological, Skin, Genitourinary, Musculoskeletal, HENT,  Respiratory, Psychiatric, Allergic/Imm, Heme/Lymph Last edited by Annie Paras, COT on 09/29/2022  9:16 AM.      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 02/2018: s/p DES to Proximal LAD with residual 20% RCA stenosis.   Chronic pain    Diabetes mellitus without complication (HCC)    Headache    History of left bundle branch block (LBBB)    HTN (hypertension)    Hx of cardiovascular stress test 10/2016   intermediate risk study   Hypothyroidism    Neuromuscular disorder ([place])    Neuropathy    Obesity    OSA on CPAP 03/10/2015   Renal insufficiency    Retinopathy    Sleep apnea    Varicose veins of both lower extremities    Past Surgical History:  Procedure Laterality Date   BIOPSY  11/14/2016   Procedure: BIOPSY;  Surgeon: Danie Binder, MD;  [facility];  Service: Endoscopy;;  colon gastric duodenal   BREAST EXCISIONAL BIOPSY Right    BREAST SURGERY Left 1994   Lumpectomy   COLONOSCOPY WITH PROPOFOL N/A 11/14/2016   Dr. Oneida Alar: Moderately redundant rectosigmoid colon. Random colon biopsies benign. Internal hemorrhoids. Surveillance colonoscopy in 5 years.   COLONOSCOPY WITH PROPOFOL N/A 04/20/2022   Procedure: COLONOSCOPY WITH PROPOFOL;  Surgeon: Eloise Harman, DO;  [facility];  Service: Endoscopy;  Laterality: N/A;  12:30am   CORONARY STENT INTERVENTION N/A 03/22/2018   Procedure: CORONARY STENT INTERVENTION;  Surgeon: Burnell Blanks, MD;  Location: [facility];  Service: Cardiovascular;  Laterality: N/A;   ESOPHAGOGASTRODUODENOSCOPY (EGD)  WITH PROPOFOL N/A 11/14/2016   Dr. Oneida Alar: Esophagus normal. Moderate gastritis, few gastric polyps. Duodenal biopsies negative. Fundic gland polyp gastric polyp area no H pylori.   FOOT SURGERY     LEFT HEART CATH AND CORONARY ANGIOGRAPHY N/A 03/22/2018   Procedure: LEFT HEART CATH AND CORONARY ANGIOGRAPHY;  Surgeon: Burnell Blanks, MD;  Location: [facility];  Service: Cardiovascular;  Laterality: N/A;   LEG SURGERY Right    POLYPECTOMY  04/20/2022   Procedure: POLYPECTOMY;  Surgeon: Eloise Harman, DO;  [facility];  Service: Endoscopy;;   REPLACEMENT TOTAL KNEE Right    SPINAL CORD STIMULATOR IMPLANT     FAMILY HISTORY Family History  Problem Relation  Age of Onset   Cancer Mother 65       colon   Cancer Father        renal cell   Colon cancer Father    Breast cancer Sister    Cancer Sister        primary brain, and primary breast too   Breast cancer Sister    Cancer Sister        breast    Breast cancer Sister    Breast cancer Sister    Cancer Brother        kidney and liver   Sleep apnea Neg Hx    SOCIAL HISTORY Social History   Tobacco Use   Smoking status: Never   Smokeless tobacco: Never  Vaping Use   Vaping Use: Never used  Substance Use Topics   Alcohol use: No    Alcohol/week: 0.0 standard drinks of alcohol   Drug use: No       OPHTHALMIC EXAM:  Base Eye Exam     Visual Acuity (Snellen - Linear)       Right Left   Dist cc 20/30 20/30   Dist ph cc NI 20/25    Correction: Glasses         Tonometry (Tonopen, 9:19 AM)       Right Left   Pressure 19 19         Pupils       Dark Light Shape React APD   Right 3 2 Round Brisk None   Left 3 2 Round Brisk None         Visual Fields       Left Right    Full Full         Extraocular Movement       Right Left    Full, Ortho Full, Ortho         Neuro/Psych     Oriented x3: Yes   Mood/Affect: Normal         Dilation     Both eyes: 1.0%  Mydriacyl, 2.5% Phenylephrine @ 9:16 AM           Slit Lamp and Fundus Exam     Slit Lamp Exam       Right Left   Lids/Lashes Dermatochalasis - upper lid, mild MGD Dermatochalasis - upper lid, mild MGD   Conjunctiva/Sclera nasal pingeucula nasal pingeucula, Trace Injection   Cornea arcus, mild tear film debris arcus, mild tear film debris   Anterior Chamber deep and clear, no cell or flare deep and clear, no cell or flare   Iris Round and dilated, No NVI Round and dilated, No NVI   Lens 2+ Nuclear sclerosis, 2-3+ Cortical cataract, 1+ Posterior subcapsular cataract 2+ Nuclear sclerosis, 2-3+ Cortical cataract, 1+ Posterior subcapsular cataract   Anterior Vitreous Vitreous syneresis Vitreous syneresis         Fundus Exam       Right Left   Disc Pink and Sharp, no NVD, temporal PPA Pink and Sharp, no NVD, temporal PPP   C/D Ratio 0.4 0.4   Macula Blunted foveal reflex, +cystic changes - improving, scattered MA Flat, good foveal reflex, scattered MA, trace cystic changes   Vessels attenuated, Tortuous attenuated, Tortuous, +NV   Periphery Attached, dense 360 PRP extending posteriorly to arcades Attached, 360 PRP with good posterior fill in           Refraction     Wearing Rx  Sphere Cylinder Axis Add   Right +1.75 +1.50 124 +2.50   Left +4.25 +2.25 001 +2.50           IMAGING AND PROCEDURES  Imaging and Procedures for 09/29/2022  OCT, Retina - OU - Both Eyes       Right Eye Quality was good. Central Foveal Thickness: 338. Progression has been stable. Findings include no SRF, abnormal foveal contour, intraretinal fluid (persistent cystic changes temporal macula and centrally).   Left Eye Quality was good. Central Foveal Thickness: 309. Progression has been stable. Findings include normal foveal contour, no SRF, intraretinal fluid (Persistent cystic changes temporal macula).   Notes *Images captured and stored on drive  Diagnosis / Impression:  +DME  OU OD: persistent cystic changes temporal macula and centrally OS: persistent cystic changes temporal macula  Clinical management:  See below  Abbreviations: NFP - Normal foveal profile. CME - cystoid macular edema. PED - pigment epithelial detachment. IRF - intraretinal fluid. SRF - subretinal fluid. EZ - ellipsoid zone. ERM - epiretinal membrane. ORA - outer retinal atrophy. ORT - outer retinal tubulation. SRHM - subretinal hyper-reflective material. IRHM - intraretinal hyper-reflective material      Intravitreal Injection, Pharmacologic Agent - OD - Right Eye       Time Out 09/29/2022. 10:43 AM. Confirmed correct patient, procedure, site, and patient consented.   Anesthesia Topical anesthesia was used. Anesthetic medications included Lidocaine 2%, Proparacaine 0.5%.   Procedure Preparation included 5% betadine to ocular surface, eyelid speculum. A supplied needle was used.   Injection: 1.25 mg Bevacizumab 1.'25mg'$ /0.81m   Route: Intravitreal, Site: Right Eye   NDC: [id number] Expiration date: 11/12/2022   Post-op Post injection exam found visual acuity of at least counting fingers. The patient tolerated the procedure well. There were no complications. The patient received written and verbal post procedure care education.      Intravitreal Injection, Pharmacologic Agent - OS - Left Eye       Time Out 09/29/2022. 10:43 AM. Confirmed correct patient, procedure, site, and patient consented.   Anesthesia Topical anesthesia was used. Anesthetic medications included Lidocaine 4%, Proparacaine 0.5%.   Procedure Preparation included 5% betadine to ocular surface, eyelid speculum. A (32g) needle was used.   Injection: 1.25 mg Bevacizumab 1.'25mg'$ /0.054m  Route: Intravitreal, Site: Left Eye   NDC: [id number]: [id number] date: 12/09/2022   Post-op Post injection exam found visual acuity of at least counting fingers. The patient tolerated the  procedure well. There were no complications. The patient received written and verbal post procedure care education. Post injection medications were not given.            ASSESSMENT/PLAN:    ICD-10-CM   1. Proliferative diabetic retinopathy of both eyes with macular edema associated with type 2 diabetes mellitus (HCC)  E11.3513 OCT, Retina - OU - Both Eyes    Intravitreal Injection, Pharmacologic Agent - OD - Right Eye    Intravitreal Injection, Pharmacologic Agent - OS - Left Eye    Bevacizumab (AVASTIN) SOLN 1.25 mg    Bevacizumab (AVASTIN) SOLN 1.25 mg    2. Essential hypertension  I10     3. Hypertensive retinopathy of both eyes  H35.033     4. Combined forms of age-related cataract of both eyes  H25.813      Proliferative diabetic retinopathy OU - previously managed by Dr. BoValetta Close- lost to f/u since 2020 - s/p PRP OU and IVA OS x7 (BB) - s/p IVA  OD #1 (01.02.24) #2 (02.02.24) - s/p IVA OS #1 (01.04.24) #2 (02.02.24) - s/p PRP fill in OS (01.23.24) - exam shows scattered MA OU; laser PRP OU - FA (01.02.23) shows OD: Cluster of leakage temporal macula, no NV; OS: Scattered focal NVE greatest inferior and temporal quads -- will need PRP fill in OS - OCT shows OD: persistent cystic changes temporal macula and centrally; OS: persistent cystic changes temporal macula at 4 wks - recommend IVA OU #3 today, 03.01.24 w/ f/u in 4 wks - pt wishes to proceed with injections - RBA of procedure discussed, questions answered - see procedure note - IVA informed consent obtained and signed, 01.02.24 (OU) - f/u 4 weeks -- DFE, OCT  2,3. Hypertensive retinopathy OU - discussed importance of tight BP control - monitor  4. Mixed Cataract OU - The symptoms of cataract, surgical options, and treatments and risks were discussed with patient. - discussed diagnosis and progression - monitor  Ophthalmic Meds Ordered this visit:  Meds ordered this encounter  Medications   Bevacizumab  (AVASTIN) SOLN 1.25 mg   Bevacizumab (AVASTIN) SOLN 1.25 mg     Return in about 4 weeks (around 10/27/2022) for f/u PDR OU, DFE, OCT.  There are no Patient Instructions on file for this visit.  This document serves as a record of services personally performed by Gardiner Sleeper, MD, PhD. It was created on their behalf by Joetta Manners COT, an ophthalmic technician. The creation of this record is the provider's dictation and/or activities during the visit.    Electronically signed by: Joetta Manners COT 09/28/2022 1:10 AM  This document serves as a record of services personally performed by Gardiner Sleeper, MD, PhD. It was created on their behalf by San Jetty. Owens Shark, OA an ophthalmic technician. The creation of this record is the provider's dictation and/or activities during the visit.    Electronically signed by: San Jetty. Owens Shark, OA 03.01.2024 1:10 AM  Gardiner Sleeper, M.D., Ph.D. Diseases & Surgery of the Retina and [place] 09/29/2022   I have reviewed the above documentation for accuracy and completeness, and I agree with the above. Gardiner Sleeper, M.D., Ph.D. 10/02/22 1:12 AM   Abbreviations: M myopia (nearsighted); A astigmatism; H hyperopia (farsighted); P presbyopia; Mrx spectacle prescription;  CTL contact lenses; OD right eye; OS left eye; OU both eyes  XT exotropia; ET esotropia; PEK punctate epithelial keratitis; PEE punctate epithelial erosions; DES dry eye syndrome; MGD meibomian gland dysfunction; ATs artificial tears; PFAT's preservative free artificial tears; [place] nuclear sclerotic cataract; PSC posterior subcapsular cataract; ERM epi-retinal membrane; PVD posterior vitreous detachment; RD retinal detachment; DM diabetes mellitus; DR diabetic retinopathy; NPDR non-proliferative diabetic retinopathy; PDR proliferative diabetic retinopathy; CSME clinically significant macular edema; DME diabetic macular edema; dbh dot blot  hemorrhages; CWS cotton wool spot; POAG primary open angle glaucoma; C/D cup-to-disc ratio; HVF humphrey visual field; GVF goldmann visual field; OCT optical coherence tomography; IOP intraocular pressure; BRVO Branch retinal vein occlusion; CRVO central retinal vein occlusion; CRAO central retinal artery occlusion; BRAO branch retinal artery occlusion; RT retinal tear; SB scleral buckle; PPV pars plana vitrectomy; VH Vitreous hemorrhage; PRP panretinal laser photocoagulation; IVK intravitreal kenalog; VMT vitreomacular traction; MH Macular hole;  NVD neovascularization of the disc; NVE neovascularization elsewhere; AREDS age related eye disease study; ARMD age related macular degeneration; POAG primary open angle glaucoma; EBMD epithelial/anterior basement membrane dystrophy; ACIOL anterior chamber intraocular lens; IOL intraocular lens; PCIOL posterior chamber intraocular  lens; Phaco/IOL phacoemulsification with intraocular lens placement; [place] photorefractive keratectomy; LASIK laser assisted in situ keratomileusis; HTN hypertension; DM diabetes mellitus; COPD chronic obstructive pulmonary disease

## 2022-09-28 NOTE — Telephone Encounter (Signed)
Of course! I tried calling her and I left a vm to give a callback to inform of triamcinolone.

## 2022-09-28 NOTE — Telephone Encounter (Signed)
-----   Message from Dara Hoyer, FNP sent at 09/28/2022  8:32 AM EST ----- Can you please call to check on this patient. She had local swelling and hand itching after her injection yesterday. Thank you

## 2022-09-29 ENCOUNTER — Ambulatory Visit (INDEPENDENT_AMBULATORY_CARE_PROVIDER_SITE_OTHER): Payer: Medicare Other | Admitting: Ophthalmology

## 2022-09-29 ENCOUNTER — Encounter (INDEPENDENT_AMBULATORY_CARE_PROVIDER_SITE_OTHER): Payer: Self-pay | Admitting: Ophthalmology

## 2022-09-29 DIAGNOSIS — E113513 Type 2 diabetes mellitus with proliferative diabetic retinopathy with macular edema, bilateral: Secondary | ICD-10-CM

## 2022-09-29 DIAGNOSIS — I10 Essential (primary) hypertension: Secondary | ICD-10-CM

## 2022-09-29 DIAGNOSIS — H25813 Combined forms of age-related cataract, bilateral: Secondary | ICD-10-CM

## 2022-09-29 DIAGNOSIS — H35033 Hypertensive retinopathy, bilateral: Secondary | ICD-10-CM

## 2022-09-29 MED ORDER — BEVACIZUMAB CHEMO INJECTION 1.25MG/0.05ML SYRINGE FOR KALEIDOSCOPE
1.2500 mg | INTRAVITREAL | Status: AC | PRN
  Administered 2022-09-29: 1.25 mg via INTRAVITREAL

## 2022-09-29 NOTE — Telephone Encounter (Signed)
ty

## 2022-10-04 ENCOUNTER — Ambulatory Visit (HOSPITAL_COMMUNITY): Payer: Medicare Other | Attending: Otolaryngology | Admitting: Physical Therapy

## 2022-10-04 DIAGNOSIS — R42 Dizziness and giddiness: Secondary | ICD-10-CM | POA: Diagnosis not present

## 2022-10-04 NOTE — Therapy (Signed)
OUTPATIENT PHYSICAL THERAPY VESTIBULAR treatment     Patient Name: Jodi Ray MRN: [id number] DOB:[birth year], 62 y.o., female Today's Date: 10/04/2022  END OF SESSION:  PT End of Session - 10/04/22 1505     Visit Number 2    Number of Visits 8    Date for PT Re-Evaluation 10/23/22    Authorization Type Medicare Part A; Publishing copy    Authorization Time Period 80/20    Progress Note Due on Visit 10    PT Start Time [id number]    PT Stop Time 1430    PT Time Calculation (min) 45 min    Activity Tolerance Other (comment)   dizziness   Behavior During Therapy WFL for tasks assessed/performed              Past Medical History:  Diagnosis Date   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 02/2018: s/p DES to Proximal LAD with residual 20% RCA stenosis.   Chronic pain    Diabetes mellitus without complication (HCC)    Headache    History of left bundle branch block (LBBB)    HTN (hypertension)    Hx of cardiovascular stress test 10/2016   intermediate risk study   Hypothyroidism    Neuromuscular disorder ([place])    Neuropathy    Obesity    OSA on CPAP 03/10/2015   Renal insufficiency    Retinopathy    Sleep apnea    Varicose veins of both lower extremities    Past Surgical History:  Procedure Laterality Date   BIOPSY  11/14/2016   Procedure: BIOPSY;  Surgeon: Danie Binder, MD;  [facility];  Service: Endoscopy;;  colon gastric duodenal   BREAST EXCISIONAL BIOPSY Right    BREAST SURGERY Left 1994   Lumpectomy   COLONOSCOPY WITH PROPOFOL N/A 11/14/2016   Dr. Oneida Alar: Moderately redundant rectosigmoid colon. Random colon biopsies benign. Internal hemorrhoids. Surveillance colonoscopy in 5 years.   COLONOSCOPY WITH PROPOFOL N/A 04/20/2022   Procedure: COLONOSCOPY WITH PROPOFOL;  Surgeon: Eloise Harman, DO;  [facility];  Service: Endoscopy;  Laterality: N/A;  12:30am    CORONARY STENT INTERVENTION N/A 03/22/2018   Procedure: CORONARY STENT INTERVENTION;  Surgeon: Burnell Blanks, MD;  Location: [place] [facility];  Service: Cardiovascular;  Laterality: N/A;   ESOPHAGOGASTRODUODENOSCOPY (EGD) WITH PROPOFOL N/A 11/14/2016   Dr. Oneida Alar: Esophagus normal. Moderate gastritis, few gastric polyps. Duodenal biopsies negative. Fundic gland polyp gastric polyp area no H pylori.   FOOT SURGERY     LEFT HEART CATH AND CORONARY ANGIOGRAPHY N/A 03/22/2018   Procedure: LEFT HEART CATH AND CORONARY ANGIOGRAPHY;  Surgeon: Burnell Blanks, MD;  Location: [place] [facility];  Service: Cardiovascular;  Laterality: N/A;   LEG SURGERY Right    POLYPECTOMY  04/20/2022   Procedure: POLYPECTOMY;  Surgeon: Eloise Harman, DO;  [facility];  Service: Endoscopy;;   REPLACEMENT TOTAL KNEE Right    SPINAL CORD STIMULATOR IMPLANT     Patient Active Problem List   Diagnosis Date Noted   Seasonal and perennial allergic rhinitis 07/13/2021   Diabetic ketoacidosis without coma associated with type 1 diabetes mellitus ([place]) 11/15/2019   Class 1 obesity 11/15/2019   Acute renal failure superimposed on stage 3 chronic kidney disease ([place]) 11/15/2019   CAD (coronary artery disease) 04/16/2018   Unstable  angina ([place]) 03/21/2018   Atypical chest pain 03/20/2018   Gastritis and gastroduodenitis 03/05/2017   Suprapubic pain 03/05/2017   Right sided abdominal pain 10/24/2016   Diarrhea 10/24/2016   Family hx of colon cancer 10/24/2016   Varicose veins of bilateral lower extremities with other complications [id number]   Insomnia with sleep apnea 03/09/2016   Type I diabetes mellitus, uncontrolled 04/30/2015   Mixed hyperlipidemia 04/30/2015   Primary hypothyroidism 04/30/2015   Benign hypertension 04/30/2015   OSA on CPAP 03/10/2015   Migraine without aura and without status migrainosus, not intractable 01/08/2015   Primary stabbing headache 10/02/2014     PCP: Monico Blitz, MD REFERRING PROVIDER: Raylene Miyamoto, MD  REFERRING DIAG: DIZZINESS  THERAPY DIAG:  Dizziness and giddiness  ONSET DATE: Novemeber 1st 2023  Rationale for Evaluation and Treatment: Rehabilitation    SUBJECTIVE STATEMENT: Pt states that she has been completing the exercises five times a day and has not noted any improvement.  She comes to the department using her walker.  The pt states that she fell last night due to her dizziness and then her blood sugar tanked.  She has a insulin pump.  She states that her sugar level is going down right now.  Her entire right leg  is really bothering her due the fall.  PT states that the Dr. Rockey Situ her that her dizziness is due to her inner ear. States that she never gets dizzy when she is driving.    PERTINENT HISTORY: history of migraine; cluster headaches; gets shots in the eyes for "water" on the optic nerve Spinal cord stimulator Trauma to right leg in a skiing accident 2003; multiple hardware  PAIN:  Are you having pain? Yes: NPRS scale: 8/10 Pain location: back and down right leg Pain description: throbbing Aggravating factors: unknown Relieving factors: stimulator  PRECAUTIONS: Fall  WEIGHT BEARING RESTRICTIONS: No  FALLS: Has patient fallen in last 6 months? Yes. Number of falls 1  LIVING ENVIRONMENT: Lives with: lives with an adult companion Lives in: House/apartment Stairs: No Has following equipment at home: Quad cane small base  PLOF: Independent with household mobility with device and Independent with community mobility with device  PATIENT GOALS: get back to the gym  OBJECTIVE:  SENSATION: Neuropathy in both feet diabetic neuropathy  POSTURE:  rounded shoulders and forward head  Cervical ROM:    Active A/PROM (deg) eval  Flexion full  Extension Full (dizzy)  Right lateral flexion   Left lateral flexion   Right rotation full  Left rotation full  (Blank rows = not tested)  BED  MOBILITY:  Sit to supine CGA Supine to sit CGA  TRANSFERS: Assistive device utilized: Quad cane small base  Sit to stand: SBA Stand to sit: SBA Chair to chair: SBA Floor:     GAIT: Gait pattern:  holds to wall, decreased step length- Right, and decreased step length- Left Distance walked: 50 ft Assistive device utilized: Quad cane small base Level of assistance: SBA Comments: " I'm dizzy need to hold to the wall"  FUNCTIONAL TESTS:  Not tested at eval  PATIENT SURVEYS:  DHI 44 (Moderate handicap)  VESTIBULAR ASSESSMENT:  GENERAL OBSERVATION: glasses   SYMPTOM BEHAVIOR:  Subjective history: see above  Non-Vestibular symptoms: headaches and migraine symptoms  Type of dizziness: Imbalance (Disequilibrium), Spinning/Vertigo, and Unsteady with head/body turns  Frequency: constant but can increase or decrease intensity  Duration: constant but intensities will subside fairly quickly 10 sec or less  Aggravating factors:  Induced by position change: lying supine and supine to sit, Induced by motion: occur when walking, looking up at the ceiling, and activity in general, and Occurs when standing still   Relieving factors: head stationary, closing eyes, rest, and slow movements  Progression of symptoms: worse  OCULOMOTOR EXAM:  Ocular Alignment: normal; small pupils  Ocular ROM: No Limitations  Spontaneous Nystagmus: absent  Gaze-Induced Nystagmus: absent  Smooth Pursuits: intact  Saccades:   Convergence/Divergence:  cm     VESTIBULAR - OCULAR REFLEX:   Slow VOR: Normal   Head-Impulse Test: HIT Right: positive HIT Left: positive  Dynamic Visual Acuity: Dynamic: increased dizziness   POSITIONAL TESTING: Right Dix-Hallpike: no nystagmus and very dizzy Left Dix-Hallpike: no nystagmus and very dizzy  MOTION SENSITIVITY:  Motion Sensitivity Quotient Intensity: 0 = none, 1 = Lightheaded, 2 = Mild, 3 = Moderate, 4 = Severe, 5 = Vomiting  Intensity  1. Sitting to supine 4   2. Supine to L side   3. Supine to R side   4. Supine to sitting 4  5. L Hallpike-Dix 4  6. Up from L  4  7. R Hallpike-Dix 4  8. Up from R  4  9. Sitting, head tipped to L knee 1  10. Head up from L knee 1  11. Sitting, head tipped to R knee 1  12. Head up from R knee 2  13. Sitting head turns x5 1  14.Sitting head nods x5 3 (looking up)  15. In stance, 180 turn to L    16. In stance, 180 turn to R     OTHOSTATICS: not done  FUNCTIONAL GAIT: Dynamic Gait Index: test next visit please   VESTIBULAR TREATMENT:                                                                                                  DATE:  10/04/2022 Heat to low back while supine. Eye horizontal motion no sx Eye vertical x 5 (+) dizziness.   Dix halpike RT no nystagmus but Dizzy completed [place] based on sx.   Sitting: Sit tall x 5 Scapular retraction x 5 Cervical retraction x 5 Eye motion x 10  09/25/22 physical therapy evaluation and HEP instruction  Canalith Repositioning:   Gaze Adaptation:  x1 Viewing Horizontal: Position: sitting, Time: 3, and Reps: 10 and x1 Viewing Vertical:  Position: sitting, Time: 3, and Reps: 10 Habituation:   Other:   PATIENT EDUCATION: Education details: Patient educated on exam findings, POC, scope of PT, HEP. Person educated: Patient Education method: Explanation, Demonstration, and Handouts Education comprehension: verbalized understanding, returned demonstration, verbal cues required, and tactile cues required   HOME EXERCISE PROGRAM: 10/04/22 Access Code: [email or web address] URL: [email or web address] Date: 10/04/2022 Prepared by: Rayetta Humphrey  Exercises - Seated Cervical Retraction  - 1 x daily - 7 x weekly - 3 sets - 10 reps - Seated Scapular Retraction  - 1 x daily - 7 x weekly - 3 sets - 10 reps - Seated Eye and Head Movement Coordination - Side to Side  - 1 x daily - 7  x weekly - 3 sets - 10 reps Access Code: [email or web address] URL:  [email or web address] Date: 09/25/2022 Prepared by: [facility]  Exercises - Seated Gaze Stabilization with Head Rotation  - 2 x daily - 7 x weekly - 2 sets - 10 reps - Seated Gaze Stabilization with Head Nod  - 2 x daily - 7 x weekly - 2 sets - 10 reps GOALS: Goals reviewed with patient? No  SHORT TERM GOALS: Target date: 10/09/2022  patient will be independent with initial HEP  Baseline: Goal status: IN PROGRESS  2.  Patient will self report 30% improvement to improve tolerance for functional activity   Baseline:  Goal status: IN PROGRESS   LONG TERM GOALS: Target date: 10/23/2022  Patient will be independent in self management strategies to improve quality of life and functional outcomes Baseline:  Goal status: IN PROGRESS  2.  Patient will self report 50% improvement to improve tolerance for functional activity   Baseline:  Goal status: IN PROGRESS  3.  Patient will remain free of falls Baseline:  Goal status: IN PROGRESS  4.  Patient will improve her score on the [facility] by 10 points to demonstrate decreased dizziness with everyday tasks.  Baseline:  Goal status: IN PROGRESS  5.  Patient will be able to walk with LRAD without feeling dizzy while looking up at upper shelves in the supermarket Baseline:  Goal status: IN PROGRESS  ASSESSMENT:  CLINICAL IMPRESSION:Evaluation and goals reviewed with patient.   PT fell yesterday and walking with her walker  today do to soreness therefore DGI was not completed today.  Patient demonstrates increased vestibular symptoms with provocative testing which is negatively impacting patient ability to perform ADLs and functional mobility tasks. Patient will benefit from skilled physical therapy services to address these deficits to improve level of function with ADLs, functional mobility tasks, and reduce risk for falls.    OBJECTIVE IMPAIRMENTS: Abnormal gait, decreased activity tolerance, decreased balance,  decreased coordination, decreased endurance, decreased mobility, difficulty walking, dizziness, impaired perceived functional ability, and pain.   ACTIVITY LIMITATIONS: carrying, lifting, bending, sitting, standing, squatting, sleeping, stairs, transfers, bed mobility, reach over head, hygiene/grooming, locomotion level, and caring for others  PARTICIPATION LIMITATIONS: meal prep, cleaning, laundry, shopping, and community activity  REHAB POTENTIAL: Good  CLINICAL DECISION MAKING: Evolving/moderate complexity  EVALUATION COMPLEXITY: Moderate   PLAN:  PT FREQUENCY: 2x/week  PT DURATION: 4 weeks  PLANNED INTERVENTIONS: Therapeutic exercises, Therapeutic activity, Neuromuscular re-education, Balance training, Gait training, Patient/Family education, Joint manipulation, Joint mobilization, Stair training, Orthotic/Fit training, DME instructions, Aquatic Therapy, Dry Needling, Electrical stimulation, Spinal manipulation, Spinal mobilization, Cryotherapy, Moist heat, Compression bandaging, scar mobilization, Splintting, Taping, Traction, Ultrasound, Ionotophoresis '4mg'$ /ml Dexamethasone, and Manual therapy   PLAN FOR NEXT SESSION: DGI ; retest [place] right and left;  progress habituation  Rayetta Humphrey, PT CLT [phone]  [phone]

## 2022-10-05 ENCOUNTER — Other Ambulatory Visit (HOSPITAL_COMMUNITY)
Admission: RE | Admit: 2022-10-05 | Discharge: 2022-10-05 | Disposition: A | Discharge status: Home / Self Care | Payer: Medicare Other | Source: Ambulatory Visit | Attending: Nephrology | Admitting: Nephrology

## 2022-10-05 DIAGNOSIS — E1122 Type 2 diabetes mellitus with diabetic chronic kidney disease: Secondary | ICD-10-CM | POA: Diagnosis not present

## 2022-10-05 DIAGNOSIS — I129 Hypertensive chronic kidney disease with stage 1 through stage 4 chronic kidney disease, or unspecified chronic kidney disease: Secondary | ICD-10-CM | POA: Diagnosis not present

## 2022-10-05 DIAGNOSIS — E211 Secondary hyperparathyroidism, not elsewhere classified: Secondary | ICD-10-CM | POA: Diagnosis not present

## 2022-10-05 DIAGNOSIS — R42 Dizziness and giddiness: Secondary | ICD-10-CM | POA: Diagnosis not present

## 2022-10-05 DIAGNOSIS — I5032 Chronic diastolic (congestive) heart failure: Secondary | ICD-10-CM | POA: Diagnosis not present

## 2022-10-05 DIAGNOSIS — R809 Proteinuria, unspecified: Secondary | ICD-10-CM | POA: Diagnosis not present

## 2022-10-05 DIAGNOSIS — N189 Chronic kidney disease, unspecified: Secondary | ICD-10-CM | POA: Diagnosis not present

## 2022-10-05 DIAGNOSIS — E1129 Type 2 diabetes mellitus with other diabetic kidney complication: Secondary | ICD-10-CM | POA: Diagnosis not present

## 2022-10-05 LAB — CBC
HCT: 36.8 % (ref 36.0–46.0)
Hemoglobin: 11.6 g/dL — ABNORMAL LOW (ref 12.0–15.0)
MCH: 27.4 pg (ref 26.0–34.0)
MCHC: 31.5 g/dL (ref 30.0–36.0)
MCV: 87 fL (ref 80.0–100.0)
Platelets: 245 10*3/uL (ref 150–400)
RBC: 4.23 MIL/uL (ref 3.87–5.11)
RDW: 16.8 % — ABNORMAL HIGH (ref 11.5–15.5)
WBC: 6 10*3/uL (ref 4.0–10.5)
nRBC: 0 % (ref 0.0–0.2)

## 2022-10-05 LAB — RENAL FUNCTION PANEL
Albumin: 3.5 g/dL (ref 3.5–5.0)
Anion gap: 10 (ref 5–15)
BUN: 35 mg/dL — ABNORMAL HIGH (ref 8–23)
CO2: 29 mmol/L (ref 22–32)
Calcium: 8.9 mg/dL (ref 8.9–10.3)
Chloride: 98 mmol/L (ref 98–111)
Creatinine, Ser: 1.26 mg/dL — ABNORMAL HIGH (ref 0.44–1.00)
GFR, Estimated: 48 mL/min — ABNORMAL LOW (ref >60)
Glucose, Bld: 142 mg/dL — ABNORMAL HIGH (ref 70–99)
Phosphorus: 3.8 mg/dL (ref 2.5–4.6)
Potassium: 4.1 mmol/L (ref 3.5–5.1)
Sodium: 137 mmol/L (ref 135–145)

## 2022-10-05 LAB — PROTEIN / CREATININE RATIO, URINE
Creatinine, Urine: 52 mg/dL
Protein Creatinine Ratio: 0.25 mg/mg{Cre} — ABNORMAL HIGH (ref 0.00–0.15)
Total Protein, Urine: 13 mg/dL

## 2022-10-06 ENCOUNTER — Ambulatory Visit (HOSPITAL_COMMUNITY): Payer: Medicare Other

## 2022-10-06 ENCOUNTER — Ambulatory Visit (INDEPENDENT_AMBULATORY_CARE_PROVIDER_SITE_OTHER): Payer: Medicare Other

## 2022-10-06 DIAGNOSIS — J309 Allergic rhinitis, unspecified: Secondary | ICD-10-CM

## 2022-10-06 DIAGNOSIS — R42 Dizziness and giddiness: Secondary | ICD-10-CM

## 2022-10-06 NOTE — Therapy (Signed)
OUTPATIENT PHYSICAL THERAPY VESTIBULAR treatment     Patient Name: Jodi Ray MRN: [id number] DOB:08-04-1960, 62 y.o., female Today's Date: 10/06/2022  END OF SESSION:  PT End of Session - 10/06/22 1437     Visit Number 3    Number of Visits 8    Authorization Type Medicare Part A; [place] employee    Authorization Time Period 80/20    Progress Note Due on Visit 10    PT Start Time 1115    PT Stop Time 1200    PT Time Calculation (min) 45 min    Activity Tolerance Patient tolerated treatment well    Behavior During Therapy WFL for tasks assessed/performed            Past Medical History:  Diagnosis Date   Adult RDS ([place])    Anemia    Brain aneurysm    frontal lobe   CAD (coronary artery disease)    a. 02/2018: s/p DES to Proximal LAD with residual 20% RCA stenosis.   Chronic pain    Diabetes mellitus without complication (HCC)    Headache    History of left bundle branch block (LBBB)    HTN (hypertension)    Hx of cardiovascular stress test 10/2016   intermediate risk study   Hypothyroidism    Neuromuscular disorder ([place])    Neuropathy    Obesity    OSA on CPAP 03/10/2015   Renal insufficiency    Retinopathy    Sleep apnea    Varicose veins of both lower extremities    Past Surgical History:  Procedure Laterality Date   BIOPSY  11/14/2016   Procedure: BIOPSY;  Surgeon: Danie Binder, MD;  [facility];  Service: Endoscopy;;  colon gastric duodenal   BREAST EXCISIONAL BIOPSY Right    BREAST SURGERY Left 1994   Lumpectomy   COLONOSCOPY WITH PROPOFOL N/A 11/14/2016   Dr. Oneida Alar: Moderately redundant rectosigmoid colon. Random colon biopsies benign. Internal hemorrhoids. Surveillance colonoscopy in 5 years.   COLONOSCOPY WITH PROPOFOL N/A 04/20/2022   Procedure: COLONOSCOPY WITH PROPOFOL;  Surgeon: Eloise Harman, DO;  [facility];  Service: Endoscopy;  Laterality: N/A;  12:30am   CORONARY STENT INTERVENTION N/A  03/22/2018   Procedure: CORONARY STENT INTERVENTION;  Surgeon: Burnell Blanks, MD;  Location: [facility];  Service: Cardiovascular;  Laterality: N/A;   ESOPHAGOGASTRODUODENOSCOPY (EGD) WITH PROPOFOL N/A 11/14/2016   Dr. Oneida Alar: Esophagus normal. Moderate gastritis, few gastric polyps. Duodenal biopsies negative. Fundic gland polyp gastric polyp area no H pylori.   FOOT SURGERY     LEFT HEART CATH AND CORONARY ANGIOGRAPHY N/A 03/22/2018   Procedure: LEFT HEART CATH AND CORONARY ANGIOGRAPHY;  Surgeon: Burnell Blanks, MD;  Location: [facility];  Service: Cardiovascular;  Laterality: N/A;   LEG SURGERY Right    POLYPECTOMY  04/20/2022   Procedure: POLYPECTOMY;  Surgeon: Eloise Harman, DO;  [facility];  Service: Endoscopy;;   REPLACEMENT TOTAL KNEE Right    SPINAL CORD STIMULATOR IMPLANT     Patient Active Problem List   Diagnosis Date Noted   Seasonal and perennial allergic rhinitis 07/13/2021   Diabetic ketoacidosis without coma associated with type 1 diabetes mellitus ([place]) 11/15/2019   Class 1 obesity 11/15/2019   Acute renal failure superimposed on stage 3 chronic kidney disease ([place]) 11/15/2019   CAD (coronary artery disease) 04/16/2018   Unstable angina ([place]) 03/21/2018   Atypical chest pain 03/20/2018  Gastritis and gastroduodenitis 03/05/2017   Suprapubic pain 03/05/2017   Right sided abdominal pain 10/24/2016   Diarrhea 10/24/2016   Family hx of colon cancer 10/24/2016   Varicose veins of bilateral lower extremities with other complications [id number]   Insomnia with sleep apnea 03/09/2016   Type I diabetes mellitus, uncontrolled 04/30/2015   Mixed hyperlipidemia 04/30/2015   Primary hypothyroidism 04/30/2015   Benign hypertension 04/30/2015   OSA on CPAP 03/10/2015   Migraine without aura and without status migrainosus, not intractable 01/08/2015   Primary stabbing headache 10/02/2014    PCP: Monico Blitz, MD REFERRING  PROVIDER: Raylene Miyamoto, MD  REFERRING DIAG: DIZZINESS  THERAPY DIAG:  Dizziness and giddiness  ONSET DATE: Novemeber 1st 2023  Rationale for Evaluation and Treatment: Rehabilitation    SUBJECTIVE STATEMENT: Patient states that she's dizzy rolling on both side (states that the room is spinning), and when she gets up from laying down (not lightheadedness but more of wooziness). Denies any dizziness when driving. Patient states that she was still dizzy after the last maneuver.  PERTINENT HISTORY: history of migraine; cluster headaches; gets shots in the eyes for "water" on the optic nerve Spinal cord stimulator Trauma to right leg in a skiing accident 2003; multiple hardware  PAIN:  Are you having pain? Yes: NPRS scale: 8/10 Pain location: back and down right leg Pain description: throbbing Aggravating factors: unknown Relieving factors: stimulator  PRECAUTIONS: Fall  WEIGHT BEARING RESTRICTIONS: No  FALLS: Has patient fallen in last 6 months? Yes. Number of falls 1  LIVING ENVIRONMENT: Lives with: lives with an adult companion Lives in: House/apartment Stairs: No Has following equipment at home: Quad cane small base  PLOF: Independent with household mobility with device and Independent with community mobility with device  PATIENT GOALS: get back to the gym  OBJECTIVE:  SENSATION: Neuropathy in both feet diabetic neuropathy  POSTURE:  rounded shoulders and forward head  Cervical ROM:    Active A/PROM (deg) eval  Flexion full  Extension Full (dizzy)  Right lateral flexion   Left lateral flexion   Right rotation full  Left rotation full  (Blank rows = not tested)  BED MOBILITY:  Sit to supine CGA Supine to sit CGA  TRANSFERS: Assistive device utilized: Quad cane small base  Sit to stand: SBA Stand to sit: SBA Chair to chair: SBA Floor:     GAIT: Gait pattern:  holds to wall, decreased step length- Right, and decreased step length- Left Distance  walked: 50 ft Assistive device utilized: Quad cane small base Level of assistance: SBA Comments: " I'm dizzy need to hold to the wall"  FUNCTIONAL TESTS:  Not tested at eval  PATIENT SURVEYS:  DHI 44 (Moderate handicap)  VESTIBULAR ASSESSMENT:  GENERAL OBSERVATION: glasses   SYMPTOM BEHAVIOR:  Subjective history: see above  Non-Vestibular symptoms: headaches and migraine symptoms  Type of dizziness: Imbalance (Disequilibrium), Spinning/Vertigo, and Unsteady with head/body turns  Frequency: constant but can increase or decrease intensity  Duration: constant but intensities will subside fairly quickly 10 sec or less  Aggravating factors: Induced by position change: lying supine and supine to sit, Induced by motion: occur when walking, looking up at the ceiling, and activity in general, and Occurs when standing still   Relieving factors: head stationary, closing eyes, rest, and slow movements  Progression of symptoms: worse  OCULOMOTOR EXAM:  Ocular Alignment: normal; small pupils  Ocular ROM: No Limitations  Spontaneous Nystagmus: absent  Gaze-Induced Nystagmus: absent  Smooth Pursuits: intact  Saccades:   Convergence/Divergence:  cm     VESTIBULAR - OCULAR REFLEX:   Slow VOR: Normal   Head-Impulse Test: HIT Right: positive HIT Left: positive  Dynamic Visual Acuity: Dynamic: increased dizziness   POSITIONAL TESTING: Right Dix-Hallpike: no nystagmus and very dizzy Left Dix-Hallpike: no nystagmus and very dizzy  MOTION SENSITIVITY:  Motion Sensitivity Quotient Intensity: 0 = none, 1 = Lightheaded, 2 = Mild, 3 = Moderate, 4 = Severe, 5 = Vomiting  Intensity  1. Sitting to supine 4  2. Supine to L side   3. Supine to R side   4. Supine to sitting 4  5. L Hallpike-Dix 4  6. Up from L  4  7. R Hallpike-Dix 4  8. Up from R  4  9. Sitting, head tipped to L knee 1  10. Head up from L knee 1  11. Sitting, head tipped to R knee 1  12. Head up from R knee 2  13. Sitting  head turns x5 1  14.Sitting head nods x5 3 (looking up)  15. In stance, 180 turn to L    16. 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 Eye horizontal motion no sx Eye vertical x 5 (+) dizziness.   Dix halpike RT no nystagmus but Dizzy completed [place] based on sx.   Sitting: Sit tall x 5 Scapular retraction x 5 Cervical retraction x 5 Eye motion x 10  09/25/22 physical therapy evaluation and HEP instruction  Canalith Repositioning:   Gaze Adaptation:  x1 Viewing Horizontal: Position: sitting, Time: 3, and Reps: 10 and x1 Viewing Vertical:  Position: sitting, Time: 3, and Reps: 10 Habituation:   Other:   PATIENT EDUCATION: Education details: Updated HEP. Educated on the pathoanatomy of BPPV and vestibular hypofunction. Educated on proper positioning (e.g. when sleeping) post-canalith repositioning maneuver  Person educated: Patient Education method: Explanation, Demonstration, and Handouts Education comprehension: verbalized understanding, returned demonstration, verbal cues required, and tactile cues required   HOME EXERCISE PROGRAM: 10/06/22 GAZE STABILIZATION EXERCISES (2-3x/day, 7 days/week) These exercises are designed to improve your eyes' ability to track objects without getting dizzy. When performing these exercises, make sure you are facing a blank wall (like a white wall) to avoid distraction. You can wear your glasses/contact lenses if you must.   Before anything, warm-up your neck muscles by bending it forward and back 10 times, side-to-side 10 times, and slowly turn your head left and right 10  times.  Exercise 1 (Horizontal Saccades) Use two cards for this exercise While holding the cards on each hand side by side in sitting with arms outstretched, look at the  first item on the other card then look at the next item on the second card then alternate your gaze sequentially. Do NOT move your cards nor the your head. Do the following for one minute each: Identifying numbers Reading words  10/04/22 Access Code: [email or web address] URL: [email or web address] Date: 10/04/2022 Prepared by: Rayetta Humphrey  Exercises - Seated Cervical Retraction  - 1 x daily - 7 x weekly - 3 sets - 10 reps - Seated Scapular Retraction  - 1 x daily - 7 x weekly - 3 sets - 10 reps - Seated Eye and Head Movement Coordination - Side to Side  - 1 x daily - 7 x weekly - 3 sets - 10 reps Access Code: [email or web address] URL: [email or web address] Date: 09/25/2022 Prepared by: [facility]  Exercises - Seated Gaze Stabilization with Head Rotation  - 2 x daily - 7 x weekly - 2 sets - 10 reps - Seated Gaze Stabilization with Head Nod  - 2 x daily - 7 x weekly - 2 sets - 10 reps  GOALS: Goals reviewed with patient? No  SHORT TERM GOALS: Target date: 10/09/2022  patient will be independent with initial HEP  Baseline: Goal status: IN PROGRESS  2.  Patient will self report 30% improvement to improve tolerance for functional activity   Baseline:  Goal status: IN PROGRESS   LONG TERM GOALS: Target date: 10/23/2022  Patient will be independent in self management strategies to improve quality of life and functional outcomes Baseline:  Goal status: IN PROGRESS  2.  Patient will self report 50% improvement to improve tolerance for functional activity   Baseline:  Goal status: IN PROGRESS  3.  Patient will remain free of falls Baseline:  Goal status: IN PROGRESS  4.  Patient will improve her score on the [facility] by 10 points to demonstrate decreased dizziness with everyday tasks.  Baseline:   Goal status: IN PROGRESS  5.  Patient will be able to walk with LRAD without feeling dizzy while looking up at upper shelves in the supermarket Baseline:  Goal status: IN PROGRESS  ASSESSMENT:  CLINICAL IMPRESSION: Performed active VBI test to r/o arterial affection. Patient did not report of lightheadedness but reported of vertigo. Performed modified Dix-Hallpike maneuver and patient is positive on B (R worse than L) as manifested by a torsional upbeat nystagmus and c/o vertigo that quickly subsided. 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 Also performed supine roll test, to which patient is also positive on B (R worse than L) as manifested by ageotropic nystagmus. This could possibly indicate horizontal canal affectation. 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 Re-assessed patient after the 2nd rep and patient demonstrated negative findings on the R modified Dix-Hallpike (no vertigo reported, no nystagmus seen). Did not address the L side today to allow the crystals on the R side to settle. Performed horizontal saccadic exercise today by reading numbers and patient demonstrated corrective saccades but did not report any dizziness. Instructed patient not to sleep on the R side or lay flat for 1-2 nights to allow the crystals to settle. Patient verbalized understanding.  OBJECTIVE IMPAIRMENTS: Abnormal gait, decreased activity tolerance, decreased balance, decreased coordination, decreased endurance, decreased mobility, difficulty walking, dizziness, impaired perceived functional ability, and pain.   ACTIVITY LIMITATIONS: carrying, lifting, bending, sitting, standing, squatting, sleeping, stairs, transfers, bed mobility, reach over head, hygiene/grooming, locomotion level, and caring for others  PARTICIPATION LIMITATIONS: meal prep, cleaning,  laundry, shopping, and community activity  REHAB POTENTIAL: Good  CLINICAL DECISION  MAKING: Evolving/moderate complexity  EVALUATION COMPLEXITY: Moderate   PLAN:  PT FREQUENCY: 2x/week  PT DURATION: 4 weeks  PLANNED INTERVENTIONS: Therapeutic exercises, Therapeutic activity, Neuromuscular re-education, Balance training, Gait training, Patient/Family education, Joint manipulation, Joint mobilization, Stair training, Orthotic/Fit training, DME instructions, Aquatic Therapy, Dry Needling, Electrical stimulation, Spinal manipulation, Spinal mobilization, Cryotherapy, Moist heat, Compression bandaging, scar mobilization, Splintting, Taping, Traction, Ultrasound, Ionotophoresis '4mg'$ /ml Dexamethasone, and Manual therapy   PLAN FOR NEXT SESSION: DGI. Re-assess patient with modified Dix-Hallpike and supine roll test. May need to perform canalith repositioning on the R prn. 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 Progress adaptation, habituation, and introduce substitution prn.  Harvie Heck. Jakobee Brackins, PT, DPT, OCS Board-Certified Clinical Specialist in [place] # ([place]): [phone] T  1200

## 2022-10-11 ENCOUNTER — Encounter: Payer: Self-pay | Admitting: Allergy & Immunology

## 2022-10-11 ENCOUNTER — Ambulatory Visit (INDEPENDENT_AMBULATORY_CARE_PROVIDER_SITE_OTHER): Payer: Medicare Other | Admitting: Allergy & Immunology

## 2022-10-11 ENCOUNTER — Ambulatory Visit (HOSPITAL_COMMUNITY): Payer: Medicare Other

## 2022-10-11 ENCOUNTER — Other Ambulatory Visit: Payer: Self-pay

## 2022-10-11 VITALS — BP 138/82 | HR 90 | Temp 97.6°F | Resp 20 | Ht 68.0 in | Wt 243.0 lb

## 2022-10-11 DIAGNOSIS — N1831 Chronic kidney disease, stage 3a: Secondary | ICD-10-CM | POA: Diagnosis not present

## 2022-10-11 DIAGNOSIS — R42 Dizziness and giddiness: Secondary | ICD-10-CM

## 2022-10-11 DIAGNOSIS — J309 Allergic rhinitis, unspecified: Secondary | ICD-10-CM

## 2022-10-11 DIAGNOSIS — Z79899 Other long term (current) drug therapy: Secondary | ICD-10-CM | POA: Diagnosis not present

## 2022-10-11 DIAGNOSIS — J302 Other seasonal allergic rhinitis: Secondary | ICD-10-CM

## 2022-10-11 DIAGNOSIS — N179 Acute kidney failure, unspecified: Secondary | ICD-10-CM

## 2022-10-11 NOTE — Progress Notes (Signed)
FOLLOW UP  Date of Service/Encounter:  10/11/22   Assessment:   Seasonal and perennial allergic rhinitis (grasses, ragweed, weeds, trees, indoor molds, outdoor molds, dust mites, cat, and dog) - on allergen immunotherapy with maintenance reached    On beta blocker therapy   Complicated past medical history including renal failure as well as type 1 diabetes and migraines   GFR 48 mL/min (March 2024)  Vertigo - sees Dr. Benjamine Mola and receives vestibular therapy    Plan/Recommendations:   1. Chronic rhinitis (grasses, ragweed, weeds, trees, indoor molds, outdoor molds, dust mites, cat, and dog) - Continue with shots at the same schedule with the epinephrine rinses.  - Hopefully this time next year will be even better with the higher dose allergen vials that you are going to be on.  - Continue taking:  Claritin (loratadine) '10mg'$  daily - We will plan for 3-5 years treatment in total (you reached the Red Vial in September 2023, so five years is September 2028).   2. Return in about 1 year (around 10/11/2023). Call us if there are issues in the meantime.   Subjective:   Jodi Ray is a 62 y.o. female presenting today for follow up of  Chief Complaint  Patient presents with   Follow-up    Pt states she is doing well with her injections.Jodi Ray has a history of the following: Patient Active Problem List   Diagnosis Date Noted   Seasonal and perennial allergic rhinitis 07/13/2021   Diabetic ketoacidosis without coma associated with type 1 diabetes mellitus ([place]) 11/15/2019   Class 1 obesity 11/15/2019   Acute renal failure superimposed on stage 3 chronic kidney disease ([place]) 11/15/2019   CAD (coronary artery disease) 04/16/2018   Unstable angina ([place]) 03/21/2018   Atypical chest pain 03/20/2018   Gastritis and gastroduodenitis 03/05/2017   Suprapubic pain 03/05/2017   Right sided abdominal pain 10/24/2016   Diarrhea 10/24/2016   Family hx of colon cancer  10/24/2016   Varicose veins of bilateral lower extremities with other complications [id number]   Insomnia with sleep apnea 03/09/2016   Type I diabetes mellitus, uncontrolled 04/30/2015   Mixed hyperlipidemia 04/30/2015   Primary hypothyroidism 04/30/2015   Benign hypertension 04/30/2015   OSA on CPAP 03/10/2015   Migraine without aura and without status migrainosus, not intractable 01/08/2015   Primary stabbing headache 10/02/2014    History obtained from: chart review and patient.  Jodi Ray is a 63 y.o. female presenting for a follow up visit.  She was last seen in February 2024 by Gareth Morgan, one of our esteemed nurse practitioners.  At that time, she was continued on Claritin and Benadryl as needed.  She was started on triamcinolone 0.0% ointment twice a day as needed for her large local reactions.  We did add epinephrine rinses as well.  Since last visit, she has done well. She saw Webb Silversmith a few weeks ago and she had diarrhea and heart palpitations. She got home and realized that this was a reaction to the allergy shots. She called [facility] recommended that she come back in for evaluation.   Allergic Rhinitis Symptom History: She did great last year when she went to [place].  She thinks that her allergy shots are working well to control her symptoms.    Jodi Ray is on allergen immunotherapy. She receives two injections. Immunotherapy script #1 contains trees, weeds, grasses, cat, and dog. She currently receives 0.64m of the RED vial (1/100). Immunotherapy  script #2 contains molds and dust mites. She currently receives 0.59m of the RED vial (1/100). She started shots January of 2023 and reached maintenance September 2023. She does have epinephrine rinses. Epi rinses have helped a lot to help her tolerate her injections.   She is doing physical therapy for some dizziness. She sees Dr. TBenjamine Molafor vestibular issues.   She sees her Nephrologist next week and her last GFR was 48%. She was told  that she is having anemia of chronic disease. She was told that she needs to "eat more" which she says fly in the face of recommendations to lose weight.   [place] [facility]   Otherwise, there have been no changes to her past medical history, surgical history, family history, or social history.    Review of Systems  Constitutional: Negative.  Negative for chills, fever, malaise/fatigue and weight loss.  HENT:  Negative for congestion, ear discharge, ear pain and sinus pain.        Positive for postnasal drip.  Eyes:  Negative for pain, discharge and redness.  Respiratory:  Negative for cough, sputum production, shortness of breath and wheezing.   Cardiovascular: Negative.  Negative for chest pain and palpitations.  Gastrointestinal:  Negative for abdominal pain, constipation, diarrhea, heartburn, nausea and vomiting.  Skin: Negative.  Negative for itching and rash.  Neurological:  Negative for dizziness and headaches.  Endo/Heme/Allergies:  Negative for environmental allergies. Does not bruise/bleed easily.       Objective:   Blood pressure 138/82, pulse 90, temperature 97.6 F (36.4 C), resp. rate 20, height '5\' 8"'$  (1.727 m), weight 243 lb (110.2 kg), SpO2 96 %. Body mass index is 36.95 kg/m.    Physical Exam Vitals reviewed.  Constitutional:      Appearance: She is well-developed and overweight.     Comments: Very talkative.  Smiling.  Very pleasant.  HENT:     Head: Normocephalic and atraumatic.     Right Ear: Tympanic membrane, ear canal and external ear normal. No drainage, swelling or tenderness. Tympanic membrane is not injected, scarred, erythematous, retracted or bulging.     Left Ear: Tympanic membrane, ear canal and external ear normal. No drainage, swelling or tenderness. Tympanic membrane is not injected, scarred,  erythematous, retracted or bulging.     Nose: Mucosal edema and rhinorrhea present. No nasal deformity or septal deviation.     Right Turbinates: Enlarged, swollen and pale.     Left Turbinates: Enlarged, swollen and pale.     Right Sinus: No maxillary sinus tenderness or frontal sinus tenderness.     Left Sinus: No maxillary sinus tenderness or frontal sinus tenderness.     Comments: Clear rhinorrhea bilaterally. No nasal polyps.     Mouth/Throat:     Lips: Pink.     Mouth: Mucous membranes are moist. Mucous membranes are not pale and not dry.     Pharynx: Uvula midline.     Comments: Cobblestoning in the posterior oropharynx.  Eyes:     General:        Right eye: No discharge.        Left eye: No discharge.     Conjunctiva/sclera: Conjunctivae normal.     Right eye: Right conjunctiva is not injected. No chemosis.    Left eye: Left conjunctiva is not injected. No chemosis.  Pupils: Pupils are equal, round, and reactive to light.  Cardiovascular:     Rate and Rhythm: Normal rate and regular rhythm.     Heart sounds: Normal heart sounds.  Pulmonary:     Effort: Pulmonary effort is normal. No tachypnea, accessory muscle usage or respiratory distress.     Breath sounds: Normal breath sounds. No wheezing, rhonchi or rales.     Comments: Moving air well in all lung fields. No increased work of breathing noted.  Chest:     Chest wall: No tenderness.  Lymphadenopathy:     Head:     Right side of head: No submandibular, tonsillar or occipital adenopathy.     Left side of head: No submandibular, tonsillar or occipital adenopathy.     Cervical: No cervical adenopathy.  Skin:    Coloration: Skin is not pale.     Findings: No abrasion, erythema, petechiae or rash. Rash is not papular, urticarial or vesicular.  Neurological:     Mental Status: She is alert.  Psychiatric:        Behavior: Behavior is cooperative.      Diagnostic studies: none       Salvatore Marvel, MD  Allergy  and [employer]

## 2022-10-11 NOTE — Therapy (Signed)
OUTPATIENT PHYSICAL THERAPY VESTIBULAR treatment     Patient Name: Jodi Ray MRN: [id number] DOB:12/18/1960, 62 y.o., female Today's Date: 10/11/2022  END OF SESSION:  PT End of Session - 10/11/22 0734     Visit Number 4    Number of Visits 8    Authorization Type Medicare Part A; [employer] employee    Authorization Time Period 80/20    Progress Note Due on Visit 10    PT Start Time [phone]    PT Stop Time 0802    PT Time Calculation (min) 29 min    Activity Tolerance Patient tolerated treatment well    Behavior During Therapy WFL for tasks assessed/performed            Past Medical History:  Diagnosis Date   Adult RDS ([place])    Anemia    Brain aneurysm    frontal lobe   CAD (coronary artery disease)    a. 02/2018: s/p DES to Proximal LAD with residual 20% RCA stenosis.   Chronic pain    Diabetes mellitus without complication (HCC)    Headache    History of left bundle branch block (LBBB)    HTN (hypertension)    Hx of cardiovascular stress test 10/2016   intermediate risk study   Hypothyroidism    Neuromuscular disorder ([place])    Neuropathy    Obesity    OSA on CPAP 03/10/2015   Renal insufficiency    Retinopathy    Sleep apnea    Varicose veins of both lower extremities    Past Surgical History:  Procedure Laterality Date   BIOPSY  11/14/2016   Procedure: BIOPSY;  Surgeon: Danie Binder, MD;  [facility];  Service: Endoscopy;;  colon gastric duodenal   BREAST EXCISIONAL BIOPSY Right    BREAST SURGERY Left 1994   Lumpectomy   COLONOSCOPY WITH PROPOFOL N/A 11/14/2016   Dr. Oneida Alar: Moderately redundant rectosigmoid colon. Random colon biopsies benign. Internal hemorrhoids. Surveillance colonoscopy in 5 years.   COLONOSCOPY WITH PROPOFOL N/A 04/20/2022   Procedure: COLONOSCOPY WITH PROPOFOL;  Surgeon: Eloise Harman, DO;  [facility];  Service: Endoscopy;  Laterality: N/A;  12:30am   CORONARY STENT INTERVENTION N/A  03/22/2018   Procedure: CORONARY STENT INTERVENTION;  Surgeon: Burnell Blanks, MD;  Location: [facility];  Service: Cardiovascular;  Laterality: N/A;   ESOPHAGOGASTRODUODENOSCOPY (EGD) WITH PROPOFOL N/A 11/14/2016   Dr. Oneida Alar: Esophagus normal. Moderate gastritis, few gastric polyps. Duodenal biopsies negative. Fundic gland polyp gastric polyp area no H pylori.   FOOT SURGERY     LEFT HEART CATH AND CORONARY ANGIOGRAPHY N/A 03/22/2018   Procedure: LEFT HEART CATH AND CORONARY ANGIOGRAPHY;  Surgeon: Burnell Blanks, MD;  Location: [facility];  Service: Cardiovascular;  Laterality: N/A;   LEG SURGERY Right    POLYPECTOMY  04/20/2022   Procedure: POLYPECTOMY;  Surgeon: Eloise Harman, DO;  [facility];  Service: Endoscopy;;   REPLACEMENT TOTAL KNEE Right    SPINAL CORD STIMULATOR IMPLANT     Patient Active Problem List   Diagnosis Date Noted   Seasonal and perennial allergic rhinitis 07/13/2021   Diabetic ketoacidosis without coma associated with type 1 diabetes mellitus ([place]) 11/15/2019   Class 1 obesity 11/15/2019   Acute renal failure superimposed on stage 3 chronic kidney disease ([place]) 11/15/2019   CAD (coronary artery disease) 04/16/2018   Unstable angina ([place]) 03/21/2018   Atypical chest pain 03/20/2018  Gastritis and gastroduodenitis 03/05/2017   Suprapubic pain 03/05/2017   Right sided abdominal pain 10/24/2016   Diarrhea 10/24/2016   Family hx of colon cancer 10/24/2016   Varicose veins of bilateral lower extremities with other complications [id number]   Insomnia with sleep apnea 03/09/2016   Type I diabetes mellitus, uncontrolled 04/30/2015   Mixed hyperlipidemia 04/30/2015   Primary hypothyroidism 04/30/2015   Benign hypertension 04/30/2015   OSA on CPAP 03/10/2015   Migraine without aura and without status migrainosus, not intractable 01/08/2015   Primary stabbing headache 10/02/2014    PCP: Monico Blitz, MD REFERRING  PROVIDER: Raylene Miyamoto, MD  REFERRING DIAG: DIZZINESS  THERAPY DIAG:  Dizziness and giddiness  ONSET DATE: Burman Riis 1st 2023  Rationale for Evaluation and Treatment: Rehabilitation    SUBJECTIVE STATEMENT: Rolling in bed not as bad but still has a lot of trouble with looking up; had trouble looking up to pull her hatch down on her car; 8/10 dizziness spinning; "the world was spinning"; reports she is compliant with her exercises; no new pain reported today  PERTINENT HISTORY: history of migraine; cluster headaches; gets shots in the eyes for "water" on the optic nerve Spinal cord stimulator Trauma to right leg in a skiing accident 2003; multiple hardware  PAIN:  Are you having pain? Yes: NPRS scale: 8/10 Pain location: back and down right leg Pain description: throbbing Aggravating factors: unknown Relieving factors: stimulator  PRECAUTIONS: Fall  WEIGHT BEARING RESTRICTIONS: No  FALLS: Has patient fallen in last 6 months? Yes. Number of falls 1  LIVING ENVIRONMENT: Lives with: lives with an adult companion Lives in: House/apartment Stairs: No Has following equipment at home: Quad cane small base  PLOF: Independent with household mobility with device and Independent with community mobility with device  PATIENT GOALS: get back to the gym  OBJECTIVE:  SENSATION: Neuropathy in both feet diabetic neuropathy  POSTURE:  rounded shoulders and forward head  Cervical ROM:    Active A/PROM (deg) eval  Flexion full  Extension Full (dizzy)  Right lateral flexion   Left lateral flexion   Right rotation full  Left rotation full  (Blank rows = not tested)  BED MOBILITY:  Sit to supine CGA Supine to sit CGA  TRANSFERS: Assistive device utilized: Quad cane small base  Sit to stand: SBA Stand to sit: SBA Chair to chair: SBA Floor:     GAIT: Gait pattern:  holds to wall, decreased step length- Right, and decreased step length- Left Distance walked: 50  ft Assistive device utilized: Quad cane small base Level of assistance: SBA Comments: " I'm dizzy need to hold to the wall"  FUNCTIONAL TESTS:  Not tested at eval  PATIENT SURVEYS:  DHI 44 (Moderate handicap)  VESTIBULAR ASSESSMENT:  GENERAL OBSERVATION: glasses   SYMPTOM BEHAVIOR:  Subjective history: see above  Non-Vestibular symptoms: headaches and migraine symptoms  Type of dizziness: Imbalance (Disequilibrium), Spinning/Vertigo, and Unsteady with head/body turns  Frequency: constant but can increase or decrease intensity  Duration: constant but intensities will subside fairly quickly 10 sec or less  Aggravating factors: Induced by position change: lying supine and supine to sit, Induced by motion: occur when walking, looking up at the ceiling, and activity in general, and Occurs when standing still   Relieving factors: head stationary, closing eyes, rest, and slow movements  Progression of symptoms: worse  OCULOMOTOR EXAM:  Ocular Alignment: normal; small pupils  Ocular ROM: No Limitations  Spontaneous Nystagmus: absent  Gaze-Induced Nystagmus: absent  Smooth  Pursuits: intact  Saccades:   Convergence/Divergence:  cm     VESTIBULAR - OCULAR REFLEX:   Slow VOR: Normal   Head-Impulse Test: HIT Right: positive HIT Left: positive  Dynamic Visual Acuity: Dynamic: increased dizziness   POSITIONAL TESTING: Right Dix-Hallpike: no nystagmus and very dizzy Left Dix-Hallpike: no nystagmus and very dizzy  MOTION SENSITIVITY:  Motion Sensitivity Quotient Intensity: 0 = none, 1 = Lightheaded, 2 = Mild, 3 = Moderate, 4 = Severe, 5 = Vomiting  Intensity  1. Sitting to supine 4  2. Supine to L side   3. Supine to R side   4. Supine to sitting 4  5. L Hallpike-Dix 4  6. Up from L  4  7. R Hallpike-Dix 4  8. Up from R  4  9. Sitting, head tipped to L knee 1  10. Head up from L knee 1  11. Sitting, head tipped to R knee 1  12. Head up from R knee 2  13. Sitting head turns  x5 1  14.Sitting head nods x5 3 (looking up)  15. In stance, 180 turn to L    16. In stance, 180 turn to R     OTHOSTATICS: not done  FUNCTIONAL GAIT: Dynamic Gait Index: test next visit please   VESTIBULAR TREATMENT:                                                                                                  DATE:  10/11/22 Right sidelying test + for dizziness (although 20% better than before) Semont Maneuver for Right post canal x 1 Left sidelying test + for dizziness (worse than right) Semont Maneuver for Left posterior canal x 1   10/06/22 Semont Maneuver for the R post SCC x 2 reps Seated horizontal saccades, reading words x 1' Patient education (see below)  10/04/2022 Heat to low back while supine. Eye horizontal motion no sx Eye vertical x 5 (+) dizziness.   Dix halpike RT no nystagmus but Dizzy completed [place] based on sx.   Sitting: Sit tall x 5 Scapular retraction x 5 Cervical retraction x 5 Eye motion x 10  09/25/22 physical therapy evaluation and HEP instruction  Canalith Repositioning:   Gaze Adaptation:  x1 Viewing Horizontal: Position: sitting, Time: 3, and Reps: 10 and x1 Viewing Vertical:  Position: sitting, Time: 3, and Reps: 10 Habituation:   Other:   PATIENT EDUCATION: Education details: Updated HEP. Educated on the pathoanatomy of BPPV and vestibular hypofunction. Educated on proper positioning (e.g. when sleeping) post-canalith repositioning maneuver  Person educated: Patient Education method: Explanation, Demonstration, and Handouts Education comprehension: verbalized understanding, returned demonstration, verbal cues required, and tactile cues required   HOME EXERCISE PROGRAM: 10/06/22 GAZE STABILIZATION EXERCISES (2-3x/day, 7 days/week) These exercises are designed to improve your eyes' ability to track objects without getting dizzy. When performing these exercises, make sure you are facing a blank wall (like a white wall) to avoid  distraction. You can wear your glasses/contact lenses if you must.   Before anything, warm-up your neck muscles by bending it forward and back 10 times,  side-to-side 10 times, and slowly turn your head left and right 10 times.  Exercise 1 (Horizontal Saccades) Use two cards for this exercise While holding the cards on each hand side by side in sitting with arms outstretched, look at the first item on the other card then look at the next item on the second card then alternate your gaze sequentially. Do NOT move your cards nor the your head. Do the following for one minute each: Identifying numbers Reading words  10/04/22 Access Code: [email or web address] URL: [email or web address] Date: 10/04/2022 Prepared by: Rayetta Humphrey  Exercises - Seated Cervical Retraction  - 1 x daily - 7 x weekly - 3 sets - 10 reps - Seated Scapular Retraction  - 1 x daily - 7 x weekly - 3 sets - 10 reps - Seated Eye and Head Movement Coordination - Side to Side  - 1 x daily - 7 x weekly - 3 sets - 10 reps Access Code: [email or web address] URL: [email or web address] Date: 09/25/2022 Prepared by: [facility]  Exercises - Seated Gaze Stabilization with Head Rotation  - 2 x daily - 7 x weekly - 2 sets - 10 reps - Seated Gaze Stabilization with Head Nod  - 2 x daily - 7 x weekly - 2 sets - 10 reps  GOALS: Goals reviewed with patient? No  SHORT TERM GOALS: Target date: 10/09/2022  patient will be independent with initial HEP  Baseline: Goal status: IN PROGRESS  2.  Patient will self report 30% improvement to improve tolerance for functional activity   Baseline:  Goal status: IN PROGRESS   LONG TERM GOALS: Target date: 10/23/2022  Patient will be independent in self management strategies to improve quality of life and functional outcomes Baseline:  Goal status: IN PROGRESS  2.  Patient will self report 50% improvement to improve tolerance for functional activity   Baseline:  Goal status:  IN PROGRESS  3.  Patient will remain free of falls Baseline:  Goal status: IN PROGRESS  4.  Patient will improve her score on the [facility] by 10 points to demonstrate decreased dizziness with everyday tasks.  Baseline:  Goal status: IN PROGRESS  5.  Patient will be able to walk with LRAD without feeling dizzy while looking up at upper shelves in the supermarket Baseline:  Goal status: IN PROGRESS  ASSESSMENT:  CLINICAL IMPRESSION:Patient with reports of dizziness with right sidelying test for posterior canal although "20%" better than before; semont maneuver for right posterior canal x 1; tested left posterior canal with left sidelying test; + for dizziness so treated with left semont maneuver.  Patient reports she feel better after treatment although still feeling off balance.  Instructed patient to not lie down today and avoid looking up; resume her HEP tomorrow.  Patient will benefit from continued skilled therapy services to address deficits and promote return to optimal function.      OBJECTIVE IMPAIRMENTS: Abnormal gait, decreased activity tolerance, decreased balance, decreased coordination, decreased endurance, decreased mobility, difficulty walking, dizziness, impaired perceived functional ability, and pain.   ACTIVITY LIMITATIONS: carrying, lifting, bending, sitting, standing, squatting, sleeping, stairs, transfers, bed mobility, reach over head, hygiene/grooming, locomotion level, and caring for others  PARTICIPATION LIMITATIONS: meal prep, cleaning, laundry, shopping, and community activity  REHAB POTENTIAL: Good  CLINICAL DECISION MAKING: Evolving/moderate complexity  EVALUATION COMPLEXITY: Moderate   PLAN:  PT FREQUENCY: 2x/week  PT DURATION: 4 weeks  PLANNED INTERVENTIONS: Therapeutic exercises, Therapeutic activity, Neuromuscular re-education, Balance training, Gait training, Patient/Family education, Joint manipulation, Joint  mobilization, Stair training,  Orthotic/Fit training, DME instructions, Aquatic Therapy, Dry Needling, Electrical stimulation, Spinal manipulation, Spinal mobilization, Cryotherapy, Moist heat, Compression bandaging, scar mobilization, Splintting, Taping, Traction, Ultrasound, Ionotophoresis '4mg'$ /ml Dexamethasone, and Manual therapy   PLAN FOR NEXT SESSION:  assess reaction to treatment; progress accordingly; has reported dizziness with Right and Left posterior canal testing; have not yet tested horizontal canal.   8:07 AM, 10/11/22 Noel Henandez Small Yashica Sterbenz MPT [facility] [place] [phone] [phone]

## 2022-10-11 NOTE — Patient Instructions (Addendum)
1. Chronic rhinitis (grasses, ragweed, weeds, trees, indoor molds, outdoor molds, dust mites, cat, and dog) - Continue with shots at the same schedule with the epinephrine rinses.  - Hopefully this time next year will be even better with the higher dose allergen vials that you are going to be on.  - Continue taking:  Claritin (loratadine) '10mg'$  daily - We will plan for 3-5 years treatment in total (you reached the Red Vial in September 2023, so five years is September 2028).   2. Return in about 1 year (around 10/11/2023). Call us if there are issues in the meantime.   Please inform us of any Emergency Department visits, hospitalizations, or changes in symptoms. Call us before going to the ED for breathing or allergy symptoms since we might be able to fit you in for a sick visit. Feel free to contact us anytime with any questions, problems, or concerns.  It was a pleasure to see you again today!  Websites that have reliable patient information: 1. American Academy of Asthma, Allergy, and Immunology: www.aaaai.org 2. Food Allergy Research and Education (FARE): foodallergy.org 3. Mothers of Asthmatics: http://www.asthmacommunitynetwork.org 4. American College of Allergy, Asthma, and Immunology: www.acaai.org   COVID-19 Vaccine Information can be found at: [email or web address].co.uk For questions related to vaccine distribution or appointments, please email [email or web address] or call [phone].   We realize that you might be concerned about having an allergic reaction to the COVID19 vaccines. To help with that concern, WE ARE OFFERING THE COVID19 VACCINES IN OUR OFFICE! Ask the front desk for dates!     "Like" [place] on Facebook and Instagram for our latest updates!      A healthy democracy works best when [employer] participate! Make sure you are registered to vote! If you have moved or changed any of your contact information, you will  need to get this updated before voting!  In some cases, you MAY be able to register to vote online: [email or web address]

## 2022-10-13 ENCOUNTER — Ambulatory Visit (HOSPITAL_COMMUNITY): Payer: Medicare Other

## 2022-10-13 DIAGNOSIS — R42 Dizziness and giddiness: Secondary | ICD-10-CM | POA: Diagnosis not present

## 2022-10-13 NOTE — Therapy (Signed)
OUTPATIENT PHYSICAL THERAPY VESTIBULAR treatment     Patient Name: Jodi Ray MRN: [id number] DOB:10-06-1960, 62 y.o., female Today's Date: 10/13/2022  END OF SESSION:  PT End of Session - 10/13/22 1120     Visit Number 5    Number of Visits 8    Date for PT Re-Evaluation 10/23/22    Authorization Type Medicare Part A; Publishing copy    Authorization Time Period 80/20    Progress Note Due on Visit 10    PT Start Time 1120    PT Stop Time 1155    PT Time Calculation (min) 35 min    Activity Tolerance Patient tolerated treatment well    Behavior During Therapy WFL for tasks assessed/performed            Past Medical History:  Diagnosis Date   Adult RDS ([place])    Anemia    Brain aneurysm    frontal lobe   CAD (coronary artery disease)    a. 02/2018: s/p DES to Proximal LAD with residual 20% RCA stenosis.   Chronic pain    Diabetes mellitus without complication (HCC)    Headache    History of left bundle branch block (LBBB)    HTN (hypertension)    Hx of cardiovascular stress test 10/2016   intermediate risk study   Hypothyroidism    Neuromuscular disorder ([place])    Neuropathy    Obesity    OSA on CPAP 03/10/2015   Renal insufficiency    Retinopathy    Sleep apnea    Varicose veins of both lower extremities    Past Surgical History:  Procedure Laterality Date   BIOPSY  11/14/2016   Procedure: BIOPSY;  Surgeon: Danie Binder, MD;  [facility];  Service: Endoscopy;;  colon gastric duodenal   BREAST EXCISIONAL BIOPSY Right    BREAST SURGERY Left 1994   Lumpectomy   COLONOSCOPY WITH PROPOFOL N/A 11/14/2016   Dr. Oneida Alar: Moderately redundant rectosigmoid colon. Random colon biopsies benign. Internal hemorrhoids. Surveillance colonoscopy in 5 years.   COLONOSCOPY WITH PROPOFOL N/A 04/20/2022   Procedure: COLONOSCOPY WITH PROPOFOL;  Surgeon: Eloise Harman, DO;  [facility];  Service: Endoscopy;  Laterality: N/A;  12:30am    CORONARY STENT INTERVENTION N/A 03/22/2018   Procedure: CORONARY STENT INTERVENTION;  Surgeon: Burnell Blanks, MD;  Location: [facility];  Service: Cardiovascular;  Laterality: N/A;   ESOPHAGOGASTRODUODENOSCOPY (EGD) WITH PROPOFOL N/A 11/14/2016   Dr. Oneida Alar: Esophagus normal. Moderate gastritis, few gastric polyps. Duodenal biopsies negative. Fundic gland polyp gastric polyp area no H pylori.   FOOT SURGERY     LEFT HEART CATH AND CORONARY ANGIOGRAPHY N/A 03/22/2018   Procedure: LEFT HEART CATH AND CORONARY ANGIOGRAPHY;  Surgeon: Burnell Blanks, MD;  Location: [place] [facility];  Service: Cardiovascular;  Laterality: N/A;   LEG SURGERY Right    POLYPECTOMY  04/20/2022   Procedure: POLYPECTOMY;  Surgeon: Eloise Harman, DO;  [facility];  Service: Endoscopy;;   REPLACEMENT TOTAL KNEE Right    SPINAL CORD STIMULATOR IMPLANT     Patient Active Problem List   Diagnosis Date Noted   Seasonal and perennial allergic rhinitis 07/13/2021   Diabetic ketoacidosis without coma associated with type 1 diabetes mellitus ([place]) 11/15/2019   Class 1 obesity 11/15/2019   Acute renal failure superimposed on stage 3 chronic kidney disease ([place]) 11/15/2019   CAD (coronary artery disease) 04/16/2018   Unstable angina (HCC)  03/21/2018   Atypical chest pain 03/20/2018   Gastritis and gastroduodenitis 03/05/2017   Suprapubic pain 03/05/2017   Right sided abdominal pain 10/24/2016   Diarrhea 10/24/2016   Family hx of colon cancer 10/24/2016   Varicose veins of bilateral lower extremities with other complications [id number]   Insomnia with sleep apnea 03/09/2016   Type I diabetes mellitus, uncontrolled 04/30/2015   Mixed hyperlipidemia 04/30/2015   Primary hypothyroidism 04/30/2015   Benign hypertension 04/30/2015   OSA on CPAP 03/10/2015   Migraine without aura and without status migrainosus, not intractable 01/08/2015   Primary stabbing headache 10/02/2014     PCP: Monico Blitz, MD REFERRING PROVIDER: Raylene Miyamoto, MD  REFERRING DIAG: DIZZINESS  THERAPY DIAG:  Dizziness and giddiness  ONSET DATE: Burman Riis 1st 2023  Rationale for Evaluation and Treatment: Rehabilitation    SUBJECTIVE STATEMENT: Reports no dizziness now with rolling in bed but still feels "woozy" otherwise; rolling in bed "100%" better; sitting up out of bed maybe "40%" better; walking dizziness "35 to 40% better"; afraid to look up because that causes the most dizziness   PERTINENT HISTORY: history of migraine; cluster headaches; gets shots in the eyes for "water" on the optic nerve Spinal cord stimulator Trauma to right leg in a skiing accident 2003; multiple hardware  PAIN:  Are you having pain? Yes: NPRS scale: 8/10 Pain location: back and down right leg Pain description: throbbing Aggravating factors: unknown Relieving factors: stimulator  PRECAUTIONS: Fall  WEIGHT BEARING RESTRICTIONS: No  FALLS: Has patient fallen in last 6 months? Yes. Number of falls 1  LIVING ENVIRONMENT: Lives with: lives with an adult companion Lives in: House/apartment Stairs: No Has following equipment at home: Quad cane small base  PLOF: Independent with household mobility with device and Independent with community mobility with device  PATIENT GOALS: get back to the gym  OBJECTIVE:  SENSATION: Neuropathy in both feet diabetic neuropathy  POSTURE:  rounded shoulders and forward head  Cervical ROM:    Active A/PROM (deg) eval  Flexion full  Extension Full (dizzy)  Right lateral flexion   Left lateral flexion   Right rotation full  Left rotation full  (Blank rows = not tested)  BED MOBILITY:  Sit to supine CGA Supine to sit CGA  TRANSFERS: Assistive device utilized: Quad cane small base  Sit to stand: SBA Stand to sit: SBA Chair to chair: SBA Floor:     GAIT: Gait pattern:  holds to wall, decreased step length- Right, and decreased step length-  Left Distance walked: 50 ft Assistive device utilized: Quad cane small base Level of assistance: SBA Comments: " I'm dizzy need to hold to the wall"  FUNCTIONAL TESTS:  Not tested at eval  PATIENT SURVEYS:  DHI 44 (Moderate handicap)  VESTIBULAR ASSESSMENT:  GENERAL OBSERVATION: glasses   SYMPTOM BEHAVIOR:  Subjective history: see above  Non-Vestibular symptoms: headaches and migraine symptoms  Type of dizziness: Imbalance (Disequilibrium), Spinning/Vertigo, and Unsteady with head/body turns  Frequency: constant but can increase or decrease intensity  Duration: constant but intensities will subside fairly quickly 10 sec or less  Aggravating factors: Induced by position change: lying supine and supine to sit, Induced by motion: occur when walking, looking up at the ceiling, and activity in general, and Occurs when standing still   Relieving factors: head stationary, closing eyes, rest, and slow movements  Progression of symptoms: worse  OCULOMOTOR EXAM:  Ocular Alignment: normal; small pupils  Ocular ROM: No Limitations  Spontaneous Nystagmus: absent  Gaze-Induced Nystagmus: absent  Smooth Pursuits: intact  Saccades:   Convergence/Divergence:  cm     VESTIBULAR - OCULAR REFLEX:   Slow VOR: Normal   Head-Impulse Test: HIT Right: positive HIT Left: positive  Dynamic Visual Acuity: Dynamic: increased dizziness   POSITIONAL TESTING: Right Dix-Hallpike: no nystagmus and very dizzy Left Dix-Hallpike: no nystagmus and very dizzy  MOTION SENSITIVITY:  Motion Sensitivity Quotient Intensity: 0 = none, 1 = Lightheaded, 2 = Mild, 3 = Moderate, 4 = Severe, 5 = Vomiting  Intensity  1. Sitting to supine 4  2. Supine to L side   3. Supine to R side   4. Supine to sitting 4  5. L Hallpike-Dix 4  6. Up from L  4  7. R Hallpike-Dix 4  8. Up from R  4  9. Sitting, head tipped to L knee 1  10. Head up from L knee 1  11. Sitting, head tipped to R knee 1  12. Head up from R knee  2  13. Sitting head turns x5 1  14.Sitting head nods x5 3 (looking up)  15. In stance, 180 turn to L    16. In stance, 180 turn to R     OTHOSTATICS: not done  FUNCTIONAL GAIT: Dynamic Gait Index: test next visit please   VESTIBULAR TREATMENT:                                                                                                  DATE:  10/13/22 Right sidelying test for right posterior canal negative today Left sidelying test + for dizziness Semont Maneuver x 2 for left posterior canal Education on [employer] Exercises; review of HEP   10/11/22 Right sidelying test + for dizziness (although 20% better than before) Semont Maneuver for Right post canal x 1 Left sidelying test + for dizziness (worse than right) Semont Maneuver for Left posterior canal x 1   10/06/22 Semont Maneuver for the R post SCC x 2 reps Seated horizontal saccades, reading words x 1' Patient education (see below)  10/04/2022 Heat to low back while supine. Eye horizontal motion no sx Eye vertical x 5 (+) dizziness.   Dix halpike RT no nystagmus but Dizzy completed [place] based on sx.   Sitting: Sit tall x 5 Scapular retraction x 5 Cervical retraction x 5 Eye motion x 10  09/25/22 physical therapy evaluation and HEP instruction  Canalith Repositioning:   Gaze Adaptation:  x1 Viewing Horizontal: Position: sitting, Time: 3, and Reps: 10 and x1 Viewing Vertical:  Position: sitting, Time: 3, and Reps: 10 Habituation:   Other:   PATIENT EDUCATION: Education details: Updated HEP. Educated on the pathoanatomy of BPPV and vestibular hypofunction. Educated on proper positioning (e.g. when sleeping) post-canalith repositioning maneuver  Person educated: Patient Education method: Explanation, Demonstration, and Handouts Education comprehension: verbalized understanding, returned demonstration, verbal cues required, and tactile cues required   HOME EXERCISE PROGRAM: 10/06/22 GAZE STABILIZATION  EXERCISES (2-3x/day, 7 days/week) These exercises are designed to improve your eyes' ability to track objects without getting dizzy. When performing these exercises, make  sure you are facing a blank wall (like a white wall) to avoid distraction. You can wear your glasses/contact lenses if you must.   Before anything, warm-up your neck muscles by bending it forward and back 10 times, side-to-side 10 times, and slowly turn your head left and right 10 times.  Exercise 1 (Horizontal Saccades) Use two cards for this exercise While holding the cards on each hand side by side in sitting with arms outstretched, look at the first item on the other card then look at the next item on the second card then alternate your gaze sequentially. Do NOT move your cards nor the your head. Do the following for one minute each: Identifying numbers Reading words  10/04/22 Access Code: [email or web address] URL: [email or web address] Date: 10/04/2022 Prepared by: Rayetta Humphrey  Exercises - Seated Cervical Retraction  - 1 x daily - 7 x weekly - 3 sets - 10 reps - Seated Scapular Retraction  - 1 x daily - 7 x weekly - 3 sets - 10 reps - Seated Eye and Head Movement Coordination - Side to Side  - 1 x daily - 7 x weekly - 3 sets - 10 reps Access Code: [email or web address] URL: [email or web address] Date: 09/25/2022 Prepared by: [facility]  Exercises - Seated Gaze Stabilization with Head Rotation  - 2 x daily - 7 x weekly - 2 sets - 10 reps - Seated Gaze Stabilization with Head Nod  - 2 x daily - 7 x weekly - 2 sets - 10 reps  GOALS: Goals reviewed with patient? No  SHORT TERM GOALS: Target date: 10/09/2022  patient will be independent with initial HEP  Baseline: Goal status: MET  2.  Patient will self report 30% improvement to improve tolerance for functional activity   Baseline: met 10/13/22 Goal status: MET   LONG TERM GOALS: Target date: 10/23/2022  Patient will be independent in self  management strategies to improve quality of life and functional outcomes Baseline:  Goal status: IN PROGRESS  2.  Patient will self report 50% improvement to improve tolerance for functional activity   Baseline:  Goal status: IN PROGRESS  3.  Patient will remain free of falls Baseline:  Goal status: IN PROGRESS  4.  Patient will improve her score on the [facility] by 10 points to demonstrate decreased dizziness with everyday tasks.  Baseline:  Goal status: IN PROGRESS  5.  Patient will be able to walk with LRAD without feeling dizzy while looking up at upper shelves in the supermarket Baseline:  Goal status: IN PROGRESS  ASSESSMENT:  CLINICAL IMPRESSION: Today patient reports some improvement; repeat testing Right sidelying negative today; left sidelying positive so repeated Semont for left posterior canal; on second testing minimal dizziness; greatly reduced;  issued Nestor Lewandowsky and educated patient on what to do following todays treatment with verbalization of understanding.   Patient will benefit from continued skilled therapy services to address deficits and promote return to optimal function.      OBJECTIVE IMPAIRMENTS: Abnormal gait, decreased activity tolerance, decreased balance, decreased coordination, decreased endurance, decreased mobility, difficulty walking, dizziness, impaired perceived functional ability, and pain.   ACTIVITY LIMITATIONS: carrying, lifting, bending, sitting, standing, squatting, sleeping, stairs, transfers, bed mobility, reach over head, hygiene/grooming, locomotion level, and caring for others  PARTICIPATION LIMITATIONS: meal prep, cleaning, laundry, shopping, and community activity  REHAB POTENTIAL: Good  CLINICAL DECISION MAKING: Evolving/moderate complexity  EVALUATION COMPLEXITY: Moderate   PLAN:  PT FREQUENCY: 2x/week  PT DURATION: 4 weeks  PLANNED INTERVENTIONS:  Therapeutic exercises, Therapeutic activity, Neuromuscular re-education,  Balance training, Gait training, Patient/Family education, Joint manipulation, Joint mobilization, Stair training, Orthotic/Fit training, DME instructions, Aquatic Therapy, Dry Needling, Electrical stimulation, Spinal manipulation, Spinal mobilization, Cryotherapy, Moist heat, Compression bandaging, scar mobilization, Splintting, Taping, Traction, Ultrasound, Ionotophoresis 4mg /ml Dexamethasone, and Manual therapy   PLAN FOR NEXT SESSION:  assess reaction to treatment; progress accordingly  11:59 AM, 10/13/22 Akeila Lana Small Tiffiney Sparrow MPT [place] [facility] [place] [phone] Ph:[phone]

## 2022-10-16 ENCOUNTER — Ambulatory Visit (HOSPITAL_COMMUNITY): Payer: Medicare Other

## 2022-10-16 DIAGNOSIS — R809 Proteinuria, unspecified: Secondary | ICD-10-CM | POA: Diagnosis not present

## 2022-10-16 DIAGNOSIS — E1122 Type 2 diabetes mellitus with diabetic chronic kidney disease: Secondary | ICD-10-CM | POA: Diagnosis not present

## 2022-10-16 DIAGNOSIS — R42 Dizziness and giddiness: Secondary | ICD-10-CM | POA: Diagnosis not present

## 2022-10-16 DIAGNOSIS — E1129 Type 2 diabetes mellitus with other diabetic kidney complication: Secondary | ICD-10-CM | POA: Diagnosis not present

## 2022-10-16 DIAGNOSIS — I129 Hypertensive chronic kidney disease with stage 1 through stage 4 chronic kidney disease, or unspecified chronic kidney disease: Secondary | ICD-10-CM | POA: Diagnosis not present

## 2022-10-16 DIAGNOSIS — E211 Secondary hyperparathyroidism, not elsewhere classified: Secondary | ICD-10-CM | POA: Diagnosis not present

## 2022-10-16 DIAGNOSIS — I5032 Chronic diastolic (congestive) heart failure: Secondary | ICD-10-CM | POA: Diagnosis not present

## 2022-10-16 DIAGNOSIS — D638 Anemia in other chronic diseases classified elsewhere: Secondary | ICD-10-CM | POA: Diagnosis not present

## 2022-10-16 DIAGNOSIS — N189 Chronic kidney disease, unspecified: Secondary | ICD-10-CM | POA: Diagnosis not present

## 2022-10-16 NOTE — Therapy (Signed)
OUTPATIENT PHYSICAL THERAPY VESTIBULAR TREATMENT     Patient Name: Jodi Ray MRN: [id number] DOB:05-14-1961, 62 y.o., female Today's Date: 10/16/2022  END OF SESSION:  PT End of Session - 10/16/22 1032     Visit Number 6    Number of Visits 8    Date for PT Re-Evaluation 10/23/22    Authorization Type Medicare Part A; [employer], multimedia employee    Authorization Time Period 80/20    Progress Note Due on Visit 10    PT Start Time 1032    PT Stop Time 1101    PT Time Calculation (min) 29 min    Activity Tolerance Patient tolerated treatment well    Behavior During Therapy WFL for tasks assessed/performed            Past Medical History:  Diagnosis Date   Adult RDS ([place])    Anemia    Brain aneurysm    frontal lobe   CAD (coronary artery disease)    a. 02/2018: s/p DES to Proximal LAD with residual 20% RCA stenosis.   Chronic pain    Diabetes mellitus without complication (HCC)    Headache    History of left bundle branch block (LBBB)    HTN (hypertension)    Hx of cardiovascular stress test 10/2016   intermediate risk study   Hypothyroidism    Neuromuscular disorder ([place])    Neuropathy    Obesity    OSA on CPAP 03/10/2015   Renal insufficiency    Retinopathy    Sleep apnea    Varicose veins of both lower extremities    Past Surgical History:  Procedure Laterality Date   BIOPSY  11/14/2016   Procedure: BIOPSY;  Surgeon: Danie Binder, MD;  [facility];  Service: Endoscopy;;  colon gastric duodenal   BREAST EXCISIONAL BIOPSY Right    BREAST SURGERY Left 1994   Lumpectomy   COLONOSCOPY WITH PROPOFOL N/A 11/14/2016   Dr. Oneida Alar: Moderately redundant rectosigmoid colon. Random colon biopsies benign. Internal hemorrhoids. Surveillance colonoscopy in 5 years.   COLONOSCOPY WITH PROPOFOL N/A 04/20/2022   Procedure: COLONOSCOPY WITH PROPOFOL;  Surgeon: Eloise Harman, DO;  [facility];  Service: Endoscopy;  Laterality: N/A;  12:30am    CORONARY STENT INTERVENTION N/A 03/22/2018   Procedure: CORONARY STENT INTERVENTION;  Surgeon: Burnell Blanks, MD;  Location: [place] [facility];  Service: Cardiovascular;  Laterality: N/A;   ESOPHAGOGASTRODUODENOSCOPY (EGD) WITH PROPOFOL N/A 11/14/2016   Dr. Oneida Alar: Esophagus normal. Moderate gastritis, few gastric polyps. Duodenal biopsies negative. Fundic gland polyp gastric polyp area no H pylori.   FOOT SURGERY     LEFT HEART CATH AND CORONARY ANGIOGRAPHY N/A 03/22/2018   Procedure: LEFT HEART CATH AND CORONARY ANGIOGRAPHY;  Surgeon: Burnell Blanks, MD;  Location: [place] [facility];  Service: Cardiovascular;  Laterality: N/A;   LEG SURGERY Right    POLYPECTOMY  04/20/2022   Procedure: POLYPECTOMY;  Surgeon: Eloise Harman, DO;  [facility];  Service: Endoscopy;;   REPLACEMENT TOTAL KNEE Right    SPINAL CORD STIMULATOR IMPLANT     Patient Active Problem List   Diagnosis Date Noted   Seasonal and perennial allergic rhinitis 07/13/2021   Diabetic ketoacidosis without coma associated with type 1 diabetes mellitus ([place]) 11/15/2019   Class 1 obesity 11/15/2019   Acute renal failure superimposed on stage 3 chronic kidney disease ([place]) 11/15/2019   CAD (coronary artery disease) 04/16/2018   Unstable angina (HCC)  03/21/2018   Atypical chest pain 03/20/2018   Gastritis and gastroduodenitis 03/05/2017   Suprapubic pain 03/05/2017   Right sided abdominal pain 10/24/2016   Diarrhea 10/24/2016   Family hx of colon cancer 10/24/2016   Varicose veins of bilateral lower extremities with other complications [id number]   Insomnia with sleep apnea 03/09/2016   Type I diabetes mellitus, uncontrolled 04/30/2015   Mixed hyperlipidemia 04/30/2015   Primary hypothyroidism 04/30/2015   Benign hypertension 04/30/2015   OSA on CPAP 03/10/2015   Migraine without aura and without status migrainosus, not intractable 01/08/2015   Primary stabbing headache 10/02/2014     PCP: Monico Blitz, MD REFERRING PROVIDER: Raylene Miyamoto, MD  REFERRING DIAG: DIZZINESS  THERAPY DIAG:  Dizziness and giddiness  ONSET DATE: Burman Riis 1st 2023  Rationale for Evaluation and Treatment: Rehabilitation    SUBJECTIVE STATEMENT: Doing better with head turns/ reading words;" I can read the words better and faster" ; started doing the [employer] exercises on Sunday.  This morning felt off balance when took the dog out and it was dark.    PERTINENT HISTORY: history of migraine; cluster headaches; gets shots in the eyes for "water" on the optic nerve Spinal cord stimulator Trauma to right leg in a skiing accident 2003; multiple hardware  PAIN:  Are you having pain? Yes: NPRS scale: 8/10 Pain location: back and down right leg Pain description: throbbing Aggravating factors: unknown Relieving factors: stimulator  PRECAUTIONS: Fall  WEIGHT BEARING RESTRICTIONS: No  FALLS: Has patient fallen in last 6 months? Yes. Number of falls 1  LIVING ENVIRONMENT: Lives with: lives with an adult companion Lives in: House/apartment Stairs: No Has following equipment at home: Quad cane small base  PLOF: Independent with household mobility with device and Independent with community mobility with device  PATIENT GOALS: get back to the gym  OBJECTIVE:  SENSATION: Neuropathy in both feet diabetic neuropathy  POSTURE:  rounded shoulders and forward head  Cervical ROM:    Active A/PROM (deg) eval  Flexion full  Extension Full (dizzy)  Right lateral flexion   Left lateral flexion   Right rotation full  Left rotation full  (Blank rows = not tested)  BED MOBILITY:  Sit to supine CGA Supine to sit CGA  TRANSFERS: Assistive device utilized: Quad cane small base  Sit to stand: SBA Stand to sit: SBA Chair to chair: SBA Floor:     GAIT: Gait pattern:  holds to wall, decreased step length- Right, and decreased step length- Left Distance walked: 50  ft Assistive device utilized: Quad cane small base Level of assistance: SBA Comments: " I'm dizzy need to hold to the wall"  FUNCTIONAL TESTS:  Not tested at eval  PATIENT SURVEYS:  DHI 44 (Moderate handicap)  VESTIBULAR ASSESSMENT:  GENERAL OBSERVATION: glasses   SYMPTOM BEHAVIOR:  Subjective history: see above  Non-Vestibular symptoms: headaches and migraine symptoms  Type of dizziness: Imbalance (Disequilibrium), Spinning/Vertigo, and Unsteady with head/body turns  Frequency: constant but can increase or decrease intensity  Duration: constant but intensities will subside fairly quickly 10 sec or less  Aggravating factors: Induced by position change: lying supine and supine to sit, Induced by motion: occur when walking, looking up at the ceiling, and activity in general, and Occurs when standing still   Relieving factors: head stationary, closing eyes, rest, and slow movements  Progression of symptoms: worse  OCULOMOTOR EXAM:  Ocular Alignment: normal; small pupils  Ocular ROM: No Limitations  Spontaneous Nystagmus: absent  Gaze-Induced Nystagmus:  absent  Smooth Pursuits: intact  Saccades:   Convergence/Divergence:  cm     VESTIBULAR - OCULAR REFLEX:   Slow VOR: Normal   Head-Impulse Test: HIT Right: positive HIT Left: positive  Dynamic Visual Acuity: Dynamic: increased dizziness   POSITIONAL TESTING: Right Dix-Hallpike: no nystagmus and very dizzy Left Dix-Hallpike: no nystagmus and very dizzy  MOTION SENSITIVITY:  Motion Sensitivity Quotient Intensity: 0 = none, 1 = Lightheaded, 2 = Mild, 3 = Moderate, 4 = Severe, 5 = Vomiting  Intensity  1. Sitting to supine 4  2. Supine to L side   3. Supine to R side   4. Supine to sitting 4  5. L Hallpike-Dix 4  6. Up from L  4  7. R Hallpike-Dix 4  8. Up from R  4  9. Sitting, head tipped to L knee 1  10. Head up from L knee 1  11. Sitting, head tipped to R knee 1  12. Head up from R knee 2  13. Sitting head turns  x5 1  14.Sitting head nods x5 3 (looking up)  15. In stance, 180 turn to L    16. In stance, 180 turn to R     OTHOSTATICS: not done  FUNCTIONAL GAIT: Dynamic Gait Index: test next visit please   VESTIBULAR TREATMENT:                                                                                                  DATE:  10/16/22 Right sidelying test negative Left sidelying test mild positive (dizziness lasting about 3 seconds) Semont manuveur for left posterior canal BPPV Seated: Gaze stabilization with head turns and nods 3 x 10  10/13/22 Right sidelying test for right posterior canal negative today Left sidelying test + for dizziness Semont Maneuver x 2 for left posterior canal Education on [employer] Exercises; review of HEP   10/11/22 Right sidelying test + for dizziness (although 20% better than before) Semont Maneuver for Right post canal x 1 Left sidelying test + for dizziness (worse than right) Semont Maneuver for Left posterior canal x 1   10/06/22 Semont Maneuver for the R post SCC x 2 reps Seated horizontal saccades, reading words x 1' Patient education (see below)  10/04/2022 Heat to low back while supine. Eye horizontal motion no sx Eye vertical x 5 (+) dizziness.   Dix halpike RT no nystagmus but Dizzy completed [place] based on sx.   Sitting: Sit tall x 5 Scapular retraction x 5 Cervical retraction x 5 Eye motion x 10  09/25/22 physical therapy evaluation and HEP instruction  Canalith Repositioning:   Gaze Adaptation:  x1 Viewing Horizontal: Position: sitting, Time: 3, and Reps: 10 and x1 Viewing Vertical:  Position: sitting, Time: 3, and Reps: 10 Habituation:   Other:   PATIENT EDUCATION: Education details: Updated HEP. Educated on the pathoanatomy of BPPV and vestibular hypofunction. Educated on proper positioning (e.g. when sleeping) post-canalith repositioning maneuver  Person educated: Patient Education method: Explanation, Demonstration,  and Handouts Education comprehension: verbalized understanding, returned demonstration, verbal cues required, and tactile cues required  HOME EXERCISE PROGRAM: 10/06/22 GAZE STABILIZATION EXERCISES (2-3x/day, 7 days/week) These exercises are designed to improve your eyes' ability to track objects without getting dizzy. When performing these exercises, make sure you are facing a blank wall (like a white wall) to avoid distraction. You can wear your glasses/contact lenses if you must.   Before anything, warm-up your neck muscles by bending it forward and back 10 times, side-to-side 10 times, and slowly turn your head left and right 10 times.  Exercise 1 (Horizontal Saccades) Use two cards for this exercise While holding the cards on each hand side by side in sitting with arms outstretched, look at the first item on the other card then look at the next item on the second card then alternate your gaze sequentially. Do NOT move your cards nor the your head. Do the following for one minute each: Identifying numbers Reading words  10/04/22 Access Code: [email or web address] URL: [email or web address] Date: 10/04/2022 Prepared by: Rayetta Humphrey  Exercises - Seated Cervical Retraction  - 1 x daily - 7 x weekly - 3 sets - 10 reps - Seated Scapular Retraction  - 1 x daily - 7 x weekly - 3 sets - 10 reps - Seated Eye and Head Movement Coordination - Side to Side  - 1 x daily - 7 x weekly - 3 sets - 10 reps Access Code: [email or web address] URL: [email or web address] Date: 09/25/2022 Prepared by: [facility]  Exercises - Seated Gaze Stabilization with Head Rotation  - 2 x daily - 7 x weekly - 2 sets - 10 reps - Seated Gaze Stabilization with Head Nod  - 2 x daily - 7 x weekly - 2 sets - 10 reps  GOALS: Goals reviewed with patient? No  SHORT TERM GOALS: Target date: 10/09/2022  patient will be independent with initial HEP  Baseline: Goal status: MET  2.  Patient will self report 30%  improvement to improve tolerance for functional activity   Baseline: met 10/13/22 Goal status: MET   LONG TERM GOALS: Target date: 10/23/2022  Patient will be independent in self management strategies to improve quality of life and functional outcomes Baseline:  Goal status: IN PROGRESS  2.  Patient will self report 50% improvement to improve tolerance for functional activity   Baseline:  Goal status: IN PROGRESS  3.  Patient will remain free of falls Baseline:  Goal status: IN PROGRESS  4.  Patient will improve her score on the [facility] by 10 points to demonstrate decreased dizziness with everyday tasks.  Baseline:  Goal status: IN PROGRESS  5.  Patient will be able to walk with LRAD without feeling dizzy while looking up at upper shelves in the supermarket Baseline:  Goal status: IN PROGRESS  ASSESSMENT:  CLINICAL IMPRESSION: discussed with patient the components of balance; eye sight, inner ear/vestibular system and joint proprioception. Mild positive test left sidelying so treated with Semont.  Progressed gaze stabilization with head movement today.  Updated HEP.  Patient with dizziness with head turns; instructed her to slow her pace until she can perform with minimal to moderate dizziness  Patient will benefit from continued skilled therapy services to address deficits and promote return to optimal function.      OBJECTIVE IMPAIRMENTS: Abnormal gait, decreased activity tolerance, decreased balance, decreased coordination, decreased endurance, decreased mobility, difficulty walking, dizziness, impaired perceived functional ability, and pain.   ACTIVITY LIMITATIONS: carrying, lifting, bending, sitting, standing, squatting, sleeping, stairs, transfers, bed mobility, reach over head, hygiene/grooming, locomotion level, and caring for others  PARTICIPATION LIMITATIONS: meal prep, cleaning, laundry, shopping, and community activity  REHAB POTENTIAL: Good  CLINICAL DECISION  MAKING: Evolving/moderate complexity  EVALUATION COMPLEXITY: Moderate   PLAN:  PT FREQUENCY: 2x/week  PT DURATION: 4 weeks  PLANNED INTERVENTIONS: Therapeutic exercises, Therapeutic activity, Neuromuscular re-education, Balance training, Gait training, Patient/Family education, Joint manipulation, Joint mobilization, Stair training, Orthotic/Fit training, DME instructions, Aquatic Therapy, Dry Needling, Electrical stimulation, Spinal manipulation, Spinal mobilization, Cryotherapy, Moist heat, Compression bandaging, scar mobilization, Splintting, Taping, Traction, Ultrasound, Ionotophoresis 4mg /ml Dexamethasone, and Manual therapy   PLAN FOR NEXT SESSION:  assess reaction to treatment; progress accordingly; progress gaze stabilization; sees Dr. Marylene Buerger today  11:03 AM, 10/16/22 Yi Falletta Small Makana Rostad MPT [facility] [place] [phone] [id number]

## 2022-10-18 ENCOUNTER — Ambulatory Visit (HOSPITAL_COMMUNITY): Payer: Medicare Other

## 2022-10-18 ENCOUNTER — Ambulatory Visit (INDEPENDENT_AMBULATORY_CARE_PROVIDER_SITE_OTHER): Payer: Medicare Other

## 2022-10-18 DIAGNOSIS — R42 Dizziness and giddiness: Secondary | ICD-10-CM

## 2022-10-18 DIAGNOSIS — J309 Allergic rhinitis, unspecified: Secondary | ICD-10-CM

## 2022-10-18 NOTE — Therapy (Signed)
OUTPATIENT PHYSICAL THERAPY VESTIBULAR / PROGRESS NOTE/DISCHARGE SUMMARY Progress Note Reporting Period 09/25/2022 to 10/18/2022  See note below for Objective Data and Assessment of Progress/Goals.    PHYSICAL THERAPY DISCHARGE SUMMARY  Visits from Start of Care: 7  Current functional level related to goals / functional outcomes: See below   Remaining deficits: See below   Education / Equipment: See below   Patient agrees to discharge. Patient goals were partially met. Patient is being discharged due to being pleased with the current functional level.        Patient Name: Jodi Ray MRN: [id number] DOB:03-20-1961, 62 y.o., female Today's Date: 10/18/2022  END OF SESSION:  PT End of Session - 10/18/22 1034     Visit Number 7    Number of Visits 8    Date for PT Re-Evaluation 10/23/22    Authorization Type Medicare Part A; [employer], [employer] employee    Authorization Time Period 80/20    Progress Note Due on Visit 10    PT Start Time 1033    PT Stop Time 1057    PT Time Calculation (min) 24 min    Activity Tolerance Patient tolerated treatment well    Behavior During Therapy WFL for tasks assessed/performed            Past Medical History:  Diagnosis Date   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 02/2018: s/p DES to Proximal LAD with residual 20% RCA stenosis.   Chronic pain    Diabetes mellitus without complication (HCC)    Headache    History of left bundle branch block (LBBB)    HTN (hypertension)    Hx of cardiovascular stress test 10/2016   intermediate risk study   Hypothyroidism    Neuromuscular disorder ([place])    Neuropathy    Obesity    OSA on CPAP 03/10/2015   Renal insufficiency    Retinopathy    Sleep apnea    Varicose veins of both lower extremities    Past Surgical History:  Procedure Laterality Date   BIOPSY  11/14/2016   Procedure: BIOPSY;  Surgeon: Danie Binder,  MD;  [facility];  Service: Endoscopy;;  colon gastric duodenal   BREAST EXCISIONAL BIOPSY Right    BREAST SURGERY Left 1994   Lumpectomy   COLONOSCOPY WITH PROPOFOL N/A 11/14/2016   Dr. Oneida Alar: Moderately redundant rectosigmoid colon. Random colon biopsies benign. Internal hemorrhoids. Surveillance colonoscopy in 5 years.   COLONOSCOPY WITH PROPOFOL N/A 04/20/2022   Procedure: COLONOSCOPY WITH PROPOFOL;  Surgeon: Eloise Harman, DO;  [facility];  Service: Endoscopy;  Laterality: N/A;  12:30am   CORONARY STENT INTERVENTION N/A 03/22/2018   Procedure: CORONARY STENT INTERVENTION;  Surgeon: Burnell Blanks, MD;  Location: [place] [facility];  Service: Cardiovascular;  Laterality: N/A;   ESOPHAGOGASTRODUODENOSCOPY (EGD) WITH PROPOFOL N/A 11/14/2016   Dr. Oneida Alar: Esophagus normal. Moderate gastritis, few gastric polyps. Duodenal biopsies negative. Fundic gland polyp gastric polyp area no H pylori.   FOOT SURGERY     LEFT HEART CATH AND CORONARY ANGIOGRAPHY N/A 03/22/2018   Procedure: LEFT HEART CATH AND CORONARY ANGIOGRAPHY;  Surgeon: Burnell Blanks, MD;  Location: [place] [facility];  Service: Cardiovascular;  Laterality: N/A;   LEG SURGERY Right    POLYPECTOMY  04/20/2022   Procedure: POLYPECTOMY;  Surgeon: Eloise Harman, DO;  [facility];  Service: Endoscopy;;   REPLACEMENT TOTAL KNEE Right    SPINAL CORD STIMULATOR IMPLANT     Patient Active Problem List   Diagnosis Date Noted   Seasonal and perennial allergic rhinitis 07/13/2021   Diabetic ketoacidosis without coma associated with type 1 diabetes mellitus ([place]) 11/15/2019   Class 1 obesity 11/15/2019   Acute renal failure superimposed on stage 3 chronic kidney disease ([place]) 11/15/2019   CAD (coronary artery disease) 04/16/2018   Unstable angina ([place]) 03/21/2018   Atypical chest pain 03/20/2018   Gastritis and gastroduodenitis 03/05/2017   Suprapubic pain 03/05/2017   Right  sided abdominal pain 10/24/2016   Diarrhea 10/24/2016   Family hx of colon cancer 10/24/2016   Varicose veins of bilateral lower extremities with other complications [id number]   Insomnia with sleep apnea 03/09/2016   Type I diabetes mellitus, uncontrolled 04/30/2015   Mixed hyperlipidemia 04/30/2015   Primary hypothyroidism 04/30/2015   Benign hypertension 04/30/2015   OSA on CPAP 03/10/2015   Migraine without aura and without status migrainosus, not intractable 01/08/2015   Primary stabbing headache 10/02/2014    PCP: Monico Blitz, MD REFERRING PROVIDER: Raylene Miyamoto, MD  REFERRING DIAG: DIZZINESS  THERAPY DIAG:  Dizziness and giddiness  ONSET DATE: Burman Riis 1st 2023  Rationale for Evaluation and Treatment: Rehabilitation    SUBJECTIVE STATEMENT: Overall about 55 to 60% better.  Reports able to move her head faster with nods and turns with gaze stabilization; compliant with HEP; still has trouble with looking up    PERTINENT HISTORY: history of migraine; cluster headaches; gets shots in the eyes for "water" on the optic nerve Spinal cord stimulator Trauma to right leg in a skiing accident 2003; multiple hardware  PAIN:  Are you having pain? Yes: NPRS scale: 8/10 Pain location: back and down right leg Pain description: throbbing Aggravating factors: unknown Relieving factors: stimulator  PRECAUTIONS: Fall  WEIGHT BEARING RESTRICTIONS: No  FALLS: Has patient fallen in last 6 months? Yes. Number of falls 1  LIVING ENVIRONMENT: Lives with: lives with an adult companion Lives in: House/apartment Stairs: No Has following equipment at home: Quad cane small base  PLOF: Independent with household mobility with device and Independent with community mobility with device  PATIENT GOALS: get back to the gym  OBJECTIVE:  SENSATION: Neuropathy in both feet diabetic neuropathy  POSTURE:  rounded shoulders and forward head  Cervical ROM:    Active A/PROM  (deg) eval  Flexion full  Extension Full (dizzy)  Right lateral flexion   Left lateral flexion   Right rotation full  Left rotation full  (Blank rows = not tested)  BED MOBILITY:  Sit to supine CGA Supine to sit CGA  TRANSFERS: Assistive device utilized: Quad cane small base  Sit to stand: SBA Stand to sit: SBA Chair to chair: SBA Floor:     GAIT: Gait pattern:  holds to wall, decreased step length- Right, and decreased step length- Left Distance walked: 50 ft Assistive device utilized: Quad cane small base Level of assistance: SBA Comments: " I'm dizzy need to hold to the wall"  FUNCTIONAL TESTS:  Not tested at eval  PATIENT SURVEYS:  DHI 44 (Moderate handicap)  VESTIBULAR ASSESSMENT:  GENERAL OBSERVATION: glasses   SYMPTOM BEHAVIOR:  Subjective history: see above  Non-Vestibular symptoms: headaches and migraine symptoms  Type of dizziness: Imbalance (Disequilibrium), Spinning/Vertigo, and Unsteady with head/body turns  Frequency: constant but can increase or decrease intensity  Duration: constant but intensities will  subside fairly quickly 10 sec or less  Aggravating factors: Induced by position change: lying supine and supine to sit, Induced by motion: occur when walking, looking up at the ceiling, and activity in general, and Occurs when standing still   Relieving factors: head stationary, closing eyes, rest, and slow movements  Progression of symptoms: worse  OCULOMOTOR EXAM:  Ocular Alignment: normal; small pupils  Ocular ROM: No Limitations  Spontaneous Nystagmus: absent  Gaze-Induced Nystagmus: absent  Smooth Pursuits: intact  Saccades:   Convergence/Divergence:  cm     VESTIBULAR - OCULAR REFLEX:   Slow VOR: Normal   Head-Impulse Test: HIT Right: positive HIT Left: positive  Dynamic Visual Acuity: Dynamic: increased dizziness   POSITIONAL TESTING: Right Dix-Hallpike: no nystagmus and very dizzy Left Dix-Hallpike: no nystagmus and very  dizzy  MOTION SENSITIVITY:  Motion Sensitivity Quotient Intensity: 0 = none, 1 = Lightheaded, 2 = Mild, 3 = Moderate, 4 = Severe, 5 = Vomiting  Intensity  1. Sitting to supine 4  2. Supine to L side   3. Supine to R side   4. Supine to sitting 4  5. L Hallpike-Dix 4  6. Up from L  4  7. R Hallpike-Dix 4  8. Up from R  4  9. Sitting, head tipped to L knee 1  10. Head up from L knee 1  11. Sitting, head tipped to R knee 1  12. Head up from R knee 2  13. Sitting head turns x5 1  14.Sitting head nods x5 3 (looking up)  15. In stance, 180 turn to L    16. In stance, 180 turn to R     OTHOSTATICS: not done  FUNCTIONAL GAIT: Dynamic Gait Index: test next visit please   VESTIBULAR TREATMENT:                                                                                                  DATE:  10/18/22 Progress note [place] 18 Review of HEP; updating HEP    10/16/22 Right sidelying test negative Left sidelying test mild positive (dizziness lasting about 3 seconds) Semont manuveur for left posterior canal BPPV Seated: Gaze stabilization with head turns and nods 3 x 10  10/13/22 Right sidelying test for right posterior canal negative today Left sidelying test + for dizziness Semont Maneuver x 2 for left posterior canal Education on [employer] Exercises; review of HEP   10/11/22 Right sidelying test + for dizziness (although 20% better than before) Semont Maneuver for Right post canal x 1 Left sidelying test + for dizziness (worse than right) Semont Maneuver for Left posterior canal x 1   10/06/22 Semont Maneuver for the R post SCC x 2 reps Seated horizontal saccades, reading words x 1' Patient education (see below)  10/04/2022 Heat to low back while supine. Eye horizontal motion no sx Eye vertical x 5 (+) dizziness.   Dix halpike RT no nystagmus but Dizzy completed [place] based on sx.   Sitting: Sit tall x 5 Scapular retraction x 5 Cervical retraction x 5 Eye  motion x 10  09/25/22 physical therapy evaluation and HEP instruction  Canalith Repositioning:   Gaze Adaptation:  x1 Viewing Horizontal: Position: sitting, Time: 3, and Reps: 10 and x1 Viewing Vertical:  Position: sitting, Time: 3, and Reps: 10 Habituation:   Other:   PATIENT EDUCATION: Education details: Updated HEP. Educated on the pathoanatomy of BPPV and vestibular hypofunction. Educated on proper positioning (e.g. when sleeping) post-canalith repositioning maneuver  Person educated: Patient Education method: Explanation, Demonstration, and Handouts Education comprehension: verbalized understanding, returned demonstration, verbal cues required, and tactile cues required   HOME EXERCISE PROGRAM: Access Code: [email or web address] URL: [email or web address] Date: 10/18/2022 Prepared by: [facility]  Exercises - Seated Cervical Retraction  - 1 x daily - 7 x weekly - 3 sets - 10 reps - Seated Scapular Retraction  - 1 x daily - 7 x weekly - 3 sets - 10 reps - Seated Eye and Head Movement Coordination - Side to Side  - 1 x daily - 7 x weekly - 3 sets - 10 reps - Brandt-Daroff Vestibular Exercise  - 1 x daily - 7 x weekly - 1 sets - 5 reps - Seated Gaze Stabilization with Head Rotation  - 1 x daily - 7 x weekly - 3 sets - 10 reps - Seated Gaze Stabilization with Head Nod  - 1 x daily - 7 x weekly - 3 sets - 10 reps - Standing Gaze Stabilization with Two Near Targets and Head Rotation  - 2 x daily - 7 x weekly - 1 sets - 10 reps - Seated Gaze Stabilization with Head Rotation and Horizontal Arm Movement  - 2 x daily - 7 x weekly - 1 sets - 10 reps - Seated Gaze Stabilization with Head Nod and Vertical Arm Movement  - 2 x daily - 7 x weekly - 1 sets - 10 reps  10/06/22 GAZE STABILIZATION EXERCISES (2-3x/day, 7 days/week) These exercises are designed to improve your eyes' ability to track objects without getting dizzy. When performing these exercises, make sure you are facing a blank wall  (like a white wall) to avoid distraction. You can wear your glasses/contact lenses if you must.   Before anything, warm-up your neck muscles by bending it forward and back 10 times, side-to-side 10 times, and slowly turn your head left and right 10 times.  Exercise 1 (Horizontal Saccades) Use two cards for this exercise While holding the cards on each hand side by side in sitting with arms outstretched, look at the first item on the other card then look at the next item on the second card then alternate your gaze sequentially. Do NOT move your cards nor the your head. Do the following for one minute each: Identifying numbers Reading words  10/04/22 Access Code: [email or web address] URL: [email or web address] Date: 10/04/2022 Prepared by: Rayetta Humphrey  Exercises - Seated Cervical Retraction  - 1 x daily - 7 x weekly - 3 sets - 10 reps - Seated Scapular Retraction  - 1 x daily - 7 x weekly - 3 sets - 10 reps - Seated Eye and Head Movement Coordination - Side to Side  - 1 x daily - 7 x weekly - 3 sets - 10 reps Access Code: [email or web address] URL: [email or web address] Date: 09/25/2022 Prepared by: [facility]  Exercises - Seated Gaze Stabilization with Head Rotation  - 2 x daily - 7 x weekly - 2 sets - 10 reps - Seated Gaze Stabilization with Head Nod  - 2 x daily - 7 x weekly - 2 sets -  10 reps  GOALS: Goals reviewed with patient? No  SHORT TERM GOALS: Target date: 10/09/2022  patient will be independent with initial HEP  Baseline: Goal status: MET  2.  Patient will self report 30% improvement to improve tolerance for functional activity   Baseline: met 10/13/22 Goal status: MET   LONG TERM GOALS: Target date: 10/23/2022  Patient will be independent in self management strategies to improve quality of life and functional outcomes Baseline:  Goal status: MET  2.  Patient will self report 50% improvement to improve tolerance for functional activity   Baseline:   Goal status: MET  3.  Patient will remain free of falls Baseline:  Goal status: MET  4.  Patient will improve her score on the [facility] by 10 points to demonstrate decreased dizziness with everyday tasks.  Baseline: 18 10/18/22 Goal status: MET  5.  Patient will be able to walk with LRAD without feeling dizzy while looking up at upper shelves in the supermarket Baseline:  Goal status: IN PROGRESS  ASSESSMENT:  CLINICAL IMPRESSION: Progress note today as patient feels she has reached max benefit with therapy.  She has met 2/2 STG's and 4/5 LTG's.  She is agreeable to discharge at this time. Patient will benefit from continued skilled therapy services to address deficits and promote return to optimal function.      OBJECTIVE IMPAIRMENTS: Abnormal gait, decreased activity tolerance, decreased balance, decreased coordination, decreased endurance, decreased mobility, difficulty walking, dizziness, impaired perceived functional ability, and pain.   ACTIVITY LIMITATIONS: carrying, lifting, bending, sitting, standing, squatting, sleeping, stairs, transfers, bed mobility, reach over head, hygiene/grooming, locomotion level, and caring for others  PARTICIPATION LIMITATIONS: meal prep, cleaning, laundry, shopping, and community activity  REHAB POTENTIAL: Good  CLINICAL DECISION MAKING: Evolving/moderate complexity  EVALUATION COMPLEXITY: Moderate   PLAN:  PT FREQUENCY: 2x/week  PT DURATION: 4 weeks  PLANNED INTERVENTIONS: Therapeutic exercises, Therapeutic activity, Neuromuscular re-education, Balance training, Gait training, Patient/Family education, Joint manipulation, Joint mobilization, Stair training, Orthotic/Fit training, DME instructions, Aquatic Therapy, Dry Needling, Electrical stimulation, Spinal manipulation, Spinal mobilization, Cryotherapy, Moist heat, Compression bandaging, scar mobilization, Splintting, Taping, Traction, Ultrasound, Ionotophoresis 4mg /ml Dexamethasone, and  Manual therapy   PLAN FOR NEXT SESSION:  discharge  10:58 AM, 10/18/22 Mercedes Valeriano Small Yaniyah Koors MPT [facility] [place] [phone] Ph:[phone]

## 2022-10-23 ENCOUNTER — Encounter (HOSPITAL_COMMUNITY): Payer: Medicare Other

## 2022-10-23 DIAGNOSIS — J3081 Allergic rhinitis due to animal (cat) (dog) hair and dander: Secondary | ICD-10-CM | POA: Diagnosis not present

## 2022-10-24 DIAGNOSIS — J3089 Other allergic rhinitis: Secondary | ICD-10-CM | POA: Diagnosis not present

## 2022-10-25 ENCOUNTER — Encounter (HOSPITAL_COMMUNITY): Payer: Medicare Other

## 2022-10-25 ENCOUNTER — Ambulatory Visit (INDEPENDENT_AMBULATORY_CARE_PROVIDER_SITE_OTHER): Payer: Medicare Other

## 2022-10-25 DIAGNOSIS — J309 Allergic rhinitis, unspecified: Secondary | ICD-10-CM

## 2022-10-25 NOTE — Progress Notes (Unsigned)
NEUROLOGY FOLLOW UP OFFICE NOTE  Jodi Ray [id number]  Assessment/Plan:   1  Right sided benign paroxysmal positional vertigo with residual dysequilibrium, may require some more time to resolve 2  Episodic cluster headache 3  Obstructive sleep apnea    Monitor dizziness.  Hopefully will continue to improve.  If not, consider starting SSRI Cluster Rescue:  100% O2 15L/min for 15-20 min If cluster headaches increase frequency, consider verapamil Limit use of pain relievers to no more than 2 days out of week to prevent risk of rebound or medication-overuse headache. Keep headache diary Continue CPAP Follow up 5-6 months     Subjective:  Jodi Ray is a 62 year old handed woman with chronic pain related to complex regional pain syndrome (with spinal stimulator), CAD, CKD, hypertension, type 1 diabetes mellitus with polyneuropathy, OSA on CPAP, hypothyroidism, polymyalgia rheumatica who follows up for cluster headache and dizziness.     UPDATE: Further workup for dizziness.  CTA Head and Neck on 08/29/2022 personally reviewed atherosclerosis of the carotid arteries but no LVO or hemodynamically significant stenosis.  I had ordered VNG performed in February which revealed right vestibular hypoperfusion  She has been going to physical therapy which has helped.  She is "50% better".  Dizziness has changed.  Not really spinning anymore.  Now it is a "wavy" sensation.  Needs to use the rollator outside the house to maintain balance.  Cannot take meclizine due to her kidney disease.    She reports she has been having more cluster headaches.  Whenever the weather changes, it triggers them.  Quickly aborts within 15 minutes with O2.    Current NSAIDs:  ASA 81mg  daily. Current analgesics:  none Current antihypertensive:  Coreg Current anti-epileptic:  none Current anti-CGRP: none Supplements:  Melatonin 20mg ; CoQ10 100mg ; D3 Other therapy:  100% O2 Other medications:   Synthroid Using CPAP Exercises regularly   HISTORY: I  Primary Stabbing Headache: Onset:  2011.  She was previously treated in [place], where she was diagnosed with primary stabbing headache. Location:  Right frontal region Quality:  stabbing Intensity:  10/10 Aura:  no Prodrome:  no Associated symptoms:  Some nausea if severe. No autonomic symptoms. Duration:  10 to 60 minutes Frequency:  4 times a week Triggers/exacerbating factors:  change in weather. Relieving factors:  none Activity:  Cannot function when experiencing it.   II Cluster headache:  She began having new intractable headaches in late Decemeber 2019.  Her right eye gets bloodshot and right nare runs.  Right side of face gets red.  They stabbing headache starts above the right eye and radiates, constant for a month, fluctuating in intensity.  She went to the [facility] ED on 08/11/18.  CT of head and CTA of head and neck were performed and personally reviewed, showing no acute abnormality or aneurysm.  Sed rate from 08/22/18 was 24.  Past management:  Prednisone taper, headache cocktail               III  Migraines:  They are bi-frontal/maxillary and associated with slight nausea.  She has had this before when her allergies "act up."     IV  Diabetic neuropathy:  Secondary to Type 1 diabetes.   V  Complex regional pain syndrome:  following an accident where she fell down the steps and crushed her right leg.  She takes gabapentin, tramadol and has a spinal nerve stimulator.  She takes this for her painful diabetic neuropathy  as well.   VI  Right arm pain and numbness:  Since 2015, she has had episodes of right arm pain and numbness.  It only occurs when she is laying in bed, and occurs no matter what position.  Her entire arm goes "dead".  It is both numb, painful and unable to move it.  There is no shooting pain down the arm from the neck, however she gets occasional shooting pain from the right side of her neck into the  shoulder.  She has to use her other arm to shake it out and it resolves in a minute or two.  It occurs every night.  She was sent to pain management for possible cervical radiculopathy or thoracic outlet syndrome.  She did receive injections, which helped.  She had an MRI of the cervical and thoracic spine on 06/12/14.  Imaging not available, but report mentions diffuse facet arthropathy and degenerative changes in the cervical spine, but no nerve root impingement or cord compression.  There was no evidence of thoracic outlet syndrome.  She was sent to [facility] for further evaluation.  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  They told her that her symptoms were related to her diabetic neuropathy.   VII  Cerebral aneurysm:  In 2009, she had a CTA of the head which reportedly showed a 1-72mm an eurysm at the junction of right A1 and A2 segment of the ACA.  However, a repeat CTA performed on 02/22/12 did not reveal any aneurysm.     She cannot have an MRI due to spinal stimulator.   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 02/2018: s/p DES to Proximal LAD with residual 20% RCA stenosis.   Chronic pain    Diabetes mellitus without complication (HCC)    Headache    History of left bundle branch block (LBBB)    HTN (hypertension)    Hx of cardiovascular stress test 10/2016    intermediate risk study   Hypothyroidism    Neuromuscular disorder (HCC)    Neuropathy    Obesity    OSA on CPAP 03/10/2015   Renal insufficiency    Retinopathy    Sleep apnea    Varicose veins of both lower extremities     MEDICATIONS: Current Outpatient Medications on File Prior to Visit  Medication Sig Dispense Refill   ascorbic acid (VITAMIN C) 500 MG tablet Take by mouth.     aspirin EC 81 MG tablet Take 1 tablet (81 mg total) by mouth daily. Swallow whole. 90 tablet 3   atorvastatin (LIPITOR) 80 MG tablet TAKE 1 TABLET BY MOUTH ONCE DAILY AT  6PM 90 tablet 2   calcitRIOL (ROCALTROL) 0.25 MCG capsule Take 0.25 mcg by mouth every other day.     carvedilol (COREG) 3.125 MG tablet Take by mouth.     cholecalciferol (VITAMIN D3) 10 MCG (400 UNIT) TABS tablet Take by mouth.  clopidogrel (PLAVIX) 75 MG tablet Take by mouth.     Continuous Blood Gluc Sensor (DEXCOM G6 SENSOR) MISC 1 Device by Other route as directed.     EPINEPHrine 0.3 mg/0.3 mL IJ SOAJ injection Inject 0.3 mg into the muscle as needed for anaphylaxis. 2 each 1   fish oil-omega-3 fatty acids 1000 MG capsule Take 1 g by mouth in the morning and at bedtime.     furosemide (LASIX) 20 MG tablet Take 20 mg by mouth. Take 2 tablets in the morning and 1 tablet in the evening     furosemide (LASIX) 40 MG tablet Take 20-40 mg by mouth 2 (two) times daily. 40 MG AM, 20 MG PM     Galcanezumab-gnlm (EMGALITY) 120 MG/ML SOAJ 120 mg every 30 (thirty) days.     Glucagon (GVOKE HYPOPEN 2-PACK) 1 MG/0.2ML SOAJ      glucose blood ([employer] BLOOD GLUCOSE TEST) test strip      hydrochlorothiazide (MICROZIDE) 12.5 MG capsule Take by mouth.     Insulin Human (INSULIN PUMP) SOLN Inject into the skin continuous.      Insulin Pen Needle (PEN NEEDLES 31GX5/16") 31G X 8 MM MISC as directed subcutaneous Twice a day for 30 days     JARDIANCE 10 MG TABS tablet Take 5 mg by mouth every other day.     KERENDIA 10 MG TABS Take 1 tablet by mouth  daily.     levothyroxine (SYNTHROID) 112 MCG tablet Take 112 mcg by mouth daily before breakfast.     lisinopril (ZESTRIL) 10 MG tablet Take by mouth.     loratadine (CLARITIN) 10 MG tablet Take 2 tablets (20 mg total) by mouth daily. 180 tablet 3   meclizine (ANTIVERT) 25 MG tablet Take 25 mg by mouth every 6 (six) hours as needed.     metFORMIN (GLUCOPHAGE) 500 MG tablet Take 1 tablet (500 mg total) by mouth 2 (two) times daily with a meal.     nitroGLYCERIN (NITROSTAT) 0.4 MG SL tablet Place 1 tablet (0.4 mg total) under the tongue every 5 (five) minutes as needed for chest pain. 25 tablet 3   NOVOLOG 100 UNIT/ML injection Inject into the skin continuous. In insulin pump  0   pantoprazole (PROTONIX) 40 MG tablet Take 1 tablet (40 mg total) by mouth daily. 90 tablet 3   triamcinolone ointment (KENALOG) 0.1 % Apply 1 Application topically 2 (two) times daily. 30 g 0   No current facility-administered medications on file prior to visit.    ALLERGIES: Allergies  Allergen Reactions   Other Nausea And Vomiting and Other (See Comments)    Opiates Alters mental status diarrhea   Fentanyl Nausea And Vomiting and Other (See Comments)    Nausea, vomiting, loss of consciousness, requiring reversal    FAMILY HISTORY: Family History  Problem Relation Age of Onset   Cancer Mother 72       colon   Cancer Father        renal cell   Colon cancer Father    Breast cancer Sister    Cancer Sister        primary brain, and primary breast too   Breast cancer Sister    Cancer Sister        breast    Breast cancer Sister    Breast cancer Sister    Cancer Brother        kidney and liver   Sleep apnea Neg Hx  Objective:  Blood pressure (!) 153/84, pulse 82, height 5\' 8"  (1.727 m), weight 245 lb (111.1 kg), SpO2 96 %. General: No acute distress.  Patient appears well-groomed.   Head:  Normocephalic/atraumatic Eyes:  Fundi examined but not visualized Neck: supple, no paraspinal  tenderness, full range of motion Heart:  Regular rate and rhythm Neurological Exam: alert and oriented to person, place, and time.  Speech fluent and not dysarthric, language intact.  CN II-XII intact. Bulk and tone normal, muscle strength 5/5 throughout.  Sensation to light touch intact.  Deep tendon reflexes absent throughout.  Finger to nose testing intact.  Broad-based cautious gait, Romberg with mild sway.   Jodi Clines, DO  CC: Monico Blitz, MD

## 2022-10-26 ENCOUNTER — Ambulatory Visit (INDEPENDENT_AMBULATORY_CARE_PROVIDER_SITE_OTHER): Payer: Medicare Other | Admitting: Neurology

## 2022-10-26 ENCOUNTER — Encounter: Payer: Self-pay | Admitting: Neurology

## 2022-10-26 VITALS — BP 153/84 | HR 82 | Ht 68.0 in | Wt 245.0 lb

## 2022-10-26 DIAGNOSIS — R42 Dizziness and giddiness: Secondary | ICD-10-CM | POA: Diagnosis not present

## 2022-10-26 DIAGNOSIS — G44019 Episodic cluster headache, not intractable: Secondary | ICD-10-CM

## 2022-10-26 NOTE — Patient Instructions (Signed)
Continue to monitor for further resolution of dizziness.  Continue exercisees Monitor cluster headaches.  Use oxygen as needed

## 2022-10-27 ENCOUNTER — Encounter (INDEPENDENT_AMBULATORY_CARE_PROVIDER_SITE_OTHER): Payer: Medicare Other | Admitting: Ophthalmology

## 2022-10-31 DIAGNOSIS — E109 Type 1 diabetes mellitus without complications: Secondary | ICD-10-CM | POA: Diagnosis not present

## 2022-10-31 DIAGNOSIS — E669 Obesity, unspecified: Secondary | ICD-10-CM | POA: Diagnosis not present

## 2022-10-31 DIAGNOSIS — E1140 Type 2 diabetes mellitus with diabetic neuropathy, unspecified: Secondary | ICD-10-CM | POA: Diagnosis not present

## 2022-10-31 DIAGNOSIS — E039 Hypothyroidism, unspecified: Secondary | ICD-10-CM | POA: Diagnosis not present

## 2022-10-31 DIAGNOSIS — I10 Essential (primary) hypertension: Secondary | ICD-10-CM | POA: Diagnosis not present

## 2022-10-31 DIAGNOSIS — E785 Hyperlipidemia, unspecified: Secondary | ICD-10-CM | POA: Diagnosis not present

## 2022-10-31 DIAGNOSIS — Z9641 Presence of insulin pump (external) (internal): Secondary | ICD-10-CM | POA: Diagnosis not present

## 2022-11-01 ENCOUNTER — Ambulatory Visit (INDEPENDENT_AMBULATORY_CARE_PROVIDER_SITE_OTHER): Payer: Medicare Other

## 2022-11-01 DIAGNOSIS — J309 Allergic rhinitis, unspecified: Secondary | ICD-10-CM | POA: Diagnosis not present

## 2022-11-01 NOTE — Progress Notes (Signed)
[facility] - Clinic Note  11/03/2022     CHIEF COMPLAINT Patient presents for Retina Follow Up   HISTORY OF PRESENT ILLNESS: Jodi Ray is a 62 y.o. female who presents to the clinic today for:   HPI     Retina Follow Up   Patient presents with  Diabetic Retinopathy.  In both eyes.  This started months ago.  Duration of 4 weeks.  Since onset it is stable.  I, the attending physician,  performed the HPI with the patient and updated documentation appropriately.        Comments   Patient feels that the vision is improving. She is not using any eye drops at this time. Her blood sugar was 102.      Last edited by Rennis Chris, MD on 11/03/2022 11:18 AM.    Pt states her eyes are getting better   Referring physician: Kirstie Peri, MD [address] [place],  [place] [address]  HISTORICAL INFORMATION:   Selected notes from the MEDICAL RECORD NUMBER Referred by Dr. [name] for concern of DME LEE:  Ocular Hx- PMH-    CURRENT MEDICATIONS: No current outpatient medications on file. (Ophthalmic Drugs)   No current facility-administered medications for this visit. (Ophthalmic Drugs)   Current Outpatient Medications (Other)  Medication Sig   ascorbic acid (VITAMIN C) 500 MG tablet Take by mouth.   aspirin EC 81 MG tablet Take 1 tablet (81 mg total) by mouth daily. Swallow whole.   atorvastatin (LIPITOR) 80 MG tablet TAKE 1 TABLET BY MOUTH ONCE DAILY AT  6PM   calcitRIOL (ROCALTROL) 0.25 MCG capsule Take 0.25 mcg by mouth every other day.   carvedilol (COREG) 3.125 MG tablet Take by mouth.   cholecalciferol (VITAMIN D3) 10 MCG (400 UNIT) TABS tablet Take by mouth.   clopidogrel (PLAVIX) 75 MG tablet Take by mouth.   Continuous Blood Gluc Sensor (DEXCOM G6 SENSOR) MISC 1 Device by Other route as directed.   EPINEPHrine 0.3 mg/0.3 mL IJ SOAJ injection Inject 0.3 mg into the muscle as needed for anaphylaxis.   fish oil-omega-3 fatty acids 1000 MG capsule Take  1 g by mouth in the morning and at bedtime.   furosemide (LASIX) 20 MG tablet Take 20 mg by mouth. Take 2 tablets in the morning and 1 tablet in the evening   furosemide (LASIX) 40 MG tablet Take 20-40 mg by mouth 2 (two) times daily. 40 MG AM, 20 MG PM   Galcanezumab-gnlm (EMGALITY) 120 MG/ML SOAJ 120 mg every 30 (thirty) days.   Glucagon (GVOKE HYPOPEN 2-PACK) 1 MG/0.2ML SOAJ    glucose blood ([employer] BLOOD GLUCOSE TEST) test strip    hydrochlorothiazide (MICROZIDE) 12.5 MG capsule Take by mouth.   Insulin Human (INSULIN PUMP) SOLN Inject into the skin continuous.    Insulin Pen Needle (PEN NEEDLES 31GX5/16") 31G X 8 MM MISC as directed subcutaneous Twice a day for 30 days   JARDIANCE 10 MG TABS tablet Take 5 mg by mouth every other day.   KERENDIA 10 MG TABS Take 1 tablet by mouth daily.   levothyroxine (SYNTHROID) 112 MCG tablet Take 112 mcg by mouth daily before breakfast.   lisinopril (ZESTRIL) 10 MG tablet Take by mouth.   loratadine (CLARITIN) 10 MG tablet Take 2 tablets (20 mg total) by mouth daily.   meclizine (ANTIVERT) 25 MG tablet Take 25 mg by mouth every 6 (six) hours as needed.   metFORMIN (GLUCOPHAGE) 500 MG tablet Take 1  tablet (500 mg total) by mouth 2 (two) times daily with a meal.   nitroGLYCERIN (NITROSTAT) 0.4 MG SL tablet Place 1 tablet (0.4 mg total) under the tongue every 5 (five) minutes as needed for chest pain.   NOVOLOG 100 UNIT/ML injection Inject into the skin continuous. In insulin pump   pantoprazole (PROTONIX) 40 MG tablet Take 1 tablet (40 mg total) by mouth daily.   triamcinolone ointment (KENALOG) 0.1 % Apply 1 Application topically 2 (two) times daily.   No current facility-administered medications for this visit. (Other)   REVIEW OF SYSTEMS: ROS   Positive for: Endocrine, Cardiovascular, Eyes Negative for: Constitutional, Gastrointestinal, Neurological, Skin, Genitourinary, Musculoskeletal, HENT, Respiratory, Psychiatric, Allergic/Imm, Heme/Lymph Last  edited by Julieanne Cotton, COT on 11/03/2022  9:08 AM.     ALLERGIES Allergies  Allergen Reactions   Other Nausea And Vomiting and Other (See Comments)    Opiates Alters mental status diarrhea   Fentanyl Nausea And Vomiting and Other (See Comments)    Nausea, vomiting, loss of consciousness, requiring reversal   PAST MEDICAL HISTORY Past Medical History:  Diagnosis Date   Adult RDS    Anemia    Brain aneurysm    frontal lobe   CAD (coronary artery disease)    a. 02/2018: s/p DES to Proximal LAD with residual 20% RCA stenosis.   Chronic pain    Diabetes mellitus without complication    Headache    History of left bundle branch block (LBBB)    HTN (hypertension)    Hx of cardiovascular stress test 10/2016   intermediate risk study   Hypothyroidism    Neuromuscular disorder    Neuropathy    Obesity    OSA on CPAP 03/10/2015   Renal insufficiency    Retinopathy    Sleep apnea    Varicose veins of both lower extremities    Past Surgical History:  Procedure Laterality Date   BIOPSY  11/14/2016   Procedure: BIOPSY;  Surgeon: West Bali, MD;  Location: [facility];  Service: Endoscopy;;  colon gastric duodenal   BREAST EXCISIONAL BIOPSY Right    BREAST SURGERY Left 1994   Lumpectomy   COLONOSCOPY WITH PROPOFOL N/A 11/14/2016   Dr. Darrick Penna: Moderately redundant rectosigmoid colon. Random colon biopsies benign. Internal hemorrhoids. Surveillance colonoscopy in 5 years.   COLONOSCOPY WITH PROPOFOL N/A 04/20/2022   Procedure: COLONOSCOPY WITH PROPOFOL;  Surgeon: Lanelle Bal, DO;  Location: [facility];  Service: Endoscopy;  Laterality: N/A;  12:30am   CORONARY STENT INTERVENTION N/A 03/22/2018   Procedure: CORONARY STENT INTERVENTION;  Surgeon: Kathleene Hazel, MD;  Location: [facility];  Service: Cardiovascular;  Laterality: N/A;   ESOPHAGOGASTRODUODENOSCOPY (EGD) WITH PROPOFOL N/A 11/14/2016   Dr. Darrick Penna: Esophagus normal. Moderate  gastritis, few gastric polyps. Duodenal biopsies negative. Fundic gland polyp gastric polyp area no H pylori.   FOOT SURGERY     LEFT HEART CATH AND CORONARY ANGIOGRAPHY N/A 03/22/2018   Procedure: LEFT HEART CATH AND CORONARY ANGIOGRAPHY;  Surgeon: Kathleene Hazel, MD;  Location: [facility];  Service: Cardiovascular;  Laterality: N/A;   LEG SURGERY Right    POLYPECTOMY  04/20/2022   Procedure: POLYPECTOMY;  Surgeon: Lanelle Bal, DO;  Location: [facility];  Service: Endoscopy;;   REPLACEMENT TOTAL KNEE Right    SPINAL CORD STIMULATOR IMPLANT     FAMILY HISTORY Family History  Problem Relation Age of Onset   Cancer Mother 58  colon   Cancer Father        renal cell   Colon cancer Father    Breast cancer Sister    Cancer Sister        primary brain, and primary breast too   Breast cancer Sister    Cancer Sister        breast    Breast cancer Sister    Breast cancer Sister    Cancer Brother        kidney and liver   Sleep apnea Neg Hx    SOCIAL HISTORY Social History   Tobacco Use   Smoking status: Never   Smokeless tobacco: Never  Vaping Use   Vaping Use: Never used  Substance Use Topics   Alcohol use: No    Alcohol/week: 0.0 standard drinks of alcohol   Drug use: No       OPHTHALMIC EXAM:  Base Eye Exam     Visual Acuity (Snellen - Linear)       Right Left   Dist cc 20/25 +1 20/20    Correction: Glasses         Tonometry (Tonopen, 9:11 AM)       Right Left   Pressure 17 19         Pupils       Dark Light Shape React APD   Right 3 2 Round Brisk None   Left 3 2 Round Brisk None         Visual Fields       Left Right    Full Full         Extraocular Movement       Right Left    Full Full         Neuro/Psych     Oriented x3: Yes   Mood/Affect: Normal         Dilation     Both eyes: 1.0% Mydriacyl, 2.5% Phenylephrine @ 9:08 AM           Slit Lamp and Fundus Exam     Slit Lamp Exam        Right Left   Lids/Lashes Dermatochalasis - upper lid, mild MGD Dermatochalasis - upper lid, mild MGD   Conjunctiva/Sclera nasal pingeucula nasal pingeucula, Trace Injection   Cornea arcus, mild tear film debris arcus, mild tear film debris   Anterior Chamber deep and clear, no cell or flare deep and clear, no cell or flare   Iris Round and dilated, No NVI Round and dilated, No NVI   Lens 2+ Nuclear sclerosis, 2-3+ Cortical cataract, 1+ Posterior subcapsular cataract 2+ Nuclear sclerosis, 2-3+ Cortical cataract, 1+ Posterior subcapsular cataract   Anterior Vitreous Vitreous syneresis Vitreous syneresis         Fundus Exam       Right Left   Disc Pink and Sharp, no NVD, temporal PPA Pink and Sharp, no NVD, temporal PPP   C/D Ratio 0.4 0.4   Macula Blunted foveal reflex, +cystic changes - persistent, scattered MA -- improving Flat, good foveal reflex, scattered MA -- improved, trace cystic changes   Vessels attenuated, Tortuous attenuated, Tortuous, +NV   Periphery Attached, dense 360 PRP extending posteriorly to arcades Attached, 360 PRP with good posterior fill in, room for fill in if needed           Refraction     Wearing Rx       Sphere Cylinder Axis Add   Right +1.75 +1.50 124 +2.50  Left +4.25 +2.25 001 +2.50           IMAGING AND PROCEDURES  Imaging and Procedures for 11/03/2022  OCT, Retina - OU - Both Eyes       Right Eye Quality was good. Central Foveal Thickness: 338. Progression has improved. Findings include normal foveal contour, no SRF, intraretinal fluid (persistent cystic changes temporal macula and centrally -- slightly improved).   Left Eye Quality was good. Central Foveal Thickness: 301. Progression has improved. Findings include normal foveal contour, no SRF, intraretinal fluid (Interval improvement in cystic changes temporal macula -- just trace cystic changes remain).   Notes *Images captured and stored on drive  Diagnosis / Impression:   +DME OU OD: persistent cystic changes temporal macula and centrally -- slightly improved OS: Interval improvement in cystic changes temporal macula -- just trace cystic changes remain  Clinical management:  See below  Abbreviations: NFP - Normal foveal profile. CME - cystoid macular edema. PED - pigment epithelial detachment. IRF - intraretinal fluid. SRF - subretinal fluid. EZ - ellipsoid zone. ERM - epiretinal membrane. ORA - outer retinal atrophy. ORT - outer retinal tubulation. SRHM - subretinal hyper-reflective material. IRHM - intraretinal hyper-reflective material      Intravitreal Injection, Pharmacologic Agent - OD - Right Eye       Time Out 11/03/2022. 9:43 AM. Confirmed correct patient, procedure, site, and patient consented.   Anesthesia Topical anesthesia was used. Anesthetic medications included Lidocaine 2%, Proparacaine 0.5%.   Procedure Preparation included 5% betadine to ocular surface, eyelid speculum. A supplied needle was used.   Injection: 1.25 mg Bevacizumab 1.25mg /0.13ml   Route: Intravitreal, Site: Right Eye   NDC: P3213405, Lot: [id number], Expiration date: 12/16/2022   Post-op Post injection exam found visual acuity of at least counting fingers. The patient tolerated the procedure well. There were no complications. The patient received written and verbal post procedure care education.      Intravitreal Injection, Pharmacologic Agent - OS - Left Eye       Time Out 11/03/2022. 9:44 AM. Confirmed correct patient, procedure, site, and patient consented.   Anesthesia Topical anesthesia was used. Anesthetic medications included Lidocaine 4%, Proparacaine 0.5%.   Procedure Preparation included 5% betadine to ocular surface, eyelid speculum. A (32g) needle was used.   Injection: 1.25 mg Bevacizumab 1.25mg /0.76ml   Route: Intravitreal, Site: Left Eye   NDC: P3213405, Lot: [id number], Expiration date: 02/01/2023   Post-op Post injection exam  found visual acuity of at least counting fingers. The patient tolerated the procedure well. There were no complications. The patient received written and verbal post procedure care education. Post injection medications were not given.             ASSESSMENT/PLAN:    ICD-10-CM   1. Proliferative diabetic retinopathy of both eyes with macular edema associated with type 2 diabetes mellitus  E11.3513 OCT, Retina - OU - Both Eyes    Intravitreal Injection, Pharmacologic Agent - OD - Right Eye    Intravitreal Injection, Pharmacologic Agent - OS - Left Eye    Bevacizumab (AVASTIN) SOLN 1.25 mg    Bevacizumab (AVASTIN) SOLN 1.25 mg    2. Essential hypertension  I10     3. Hypertensive retinopathy of both eyes  H35.033     4. Combined forms of age-related cataract of both eyes  H25.813      Proliferative diabetic retinopathy OU *delayed f/u - 5 wks instead of 4 - previously managed by Dr. Cathey Endow -- lost to  f/u since 2020 - s/p PRP OU and IVA OS x7 (BB) - s/p IVA OD #1 (01.02.24) #2 (02.02.24) #3 (03.01.24) - s/p IVA OS #1 (01.04.24) #2 (02.02.24) #3 (03.01.24) - s/p PRP fill in OS (01.23.24) - exam shows scattered MA OU; laser PRP OU - FA (01.02.23) shows OD: Cluster of leakage temporal macula, no NV; OS: Scattered focal NVE greatest inferior and temporal quads -- will need PRP fill in OS - OCT shows OD: persistent cystic changes temporal macula and centrally -- slightly improved; OS: Interval improvement in cystic changes temporal macula -- just trace cystic changes remain 5 wks - recommend IVA OU #4 today, 04.05.24 w/ f/u back to 4 wks - pt wishes to proceed with injections - RBA of procedure discussed, questions answered - see procedure note - IVA informed consent obtained and signed, 01.02.24 (OU) - f/u 4 weeks -- DFE, OCT  2,3. Hypertensive retinopathy OU - discussed importance of tight BP control - monitor  4. Mixed Cataract OU - The symptoms of cataract, surgical options,  and treatments and risks were discussed with patient. - discussed diagnosis and progression - monitor  Ophthalmic Meds Ordered this visit:  Meds ordered this encounter  Medications   Bevacizumab (AVASTIN) SOLN 1.25 mg   Bevacizumab (AVASTIN) SOLN 1.25 mg     Return in about 4 weeks (around 12/01/2022) for f/u PDR OU, DFE, OCT.  There are no Patient Instructions on file for this visit.  This document serves as a record of services personally performed by Karie Chimera, MD, PhD. It was created on their behalf by Berlin Hun COT, an ophthalmic technician. The creation of this record is the provider's dictation and/or activities during the visit.    Electronically signed by: Berlin Hun COT 11/01/2022 11:20 AM  This document serves as a record of services personally performed by Karie Chimera, MD, PhD. It was created on their behalf by Glee Arvin. Manson Passey, OA an ophthalmic technician. The creation of this record is the provider's dictation and/or activities during the visit.    Electronically signed by: Glee Arvin. Manson Passey, [place] 04.05.2024 11:20 AM  Karie Chimera, M.D., Ph.D. Diseases & Surgery of the Retina and Vitreous [name] 11/03/2022   I have reviewed the above documentation for accuracy and completeness, and I agree with the above. Karie Chimera, M.D., Ph.D. 11/03/22 11:21 AM  Abbreviations: M myopia (nearsighted); A astigmatism; H hyperopia (farsighted); P presbyopia; Mrx spectacle prescription;  CTL contact lenses; OD right eye; OS left eye; OU both eyes  XT exotropia; ET esotropia; PEK punctate epithelial keratitis; PEE punctate epithelial erosions; DES dry eye syndrome; MGD meibomian gland dysfunction; ATs artificial tears; PFAT's preservative free artificial tears; NSC nuclear sclerotic cataract; PSC posterior subcapsular cataract; ERM epi-retinal membrane; PVD posterior vitreous detachment; RD retinal detachment; DM diabetes mellitus; DR  diabetic retinopathy; NPDR non-proliferative diabetic retinopathy; PDR proliferative diabetic retinopathy; CSME clinically significant macular edema; DME diabetic macular edema; dbh dot blot hemorrhages; CWS cotton wool spot; POAG primary open angle glaucoma; C/D cup-to-disc ratio; HVF humphrey visual field; GVF goldmann visual field; OCT optical coherence tomography; IOP intraocular pressure; BRVO Branch retinal vein occlusion; CRVO central retinal vein occlusion; CRAO central retinal artery occlusion; BRAO branch retinal artery occlusion; RT retinal tear; SB scleral buckle; PPV pars plana vitrectomy; VH Vitreous hemorrhage; PRP panretinal laser photocoagulation; IVK intravitreal kenalog; VMT vitreomacular traction; MH Macular hole;  NVD neovascularization of the disc; NVE neovascularization elsewhere; AREDS age related eye  disease study; ARMD age related macular degeneration; POAG primary open angle glaucoma; EBMD epithelial/anterior basement membrane dystrophy; ACIOL anterior chamber intraocular lens; IOL intraocular lens; PCIOL posterior chamber intraocular lens; Phaco/IOL phacoemulsification with intraocular lens placement; [place] photorefractive keratectomy; LASIK laser assisted in situ keratomileusis; HTN hypertension; DM diabetes mellitus; COPD chronic obstructive pulmonary disease

## 2022-11-03 ENCOUNTER — Ambulatory Visit (INDEPENDENT_AMBULATORY_CARE_PROVIDER_SITE_OTHER): Payer: Medicare Other | Admitting: Ophthalmology

## 2022-11-03 ENCOUNTER — Encounter (INDEPENDENT_AMBULATORY_CARE_PROVIDER_SITE_OTHER): Payer: Self-pay | Admitting: Ophthalmology

## 2022-11-03 DIAGNOSIS — H25813 Combined forms of age-related cataract, bilateral: Secondary | ICD-10-CM

## 2022-11-03 DIAGNOSIS — I10 Essential (primary) hypertension: Secondary | ICD-10-CM

## 2022-11-03 DIAGNOSIS — E113513 Type 2 diabetes mellitus with proliferative diabetic retinopathy with macular edema, bilateral: Secondary | ICD-10-CM

## 2022-11-03 DIAGNOSIS — H35033 Hypertensive retinopathy, bilateral: Secondary | ICD-10-CM

## 2022-11-03 MED ORDER — BEVACIZUMAB CHEMO INJECTION 1.25MG/0.05ML SYRINGE FOR KALEIDOSCOPE
1.2500 mg | INTRAVITREAL | Status: AC | PRN
  Administered 2022-11-03: 1.25 mg via INTRAVITREAL

## 2022-11-08 ENCOUNTER — Ambulatory Visit (INDEPENDENT_AMBULATORY_CARE_PROVIDER_SITE_OTHER): Payer: Medicare Other

## 2022-11-08 DIAGNOSIS — J309 Allergic rhinitis, unspecified: Secondary | ICD-10-CM

## 2022-11-10 DIAGNOSIS — H6123 Impacted cerumen, bilateral: Secondary | ICD-10-CM | POA: Diagnosis not present

## 2022-11-16 ENCOUNTER — Encounter: Payer: Self-pay | Admitting: Neurology

## 2022-11-17 ENCOUNTER — Encounter: Payer: Self-pay | Admitting: Neurology

## 2022-11-17 ENCOUNTER — Ambulatory Visit (INDEPENDENT_AMBULATORY_CARE_PROVIDER_SITE_OTHER): Payer: Medicare Other

## 2022-11-17 DIAGNOSIS — J309 Allergic rhinitis, unspecified: Secondary | ICD-10-CM | POA: Diagnosis not present

## 2022-11-21 DIAGNOSIS — E1151 Type 2 diabetes mellitus with diabetic peripheral angiopathy without gangrene: Secondary | ICD-10-CM | POA: Diagnosis not present

## 2022-11-21 DIAGNOSIS — E1140 Type 2 diabetes mellitus with diabetic neuropathy, unspecified: Secondary | ICD-10-CM | POA: Diagnosis not present

## 2022-11-24 ENCOUNTER — Ambulatory Visit (INDEPENDENT_AMBULATORY_CARE_PROVIDER_SITE_OTHER): Payer: Medicare Other

## 2022-11-24 ENCOUNTER — Other Ambulatory Visit: Payer: Self-pay | Admitting: Gastroenterology

## 2022-11-24 DIAGNOSIS — J309 Allergic rhinitis, unspecified: Secondary | ICD-10-CM | POA: Diagnosis not present

## 2022-11-27 DIAGNOSIS — N2581 Secondary hyperparathyroidism of renal origin: Secondary | ICD-10-CM | POA: Diagnosis not present

## 2022-11-27 DIAGNOSIS — Z299 Encounter for prophylactic measures, unspecified: Secondary | ICD-10-CM | POA: Diagnosis not present

## 2022-11-27 DIAGNOSIS — N6310 Unspecified lump in the right breast, unspecified quadrant: Secondary | ICD-10-CM | POA: Diagnosis not present

## 2022-11-27 DIAGNOSIS — N61 Mastitis without abscess: Secondary | ICD-10-CM | POA: Diagnosis not present

## 2022-11-27 DIAGNOSIS — N183 Chronic kidney disease, stage 3 unspecified: Secondary | ICD-10-CM | POA: Diagnosis not present

## 2022-11-29 ENCOUNTER — Ambulatory Visit (INDEPENDENT_AMBULATORY_CARE_PROVIDER_SITE_OTHER): Payer: Medicare Other

## 2022-11-29 DIAGNOSIS — J309 Allergic rhinitis, unspecified: Secondary | ICD-10-CM

## 2022-11-29 NOTE — Progress Notes (Signed)
[facility] - Clinic Note  12/01/2022     CHIEF COMPLAINT Patient presents for Retina Follow Up   HISTORY OF PRESENT ILLNESS: Jodi Ray is a 62 y.o. female who presents to the clinic today for:   HPI     Retina Follow Up   Patient presents with  Diabetic Retinopathy.  In both eyes.  This started 4 weeks ago.  Duration of 4 weeks.  Since onset it is stable.  I, the attending physician,  performed the HPI with the patient and updated documentation appropriately.        Comments   4 week retina follow up PDR ou and IVA OU pt is reporting no vision changes noticed pt denies any flashes or floaters her last blood sugar was 109      Last edited by Rennis Chris, MD on 12/01/2022  8:34 AM.    Pt states her vision is good, she had a hard time focusing when reading the eye chart this morning, her blood sugar was 109 when she woke up, currently it is 136, her A1c is 5.7   Referring physician: Kirstie Peri, MD [address] [place],  [place] [address]  HISTORICAL INFORMATION:   Selected notes from the MEDICAL RECORD NUMBER Referred by Dr. [name] for concern of DME LEE:  Ocular Hx- PMH-    CURRENT MEDICATIONS: No current outpatient medications on file. (Ophthalmic Drugs)   No current facility-administered medications for this visit. (Ophthalmic Drugs)   Current Outpatient Medications (Other)  Medication Sig   ascorbic acid (VITAMIN C) 500 MG tablet Take by mouth.   aspirin EC 81 MG tablet Take 1 tablet (81 mg total) by mouth daily. Swallow whole.   atorvastatin (LIPITOR) 80 MG tablet TAKE 1 TABLET BY MOUTH ONCE DAILY AT  6PM   calcitRIOL (ROCALTROL) 0.25 MCG capsule Take 0.25 mcg by mouth every other day.   carvedilol (COREG) 3.125 MG tablet Take by mouth.   cholecalciferol (VITAMIN D3) 10 MCG (400 UNIT) TABS tablet Take by mouth.   clopidogrel (PLAVIX) 75 MG tablet Take by mouth.   Continuous Blood Gluc Sensor (DEXCOM G6 SENSOR) MISC 1 Device by Other  route as directed.   EPINEPHrine 0.3 mg/0.3 mL IJ SOAJ injection Inject 0.3 mg into the muscle as needed for anaphylaxis.   fish oil-omega-3 fatty acids 1000 MG capsule Take 1 g by mouth in the morning and at bedtime.   furosemide (LASIX) 20 MG tablet Take 20 mg by mouth. Take 2 tablets in the morning and 1 tablet in the evening   furosemide (LASIX) 40 MG tablet Take 20-40 mg by mouth 2 (two) times daily. 40 MG AM, 20 MG PM   Galcanezumab-gnlm (EMGALITY) 120 MG/ML SOAJ 120 mg every 30 (thirty) days.   Glucagon (GVOKE HYPOPEN 2-PACK) 1 MG/0.2ML SOAJ    glucose blood ([employer] BLOOD GLUCOSE TEST) test strip    hydrochlorothiazide (MICROZIDE) 12.5 MG capsule Take by mouth.   Insulin Human (INSULIN PUMP) SOLN Inject into the skin continuous.    Insulin Pen Needle (PEN NEEDLES 31GX5/16") 31G X 8 MM MISC as directed subcutaneous Twice a day for 30 days   JARDIANCE 10 MG TABS tablet Take 5 mg by mouth every other day.   KERENDIA 10 MG TABS Take 1 tablet by mouth daily.   levothyroxine (SYNTHROID) 112 MCG tablet Take 112 mcg by mouth daily before breakfast.   lisinopril (ZESTRIL) 10 MG tablet Take by mouth.   loratadine (CLARITIN) 10  MG tablet Take 2 tablets (20 mg total) by mouth daily.   meclizine (ANTIVERT) 25 MG tablet Take 25 mg by mouth every 6 (six) hours as needed.   metFORMIN (GLUCOPHAGE) 500 MG tablet Take 1 tablet (500 mg total) by mouth 2 (two) times daily with a meal.   nitroGLYCERIN (NITROSTAT) 0.4 MG SL tablet Place 1 tablet (0.4 mg total) under the tongue every 5 (five) minutes as needed for chest pain.   NOVOLOG 100 UNIT/ML injection Inject into the skin continuous. In insulin pump   pantoprazole (PROTONIX) 40 MG tablet Take 1 tablet by mouth once daily   triamcinolone ointment (KENALOG) 0.1 % Apply 1 Application topically 2 (two) times daily.   No current facility-administered medications for this visit. (Other)   REVIEW OF SYSTEMS: ROS   Positive for: Endocrine, Cardiovascular,  Eyes Negative for: Constitutional, Gastrointestinal, Neurological, Skin, Genitourinary, Musculoskeletal, HENT, Respiratory, Psychiatric, Allergic/Imm, Heme/Lymph Last edited by Etheleen Mayhew, COT on 12/01/2022  7:47 AM.      ALLERGIES Allergies  Allergen Reactions   Other Nausea And Vomiting and Other (See Comments)    Opiates Alters mental status diarrhea   Fentanyl Nausea And Vomiting and Other (See Comments)    Nausea, vomiting, loss of consciousness, requiring reversal   PAST MEDICAL HISTORY Past Medical History:  Diagnosis Date   Adult RDS (HCC)    Anemia    Brain aneurysm    frontal lobe   CAD (coronary artery disease)    a. 02/2018: s/p DES to Proximal LAD with residual 20% RCA stenosis.   Chronic pain    Diabetes mellitus without complication (HCC)    Headache    History of left bundle branch block (LBBB)    HTN (hypertension)    Hx of cardiovascular stress test 10/2016   intermediate risk study   Hypothyroidism    Neuromuscular disorder (HCC)    Neuropathy    Obesity    OSA on CPAP 03/10/2015   Renal insufficiency    Retinopathy    Sleep apnea    Varicose veins of both lower extremities    Past Surgical History:  Procedure Laterality Date   BIOPSY  11/14/2016   Procedure: BIOPSY;  Surgeon: West Bali, MD;  Location: [facility];  Service: Endoscopy;;  colon gastric duodenal   BREAST EXCISIONAL BIOPSY Right    BREAST SURGERY Left 1994   Lumpectomy   COLONOSCOPY WITH PROPOFOL N/A 11/14/2016   Dr. Darrick Penna: Moderately redundant rectosigmoid colon. Random colon biopsies benign. Internal hemorrhoids. Surveillance colonoscopy in 5 years.   COLONOSCOPY WITH PROPOFOL N/A 04/20/2022   Procedure: COLONOSCOPY WITH PROPOFOL;  Surgeon: Lanelle Bal, DO;  Location: [facility];  Service: Endoscopy;  Laterality: N/A;  12:30am   CORONARY STENT INTERVENTION N/A 03/22/2018   Procedure: CORONARY STENT INTERVENTION;  Surgeon: Kathleene Hazel, MD;   Location: [facility];  Service: Cardiovascular;  Laterality: N/A;   ESOPHAGOGASTRODUODENOSCOPY (EGD) WITH PROPOFOL N/A 11/14/2016   Dr. Darrick Penna: Esophagus normal. Moderate gastritis, few gastric polyps. Duodenal biopsies negative. Fundic gland polyp gastric polyp area no H pylori.   FOOT SURGERY     LEFT HEART CATH AND CORONARY ANGIOGRAPHY N/A 03/22/2018   Procedure: LEFT HEART CATH AND CORONARY ANGIOGRAPHY;  Surgeon: Kathleene Hazel, MD;  Location: [facility];  Service: Cardiovascular;  Laterality: N/A;   LEG SURGERY Right    POLYPECTOMY  04/20/2022   Procedure: POLYPECTOMY;  Surgeon: Lanelle Bal, DO;  Location: [facility]  END[facility];  Service: Endoscopy;;   REPLACEMENT TOTAL KNEE Right    SPINAL CORD STIMULATOR IMPLANT     FAMILY HISTORY Family History  Problem Relation Age of Onset   Cancer Mother 53       colon   Cancer Father        renal cell   Colon cancer Father    Breast cancer Sister    Cancer Sister        primary brain, and primary breast too   Breast cancer Sister    Cancer Sister        breast    Breast cancer Sister    Breast cancer Sister    Cancer Brother        kidney and liver   Sleep apnea Neg Hx    SOCIAL HISTORY Social History   Tobacco Use   Smoking status: Never   Smokeless tobacco: Never  Vaping Use   Vaping Use: Never used  Substance Use Topics   Alcohol use: No    Alcohol/week: 0.0 standard drinks of alcohol   Drug use: No       OPHTHALMIC EXAM:  Base Eye Exam     Visual Acuity (Snellen - Linear)       Right Left   Dist cc 20/25 -2 20/20 -2    Correction: Glasses         Tonometry (Tonopen, 7:50 AM)       Right Left   Pressure 18 19         Pupils       Pupils Dark Light Shape React APD   Right PERRL 3 2 Round Brisk None   Left PERRL 3 2 Round Brisk None         Visual Fields       Left Right    Full Full         Extraocular Movement       Right Left    Full, Ortho Full, Ortho          Neuro/Psych     Oriented x3: Yes   Mood/Affect: Normal         Dilation     Both eyes: 2.5% Phenylephrine @ 7:50 AM           Slit Lamp and Fundus Exam     Slit Lamp Exam       Right Left   Lids/Lashes Dermatochalasis - upper lid, mild MGD Dermatochalasis - upper lid, mild MGD   Conjunctiva/Sclera nasal pingeucula nasal pingeucula, Trace Injection   Cornea arcus, mild tear film debris arcus, mild tear film debris   Anterior Chamber deep and clear, no cell or flare deep and clear, no cell or flare   Iris Round and dilated, No NVI Round and dilated, No NVI   Lens 2+ Nuclear sclerosis, 2-3+ Cortical cataract, 1+ Posterior subcapsular cataract 2+ Nuclear sclerosis, 2-3+ Cortical cataract, 1+ Posterior subcapsular cataract   Anterior Vitreous Vitreous syneresis Vitreous syneresis         Fundus Exam       Right Left   Disc Pink and Sharp, no NVD, temporal PPA Pink and Sharp, no NVD, temporal PPP   C/D Ratio 0.4 0.4   Macula Blunted foveal reflex, +cystic changes - persistent, scattered MA -- improving Flat, good foveal reflex, scattered MA -- improved, trace cystic changes   Vessels attenuated, Tortuous attenuated, Tortuous, +NV   Periphery Attached, dense 360 PRP extending posteriorly to arcades Attached,  360 PRP with good posterior fill in, room for fill in if needed           Refraction     Wearing Rx       Sphere Cylinder Axis Add   Right +1.75 +1.50 124 +2.50   Left +4.25 +2.25 001 +2.50           IMAGING AND PROCEDURES  Imaging and Procedures for 12/01/2022  OCT, Retina - OU - Both Eyes       Right Eye Quality was good. Central Foveal Thickness: 354. Progression has worsened. Findings include normal foveal contour, no SRF, intraretinal fluid (persistent IRF/cystic changes centrally -- slightly increased).   Left Eye Quality was good. Central Foveal Thickness: 302. Progression has been stable. Findings include normal foveal contour, no SRF,  intraretinal fluid (Persistent cystic changes temporal macula ).   Notes *Images captured and stored on drive  Diagnosis / Impression:  +DME OU OD: persistent IRF/cystic changes centrally -- slightly increased OS: persistent cystic changes temporal macula   Clinical management:  See below  Abbreviations: NFP - Normal foveal profile. CME - cystoid macular edema. PED - pigment epithelial detachment. IRF - intraretinal fluid. SRF - subretinal fluid. EZ - ellipsoid zone. ERM - epiretinal membrane. ORA - outer retinal atrophy. ORT - outer retinal tubulation. SRHM - subretinal hyper-reflective material. IRHM - intraretinal hyper-reflective material      Intravitreal Injection, Pharmacologic Agent - OD - Right Eye       Time Out 12/01/2022. 7:51 AM. Confirmed correct patient, procedure, site, and patient consented.   Anesthesia Topical anesthesia was used. Anesthetic medications included Lidocaine 2%, Proparacaine 0.5%.   Procedure Preparation included 5% betadine to ocular surface, eyelid speculum. A (32g) needle was used.   Injection: 2 mg aflibercept 2 MG/0.05ML   Route: Intravitreal, Site: Right Eye   NDC: L6038910, Lot: [id number], Expiration date: 11/28/2023, Waste: 0 mL   Post-op Post injection exam found visual acuity of at least counting fingers. The patient tolerated the procedure well. There were no complications. The patient received written and verbal post procedure care education.      Intravitreal Injection, Pharmacologic Agent - OS - Left Eye       Time Out 12/01/2022. 7:51 AM. Confirmed correct patient, procedure, site, and patient consented.   Anesthesia Topical anesthesia was used. Anesthetic medications included Lidocaine 2%, Proparacaine 0.5%.   Procedure Preparation included 5% betadine to ocular surface, eyelid speculum. A (32g) needle was used.   Injection: 1.25 mg Bevacizumab 1.25mg /0.2ml   Route: Intravitreal, Site: Left Eye   NDC:  P3213405, Lot: [id number], Expiration date: 01/22/2023   Post-op Post injection exam found visual acuity of at least counting fingers. The patient tolerated the procedure well. There were no complications. The patient received written and verbal post procedure care education. Post injection medications were not given.            ASSESSMENT/PLAN:    ICD-10-CM   1. Proliferative diabetic retinopathy of both eyes with macular edema associated with type 2 diabetes mellitus (HCC)  E11.3513 OCT, Retina - OU - Both Eyes    Intravitreal Injection, Pharmacologic Agent - OD - Right Eye    Intravitreal Injection, Pharmacologic Agent - OS - Left Eye    aflibercept (EYLEA) SOLN 2 mg    Bevacizumab (AVASTIN) SOLN 1.25 mg    2. Long term (current) use of oral hypoglycemic drugs  Z79.84     3. Long-term (current) use of injectable non-insulin antidiabetic drugs  Z79.85     4. Current use of insulin (HCC)  Z79.4     5. Essential hypertension  I10     6. Hypertensive retinopathy of both eyes  H35.033     7. Combined forms of age-related cataract of both eyes  H25.813       1-4. Proliferative diabetic retinopathy OU - previously managed by Dr. Cathey Endow -- lost to f/u since 2020 - s/p PRP OU and IVA OS x7 (BB) - s/p IVA OD #1 (01.02.24), #2 (02.02.24), #3 (03.01.24), #4(04.05.24) - s/p IVA OS #1 (01.04.24), #2 (02.02.24), #3 (03.01.24), #4(04.05.24) - s/p PRP fill in OS (01.23.24) - exam shows scattered MA OU; laser PRP OU - FA (01.02.23) shows OD: Cluster of leakage temporal macula, no NV; OS: Scattered focal NVE greatest inferior and temporal quads -- will need PRP fill in OS - OCT shows OD: persistent IRF/cystic changes centrally -- slightly increased; OS: persistent cystic changes temporal macula at 4 weeks **discussed resistance to Avastin and potential benefit of switching medication** - recommend IVE OD #1 (for persistent/worsening DME) and IVA OS #5 today, 05.03.24 w/ f/u at 4 wks - pt  wishes to proceed with injections - RBA of procedure discussed, questions answered - see procedure note - IVE informed consent obtained and signed, 05.03.24 (OU) - IVA informed consent obtained and signed, 01.02.24 (OU) - f/u 4 weeks -- DFE, OCT  5,6. Hypertensive retinopathy OU - discussed importance of tight BP control - monitor  7. Mixed Cataract OU - The symptoms of cataract, surgical options, and treatments and risks were discussed with patient. - discussed diagnosis and progression - monitor  Ophthalmic Meds Ordered this visit:  Meds ordered this encounter  Medications   aflibercept (EYLEA) SOLN 2 mg   Bevacizumab (AVASTIN) SOLN 1.25 mg     Return in about 4 weeks (around 12/29/2022) for f/u PDR OU, DFE, OCT.  There are no Patient Instructions on file for this visit.   This document serves as a record of services personally performed by Karie Chimera, MD, PhD. It was created on their behalf by Berlin Hun COT, an ophthalmic technician. The creation of this record is the provider's dictation and/or activities during the visit.    Electronically signed by: Berlin Hun COT 05/01/20241:49 PM  This document serves as a record of services personally performed by Karie Chimera, MD, PhD. It was created on their behalf by Glee Arvin. Manson Passey, OA an ophthalmic technician. The creation of this record is the provider's dictation and/or activities during the visit.    Electronically signed by: Glee Arvin. Manson Passey, [place] 05.03.2024 1:49 PM  Karie Chimera, M.D., Ph.D. Diseases & Surgery of the Retina and Vitreous [facility] 12/01/2022   I have reviewed the above documentation for accuracy and completeness, and I agree with the above. Karie Chimera, M.D., Ph.D. 12/01/22 1:52 PM   Abbreviations: M myopia (nearsighted); A astigmatism; H hyperopia (farsighted); P presbyopia; Mrx spectacle prescription;  CTL contact lenses; OD right eye; OS left eye;  OU both eyes  XT exotropia; ET esotropia; PEK punctate epithelial keratitis; PEE punctate epithelial erosions; DES dry eye syndrome; MGD meibomian gland dysfunction; ATs artificial tears; PFAT's preservative free artificial tears; NSC nuclear sclerotic cataract; PSC posterior subcapsular cataract; ERM epi-retinal membrane; PVD posterior vitreous detachment; RD retinal detachment; DM diabetes mellitus; DR diabetic retinopathy; NPDR non-proliferative diabetic retinopathy; PDR proliferative diabetic retinopathy; CSME clinically significant macular edema; DME diabetic macular edema; dbh dot blot  hemorrhages; CWS cotton wool spot; POAG primary open angle glaucoma; C/D cup-to-disc ratio; HVF humphrey visual field; GVF goldmann visual field; OCT optical coherence tomography; IOP intraocular pressure; BRVO Branch retinal vein occlusion; CRVO central retinal vein occlusion; CRAO central retinal artery occlusion; BRAO branch retinal artery occlusion; RT retinal tear; SB scleral buckle; PPV pars plana vitrectomy; VH Vitreous hemorrhage; PRP panretinal laser photocoagulation; IVK intravitreal kenalog; VMT vitreomacular traction; MH Macular hole;  NVD neovascularization of the disc; NVE neovascularization elsewhere; AREDS age related eye disease study; ARMD age related macular degeneration; POAG primary open angle glaucoma; EBMD epithelial/anterior basement membrane dystrophy; ACIOL anterior chamber intraocular lens; IOL intraocular lens; PCIOL posterior chamber intraocular lens; Phaco/IOL phacoemulsification with intraocular lens placement; [place] photorefractive keratectomy; LASIK laser assisted in situ keratomileusis; HTN hypertension; DM diabetes mellitus; COPD chronic obstructive pulmonary disease

## 2022-12-01 ENCOUNTER — Encounter (INDEPENDENT_AMBULATORY_CARE_PROVIDER_SITE_OTHER): Payer: Self-pay | Admitting: Ophthalmology

## 2022-12-01 ENCOUNTER — Ambulatory Visit (INDEPENDENT_AMBULATORY_CARE_PROVIDER_SITE_OTHER): Payer: Medicare Other | Admitting: Ophthalmology

## 2022-12-01 DIAGNOSIS — Z794 Long term (current) use of insulin: Secondary | ICD-10-CM

## 2022-12-01 DIAGNOSIS — E113513 Type 2 diabetes mellitus with proliferative diabetic retinopathy with macular edema, bilateral: Secondary | ICD-10-CM | POA: Diagnosis not present

## 2022-12-01 DIAGNOSIS — H35033 Hypertensive retinopathy, bilateral: Secondary | ICD-10-CM | POA: Diagnosis not present

## 2022-12-01 DIAGNOSIS — Z7985 Long-term (current) use of injectable non-insulin antidiabetic drugs: Secondary | ICD-10-CM | POA: Diagnosis not present

## 2022-12-01 DIAGNOSIS — H25813 Combined forms of age-related cataract, bilateral: Secondary | ICD-10-CM

## 2022-12-01 DIAGNOSIS — Z7984 Long term (current) use of oral hypoglycemic drugs: Secondary | ICD-10-CM

## 2022-12-01 DIAGNOSIS — I10 Essential (primary) hypertension: Secondary | ICD-10-CM | POA: Diagnosis not present

## 2022-12-01 MED ORDER — BEVACIZUMAB CHEMO INJECTION 1.25MG/0.05ML SYRINGE FOR KALEIDOSCOPE
1.2500 mg | INTRAVITREAL | Status: AC | PRN
  Administered 2022-12-01: 1.25 mg via INTRAVITREAL

## 2022-12-01 MED ORDER — AFLIBERCEPT 2MG/0.05ML IZ SOLN FOR KALEIDOSCOPE
2.0000 mg | INTRAVITREAL | Status: AC | PRN
  Administered 2022-12-01: 2 mg via INTRAVITREAL

## 2022-12-06 ENCOUNTER — Ambulatory Visit (INDEPENDENT_AMBULATORY_CARE_PROVIDER_SITE_OTHER): Payer: Medicare Other

## 2022-12-06 DIAGNOSIS — N6341 Unspecified lump in right breast, subareolar: Secondary | ICD-10-CM | POA: Diagnosis not present

## 2022-12-06 DIAGNOSIS — J309 Allergic rhinitis, unspecified: Secondary | ICD-10-CM

## 2022-12-06 DIAGNOSIS — R92321 Mammographic fibroglandular density, right breast: Secondary | ICD-10-CM | POA: Diagnosis not present

## 2022-12-06 DIAGNOSIS — N6489 Other specified disorders of breast: Secondary | ICD-10-CM | POA: Diagnosis not present

## 2022-12-07 ENCOUNTER — Encounter: Payer: Self-pay | Admitting: Neurology

## 2022-12-11 ENCOUNTER — Ambulatory Visit (INDEPENDENT_AMBULATORY_CARE_PROVIDER_SITE_OTHER): Payer: Medicare Other | Admitting: Adult Health

## 2022-12-11 VITALS — BP 145/71 | HR 76 | Ht 68.0 in | Wt 245.0 lb

## 2022-12-11 DIAGNOSIS — G4733 Obstructive sleep apnea (adult) (pediatric): Secondary | ICD-10-CM

## 2022-12-11 NOTE — Progress Notes (Signed)
PATIENT: Jodi Ray DOB: September 09, 1960  REASON FOR VISIT: follow up HISTORY FROM: patient  Chief Complaint  Patient presents with   Follow-up    Rm 8, alone.  Doing well.       HISTORY OF PRESENT ILLNESS:  Today 12/11/22:  Jodi Ray is a 62 y.o. female with a history of OSA on CPAP. Returns today for follow-up. Reports that CPAP is working well. Continues to find it beneficial. Can't sleep without it she states.  Download is below        HISTORY 12/07/21: Ms. Jodi Ray is a 62 year old female with a history of OSA on CPAP. Denies any new issues. She returns today for follow-up.     10/21/20: Ms. Jodi Ray is a 62 year old female with a history of obstructive sleep apnea on CPAP.  She returns today for follow-up.  We were unable to get a download today.  The patient has to take her machine to [facility] in order for them to pull the data.  Reports that she will do that within the next week.  Reports that she uses the CPAP nightly.  She denies any new issues.  Reports that she does need a new filter and tubing.  Returns today for an evaluation.  10/22/19: Ms. Jodi Ray is a 62 year old female with a history of obstructive sleep apnea on CPAP.  Her download indicates that she use her machine nightly for compliance of 100%.  She use her machine greater than 4 hours each night.  On average she uses her machine 8 hours and 17 minutes.  Her residual AHI is 0.5 on 11.6 cm of water with EPR 2.  She reports that her CPAP is working well for her.  She does use a cloth mask and reports that she has a hard time getting this every 3 months from her DME company.  HISTORY (Copied from Dr.Dohmeier's note) 04-24-2019.  this patient returns a sleep study and is now on CPAP with good compliance.  She gets new supplies, a new sleep study was not ordered. The current settings are 11.6 cm water with 2 cm EPR, 100% compliance.  This is been better has used her machine every day of the last 30 days  between 25 August on 23 April 2019 average usage time 7 hours and 43 minutes.  Her residual AHI is 0.6/h she does have moderate air leaks, she does have obstructive sleep apnea and rare central events.  Her Epworth Sleepiness Scale has been endorsed at only 2 points at her fatigue severity at 9 points her geriatric depression score was endorsed at 9 out of 15 points.  REVIEW OF SYSTEMS: Out of a complete 14 system review of symptoms, the patient complains only of the following symptoms, and all other reviewed systems are negative.  ESS 1   ALLERGIES: Allergies  Allergen Reactions   Other Nausea And Vomiting and Other (See Comments)    Opiates Alters mental status diarrhea   Fentanyl Nausea And Vomiting and Other (See Comments)    Nausea, vomiting, loss of consciousness, requiring reversal    HOME MEDICATIONS: Outpatient Medications Prior to Visit  Medication Sig Dispense Refill   ascorbic acid (VITAMIN C) 500 MG tablet Take by mouth.     aspirin EC 81 MG tablet Take 1 tablet (81 mg total) by mouth daily. Swallow whole. 90 tablet 3   atorvastatin (LIPITOR) 80 MG tablet TAKE 1 TABLET BY MOUTH ONCE DAILY AT  6PM 90 tablet 2  calcitRIOL (ROCALTROL) 0.25 MCG capsule Take 0.25 mcg by mouth every other day.     carvedilol (COREG) 3.125 MG tablet Take by mouth.     cholecalciferol (VITAMIN D3) 10 MCG (400 UNIT) TABS tablet Take by mouth.     clopidogrel (PLAVIX) 75 MG tablet Take by mouth.     Continuous Blood Gluc Sensor (DEXCOM G6 SENSOR) MISC 1 Device by Other route as directed.     EPINEPHrine 0.3 mg/0.3 mL IJ SOAJ injection Inject 0.3 mg into the muscle as needed for anaphylaxis. 2 each 1   fish oil-omega-3 fatty acids 1000 MG capsule Take 1 g by mouth in the morning and at bedtime.     furosemide (LASIX) 20 MG tablet Take 20 mg by mouth. Take 2 tablets in the morning and 1 tablet in the evening     furosemide (LASIX) 40 MG tablet Take 20-40 mg by mouth 2 (two) times daily. 40 MG  AM, 20 MG PM     Galcanezumab-gnlm (EMGALITY) 120 MG/ML SOAJ 120 mg every 30 (thirty) days.     Glucagon (GVOKE HYPOPEN 2-PACK) 1 MG/0.2ML SOAJ      glucose blood ([employer] BLOOD GLUCOSE TEST) test strip      hydrochlorothiazide (MICROZIDE) 12.5 MG capsule Take by mouth.     Insulin Human (INSULIN PUMP) SOLN Inject into the skin continuous.      Insulin Pen Needle (PEN NEEDLES 31GX5/16") 31G X 8 MM MISC as directed subcutaneous Twice a day for 30 days     JARDIANCE 10 MG TABS tablet Take 5 mg by mouth every other day.     KERENDIA 10 MG TABS Take 1 tablet by mouth daily.     levothyroxine (SYNTHROID) 112 MCG tablet Take 112 mcg by mouth daily before breakfast.     lisinopril (ZESTRIL) 10 MG tablet Take by mouth.     metFORMIN (GLUCOPHAGE) 500 MG tablet Take 1 tablet (500 mg total) by mouth 2 (two) times daily with a meal.     nitroGLYCERIN (NITROSTAT) 0.4 MG SL tablet Place 1 tablet (0.4 mg total) under the tongue every 5 (five) minutes as needed for chest pain. 25 tablet 3   NOVOLOG 100 UNIT/ML injection Inject into the skin continuous. In insulin pump  0   pantoprazole (PROTONIX) 40 MG tablet Take 1 tablet by mouth once daily 90 tablet 0   triamcinolone ointment (KENALOG) 0.1 % Apply 1 Application topically 2 (two) times daily. 30 g 0   loratadine (CLARITIN) 10 MG tablet Take 2 tablets (20 mg total) by mouth daily. 180 tablet 3   meclizine (ANTIVERT) 25 MG tablet Take 25 mg by mouth every 6 (six) hours as needed.     No facility-administered medications prior to visit.    PAST MEDICAL HISTORY: Past Medical History:  Diagnosis Date   Adult RDS (HCC)    Anemia    Brain aneurysm    frontal lobe   CAD (coronary artery disease)    a. 02/2018: s/p DES to Proximal LAD with residual 20% RCA stenosis.   Chronic pain    Diabetes mellitus without complication (HCC)    Headache    History of left bundle branch block (LBBB)    HTN (hypertension)    Hx of cardiovascular stress test 10/2016    intermediate risk study   Hypothyroidism    Neuromuscular disorder (HCC)    Neuropathy    Obesity    OSA on CPAP 03/10/2015   Renal insufficiency  Retinopathy    Sleep apnea    Varicose veins of both lower extremities     PAST SURGICAL HISTORY: Past Surgical History:  Procedure Laterality Date   BIOPSY  11/14/2016   Procedure: BIOPSY;  Surgeon: West Bali, MD;  [facility];  Service: Endoscopy;;  colon gastric duodenal   BREAST EXCISIONAL BIOPSY Right    BREAST SURGERY Left 1994   Lumpectomy   COLONOSCOPY WITH PROPOFOL N/A 11/14/2016   Dr. Darrick Penna: Moderately redundant rectosigmoid colon. Random colon biopsies benign. Internal hemorrhoids. Surveillance colonoscopy in 5 years.   COLONOSCOPY WITH PROPOFOL N/A 04/20/2022   Procedure: COLONOSCOPY WITH PROPOFOL;  Surgeon: Lanelle Bal, DO;  [facility];  Service: Endoscopy;  Laterality: N/A;  12:30am   CORONARY STENT INTERVENTION N/A 03/22/2018   Procedure: CORONARY STENT INTERVENTION;  Surgeon: Kathleene Hazel, MD;  [facility];  Service: Cardiovascular;  Laterality: N/A;   ESOPHAGOGASTRODUODENOSCOPY (EGD) WITH PROPOFOL N/A 11/14/2016   Dr. Darrick Penna: Esophagus normal. Moderate gastritis, few gastric polyps. Duodenal biopsies negative. Fundic gland polyp gastric polyp area no H pylori.   FOOT SURGERY     LEFT HEART CATH AND CORONARY ANGIOGRAPHY N/A 03/22/2018   Procedure: LEFT HEART CATH AND CORONARY ANGIOGRAPHY;  Surgeon: Kathleene Hazel, MD;  [facility];  Service: Cardiovascular;  Laterality: N/A;   LEG SURGERY Right    POLYPECTOMY  04/20/2022   Procedure: POLYPECTOMY;  Surgeon: Lanelle Bal, DO;  [facility];  Service: Endoscopy;;   REPLACEMENT TOTAL KNEE Right    SPINAL CORD STIMULATOR IMPLANT      FAMILY HISTORY: Family History  Problem Relation Age of Onset   Cancer Mother 46       colon   Cancer Father        renal cell    Colon cancer Father    Breast cancer Sister    Cancer Sister        primary brain, and primary breast too   Breast cancer Sister    Cancer Sister        breast    Breast cancer Sister    Breast cancer Sister    Cancer Brother        kidney and liver   Sleep apnea Neg Hx     SOCIAL HISTORY: Social History   Socioeconomic History   Marital status: Widowed    Spouse name: Not on file   Number of children: 0   Years of education: Not on file   Highest education level: Bachelor's degree (e.g., BA, AB, BS)  Occupational History   Occupation: Retired  Tobacco Use   Smoking status: Never   Smokeless tobacco: Never  Vaping Use   Vaping Use: Never used  Substance and Sexual Activity   Alcohol use: No    Alcohol/week: 0.0 standard drinks of alcohol   Drug use: No   Sexual activity: Not Currently    Partners: Male  Other Topics Concern   Not on file  Social History Narrative   Drinks about 1 cup coffee day.   Cardiac rehab 3x week   One level home with significant other   Bachelors in Accounting      Right handed   No children   widow   Social Determinants of Health   Financial Resource Strain: Not on file  Food Insecurity: Not on file  Transportation Needs: Not on file  Physical Activity: Not on file  Stress:  Not on file  Social Connections: Not on file  Intimate Partner Violence: Not on file      PHYSICAL EXAM  Vitals:   12/11/22 0804  BP: (!) 145/71  Pulse: 76  Weight: 245 lb (111.1 kg)  Height: 5\' 8"  (1.727 m)   Body mass index is 37.25 kg/m.  Generalized: Well developed, in no acute distress  Chest: Lungs clear to auscultation bilaterally  Neurological examination  Mentation: Alert oriented to time, place, history taking. Follows all commands speech and language fluent Cranial nerve II-XII: Facial symmetry noted  DIAGNOSTIC DATA (LABS, IMAGING, TESTING) - I reviewed patient records, labs, notes, testing and imaging myself where  available.  Lab Results  Component Value Date   WBC 6.0 10/05/2022   HGB 11.6 (L) 10/05/2022   HCT 36.8 10/05/2022   MCV 87.0 10/05/2022   PLT 245 10/05/2022      Component Value Date/Time   NA 137 10/05/2022 0825   K 4.1 10/05/2022 0825   CL 98 10/05/2022 0825   CO2 29 10/05/2022 0825   GLUCOSE 142 (H) 10/05/2022 0825   BUN 35 (H) 10/05/2022 0825   CREATININE 1.26 (H) 10/05/2022 0825   CREATININE 0.98 07/23/2015 0707   CALCIUM 8.9 10/05/2022 0825   CALCIUM 9.4 06/28/2022 0833   PROT 7.7 06/28/2022 0831   ALBUMIN 3.5 10/05/2022 0825   AST 21 06/28/2022 0831   ALT 18 06/28/2022 0831   ALKPHOS 101 06/28/2022 0831   BILITOT 0.4 06/28/2022 0831   GFRNONAA 48 (L) 10/05/2022 0825   GFRAA >60 02/04/2020 1216   Lab Results  Component Value Date   CHOL 134 04/16/2018   HDL 75 04/16/2018   LDLCALC 54 04/16/2018   TRIG 23 04/16/2018   CHOLHDL 1.8 04/16/2018   Lab Results  Component Value Date   HGBA1C 6.3 (H) 06/28/2022   Lab Results  Component Value Date   VITAMINB12 342 11/23/2021   Lab Results  Component Value Date   TSH 1.520 06/28/2022      ASSESSMENT AND PLAN 62 y.o. year old female  has a past medical history of Adult RDS (HCC), Anemia, Brain aneurysm, CAD (coronary artery disease), Chronic pain, Diabetes mellitus without complication (HCC), Headache, History of left bundle branch block (LBBB), HTN (hypertension), cardiovascular stress test (10/2016), Hypothyroidism, Neuromuscular disorder (HCC), Neuropathy, Obesity, OSA on CPAP (03/10/2015), Renal insufficiency, Retinopathy, Sleep apnea, and Varicose veins of both lower extremities. here with:  OSA on CPAP  - CPAP compliance excellent - Good treatment of AHI  - Encourage patient to use CPAP nightly and > 4 hours each night - F/U in 1 year or sooner if needed   Butch Penny, MSN, NP-C 12/11/2022, 8:27 AM [facility] [address], [address] [place], [place] [address] [phone]

## 2022-12-11 NOTE — Patient Instructions (Signed)
Continue using CPAP nightly and greater than 4 hours each night °If your symptoms worsen or you develop new symptoms please let us know.  ° °

## 2022-12-13 ENCOUNTER — Ambulatory Visit (INDEPENDENT_AMBULATORY_CARE_PROVIDER_SITE_OTHER): Payer: Medicare Other

## 2022-12-13 DIAGNOSIS — J309 Allergic rhinitis, unspecified: Secondary | ICD-10-CM

## 2022-12-20 ENCOUNTER — Ambulatory Visit (INDEPENDENT_AMBULATORY_CARE_PROVIDER_SITE_OTHER): Payer: Medicare Other

## 2022-12-20 DIAGNOSIS — J309 Allergic rhinitis, unspecified: Secondary | ICD-10-CM

## 2022-12-27 ENCOUNTER — Ambulatory Visit (INDEPENDENT_AMBULATORY_CARE_PROVIDER_SITE_OTHER): Payer: Medicare Other

## 2022-12-27 DIAGNOSIS — J309 Allergic rhinitis, unspecified: Secondary | ICD-10-CM | POA: Diagnosis not present

## 2022-12-27 NOTE — Progress Notes (Unsigned)
Referring Provider: Kirstie Peri, MD Primary Care Physician:  Kirstie Peri, MD Primary GI Physician: Dr. Marletta Lor  No chief complaint on file.   HPI:   Jodi Ray is a 62 y.o. female with a history of anemia, brain aneurysm, CAD s/p stent in 2019, diabetes, chronic pain, HTN, hypothyroidism, OSA on CPAP, and CKD stage IIIb presenting today  ***   Last seen in our office 02/21/2022 to discuss scheduling colonoscopy.  Reported having more diarrhea in the summer related to increased fruit and vegetable consumption.  In the winter, she had more grains and beans.  This pattern was not new for her.  Reported always having pain in the upper abdomen, maintained on pantoprazole 40 mg daily.  Denied overt GI bleeding, dysphagia, unintentional weight loss.  She was going to the gym regularly.  She was scheduled for colonoscopy, continued on pantoprazole.  Colonoscopy completed 04/20/2022 with nonbleeding internal hemorrhoids, 3 mm cecal polyp removed, otherwise normal exam.  Recommended 5-year surveillance due to family history of colon cancer.  Pathology with tubular adenoma.  Today:   Last EGD April 2018 with moderate gastritis and benign gastric polyp. Also with normal duodenal biopsies.   Colonoscopy in 2018 with evidence of internal hemorrhoids, redundant left colon, and negative random colon biopsies for microscopic colitis.   HIDA with normal gallbladder EF in 2018, but patient reported pain after Ensure ingestion.  Abdominal ultrasound in 2018 with hepatic steatosis, mildly distended gallbladder, sonographic Murphy sign.  CT A/P with contrast in 2018 with fairly large amount of stool in the colon, small hiatal hernia, minimal ventral hernia containing fat only. Past Medical History:  Diagnosis Date   Adult RDS (HCC)    Anemia    Brain aneurysm    frontal lobe   CAD (coronary artery disease)    a. 02/2018: s/p DES to Proximal LAD with residual 20% RCA stenosis.   Chronic pain     Diabetes mellitus without complication (HCC)    Headache    History of left bundle branch block (LBBB)    HTN (hypertension)    Hx of cardiovascular stress test 10/2016   intermediate risk study   Hypothyroidism    Neuromuscular disorder (HCC)    Neuropathy    Obesity    OSA on CPAP 03/10/2015   Renal insufficiency    Retinopathy    Sleep apnea    Varicose veins of both lower extremities     Past Surgical History:  Procedure Laterality Date   BIOPSY  11/14/2016   Procedure: BIOPSY;  Surgeon: West Bali, MD;  [facility];  Service: Endoscopy;;  colon gastric duodenal   BREAST EXCISIONAL BIOPSY Right    BREAST SURGERY Left 1994   Lumpectomy   COLONOSCOPY WITH PROPOFOL N/A 11/14/2016   Dr. Darrick Penna: Moderately redundant rectosigmoid colon. Random colon biopsies benign. Internal hemorrhoids. Surveillance colonoscopy in 5 years.   COLONOSCOPY WITH PROPOFOL N/A 04/20/2022   Procedure: COLONOSCOPY WITH PROPOFOL;  Surgeon: Lanelle Bal, DO;  [facility];  Service: Endoscopy;  Laterality: N/A;  12:30am   CORONARY STENT INTERVENTION N/A 03/22/2018   Procedure: CORONARY STENT INTERVENTION;  Surgeon: Kathleene Hazel, MD;  [facility];  Service: Cardiovascular;  Laterality: N/A;   ESOPHAGOGASTRODUODENOSCOPY (EGD) WITH PROPOFOL N/A 11/14/2016   Dr. Darrick Penna: Esophagus normal. Moderate gastritis, few gastric polyps. Duodenal biopsies negative. Fundic gland polyp gastric polyp area no H pylori.   FOOT SURGERY  LEFT HEART CATH AND CORONARY ANGIOGRAPHY N/A 03/22/2018   Procedure: LEFT HEART CATH AND CORONARY ANGIOGRAPHY;  Surgeon: Kathleene Hazel, MD;  [facility];  Service: Cardiovascular;  Laterality: N/A;   LEG SURGERY Right    POLYPECTOMY  04/20/2022   Procedure: POLYPECTOMY;  Surgeon: Lanelle Bal, DO;  [facility];  Service: Endoscopy;;   REPLACEMENT TOTAL KNEE Right    SPINAL CORD  STIMULATOR IMPLANT      Current Outpatient Medications  Medication Sig Dispense Refill   ascorbic acid (VITAMIN C) 500 MG tablet Take by mouth.     aspirin EC 81 MG tablet Take 1 tablet (81 mg total) by mouth daily. Swallow whole. 90 tablet 3   atorvastatin (LIPITOR) 80 MG tablet TAKE 1 TABLET BY MOUTH ONCE DAILY AT  6PM 90 tablet 2   calcitRIOL (ROCALTROL) 0.25 MCG capsule Take 0.25 mcg by mouth every other day.     carvedilol (COREG) 3.125 MG tablet Take by mouth.     cholecalciferol (VITAMIN D3) 10 MCG (400 UNIT) TABS tablet Take by mouth.     clopidogrel (PLAVIX) 75 MG tablet Take by mouth.     Continuous Blood Gluc Sensor (DEXCOM G6 SENSOR) MISC 1 Device by Other route as directed.     EPINEPHrine 0.3 mg/0.3 mL IJ SOAJ injection Inject 0.3 mg into the muscle as needed for anaphylaxis. 2 each 1   fish oil-omega-3 fatty acids 1000 MG capsule Take 1 g by mouth in the morning and at bedtime.     furosemide (LASIX) 20 MG tablet Take 20 mg by mouth. Take 2 tablets in the morning and 1 tablet in the evening     furosemide (LASIX) 40 MG tablet Take 20-40 mg by mouth 2 (two) times daily. 40 MG AM, 20 MG PM     Galcanezumab-gnlm (EMGALITY) 120 MG/ML SOAJ 120 mg every 30 (thirty) days.     Glucagon (GVOKE HYPOPEN 2-PACK) 1 MG/0.2ML SOAJ      glucose blood ([employer] BLOOD GLUCOSE TEST) test strip      hydrochlorothiazide (MICROZIDE) 12.5 MG capsule Take by mouth.     Insulin Human (INSULIN PUMP) SOLN Inject into the skin continuous.      Insulin Pen Needle (PEN NEEDLES 31GX5/16") 31G X 8 MM MISC as directed subcutaneous Twice a day for 30 days     JARDIANCE 10 MG TABS tablet Take 5 mg by mouth every other day.     KERENDIA 10 MG TABS Take 1 tablet by mouth daily.     levothyroxine (SYNTHROID) 112 MCG tablet Take 112 mcg by mouth daily before breakfast.     lisinopril (ZESTRIL) 10 MG tablet Take by mouth.     loratadine (CLARITIN) 10 MG tablet Take 2 tablets (20 mg total) by mouth daily. 180 tablet  3   metFORMIN (GLUCOPHAGE) 500 MG tablet Take 1 tablet (500 mg total) by mouth 2 (two) times daily with a meal.     nitroGLYCERIN (NITROSTAT) 0.4 MG SL tablet Place 1 tablet (0.4 mg total) under the tongue every 5 (five) minutes as needed for chest pain. 25 tablet 3   NOVOLOG 100 UNIT/ML injection Inject into the skin continuous. In insulin pump  0   pantoprazole (PROTONIX) 40 MG tablet Take 1 tablet by mouth once daily 90 tablet 0   triamcinolone ointment (KENALOG) 0.1 % Apply 1 Application topically 2 (two) times daily. 30 g 0   No current facility-administered medications for this visit.  Allergies as of 12/28/2022 - Review Complete 12/11/2022  Allergen Reaction Noted   Other Nausea And Vomiting and Other (See Comments) 08/04/2015   Fentanyl Nausea And Vomiting and Other (See Comments) 11/09/2016    Family History  Problem Relation Age of Onset   Cancer Mother 88       colon   Cancer Father        renal cell   Colon cancer Father    Breast cancer Sister    Cancer Sister        primary brain, and primary breast too   Breast cancer Sister    Cancer Sister        breast    Breast cancer Sister    Breast cancer Sister    Cancer Brother        kidney and liver   Sleep apnea Neg Hx     Social History   Socioeconomic History   Marital status: Widowed    Spouse name: Not on file   Number of children: 0   Years of education: Not on file   Highest education level: Bachelor's degree (e.g., BA, AB, BS)  Occupational History   Occupation: Retired  Tobacco Use   Smoking status: Never   Smokeless tobacco: Never  Vaping Use   Vaping Use: Never used  Substance and Sexual Activity   Alcohol use: No    Alcohol/week: 0.0 standard drinks of alcohol   Drug use: No   Sexual activity: Not Currently    Partners: Male  Other Topics Concern   Not on file  Social History Narrative   Drinks about 1 cup coffee day.   Cardiac rehab 3x week   One level home with significant other    Bachelors in Accounting      Right handed   No children   widow   Social Determinants of Health   Financial Resource Strain: Not on file  Food Insecurity: Not on file  Transportation Needs: Not on file  Physical Activity: Not on file  Stress: Not on file  Social Connections: Not on file    Review of Systems: Gen: Denies fever, chills, anorexia. Denies fatigue, weakness, weight loss.  CV: Denies chest pain, palpitations, syncope, peripheral edema, and claudication. Resp: Denies dyspnea at rest, cough, wheezing, coughing up blood, and pleurisy. GI: Denies vomiting blood, jaundice, and fecal incontinence.   Denies dysphagia or odynophagia. Derm: Denies rash, itching, dry skin Psych: Denies depression, anxiety, memory loss, confusion. No homicidal or suicidal ideation.  Heme: Denies bruising, bleeding, and enlarged lymph nodes.  Physical Exam: There were no vitals taken for this visit. General:   Alert and oriented. No distress noted. Pleasant and cooperative.  Head:  Normocephalic and atraumatic. Eyes:  Conjuctiva clear without scleral icterus. Heart:  S1, S2 present without murmurs appreciated. Lungs:  Clear to auscultation bilaterally. No wheezes, rales, or rhonchi. No distress.  Abdomen:  +BS, soft, non-tender and non-distended. No rebound or guarding. No HSM or masses noted. Msk:  Symmetrical without gross deformities. Normal posture. Extremities:  Without edema. Neurologic:  Alert and  oriented x4 Psych:  Normal mood and affect.    Assessment:     Plan:  ***   Ermalinda Memos, PA-C [facility] 12/28/2022

## 2022-12-28 ENCOUNTER — Encounter: Payer: Self-pay | Admitting: Gastroenterology

## 2022-12-28 ENCOUNTER — Ambulatory Visit (INDEPENDENT_AMBULATORY_CARE_PROVIDER_SITE_OTHER): Payer: Medicare Other | Admitting: Gastroenterology

## 2022-12-28 VITALS — BP 129/70 | HR 84 | Temp 98.2°F | Ht 68.0 in | Wt 253.6 lb

## 2022-12-28 DIAGNOSIS — R1084 Generalized abdominal pain: Secondary | ICD-10-CM

## 2022-12-28 DIAGNOSIS — K5900 Constipation, unspecified: Secondary | ICD-10-CM | POA: Diagnosis not present

## 2022-12-28 DIAGNOSIS — R110 Nausea: Secondary | ICD-10-CM

## 2022-12-28 NOTE — Progress Notes (Signed)
[facility] - Clinic Note  01/01/2023     CHIEF COMPLAINT Patient presents for Retina Follow Up   HISTORY OF PRESENT ILLNESS: Jodi Ray is a 62 y.o. female who presents to the clinic today for:   HPI     Retina Follow Up   Patient presents with  Diabetic Retinopathy.  In both eyes.  This started months ago.  Duration of 4 weeks.  Since onset it is stable.  I, the attending physician,  performed the HPI with the patient and updated documentation appropriately.        Comments   Patient feels that the vision is doing well. Her blood sugar was 92 and her A1C is 5.2.       Last edited by Rennis Chris, MD on 01/01/2023 12:20 PM.     Referring physician: Kirstie Peri, MD [address] [place],  [place] [address]  HISTORICAL INFORMATION:   Selected notes from the MEDICAL RECORD NUMBER Referred by Dr. [name] for concern of DME LEE:  Ocular Hx- PMH-    CURRENT MEDICATIONS: No current outpatient medications on file. (Ophthalmic Drugs)   No current facility-administered medications for this visit. (Ophthalmic Drugs)   Current Outpatient Medications (Other)  Medication Sig   aspirin EC 81 MG tablet Take 1 tablet (81 mg total) by mouth daily. Swallow whole.   atorvastatin (LIPITOR) 80 MG tablet TAKE 1 TABLET BY MOUTH ONCE DAILY AT  6PM   calcitRIOL (ROCALTROL) 0.25 MCG capsule Take 0.25 mcg by mouth every other day.   carvedilol (COREG) 3.125 MG tablet Take by mouth.   Continuous Blood Gluc Sensor (DEXCOM G6 SENSOR) MISC 1 Device by Other route as directed.   EPINEPHrine 0.3 mg/0.3 mL IJ SOAJ injection Inject 0.3 mg into the muscle as needed for anaphylaxis.   fish oil-omega-3 fatty acids 1000 MG capsule Take 1 g by mouth in the morning and at bedtime.   furosemide (LASIX) 20 MG tablet Take 20 mg by mouth. Take 2 tablets in the morning and 1 tablet in the evening   furosemide (LASIX) 40 MG tablet Take 20-40 mg by mouth 2 (two) times daily. 40 MG AM, 20 MG  PM   Insulin Human (INSULIN PUMP) SOLN Inject into the skin continuous.    Insulin Pen Needle (PEN NEEDLES 31GX5/16") 31G X 8 MM MISC    JARDIANCE 10 MG TABS tablet Take 5 mg by mouth every other day.   KERENDIA 10 MG TABS Take 1 tablet by mouth daily.   levothyroxine (SYNTHROID) 112 MCG tablet Take 112 mcg by mouth daily before breakfast.   linaclotide (LINZESS) 290 MCG CAPS capsule Take 1 capsule (290 mcg total) by mouth daily before breakfast.   metFORMIN (GLUCOPHAGE) 500 MG tablet Take 1 tablet (500 mg total) by mouth 2 (two) times daily with a meal.   nitroGLYCERIN (NITROSTAT) 0.4 MG SL tablet Place 1 tablet (0.4 mg total) under the tongue every 5 (five) minutes as needed for chest pain.   NOVOLOG 100 UNIT/ML injection Inject into the skin continuous. In insulin pump   pantoprazole (PROTONIX) 40 MG tablet Take 1 tablet by mouth once daily   triamcinolone ointment (KENALOG) 0.1 % Apply 1 Application topically 2 (two) times daily.   loratadine (CLARITIN) 10 MG tablet Take 2 tablets (20 mg total) by mouth daily. (Patient not taking: Reported on 12/28/2022)   No current facility-administered medications for this visit. (Other)   REVIEW OF SYSTEMS: ROS   Positive for:  Endocrine, Cardiovascular, Eyes Negative for: Constitutional, Gastrointestinal, Neurological, Skin, Genitourinary, Musculoskeletal, HENT, Respiratory, Psychiatric, Allergic/Imm, Heme/Lymph Last edited by Julieanne Cotton, COT on 01/01/2023  8:51 AM.     ALLERGIES Allergies  Allergen Reactions   Other Nausea And Vomiting and Other (See Comments)    Opiates Alters mental status diarrhea   Fentanyl Nausea And Vomiting and Other (See Comments)    Nausea, vomiting, loss of consciousness, requiring reversal   PAST MEDICAL HISTORY Past Medical History:  Diagnosis Date   Adult RDS (HCC)    Anemia    Brain aneurysm    frontal lobe   CAD (coronary artery disease)    a. 02/2018: s/p DES to Proximal LAD with residual 20%  RCA stenosis.   Chronic pain    Diabetes mellitus without complication (HCC)    Headache    History of left bundle branch block (LBBB)    HTN (hypertension)    Hx of cardiovascular stress test 10/2016   intermediate risk study   Hypothyroidism    Neuromuscular disorder (HCC)    Neuropathy    Obesity    OSA on CPAP 03/10/2015   Renal insufficiency    Retinopathy    Sleep apnea    Varicose veins of both lower extremities    Past Surgical History:  Procedure Laterality Date   BIOPSY  11/14/2016   Procedure: BIOPSY;  Surgeon: West Bali, MD;  Location: [facility];  Service: Endoscopy;;  colon gastric duodenal   BREAST EXCISIONAL BIOPSY Right    BREAST SURGERY Left 1994   Lumpectomy   COLONOSCOPY WITH PROPOFOL N/A 11/14/2016   Dr. Darrick Penna: Moderately redundant rectosigmoid colon. Random colon biopsies benign. Internal hemorrhoids. Surveillance colonoscopy in 5 years.   COLONOSCOPY WITH PROPOFOL N/A 04/20/2022   Procedure: COLONOSCOPY WITH PROPOFOL;  Surgeon: Lanelle Bal, DO;  Location: [facility];  Service: Endoscopy;  Laterality: N/A;  12:30am   CORONARY STENT INTERVENTION N/A 03/22/2018   Procedure: CORONARY STENT INTERVENTION;  Surgeon: Kathleene Hazel, MD;  Location: [facility];  Service: Cardiovascular;  Laterality: N/A;   ESOPHAGOGASTRODUODENOSCOPY (EGD) WITH PROPOFOL N/A 11/14/2016   Dr. Darrick Penna: Esophagus normal. Moderate gastritis, few gastric polyps. Duodenal biopsies negative. Fundic gland polyp gastric polyp area no H pylori.   FOOT SURGERY     LEFT HEART CATH AND CORONARY ANGIOGRAPHY N/A 03/22/2018   Procedure: LEFT HEART CATH AND CORONARY ANGIOGRAPHY;  Surgeon: Kathleene Hazel, MD;  Location: [facility];  Service: Cardiovascular;  Laterality: N/A;   LEG SURGERY Right    POLYPECTOMY  04/20/2022   Procedure: POLYPECTOMY;  Surgeon: Lanelle Bal, DO;  [facility]: [facility];  Service: Endoscopy;;   REPLACEMENT TOTAL KNEE  Right    SPINAL CORD STIMULATOR IMPLANT     FAMILY HISTORY Family History  Problem Relation Age of Onset   Cancer Mother 58       colon   Cancer Father        renal cell   Colon cancer Father    Breast cancer Sister    Cancer Sister        primary brain, and primary breast too   Breast cancer Sister    Cancer Sister        breast    Breast cancer Sister    Breast cancer Sister    Cancer Brother        kidney and liver   Sleep apnea Neg Hx    SOCIAL  HISTORY Social History   Tobacco Use   Smoking status: Never   Smokeless tobacco: Never  Vaping Use   Vaping Use: Never used  Substance Use Topics   Alcohol use: No    Alcohol/week: 0.0 standard drinks of alcohol   Drug use: No       OPHTHALMIC EXAM:  Base Eye Exam     Visual Acuity (Snellen - Linear)       Right Left   Dist cc 20/25 20/20 -1   Dist ph cc NI     Correction: Glasses         Tonometry (Tonopen, 8:54 AM)       Right Left   Pressure 9 11         Pupils       Dark Light Shape React APD   Right 3 2 Round Brisk None   Left 3 2 Round Brisk None         Visual Fields       Left Right    Full Full         Extraocular Movement       Right Left    Full, Ortho Full, Ortho         Neuro/Psych     Oriented x3: Yes   Mood/Affect: Normal         Dilation     Both eyes: 1.0% Mydriacyl, 2.5% Phenylephrine @ 8:51 AM           Slit Lamp and Fundus Exam     Slit Lamp Exam       Right Left   Lids/Lashes Dermatochalasis - upper lid, mild MGD Dermatochalasis - upper lid, mild MGD   Conjunctiva/Sclera nasal pingeucula nasal pingeucula, Trace Injection   Cornea arcus, mild tear film debris arcus, mild tear film debris   Anterior Chamber deep and clear, no cell or flare deep and clear, no cell or flare   Iris Round and dilated, No NVI Round and dilated, No NVI   Lens 2+ Nuclear sclerosis, 2-3+ Cortical cataract, 1+ Posterior subcapsular cataract 2+ Nuclear sclerosis, 2-3+  Cortical cataract, 1+ Posterior subcapsular cataract   Anterior Vitreous Vitreous syneresis Vitreous syneresis         Fundus Exam       Right Left   Disc Pink and Sharp, no NVD, temporal PPA Pink and Sharp, no NVD, temporal PPP   C/D Ratio 0.4 0.4   Macula Good foveal reflex, +cystic changes - improved, scattered MA -- improved Flat, good foveal reflex, scattered MA -- improved, trace cystic changes   Vessels attenuated, Tortuous attenuated, Tortuous, +NV   Periphery Attached, dense 360 PRP extending posteriorly to arcades Attached, 360 PRP with good posterior fill in, room for fill in if needed           Refraction     Wearing Rx       Sphere Cylinder Axis Add   Right +1.75 +1.50 124 +2.50   Left +4.25 +2.25 001 +2.50           IMAGING AND PROCEDURES  Imaging and Procedures for 01/01/2023  OCT, Retina - OU - Both Eyes       Right Eye Quality was good. Central Foveal Thickness: 317. Progression has improved. Findings include normal foveal contour, no IRF, no SRF (Interval improvement in IRF/cystic changes centrally -- essentially resolved).   Left Eye Quality was good. Central Foveal Thickness: 297. Progression has improved. Findings include normal foveal contour,  no SRF, intraretinal fluid (Interval improvement in cystic changes temporal macula ).   Notes *Images captured and stored on drive  Diagnosis / Impression:  +DME OU OD: Interval improvement in IRF/cystic changes centrally -- essentially resolved OS: Interval improvement in cystic changes temporal macula   Clinical management:  See below  Abbreviations: NFP - Normal foveal profile. CME - cystoid macular edema. PED - pigment epithelial detachment. IRF - intraretinal fluid. SRF - subretinal fluid. EZ - ellipsoid zone. ERM - epiretinal membrane. ORA - outer retinal atrophy. ORT - outer retinal tubulation. SRHM - subretinal hyper-reflective material. IRHM - intraretinal hyper-reflective material       Intravitreal Injection, Pharmacologic Agent - OD - Right Eye       Time Out 01/01/2023. 9:38 AM. Confirmed correct patient, procedure, site, and patient consented.   Anesthesia Topical anesthesia was used. Anesthetic medications included Lidocaine 2%, Proparacaine 0.5%.   Procedure Preparation included 5% betadine to ocular surface, eyelid speculum. A (32g) needle was used.   Injection: 2 mg aflibercept 2 MG/0.05ML   Route: Intravitreal, Site: Right Eye   NDC: L6038910, Lot: [id number], Expiration date: 11/28/2023, Waste: 0 mL   Post-op Post injection exam found visual acuity of at least counting fingers. The patient tolerated the procedure well. There were no complications. The patient received written and verbal post procedure care education.      Intravitreal Injection, Pharmacologic Agent - OS - Left Eye       Time Out 01/01/2023. 9:38 AM. Confirmed correct patient, procedure, site, and patient consented.   Anesthesia Topical anesthesia was used. Anesthetic medications included Lidocaine 2%, Proparacaine 0.5%.   Procedure Preparation included 5% betadine to ocular surface, eyelid speculum. A (32g) needle was used.   Injection: 1.25 mg Bevacizumab 1.25mg /0.7ml   Route: Intravitreal, Site: Left Eye   NDC: P3213405, Lot: [id number], Expiration date: 03/31/2023   Post-op Post injection exam found visual acuity of at least counting fingers. The patient tolerated the procedure well. There were no complications. The patient received written and verbal post procedure care education. Post injection medications were not given.            ASSESSMENT/PLAN:    ICD-10-CM   1. Proliferative diabetic retinopathy of both eyes with macular edema associated with type 2 diabetes mellitus (HCC)  E11.3513 OCT, Retina - OU - Both Eyes    Intravitreal Injection, Pharmacologic Agent - OD - Right Eye    Intravitreal Injection, Pharmacologic Agent - OS - Left Eye    Bevacizumab  (AVASTIN) SOLN 1.25 mg    aflibercept (EYLEA) SOLN 2 mg    2. Long term (current) use of oral hypoglycemic drugs  Z79.84     3. Long-term (current) use of injectable non-insulin antidiabetic drugs  Z79.85     4. Current use of insulin (HCC)  Z79.4     5. Essential hypertension  I10     6. Hypertensive retinopathy of both eyes  H35.033     7. Combined forms of age-related cataract of both eyes  H25.813      1-4. Proliferative diabetic retinopathy OU - previously managed by Dr. Cathey Endow -- lost to f/u since 2020 - s/p PRP OU and IVA OS x7 (BB) - s/p IVA OD #1 (01.02.24), #2 (02.02.24), #3 (03.01.24), #4(04.05.24) -- IVA resistance - s/p IVE OD #1 (05.03.24) - s/p IVA OS #1 (01.04.24), #2 (02.02.24), #3 (03.01.24), #4(04.05.24), (05.03.24) - s/p PRP fill in OS (01.23.24) - exam shows scattered MA OU; laser PRP OU -  FA (01.02.23) shows OD: Cluster of leakage temporal macula, no NV; OS: Scattered focal NVE greatest inferior and temporal quads -- will need PRP fill in OS -- completed Jan 2024 - OCT shows OD: Interval improvement in IRF/cystic changes centrally -- essentially resolved; OS: Interval improvement in cystic changes temporal macula at 4 weeks - recommend IVE OD #2 and IVA OS #6 today, 06.03.24 w/ f/u at 4 wks - pt wishes to proceed with injections - RBA of procedure discussed, questions answered - see procedure note - IVE informed consent obtained and signed, 05.03.24 (OU) - IVA informed consent obtained and signed, 01.02.24 (OU) - f/u 4 weeks -- DFE, OCT, possible injxns  5,6. Hypertensive retinopathy OU - discussed importance of tight BP control - monitor  7. Mixed Cataract OU - The symptoms of cataract, surgical options, and treatments and risks were discussed with patient. - discussed diagnosis and progression - monitor  Ophthalmic Meds Ordered this visit:  Meds ordered this encounter  Medications   Bevacizumab (AVASTIN) SOLN 1.25 mg   aflibercept (EYLEA) SOLN 2  mg     Return in about 4 weeks (around 01/29/2023) for f/u PDR OU, DFE, OCT.  There are no Patient Instructions on file for this visit.  This document serves as a record of services personally performed by Karie Chimera, MD, PhD. It was created on their behalf by Annalee Genta, COMT. The creation of this record is the provider's dictation and/or activities during the visit.  Electronically signed by: Annalee Genta, COMT 01/01/23 12:23 PM  This document serves as a record of services personally performed by Karie Chimera, MD, PhD. It was created on their behalf by Glee Arvin. Manson Passey, OA an ophthalmic technician. The creation of this record is the provider's dictation and/or activities during the visit.    Electronically signed by: Glee Arvin. Kristopher Oppenheim 06.03.2024 12:23 PM  Karie Chimera, M.D., Ph.D. Diseases & Surgery of the Retina and Vitreous [facility] 12/01/2022   I have reviewed the above documentation for accuracy and completeness, and I agree with the above. Karie Chimera, M.D., Ph.D. 01/01/23 12:24 PM  Abbreviations: M myopia (nearsighted); A astigmatism; H hyperopia (farsighted); P presbyopia; Mrx spectacle prescription;  CTL contact lenses; OD right eye; OS left eye; OU both eyes  XT exotropia; ET esotropia; PEK punctate epithelial keratitis; PEE punctate epithelial erosions; DES dry eye syndrome; MGD meibomian gland dysfunction; ATs artificial tears; PFAT's preservative free artificial tears; NSC nuclear sclerotic cataract; PSC posterior subcapsular cataract; ERM epi-retinal membrane; PVD posterior vitreous detachment; RD retinal detachment; DM diabetes mellitus; DR diabetic retinopathy; NPDR non-proliferative diabetic retinopathy; PDR proliferative diabetic retinopathy; CSME clinically significant macular edema; DME diabetic macular edema; dbh dot blot hemorrhages; CWS cotton wool spot; POAG primary open angle glaucoma; C/D cup-to-disc ratio; HVF humphrey  visual field; GVF goldmann visual field; OCT optical coherence tomography; IOP intraocular pressure; BRVO Branch retinal vein occlusion; CRVO central retinal vein occlusion; CRAO central retinal artery occlusion; BRAO branch retinal artery occlusion; RT retinal tear; SB scleral buckle; PPV pars plana vitrectomy; VH Vitreous hemorrhage; PRP panretinal laser photocoagulation; IVK intravitreal kenalog; VMT vitreomacular traction; MH Macular hole;  NVD neovascularization of the disc; NVE neovascularization elsewhere; AREDS age related eye disease study; ARMD age related macular degeneration; POAG primary open angle glaucoma; EBMD epithelial/anterior basement membrane dystrophy; ACIOL anterior chamber intraocular lens; IOL intraocular lens; PCIOL posterior chamber intraocular lens; Phaco/IOL phacoemulsification with intraocular lens placement; PRK photorefractive keratectomy; LASIK laser assisted  in situ keratomileusis; HTN hypertension; DM diabetes mellitus; COPD chronic obstructive pulmonary disease

## 2022-12-28 NOTE — Patient Instructions (Signed)
We are going to try to arrange for her to have a CT of your abdomen and pelvis in [facility] tomorrow; however, if there is no availability, with me have to schedule you somewhere else.  Please have blood work completed at [facility] today.  If you have any worsening symptoms between now and the time of your CT scan, please proceed to the emergency room.  I will have further recommendations for you pending her CT and lab results.  Ermalinda Memos, PA-C [facility]

## 2022-12-29 ENCOUNTER — Other Ambulatory Visit: Payer: Self-pay | Admitting: Gastroenterology

## 2022-12-29 ENCOUNTER — Telehealth: Payer: Self-pay | Admitting: *Deleted

## 2022-12-29 ENCOUNTER — Ambulatory Visit
Admission: RE | Admit: 2022-12-29 | Discharge: 2022-12-29 | Disposition: A | Discharge status: Home / Self Care | Payer: Medicare Other | Source: Ambulatory Visit | Attending: Gastroenterology | Admitting: Gastroenterology

## 2022-12-29 DIAGNOSIS — I700 Atherosclerosis of aorta: Secondary | ICD-10-CM | POA: Diagnosis not present

## 2022-12-29 DIAGNOSIS — R110 Nausea: Secondary | ICD-10-CM | POA: Insufficient documentation

## 2022-12-29 DIAGNOSIS — R1084 Generalized abdominal pain: Secondary | ICD-10-CM | POA: Diagnosis not present

## 2022-12-29 DIAGNOSIS — K5904 Chronic idiopathic constipation: Secondary | ICD-10-CM

## 2022-12-29 DIAGNOSIS — R109 Unspecified abdominal pain: Secondary | ICD-10-CM | POA: Diagnosis not present

## 2022-12-29 LAB — POCT I-STAT CREATININE: Creatinine, Ser: 1.3 mg/dL — ABNORMAL HIGH (ref 0.44–1.00)

## 2022-12-29 MED ORDER — IOHEXOL 300 MG/ML  SOLN
80.0000 mL | Freq: Once | INTRAMUSCULAR | Status: AC | PRN
  Administered 2022-12-29: 80 mL via INTRAVENOUS

## 2022-12-29 MED ORDER — IOHEXOL 300 MG/ML  SOLN: 100.0000 mL | Freq: Once | INTRAMUSCULAR | Status: DC | PRN

## 2022-12-29 MED ORDER — LINACLOTIDE 290 MCG PO CAPS: 290.0000 ug | ORAL_CAPSULE | Freq: Every day | ORAL | 5 refills | Status: DC

## 2022-12-29 MED ORDER — IOHEXOL 9 MG/ML PO SOLN
500.0000 mL | ORAL | Status: AC
  Administered 2022-12-29 (×2): 500 mL via ORAL

## 2022-12-29 NOTE — Telephone Encounter (Signed)
Noted! Thank you

## 2022-12-29 NOTE — Telephone Encounter (Signed)
Called pt. She never heard from [facility] after being told they would call patient to get her in today. Patient stated she is willing to go anywhere as she is not feeling any better and does not want to go to ED. I called [facility] and was told [facility] still down today.   Patient is going to [facility]patient [facility] in [place] now. FYI

## 2023-01-01 ENCOUNTER — Encounter (INDEPENDENT_AMBULATORY_CARE_PROVIDER_SITE_OTHER): Payer: Self-pay | Admitting: Ophthalmology

## 2023-01-01 ENCOUNTER — Ambulatory Visit (INDEPENDENT_AMBULATORY_CARE_PROVIDER_SITE_OTHER): Payer: Medicare Other | Admitting: Ophthalmology

## 2023-01-01 DIAGNOSIS — H35033 Hypertensive retinopathy, bilateral: Secondary | ICD-10-CM | POA: Diagnosis not present

## 2023-01-01 DIAGNOSIS — Z7985 Long-term (current) use of injectable non-insulin antidiabetic drugs: Secondary | ICD-10-CM | POA: Diagnosis not present

## 2023-01-01 DIAGNOSIS — I10 Essential (primary) hypertension: Secondary | ICD-10-CM

## 2023-01-01 DIAGNOSIS — H25813 Combined forms of age-related cataract, bilateral: Secondary | ICD-10-CM | POA: Diagnosis not present

## 2023-01-01 DIAGNOSIS — E113513 Type 2 diabetes mellitus with proliferative diabetic retinopathy with macular edema, bilateral: Secondary | ICD-10-CM | POA: Diagnosis not present

## 2023-01-01 DIAGNOSIS — Z7984 Long term (current) use of oral hypoglycemic drugs: Secondary | ICD-10-CM | POA: Diagnosis not present

## 2023-01-01 DIAGNOSIS — Z794 Long term (current) use of insulin: Secondary | ICD-10-CM | POA: Diagnosis not present

## 2023-01-01 MED ORDER — AFLIBERCEPT 2MG/0.05ML IZ SOLN FOR KALEIDOSCOPE
2.0000 mg | INTRAVITREAL | Status: AC | PRN
  Administered 2023-01-01: 2 mg via INTRAVITREAL

## 2023-01-01 MED ORDER — BEVACIZUMAB CHEMO INJECTION 1.25MG/0.05ML SYRINGE FOR KALEIDOSCOPE
1.2500 mg | INTRAVITREAL | Status: AC | PRN
  Administered 2023-01-01: 1.25 mg via INTRAVITREAL

## 2023-01-02 ENCOUNTER — Other Ambulatory Visit: Payer: Self-pay | Admitting: Gastroenterology

## 2023-01-02 DIAGNOSIS — K5904 Chronic idiopathic constipation: Secondary | ICD-10-CM

## 2023-01-02 MED ORDER — LINACLOTIDE 290 MCG PO CAPS: 290.0000 ug | ORAL_CAPSULE | Freq: Every day | ORAL | 5 refills | Status: DC

## 2023-01-03 ENCOUNTER — Ambulatory Visit (INDEPENDENT_AMBULATORY_CARE_PROVIDER_SITE_OTHER): Payer: Medicare Other

## 2023-01-03 ENCOUNTER — Telehealth: Payer: Self-pay | Admitting: *Deleted

## 2023-01-03 ENCOUNTER — Encounter: Payer: Self-pay | Admitting: *Deleted

## 2023-01-03 DIAGNOSIS — J309 Allergic rhinitis, unspecified: Secondary | ICD-10-CM

## 2023-01-03 NOTE — Telephone Encounter (Signed)
Received approval letter for Linzess . Sent copy to scan center.

## 2023-01-03 NOTE — Telephone Encounter (Signed)
Great!

## 2023-01-03 NOTE — Telephone Encounter (Signed)
Toni Amend, have you received PA request for Linzess? If so, can you update patient on where we are in this process?

## 2023-01-10 ENCOUNTER — Ambulatory Visit (INDEPENDENT_AMBULATORY_CARE_PROVIDER_SITE_OTHER): Payer: Medicare Other

## 2023-01-10 DIAGNOSIS — J309 Allergic rhinitis, unspecified: Secondary | ICD-10-CM | POA: Diagnosis not present

## 2023-01-17 ENCOUNTER — Other Ambulatory Visit (HOSPITAL_COMMUNITY)
Admission: RE | Admit: 2023-01-17 | Discharge: 2023-01-17 | Disposition: A | Discharge status: Home / Self Care | Payer: Medicare Other | Source: Ambulatory Visit | Attending: Nephrology | Admitting: Nephrology

## 2023-01-17 ENCOUNTER — Ambulatory Visit (INDEPENDENT_AMBULATORY_CARE_PROVIDER_SITE_OTHER): Payer: Medicare Other

## 2023-01-17 DIAGNOSIS — R809 Proteinuria, unspecified: Secondary | ICD-10-CM | POA: Diagnosis not present

## 2023-01-17 DIAGNOSIS — N1831 Chronic kidney disease, stage 3a: Secondary | ICD-10-CM | POA: Diagnosis not present

## 2023-01-17 DIAGNOSIS — I5032 Chronic diastolic (congestive) heart failure: Secondary | ICD-10-CM | POA: Diagnosis not present

## 2023-01-17 DIAGNOSIS — Z79899 Other long term (current) drug therapy: Secondary | ICD-10-CM | POA: Insufficient documentation

## 2023-01-17 DIAGNOSIS — N2581 Secondary hyperparathyroidism of renal origin: Secondary | ICD-10-CM | POA: Diagnosis not present

## 2023-01-17 DIAGNOSIS — J309 Allergic rhinitis, unspecified: Secondary | ICD-10-CM | POA: Diagnosis not present

## 2023-01-17 DIAGNOSIS — E1029 Type 1 diabetes mellitus with other diabetic kidney complication: Secondary | ICD-10-CM | POA: Diagnosis not present

## 2023-01-17 LAB — CBC WITH DIFFERENTIAL/PLATELET
Abs Immature Granulocytes: 0.01 10*3/uL (ref 0.00–0.07)
Basophils Absolute: 0.1 10*3/uL (ref 0.0–0.1)
Basophils Relative: 1 %
Eosinophils Absolute: 0.2 10*3/uL (ref 0.0–0.5)
Eosinophils Relative: 4 %
HCT: 39.8 % (ref 36.0–46.0)
Hemoglobin: 12.7 g/dL (ref 12.0–15.0)
Immature Granulocytes: 0 %
Lymphocytes Relative: 26 %
Lymphs Abs: 1.2 10*3/uL (ref 0.7–4.0)
MCH: 27.6 pg (ref 26.0–34.0)
MCHC: 31.9 g/dL (ref 30.0–36.0)
MCV: 86.5 fL (ref 80.0–100.0)
Monocytes Absolute: 0.5 10*3/uL (ref 0.1–1.0)
Monocytes Relative: 10 %
Neutro Abs: 2.8 10*3/uL (ref 1.7–7.7)
Neutrophils Relative %: 59 %
Platelets: 233 10*3/uL (ref 150–400)
RBC: 4.6 MIL/uL (ref 3.87–5.11)
RDW: 16.1 % — ABNORMAL HIGH (ref 11.5–15.5)
WBC: 4.7 10*3/uL (ref 4.0–10.5)
nRBC: 0 % (ref 0.0–0.2)

## 2023-01-17 LAB — RENAL FUNCTION PANEL
Albumin: 3.5 g/dL (ref 3.5–5.0)
Anion gap: 9 (ref 5–15)
BUN: 28 mg/dL — ABNORMAL HIGH (ref 8–23)
CO2: 31 mmol/L (ref 22–32)
Calcium: 9.1 mg/dL (ref 8.9–10.3)
Chloride: 98 mmol/L (ref 98–111)
Creatinine, Ser: 1.17 mg/dL — ABNORMAL HIGH (ref 0.44–1.00)
GFR, Estimated: 52 mL/min — ABNORMAL LOW (ref >60)
Glucose, Bld: 96 mg/dL (ref 70–99)
Phosphorus: 3.5 mg/dL (ref 2.5–4.6)
Potassium: 4.2 mmol/L (ref 3.5–5.1)
Sodium: 138 mmol/L (ref 135–145)

## 2023-01-17 LAB — PROTEIN / CREATININE RATIO, URINE
Creatinine, Urine: 113 mg/dL
Protein Creatinine Ratio: 0.08 mg/mg{Cre} (ref 0.00–0.15)
Total Protein, Urine: 9 mg/dL

## 2023-01-17 LAB — MAGNESIUM: Magnesium: 2.2 mg/dL (ref 1.7–2.4)

## 2023-01-19 LAB — PTH, INTACT AND CALCIUM
Calcium, Total (PTH): 9.7 mg/dL (ref 8.7–10.3)
PTH: 86 pg/mL — ABNORMAL HIGH (ref 15–65)

## 2023-01-22 DIAGNOSIS — E113313 Type 2 diabetes mellitus with moderate nonproliferative diabetic retinopathy with macular edema, bilateral: Secondary | ICD-10-CM | POA: Diagnosis not present

## 2023-01-22 DIAGNOSIS — H25013 Cortical age-related cataract, bilateral: Secondary | ICD-10-CM | POA: Diagnosis not present

## 2023-01-22 DIAGNOSIS — H35361 Drusen (degenerative) of macula, right eye: Secondary | ICD-10-CM | POA: Diagnosis not present

## 2023-01-22 DIAGNOSIS — H524 Presbyopia: Secondary | ICD-10-CM | POA: Diagnosis not present

## 2023-01-22 DIAGNOSIS — H2513 Age-related nuclear cataract, bilateral: Secondary | ICD-10-CM | POA: Diagnosis not present

## 2023-01-22 LAB — HM DIABETES EYE EXAM

## 2023-01-22 NOTE — Progress Notes (Signed)
[facility] - Clinic Note  01/29/2023     CHIEF COMPLAINT Patient presents for Retina Follow Up   HISTORY OF PRESENT ILLNESS: Jodi Ray is a 63 y.o. female who presents to the clinic today for:   HPI     Retina Follow Up   Patient presents with  Diabetic Retinopathy.  In both eyes.  This started 4 weeks ago.  Duration of 4 weeks.  Since onset it is stable.  I, the attending physician,  performed the HPI with the patient and updated documentation appropriately.        Comments   4 week retina follow up PDR OU and I'VE OD and IVA OS pt is reporting no vision changes noticed she has noticed a floaters since her last injection in the left eye she denies any flashes her last reading 62       Last edited by Jodi Chris, MD on 01/29/2023 12:20 PM.    Pt states he saw Dr. Bascom Ray on Monday, she states Jodi Ray thought she did not need cataract sx for a couple more years, pt states she has a tiny floater in her right eye, pt states her blood sugar numbers have been "so incredibly low" the last few times they have been checked  Referring physician: Kirstie Peri, MD [address] [place],  [place] [address]  HISTORICAL INFORMATION:   Selected notes from the MEDICAL RECORD NUMBER Referred by Dr. Bascom Ray for concern of DME LEE:  Ocular Hx- PMH-    CURRENT MEDICATIONS: No current outpatient medications on file. (Ophthalmic Drugs)   No current facility-administered medications for this visit. (Ophthalmic Drugs)   Current Outpatient Medications (Other)  Medication Sig   aspirin EC 81 MG tablet Take 1 tablet (81 mg total) by mouth daily. Swallow whole.   atorvastatin (LIPITOR) 80 MG tablet TAKE 1 TABLET BY MOUTH ONCE DAILY AT  6PM   calcitRIOL (ROCALTROL) 0.25 MCG capsule Take 0.25 mcg by mouth every other day.   carvedilol (COREG) 3.125 MG tablet Take by mouth.   Continuous Blood Gluc Sensor (DEXCOM G6 SENSOR) MISC 1 Device by Other route as directed.    EPINEPHrine 0.3 mg/0.3 mL IJ SOAJ injection Inject 0.3 mg into the muscle as needed for anaphylaxis.   fish oil-omega-3 fatty acids 1000 MG capsule Take 1 g by mouth in the morning and at bedtime.   furosemide (LASIX) 20 MG tablet Take 20 mg by mouth. Take 2 tablets in the morning and 1 tablet in the evening   furosemide (LASIX) 40 MG tablet Take 20-40 mg by mouth 2 (two) times daily. 40 MG AM, 20 MG PM   Insulin Human (INSULIN PUMP) SOLN Inject into the skin continuous.    Insulin Pen Needle (PEN NEEDLES 31GX5/16") 31G X 8 MM MISC    JARDIANCE 10 MG TABS tablet Take 5 mg by mouth every other day.   KERENDIA 10 MG TABS Take 1 tablet by mouth daily.   levothyroxine (SYNTHROID) 112 MCG tablet Take 112 mcg by mouth daily before breakfast.   linaclotide (LINZESS) 290 MCG CAPS capsule Take 1 capsule (290 mcg total) by mouth daily before breakfast.   loratadine (CLARITIN) 10 MG tablet Take 2 tablets (20 mg total) by mouth daily. (Patient not taking: Reported on 12/28/2022)   metFORMIN (GLUCOPHAGE) 500 MG tablet Take 1 tablet (500 mg total) by mouth 2 (two) times daily with a meal.   nitroGLYCERIN (NITROSTAT) 0.4 MG SL tablet Place 1 tablet (0.4  mg total) under the tongue every 5 (five) minutes as needed for chest pain.   NOVOLOG 100 UNIT/ML injection Inject into the skin continuous. In insulin pump   pantoprazole (PROTONIX) 40 MG tablet Take 1 tablet by mouth once daily   triamcinolone ointment (KENALOG) 0.1 % Apply 1 Application topically 2 (two) times daily.   No current facility-administered medications for this visit. (Other)   REVIEW OF SYSTEMS: ROS   Positive for: Endocrine, Cardiovascular, Eyes Negative for: Constitutional, Gastrointestinal, Neurological, Skin, Genitourinary, Musculoskeletal, HENT, Respiratory, Psychiatric, Allergic/Imm, Heme/Lymph Last edited by Jodi Ray, COT on 01/29/2023  8:41 AM.      ALLERGIES Allergies  Allergen Reactions   Other Nausea And Vomiting  and Other (See Comments)    Opiates Alters mental status diarrhea   Fentanyl Nausea And Vomiting and Other (See Comments)    Nausea, vomiting, loss of consciousness, requiring reversal   PAST MEDICAL HISTORY Past Medical History:  Diagnosis Date   Adult RDS (HCC)    Anemia    Brain aneurysm    frontal lobe   CAD (coronary artery disease)    a. 02/2018: s/p DES to Proximal LAD with residual 20% RCA stenosis.   Chronic pain    Diabetes mellitus without complication (HCC)    Headache    History of left bundle branch block (LBBB)    HTN (hypertension)    Hx of cardiovascular stress test 10/2016   intermediate risk study   Hypothyroidism    Neuromuscular disorder (HCC)    Neuropathy    Obesity    OSA on CPAP 03/10/2015   Renal insufficiency    Retinopathy    Sleep apnea    Varicose veins of both lower extremities    Past Surgical History:  Procedure Laterality Date   BIOPSY  11/14/2016   Procedure: BIOPSY;  Surgeon: West Bali, MD;  Location: [facility];  Service: Endoscopy;;  colon gastric duodenal   BREAST EXCISIONAL BIOPSY Right    BREAST SURGERY Left 1994   Lumpectomy   COLONOSCOPY WITH PROPOFOL N/A 11/14/2016   Dr. Darrick Penna: Moderately redundant rectosigmoid colon. Random colon biopsies benign. Internal hemorrhoids. Surveillance colonoscopy in 5 years.   COLONOSCOPY WITH PROPOFOL N/A 04/20/2022   Procedure: COLONOSCOPY WITH PROPOFOL;  Surgeon: Lanelle Bal, DO;  Location: [facility];  Service: Endoscopy;  Laterality: N/A;  12:30am   CORONARY STENT INTERVENTION N/A 03/22/2018   Procedure: CORONARY STENT INTERVENTION;  Surgeon: Kathleene Hazel, MD;  Location: [facility];  Service: Cardiovascular;  Laterality: N/A;   ESOPHAGOGASTRODUODENOSCOPY (EGD) WITH PROPOFOL N/A 11/14/2016   Dr. Darrick Penna: Esophagus normal. Moderate gastritis, few gastric polyps. Duodenal biopsies negative. Fundic gland polyp gastric polyp area no H pylori.   FOOT SURGERY      LEFT HEART CATH AND CORONARY ANGIOGRAPHY N/A 03/22/2018   Procedure: LEFT HEART CATH AND CORONARY ANGIOGRAPHY;  Surgeon: Kathleene Hazel, MD;  Location: [facility];  Service: Cardiovascular;  Laterality: N/A;   LEG SURGERY Right    POLYPECTOMY  04/20/2022   Procedure: POLYPECTOMY;  Surgeon: Lanelle Bal, DO;  Location: [facility];  Service: Endoscopy;;   REPLACEMENT TOTAL KNEE Right    SPINAL CORD STIMULATOR IMPLANT     FAMILY HISTORY Family History  Problem Relation Age of Onset   Cancer Mother 10       colon   Cancer Father        renal cell   Colon cancer Father  Breast cancer Sister    Cancer Sister        primary brain, and primary breast too   Breast cancer Sister    Cancer Sister        breast    Breast cancer Sister    Breast cancer Sister    Cancer Brother        kidney and liver   Sleep apnea Neg Hx    SOCIAL HISTORY Social History   Tobacco Use   Smoking status: Never   Smokeless tobacco: Never  Vaping Use   Vaping Use: Never used  Substance Use Topics   Alcohol use: No    Alcohol/week: 0.0 standard drinks of alcohol   Drug use: No       OPHTHALMIC EXAM:  Base Eye Exam     Visual Acuity (Snellen - Linear)       Right Left   Dist cc 20/25 -2 20/20 -3   Dist ph cc NI 20/20 -2         Tonometry (Tonopen, 8:49 AM)       Right Left   Pressure 12 15         Pupils       Pupils Dark Light Shape React APD   Right PERRL 3 2 Round Brisk None   Left PERRL 3 2 Round Brisk None         Visual Fields       Left Right    Full Full         Extraocular Movement       Right Left    Full, Ortho Full, Ortho         Neuro/Psych     Oriented x3: Yes   Mood/Affect: Normal         Dilation     Both eyes: 2.5% Phenylephrine @ 8:49 AM           Slit Lamp and Fundus Exam     Slit Lamp Exam       Right Left   Lids/Lashes Dermatochalasis - upper lid, mild MGD Dermatochalasis - upper lid, mild MGD    Conjunctiva/Sclera nasal pingeucula nasal pingeucula, Trace Injection   Cornea arcus, mild tear film debris arcus, mild tear film debris   Anterior Chamber deep and clear, no cell or flare deep and clear, no cell or flare   Iris Round and dilated, No NVI Round and dilated, No NVI   Lens 2+ Nuclear sclerosis, 2-3+ Cortical cataract, 1+ Posterior subcapsular cataract 2+ Nuclear sclerosis, 2-3+ Cortical cataract, 1+ Posterior subcapsular cataract   Anterior Vitreous Vitreous syneresis Vitreous syneresis         Fundus Exam       Right Left   Disc Pink and Sharp, no NVD, temporal PPA Pink and Sharp, no NVD, temporal PPP   C/D Ratio 0.4 0.4   Macula Good foveal reflex, +cystic changes temporal macula / non-central, scattered MA -- improved Flat, good foveal reflex, scattered MA -- improved, trace cystic changes   Vessels attenuated, mild tortuosity attenuated, mild tortuosity, +NV   Periphery Attached, dense 360 PRP extending posteriorly to arcades Attached, 360 PRP with good posterior fill in, room for fill in if needed           Refraction     Wearing Rx       Sphere Cylinder Axis Add   Right +1.75 +1.50 124 +2.50   Left +4.25 +2.25 001 +2.50  IMAGING AND PROCEDURES  Imaging and Procedures for 01/29/2023  OCT, Retina - OU - Both Eyes       Right Eye Quality was good. Central Foveal Thickness: 315. Progression has been stable. Findings include normal foveal contour, no IRF, no SRF (: stable resolution of IRF/cystic changes centrally, non-central cystic changes temporal macula -- caught on widefield).   Left Eye Quality was good. Central Foveal Thickness: 300. Progression has been stable. Findings include normal foveal contour, no SRF, intraretinal fluid (Trace persistent cystic changes temporal macula ).   Notes *Images captured and stored on drive  Diagnosis / Impression:  +DME OU OD: stable resolution of IRF/cystic changes centrally, non-central cystic  changes temporal macula -- caught on widefield OS: Trace persistent cystic changes temporal macula   Clinical management:  See below  Abbreviations: NFP - Normal foveal profile. CME - cystoid macular edema. PED - pigment epithelial detachment. IRF - intraretinal fluid. SRF - subretinal fluid. EZ - ellipsoid zone. ERM - epiretinal membrane. ORA - outer retinal atrophy. ORT - outer retinal tubulation. SRHM - subretinal hyper-reflective material. IRHM - intraretinal hyper-reflective material      Intravitreal Injection, Pharmacologic Agent - OD - Right Eye       Time Out 01/29/2023. 9:01 AM. Confirmed correct patient, procedure, site, and patient consented.   Anesthesia Topical anesthesia was used. Anesthetic medications included Lidocaine 2%, Proparacaine 0.5%.   Procedure Preparation included 5% betadine to ocular surface, eyelid speculum. A (32g) needle was used.   Injection: 2 mg aflibercept 2 MG/0.05ML   Route: Intravitreal, Site: Right Eye   NDC: L6038910, Lot: [id number], Expiration date: 12/29/2023, Waste: 0 mL   Post-op Post injection exam found visual acuity of at least counting fingers. The patient tolerated the procedure well. There were no complications. The patient received written and verbal post procedure care education.      Intravitreal Injection, Pharmacologic Agent - OS - Left Eye       Time Out 01/29/2023. 9:01 AM. Confirmed correct patient, procedure, site, and patient consented.   Anesthesia Topical anesthesia was used. Anesthetic medications included Lidocaine 2%, Proparacaine 0.5%.   Procedure Preparation included 5% betadine to ocular surface, eyelid speculum. A (32g) needle was used.   Injection: 1.25 mg Bevacizumab 1.25mg /0.81ml   Route: Intravitreal, Site: Left Eye   NDC: P3213405, Lot: [id number], Expiration date: 04/27/2023   Post-op Post injection exam found visual acuity of at least counting fingers. The patient tolerated the procedure  well. There were no complications. The patient received written and verbal post procedure care education. Post injection medications were not given.            ASSESSMENT/PLAN:    ICD-10-CM   1. Proliferative diabetic retinopathy of both eyes with macular edema associated with type 2 diabetes mellitus (HCC)  E11.3513 OCT, Retina - OU - Both Eyes    Intravitreal Injection, Pharmacologic Agent - OD - Right Eye    Intravitreal Injection, Pharmacologic Agent - OS - Left Eye    Bevacizumab (AVASTIN) SOLN 1.25 mg    aflibercept (EYLEA) SOLN 2 mg    2. Long term (current) use of oral hypoglycemic drugs  Z79.84     3. Long-term (current) use of injectable non-insulin antidiabetic drugs  Z79.85     4. Current use of insulin (HCC)  Z79.4     5. Essential hypertension  I10     6. Hypertensive retinopathy of both eyes  H35.033     7. Combined forms of age-related  cataract of both eyes  H25.813      1-4. Proliferative diabetic retinopathy OU - previously managed by Dr. Cathey Endow -- lost to f/u since 2020 - s/p PRP OU and IVA OS x7 (BB) - s/p IVA OD #1 (01.02.24), #2 (02.02.24), #3 (03.01.24), #4(04.05.24) -- IVA resistance - s/p IVE OD #1 (05.03.24), #2 (06.03.24) - s/p IVA OS #1 (01.04.24), #2 (02.02.24), #3 (03.01.24), #4(04.05.24), #5 (05.03.24), #6 (06.03.24) - s/p PRP fill in OS (01.23.24) - exam shows scattered MA OU; laser PRP OU - FA (01.02.23) shows OD: Cluster of leakage temporal macula, no NV; OS: Scattered focal NVE greatest inferior and temporal quads -- will need PRP fill in OS -- completed Jan 2024 - OCT shows OD: stable resolution of IRF/cystic changes centrally, non-central cystic changes temporal macula -- caught on widefield; OS: Trace persistent cystic changes temporal macula at 4 weeks - recommend IVE OD #3 and IVA OS #7 today, 07.01.24 w/ f/u extended to 5 wks - pt wishes to proceed with injections - RBA of procedure discussed, questions answered - see procedure note -  IVE informed consent obtained and signed, 05.03.24 (OU) - IVA informed consent obtained and signed, 01.02.24 (OU) - f/u 5 weeks -- DFE, OCT, possible injxns  5,6. Hypertensive retinopathy OU - discussed importance of tight BP control - monitor  7. Mixed Cataract OU - The symptoms of cataract, surgical options, and treatments and risks were discussed with patient. - discussed diagnosis and progression - monitor  Ophthalmic Meds Ordered this visit:  Meds ordered this encounter  Medications   Bevacizumab (AVASTIN) SOLN 1.25 mg   aflibercept (EYLEA) SOLN 2 mg     Return in about 5 weeks (around 03/05/2023) for f/u PDR OU, DFE, OCT.  There are no Patient Instructions on file for this visit.  This document serves as a record of services personally performed by Karie Chimera, MD, PhD. It was created on their behalf by Glee Arvin. Manson Passey, OA an ophthalmic technician. The creation of this record is the provider's dictation and/or activities during the visit.    Electronically signed by: Glee Arvin. Manson Passey, [place] 06.24.2024 12:21 PM  Karie Chimera, M.D., Ph.D. Diseases & Surgery of the Retina and Vitreous [facility] 01/29/2023   I have reviewed the above documentation for accuracy and completeness, and I agree with the above. Karie Chimera, M.D., Ph.D. 01/29/23 12:22 PM   Abbreviations: M myopia (nearsighted); A astigmatism; H hyperopia (farsighted); P presbyopia; Mrx spectacle prescription;  CTL contact lenses; OD right eye; OS left eye; OU both eyes  XT exotropia; ET esotropia; PEK punctate epithelial keratitis; PEE punctate epithelial erosions; DES dry eye syndrome; MGD meibomian gland dysfunction; ATs artificial tears; PFAT's preservative free artificial tears; NSC nuclear sclerotic cataract; PSC posterior subcapsular cataract; ERM epi-retinal membrane; PVD posterior vitreous detachment; RD retinal detachment; DM diabetes mellitus; DR diabetic retinopathy; NPDR  non-proliferative diabetic retinopathy; PDR proliferative diabetic retinopathy; CSME clinically significant macular edema; DME diabetic macular edema; dbh dot blot hemorrhages; CWS cotton wool spot; POAG primary open angle glaucoma; C/D cup-to-disc ratio; HVF humphrey visual field; GVF goldmann visual field; OCT optical coherence tomography; IOP intraocular pressure; BRVO Branch retinal vein occlusion; CRVO central retinal vein occlusion; CRAO central retinal artery occlusion; BRAO branch retinal artery occlusion; RT retinal tear; SB scleral buckle; PPV pars plana vitrectomy; VH Vitreous hemorrhage; PRP panretinal laser photocoagulation; IVK intravitreal kenalog; VMT vitreomacular traction; MH Macular hole;  NVD neovascularization of the disc; NVE neovascularization  elsewhere; AREDS age related eye disease study; ARMD age related macular degeneration; POAG primary open angle glaucoma; EBMD epithelial/anterior basement membrane dystrophy; ACIOL anterior chamber intraocular lens; IOL intraocular lens; PCIOL posterior chamber intraocular lens; Phaco/IOL phacoemulsification with intraocular lens placement; PRK photorefractive keratectomy; LASIK laser assisted in situ keratomileusis; HTN hypertension; DM diabetes mellitus; COPD chronic obstructive pulmonary disease 

## 2023-01-23 DIAGNOSIS — I5032 Chronic diastolic (congestive) heart failure: Secondary | ICD-10-CM | POA: Diagnosis not present

## 2023-01-23 DIAGNOSIS — I129 Hypertensive chronic kidney disease with stage 1 through stage 4 chronic kidney disease, or unspecified chronic kidney disease: Secondary | ICD-10-CM | POA: Diagnosis not present

## 2023-01-23 DIAGNOSIS — E1122 Type 2 diabetes mellitus with diabetic chronic kidney disease: Secondary | ICD-10-CM | POA: Diagnosis not present

## 2023-01-23 DIAGNOSIS — N2581 Secondary hyperparathyroidism of renal origin: Secondary | ICD-10-CM | POA: Diagnosis not present

## 2023-01-24 ENCOUNTER — Ambulatory Visit (INDEPENDENT_AMBULATORY_CARE_PROVIDER_SITE_OTHER): Payer: Medicare Other

## 2023-01-24 DIAGNOSIS — J309 Allergic rhinitis, unspecified: Secondary | ICD-10-CM

## 2023-01-29 ENCOUNTER — Ambulatory Visit (INDEPENDENT_AMBULATORY_CARE_PROVIDER_SITE_OTHER): Payer: Medicare Other | Admitting: Ophthalmology

## 2023-01-29 ENCOUNTER — Encounter (INDEPENDENT_AMBULATORY_CARE_PROVIDER_SITE_OTHER): Payer: Self-pay | Admitting: Ophthalmology

## 2023-01-29 DIAGNOSIS — Z7984 Long term (current) use of oral hypoglycemic drugs: Secondary | ICD-10-CM

## 2023-01-29 DIAGNOSIS — I10 Essential (primary) hypertension: Secondary | ICD-10-CM | POA: Diagnosis not present

## 2023-01-29 DIAGNOSIS — H35033 Hypertensive retinopathy, bilateral: Secondary | ICD-10-CM

## 2023-01-29 DIAGNOSIS — Z794 Long term (current) use of insulin: Secondary | ICD-10-CM

## 2023-01-29 DIAGNOSIS — H25813 Combined forms of age-related cataract, bilateral: Secondary | ICD-10-CM | POA: Diagnosis not present

## 2023-01-29 DIAGNOSIS — Z7985 Long-term (current) use of injectable non-insulin antidiabetic drugs: Secondary | ICD-10-CM | POA: Diagnosis not present

## 2023-01-29 DIAGNOSIS — E113513 Type 2 diabetes mellitus with proliferative diabetic retinopathy with macular edema, bilateral: Secondary | ICD-10-CM | POA: Diagnosis not present

## 2023-01-29 MED ORDER — BEVACIZUMAB CHEMO INJECTION 1.25MG/0.05ML SYRINGE FOR KALEIDOSCOPE
1.2500 mg | INTRAVITREAL | Status: AC | PRN
  Administered 2023-01-29: 1.25 mg via INTRAVITREAL

## 2023-01-29 MED ORDER — AFLIBERCEPT 2MG/0.05ML IZ SOLN FOR KALEIDOSCOPE
2.0000 mg | INTRAVITREAL | Status: AC | PRN
  Administered 2023-01-29: 2 mg via INTRAVITREAL

## 2023-01-30 DIAGNOSIS — E11319 Type 2 diabetes mellitus with unspecified diabetic retinopathy without macular edema: Secondary | ICD-10-CM | POA: Diagnosis not present

## 2023-01-30 DIAGNOSIS — E039 Hypothyroidism, unspecified: Secondary | ICD-10-CM | POA: Diagnosis not present

## 2023-01-30 DIAGNOSIS — E118 Type 2 diabetes mellitus with unspecified complications: Secondary | ICD-10-CM | POA: Diagnosis not present

## 2023-01-30 DIAGNOSIS — Z9641 Presence of insulin pump (external) (internal): Secondary | ICD-10-CM | POA: Diagnosis not present

## 2023-01-30 DIAGNOSIS — E1140 Type 2 diabetes mellitus with diabetic neuropathy, unspecified: Secondary | ICD-10-CM | POA: Diagnosis not present

## 2023-01-30 DIAGNOSIS — E109 Type 1 diabetes mellitus without complications: Secondary | ICD-10-CM | POA: Diagnosis not present

## 2023-01-30 DIAGNOSIS — Z794 Long term (current) use of insulin: Secondary | ICD-10-CM | POA: Diagnosis not present

## 2023-01-30 DIAGNOSIS — E669 Obesity, unspecified: Secondary | ICD-10-CM | POA: Diagnosis not present

## 2023-01-30 DIAGNOSIS — I2510 Atherosclerotic heart disease of native coronary artery without angina pectoris: Secondary | ICD-10-CM | POA: Diagnosis not present

## 2023-01-30 DIAGNOSIS — E785 Hyperlipidemia, unspecified: Secondary | ICD-10-CM | POA: Diagnosis not present

## 2023-01-30 DIAGNOSIS — M1460 Charcot's joint, unspecified site: Secondary | ICD-10-CM | POA: Diagnosis not present

## 2023-01-30 DIAGNOSIS — I10 Essential (primary) hypertension: Secondary | ICD-10-CM | POA: Diagnosis not present

## 2023-01-30 NOTE — Telephone Encounter (Signed)
Mandy/Susan: Please call this patient ASAP to schedule her an in person office visit.  Courtney,  Please let the patient know we can try a lower dose of Linzess if she would like (145 mcg daily). If she continues with a lot of abdominal cramping, recommend stopping the medication and we can try something different.

## 2023-01-31 ENCOUNTER — Ambulatory Visit (INDEPENDENT_AMBULATORY_CARE_PROVIDER_SITE_OTHER): Payer: Medicare Other

## 2023-01-31 DIAGNOSIS — J309 Allergic rhinitis, unspecified: Secondary | ICD-10-CM | POA: Diagnosis not present

## 2023-02-06 DIAGNOSIS — E1151 Type 2 diabetes mellitus with diabetic peripheral angiopathy without gangrene: Secondary | ICD-10-CM | POA: Diagnosis not present

## 2023-02-06 DIAGNOSIS — E1140 Type 2 diabetes mellitus with diabetic neuropathy, unspecified: Secondary | ICD-10-CM | POA: Diagnosis not present

## 2023-02-07 ENCOUNTER — Ambulatory Visit (INDEPENDENT_AMBULATORY_CARE_PROVIDER_SITE_OTHER): Payer: Medicare Other

## 2023-02-07 DIAGNOSIS — J309 Allergic rhinitis, unspecified: Secondary | ICD-10-CM

## 2023-02-07 NOTE — Progress Notes (Unsigned)
Referring Provider: Kirstie Peri, MD Primary Care Physician:  Kirstie Peri, MD Primary GI Physician: Dr. Marletta Lor  No chief complaint on file.   HPI:   Jodi Ray is a 62 y.o. female with a history of anemia, brain aneurysm, CAD s/p stent in 2019, diabetes, chronic pain, HTN, hypothyroidism, OSA on CPAP, and CKD, adenomatous colon polyps due for surveillance in 2028, hemorrhoids, constipation, chronic upper abdominal pain on daily PPI, presenting today for follow-up of constipation***   Last seen in the office 12/28/2022.  She had been struggling with persistent constipation since December 2023.  She was having a bowel movement once every 3 weeks with straining, hard stools.  She had hemorrhoid flares.  She had tried prunes, Metamucil, senna twice daily, Ex-Lax without improvement.  She was drinking 96's of water daily, exercising regularly.  Stated her stomach was constantly upset, nausea after eating without vomiting.  Some periumbilical abdominal pain after eating.  Stated the symptoms started after constipation developed.  On exam, she had hypoactive bowel sounds, severe tenderness to palpation in the epigastric, LUQ, LLQ, suprapubic regions.  Recommended updating labs, CT for further evaluation.  CT did not show any acute abnormalities.  Suspect that her pain was secondary to constipation.  Recommended MiraLAX bowel prep, then starting Linzess 290 mcg daily.  Labs are ordered but not completed.  Patient sent message on 6/13 reporting she started Linzess on 6/9 and had developed pains in her stomach with bowel movements every 3-4 hours, nauseous.  Recommended she could reduce Linzess to every other day or decrease the dose.  Patient sent another message back on 7/2 stating endocrinology wanted [place] to test her for gastroparesis.  Also stated Linzess was not helping, having a bowel movement once a week with taking Linzess every other day.  Still with nausea and abdominal pain.  Advised  that we could try decreasing Linzess to 145 mcg and take it daily, otherwise needed office visit for further evaluation and to discuss need for gastroparesis testing.   Today:     NSAIDs:    Prior GI evaluation: Last EGD April 2018 with moderate gastritis and benign gastric polyp. Also with normal duodenal biopsies.    Colonoscopy in 2018 with evidence of internal hemorrhoids, redundant left colon, and negative random colon biopsies for microscopic colitis.    Colonoscopy completed 04/20/2022 with nonbleeding internal hemorrhoids, 3 mm cecal polyp removed, otherwise normal exam. Recommended 5-year surveillance due to family history of colon cancer. Pathology with tubular adenoma.   HIDA with normal gallbladder EF in 2018, but patient reported pain after Ensure ingestion.   Abdominal ultrasound in 2018 with hepatic steatosis, mildly distended gallbladder, sonographic Murphy sign.   CT A/P with contrast in 2018 with fairly large amount of stool in the colon, small hiatal hernia, minimal ventral hernia containing fat only.   Past Medical History:  Diagnosis Date   Adult RDS (HCC)    Anemia    Brain aneurysm    frontal lobe   CAD (coronary artery disease)    a. 02/2018: s/p DES to Proximal LAD with residual 20% RCA stenosis.   Chronic pain    Diabetes mellitus without complication (HCC)    Headache    History of left bundle branch block (LBBB)    HTN (hypertension)    Hx of cardiovascular stress test 10/2016   intermediate risk study   Hypothyroidism    Neuromuscular disorder (HCC)    Neuropathy    Obesity  OSA on CPAP 03/10/2015   Renal insufficiency    Retinopathy    Sleep apnea    Varicose veins of both lower extremities     Past Surgical History:  Procedure Laterality Date   BIOPSY  11/14/2016   Procedure: BIOPSY;  Surgeon: West Bali, MD;  Location: [facility];  Service: Endoscopy;;  colon gastric duodenal   BREAST EXCISIONAL BIOPSY Right    BREAST  SURGERY Left 1994   Lumpectomy   COLONOSCOPY WITH PROPOFOL N/A 11/14/2016   Dr. Darrick Penna: Moderately redundant rectosigmoid colon. Random colon biopsies benign. Internal hemorrhoids. Surveillance colonoscopy in 5 years.   COLONOSCOPY WITH PROPOFOL N/A 04/20/2022   Procedure: COLONOSCOPY WITH PROPOFOL;  Surgeon: Lanelle Bal, DO;  Location: [facility];  Service: Endoscopy;  Laterality: N/A;  12:30am   CORONARY STENT INTERVENTION N/A 03/22/2018   Procedure: CORONARY STENT INTERVENTION;  Surgeon: Kathleene Hazel, MD;  Location: [facility];  Service: Cardiovascular;  Laterality: N/A;   ESOPHAGOGASTRODUODENOSCOPY (EGD) WITH PROPOFOL N/A 11/14/2016   Dr. Darrick Penna: Esophagus normal. Moderate gastritis, few gastric polyps. Duodenal biopsies negative. Fundic gland polyp gastric polyp area no H pylori.   FOOT SURGERY     LEFT HEART CATH AND CORONARY ANGIOGRAPHY N/A 03/22/2018   Procedure: LEFT HEART CATH AND CORONARY ANGIOGRAPHY;  Surgeon: Kathleene Hazel, MD;  Location: [facility];  Service: Cardiovascular;  Laterality: N/A;   LEG SURGERY Right    POLYPECTOMY  04/20/2022   Procedure: POLYPECTOMY;  Surgeon: Lanelle Bal, DO;  Location: [facility];  Service: Endoscopy;;   REPLACEMENT TOTAL KNEE Right    SPINAL CORD STIMULATOR IMPLANT      Current Outpatient Medications  Medication Sig Dispense Refill   aspirin EC 81 MG tablet Take 1 tablet (81 mg total) by mouth daily. Swallow whole. 90 tablet 3   atorvastatin (LIPITOR) 80 MG tablet TAKE 1 TABLET BY MOUTH ONCE DAILY AT  6PM 90 tablet 2   calcitRIOL (ROCALTROL) 0.25 MCG capsule Take 0.25 mcg by mouth every other day.     carvedilol (COREG) 3.125 MG tablet Take by mouth.     Continuous Blood Gluc Sensor (DEXCOM G6 SENSOR) MISC 1 Device by Other route as directed.     EPINEPHrine 0.3 mg/0.3 mL IJ SOAJ injection Inject 0.3 mg into the muscle as needed for anaphylaxis. 2 each 1   fish oil-omega-3 fatty acids 1000  MG capsule Take 1 g by mouth in the morning and at bedtime.     furosemide (LASIX) 20 MG tablet Take 20 mg by mouth. Take 2 tablets in the morning and 1 tablet in the evening     furosemide (LASIX) 40 MG tablet Take 20-40 mg by mouth 2 (two) times daily. 40 MG AM, 20 MG PM     Insulin Human (INSULIN PUMP) SOLN Inject into the skin continuous.      Insulin Pen Needle (PEN NEEDLES 31GX5/16") 31G X 8 MM MISC      JARDIANCE 10 MG TABS tablet Take 5 mg by mouth every other day.     KERENDIA 10 MG TABS Take 1 tablet by mouth daily.     levothyroxine (SYNTHROID) 112 MCG tablet Take 112 mcg by mouth daily before breakfast.     linaclotide (LINZESS) 290 MCG CAPS capsule Take 1 capsule (290 mcg total) by mouth daily before breakfast. 30 capsule 5   loratadine (CLARITIN) 10 MG tablet Take 2 tablets (20 mg total) by mouth daily. (  Patient not taking: Reported on 12/28/2022) 180 tablet 3   metFORMIN (GLUCOPHAGE) 500 MG tablet Take 1 tablet (500 mg total) by mouth 2 (two) times daily with a meal.     nitroGLYCERIN (NITROSTAT) 0.4 MG SL tablet Place 1 tablet (0.4 mg total) under the tongue every 5 (five) minutes as needed for chest pain. 25 tablet 3   NOVOLOG 100 UNIT/ML injection Inject into the skin continuous. In insulin pump  0   pantoprazole (PROTONIX) 40 MG tablet Take 1 tablet by mouth once daily 90 tablet 0   triamcinolone ointment (KENALOG) 0.1 % Apply 1 Application topically 2 (two) times daily. 30 g 0   No current facility-administered medications for this visit.    Allergies as of 02/08/2023 - Review Complete 01/29/2023  Allergen Reaction Noted   Other Nausea And Vomiting and Other (See Comments) 08/04/2015   Fentanyl Nausea And Vomiting and Other (See Comments) 11/09/2016    Family History  Problem Relation Age of Onset   Cancer Mother 28       colon   Cancer Father        renal cell   Colon cancer Father    Breast cancer Sister    Cancer Sister        primary brain, and primary breast  too   Breast cancer Sister    Cancer Sister        breast    Breast cancer Sister    Breast cancer Sister    Cancer Brother        kidney and liver   Sleep apnea Neg Hx     Social History   Socioeconomic History   Marital status: Widowed    Spouse name: Not on file   Number of children: 0   Years of education: Not on file   Highest education level: Bachelor's degree (e.g., BA, AB, BS)  Occupational History   Occupation: Retired  Tobacco Use   Smoking status: Never   Smokeless tobacco: Never  Vaping Use   Vaping Use: Never used  Substance and Sexual Activity   Alcohol use: No    Alcohol/week: 0.0 standard drinks of alcohol   Drug use: No   Sexual activity: Not Currently    Partners: Male  Other Topics Concern   Not on file  Social History Narrative   Drinks about 1 cup coffee day.   Cardiac rehab 3x week   One level home with significant other   Bachelors in Accounting      Right handed   No children   widow   Social Determinants of Health   Financial Resource Strain: Not on file  Food Insecurity: Not on file  Transportation Needs: Not on file  Physical Activity: Not on file  Stress: Not on file  Social Connections: Not on file    Review of Systems: Gen: Denies fever, chills, anorexia. Denies fatigue, weakness, weight loss.  CV: Denies chest pain, palpitations, syncope, peripheral edema, and claudication. Resp: Denies dyspnea at rest, cough, wheezing, coughing up blood, and pleurisy. GI: Denies vomiting blood, jaundice, and fecal incontinence.   Denies dysphagia or odynophagia. Derm: Denies rash, itching, dry skin Psych: Denies depression, anxiety, memory loss, confusion. No homicidal or suicidal ideation.  Heme: Denies bruising, bleeding, and enlarged lymph nodes.  Physical Exam: There were no vitals taken for this visit. General:   Alert and oriented. No distress noted. Pleasant and cooperative.  Head:  Normocephalic and atraumatic. Eyes:   Conjuctiva clear without  scleral icterus. Heart:  S1, S2 present without murmurs appreciated. Lungs:  Clear to auscultation bilaterally. No wheezes, rales, or rhonchi. No distress.  Abdomen:  +BS, soft, non-tender and non-distended. No rebound or guarding. No HSM or masses noted. Msk:  Symmetrical without gross deformities. Normal posture. Extremities:  Without edema. Neurologic:  Alert and  oriented x4 Psych:  Normal mood and affect.    Assessment:     Plan:  ***   Ermalinda Memos, PA-C [facility] 02/08/2023

## 2023-02-08 ENCOUNTER — Encounter: Payer: Self-pay | Admitting: Gastroenterology

## 2023-02-08 ENCOUNTER — Ambulatory Visit (INDEPENDENT_AMBULATORY_CARE_PROVIDER_SITE_OTHER): Payer: Medicare Other | Admitting: Gastroenterology

## 2023-02-08 VITALS — BP 118/67 | HR 97 | Temp 97.2°F | Ht 68.0 in | Wt 258.6 lb

## 2023-02-08 DIAGNOSIS — R110 Nausea: Secondary | ICD-10-CM | POA: Diagnosis not present

## 2023-02-08 DIAGNOSIS — R109 Unspecified abdominal pain: Secondary | ICD-10-CM

## 2023-02-08 DIAGNOSIS — K5900 Constipation, unspecified: Secondary | ICD-10-CM | POA: Diagnosis not present

## 2023-02-08 MED ORDER — PANTOPRAZOLE SODIUM 40 MG PO TBEC: 40.0000 mg | DELAYED_RELEASE_TABLET | Freq: Two times a day (BID) | ORAL | 3 refills | Status: DC

## 2023-02-08 NOTE — Patient Instructions (Addendum)
We will arrange her to have an upper endoscopy to further evaluate your abdominal pain and nausea after eating. You will need to hold Jardiance for 3 days prior to your procedure. No morning diabetes medications day of procedure.  Recommend that you increase pantoprazole to 40 mg twice daily to see if this will help with your nausea and abdominal pain.   For constipation: Stop Linzess.  Complete MiraLAX bowel prep: CLEAR LIQUIDS ALL DAY-  At 10:00 AM, take 2 DULCOLAX 5mg  tablets  At 12:00 PM, Mix 5 teaspoons of Miralax in any 4-6 ounces of CLEAR LIQUIDS (Gatorade) every hour for 5 hours until passing clear, watery stools. Be sure to drink 4 ounces of clear liquid 30 minutes after each dose of Miralax.   At 3:00 PM, take 2 Dulcolax 5mg  tablets  If stools are not clear & watery by 6:00 PM, take 5 teaspoons of Miralax every 30 minutes until stools are clear (no color)   After completing MiraLAX bowel prep, start Trulance 3 mg daily.  We are providing you with samples.  Please let me know how this works for you after 1 week.   We will plan to follow-up with you in the office after your upper endoscopy.  Ermalinda Memos, PA-C [facility]

## 2023-02-14 ENCOUNTER — Ambulatory Visit (INDEPENDENT_AMBULATORY_CARE_PROVIDER_SITE_OTHER): Payer: Medicare Other

## 2023-02-14 DIAGNOSIS — J309 Allergic rhinitis, unspecified: Secondary | ICD-10-CM

## 2023-02-15 ENCOUNTER — Other Ambulatory Visit: Payer: Self-pay | Admitting: Gastroenterology

## 2023-02-15 DIAGNOSIS — K5904 Chronic idiopathic constipation: Secondary | ICD-10-CM

## 2023-02-15 MED ORDER — MOTEGRITY 2 MG PO TABS: 2.0000 mg | ORAL_TABLET | Freq: Every day | ORAL | 3 refills | Status: DC

## 2023-02-19 ENCOUNTER — Encounter: Payer: Self-pay | Admitting: *Deleted

## 2023-02-19 ENCOUNTER — Telehealth: Payer: Self-pay | Admitting: *Deleted

## 2023-02-19 DIAGNOSIS — E039 Hypothyroidism, unspecified: Secondary | ICD-10-CM | POA: Diagnosis not present

## 2023-02-19 DIAGNOSIS — I10 Essential (primary) hypertension: Secondary | ICD-10-CM | POA: Diagnosis not present

## 2023-02-19 DIAGNOSIS — E109 Type 1 diabetes mellitus without complications: Secondary | ICD-10-CM | POA: Diagnosis not present

## 2023-02-19 DIAGNOSIS — Z9641 Presence of insulin pump (external) (internal): Secondary | ICD-10-CM | POA: Diagnosis not present

## 2023-02-19 NOTE — Telephone Encounter (Signed)
Can we not do an appeal for her?

## 2023-02-19 NOTE — Telephone Encounter (Signed)
LMTRC   EGD w/Dr.Carver, ASA 3 

## 2023-02-20 ENCOUNTER — Encounter: Payer: Self-pay | Admitting: *Deleted

## 2023-02-20 DIAGNOSIS — E108 Type 1 diabetes mellitus with unspecified complications: Secondary | ICD-10-CM | POA: Diagnosis not present

## 2023-02-20 DIAGNOSIS — F339 Major depressive disorder, recurrent, unspecified: Secondary | ICD-10-CM | POA: Diagnosis not present

## 2023-02-20 DIAGNOSIS — Z299 Encounter for prophylactic measures, unspecified: Secondary | ICD-10-CM | POA: Diagnosis not present

## 2023-02-20 DIAGNOSIS — N183 Chronic kidney disease, stage 3 unspecified: Secondary | ICD-10-CM | POA: Diagnosis not present

## 2023-02-20 DIAGNOSIS — R5383 Other fatigue: Secondary | ICD-10-CM | POA: Diagnosis not present

## 2023-02-21 ENCOUNTER — Ambulatory Visit (INDEPENDENT_AMBULATORY_CARE_PROVIDER_SITE_OTHER): Payer: Medicare Other

## 2023-02-21 DIAGNOSIS — J309 Allergic rhinitis, unspecified: Secondary | ICD-10-CM

## 2023-02-22 ENCOUNTER — Encounter: Payer: Self-pay | Admitting: *Deleted

## 2023-02-24 ENCOUNTER — Other Ambulatory Visit: Payer: Self-pay | Admitting: Gastroenterology

## 2023-02-24 DIAGNOSIS — R109 Unspecified abdominal pain: Secondary | ICD-10-CM

## 2023-02-24 DIAGNOSIS — R110 Nausea: Secondary | ICD-10-CM

## 2023-02-28 ENCOUNTER — Ambulatory Visit (INDEPENDENT_AMBULATORY_CARE_PROVIDER_SITE_OTHER): Payer: Medicare Other

## 2023-02-28 DIAGNOSIS — J309 Allergic rhinitis, unspecified: Secondary | ICD-10-CM

## 2023-02-28 NOTE — Progress Notes (Addendum)
[facility] - Clinic Note  03/05/2023     CHIEF COMPLAINT Patient presents for Retina Follow Up   HISTORY OF PRESENT ILLNESS: Jodi Ray is a 62 y.o. female who presents to the clinic today for:   HPI     Retina Follow Up   Patient presents with  Diabetic Retinopathy.  In both eyes.  This started 5 weeks ago.  I, the attending physician,  performed the HPI with the patient and updated documentation appropriately.        Comments   Patient here for 5 weeks retina follow up for PDR OU. Patient states vision doing good. OS hurts a little sometimes.       Last edited by Rennis Chris, MD on 03/05/2023  8:26 AM.    Pt states her blood sugars have been "wonky", she states her last A1c was 6.3 and it's usually 5.9, her endocrinologist is trying to figure out what is going on   Referring physician: Kirstie Peri, MD [address] [place],  [place] [address]  HISTORICAL INFORMATION:   Selected notes from the MEDICAL RECORD NUMBER Referred by Dr. [name] for concern of DME LEE:  Ocular Hx- PMH-    CURRENT MEDICATIONS: No current outpatient medications on file. (Ophthalmic Drugs)   No current facility-administered medications for this visit. (Ophthalmic Drugs)   Current Outpatient Medications (Other)  Medication Sig   aspirin EC 81 MG tablet Take 1 tablet (81 mg total) by mouth daily. Swallow whole.   atorvastatin (LIPITOR) 80 MG tablet TAKE 1 TABLET BY MOUTH ONCE DAILY AT  6PM   calcitRIOL (ROCALTROL) 0.25 MCG capsule Take 0.25 mcg by mouth every other day.   carvedilol (COREG) 3.125 MG tablet Take by mouth.   Continuous Blood Gluc Sensor (DEXCOM G6 SENSOR) MISC 1 Device by Other route as directed.   EPINEPHrine 0.3 mg/0.3 mL IJ SOAJ injection Inject 0.3 mg into the muscle as needed for anaphylaxis.   fish oil-omega-3 fatty acids 1000 MG capsule Take 1 g by mouth in the morning and at bedtime.   furosemide (LASIX) 20 MG tablet Take 20 mg by mouth. Take 2  tablets in the morning and 1 tablet in the evening   furosemide (LASIX) 40 MG tablet Take 20-40 mg by mouth 2 (two) times daily. 40 MG AM, 20 MG PM   Insulin Pen Needle (PEN NEEDLES 31GX5/16") 31G X 8 MM MISC    JARDIANCE 10 MG TABS tablet Take 5 mg by mouth every other day.   KERENDIA 10 MG TABS Take 1 tablet by mouth daily.   levothyroxine (SYNTHROID) 112 MCG tablet Take 112 mcg by mouth daily before breakfast.   metFORMIN (GLUCOPHAGE) 500 MG tablet Take 1 tablet (500 mg total) by mouth 2 (two) times daily with a meal.   nitroGLYCERIN (NITROSTAT) 0.4 MG SL tablet Place 1 tablet (0.4 mg total) under the tongue every 5 (five) minutes as needed for chest pain.   NOVOLOG 100 UNIT/ML injection Inject into the skin continuous. In insulin pump   pantoprazole (PROTONIX) 40 MG tablet Take 1 tablet (40 mg total) by mouth 2 (two) times daily before a meal.   Prucalopride Succinate (MOTEGRITY) 2 MG TABS Take 1 tablet (2 mg total) by mouth daily at 6 (six) AM.   triamcinolone ointment (KENALOG) 0.1 % Apply 1 Application topically 2 (two) times daily.   Insulin Human (INSULIN PUMP) SOLN Inject into the skin continuous.    loratadine (CLARITIN) 10 MG tablet  Take 2 tablets (20 mg total) by mouth daily. (Patient not taking: Reported on 12/28/2022)   No current facility-administered medications for this visit. (Other)   REVIEW OF SYSTEMS: ROS   Positive for: Endocrine, Cardiovascular, Eyes Negative for: Constitutional, Gastrointestinal, Neurological, Skin, Genitourinary, Musculoskeletal, HENT, Respiratory, Psychiatric, Allergic/Imm, Heme/Lymph Last edited by Laddie Aquas, COA on 03/05/2023  7:39 AM.       ALLERGIES Allergies  Allergen Reactions   Other Nausea And Vomiting and Other (See Comments)    Opiates Alters mental status diarrhea   Fentanyl Nausea And Vomiting and Other (See Comments)    Nausea, vomiting, loss of consciousness, requiring reversal   PAST MEDICAL HISTORY Past Medical  History:  Diagnosis Date   Adult RDS (HCC)    Anemia    Brain aneurysm    frontal lobe   CAD (coronary artery disease)    a. 02/2018: s/p DES to Proximal LAD with residual 20% RCA stenosis.   Chronic pain    Diabetes mellitus without complication (HCC)    Headache    History of left bundle branch block (LBBB)    HTN (hypertension)    Hx of cardiovascular stress test 10/2016   intermediate risk study   Hypothyroidism    Neuromuscular disorder (HCC)    Neuropathy    Obesity    OSA on CPAP 03/10/2015   Renal insufficiency    Retinopathy    Sleep apnea    Varicose veins of both lower extremities    Past Surgical History:  Procedure Laterality Date   BIOPSY  11/14/2016   Procedure: BIOPSY;  Surgeon: West Bali, MD;  Location: [facility];  Service: Endoscopy;;  colon gastric duodenal   BREAST EXCISIONAL BIOPSY Right    BREAST SURGERY Left 1994   Lumpectomy   COLONOSCOPY WITH PROPOFOL N/A 11/14/2016   Dr. Darrick Penna: Moderately redundant rectosigmoid colon. Random colon biopsies benign. Internal hemorrhoids. Surveillance colonoscopy in 5 years.   COLONOSCOPY WITH PROPOFOL N/A 04/20/2022   Procedure: COLONOSCOPY WITH PROPOFOL;  Surgeon: Lanelle Bal, DO;  Location: [facility];  Service: Endoscopy;  Laterality: N/A;  12:30am   CORONARY STENT INTERVENTION N/A 03/22/2018   Procedure: CORONARY STENT INTERVENTION;  Surgeon: Kathleene Hazel, MD;  Location: [facility];  Service: Cardiovascular;  Laterality: N/A;   ESOPHAGOGASTRODUODENOSCOPY (EGD) WITH PROPOFOL N/A 11/14/2016   Dr. Darrick Penna: Esophagus normal. Moderate gastritis, few gastric polyps. Duodenal biopsies negative. Fundic gland polyp gastric polyp area no H pylori.   FOOT SURGERY     LEFT HEART CATH AND CORONARY ANGIOGRAPHY N/A 03/22/2018   Procedure: LEFT HEART CATH AND CORONARY ANGIOGRAPHY;  Surgeon: Kathleene Hazel, MD;  Location: [facility];  Service: Cardiovascular;  Laterality: N/A;    LEG SURGERY Right    POLYPECTOMY  04/20/2022   Procedure: POLYPECTOMY;  Surgeon: Lanelle Bal, DO;  Location: [facility];  Service: Endoscopy;;   REPLACEMENT TOTAL KNEE Right    SPINAL CORD STIMULATOR IMPLANT     FAMILY HISTORY Family History  Problem Relation Age of Onset   Cancer Mother 66       colon   Cancer Father        renal cell   Colon cancer Father    Breast cancer Sister    Cancer Sister        primary brain, and primary breast too   Breast cancer Sister    Cancer Sister        breast  Breast cancer Sister    Breast cancer Sister    Cancer Brother        kidney and liver   Sleep apnea Neg Hx    SOCIAL HISTORY Social History   Tobacco Use   Smoking status: Never   Smokeless tobacco: Never  Vaping Use   Vaping status: Never Used  Substance Use Topics   Alcohol use: No    Alcohol/week: 0.0 standard drinks of alcohol   Drug use: No       OPHTHALMIC EXAM:  Base Eye Exam     Visual Acuity (Snellen - Linear)       Right Left   Dist cc 20/30 -2 20/20 -2   Dist ph cc 20/20 -2     Correction: Glasses         Tonometry (Tonopen, 7:37 AM)       Right Left   Pressure 16 16         Pupils       Dark Light Shape React APD   Right 3 2 Round Brisk None   Left 3 2 Round Brisk None         Visual Fields (Counting fingers)       Left Right    Full Full         Extraocular Movement       Right Left    Full, Ortho Full, Ortho         Neuro/Psych     Oriented x3: Yes   Mood/Affect: Normal         Dilation     Both eyes: 1.0% Mydriacyl, 2.5% Phenylephrine @ 7:37 AM           Slit Lamp and Fundus Exam     Slit Lamp Exam       Right Left   Lids/Lashes Dermatochalasis - upper lid, mild MGD Dermatochalasis - upper lid, mild MGD   Conjunctiva/Sclera nasal pingeucula nasal pingeucula, Trace Injection   Cornea arcus, mild tear film debris arcus, mild tear film debris   Anterior Chamber deep and clear, no cell or  flare deep and clear, no cell or flare   Iris Round and dilated, No NVI Round and dilated, No NVI   Lens 2+ Nuclear sclerosis, 2-3+ Cortical cataract, 1+ Posterior subcapsular cataract 2+ Nuclear sclerosis, 2-3+ Cortical cataract, 1+ Posterior subcapsular cataract   Anterior Vitreous Vitreous syneresis Vitreous syneresis         Fundus Exam       Right Left   Disc Pink and Sharp, no NVD, temporal PPA Pink and Sharp, no NVD, temporal PPP   C/D Ratio 0.4 0.4   Macula Good foveal reflex, +cystic changes, scattered MA -- improved Flat, good foveal reflex, scattered MA -- improved, trace cystic changes   Vessels attenuated, Tortuous attenuated, Tortuous, +NV   Periphery Attached, dense 360 PRP extending posteriorly to arcades Attached, 360 PRP with good posterior fill in, room for fill in if needed           Refraction     Wearing Rx       Sphere Cylinder Axis Add   Right +1.75 +1.50 124 +2.50   Left +4.25 +2.25 001 +2.50           IMAGING AND PROCEDURES  Imaging and Procedures for 03/05/2023  OCT, Retina - OU - Both Eyes       Right Eye Quality was good. Central Foveal Thickness: 323. Progression has been stable.  Findings include normal foveal contour, no SRF, intraretinal fluid (Trace cystic changes centrally, mild interval improvement in non-central cystic changes temporal macula -- caught on widefield).   Left Eye Quality was good. Central Foveal Thickness: 307. Progression has worsened. Findings include normal foveal contour, no SRF, intraretinal fluid (Trace persistent cystic changes temporal macula -- slightly increased).   Notes *Images captured and stored on drive  Diagnosis / Impression:  +DME OU OD: Trace cystic changes centrally, mild interval improvement in non-central cystic changes temporal macula -- caught on widefield OS: Trace persistent cystic changes temporal macula -- slightly increased  Clinical management:  See below  Abbreviations: NFP -  Normal foveal profile. CME - cystoid macular edema. PED - pigment epithelial detachment. IRF - intraretinal fluid. SRF - subretinal fluid. EZ - ellipsoid zone. ERM - epiretinal membrane. ORA - outer retinal atrophy. ORT - outer retinal tubulation. SRHM - subretinal hyper-reflective material. IRHM - intraretinal hyper-reflective material      Intravitreal Injection, Pharmacologic Agent - OD - Right Eye       Time Out 03/05/2023. 8:06 AM. Confirmed correct patient, procedure, site, and patient consented.   Anesthesia Topical anesthesia was used. Anesthetic medications included Lidocaine 2%, Proparacaine 0.5%.   Procedure Preparation included 5% betadine to ocular surface, eyelid speculum. A (32g) needle was used.   Injection: 2 mg aflibercept 2 MG/0.05ML   Route: Intravitreal, Site: Right Eye   NDC: L6038910, Lot: [id number], Expiration date: 03/30/2024, Waste: 0 mL   Post-op Post injection exam found visual acuity of at least counting fingers. The patient tolerated the procedure well. There were no complications. The patient received written and verbal post procedure care education.      Intravitreal Injection, Pharmacologic Agent - OS - Left Eye       Time Out 03/05/2023. 8:06 AM. Confirmed correct patient, procedure, site, and patient consented.   Anesthesia Topical anesthesia was used. Anesthetic medications included Lidocaine 2%, Proparacaine 0.5%.   Procedure Preparation included 5% betadine to ocular surface, eyelid speculum. A supplied (32g) needle was used.   Injection: 1.25 mg Bevacizumab 1.25mg /0.57ml   Route: Intravitreal, Site: Left Eye   NDC: P3213405, Lot: [id number], Expiration date: 03/12/2023   Post-op Post injection exam found visual acuity of at least counting fingers. The patient tolerated the procedure well. There were no complications. The patient received written and verbal post procedure care education. Post injection medications were not given.             ASSESSMENT/PLAN:    ICD-10-CM   1. Proliferative diabetic retinopathy of both eyes with macular edema associated with type 2 diabetes mellitus (HCC)  E11.3513 OCT, Retina - OU - Both Eyes    Intravitreal Injection, Pharmacologic Agent - OD - Right Eye    Intravitreal Injection, Pharmacologic Agent - OS - Left Eye    Bevacizumab (AVASTIN) SOLN 1.25 mg    aflibercept (EYLEA) SOLN 2 mg    2. Long term (current) use of oral hypoglycemic drugs  Z79.84     3. Long-term (current) use of injectable non-insulin antidiabetic drugs  Z79.85     4. Current use of insulin (HCC)  Z79.4     5. Essential hypertension  I10     6. Hypertensive retinopathy of both eyes  H35.033     7. Combined forms of age-related cataract of both eyes  H25.813       1-4. Proliferative diabetic retinopathy OU - previously managed by Dr. Cathey Endow -- lost to f/u since 2020 -  s/p PRP OU and IVA OS x7 (BB) - s/p IVA OD #1 (01.02.24), #2 (02.02.24), #3 (03.01.24), #4(04.05.24) -- IVA resistance - s/p IVE OD #1 (05.03.24), #2 (06.03.24), #3 (07.01.24) - s/p IVA OS #1 (01.04.24), #2 (02.02.24), #3 (03.01.24), #4(04.05.24), #5 (05.03.24), #6 (06.03.24), #7 (07.01.24) - s/p PRP fill in OS (01.23.24) - exam shows scattered MA OU; laser PRP OU - FA (01.02.23) shows OD: Cluster of leakage temporal macula, no NV; OS: Scattered focal NVE greatest inferior and temporal quads -- will need PRP fill in OS -- completed Jan 2024 - BCVA 20/20 OU - OCT shows OD: Trace cystic changes centrally, mild interval improvement in non-central cystic changes temporal macula -- caught on widefield; OS: Trace persistent cystic changes temporal macula -- slightly increased at 5 weeks - recommend IVE OD #4 and IVA OS #8 today, 08.05.24 w/ f/u in 5 wks - pt wishes to proceed with injections - RBA of procedure discussed, questions answered - see procedure note - IVE informed consent obtained and signed, 05.03.24 (OU) - IVA informed consent  obtained and signed, 01.02.24 (OU) - f/u 5 weeks -- DFE, OCT, possible injxns  5,6. Hypertensive retinopathy OU - discussed importance of tight BP control - monitor  7. Mixed Cataract OU - The symptoms of cataract, surgical options, and treatments and risks were discussed with patient. - discussed diagnosis and progression - monitor  Ophthalmic Meds Ordered this visit:  Meds ordered this encounter  Medications   Bevacizumab (AVASTIN) SOLN 1.25 mg   aflibercept (EYLEA) SOLN 2 mg     Return in about 5 weeks (around 04/09/2023) for f/u PDR OU, DFE, OCT.  There are no Patient Instructions on file for this visit.  This document serves as a record of services personally performed by Karie Chimera, MD, PhD. It was created on their behalf by Annalee Genta, COMT. The creation of this record is the provider's dictation and/or activities during the visit.  Electronically signed by: Annalee Genta, COMT 03/05/23 1:03 PM  This document serves as a record of services personally performed by Karie Chimera, MD, PhD. It was created on their behalf by Glee Arvin. Manson Passey, OA an ophthalmic technician. The creation of this record is the provider's dictation and/or activities during the visit.    Electronically signed by: Glee Arvin. Manson Passey, OA 03/05/23 1:03 PM   Karie Chimera, M.D., Ph.D. Diseases & Surgery of the Retina and Vitreous [facility] 03/05/2023   I have reviewed the above documentation for accuracy and completeness, and I agree with the above. Karie Chimera, M.D., Ph.D. 03/05/23 1:04 PM  Abbreviations: M myopia (nearsighted); A astigmatism; H hyperopia (farsighted); P presbyopia; Mrx spectacle prescription;  CTL contact lenses; OD right eye; OS left eye; OU both eyes  XT exotropia; ET esotropia; PEK punctate epithelial keratitis; PEE punctate epithelial erosions; DES dry eye syndrome; MGD meibomian gland dysfunction; ATs artificial tears; PFAT's preservative free  artificial tears; NSC nuclear sclerotic cataract; PSC posterior subcapsular cataract; ERM epi-retinal membrane; PVD posterior vitreous detachment; RD retinal detachment; DM diabetes mellitus; DR diabetic retinopathy; NPDR non-proliferative diabetic retinopathy; PDR proliferative diabetic retinopathy; CSME clinically significant macular edema; DME diabetic macular edema; dbh dot blot hemorrhages; CWS cotton wool spot; POAG primary open angle glaucoma; C/D cup-to-disc ratio; HVF humphrey visual field; GVF goldmann visual field; OCT optical coherence tomography; IOP intraocular pressure; BRVO Branch retinal vein occlusion; CRVO central retinal vein occlusion; CRAO central retinal artery occlusion; BRAO branch retinal artery occlusion;  RT retinal tear; SB scleral buckle; PPV pars plana vitrectomy; VH Vitreous hemorrhage; PRP panretinal laser photocoagulation; IVK intravitreal kenalog; VMT vitreomacular traction; MH Macular hole;  NVD neovascularization of the disc; NVE neovascularization elsewhere; AREDS age related eye disease study; ARMD age related macular degeneration; POAG primary open angle glaucoma; EBMD epithelial/anterior basement membrane dystrophy; ACIOL anterior chamber intraocular lens; IOL intraocular lens; PCIOL posterior chamber intraocular lens; Phaco/IOL phacoemulsification with intraocular lens placement; PRK photorefractive keratectomy; LASIK laser assisted in situ keratomileusis; HTN hypertension; DM diabetes mellitus; COPD chronic obstructive pulmonary disease

## 2023-03-01 NOTE — Progress Notes (Signed)
VIALS EXP 02-29-24

## 2023-03-02 ENCOUNTER — Telehealth: Payer: Self-pay | Admitting: *Deleted

## 2023-03-02 NOTE — Telephone Encounter (Signed)
Sent the appeal for Motegrity 2mg  a week ago. I have called several times and they are still going over the appeal. I will try again today.

## 2023-03-02 NOTE — Telephone Encounter (Signed)
Noted  

## 2023-03-05 ENCOUNTER — Encounter: Payer: Self-pay | Admitting: *Deleted

## 2023-03-05 ENCOUNTER — Encounter (INDEPENDENT_AMBULATORY_CARE_PROVIDER_SITE_OTHER): Payer: Self-pay | Admitting: Ophthalmology

## 2023-03-05 ENCOUNTER — Ambulatory Visit (INDEPENDENT_AMBULATORY_CARE_PROVIDER_SITE_OTHER): Payer: Medicare Other | Admitting: Ophthalmology

## 2023-03-05 DIAGNOSIS — I10 Essential (primary) hypertension: Secondary | ICD-10-CM

## 2023-03-05 DIAGNOSIS — H35033 Hypertensive retinopathy, bilateral: Secondary | ICD-10-CM | POA: Diagnosis not present

## 2023-03-05 DIAGNOSIS — Z7984 Long term (current) use of oral hypoglycemic drugs: Secondary | ICD-10-CM | POA: Diagnosis not present

## 2023-03-05 DIAGNOSIS — J3081 Allergic rhinitis due to animal (cat) (dog) hair and dander: Secondary | ICD-10-CM | POA: Diagnosis not present

## 2023-03-05 DIAGNOSIS — Z7985 Long-term (current) use of injectable non-insulin antidiabetic drugs: Secondary | ICD-10-CM | POA: Diagnosis not present

## 2023-03-05 DIAGNOSIS — H25813 Combined forms of age-related cataract, bilateral: Secondary | ICD-10-CM | POA: Diagnosis not present

## 2023-03-05 DIAGNOSIS — Z794 Long term (current) use of insulin: Secondary | ICD-10-CM

## 2023-03-05 DIAGNOSIS — E113513 Type 2 diabetes mellitus with proliferative diabetic retinopathy with macular edema, bilateral: Secondary | ICD-10-CM | POA: Diagnosis not present

## 2023-03-05 MED ORDER — AFLIBERCEPT 2MG/0.05ML IZ SOLN FOR KALEIDOSCOPE
2.0000 mg | INTRAVITREAL | Status: AC | PRN
Start: 2023-03-05 — End: 2023-03-05
  Administered 2023-03-05: 2 mg via INTRAVITREAL

## 2023-03-05 MED ORDER — BEVACIZUMAB CHEMO INJECTION 1.25MG/0.05ML SYRINGE FOR KALEIDOSCOPE
1.2500 mg | INTRAVITREAL | Status: AC | PRN
Start: 2023-03-05 — End: 2023-03-05
  Administered 2023-03-05: 1.25 mg via INTRAVITREAL

## 2023-03-06 DIAGNOSIS — Z299 Encounter for prophylactic measures, unspecified: Secondary | ICD-10-CM | POA: Diagnosis not present

## 2023-03-06 DIAGNOSIS — F410 Panic disorder [episodic paroxysmal anxiety] without agoraphobia: Secondary | ICD-10-CM | POA: Diagnosis not present

## 2023-03-06 DIAGNOSIS — J3089 Other allergic rhinitis: Secondary | ICD-10-CM

## 2023-03-06 DIAGNOSIS — F339 Major depressive disorder, recurrent, unspecified: Secondary | ICD-10-CM | POA: Diagnosis not present

## 2023-03-06 DIAGNOSIS — I10 Essential (primary) hypertension: Secondary | ICD-10-CM | POA: Diagnosis not present

## 2023-03-06 DIAGNOSIS — N183 Chronic kidney disease, stage 3 unspecified: Secondary | ICD-10-CM | POA: Diagnosis not present

## 2023-03-06 NOTE — Telephone Encounter (Signed)
Reviewed. No additional recommendations.  ?

## 2023-03-06 NOTE — Telephone Encounter (Signed)
Sent pt a MyChart message

## 2023-03-07 ENCOUNTER — Ambulatory Visit (INDEPENDENT_AMBULATORY_CARE_PROVIDER_SITE_OTHER): Payer: Medicare Other

## 2023-03-07 DIAGNOSIS — J309 Allergic rhinitis, unspecified: Secondary | ICD-10-CM

## 2023-03-12 ENCOUNTER — Encounter: Payer: Self-pay | Admitting: *Deleted

## 2023-03-12 ENCOUNTER — Telehealth: Payer: Self-pay | Admitting: *Deleted

## 2023-03-12 NOTE — Telephone Encounter (Signed)
Received approval letter for Motegrity from 02/09/2023-03/11/2019. Sent copy to scan center. Notified pt.

## 2023-03-12 NOTE — Telephone Encounter (Signed)
Great news.

## 2023-03-14 ENCOUNTER — Ambulatory Visit (INDEPENDENT_AMBULATORY_CARE_PROVIDER_SITE_OTHER): Payer: Medicare Other

## 2023-03-14 DIAGNOSIS — J309 Allergic rhinitis, unspecified: Secondary | ICD-10-CM

## 2023-03-14 NOTE — Patient Instructions (Signed)
Jodi Ray  03/14/2023     @PREFPERIOPPHARMACY @   Your procedure is scheduled on  03/19/2023.   Report to [facility] at  0815  A.M.   Call this number if you have problems the morning of surgery:  [phone]  If you experience any cold or flu symptoms such as cough, fever, chills, shortness of breath, etc. between now and your scheduled surgery, please notify us at the above number.   Remember:  Follow the diet instructions given to you by the office.      Your last dose of jardiance should be on 03/15/23.      DO NOT take any medications for diabetes the morning of your procedure.   If you are type 1 diabetic, dial your insulin pump down to basal rate at midnight.    Take these medicines the morning of surgery with A SIP OF WATER       carvedilol, kerenda, levothyroxine, pantoprazole.     Do not wear jewelry, make-up or nail polish, including gel polish,  artificial nails, or any other type of covering on natural nails (fingers and  toes).  Do not wear lotions, powders, or perfumes, or deodorant.  Do not shave 48 hours prior to surgery.  Men may shave face and neck.  Do not bring valuables to the hospital.  [facility] is not responsible for any belongings or valuables.  Contacts, dentures or bridgework may not be worn into surgery.  Leave your suitcase in the car.  After surgery it may be brought to your room.  For patients admitted to the hospital, discharge time will be determined by your treatment team.  Patients discharged the day of surgery will not be allowed to drive home and must have someone with them for 24 hours.    Special instructions:   DO NOT smoke tobacco or vape for 24 hours before your procedure.  Please read over the following fact sheets that you were given. Anesthesia Post-op Instructions and Care and Recovery After Surgery      Upper Endoscopy, Adult, Care After After the procedure, it is common to have a sore throat. It is  also common to have: Mild stomach pain or discomfort. Bloating. Nausea. Follow these instructions at home: The instructions below may help you care for yourself at home. Your health care provider may give you more instructions. If you have questions, ask your health care provider. If you were given a sedative during the procedure, it can affect you for several hours. Do not drive or operate machinery until your health care provider says that it is safe. If you will be going home right after the procedure, plan to have a responsible adult: Take you home from the hospital or clinic. You will not be allowed to drive. Care for you for the time you are told. Follow instructions from your health care provider about what you may eat and drink. Return to your normal activities as told by your health care provider. Ask your health care provider what activities are safe for you. Take over-the-counter and prescription medicines only as told by your health care provider. Contact a health care provider if you: Have a sore throat that lasts longer than one day. Have trouble swallowing. Have a fever. Get help right away if you: Vomit blood or your vomit looks like coffee grounds. Have bloody, black, or tarry stools. Have a very bad sore throat or you cannot swallow. Have difficulty  breathing or very bad pain in your chest or abdomen. These symptoms may be an emergency. Get help right away. Call 911. Do not wait to see if the symptoms will go away. Do not drive yourself to the hospital. Summary After the procedure, it is common to have a sore throat, mild stomach discomfort, bloating, and nausea. If you were given a sedative during the procedure, it can affect you for several hours. Do not drive until your health care provider says that it is safe. Follow instructions from your health care provider about what you may eat and drink. Return to your normal activities as told by your health care  provider. This information is not intended to replace advice given to you by your health care provider. Make sure you discuss any questions you have with your health care provider. Document Revised: 10/26/2021 Document Reviewed: 10/26/2021 Elsevier Patient Education  2024 [employer]vier Inc. Monitored Anesthesia Care, Care After The following information offers guidance on how to care for yourself after your procedure. Your health care provider may also give you more specific instructions. If you have problems or questions, contact your health care provider. What can I expect after the procedure? After the procedure, it is common to have: Tiredness. Little or no memory about what happened during or after the procedure. Impaired judgment when it comes to making decisions. Nausea or vomiting. Some trouble with balance. Follow these instructions at home: For the time period you were told by your health care provider:  Rest. Do not participate in activities where you could fall or become injured. Do not drive or use machinery. Do not drink alcohol. Do not take sleeping pills or medicines that cause drowsiness. Do not make important decisions or sign legal documents. Do not take care of children on your own. Medicines Take over-the-counter and prescription medicines only as told by your health care provider. If you were prescribed antibiotics, take them as told by your health care provider. Do not stop using the antibiotic even if you start to feel better. Eating and drinking Follow instructions from your health care provider about what you may eat and drink. Drink enough fluid to keep your urine pale yellow. If you vomit: Drink clear fluids slowly and in small amounts as you are able. Clear fluids include water, ice chips, low-calorie sports drinks, and fruit juice that has water added to it (diluted fruit juice). Eat light and bland foods in small amounts as you are able. These foods include  bananas, applesauce, rice, lean meats, toast, and crackers. General instructions  Have a responsible adult stay with you for the time you are told. It is important to have someone help care for you until you are awake and alert. If you have sleep apnea, surgery and some medicines can increase your risk for breathing problems. Follow instructions from your health care provider about wearing your sleep device: When you are sleeping. This includes during daytime naps. While taking prescription pain medicines, sleeping medicines, or medicines that make you drowsy. Do not use any products that contain nicotine or tobacco. These products include cigarettes, chewing tobacco, and vaping devices, such as e-cigarettes. If you need help quitting, ask your health care provider. Contact a health care provider if: You feel nauseous or vomit every time you eat or drink. You feel light-headed. You are still sleepy or having trouble with balance after 24 hours. You get a rash. You have a fever. You have redness or swelling around the IV site. Get  help right away if: You have trouble breathing. You have new confusion after you get home. These symptoms may be an emergency. Get help right away. Call 911. Do not wait to see if the symptoms will go away. Do not drive yourself to the hospital. This information is not intended to replace advice given to you by your health care provider. Make sure you discuss any questions you have with your health care provider. Document Revised: 12/12/2021 Document Reviewed: 12/12/2021 Elsevier Patient Education  2024 [employer].

## 2023-03-15 ENCOUNTER — Encounter (HOSPITAL_COMMUNITY)
Admission: RE | Admit: 2023-03-15 | Discharge: 2023-03-15 | Disposition: A | Discharge status: Home / Self Care | Payer: Medicare Other | Source: Ambulatory Visit | Attending: Internal Medicine | Admitting: Internal Medicine

## 2023-03-15 ENCOUNTER — Encounter: Payer: Self-pay | Admitting: *Deleted

## 2023-03-15 VITALS — HR 95 | Temp 97.8°F | Resp 18 | Ht 68.0 in | Wt 258.6 lb

## 2023-03-15 DIAGNOSIS — E1169 Type 2 diabetes mellitus with other specified complication: Secondary | ICD-10-CM | POA: Diagnosis not present

## 2023-03-15 DIAGNOSIS — R9431 Abnormal electrocardiogram [ECG] [EKG]: Secondary | ICD-10-CM | POA: Diagnosis not present

## 2023-03-15 DIAGNOSIS — Z01812 Encounter for preprocedural laboratory examination: Secondary | ICD-10-CM | POA: Insufficient documentation

## 2023-03-15 DIAGNOSIS — Z01810 Encounter for preprocedural cardiovascular examination: Secondary | ICD-10-CM | POA: Diagnosis not present

## 2023-03-15 DIAGNOSIS — Z01818 Encounter for other preprocedural examination: Secondary | ICD-10-CM | POA: Diagnosis present

## 2023-03-15 LAB — BASIC METABOLIC PANEL
Anion gap: 13 (ref 5–15)
BUN: 35 mg/dL — ABNORMAL HIGH (ref 8–23)
CO2: 26 mmol/L (ref 22–32)
Calcium: 9.1 mg/dL (ref 8.9–10.3)
Chloride: 98 mmol/L (ref 98–111)
Creatinine, Ser: 1.33 mg/dL — ABNORMAL HIGH (ref 0.44–1.00)
GFR, Estimated: 45 mL/min — ABNORMAL LOW (ref >60)
Glucose, Bld: 85 mg/dL (ref 70–99)
Potassium: 4 mmol/L (ref 3.5–5.1)
Sodium: 137 mmol/L (ref 135–145)

## 2023-03-16 DIAGNOSIS — H6123 Impacted cerumen, bilateral: Secondary | ICD-10-CM | POA: Diagnosis not present

## 2023-03-19 ENCOUNTER — Encounter (HOSPITAL_COMMUNITY): Payer: Self-pay

## 2023-03-19 ENCOUNTER — Ambulatory Visit (HOSPITAL_COMMUNITY)
Admission: RE | Admit: 2023-03-19 | Discharge: 2023-03-19 | Disposition: A | Discharge status: Home / Self Care | Payer: Medicare Other | Attending: Internal Medicine | Admitting: Internal Medicine

## 2023-03-19 ENCOUNTER — Ambulatory Visit (HOSPITAL_BASED_OUTPATIENT_CLINIC_OR_DEPARTMENT_OTHER): Payer: Medicare Other | Admitting: Anesthesiology

## 2023-03-19 ENCOUNTER — Telehealth: Payer: Self-pay | Admitting: Gastroenterology

## 2023-03-19 ENCOUNTER — Ambulatory Visit (HOSPITAL_COMMUNITY): Payer: Medicare Other | Admitting: Anesthesiology

## 2023-03-19 ENCOUNTER — Encounter (HOSPITAL_COMMUNITY)
Admission: RE | Disposition: A | Discharge status: Home / Self Care | Payer: Self-pay | Source: Home / Self Care | Attending: Internal Medicine

## 2023-03-19 DIAGNOSIS — R109 Unspecified abdominal pain: Secondary | ICD-10-CM | POA: Diagnosis not present

## 2023-03-19 DIAGNOSIS — I2510 Atherosclerotic heart disease of native coronary artery without angina pectoris: Secondary | ICD-10-CM

## 2023-03-19 DIAGNOSIS — K317 Polyp of stomach and duodenum: Secondary | ICD-10-CM | POA: Insufficient documentation

## 2023-03-19 DIAGNOSIS — K2990 Gastroduodenitis, unspecified, without bleeding: Secondary | ICD-10-CM | POA: Diagnosis not present

## 2023-03-19 DIAGNOSIS — R112 Nausea with vomiting, unspecified: Secondary | ICD-10-CM | POA: Diagnosis present

## 2023-03-19 DIAGNOSIS — I10 Essential (primary) hypertension: Secondary | ICD-10-CM | POA: Diagnosis not present

## 2023-03-19 DIAGNOSIS — K2970 Gastritis, unspecified, without bleeding: Secondary | ICD-10-CM | POA: Diagnosis not present

## 2023-03-19 DIAGNOSIS — E782 Mixed hyperlipidemia: Secondary | ICD-10-CM

## 2023-03-19 DIAGNOSIS — R110 Nausea: Secondary | ICD-10-CM | POA: Diagnosis not present

## 2023-03-19 DIAGNOSIS — Z955 Presence of coronary angioplasty implant and graft: Secondary | ICD-10-CM | POA: Diagnosis not present

## 2023-03-19 DIAGNOSIS — R1010 Upper abdominal pain, unspecified: Secondary | ICD-10-CM | POA: Diagnosis present

## 2023-03-19 HISTORY — PX: ESOPHAGOGASTRODUODENOSCOPY (EGD) WITH PROPOFOL: SHX5813

## 2023-03-19 HISTORY — PX: BIOPSY: SHX5522

## 2023-03-19 LAB — GLUCOSE, CAPILLARY
Glucose-Capillary: 70 mg/dL (ref 70–99)
Glucose-Capillary: 115 mg/dL — ABNORMAL HIGH (ref 70–99)

## 2023-03-19 SURGERY — ESOPHAGOGASTRODUODENOSCOPY (EGD) WITH PROPOFOL
Anesthesia: General

## 2023-03-19 MED ORDER — LIDOCAINE HCL (CARDIAC) PF 100 MG/5ML IV SOSY
PREFILLED_SYRINGE | INTRAVENOUS | Status: DC | PRN
  Administered 2023-03-19: 50 mg via INTRAVENOUS

## 2023-03-19 MED ORDER — PROPOFOL 10 MG/ML IV BOLUS
INTRAVENOUS | Status: DC | PRN
Start: 2023-03-19 — End: 2023-03-19
  Administered 2023-03-19: 120 mg via INTRAVENOUS
  Administered 2023-03-19: 50 mg via INTRAVENOUS
  Administered 2023-03-19: 30 mg via INTRAVENOUS

## 2023-03-19 MED ORDER — LACTATED RINGERS IV SOLN: INTRAVENOUS | Status: DC | PRN

## 2023-03-19 MED ORDER — DEXTROSE 50 % IV SOLN: 25.0000 mL | Freq: Once | INTRAVENOUS | Status: AC

## 2023-03-19 MED ORDER — DEXTROSE 50 % IV SOLN
INTRAVENOUS | Status: AC
  Administered 2023-03-19: 25 mL via INTRAVENOUS
  Filled 2023-03-19: qty 50

## 2023-03-19 NOTE — H&P (Signed)
Primary Care Physician:  Kirstie Peri, MD Primary Gastroenterologist:  Dr. Marletta Lor  Pre-Procedure History & Physical: HPI:  Jodi Ray is a 62 y.o. female is here for an EGD to be performed for nausea/vomiting and abdominal pain  Past Medical History:  Diagnosis Date   Adult RDS (HCC)    Anemia    Brain aneurysm    frontal lobe   CAD (coronary artery disease)    a. 02/2018: s/p DES to Proximal LAD with residual 20% RCA stenosis.   Chronic pain    Diabetes mellitus without complication (HCC)    Headache    History of left bundle branch block (LBBB)    HTN (hypertension)    Hx of cardiovascular stress test 10/2016   intermediate risk study   Hypothyroidism    Neuromuscular disorder (HCC)    Neuropathy    Obesity    OSA on CPAP 03/10/2015   Renal insufficiency    Retinopathy    Sleep apnea    Varicose veins of both lower extremities     Past Surgical History:  Procedure Laterality Date   BIOPSY  11/14/2016   Procedure: BIOPSY;  Surgeon: West Bali, MD;  Location: [facility];  Service: Endoscopy;;  colon gastric duodenal   BREAST EXCISIONAL BIOPSY Right    BREAST SURGERY Left 1994   Lumpectomy   COLONOSCOPY WITH PROPOFOL N/A 11/14/2016   Dr. Darrick Penna: Moderately redundant rectosigmoid colon. Random colon biopsies benign. Internal hemorrhoids. Surveillance colonoscopy in 5 years.   COLONOSCOPY WITH PROPOFOL N/A 04/20/2022   Procedure: COLONOSCOPY WITH PROPOFOL;  Surgeon: Lanelle Bal, DO;  Location: [facility];  Service: Endoscopy;  Laterality: N/A;  12:30am   CORONARY STENT INTERVENTION N/A 03/22/2018   Procedure: CORONARY STENT INTERVENTION;  Surgeon: Kathleene Hazel, MD;  Location: [facility];  Service: Cardiovascular;  Laterality: N/A;   ESOPHAGOGASTRODUODENOSCOPY (EGD) WITH PROPOFOL N/A 11/14/2016   Dr. Darrick Penna: Esophagus normal. Moderate gastritis, few gastric polyps. Duodenal biopsies negative. Fundic gland polyp gastric polyp area  no H pylori.   FOOT SURGERY     LEFT HEART CATH AND CORONARY ANGIOGRAPHY N/A 03/22/2018   Procedure: LEFT HEART CATH AND CORONARY ANGIOGRAPHY;  Surgeon: Kathleene Hazel, MD;  Location: [facility];  Service: Cardiovascular;  Laterality: N/A;   LEG SURGERY Right    POLYPECTOMY  04/20/2022   Procedure: POLYPECTOMY;  Surgeon: Lanelle Bal, DO;  Location: [facility];  Service: Endoscopy;;   REPLACEMENT TOTAL KNEE Right    SPINAL CORD STIMULATOR IMPLANT      Prior to Admission medications   Medication Sig Start Date End Date Taking? Authorizing Provider  aspirin EC 81 MG tablet Take 1 tablet (81 mg total) by mouth daily. Swallow whole. 07/04/21  Yes Antoine Poche, MD  atorvastatin (LIPITOR) 80 MG tablet TAKE 1 TABLET BY MOUTH ONCE DAILY AT  6PM 08/18/22  Yes Branch, Dorothe Pea, MD  calcitRIOL (ROCALTROL) 0.25 MCG capsule Take 0.25 mcg by mouth every other day. 12/01/21  Yes [provider]  carvedilol (COREG) 3.125 MG tablet Take by mouth. 10/18/20  Yes [provider]  Continuous Blood Gluc Sensor (DEXCOM G6 SENSOR) MISC 1 Device by Other route as directed.   Yes [provider]  desvenlafaxine (PRISTIQ) 50 MG 24 hr tablet Take 25 mg by mouth at bedtime.   Yes [provider]  fish oil-omega-3 fatty acids 1000 MG capsule Take 1 g by mouth in the morning and  at bedtime.   Yes [provider]  furosemide (LASIX) 20 MG tablet Take 20 mg by mouth. Take 2 tablets in the morning and 1 tablet in the evening 06/20/22  Yes [provider]  furosemide (LASIX) 40 MG tablet Take 20-40 mg by mouth 2 (two) times daily. 40 MG AM, 20 MG PM   Yes [provider]  Insulin Human (INSULIN PUMP) SOLN Inject into the skin continuous.    Yes [provider]  Insulin Pen Needle (PEN NEEDLES 31GX5/16") 31G X 8 MM MISC  11/18/19  Yes [provider]  JARDIANCE 10 MG TABS tablet Take 5 mg by mouth every other day. 07/15/21   Yes [provider]  KERENDIA 10 MG TABS Take 1 tablet by mouth daily. 02/06/22  Yes [provider]  levothyroxine (SYNTHROID) 112 MCG tablet Take 112 mcg by mouth daily before breakfast.   Yes [provider]  metFORMIN (GLUCOPHAGE) 500 MG tablet Take 1 tablet (500 mg total) by mouth 2 (two) times daily with a meal. 11/19/19  Yes Vassie Loll, MD  NOVOLOG 100 UNIT/ML injection Inject into the skin continuous. In insulin pump 02/24/18  Yes [provider]  pantoprazole (PROTONIX) 40 MG tablet Take 1 tablet (40 mg total) by mouth 2 (two) times daily before a meal. 02/08/23  Yes Ermalinda Memos S, PA-C  triamcinolone ointment (KENALOG) 0.1 % Apply 1 Application topically 2 (two) times daily. 09/28/22  Yes Ambs, Norvel Richards, FNP  EPINEPHrine 0.3 mg/0.3 mL IJ SOAJ injection Inject 0.3 mg into the muscle as needed for anaphylaxis. 08/03/21   Alfonse Spruce, MD  loratadine (CLARITIN) 10 MG tablet Take 2 tablets (20 mg total) by mouth daily. Patient not taking: Reported on 12/28/2022 04/14/22 07/13/22  Alfonse Spruce, MD  nitroGLYCERIN (NITROSTAT) 0.4 MG SL tablet Place 1 tablet (0.4 mg total) under the tongue every 5 (five) minutes as needed for chest pain. 01/26/22   Antoine Poche, MD  Prucalopride Succinate (MOTEGRITY) 2 MG TABS Take 1 tablet (2 mg total) by mouth daily at 6 (six) AM. 02/15/23   Letta Median, PA-C    Allergies as of 02/20/2023 - Review Complete 02/08/2023  Allergen Reaction Noted   Other Nausea And Vomiting and Other (See Comments) 08/04/2015   Fentanyl Nausea And Vomiting and Other (See Comments) 11/09/2016    Family History  Problem Relation Age of Onset   Cancer Mother 60       colon   Cancer Father        renal cell   Colon cancer Father    Breast cancer Sister    Cancer Sister        primary brain, and primary breast too   Breast cancer Sister    Cancer Sister        breast    Breast cancer Sister    Breast cancer  Sister    Cancer Brother        kidney and liver   Sleep apnea Neg Hx     Social History   Socioeconomic History   Marital status: Widowed    Spouse name: Not on file   Number of children: 0   Years of education: Not on file   Highest education level: Bachelor's degree (e.g., BA, AB, BS)  Occupational History   Occupation: Retired  Tobacco Use   Smoking status: Never   Smokeless tobacco: Never  Vaping Use   Vaping status: Never Used  Substance and  Sexual Activity   Alcohol use: No    Alcohol/week: 0.0 standard drinks of alcohol   Drug use: No   Sexual activity: Not Currently    Partners: Male  Other Topics Concern   Not on file  Social History Narrative   Drinks about 1 cup coffee day.   Cardiac rehab 3x week   One level home with significant other   Bachelors in Accounting      Right handed   No children   widow   Social Determinants of Health   Financial Resource Strain: Not on file  Food Insecurity: Not on file  Transportation Needs: Not on file  Physical Activity: Not on file  Stress: Not on file  Social Connections: Not on file  Intimate Partner Violence: Not on file    Review of Systems: General: Negative for fever, chills, fatigue, weakness. Eyes: Negative for vision changes.  ENT: Negative for hoarseness, difficulty swallowing , nasal congestion. CV: Negative for chest pain, angina, palpitations, dyspnea on exertion, peripheral edema.  Respiratory: Negative for dyspnea at rest, dyspnea on exertion, cough, sputum, wheezing.  GI: See history of present illness. GU:  Negative for dysuria, hematuria, urinary incontinence, urinary frequency, nocturnal urination.  MS: Negative for joint pain, low back pain.  Derm: Negative for rash or itching.  Neuro: Negative for weakness, abnormal sensation, seizure, frequent headaches, memory loss, confusion.  Psych: Negative for anxiety, depression Endo: Negative for unusual weight change.  Heme: Negative for  bruising or bleeding. Allergy: Negative for rash or hives.  Physical Exam: Vital signs in last 24 hours: Temp:  [98.3 F (36.8 C)] 98.3 F (36.8 C) (08/19 0839) Pulse Rate:  [74] 74 (08/19 0839) Resp:  [14] 14 (08/19 0839) BP: (161)/(74) 161/74 (08/19 0839) SpO2:  [99 %] 99 % (08/19 0839) Weight:  [117.3 kg] 117.3 kg (08/19 0839)   General:   Alert,  Well-developed, well-nourished, pleasant and cooperative in NAD Head:  Normocephalic and atraumatic. Eyes:  Sclera clear, no icterus.   Conjunctiva pink. Ears:  Normal auditory acuity. Nose:  No deformity, discharge,  or lesions. Msk:  Symmetrical without gross deformities. Normal posture. Extremities:  Without clubbing or edema. Neurologic:  Alert and  oriented x4;  grossly normal neurologically. Skin:  Intact without significant lesions or rashes. Psych:  Alert and cooperative. Normal mood and affect.   Impression/Plan: Jodi Ray is here for an EGD to be performed for nausea/vomiting and abdominal pain  Risks, benefits, limitations, imponderables and alternatives regarding procedure have been reviewed with the patient. Questions have been answered. All parties agreeable.

## 2023-03-19 NOTE — Transfer of Care (Signed)
Immediate Anesthesia Transfer of Care Note  Patient: Jodi Ray  Procedure(s) Performed: ESOPHAGOGASTRODUODENOSCOPY (EGD) WITH PROPOFOL BIOPSY  Patient Location: Short Stay  Anesthesia Type:General  Level of Consciousness: awake  Airway & Oxygen Therapy: Patient Spontanous Breathing  Post-op Assessment: Report given to RN and Post -op Vital signs reviewed and stable  Post vital signs: Reviewed and stable  Last Vitals:  Vitals Value Taken Time  BP    Temp    Pulse    Resp    SpO2      Last Pain:  Vitals:   03/19/23 0940  TempSrc:   PainSc: 6          Complications: No notable events documented.

## 2023-03-19 NOTE — Discharge Instructions (Signed)
EGD Discharge instructions Please read the instructions outlined below and refer to this sheet in the next few weeks. These discharge instructions provide you with general information on caring for yourself after you leave the hospital. Your doctor may also give you specific instructions. While your treatment has been planned according to the most current medical practices available, unavoidable complications occasionally occur. If you have any problems or questions after discharge, please call your doctor. ACTIVITY You may resume your regular activity but move at a slower pace for the next 24 hours.  Take frequent rest periods for the next 24 hours.  Walking will help expel (get rid of) the air and reduce the bloated feeling in your abdomen.  No driving for 24 hours (because of the anesthesia (medicine) used during the test).  You may shower.  Do not sign any important legal documents or operate any machinery for 24 hours (because of the anesthesia used during the test).  NUTRITION Drink plenty of fluids.  You may resume your normal diet.  Begin with a light meal and progress to your normal diet.  Avoid alcoholic beverages for 24 hours or as instructed by your caregiver.  MEDICATIONS You may resume your normal medications unless your caregiver tells you otherwise.  WHAT YOU CAN EXPECT TODAY You may experience abdominal discomfort such as a feeling of fullness or "gas" pains.  FOLLOW-UP Your doctor will discuss the results of your test with you.  SEEK IMMEDIATE MEDICAL ATTENTION IF ANY OF THE FOLLOWING OCCUR: Excessive nausea (feeling sick to your stomach) and/or vomiting.  Severe abdominal pain and distention (swelling).  Trouble swallowing.  Temperature over 101 F (37.8 C).  Rectal bleeding or vomiting of blood.   Your EGD revealed mild amount inflammation in your stomach.  I took biopsies of this to rule out infection with a bacteria called H. pylori.  Await pathology results, my  office will contact you.  Esophagus and small bowel appeared normal.   Continue on pantoprazole twice daily.   Consider gastric emptying study.   Follow up with GI in 2-3 months   I hope you have a great rest of your week!  Hennie Duos. Marletta Lor, D.O. Gastroenterology and Hepatology [facility]

## 2023-03-19 NOTE — Op Note (Signed)
[facility] Patient Name: Jodi Ray Procedure Date: 03/19/2023 9:28 AM MRN: [id number] Date of Birth: 04/23/1961 Attending MD: Hennie Duos. Marletta Lor , [place], [id number] CSN: [id number] Age: 62 Admit Type: Outpatient Procedure:                Upper GI endoscopy Indications:              Upper abdominal pain, Nausea with vomiting Providers:                Hennie Duos. Marletta Lor, DO, Crystal Page, Durwin Glaze Tech, Technician Referring MD:              Medicines:                See the Anesthesia note for documentation of the                            administered medications Complications:            No immediate complications. Estimated Blood Loss:     Estimated blood loss was minimal. Procedure:                Pre-Anesthesia Assessment:                           - The anesthesia plan was to use monitored                            anesthesia care (MAC).                           After obtaining informed consent, the endoscope was                            passed under direct vision. Throughout the                            procedure, the patient's blood pressure, pulse, and                            oxygen saturations were monitored continuously. The                            GIF-H190 ([id number]) scope was introduced through the                            mouth, and advanced to the second part of duodenum.                            The upper GI endoscopy was accomplished without                            difficulty. The patient tolerated the procedure                            well.  Scope In: 9:44:27 AM Scope Out: 9:49:05 AM Total Procedure Duration: 0 hours 4 minutes 38 seconds  Findings:      The Z-line was regular and was found 40 cm from the incisors.      Patchy mild inflammation characterized by erythema was found in the       gastric body and in the gastric antrum. Biopsies were taken with a cold       forceps for Helicobacter pylori  testing.      The duodenal bulb, first portion of the duodenum and second portion of       the duodenum were normal.      Multiple small fundic gland polyps with no bleeding and no stigmata of       recent bleeding were found in the gastric body. Impression:               - Z-line regular, 40 cm from the incisors.                           - Gastritis. Biopsied.                           - Normal duodenal bulb, first portion of the                            duodenum and second portion of the duodenum.                           - Multiple gastric polyps. Moderate Sedation:      Per Anesthesia Care Recommendation:           - Patient has a contact number available for                            emergencies. The signs and symptoms of potential                            delayed complications were discussed with the                            patient. Return to normal activities tomorrow.                            Written discharge instructions were provided to the                            patient.                           - Resume previous diet.                           - Continue present medications.                           - Await pathology results.                           - Use Protonix (  pantoprazole) 40 mg PO BID.                           - Return to GI clinic in 3 months.                           - Consider GES Procedure Code(s):        --- Professional ---                           [phone], Esophagogastroduodenoscopy, flexible,                            transoral; with biopsy, single or multiple Diagnosis Code(s):        --- Professional ---                           K29.70, Gastritis, unspecified, without bleeding                           K31.7, Polyp of stomach and duodenum                           R10.10, Upper abdominal pain, unspecified                           R11.2, Nausea with vomiting, unspecified CPT copyright 2022 American Medical Association. All rights  reserved. The codes documented in this report are preliminary and upon coder review may  be revised to meet current compliance requirements. Hennie Duos. Marletta Lor, DO Hennie Duos. Marletta Lor, DO 03/19/2023 9:53:22 AM This report has been signed electronically. Number of Addenda: 0

## 2023-03-19 NOTE — Telephone Encounter (Signed)
Patient came in to  the office to pick up paperwork and she said she needed to schedule a paracentesis test and would she need to see the PA again before having that done.  Please advise.

## 2023-03-19 NOTE — Anesthesia Procedure Notes (Signed)
Date/Time: 03/19/2023 9:40 AM  Performed by: Julian Reil, CRNAPre-anesthesia Checklist: Patient identified, Emergency Drugs available, Suction available and Patient being monitored Patient Re-evaluated:Patient Re-evaluated prior to induction Oxygen Delivery Method: Nasal cannula Induction Type: IV induction Placement Confirmation: positive ETCO2 Comments: Optiflow High Flow [place] O2 used.

## 2023-03-19 NOTE — Anesthesia Preprocedure Evaluation (Signed)
Anesthesia Evaluation  Patient identified by MRN, date of birth, ID band Patient awake    Reviewed: Allergy & Precautions, H&P , NPO status , Patient's Chart, lab work & pertinent test results, reviewed documented beta blocker date and time   Airway Mallampati: II  TM Distance: >3 FB Neck ROM: full    Dental no notable dental hx.    Pulmonary neg pulmonary ROS, sleep apnea    Pulmonary exam normal breath sounds clear to auscultation       Cardiovascular Exercise Tolerance: Good hypertension, + angina  + CAD and + Cardiac Stents   Rhythm:regular Rate:Normal     Neuro/Psych  Headaches  Neuromuscular disease negative neurological ROS  negative psych ROS   GI/Hepatic negative GI ROS, Neg liver ROS,,,  Endo/Other  diabetesHypothyroidism  Morbid obesity  Renal/GU Renal diseasenegative Renal ROS  negative genitourinary   Musculoskeletal   Abdominal   Peds  Hematology negative hematology ROS (+) Blood dyscrasia, anemia   Anesthesia Other Findings   Reproductive/Obstetrics negative OB ROS                             Anesthesia Physical Anesthesia Plan  ASA: 3  Anesthesia Plan: General   Post-op Pain Management:    Induction:   PONV Risk Score and Plan: Propofol infusion  Airway Management Planned:   Additional Equipment:   Intra-op Plan:   Post-operative Plan:   Informed Consent: I have reviewed the patients History and Physical, chart, labs and discussed the procedure including the risks, benefits and alternatives for the proposed anesthesia with the patient or authorized representative who has indicated his/her understanding and acceptance.     Dental Advisory Given  Plan Discussed with: CRNA  Anesthesia Plan Comments:        Anesthesia Quick Evaluation

## 2023-03-20 ENCOUNTER — Encounter (HOSPITAL_COMMUNITY): Payer: Self-pay | Admitting: Internal Medicine

## 2023-03-20 LAB — SURGICAL PATHOLOGY

## 2023-03-20 NOTE — Telephone Encounter (Signed)
LMOM for pt to call office  

## 2023-03-20 NOTE — Anesthesia Postprocedure Evaluation (Signed)
Anesthesia Post Note  Patient: Verleen Stigall Weinert  Procedure(s) Performed: ESOPHAGOGASTRODUODENOSCOPY (EGD) WITH PROPOFOL BIOPSY  Patient location during evaluation: Phase II Anesthesia Type: General Level of consciousness: awake Pain management: pain level controlled Vital Signs Assessment: post-procedure vital signs reviewed and stable Respiratory status: spontaneous breathing and respiratory function stable Cardiovascular status: blood pressure returned to baseline and stable Postop Assessment: no headache and no apparent nausea or vomiting Anesthetic complications: no Comments: Late entry   No notable events documented.   Last Vitals:  Vitals:   03/19/23 0839 03/19/23 0953  BP: (!) 161/74 124/61  Pulse: 74 71  Resp: 14 15  Temp: 36.8 C 36.8 C  SpO2: 99% 98%    Last Pain:  Vitals:   03/19/23 0953  TempSrc:   PainSc: 0-No pain                 Windell Norfolk

## 2023-03-20 NOTE — Telephone Encounter (Signed)
Pt was scheduled for [facility] 8/26 @ 11

## 2023-03-20 NOTE — Telephone Encounter (Signed)
I am sure she meant to say gastroparesis test rather than paracentesis as we have been discussing this at her prior office visits.  She had EGD yesterday and needs to be scheduled for a follow-up visit in the office.

## 2023-03-20 NOTE — Telephone Encounter (Signed)
Noted. Tammy C. Made OV for Monday

## 2023-03-20 NOTE — Telephone Encounter (Signed)
Noted  

## 2023-03-21 ENCOUNTER — Ambulatory Visit: Payer: Self-pay

## 2023-03-21 ENCOUNTER — Other Ambulatory Visit: Payer: Self-pay | Admitting: Gastroenterology

## 2023-03-21 DIAGNOSIS — J309 Allergic rhinitis, unspecified: Secondary | ICD-10-CM | POA: Diagnosis not present

## 2023-03-21 DIAGNOSIS — K5904 Chronic idiopathic constipation: Secondary | ICD-10-CM

## 2023-03-21 MED ORDER — MOTEGRITY 2 MG PO TABS
2.0000 mg | ORAL_TABLET | Freq: Every day | ORAL | 3 refills | Status: DC
Start: 2023-03-21 — End: 2023-03-26

## 2023-03-22 DIAGNOSIS — E109 Type 1 diabetes mellitus without complications: Secondary | ICD-10-CM | POA: Diagnosis not present

## 2023-03-22 DIAGNOSIS — E785 Hyperlipidemia, unspecified: Secondary | ICD-10-CM | POA: Diagnosis not present

## 2023-03-22 DIAGNOSIS — E118 Type 2 diabetes mellitus with unspecified complications: Secondary | ICD-10-CM | POA: Diagnosis not present

## 2023-03-22 DIAGNOSIS — I10 Essential (primary) hypertension: Secondary | ICD-10-CM | POA: Diagnosis not present

## 2023-03-22 DIAGNOSIS — E1140 Type 2 diabetes mellitus with diabetic neuropathy, unspecified: Secondary | ICD-10-CM | POA: Diagnosis not present

## 2023-03-22 DIAGNOSIS — Z794 Long term (current) use of insulin: Secondary | ICD-10-CM | POA: Diagnosis not present

## 2023-03-22 DIAGNOSIS — Z9641 Presence of insulin pump (external) (internal): Secondary | ICD-10-CM | POA: Diagnosis not present

## 2023-03-22 DIAGNOSIS — E039 Hypothyroidism, unspecified: Secondary | ICD-10-CM | POA: Diagnosis not present

## 2023-03-22 DIAGNOSIS — E669 Obesity, unspecified: Secondary | ICD-10-CM | POA: Diagnosis not present

## 2023-03-22 DIAGNOSIS — I2510 Atherosclerotic heart disease of native coronary artery without angina pectoris: Secondary | ICD-10-CM | POA: Diagnosis not present

## 2023-03-22 DIAGNOSIS — M1460 Charcot's joint, unspecified site: Secondary | ICD-10-CM | POA: Diagnosis not present

## 2023-03-22 DIAGNOSIS — E11319 Type 2 diabetes mellitus with unspecified diabetic retinopathy without macular edema: Secondary | ICD-10-CM | POA: Diagnosis not present

## 2023-03-24 NOTE — Progress Notes (Unsigned)
Referring Provider: Kirstie Peri, MD Primary Care Physician:  Kirstie Peri, MD Primary GI Physician: Dr. Marletta Lor  Chief Complaint  Patient presents with   Follow-up    Follow up. Constipation still. Not taking Motegrity, can't afford it     HPI:   Jodi Ray is a 62 y.o. female  with a history of anemia, brain aneurysm, CAD s/p stent in 2019, diabetes, chronic pain, HTN, hypothyroidism, OSA on CPAP, and CKD, adenomatous colon polyps due for surveillance in 2028, hemorrhoids, constipation, chronic upper abdominal pain on daily PPI, presenting today for follow-up of constipation, abdominal pain, and nausea.   Last seen in the office 02/08/2023.  She was taking Linzess 290 mcg every other day due to daily dosing causing abdominal pain.  Also taking Metamucil daily, but not having a bowel movement for 8 days.  Also with postprandial nausea and upper abdominal pain that would move throughout.  Plan included stopping Linzess, complete MiraLAX bowel prep, then start Trulance 3 mg daily, increase pantoprazole to 40 mg twice daily, proceed with EGD.  Trulance alone did not control patient's constipation and she was ultimately advised to add MiraLAX twice daily, then Motegrity was also added due to patient reporting ongoing constipation.  EGD 03/19/2023: Gastritis biopsied, multiple gastric polyps.  Pathology with gastric antral/oxyntic mucosa without significant diagnostic alteration.  No H. pylori.  Today:  Constipation: Has previously tried prunes, Metamucil, senna, Ex-Lax without improvement.  Linzess caused worsening abdominal pain.  Stopped Trulance because it wasn't helpful.  Reports Motegrity was going to cost 200+ dollars, so she was not able to afford this. Currently having BMs every 8-9 days. She is taking MiraLAX twice daily, metamucil, and 3 senna daily. If she takes 2 dulcolax her bowels will finally move.   Nausea: Continues postprandially, but no vomiting. Eating a lot of  fruit and vegetables. Doesn't want anything for nausea. Has early satiety. No heartburn. Taking pantoprazole twice daily.   Abdominal pain: No upper abdominal pain. Still with LLQ abdominal pain.  No worsening symptoms. Gets better with a BM.     Prior GI evaluation: EGD April 2018 with moderate gastritis and benign gastric polyp. Also with normal duodenal biopsies.    Colonoscopy in 2018 with evidence of internal hemorrhoids, redundant left colon, and negative random colon biopsies for microscopic colitis.    Colonoscopy completed 04/20/2022 with nonbleeding internal hemorrhoids, 3 mm cecal polyp removed, otherwise normal exam. Recommended 5-year surveillance due to family history of colon cancer. Pathology with tubular adenoma.    HIDA with normal gallbladder EF in 2018, but patient reported pain after Ensure ingestion. Initially saw surgeon, Dr. Marcha Solders, about possible cholecystectomy and scheduled surgery, but later cancelled as PCP did not think symptoms gallbladder related.     Abdominal ultrasound in 2018 with hepatic steatosis, mildly distended gallbladder, sonographic Murphy sign.   CT A/P with contrast in 2018 with fairly large amount of stool in the colon, small hiatal hernia, minimal ventral hernia containing fat only.   Past Medical History:  Diagnosis Date   Adult RDS (HCC)    Anemia    Brain aneurysm    frontal lobe   CAD (coronary artery disease)    a. 02/2018: s/p DES to Proximal LAD with residual 20% RCA stenosis.   Chronic pain    Diabetes mellitus without complication (HCC)    Headache    History of left bundle branch block (LBBB)    HTN (hypertension)    Hx of cardiovascular  stress test 10/2016   intermediate risk study   Hypothyroidism    Neuromuscular disorder (HCC)    Neuropathy    Obesity    OSA on CPAP 03/10/2015   Renal insufficiency    Retinopathy    Sleep apnea    Varicose veins of both lower extremities     Past Surgical History:  Procedure  Laterality Date   BIOPSY  11/14/2016   Procedure: BIOPSY;  Surgeon: West Bali, MD;  Location: [facility];  Service: Endoscopy;;  colon gastric duodenal   BIOPSY  03/19/2023   Procedure: BIOPSY;  Surgeon: Lanelle Bal, DO;  Location: [facility];  Service: Endoscopy;;   BREAST EXCISIONAL BIOPSY Right    BREAST SURGERY Left 1994   Lumpectomy   COLONOSCOPY WITH PROPOFOL N/A 11/14/2016   Dr. Darrick Penna: Moderately redundant rectosigmoid colon. Random colon biopsies benign. Internal hemorrhoids. Surveillance colonoscopy in 5 years.   COLONOSCOPY WITH PROPOFOL N/A 04/20/2022   Procedure: COLONOSCOPY WITH PROPOFOL;  Surgeon: Lanelle Bal, DO;  Location: [facility];  Service: Endoscopy;  Laterality: N/A;  12:30am   CORONARY STENT INTERVENTION N/A 03/22/2018   Procedure: CORONARY STENT INTERVENTION;  Surgeon: Kathleene Hazel, MD;  Location: [facility];  Service: Cardiovascular;  Laterality: N/A;   ESOPHAGOGASTRODUODENOSCOPY (EGD) WITH PROPOFOL N/A 11/14/2016   Dr. Darrick Penna: Esophagus normal. Moderate gastritis, few gastric polyps. Duodenal biopsies negative. Fundic gland polyp gastric polyp area no H pylori.   ESOPHAGOGASTRODUODENOSCOPY (EGD) WITH PROPOFOL N/A 03/19/2023   Procedure: ESOPHAGOGASTRODUODENOSCOPY (EGD) WITH PROPOFOL;  Surgeon: Lanelle Bal, DO;  Location: [facility];  Service: Endoscopy;  Laterality: N/A;  10:15 am, asa 3   FOOT SURGERY     LEFT HEART CATH AND CORONARY ANGIOGRAPHY N/A 03/22/2018   Procedure: LEFT HEART CATH AND CORONARY ANGIOGRAPHY;  Surgeon: Kathleene Hazel, MD;  Location: [facility];  Service: Cardiovascular;  Laterality: N/A;   LEG SURGERY Right    POLYPECTOMY  04/20/2022   Procedure: POLYPECTOMY;  Surgeon: Lanelle Bal, DO;  Location: [facility];  Service: Endoscopy;;   REPLACEMENT TOTAL KNEE Right    SPINAL CORD STIMULATOR IMPLANT      Current Outpatient Medications  Medication Sig Dispense Refill    aspirin EC 81 MG tablet Take 1 tablet (81 mg total) by mouth daily. Swallow whole. 90 tablet 3   atorvastatin (LIPITOR) 80 MG tablet TAKE 1 TABLET BY MOUTH ONCE DAILY AT  6PM 90 tablet 2   calcitRIOL (ROCALTROL) 0.25 MCG capsule Take 0.25 mcg by mouth every other day.     carvedilol (COREG) 3.125 MG tablet Take by mouth.     Continuous Blood Gluc Sensor (DEXCOM G6 SENSOR) MISC 1 Device by Other route as directed.     desvenlafaxine (PRISTIQ) 50 MG 24 hr tablet Take 25 mg by mouth at bedtime.     EPINEPHrine 0.3 mg/0.3 mL IJ SOAJ injection Inject 0.3 mg into the muscle as needed for anaphylaxis. 2 each 1   fish oil-omega-3 fatty acids 1000 MG capsule Take 1 g by mouth in the morning and at bedtime.     furosemide (LASIX) 20 MG tablet Take 20 mg by mouth. Take 2 tablets in the morning and 1 tablet in the evening     furosemide (LASIX) 40 MG tablet Take 20-40 mg by mouth 2 (two) times daily. 40 MG AM, 20 MG PM     Insulin Human (INSULIN PUMP) SOLN Inject into the skin  continuous.      Insulin Pen Needle (PEN NEEDLES 31GX5/16") 31G X 8 MM MISC      JARDIANCE 10 MG TABS tablet Take 5 mg by mouth every other day.     KERENDIA 10 MG TABS Take 1 tablet by mouth daily.     levothyroxine (SYNTHROID) 112 MCG tablet Take 112 mcg by mouth daily before breakfast.     lubiprostone (AMITIZA) 24 MCG capsule Take 1 capsule (24 mcg total) by mouth 2 (two) times daily with a meal. 60 capsule 3   metFORMIN (GLUCOPHAGE) 500 MG tablet Take 1 tablet (500 mg total) by mouth 2 (two) times daily with a meal.     nitroGLYCERIN (NITROSTAT) 0.4 MG SL tablet Place 1 tablet (0.4 mg total) under the tongue every 5 (five) minutes as needed for chest pain. 25 tablet 3   NOVOLOG 100 UNIT/ML injection Inject into the skin continuous. In insulin pump  0   triamcinolone ointment (KENALOG) 0.1 % Apply 1 Application topically 2 (two) times daily. 30 g 0   loratadine (CLARITIN) 10 MG tablet Take 2 tablets (20 mg total) by mouth daily.  (Patient not taking: Reported on 12/28/2022) 180 tablet 3   pantoprazole (PROTONIX) 40 MG tablet Take 1 tablet (40 mg total) by mouth 2 (two) times daily before a meal. 60 tablet 3   No current facility-administered medications for this visit.    Allergies as of 03/26/2023 - Review Complete 03/26/2023  Allergen Reaction Noted   Other Nausea And Vomiting and Other (See Comments) 08/04/2015   Fentanyl Nausea And Vomiting and Other (See Comments) 11/09/2016    Family History  Problem Relation Age of Onset   Cancer Mother 11       colon   Cancer Father        renal cell   Colon cancer Father    Breast cancer Sister    Cancer Sister        primary brain, and primary breast too   Breast cancer Sister    Cancer Sister        breast    Breast cancer Sister    Breast cancer Sister    Cancer Brother        kidney and liver   Sleep apnea Neg Hx     Social History   Socioeconomic History   Marital status: Widowed    Spouse name: Not on file   Number of children: 0   Years of education: Not on file   Highest education level: Bachelor's degree (e.g., BA, AB, BS)  Occupational History   Occupation: Retired  Tobacco Use   Smoking status: Never   Smokeless tobacco: Never  Vaping Use   Vaping status: Never Used  Substance and Sexual Activity   Alcohol use: No    Alcohol/week: 0.0 standard drinks of alcohol   Drug use: No   Sexual activity: Not Currently    Partners: Male  Other Topics Concern   Not on file  Social History Narrative   Drinks about 1 cup coffee day.   Cardiac rehab 3x week   One level home with significant other   Bachelors in Accounting      Right handed   No children   widow   Social Determinants of Health   Financial Resource Strain: Not on file  Food Insecurity: Not on file  Transportation Needs: Not on file  Physical Activity: Not on file  Stress: Not on file  Social Connections: Not  on file    Review of Systems: Gen: Denies fever, chills,  cold or flulike symptoms, presyncope, syncope. CV: Denies chest pain, palpitations. Resp: Denies dyspnea, cough. GI: See HPI Heme: See HPI  Physical Exam: BP 136/86 (BP Location: Left Arm, Patient Position: Sitting, Cuff Size: Large)   Pulse 78   Temp 97.8 F (36.6 C) (Temporal)   Ht 5\' 8"  (1.727 m)   Wt 258 lb (117 kg)   SpO2 95%   BMI 39.23 kg/m  General:   Alert and oriented. No distress noted. Pleasant and cooperative.  Head:  Normocephalic and atraumatic. Eyes:  Conjuctiva clear without scleral icterus. Heart:  S1, S2 present without murmurs appreciated. Lungs:  Clear to auscultation bilaterally. No wheezes, rales, or rhonchi. No distress.  Abdomen:  +BS, soft, and non-distended. + TTP in LLQ. No rebound or guarding. No HSM or masses noted. Msk:  Symmetrical without gross deformities. Normal posture. Extremities:  Without edema. Neurologic:  Alert and  oriented x4 Psych:  Normal mood and affect.    Assessment:  62 y.o. female  with a history of anemia, brain aneurysm, CAD s/p stent in 2019, diabetes, chronic pain, HTN, hypothyroidism, OSA on CPAP, and CKD, adenomatous colon polyps due for surveillance in 2028, hemorrhoids, constipation, chronic upper abdominal pain on daily PPI, presenting today for follow-up of constipation, abdominal pain, and nausea.   Constipation:  Chronic, but worsened in December 2023. Colonoscopy up-to-date in September 2023 with nonbleeding internal hemorrhoids, 3 mm tubular adenoma removed.  Symptoms are not adequately managed. Has previously tried prunes, Metamucil, senna, Ex-Lax without improvement.    Linzess caused worsening abdominal pain.  Trulance not helpful.  Currently taking MiraLAX twice daily, Metamucil, 3 senna daily, and only having a bowel movement every 8 or 9 days after taking 2 Dulcolax. Unable to afford Motegrity.  I will try her on Amitiza 24 mcg twice daily.   LLQ abdominal pain: Likely secondary to constipation as discussed  above.  CT A/P with contrast 12/29/2022 with no acute abnormalities.  Colonoscopy up-to-date.  Nausea/early satiety: Has history of chronic upper abdominal pain that had been controlled on daily PPI, but reported recurrent pain in December when nausea/vomiting/early satiety started.  PPI was increased to twice daily in July.  EGD recently completed in August with gastritis with benign biopsies.  Patient reports upper abdominal pain and vomiting has resolved, she continues with nausea and early satiety.  Query whether she has underlying gastroparesis in the setting of diabetes.  Will pursue gastric emptying study.  If gastric emptying study is normal, will need to consider reevaluating for biliary etiology.  I do note HIDA scan in 2018 that was normal, but patient reporting pain after Ensure ingestion.   Plan:  Gastric emptying study Continue pantoprazole 40 mg twice daily. Start Amitiza 24 mcg twice daily with food. Continue MiraLAX 17 g twice daily. Follow-up in 3 months or sooner if needed.    Ermalinda Memos, PA-C [facility] 03/26/2023

## 2023-03-26 ENCOUNTER — Encounter: Payer: Self-pay | Admitting: *Deleted

## 2023-03-26 ENCOUNTER — Ambulatory Visit: Payer: Medicare Other | Admitting: Gastroenterology

## 2023-03-26 ENCOUNTER — Encounter: Payer: Self-pay | Admitting: Gastroenterology

## 2023-03-26 VITALS — BP 136/86 | HR 78 | Temp 97.8°F | Ht 68.0 in | Wt 258.0 lb

## 2023-03-26 DIAGNOSIS — R6881 Early satiety: Secondary | ICD-10-CM

## 2023-03-26 DIAGNOSIS — K5900 Constipation, unspecified: Secondary | ICD-10-CM | POA: Diagnosis not present

## 2023-03-26 DIAGNOSIS — R1032 Left lower quadrant pain: Secondary | ICD-10-CM | POA: Diagnosis not present

## 2023-03-26 DIAGNOSIS — R110 Nausea: Secondary | ICD-10-CM

## 2023-03-26 MED ORDER — PANTOPRAZOLE SODIUM 40 MG PO TBEC
40.0000 mg | DELAYED_RELEASE_TABLET | Freq: Two times a day (BID) | ORAL | 3 refills | Status: DC
Start: 2023-03-26 — End: 2023-10-09

## 2023-03-26 MED ORDER — LUBIPROSTONE 24 MCG PO CAPS
24.0000 ug | ORAL_CAPSULE | Freq: Two times a day (BID) | ORAL | 3 refills | Status: DC
Start: 2023-03-26 — End: 2023-08-08

## 2023-03-26 NOTE — Patient Instructions (Addendum)
We will arrange for you to have a gastric emptying study at [facility]  For constipation, start Amitiza 24 mcg twice daily with food.  If you have any trouble obtaining this medication, please let me know.  Otherwise, you can call with a progress report in a couple of weeks.  Continue MiraLAX 17 g twice daily.  Continue pantoprazole 40 mg twice daily.  Will plan to follow-up in the office in 3 months, but will be in contact with you with your gastric emptying study results.    It was great to see you again today!  Ermalinda Memos, PA-C [facility]

## 2023-03-28 ENCOUNTER — Ambulatory Visit (INDEPENDENT_AMBULATORY_CARE_PROVIDER_SITE_OTHER): Payer: Medicare Other

## 2023-03-28 ENCOUNTER — Other Ambulatory Visit (HOSPITAL_COMMUNITY)
Admission: RE | Admit: 2023-03-28 | Discharge: 2023-03-28 | Disposition: A | Discharge status: Home / Self Care | Payer: Medicare Other | Source: Ambulatory Visit | Attending: Nephrology | Admitting: Nephrology

## 2023-03-28 DIAGNOSIS — F339 Major depressive disorder, recurrent, unspecified: Secondary | ICD-10-CM | POA: Diagnosis not present

## 2023-03-28 DIAGNOSIS — N2581 Secondary hyperparathyroidism of renal origin: Secondary | ICD-10-CM | POA: Diagnosis not present

## 2023-03-28 DIAGNOSIS — N183 Chronic kidney disease, stage 3 unspecified: Secondary | ICD-10-CM | POA: Diagnosis not present

## 2023-03-28 DIAGNOSIS — Z299 Encounter for prophylactic measures, unspecified: Secondary | ICD-10-CM | POA: Diagnosis not present

## 2023-03-28 DIAGNOSIS — N189 Chronic kidney disease, unspecified: Secondary | ICD-10-CM | POA: Diagnosis not present

## 2023-03-28 DIAGNOSIS — J309 Allergic rhinitis, unspecified: Secondary | ICD-10-CM

## 2023-03-28 DIAGNOSIS — E109 Type 1 diabetes mellitus without complications: Secondary | ICD-10-CM | POA: Diagnosis not present

## 2023-03-28 DIAGNOSIS — E0842 Diabetes mellitus due to underlying condition with diabetic polyneuropathy: Secondary | ICD-10-CM | POA: Diagnosis not present

## 2023-03-28 LAB — CBC
HCT: 38.3 % (ref 36.0–46.0)
Hemoglobin: 12.2 g/dL (ref 12.0–15.0)
MCH: 28 pg (ref 26.0–34.0)
MCHC: 31.9 g/dL (ref 30.0–36.0)
MCV: 87.8 fL (ref 80.0–100.0)
Platelets: 233 10*3/uL (ref 150–400)
RBC: 4.36 MIL/uL (ref 3.87–5.11)
RDW: 16.2 % — ABNORMAL HIGH (ref 11.5–15.5)
WBC: 5.9 10*3/uL (ref 4.0–10.5)
nRBC: 0 % (ref 0.0–0.2)

## 2023-03-28 LAB — PROTEIN / CREATININE RATIO, URINE
Creatinine, Urine: 107 mg/dL
Protein Creatinine Ratio: 0.19 mg/mg{Cre} — ABNORMAL HIGH (ref 0.00–0.15)
Total Protein, Urine: 20 mg/dL

## 2023-03-28 LAB — RENAL FUNCTION PANEL
Albumin: 3.5 g/dL (ref 3.5–5.0)
Anion gap: 10 (ref 5–15)
BUN: 28 mg/dL — ABNORMAL HIGH (ref 8–23)
CO2: 28 mmol/L (ref 22–32)
Calcium: 8.9 mg/dL (ref 8.9–10.3)
Chloride: 100 mmol/L (ref 98–111)
Creatinine, Ser: 1.15 mg/dL — ABNORMAL HIGH (ref 0.44–1.00)
GFR, Estimated: 53 mL/min — ABNORMAL LOW (ref >60)
Glucose, Bld: 87 mg/dL (ref 70–99)
Phosphorus: 3.3 mg/dL (ref 2.5–4.6)
Potassium: 3.8 mmol/L (ref 3.5–5.1)
Sodium: 138 mmol/L (ref 135–145)

## 2023-03-29 ENCOUNTER — Ambulatory Visit (HOSPITAL_COMMUNITY)
Admission: RE | Admit: 2023-03-29 | Discharge: 2023-03-29 | Disposition: A | Discharge status: Home / Self Care | Payer: Medicare Other | Source: Ambulatory Visit | Attending: Gastroenterology | Admitting: Gastroenterology

## 2023-03-29 ENCOUNTER — Telehealth: Payer: Self-pay | Admitting: Gastroenterology

## 2023-03-29 DIAGNOSIS — K5900 Constipation, unspecified: Secondary | ICD-10-CM | POA: Insufficient documentation

## 2023-03-29 DIAGNOSIS — R110 Nausea: Secondary | ICD-10-CM | POA: Diagnosis not present

## 2023-03-29 DIAGNOSIS — R6881 Early satiety: Secondary | ICD-10-CM | POA: Diagnosis not present

## 2023-03-29 DIAGNOSIS — R1032 Left lower quadrant pain: Secondary | ICD-10-CM | POA: Diagnosis not present

## 2023-03-29 DIAGNOSIS — E109 Type 1 diabetes mellitus without complications: Secondary | ICD-10-CM | POA: Diagnosis not present

## 2023-03-29 MED ORDER — TECHNETIUM TC 99M SULFUR COLLOID
2.0000 | Freq: Once | INTRAVENOUS | Status: AC | PRN
  Administered 2023-03-29: 2 via ORAL

## 2023-03-29 NOTE — Telephone Encounter (Signed)
Patient left message that she had a parecentesis test today and now knows what the problem is and how we are going to fix it.  Wants to be called back.

## 2023-03-29 NOTE — Telephone Encounter (Signed)
Jodi Ray, I tried calling this number and was told they couldn't find the patient as a client. They transferred me to who they said would be the correct person, but then again said they couldn't find her as a client. Can you look into this and complete PA as usual since calling for verbal isn't working? Or you can call to see if you can get through.

## 2023-04-02 DIAGNOSIS — I5032 Chronic diastolic (congestive) heart failure: Secondary | ICD-10-CM | POA: Diagnosis not present

## 2023-04-02 DIAGNOSIS — E1122 Type 2 diabetes mellitus with diabetic chronic kidney disease: Secondary | ICD-10-CM | POA: Diagnosis not present

## 2023-04-02 DIAGNOSIS — N2581 Secondary hyperparathyroidism of renal origin: Secondary | ICD-10-CM | POA: Diagnosis not present

## 2023-04-02 DIAGNOSIS — I129 Hypertensive chronic kidney disease with stage 1 through stage 4 chronic kidney disease, or unspecified chronic kidney disease: Secondary | ICD-10-CM | POA: Diagnosis not present

## 2023-04-03 ENCOUNTER — Telehealth: Payer: Self-pay | Admitting: *Deleted

## 2023-04-03 ENCOUNTER — Encounter: Payer: Self-pay | Admitting: *Deleted

## 2023-04-03 ENCOUNTER — Other Ambulatory Visit: Payer: Self-pay | Admitting: *Deleted

## 2023-04-03 DIAGNOSIS — R110 Nausea: Secondary | ICD-10-CM

## 2023-04-03 NOTE — Telephone Encounter (Signed)
Noted-  Sent MyChart message

## 2023-04-03 NOTE — Telephone Encounter (Signed)
Called [employer] and after 23 minutes on the phone, was finally able to get Lubiprostone approved. Called [employer] and informed them it was approved. It should only be $10. Informed pt.

## 2023-04-03 NOTE — Telephone Encounter (Signed)
Courtney, I am not finding this documentation in the chart. I am not sure if this was attached to an old patient message thread, but we will need to be sure to create a new message for the new results and recommendations. I have recommended HIDA to further evaluate her symptoms.

## 2023-04-03 NOTE — Telephone Encounter (Signed)
Thank you :)

## 2023-04-03 NOTE — Telephone Encounter (Signed)
There is a message under imaging and yes it is new. I was responding to Chelsea.

## 2023-04-04 ENCOUNTER — Encounter: Payer: Self-pay | Admitting: *Deleted

## 2023-04-04 ENCOUNTER — Ambulatory Visit (INDEPENDENT_AMBULATORY_CARE_PROVIDER_SITE_OTHER): Payer: Medicare Other

## 2023-04-04 DIAGNOSIS — J309 Allergic rhinitis, unspecified: Secondary | ICD-10-CM

## 2023-04-05 ENCOUNTER — Ambulatory Visit: Payer: Medicare Other | Admitting: Gastroenterology

## 2023-04-05 ENCOUNTER — Encounter (HOSPITAL_COMMUNITY): Payer: Self-pay

## 2023-04-05 ENCOUNTER — Encounter (HOSPITAL_COMMUNITY)
Admission: RE | Admit: 2023-04-05 | Discharge: 2023-04-05 | Disposition: A | Discharge status: Home / Self Care | Payer: Medicare Other | Source: Ambulatory Visit | Attending: Gastroenterology | Admitting: Gastroenterology

## 2023-04-05 DIAGNOSIS — R110 Nausea: Secondary | ICD-10-CM | POA: Insufficient documentation

## 2023-04-05 DIAGNOSIS — R1013 Epigastric pain: Secondary | ICD-10-CM | POA: Diagnosis not present

## 2023-04-05 MED ORDER — TECHNETIUM TC 99M MEBROFENIN IV KIT
5.0000 | PACK | Freq: Once | INTRAVENOUS | Status: AC | PRN
  Administered 2023-04-05: 5.4 via INTRAVENOUS

## 2023-04-05 NOTE — Progress Notes (Signed)
[facility] - Clinic Note  04/09/2023     CHIEF COMPLAINT Patient presents for Retina Follow Up   HISTORY OF PRESENT ILLNESS: Jodi Ray is a 62 y.o. female who presents to the clinic today for:   HPI     Retina Follow Up   Patient presents with  Diabetic Retinopathy.  In both eyes.  This started 5 weeks ago.  I, the attending physician,  performed the HPI with the patient and updated documentation appropriately.        Comments   Patient here for 5 weeks retina follow up for PDR OU. Patient states vision doing good. OS still bother some. On Alprazolam prn. Took this am. Blood sugar 92 this am.       Last edited by Rennis Chris, MD on 04/09/2023  8:50 AM.     Pt states    Referring physician: Kirstie Peri, MD [address] [place],  [place] [address]  HISTORICAL INFORMATION:   Selected notes from the MEDICAL RECORD NUMBER Referred by Dr. [name] for concern of DME LEE:  Ocular Hx- PMH-    CURRENT MEDICATIONS: No current outpatient medications on file. (Ophthalmic Drugs)   No current facility-administered medications for this visit. (Ophthalmic Drugs)   Current Outpatient Medications (Other)  Medication Sig   aspirin EC 81 MG tablet Take 1 tablet (81 mg total) by mouth daily. Swallow whole.   atorvastatin (LIPITOR) 80 MG tablet TAKE 1 TABLET BY MOUTH ONCE DAILY AT  6PM   calcitRIOL (ROCALTROL) 0.25 MCG capsule Take 0.25 mcg by mouth every other day.   carvedilol (COREG) 3.125 MG tablet Take by mouth.   Continuous Blood Gluc Sensor (DEXCOM G6 SENSOR) MISC 1 Device by Other route as directed.   desvenlafaxine (PRISTIQ) 50 MG 24 hr tablet Take 25 mg by mouth at bedtime.   EPINEPHrine 0.3 mg/0.3 mL IJ SOAJ injection Inject 0.3 mg into the muscle as needed for anaphylaxis.   fish oil-omega-3 fatty acids 1000 MG capsule Take 1 g by mouth in the morning and at bedtime.   furosemide (LASIX) 20 MG tablet Take 20 mg by mouth. Take 2 tablets in the  morning and 1 tablet in the evening   furosemide (LASIX) 40 MG tablet Take 20-40 mg by mouth 2 (two) times daily. 40 MG AM, 20 MG PM   Insulin Human (INSULIN PUMP) SOLN Inject into the skin continuous.    Insulin Pen Needle (PEN NEEDLES 31GX5/16") 31G X 8 MM MISC    JARDIANCE 10 MG TABS tablet Take 5 mg by mouth every other day.   KERENDIA 10 MG TABS Take 1 tablet by mouth daily.   levothyroxine (SYNTHROID) 112 MCG tablet Take 112 mcg by mouth daily before breakfast.   lubiprostone (AMITIZA) 24 MCG capsule Take 1 capsule (24 mcg total) by mouth 2 (two) times daily with a meal.   metFORMIN (GLUCOPHAGE) 500 MG tablet Take 1 tablet (500 mg total) by mouth 2 (two) times daily with a meal.   nitroGLYCERIN (NITROSTAT) 0.4 MG SL tablet Place 1 tablet (0.4 mg total) under the tongue every 5 (five) minutes as needed for chest pain.   NOVOLOG 100 UNIT/ML injection Inject into the skin continuous. In insulin pump   pantoprazole (PROTONIX) 40 MG tablet Take 1 tablet (40 mg total) by mouth 2 (two) times daily before a meal.   triamcinolone ointment (KENALOG) 0.1 % Apply 1 Application topically 2 (two) times daily.   loratadine (CLARITIN) 10 MG  tablet Take 2 tablets (20 mg total) by mouth daily. (Patient not taking: Reported on 12/28/2022)   No current facility-administered medications for this visit. (Other)   REVIEW OF SYSTEMS: ROS   Positive for: Endocrine, Cardiovascular, Eyes Negative for: Constitutional, Gastrointestinal, Neurological, Skin, Genitourinary, Musculoskeletal, HENT, Respiratory, Psychiatric, Allergic/Imm, Heme/Lymph Last edited by Laddie Aquas, COA on 04/09/2023  8:06 AM.        ALLERGIES Allergies  Allergen Reactions   Other Nausea And Vomiting and Other (See Comments)    Opiates Alters mental status diarrhea   Fentanyl Nausea And Vomiting and Other (See Comments)    Nausea, vomiting, loss of consciousness, requiring reversal   PAST MEDICAL HISTORY Past Medical History:   Diagnosis Date   Adult RDS (HCC)    Anemia    Brain aneurysm    frontal lobe   CAD (coronary artery disease)    a. 02/2018: s/p DES to Proximal LAD with residual 20% RCA stenosis.   Chronic pain    Diabetes mellitus without complication (HCC)    Headache    History of left bundle branch block (LBBB)    HTN (hypertension)    Hx of cardiovascular stress test 10/2016   intermediate risk study   Hypothyroidism    Neuromuscular disorder (HCC)    Neuropathy    Obesity    OSA on CPAP 03/10/2015   Renal insufficiency    Retinopathy    Sleep apnea    Varicose veins of both lower extremities    Past Surgical History:  Procedure Laterality Date   BIOPSY  11/14/2016   Procedure: BIOPSY;  Surgeon: West Bali, MD;  Location: [facility];  Service: Endoscopy;;  colon gastric duodenal   BIOPSY  03/19/2023   Procedure: BIOPSY;  Surgeon: Lanelle Bal, DO;  Location: [facility];  Service: Endoscopy;;   BREAST EXCISIONAL BIOPSY Right    BREAST SURGERY Left 1994   Lumpectomy   COLONOSCOPY WITH PROPOFOL N/A 11/14/2016   Dr. Darrick Penna: Moderately redundant rectosigmoid colon. Random colon biopsies benign. Internal hemorrhoids. Surveillance colonoscopy in 5 years.   COLONOSCOPY WITH PROPOFOL N/A 04/20/2022   Procedure: COLONOSCOPY WITH PROPOFOL;  Surgeon: Lanelle Bal, DO;  Location: [facility];  Service: Endoscopy;  Laterality: N/A;  12:30am   CORONARY STENT INTERVENTION N/A 03/22/2018   Procedure: CORONARY STENT INTERVENTION;  Surgeon: Kathleene Hazel, MD;  Location: [facility];  Service: Cardiovascular;  Laterality: N/A;   ESOPHAGOGASTRODUODENOSCOPY (EGD) WITH PROPOFOL N/A 11/14/2016   Dr. Darrick Penna: Esophagus normal. Moderate gastritis, few gastric polyps. Duodenal biopsies negative. Fundic gland polyp gastric polyp area no H pylori.   ESOPHAGOGASTRODUODENOSCOPY (EGD) WITH PROPOFOL N/A 03/19/2023   Procedure: ESOPHAGOGASTRODUODENOSCOPY (EGD) WITH PROPOFOL;   Surgeon: Lanelle Bal, DO;  Location: [facility];  Service: Endoscopy;  Laterality: N/A;  10:15 am, asa 3   FOOT SURGERY     LEFT HEART CATH AND CORONARY ANGIOGRAPHY N/A 03/22/2018   Procedure: LEFT HEART CATH AND CORONARY ANGIOGRAPHY;  Surgeon: Kathleene Hazel, MD;  Location: [facility];  Service: Cardiovascular;  Laterality: N/A;   LEG SURGERY Right    POLYPECTOMY  04/20/2022   Procedure: POLYPECTOMY;  Surgeon: Lanelle Bal, DO;  Location: [facility];  Service: Endoscopy;;   REPLACEMENT TOTAL KNEE Right    SPINAL CORD STIMULATOR IMPLANT     FAMILY HISTORY Family History  Problem Relation Age of Onset   Cancer Mother 72  colon   Cancer Father        renal cell   Colon cancer Father    Breast cancer Sister    Cancer Sister        primary brain, and primary breast too   Breast cancer Sister    Cancer Sister        breast    Breast cancer Sister    Breast cancer Sister    Cancer Brother        kidney and liver   Sleep apnea Neg Hx    SOCIAL HISTORY Social History   Tobacco Use   Smoking status: Never   Smokeless tobacco: Never  Vaping Use   Vaping status: Never Used  Substance Use Topics   Alcohol use: No    Alcohol/week: 0.0 standard drinks of alcohol   Drug use: No       OPHTHALMIC EXAM:  Base Eye Exam     Visual Acuity (Snellen - Linear)       Right Left   Dist cc 20/30 -1 20/20   Dist ph cc NI     Correction: Glasses         Tonometry (Tonopen, 8:03 AM)       Right Left   Pressure 10 11         Pupils       Dark Light Shape React APD   Right 3 2 Round Brisk None   Left 3 2 Round Brisk None         Visual Fields (Counting fingers)       Left Right    Full Full         Extraocular Movement       Right Left    Full, Ortho Full, Ortho         Neuro/Psych     Oriented x3: Yes   Mood/Affect: Normal         Dilation     Both eyes: 1.0% Mydriacyl, 2.5% Phenylephrine @ 8:03 AM            Slit Lamp and Fundus Exam     Slit Lamp Exam       Right Left   Lids/Lashes Dermatochalasis - upper lid, mild MGD Dermatochalasis - upper lid, mild MGD   Conjunctiva/Sclera nasal pingeucula nasal pingeucula, Trace Injection   Cornea arcus, mild tear film debris arcus, mild tear film debris   Anterior Chamber deep and clear, no cell or flare deep and clear, no cell or flare   Iris Round and dilated, No NVI Round and dilated, No NVI   Lens 2+ Nuclear sclerosis, 2-3+ Cortical cataract, 1+ Posterior subcapsular cataract 2+ Nuclear sclerosis, 2-3+ Cortical cataract, 1+ Posterior subcapsular cataract   Anterior Vitreous Vitreous syneresis Vitreous syneresis         Fundus Exam       Right Left   Disc Pink and Sharp, no NVD, temporal PPA Pink and Sharp, no NVD, temporal PPP   C/D Ratio 0.4 0.4   Macula Good foveal reflex, +cystic changes, scattered MA -- improved Flat, good foveal reflex, scattered MA -- improved, trace cystic changes   Vessels attenuated, mild tortuosity attenuated, Tortuous, +NV -- regressing   Periphery Attached, dense 360 PRP extending posteriorly to arcades Attached, 360 PRP with good posterior fill in, room for fill in if needed           Refraction     Wearing Rx  Sphere Cylinder Axis Add   Right +1.75 +1.50 124 +2.50   Left +4.25 +2.25 001 +2.50           IMAGING AND PROCEDURES  Imaging and Procedures for 04/09/2023  OCT, Retina - OU - Both Eyes       Right Eye Quality was good. Central Foveal Thickness: 320. Progression has improved. Findings include normal foveal contour, no SRF, intraretinal fluid (Trace cystic changes centrally, mild interval improvement in non-central cystic changes temporal and IN macula -- caught on widefield).   Left Eye Quality was good. Central Foveal Thickness: 304. Progression has improved. Findings include normal foveal contour, no SRF, intraretinal fluid (Trace persistent cystic changes temporal macula  -- slightly improved).   Notes *Images captured and stored on drive  Diagnosis / Impression:  +DME OU OD: Trace cystic changes centrally, mild interval improvement in non-central cystic changes temporal and IN macula -- caught on widefield OS: Trace persistent cystic changes temporal macula -- slightly improved  Clinical management:  See below  Abbreviations: NFP - Normal foveal profile. CME - cystoid macular edema. PED - pigment epithelial detachment. IRF - intraretinal fluid. SRF - subretinal fluid. EZ - ellipsoid zone. ERM - epiretinal membrane. ORA - outer retinal atrophy. ORT - outer retinal tubulation. SRHM - subretinal hyper-reflective material. IRHM - intraretinal hyper-reflective material      Intravitreal Injection, Pharmacologic Agent - OD - Right Eye       Time Out 04/09/2023. 8:33 AM. Confirmed correct patient, procedure, site, and patient consented.   Anesthesia Topical anesthesia was used. Anesthetic medications included Lidocaine 2%, Proparacaine 0.5%.   Procedure Preparation included 5% betadine to ocular surface, eyelid speculum. A (32g) needle was used.   Injection: 2 mg aflibercept 2 MG/0.05ML   Route: Intravitreal, Site: Right Eye   NDC: L6038910, Lot: [id number], Expiration date: 06/29/2024, Waste: 0 mL   Post-op Post injection exam found visual acuity of at least counting fingers. The patient tolerated the procedure well. There were no complications. The patient received written and verbal post procedure care education.      Intravitreal Injection, Pharmacologic Agent - OS - Left Eye       Time Out 04/09/2023. 8:33 AM. Confirmed correct patient, procedure, site, and patient consented.   Anesthesia Topical anesthesia was used. Anesthetic medications included Lidocaine 2%, Proparacaine 0.5%.   Procedure Preparation included 5% betadine to ocular surface, eyelid speculum. A supplied (32g) needle was used.   Injection: 1.25 mg Bevacizumab  1.25mg /0.86ml   Route: Intravitreal, Site: Left Eye   NDC: P3213405, Lot: [id number], Expiration date: 04/21/2023   Post-op Post injection exam found visual acuity of at least counting fingers. The patient tolerated the procedure well. There were no complications. The patient received written and verbal post procedure care education. Post injection medications were not given.             ASSESSMENT/PLAN:    ICD-10-CM   1. Proliferative diabetic retinopathy of both eyes with macular edema associated with type 2 diabetes mellitus (HCC)  E11.3513 OCT, Retina - OU - Both Eyes    Intravitreal Injection, Pharmacologic Agent - OD - Right Eye    Intravitreal Injection, Pharmacologic Agent - OS - Left Eye    aflibercept (EYLEA) SOLN 2 mg    Bevacizumab (AVASTIN) SOLN 1.25 mg    2. Long term (current) use of oral hypoglycemic drugs  Z79.84     3. Long-term (current) use of injectable non-insulin antidiabetic drugs  Z79.85  4. Current use of insulin (HCC)  Z79.4     5. Essential hypertension  I10     6. Hypertensive retinopathy of both eyes  H35.033     7. Combined forms of age-related cataract of both eyes  H25.813        1-4. Proliferative diabetic retinopathy OU - previously managed by Dr. Cathey Endow -- lost to f/u since 2020 - s/p PRP OU and IVA OS x7 (BB) - s/p IVA OD #1 (01.02.24), #2 (02.02.24), #3 (03.01.24), #4(04.05.24) -- IVA resistance - s/p IVE OD #1 (05.03.24), #2 (06.03.24), #3 (07.01.24), #4 (08.05.24) - s/p IVA OS #1 (01.04.24), #2 (02.02.24), #3 (03.01.24), #4(04.05.24), #5 (05.03.24), #6 (06.03.24), #7 (07.01.24), #8 (08.05.24) - s/p PRP fill in OS (01.23.24) - exam shows scattered MA OU; laser PRP OU - FA (01.02.23) shows OD: Cluster of leakage temporal macula, no NV; OS: Scattered focal NVE greatest inferior and temporal quads -- will need PRP fill in OS -- completed Jan 2024 - BCVA 20/20 OU - OCT shows OD: Trace cystic changes centrally, mild interval  improvement in non-central cystic changes temporal and IN macula -- caught on widefield; OS: Trace persistent cystic changes temporal macula -- slightly improved at 5 weeks - recommend IVE OD #5 and IVA OS #9 today, 09.09.24 w/ f/u extended to 6 wks - pt wishes to proceed with injections - RBA of procedure discussed, questions answered - see procedure note - IVE informed consent obtained and signed, 05.03.24 (OU) - IVA informed consent obtained and signed, 01.02.24 (OU) - f/u 6 weeks -- DFE, OCT, possible injxns  5,6. Hypertensive retinopathy OU - discussed importance of tight BP control - monitor  7. Mixed Cataract OU - The symptoms of cataract, surgical options, and treatments and risks were discussed with patient. - discussed diagnosis and progression - monitor  Ophthalmic Meds Ordered this visit:  Meds ordered this encounter  Medications   aflibercept (EYLEA) SOLN 2 mg   Bevacizumab (AVASTIN) SOLN 1.25 mg     Return in about 6 weeks (around 05/21/2023) for f/u PDR OU, DFE, OCT.  There are no Patient Instructions on file for this visit.  This document serves as a record of services personally performed by Karie Chimera, MD, PhD. It was created on their behalf by Annalee Genta, COMT. The creation of this record is the provider's dictation and/or activities during the visit.  Electronically signed by: Annalee Genta, COMT 04/10/23 5:12 PM  This document serves as a record of services personally performed by Karie Chimera, MD, PhD. It was created on their behalf by Glee Arvin. Manson Passey, OA an ophthalmic technician. The creation of this record is the provider's dictation and/or activities during the visit.    Electronically signed by: Glee Arvin. Manson Passey, OA 04/10/23 5:12 PM  Karie Chimera, M.D., Ph.D. Diseases & Surgery of the Retina and Vitreous [facility] 04/09/2023   I have reviewed the above documentation for accuracy and completeness, and I agree with  the above. Karie Chimera, M.D., Ph.D. 04/10/23 5:12 PM   Abbreviations: M myopia (nearsighted); A astigmatism; H hyperopia (farsighted); P presbyopia; Mrx spectacle prescription;  CTL contact lenses; OD right eye; OS left eye; OU both eyes  XT exotropia; ET esotropia; PEK punctate epithelial keratitis; PEE punctate epithelial erosions; DES dry eye syndrome; MGD meibomian gland dysfunction; ATs artificial tears; PFAT's preservative free artificial tears; NSC nuclear sclerotic cataract; PSC posterior subcapsular cataract; ERM epi-retinal membrane; PVD posterior vitreous detachment; RD  retinal detachment; DM diabetes mellitus; DR diabetic retinopathy; NPDR non-proliferative diabetic retinopathy; PDR proliferative diabetic retinopathy; CSME clinically significant macular edema; DME diabetic macular edema; dbh dot blot hemorrhages; CWS cotton wool spot; POAG primary open angle glaucoma; C/D cup-to-disc ratio; HVF humphrey visual field; GVF goldmann visual field; OCT optical coherence tomography; IOP intraocular pressure; BRVO Branch retinal vein occlusion; CRVO central retinal vein occlusion; CRAO central retinal artery occlusion; BRAO branch retinal artery occlusion; RT retinal tear; SB scleral buckle; PPV pars plana vitrectomy; VH Vitreous hemorrhage; PRP panretinal laser photocoagulation; IVK intravitreal kenalog; VMT vitreomacular traction; MH Macular hole;  NVD neovascularization of the disc; NVE neovascularization elsewhere; AREDS age related eye disease study; ARMD age related macular degeneration; POAG primary open angle glaucoma; EBMD epithelial/anterior basement membrane dystrophy; ACIOL anterior chamber intraocular lens; IOL intraocular lens; PCIOL posterior chamber intraocular lens; Phaco/IOL phacoemulsification with intraocular lens placement; PRK photorefractive keratectomy; LASIK laser assisted in situ keratomileusis; HTN hypertension; DM diabetes mellitus; COPD chronic obstructive pulmonary  disease

## 2023-04-09 ENCOUNTER — Ambulatory Visit (INDEPENDENT_AMBULATORY_CARE_PROVIDER_SITE_OTHER): Payer: Medicare Other | Admitting: Ophthalmology

## 2023-04-09 ENCOUNTER — Encounter (INDEPENDENT_AMBULATORY_CARE_PROVIDER_SITE_OTHER): Payer: Self-pay | Admitting: Ophthalmology

## 2023-04-09 DIAGNOSIS — H25813 Combined forms of age-related cataract, bilateral: Secondary | ICD-10-CM | POA: Diagnosis not present

## 2023-04-09 DIAGNOSIS — I10 Essential (primary) hypertension: Secondary | ICD-10-CM

## 2023-04-09 DIAGNOSIS — Z7985 Long-term (current) use of injectable non-insulin antidiabetic drugs: Secondary | ICD-10-CM

## 2023-04-09 DIAGNOSIS — E113513 Type 2 diabetes mellitus with proliferative diabetic retinopathy with macular edema, bilateral: Secondary | ICD-10-CM

## 2023-04-09 DIAGNOSIS — H35033 Hypertensive retinopathy, bilateral: Secondary | ICD-10-CM

## 2023-04-09 DIAGNOSIS — Z7984 Long term (current) use of oral hypoglycemic drugs: Secondary | ICD-10-CM | POA: Diagnosis not present

## 2023-04-09 DIAGNOSIS — Z794 Long term (current) use of insulin: Secondary | ICD-10-CM | POA: Diagnosis not present

## 2023-04-09 MED ORDER — AFLIBERCEPT 2MG/0.05ML IZ SOLN FOR KALEIDOSCOPE
2.0000 mg | INTRAVITREAL | Status: AC | PRN
Start: 2023-04-09 — End: 2023-04-09
  Administered 2023-04-09: 2 mg via INTRAVITREAL

## 2023-04-09 MED ORDER — BEVACIZUMAB CHEMO INJECTION 1.25MG/0.05ML SYRINGE FOR KALEIDOSCOPE
1.2500 mg | INTRAVITREAL | Status: AC | PRN
Start: 2023-04-09 — End: 2023-04-09
  Administered 2023-04-09: 1.25 mg via INTRAVITREAL

## 2023-04-11 ENCOUNTER — Ambulatory Visit (INDEPENDENT_AMBULATORY_CARE_PROVIDER_SITE_OTHER): Payer: Self-pay

## 2023-04-11 DIAGNOSIS — J309 Allergic rhinitis, unspecified: Secondary | ICD-10-CM

## 2023-04-16 NOTE — Progress Notes (Unsigned)
NEUROLOGY FOLLOW UP OFFICE NOTE  Jodi Ray [id number]  Assessment/Plan:   1  Right sided benign paroxysmal positional vertigo with residual dysequilibrium, may require some more time to resolve 2  Episodic cluster headache 3  Obstructive sleep apnea    Monitor dizziness.  Hopefully will continue to improve.  If not, consider starting SSRI Cluster Rescue:  100% O2 15L/min for 15-20 min If cluster headaches increase frequency, consider verapamil Limit use of pain relievers to no more than 2 days out of week to prevent risk of rebound or medication-overuse headache. Keep headache diary Continue CPAP Follow up 5-6 months     Subjective:  Jodi Ray is a 62 year old handed woman with chronic pain related to complex regional pain syndrome (with spinal stimulator), CAD, CKD, hypertension, type 1 diabetes mellitus with polyneuropathy, OSA on CPAP, hypothyroidism, polymyalgia rheumatica who follows up for cluster headache and dizziness.     UPDATE: Dizziness:  ***  Cluster headaches:  ***    Current NSAIDs:  ASA 81mg  daily. Current analgesics:  none Current antihypertensive:  Coreg Current anti-epileptic:  none Current anti-CGRP: none Supplements:  Melatonin 20mg ; CoQ10 100mg ; D3 Other therapy:  100% O2 Other medications:  Synthroid Using CPAP Exercises regularly   HISTORY: I  Primary Stabbing Headache: Onset:  2011.  She was previously treated in [place], where she was diagnosed with primary stabbing headache. Location:  Right frontal region Quality:  stabbing Intensity:  10/10 Aura:  no Prodrome:  no Associated symptoms:  Some nausea if severe. No autonomic symptoms. Duration:  10 to 60 minutes Frequency:  4 times a week Triggers/exacerbating factors:  change in weather. Relieving factors:  none Activity:  Cannot function when experiencing it.   II Cluster headache:  She began having new intractable headaches in late Decemeber 2019.  Her right  eye gets bloodshot and right nare runs.  Right side of face gets red.  They stabbing headache starts above the right eye and radiates, constant for a month, fluctuating in intensity.  She went to the [facility] ED on 08/11/18.  CT of head and CTA of head and neck were performed and personally reviewed, showing no acute abnormality or aneurysm.  Sed rate from 08/22/18 was 24.  Past management:  Prednisone taper, headache cocktail               III  Migraines:  They are bi-frontal/maxillary and associated with slight nausea.  She has had this before when her allergies "act up."     IV  Diabetic neuropathy:  Secondary to Type 1 diabetes.   V  Complex regional pain syndrome:  following an accident where she fell down the steps and crushed her right leg.  She takes gabapentin, tramadol and has a spinal nerve stimulator.  She takes this for her painful diabetic neuropathy as well.   VI  Right arm pain and numbness:  Since 2015, she has had episodes of right arm pain and numbness.  It only occurs when she is laying in bed, and occurs no matter what position.  Her entire arm goes "dead".  It is both numb, painful and unable to move it.  There is no shooting pain down the arm from the neck, however she gets occasional shooting pain from the right side of her neck into the shoulder.  She has to use her other arm to shake it out and it resolves in a minute or two.  It occurs every night.  She was sent to pain management for possible cervical radiculopathy or thoracic outlet syndrome.  She did receive injections, which helped.  She had an MRI of the cervical and thoracic spine on 06/12/14.  Imaging not available, but report mentions diffuse facet arthropathy and degenerative changes in the cervical spine, but no nerve root impingement or cord compression.  There was no evidence of thoracic outlet syndrome.  She was sent to [facility] for further evaluation.  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  They told her that her symptoms were related to her diabetic neuropathy.   VII  In mid October 2023, she was laying down in bed.  She turned from her fight to left side and room started spinning lasting a minute.  The next day, it returned but has been persistent ever since.  No headache.  No speech disturbance or unilateral numbness or weakness.  Unchanged in last 2 months . No preceding viral illness.  No lateralizing symptoms.  She saw her ENT who found no inner ear etiology  Cardiology didn't find any cause.  PCP ordered CT of head performed on 07/11/2022 showed atrophy and chronic small vessel ischemic changes but no acute findings.  CTA Head and Neck on 08/29/2022 personally reviewed atherosclerosis of the carotid arteries but no LVO or hemodynamically significant stenosis.  I had ordered VNG performed in February which revealed right vestibular hypoperfusion  She has been going to physical therapy which has helped.    VIII  Cerebral aneurysm:  In 2009, she had a CTA of the head which reportedly showed a 1-27mm an eurysm at the junction of right A1 and A2 segment of the ACA.  However, a repeat CTA performed on 02/22/12 did not reveal any aneurysm.     She cannot have an MRI due to spinal stimulator.   Past medictions:   NSAIDs:  Indomethacin 25mg  three times daily (effective but discontinued due to elevated liver enzymes) Past analgesics:  Lidocaine nasal (lost efficacy) Past anthypertensive medication:  Verapamil (ineffective), lisinopril Past antiepileptics:  Trokendi XR (effective but expensive), topiramate, gabapentin Past CGRP inhibitor:  Emgality  PAST MEDICAL HISTORY: Past Medical History:  Diagnosis Date   Adult RDS (HCC)    Anemia    Brain aneurysm    frontal lobe   CAD (coronary artery disease)    a. 02/2018: s/p DES to Proximal LAD with residual 20% RCA  stenosis.   Chronic pain    Diabetes mellitus without complication (HCC)    Headache    History of left bundle branch block (LBBB)    HTN (hypertension)    Hx of cardiovascular stress test 10/2016   intermediate risk study   Hypothyroidism    Neuromuscular disorder (HCC)    Neuropathy    Obesity    OSA on CPAP 03/10/2015   Renal insufficiency    Retinopathy    Sleep apnea    Varicose veins of both lower extremities     MEDICATIONS: Current Outpatient Medications on File Prior to Visit  Medication Sig Dispense Refill   aspirin EC 81 MG tablet Take 1 tablet (81 mg total) by mouth daily. Swallow whole. 90 tablet 3   atorvastatin (LIPITOR) 80 MG tablet TAKE 1 TABLET BY MOUTH ONCE DAILY AT  6PM 90 tablet 2   calcitRIOL (ROCALTROL) 0.25 MCG capsule Take 0.25 mcg by mouth every other day.  carvedilol (COREG) 3.125 MG tablet Take by mouth.     Continuous Blood Gluc Sensor (DEXCOM G6 SENSOR) MISC 1 Device by Other route as directed.     desvenlafaxine (PRISTIQ) 50 MG 24 hr tablet Take 25 mg by mouth at bedtime.     EPINEPHrine 0.3 mg/0.3 mL IJ SOAJ injection Inject 0.3 mg into the muscle as needed for anaphylaxis. 2 each 1   fish oil-omega-3 fatty acids 1000 MG capsule Take 1 g by mouth in the morning and at bedtime.     furosemide (LASIX) 20 MG tablet Take 20 mg by mouth. Take 2 tablets in the morning and 1 tablet in the evening     furosemide (LASIX) 40 MG tablet Take 20-40 mg by mouth 2 (two) times daily. 40 MG AM, 20 MG PM     Insulin Human (INSULIN PUMP) SOLN Inject into the skin continuous.      Insulin Pen Needle (PEN NEEDLES 31GX5/16") 31G X 8 MM MISC      JARDIANCE 10 MG TABS tablet Take 5 mg by mouth every other day.     KERENDIA 10 MG TABS Take 1 tablet by mouth daily.     levothyroxine (SYNTHROID) 112 MCG tablet Take 112 mcg by mouth daily before breakfast.     loratadine (CLARITIN) 10 MG tablet Take 2 tablets (20 mg total) by mouth daily. (Patient not taking: Reported on  12/28/2022) 180 tablet 3   lubiprostone (AMITIZA) 24 MCG capsule Take 1 capsule (24 mcg total) by mouth 2 (two) times daily with a meal. 60 capsule 3   metFORMIN (GLUCOPHAGE) 500 MG tablet Take 1 tablet (500 mg total) by mouth 2 (two) times daily with a meal.     nitroGLYCERIN (NITROSTAT) 0.4 MG SL tablet Place 1 tablet (0.4 mg total) under the tongue every 5 (five) minutes as needed for chest pain. 25 tablet 3   NOVOLOG 100 UNIT/ML injection Inject into the skin continuous. In insulin pump  0   pantoprazole (PROTONIX) 40 MG tablet Take 1 tablet (40 mg total) by mouth 2 (two) times daily before a meal. 60 tablet 3   triamcinolone ointment (KENALOG) 0.1 % Apply 1 Application topically 2 (two) times daily. 30 g 0   No current facility-administered medications on file prior to visit.    ALLERGIES: Allergies  Allergen Reactions   Other Nausea And Vomiting and Other (See Comments)    Opiates Alters mental status diarrhea   Fentanyl Nausea And Vomiting and Other (See Comments)    Nausea, vomiting, loss of consciousness, requiring reversal    FAMILY HISTORY: Family History  Problem Relation Age of Onset   Cancer Mother 59       colon   Cancer Father        renal cell   Colon cancer Father    Breast cancer Sister    Cancer Sister        primary brain, and primary breast too   Breast cancer Sister    Cancer Sister        breast    Breast cancer Sister    Breast cancer Sister    Cancer Brother        kidney and liver   Sleep apnea Neg Hx       Objective:  *** General: No acute distress.  Patient appears well-groomed.   Head:  Normocephalic/atraumatic Eyes:  Fundi examined but not visualized Neck: supple, no paraspinal tenderness, full range of motion Heart:  Regular rate  and rhythm Neurological Exam: ***   Shon Millet, DO  CC: Kirstie Peri, MD

## 2023-04-17 DIAGNOSIS — E1151 Type 2 diabetes mellitus with diabetic peripheral angiopathy without gangrene: Secondary | ICD-10-CM | POA: Diagnosis not present

## 2023-04-17 DIAGNOSIS — E1140 Type 2 diabetes mellitus with diabetic neuropathy, unspecified: Secondary | ICD-10-CM | POA: Diagnosis not present

## 2023-04-18 ENCOUNTER — Encounter: Payer: Self-pay | Admitting: Neurology

## 2023-04-18 ENCOUNTER — Ambulatory Visit (INDEPENDENT_AMBULATORY_CARE_PROVIDER_SITE_OTHER): Payer: Medicare Other | Admitting: Neurology

## 2023-04-18 VITALS — BP 142/67 | HR 86 | Ht 68.0 in | Wt 247.0 lb

## 2023-04-18 DIAGNOSIS — G44029 Chronic cluster headache, not intractable: Secondary | ICD-10-CM | POA: Diagnosis not present

## 2023-04-18 DIAGNOSIS — F330 Major depressive disorder, recurrent, mild: Secondary | ICD-10-CM | POA: Diagnosis not present

## 2023-04-18 DIAGNOSIS — G4733 Obstructive sleep apnea (adult) (pediatric): Secondary | ICD-10-CM | POA: Diagnosis not present

## 2023-04-18 DIAGNOSIS — H8111 Benign paroxysmal vertigo, right ear: Secondary | ICD-10-CM | POA: Diagnosis not present

## 2023-04-18 DIAGNOSIS — Z79899 Other long term (current) drug therapy: Secondary | ICD-10-CM | POA: Diagnosis not present

## 2023-04-18 DIAGNOSIS — F411 Generalized anxiety disorder: Secondary | ICD-10-CM | POA: Diagnosis not present

## 2023-04-18 DIAGNOSIS — F329 Major depressive disorder, single episode, unspecified: Secondary | ICD-10-CM | POA: Diagnosis not present

## 2023-04-18 MED ORDER — VERAPAMIL HCL 80 MG PO TABS: 80.0000 mg | ORAL_TABLET | Freq: Three times a day (TID) | ORAL | 5 refills | Status: DC

## 2023-04-18 NOTE — Patient Instructions (Signed)
Start verapamil 80mg  three times daily.  If no improvement in headaches in 2 weeks, contact us and we can increase dose Use oxygen as needed

## 2023-04-20 ENCOUNTER — Ambulatory Visit (INDEPENDENT_AMBULATORY_CARE_PROVIDER_SITE_OTHER): Payer: Medicare Other

## 2023-04-20 DIAGNOSIS — J309 Allergic rhinitis, unspecified: Secondary | ICD-10-CM | POA: Diagnosis not present

## 2023-04-23 ENCOUNTER — Ambulatory Visit: Payer: Medicare Other | Admitting: Neurology

## 2023-04-25 ENCOUNTER — Ambulatory Visit (INDEPENDENT_AMBULATORY_CARE_PROVIDER_SITE_OTHER): Payer: Self-pay

## 2023-04-25 DIAGNOSIS — J309 Allergic rhinitis, unspecified: Secondary | ICD-10-CM

## 2023-04-27 NOTE — Progress Notes (Signed)
EXP 04/29/24

## 2023-04-30 DIAGNOSIS — J3081 Allergic rhinitis due to animal (cat) (dog) hair and dander: Secondary | ICD-10-CM | POA: Diagnosis not present

## 2023-05-01 DIAGNOSIS — J302 Other seasonal allergic rhinitis: Secondary | ICD-10-CM | POA: Diagnosis not present

## 2023-05-02 ENCOUNTER — Encounter: Payer: Self-pay | Admitting: Allergy & Immunology

## 2023-05-02 ENCOUNTER — Ambulatory Visit (INDEPENDENT_AMBULATORY_CARE_PROVIDER_SITE_OTHER): Payer: Medicare Other | Admitting: Allergy & Immunology

## 2023-05-02 ENCOUNTER — Ambulatory Visit: Payer: Self-pay

## 2023-05-02 ENCOUNTER — Telehealth: Payer: Self-pay

## 2023-05-02 ENCOUNTER — Encounter: Payer: Self-pay | Admitting: Internal Medicine

## 2023-05-02 ENCOUNTER — Ambulatory Visit: Payer: Medicare Other | Admitting: Internal Medicine

## 2023-05-02 VITALS — Ht 68.0 in | Wt 266.4 lb

## 2023-05-02 VITALS — BP 122/58 | HR 83 | Resp 14

## 2023-05-02 DIAGNOSIS — T782XXA Anaphylactic shock, unspecified, initial encounter: Secondary | ICD-10-CM | POA: Diagnosis not present

## 2023-05-02 DIAGNOSIS — R1032 Left lower quadrant pain: Secondary | ICD-10-CM | POA: Diagnosis not present

## 2023-05-02 DIAGNOSIS — R6881 Early satiety: Secondary | ICD-10-CM | POA: Diagnosis not present

## 2023-05-02 DIAGNOSIS — R110 Nausea: Secondary | ICD-10-CM | POA: Diagnosis not present

## 2023-05-02 DIAGNOSIS — K581 Irritable bowel syndrome with constipation: Secondary | ICD-10-CM | POA: Diagnosis not present

## 2023-05-02 DIAGNOSIS — J3089 Other allergic rhinitis: Secondary | ICD-10-CM | POA: Diagnosis not present

## 2023-05-02 DIAGNOSIS — J309 Allergic rhinitis, unspecified: Secondary | ICD-10-CM

## 2023-05-02 NOTE — Progress Notes (Signed)
FOLLOW UP  Date of Service/Encounter:  05/02/23   Assessment:   Anaphylaxis - from allergen immunotherapy  Seasonal and perennial allergic rhinitis (grasses, ragweed, weeds, trees, indoor molds, outdoor molds, dust mites, cat, and dog) - on allergen immunotherapy with maintenance reached September 2023   On beta blocker therapy   Complicated past medical history including renal failure as well as type 1 diabetes and migraines   GFR 48 mL/min (March 2024)   Vertigo - sees Dr. Suszanne Conners and receives vestibular therapy    Plan/Recommendations:   1. Anaphylaxis from allergen immunotherapy - We are going to make some adjustments to your dosing and increase very slowly (decrease to 0.1 mL of each vial and repeat each dose on Schedule A).  - We gave you prednisone today (take another dose tomorrow). - This will decrease the changes of biphasic reactions (meaning emergence of more symptoms once the epinephrine and antihistamine wear off). - We are going to call you this afternoon to see how you are doing. - Be safe with your driving tomorrow!   2. Follow up as scheduled.    Subjective:   RANDIE BLOODGOOD is a 62 y.o. female presenting today for follow up of  Chief Complaint  Patient presents with   Allergic Reaction    SHANASIA IBRAHIM has a history of the following: Patient Active Problem List   Diagnosis Date Noted   Nausea without vomiting 12/28/2022   Generalized abdominal pain 12/28/2022   Constipation 12/28/2022   Seasonal and perennial allergic rhinitis 07/13/2021   Diabetic ketoacidosis without coma associated with type 1 diabetes mellitus (HCC) 11/15/2019   Class 1 obesity 11/15/2019   Acute renal failure superimposed on stage 3 chronic kidney disease (HCC) 11/15/2019   CAD (coronary artery disease) 04/16/2018   Unstable angina (HCC) 03/21/2018   Atypical chest pain 03/20/2018   Gastritis and gastroduodenitis 03/05/2017   Suprapubic pain 03/05/2017   Right  sided abdominal pain 10/24/2016   Diarrhea 10/24/2016   Family hx of colon cancer 10/24/2016   Varicose veins of bilateral lower extremities with other complications 05/30/2016   Insomnia with sleep apnea 03/09/2016   Type I diabetes mellitus, uncontrolled 04/30/2015   Mixed hyperlipidemia 04/30/2015   Primary hypothyroidism 04/30/2015   Benign hypertension 04/30/2015   OSA on CPAP 03/10/2015   Migraine without aura and without status migrainosus, not intractable 01/08/2015   Primary stabbing headache 10/02/2014    History obtained from: chart review and patient.  Brinlee is a 62 y.o. female presenting for a sick visit.  Seen in March 2024.  At that time, we continue with shots at the same schedule.  We had our encounter epinephrine rinses to actions.  She had reached her maintenance since 2021 was doing well with her shots symptomatically.  In the interim, she is mostly today for shortness morning 1 5 mL each all I did fine when she was at the office.  She then left and went to get coffee at the grocery store and it started. She was driving e after her shot. Her heart was pounding and she thought she was having an MI. She could not breathe. She thought that this was an allergic reaction. She pulled over to a church parking lot and administered the EpiPen. She drove home and when she got into the door, she called [place]. She was still having a hard time breathing when she got home, but the sensation of panic improved.   She had more involvement of  her left arm. She held a cold bottle of water against it. It was swelling and this has improve. Now it feels like her arm is on fire. This is predominant on the LEFT arm.   She is traveling to [place] tomorrow. She is driving up with her boyfriend, who has dementia.   Otherwise, there have been no changes to her past medical history, surgical history, family history, or social history.    Review of systems otherwise negative other than that  mentioned in the HPI.    Objective:   Blood pressure (!) 122/58, pulse 83, resp. rate 14, SpO2 93%. There is no height or weight on file to calculate BMI.    Physical Exam Vitals reviewed.  Constitutional:      Appearance: She is well-developed and overweight.     Comments: Very talkative.  Making full sentences.   HENT:     Head: Normocephalic and atraumatic.     Right Ear: Tympanic membrane, ear canal and external ear normal. No drainage, swelling or tenderness. Tympanic membrane is not injected, scarred, erythematous, retracted or bulging.     Left Ear: Tympanic membrane, ear canal and external ear normal. No drainage, swelling or tenderness. Tympanic membrane is not injected, scarred, erythematous, retracted or bulging.     Nose: Mucosal edema and rhinorrhea present. No nasal deformity or septal deviation.     Right Turbinates: Enlarged, swollen and pale.     Left Turbinates: Enlarged, swollen and pale.     Right Sinus: No maxillary sinus tenderness or frontal sinus tenderness.     Left Sinus: No maxillary sinus tenderness or frontal sinus tenderness.     Comments: Clear rhinorrhea bilaterally. No nasal polyps.     Mouth/Throat:     Lips: Pink.     Mouth: Mucous membranes are moist. Mucous membranes are not pale and not dry.     Pharynx: Uvula midline.     Comments: Cobblestoning in the posterior oropharynx.  Eyes:     General:        Right eye: No discharge.        Left eye: No discharge.     Conjunctiva/sclera: Conjunctivae normal.     Right eye: Right conjunctiva is not injected. No chemosis.    Left eye: Left conjunctiva is not injected. No chemosis.    Pupils: Pupils are equal, round, and reactive to light.  Cardiovascular:     Rate and Rhythm: Normal rate and regular rhythm.     Heart sounds: Normal heart sounds.  Pulmonary:     Effort: Pulmonary effort is normal. No tachypnea, accessory muscle usage or respiratory distress.     Breath sounds: Decreased air  movement present. No wheezing, rhonchi or rales.     Comments: Decreased air movement at the bases.  Chest:     Chest wall: No tenderness.  Lymphadenopathy:     Head:     Right side of head: No submandibular, tonsillar or occipital adenopathy.     Left side of head: No submandibular, tonsillar or occipital adenopathy.     Cervical: No cervical adenopathy.  Skin:    Coloration: Skin is not pale.     Findings: No abrasion, erythema, petechiae or rash. Rash is not papular, urticarial or vesicular.  Neurological:     Mental Status: She is alert.  Psychiatric:        Behavior: Behavior is cooperative.      Diagnostic studies: none  She was given a Xopenex treatment.  She also received prednisone 40 mg cetirizine 10 mg.  Vitals remained stable throughout the day.  She did have a low pulse ox of 94%, but this did improve to 98% following the nebulizer treatment.      Malachi Bonds, MD  [facility]

## 2023-05-02 NOTE — Telephone Encounter (Signed)
-----   Message from Jodi Ray sent at 05/02/2023  1:16 PM EDT ----- Can someone call the patient later this afternoon?

## 2023-05-02 NOTE — Progress Notes (Signed)
Referring Provider: Kirstie Peri, MD Primary Care Physician:  Kirstie Peri, MD Primary GI:  Dr. Marletta Lor  Chief Complaint  Patient presents with   Abdominal Pain    Patient here today for a follow up on her abdominal pain. Still having issues with constipation, have a bm every five days, which is some what better as she was going every 8-9 days. She is taking Amitiza 24 mcg one bid, Miralax once Q am, metamucil bid, changed her diet as to she is only eating fruits and vegetable and occasionally fish.    HPI:   Jodi Ray is a 62 y.o. female with a history of anemia, brain aneurysm, CAD s/p stent in 2019, diabetes, chronic pain, HTN, hypothyroidism, OSA on CPAP, and CKD, adenomatous colon polyps due for surveillance in 2028, hemorrhoids, constipation, chronic upper abdominal pain on daily PPI, presenting today for follow-up of constipation, abdominal pain, and nausea.   IBS with constipation: Previously trialed Linzess, Trulance, now on Amitiza 24 mcg twice daily, MiraLAX every morning, Metamucil twice daily. States her constipation is improved, stool more formed, but still only have BM every 5 days. Continues to strain with BMs.   LLQ abdominal pain chronic, mild to moderate, intermittent.  Relieved with bowel movements.  Continues to have chronic nausea after meals.  History of type 1 diabetes for 50 years.  See further workup below.  Prior GI evaluation: HIDA scan 04/05/2023 unremarkable  Gastric imaging study 03/29/2023 without delayed gastric emptying.  EGD 03/19/2023 with gastritis, biopsies negative for H. Pylori.  CT abdomen pelvis with IV contrast 12/29/2022 unremarkable.  Colonoscopy  04/20/2022 with nonbleeding internal hemorrhoids, 3 mm cecal polyp removed, otherwise normal exam. Recommended 5-year surveillance due to family history of colon cancer. Pathology with tubular adenoma.   EGD April 2018 with moderate gastritis and benign gastric polyp. Also with normal  duodenal biopsies.    Colonoscopy in 2018 with evidence of internal hemorrhoids, redundant left colon, and negative random colon biopsies for microscopic colitis.    HIDA with normal gallbladder EF in 2018, but patient reported pain after Ensure ingestion. Initially saw surgeon, Dr. Marcha Solders, about possible cholecystectomy and scheduled surgery, but later cancelled as PCP did not think symptoms gallbladder related.     Abdominal ultrasound in 2018 with hepatic steatosis, mildly distended gallbladder, sonographic Murphy sign.   CT A/P with contrast in 2018 with fairly large amount of stool in the colon, small hiatal hernia, minimal ventral hernia containing fat only.  Past Medical History:  Diagnosis Date   Adult RDS (HCC)    Anemia    Brain aneurysm    frontal lobe   CAD (coronary artery disease)    a. 02/2018: s/p DES to Proximal LAD with residual 20% RCA stenosis.   Chronic pain    Diabetes mellitus without complication (HCC)    Headache    History of left bundle branch block (LBBB)    HTN (hypertension)    Hx of cardiovascular stress test 10/2016   intermediate risk study   Hypothyroidism    Neuromuscular disorder (HCC)    Neuropathy    Obesity    OSA on CPAP 03/10/2015   Renal insufficiency    Retinopathy    Sleep apnea    Varicose veins of both lower extremities     Past Surgical History:  Procedure Laterality Date   BIOPSY  11/14/2016   Procedure: BIOPSY;  Surgeon: West Bali, MD;  Location: [facility];  Service: Endoscopy;;  colon gastric duodenal   BIOPSY  03/19/2023   Procedure: BIOPSY;  Surgeon: Lanelle Bal, DO;  Location: [facility];  Service: Endoscopy;;   BREAST EXCISIONAL BIOPSY Right    BREAST SURGERY Left 1994   Lumpectomy   COLONOSCOPY WITH PROPOFOL N/A 11/14/2016   Dr. Darrick Penna: Moderately redundant rectosigmoid colon. Random colon biopsies benign. Internal hemorrhoids. Surveillance colonoscopy in 5 years.   COLONOSCOPY WITH PROPOFOL N/A  04/20/2022   Procedure: COLONOSCOPY WITH PROPOFOL;  Surgeon: Lanelle Bal, DO;  Location: [facility];  Service: Endoscopy;  Laterality: N/A;  12:30am   CORONARY STENT INTERVENTION N/A 03/22/2018   Procedure: CORONARY STENT INTERVENTION;  Surgeon: Kathleene Hazel, MD;  Location: [facility];  Service: Cardiovascular;  Laterality: N/A;   ESOPHAGOGASTRODUODENOSCOPY (EGD) WITH PROPOFOL N/A 11/14/2016   Dr. Darrick Penna: Esophagus normal. Moderate gastritis, few gastric polyps. Duodenal biopsies negative. Fundic gland polyp gastric polyp area no H pylori.   ESOPHAGOGASTRODUODENOSCOPY (EGD) WITH PROPOFOL N/A 03/19/2023   Procedure: ESOPHAGOGASTRODUODENOSCOPY (EGD) WITH PROPOFOL;  Surgeon: Lanelle Bal, DO;  Location: [facility];  Service: Endoscopy;  Laterality: N/A;  10:15 am, asa 3   FOOT SURGERY     LEFT HEART CATH AND CORONARY ANGIOGRAPHY N/A 03/22/2018   Procedure: LEFT HEART CATH AND CORONARY ANGIOGRAPHY;  Surgeon: Kathleene Hazel, MD;  Location: [facility];  Service: Cardiovascular;  Laterality: N/A;   LEG SURGERY Right    POLYPECTOMY  04/20/2022   Procedure: POLYPECTOMY;  Surgeon: Lanelle Bal, DO;  Location: [facility];  Service: Endoscopy;;   REPLACEMENT TOTAL KNEE Right    SPINAL CORD STIMULATOR IMPLANT      Current Outpatient Medications  Medication Sig Dispense Refill   aspirin EC 81 MG tablet Take 1 tablet (81 mg total) by mouth daily. Swallow whole. 90 tablet 3   atorvastatin (LIPITOR) 80 MG tablet TAKE 1 TABLET BY MOUTH ONCE DAILY AT  6PM 90 tablet 2   calcitRIOL (ROCALTROL) 0.25 MCG capsule Take 0.25 mcg by mouth every other day.     Continuous Blood Gluc Sensor (DEXCOM G6 SENSOR) MISC 1 Device by Other route as directed.     desvenlafaxine (PRISTIQ) 50 MG 24 hr tablet Take 25 mg by mouth at bedtime.     EPINEPHrine 0.3 mg/0.3 mL IJ SOAJ injection Inject 0.3 mg into the muscle as needed for anaphylaxis. 2 each 1   fish oil-omega-3  fatty acids 1000 MG capsule Take 1 g by mouth in the morning and at bedtime.     furosemide (LASIX) 20 MG tablet Take 20 mg by mouth. Take 2 tablets in the morning and 1 tablet in the evening     Insulin Human (INSULIN PUMP) SOLN Inject into the skin continuous.      Insulin Pen Needle (PEN NEEDLES 31GX5/16") 31G X 8 MM MISC      JARDIANCE 10 MG TABS tablet Take 5 mg by mouth every other day.     KERENDIA 10 MG TABS Take 1 tablet by mouth daily.     levothyroxine (SYNTHROID) 112 MCG tablet Take 112 mcg by mouth daily before breakfast.     lubiprostone (AMITIZA) 24 MCG capsule Take 1 capsule (24 mcg total) by mouth 2 (two) times daily with a meal. 60 capsule 3   metFORMIN (GLUCOPHAGE) 500 MG tablet Take 1 tablet (500 mg total) by mouth 2 (two) times daily with a meal.     nitroGLYCERIN (NITROSTAT) 0.4 MG SL tablet  Place 1 tablet (0.4 mg total) under the tongue every 5 (five) minutes as needed for chest pain. 25 tablet 3   NOVOLOG 100 UNIT/ML injection Inject into the skin continuous. In insulin pump  0   pantoprazole (PROTONIX) 40 MG tablet Take 1 tablet (40 mg total) by mouth 2 (two) times daily before a meal. 60 tablet 3   verapamil (CALAN) 80 MG tablet Take 1 tablet (80 mg total) by mouth 3 (three) times daily. 90 tablet 5   No current facility-administered medications for this visit.    Allergies as of 05/02/2023 - Review Complete 05/02/2023  Allergen Reaction Noted   Other Nausea And Vomiting and Other (See Comments) 08/04/2015   Fentanyl Nausea And Vomiting and Other (See Comments) 11/09/2016    Family History  Problem Relation Age of Onset   Cancer Mother 62       colon   Cancer Father        renal cell   Colon cancer Father    Breast cancer Sister    Cancer Sister        primary brain, and primary breast too   Breast cancer Sister    Cancer Sister        breast    Breast cancer Sister    Breast cancer Sister    Cancer Brother        kidney and liver   Sleep apnea Neg Hx      Social History   Socioeconomic History   Marital status: Widowed    Spouse name: Not on file   Number of children: 0   Years of education: Not on file   Highest education level: Bachelor's degree (e.g., BA, AB, BS)  Occupational History   Occupation: Retired  Tobacco Use   Smoking status: Never   Smokeless tobacco: Never  Vaping Use   Vaping status: Never Used  Substance and Sexual Activity   Alcohol use: No    Alcohol/week: 0.0 standard drinks of alcohol   Drug use: No   Sexual activity: Not Currently    Partners: Male  Other Topics Concern   Not on file  Social History Narrative   Drinks about 1 cup coffee day.   Cardiac rehab 3x week   One level home with significant other   Bachelors in Accounting      Right handed   No children   widow   Social Determinants of Health   Financial Resource Strain: Not on file  Food Insecurity: Not on file  Transportation Needs: Not on file  Physical Activity: Not on file  Stress: Not on file  Social Connections: Not on file    Subjective: Review of Systems  Constitutional:  Negative for chills and fever.  HENT:  Negative for congestion and hearing loss.   Eyes:  Negative for blurred vision and double vision.  Respiratory:  Negative for cough and shortness of breath.   Cardiovascular:  Negative for chest pain and palpitations.  Gastrointestinal:  Positive for abdominal pain, constipation and nausea. Negative for blood in stool, diarrhea, heartburn, melena and vomiting.  Genitourinary:  Negative for dysuria and urgency.  Musculoskeletal:  Negative for joint pain and myalgias.  Skin:  Negative for itching and rash.  Neurological:  Negative for dizziness and headaches.  Psychiatric/Behavioral:  Negative for depression. The patient is not nervous/anxious.      Objective: Ht 5\' 8"  (1.727 m)   Wt 266 lb 6.4 oz (120.8 kg)   BMI 40.51 kg/m  Physical Exam Constitutional:      Appearance: Normal appearance.  HENT:      Head: Normocephalic and atraumatic.  Eyes:     Extraocular Movements: Extraocular movements intact.     Conjunctiva/sclera: Conjunctivae normal.  Cardiovascular:     Rate and Rhythm: Normal rate and regular rhythm.  Pulmonary:     Effort: Pulmonary effort is normal.     Breath sounds: Normal breath sounds.  Abdominal:     General: Bowel sounds are normal.     Palpations: Abdomen is soft.  Musculoskeletal:        General: No swelling. Normal range of motion.     Cervical back: Normal range of motion and neck supple.  Skin:    General: Skin is warm and dry.     Coloration: Skin is not jaundiced.  Neurological:     General: No focal deficit present.     Mental Status: She is alert and oriented to person, place, and time.  Psychiatric:        Mood and Affect: Mood normal.        Behavior: Behavior normal.      Assessment/Plan:  1.  Irritable bowel syndrome with constipation-somewhat improved on current regimen though still only have a bowel movement every 5 days.  Having associated abdominal pain and bloating as well.  Continue Amitiza twice daily.  Increase MiraLAX to twice daily.  Change Metamucil to Benefiber twice daily.  Continue adequate water intake throughout the day.  2.  Nausea-chronic, see HPI for full workup.  Given her history of type 1 diabetes, she may have gastroparesis despite normal GES.  Consider trial of Reglan pending on clinical course.  3.  Left lower quadrant abdominal pain-CT abdomen pelvis reassuring.  Likely due to irritable bowel syndrome.  Hopefully this improves as we get her bowels moving more effectively.  Follow-up in 3 to 4 weeks   05/02/2023 2:53 PM   Disclaimer: This note was dictated with voice recognition software. Similar sounding words can inadvertently be transcribed and may not be corrected upon review.

## 2023-05-02 NOTE — Patient Instructions (Signed)
For your constipation, continue on Amitiza twice daily.  Increase MiraLAX to twice daily.  Would also recommend changing your Metamucil to Benefiber.  Continue to drink plenty of water throughout the day.  Follow-up in 3 to 4 weeks.  We may consider trial of Reglan at that time.  It was very nice seeing you again today.  Dr. Marletta Lor

## 2023-05-02 NOTE — Patient Instructions (Signed)
1. 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  - We gave you prednisone today (take another dose tomorrow). - This will decrease the changes of biphasic reactions (meaning emergence of more symptoms once the epinephrine and antihistamine wear off). - We are going to call you this afternoon to see how you are doing. - Be safe with your driving tomorrow!   2. Follow up as scheduled.   Please inform us of any Emergency Department visits, hospitalizations, or changes in symptoms. Call us before going to the ED for breathing or allergy symptoms since we might be able to fit you in for a sick visit. Feel free to contact us anytime with any questions, problems, or concerns.  It was a pleasure to see you again today, even under these circumstances!   Websites that have reliable patient information: 1. American Academy of Asthma, Allergy, and Immunology: www.aaaai.org 2. Food Allergy Research and Education (FARE): foodallergy.org 3. Mothers of Asthmatics: http://www.asthmacommunitynetwork.org 4. American College of Allergy, Asthma, and Immunology: www.acaai.org   COVID-19 Vaccine Information can be found at: PodExchange.nl For questions related to vaccine distribution or appointments, please email vaccine@ [email or web address] or call [phone].     "Like" [place] on Facebook and Instagram for our latest updates!      A healthy democracy works best when [employer] participate! Make sure you are registered to vote! If you have moved or changed any of your contact information, you will need to get this updated before voting! Scan the QR codes below to learn more!

## 2023-05-02 NOTE — Telephone Encounter (Signed)
Patient called the office as she is going to [place] with her boyfriend and did not want to miss our call later today. She informed me that she is feeling great and the swelling has been going down since she has been applying ice to the injection site areas.

## 2023-05-04 NOTE — Telephone Encounter (Signed)
Awesome! Glad you two were able to bond!   Malachi Bonds, MD [facility]

## 2023-05-10 ENCOUNTER — Other Ambulatory Visit: Payer: Self-pay | Admitting: Allergy & Immunology

## 2023-05-14 NOTE — Progress Notes (Signed)
[facility] - Clinic Note  05/21/2023     CHIEF COMPLAINT Patient presents for Retina Follow Up   HISTORY OF PRESENT ILLNESS: Jodi Ray is a 62 y.o. female who presents to the clinic today for:   HPI     Retina Follow Up   Patient presents with  Diabetic Retinopathy.  In both eyes.  This started 6 weeks ago.  I, the attending physician,  performed the HPI with the patient and updated documentation appropriately.        Comments   Patient here for 6 weeks retina follow up for PDR OU. Patient states vision doing better. Not as fuzzy. OS has eye pain. Has a sore there.       Last edited by Rennis Chris, MD on 05/21/2023  8:27 AM.    Pt feels like her vision is doing "a lot better", she states her sugars have been "running great"   Referring physician: Kirstie Peri, MD [address] [place],  [place] [address]  HISTORICAL INFORMATION:   Selected notes from the MEDICAL RECORD NUMBER Referred by Dr. [name] for concern of DME LEE:  Ocular Hx- PMH-    CURRENT MEDICATIONS: No current outpatient medications on file. (Ophthalmic Drugs)   No current facility-administered medications for this visit. (Ophthalmic Drugs)   Current Outpatient Medications (Other)  Medication Sig   aspirin EC 81 MG tablet Take 1 tablet (81 mg total) by mouth daily. Swallow whole.   atorvastatin (LIPITOR) 80 MG tablet TAKE 1 TABLET BY MOUTH ONCE DAILY AT  6PM   calcitRIOL (ROCALTROL) 0.25 MCG capsule Take 0.25 mcg by mouth every other day.   Continuous Blood Gluc Sensor (DEXCOM G6 SENSOR) MISC 1 Device by Other route as directed.   desvenlafaxine (PRISTIQ) 50 MG 24 hr tablet Take 25 mg by mouth at bedtime.   EPINEPHRINE 0.3 mg/0.3 mL IJ SOAJ injection INJECT CONTENTS OF ONE PEN INTO THE MUSCLE AS NEEDED FOR ANAPHYLAXIS   fish oil-omega-3 fatty acids 1000 MG capsule Take 1 g by mouth in the morning and at bedtime.   furosemide (LASIX) 20 MG tablet Take 20 mg by mouth. Take 2  tablets in the morning and 1 tablet in the evening   Insulin Human (INSULIN PUMP) SOLN Inject into the skin continuous.    Insulin Pen Needle (PEN NEEDLES 31GX5/16") 31G X 8 MM MISC    JARDIANCE 10 MG TABS tablet Take 5 mg by mouth every other day.   KERENDIA 10 MG TABS Take 1 tablet by mouth daily.   levothyroxine (SYNTHROID) 112 MCG tablet Take 112 mcg by mouth daily before breakfast.   lubiprostone (AMITIZA) 24 MCG capsule Take 1 capsule (24 mcg total) by mouth 2 (two) times daily with a meal.   metFORMIN (GLUCOPHAGE) 500 MG tablet Take 1 tablet (500 mg total) by mouth 2 (two) times daily with a meal.   nitroGLYCERIN (NITROSTAT) 0.4 MG SL tablet Place 1 tablet (0.4 mg total) under the tongue every 5 (five) minutes as needed for chest pain.   NOVOLOG 100 UNIT/ML injection Inject into the skin continuous. In insulin pump   pantoprazole (PROTONIX) 40 MG tablet Take 1 tablet (40 mg total) by mouth 2 (two) times daily before a meal.   verapamil (CALAN) 80 MG tablet Take 1 tablet (80 mg total) by mouth 3 (three) times daily.   No current facility-administered medications for this visit. (Other)   REVIEW OF SYSTEMS: ROS   Positive for: Endocrine, Cardiovascular,  Eyes Negative for: Constitutional, Gastrointestinal, Neurological, Skin, Genitourinary, Musculoskeletal, HENT, Respiratory, Psychiatric, Allergic/Imm, Heme/Lymph Last edited by Laddie Aquas, COA on 05/21/2023  7:53 AM.     ALLERGIES Allergies  Allergen Reactions   Other Nausea And Vomiting and Other (See Comments)    Opiates Alters mental status diarrhea   Fentanyl Nausea And Vomiting and Other (See Comments)    Nausea, vomiting, loss of consciousness, requiring reversal   PAST MEDICAL HISTORY Past Medical History:  Diagnosis Date   Adult RDS (HCC)    Anemia    Brain aneurysm    frontal lobe   CAD (coronary artery disease)    a. 02/2018: s/p DES to Proximal LAD with residual 20% RCA stenosis.   Chronic pain     Diabetes mellitus without complication (HCC)    Headache    History of left bundle branch block (LBBB)    HTN (hypertension)    Hx of cardiovascular stress test 10/2016   intermediate risk study   Hypothyroidism    Neuromuscular disorder (HCC)    Neuropathy    Obesity    OSA on CPAP 03/10/2015   Renal insufficiency    Retinopathy    Sleep apnea    Varicose veins of both lower extremities    Past Surgical History:  Procedure Laterality Date   BIOPSY  11/14/2016   Procedure: BIOPSY;  Surgeon: West Bali, MD;  Location: [facility];  Service: Endoscopy;;  colon gastric duodenal   BIOPSY  03/19/2023   Procedure: BIOPSY;  Surgeon: Lanelle Bal, DO;  Location: [facility];  Service: Endoscopy;;   BREAST EXCISIONAL BIOPSY Right    BREAST SURGERY Left 1994   Lumpectomy   COLONOSCOPY WITH PROPOFOL N/A 11/14/2016   Dr. Darrick Penna: Moderately redundant rectosigmoid colon. Random colon biopsies benign. Internal hemorrhoids. Surveillance colonoscopy in 5 years.   COLONOSCOPY WITH PROPOFOL N/A 04/20/2022   Procedure: COLONOSCOPY WITH PROPOFOL;  Surgeon: Lanelle Bal, DO;  Location: [facility];  Service: Endoscopy;  Laterality: N/A;  12:30am   CORONARY STENT INTERVENTION N/A 03/22/2018   Procedure: CORONARY STENT INTERVENTION;  Surgeon: Kathleene Hazel, MD;  Location: [facility];  Service: Cardiovascular;  Laterality: N/A;   ESOPHAGOGASTRODUODENOSCOPY (EGD) WITH PROPOFOL N/A 11/14/2016   Dr. Darrick Penna: Esophagus normal. Moderate gastritis, few gastric polyps. Duodenal biopsies negative. Fundic gland polyp gastric polyp area no H pylori.   ESOPHAGOGASTRODUODENOSCOPY (EGD) WITH PROPOFOL N/A 03/19/2023   Procedure: ESOPHAGOGASTRODUODENOSCOPY (EGD) WITH PROPOFOL;  Surgeon: Lanelle Bal, DO;  [facility]: [facility];  Service: Endoscopy;  Laterality: N/A;  10:15 am, asa 3   FOOT SURGERY     LEFT HEART CATH AND CORONARY ANGIOGRAPHY N/A 03/22/2018   Procedure: LEFT  HEART CATH AND CORONARY ANGIOGRAPHY;  Surgeon: Kathleene Hazel, MD;  Location: [facility];  Service: Cardiovascular;  Laterality: N/A;   LEG SURGERY Right    POLYPECTOMY  04/20/2022   Procedure: POLYPECTOMY;  Surgeon: Lanelle Bal, DO;  Location: [facility];  Service: Endoscopy;;   REPLACEMENT TOTAL KNEE Right    SPINAL CORD STIMULATOR IMPLANT     FAMILY HISTORY Family History  Problem Relation Age of Onset   Cancer Mother 28       colon   Cancer Father        renal cell   Colon cancer Father    Breast cancer Sister    Cancer Sister        primary brain, and  primary breast too   Breast cancer Sister    Cancer Sister        breast    Breast cancer Sister    Breast cancer Sister    Cancer Brother        kidney and liver   Sleep apnea Neg Hx    SOCIAL HISTORY Social History   Tobacco Use   Smoking status: Never   Smokeless tobacco: Never  Vaping Use   Vaping status: Never Used  Substance Use Topics   Alcohol use: No    Alcohol/week: 0.0 standard drinks of alcohol   Drug use: No       OPHTHALMIC EXAM:  Base Eye Exam     Visual Acuity (Snellen - Linear)       Right Left   Dist cc 20/30 +2 20/25 -2   Dist ph cc 20/25 -1 20/20    Correction: Glasses         Tonometry (Tonopen, 7:50 AM)       Right Left   Pressure 14 15         Pupils       Dark Light Shape React APD   Right 3 2 Round Brisk None   Left 3 2 Round Brisk None         Visual Fields (Counting fingers)       Left Right    Full Full         Extraocular Movement       Right Left    Full, Ortho Full, Ortho         Neuro/Psych     Oriented x3: Yes   Mood/Affect: Normal         Dilation     Both eyes: 1.0% Mydriacyl, 2.5% Phenylephrine @ 7:50 AM           Slit Lamp and Fundus Exam     Slit Lamp Exam       Right Left   Lids/Lashes Dermatochalasis - upper lid, mild MGD Dermatochalasis - upper lid, mild MGD   Conjunctiva/Sclera nasal  pingeucula nasal pingeucula, Trace Injection   Cornea arcus, mild tear film debris arcus, mild tear film debris   Anterior Chamber deep and clear, no cell or flare deep and clear, no cell or flare   Iris Round and dilated, No NVI Round and dilated, No NVI   Lens 2+ Nuclear sclerosis, 2-3+ Cortical cataract, 1+ Posterior subcapsular cataract 2+ Nuclear sclerosis, 2-3+ Cortical cataract, 1+ Posterior subcapsular cataract   Anterior Vitreous Vitreous syneresis Vitreous syneresis         Fundus Exam       Right Left   Disc Pink and Sharp, no NVD, temporal PPA Pink and Sharp, no NVD, temporal PPP   C/D Ratio 0.4 0.4   Macula Good foveal reflex, +cystic changes, scattered MA -- improved Flat, good foveal reflex, scattered MA -- improved, trace cystic changes   Vessels attenuated, Tortuous attenuated, Tortuous, +NV -- regressing   Periphery Attached, dense 360 PRP extending posteriorly to arcades Attached, 360 PRP with good posterior fill in, room for fill in if needed           Refraction     Wearing Rx       Sphere Cylinder Axis Add   Right +1.75 +1.50 124 +2.50   Left +4.25 +2.25 001 +2.50           IMAGING AND PROCEDURES  Imaging and Procedures for 05/21/2023  OCT, Retina - OU - Both Eyes       Right Eye Quality was good. Central Foveal Thickness: 329. Progression has worsened. Findings include normal foveal contour, no SRF, intraretinal fluid (Persistent cystic changes centrally -- slightly increased).   Left Eye Quality was good. Central Foveal Thickness: 312. Progression has worsened. Findings include normal foveal contour, no SRF, intraretinal fluid (Trace persistent cystic changes temporal macula -- slightly increased).   Notes *Images captured and stored on drive  Diagnosis / Impression:  +DME OU OD: Persistent cystic changes centrally -- slightly increased OS: Trace persistent cystic changes temporal macula -- slightly increased  Clinical management:  See  below  Abbreviations: NFP - Normal foveal profile. CME - cystoid macular edema. PED - pigment epithelial detachment. IRF - intraretinal fluid. SRF - subretinal fluid. EZ - ellipsoid zone. ERM - epiretinal membrane. ORA - outer retinal atrophy. ORT - outer retinal tubulation. SRHM - subretinal hyper-reflective material. IRHM - intraretinal hyper-reflective material      Intravitreal Injection, Pharmacologic Agent - OD - Right Eye       Time Out 05/21/2023. 8:42 AM. Confirmed correct patient, procedure, site, and patient consented.   Anesthesia Topical anesthesia was used. Anesthetic medications included Lidocaine 2%, Proparacaine 0.5%.   Procedure Preparation included 5% betadine to ocular surface, eyelid speculum. A (32g) needle was used.   Injection: 1.25 mg Bevacizumab 1.25mg /0.32ml   Route: Intravitreal, Site: Right Eye   NDC: P3213405, Lot: [id number], Expiration date: 07/07/2023   Post-op Post injection exam found visual acuity of at least counting fingers. The patient tolerated the procedure well. There were no complications. The patient received written and verbal post procedure care education.      Intravitreal Injection, Pharmacologic Agent - OS - Left Eye       Time Out 05/21/2023. 8:49 AM. Confirmed correct patient, procedure, site, and patient consented.   Anesthesia Topical anesthesia was used. Anesthetic medications included Lidocaine 2%, Proparacaine 0.5%.   Procedure Preparation included 5% betadine to ocular surface, eyelid speculum. A (32g) needle was used.   Injection: 2 mg aflibercept 2 MG/0.05ML   Route: Intravitreal, Site: Left Eye   NDC: L6038910, Lot: [id number], Expiration date: 03/30/2024, Waste: 0 mL   Post-op Post injection exam found visual acuity of at least counting fingers. The patient tolerated the procedure well. There were no complications. The patient received written and verbal post procedure care education. Post injection  medications were not given.            ASSESSMENT/PLAN:    ICD-10-CM   1. Proliferative diabetic retinopathy of both eyes with macular edema associated with type 2 diabetes mellitus (HCC)  E11.3513 OCT, Retina - OU - Both Eyes    Intravitreal Injection, Pharmacologic Agent - OD - Right Eye    Intravitreal Injection, Pharmacologic Agent - OS - Left Eye    aflibercept (EYLEA) SOLN 2 mg    Bevacizumab (AVASTIN) SOLN 1.25 mg    2. Long term (current) use of oral hypoglycemic drugs  Z79.84     3. Long-term (current) use of injectable non-insulin antidiabetic drugs  Z79.85     4. Current use of insulin (HCC)  Z79.4     5. Essential hypertension  I10     6. Hypertensive retinopathy of both eyes  H35.033     7. Combined forms of age-related cataract of both eyes  H25.813       1-4. Proliferative diabetic retinopathy OU - previously managed by Dr. Cathey Endow -- lost  to f/u since 2020 - s/p PRP OU and IVA OS x7 (BB) - s/p IVA OD #1 (01.02.24), #2 (02.02.24), #3 (03.01.24), #4(04.05.24) -- IVA resistance - s/p IVE OD #1 (05.03.24), #2 (06.03.24), #3 (07.01.24), #4 (08.05.24), #5 (09.09.24) - s/p IVA OS #1 (01.04.24), #2 (02.02.24), #3 (03.01.24), #4(04.05.24), #5 (05.03.24), #6 (06.03.24), #7 (07.01.24), #8 (08.05.24), #9 (09.09.24) - s/p PRP fill in OS (01.23.24) - exam shows scattered MA OU; laser PRP OU - FA (01.02.23) shows OD: Cluster of leakage temporal macula, no NV; OS: Scattered focal NVE greatest inferior and temporal quads -- will need PRP fill in OS -- completed Jan 2024 - BCVA 20/20 OU - OCT shows OD: Persistent cystic changes centrally -- slightly increased; OS: Trace persistent cystic changes temporal macula -- slightly increased at 6 weeks - recommend IVE OD #6 and IVA OS #10 today, 10.21.24 w/ f/u extended to 6 wks - pt wishes to proceed with injections - RBA of procedure discussed, questions answered - see procedure note **After completion of procedures, it was  discovered that the medications were switched -- OD received Avastin #5 and OS received Eylea #1 in error** - pt was immediately notified via telephone of the error - IVE informed consent obtained and signed, 05.03.24 (OU) - IVA informed consent obtained and signed, 01.02.24 (OU) - f/u 6 weeks -- DFE, OCT, possible injxns  5,6. Hypertensive retinopathy OU - discussed importance of tight BP control  - monitor  7. Mixed Cataract OU - The symptoms of cataract, surgical options, and treatments and risks were discussed with patient. - discussed diagnosis and progression - monitor  Ophthalmic Meds Ordered this visit:  Meds ordered this encounter  Medications   aflibercept (EYLEA) SOLN 2 mg   Bevacizumab (AVASTIN) SOLN 1.25 mg     Return in about 6 weeks (around 07/02/2023) for f/u PDR OU, DFE, OCT.  There are no Patient Instructions on file for this visit.  This document serves as a record of services personally performed by Karie Chimera, MD, PhD. It was created on their behalf by Annalee Genta, COMT. The creation of this record is the provider's dictation and/or activities during the visit.  Electronically signed by: Annalee Genta, COMT 05/21/23 4:10 PM  This document serves as a record of services personally performed by Karie Chimera, MD, PhD. It was created on their behalf by Glee Arvin. Manson Passey, OA an ophthalmic technician. The creation of this record is the provider's dictation and/or activities during the visit.    Electronically signed by: Glee Arvin. Manson Passey, OA 05/21/23 4:10 PM  Karie Chimera, M.D., Ph.D. Diseases & Surgery of the Retina and Vitreous [facility] 05/21/2023   I have reviewed the above documentation for accuracy and completeness, and I agree with the above. Karie Chimera, M.D., Ph.D. 05/21/23 4:18 PM    Abbreviations: M myopia (nearsighted); A astigmatism; H hyperopia (farsighted); P presbyopia; Mrx spectacle prescription;  CTL  contact lenses; OD right eye; OS left eye; OU both eyes  XT exotropia; ET esotropia; PEK punctate epithelial keratitis; PEE punctate epithelial erosions; DES dry eye syndrome; MGD meibomian gland dysfunction; ATs artificial tears; PFAT's preservative free artificial tears; NSC nuclear sclerotic cataract; PSC posterior subcapsular cataract; ERM epi-retinal membrane; PVD posterior vitreous detachment; RD retinal detachment; DM diabetes mellitus; DR diabetic retinopathy; NPDR non-proliferative diabetic retinopathy; PDR proliferative diabetic retinopathy; CSME clinically significant macular edema; DME diabetic macular edema; dbh dot blot hemorrhages; CWS cotton wool spot; POAG primary open  angle glaucoma; C/D cup-to-disc ratio; HVF humphrey visual field; GVF goldmann visual field; OCT optical coherence tomography; IOP intraocular pressure; BRVO Branch retinal vein occlusion; CRVO central retinal vein occlusion; CRAO central retinal artery occlusion; BRAO branch retinal artery occlusion; RT retinal tear; SB scleral buckle; PPV pars plana vitrectomy; VH Vitreous hemorrhage; PRP panretinal laser photocoagulation; IVK intravitreal kenalog; VMT vitreomacular traction; MH Macular hole;  NVD neovascularization of the disc; NVE neovascularization elsewhere; AREDS age related eye disease study; ARMD age related macular degeneration; POAG primary open angle glaucoma; EBMD epithelial/anterior basement membrane dystrophy; ACIOL anterior chamber intraocular lens; IOL intraocular lens; PCIOL posterior chamber intraocular lens; Phaco/IOL phacoemulsification with intraocular lens placement; PRK photorefractive keratectomy; LASIK laser assisted in situ keratomileusis; HTN hypertension; DM diabetes mellitus; COPD chronic obstructive pulmonary disease

## 2023-05-16 ENCOUNTER — Ambulatory Visit (INDEPENDENT_AMBULATORY_CARE_PROVIDER_SITE_OTHER): Payer: Self-pay

## 2023-05-16 DIAGNOSIS — J309 Allergic rhinitis, unspecified: Secondary | ICD-10-CM | POA: Diagnosis not present

## 2023-05-21 ENCOUNTER — Ambulatory Visit (INDEPENDENT_AMBULATORY_CARE_PROVIDER_SITE_OTHER): Payer: Medicare Other | Admitting: Ophthalmology

## 2023-05-21 ENCOUNTER — Encounter (INDEPENDENT_AMBULATORY_CARE_PROVIDER_SITE_OTHER): Payer: Self-pay | Admitting: Ophthalmology

## 2023-05-21 DIAGNOSIS — Z7985 Long-term (current) use of injectable non-insulin antidiabetic drugs: Secondary | ICD-10-CM

## 2023-05-21 DIAGNOSIS — H35033 Hypertensive retinopathy, bilateral: Secondary | ICD-10-CM | POA: Diagnosis not present

## 2023-05-21 DIAGNOSIS — I10 Essential (primary) hypertension: Secondary | ICD-10-CM

## 2023-05-21 DIAGNOSIS — E113513 Type 2 diabetes mellitus with proliferative diabetic retinopathy with macular edema, bilateral: Secondary | ICD-10-CM | POA: Diagnosis not present

## 2023-05-21 DIAGNOSIS — Z7984 Long term (current) use of oral hypoglycemic drugs: Secondary | ICD-10-CM

## 2023-05-21 DIAGNOSIS — H25813 Combined forms of age-related cataract, bilateral: Secondary | ICD-10-CM

## 2023-05-21 DIAGNOSIS — Z794 Long term (current) use of insulin: Secondary | ICD-10-CM

## 2023-05-21 MED ORDER — BEVACIZUMAB CHEMO INJECTION 1.25MG/0.05ML SYRINGE FOR KALEIDOSCOPE
1.2500 mg | INTRAVITREAL | Status: AC | PRN
  Administered 2023-05-21: 1.25 mg via INTRAVITREAL

## 2023-05-21 MED ORDER — AFLIBERCEPT 2MG/0.05ML IZ SOLN FOR KALEIDOSCOPE
2.0000 mg | INTRAVITREAL | Status: AC | PRN
Start: 2023-05-21 — End: 2023-05-21
  Administered 2023-05-21: 2 mg via INTRAVITREAL

## 2023-05-23 ENCOUNTER — Ambulatory Visit (INDEPENDENT_AMBULATORY_CARE_PROVIDER_SITE_OTHER): Payer: Self-pay

## 2023-05-23 ENCOUNTER — Ambulatory Visit: Payer: Self-pay

## 2023-05-23 DIAGNOSIS — J309 Allergic rhinitis, unspecified: Secondary | ICD-10-CM

## 2023-05-30 ENCOUNTER — Ambulatory Visit (INDEPENDENT_AMBULATORY_CARE_PROVIDER_SITE_OTHER): Payer: Self-pay

## 2023-05-30 DIAGNOSIS — J309 Allergic rhinitis, unspecified: Secondary | ICD-10-CM | POA: Diagnosis not present

## 2023-06-06 ENCOUNTER — Ambulatory Visit (INDEPENDENT_AMBULATORY_CARE_PROVIDER_SITE_OTHER): Payer: Medicare Other

## 2023-06-06 DIAGNOSIS — J309 Allergic rhinitis, unspecified: Secondary | ICD-10-CM | POA: Diagnosis not present

## 2023-06-07 DIAGNOSIS — I10 Essential (primary) hypertension: Secondary | ICD-10-CM | POA: Diagnosis not present

## 2023-06-07 DIAGNOSIS — F410 Panic disorder [episodic paroxysmal anxiety] without agoraphobia: Secondary | ICD-10-CM | POA: Diagnosis not present

## 2023-06-07 DIAGNOSIS — N183 Chronic kidney disease, stage 3 unspecified: Secondary | ICD-10-CM | POA: Diagnosis not present

## 2023-06-07 DIAGNOSIS — Z299 Encounter for prophylactic measures, unspecified: Secondary | ICD-10-CM | POA: Diagnosis not present

## 2023-06-13 ENCOUNTER — Other Ambulatory Visit (HOSPITAL_COMMUNITY)
Admission: RE | Admit: 2023-06-13 | Discharge: 2023-06-13 | Disposition: A | Discharge status: Home / Self Care | Payer: Medicare Other | Source: Ambulatory Visit | Attending: Cardiology | Admitting: Cardiology

## 2023-06-13 ENCOUNTER — Ambulatory Visit: Payer: Medicare Other | Admitting: Cardiology

## 2023-06-13 ENCOUNTER — Encounter: Payer: Self-pay | Admitting: Cardiology

## 2023-06-13 ENCOUNTER — Ambulatory Visit (INDEPENDENT_AMBULATORY_CARE_PROVIDER_SITE_OTHER): Payer: Medicare Other

## 2023-06-13 VITALS — BP 150/85 | HR 84 | Ht 69.0 in | Wt 264.0 lb

## 2023-06-13 DIAGNOSIS — R079 Chest pain, unspecified: Secondary | ICD-10-CM | POA: Insufficient documentation

## 2023-06-13 DIAGNOSIS — J309 Allergic rhinitis, unspecified: Secondary | ICD-10-CM | POA: Diagnosis not present

## 2023-06-13 DIAGNOSIS — I25118 Atherosclerotic heart disease of native coronary artery with other forms of angina pectoris: Secondary | ICD-10-CM | POA: Insufficient documentation

## 2023-06-13 DIAGNOSIS — I10 Essential (primary) hypertension: Secondary | ICD-10-CM

## 2023-06-13 LAB — CBC
HCT: 39.5 % (ref 36.0–46.0)
Hemoglobin: 12.7 g/dL (ref 12.0–15.0)
MCH: 27.8 pg (ref 26.0–34.0)
MCHC: 32.2 g/dL (ref 30.0–36.0)
MCV: 86.4 fL (ref 80.0–100.0)
Platelets: 237 10*3/uL (ref 150–400)
RBC: 4.57 MIL/uL (ref 3.87–5.11)
RDW: 15.7 % — ABNORMAL HIGH (ref 11.5–15.5)
WBC: 5.8 10*3/uL (ref 4.0–10.5)
nRBC: 0 % (ref 0.0–0.2)

## 2023-06-13 LAB — BASIC METABOLIC PANEL
Anion gap: 9 (ref 5–15)
BUN: 20 mg/dL (ref 8–23)
CO2: 27 mmol/L (ref 22–32)
Calcium: 9.2 mg/dL (ref 8.9–10.3)
Chloride: 103 mmol/L (ref 98–111)
Creatinine, Ser: 1.04 mg/dL — ABNORMAL HIGH (ref 0.44–1.00)
GFR, Estimated: >60 mL/min (ref >60)
Glucose, Bld: 61 mg/dL — ABNORMAL LOW (ref 70–99)
Potassium: 3.6 mmol/L (ref 3.5–5.1)
Sodium: 139 mmol/L (ref 135–145)

## 2023-06-13 MED ORDER — HYDRALAZINE HCL 25 MG PO TABS: 25.0000 mg | ORAL_TABLET | Freq: Three times a day (TID) | ORAL | 3 refills | Status: DC

## 2023-06-13 MED ORDER — NITROGLYCERIN 0.4 MG SL SUBL: 0.4000 mg | SUBLINGUAL_TABLET | SUBLINGUAL | 3 refills | Status: AC | PRN

## 2023-06-13 NOTE — H&P (View-Only) (Signed)
 Clinical Summary Ms. Guglielmo is a 62 y.o.female seen today for follow up of the following medical problems.    1. CAD - drug-eluting stent placement to the proximal LAD on 03/22/2018. - from notes has chronic nonspecific chest pains  - LV systolic function is normal with an EF of 60 to 65% by echocardiogram in December 2019  -01/2022 nuclear stress: no ischemia  - midchest to between shoulder blades, can vary in character sometimes sharp or pressure. 8/10 in severity. Can occur rest, more often with activity or exertion. Noticing some DOE that is progressing to lower levels activity. Pain can last 415 minutes. Not positional. Varies in frequency.  - feels similar to her pains before her last stent.     2. HTN - compliant with meds Had some issues with low bp's previously    -home bp's 140s-180s/60s-70s - nephrorolgoist had stopped lisinopril years ago per her report due to declining renal function - up 11 lbs since last year - started verapamil by neurologst for migraine headaches.       Other medical isuses not addressed this visit  3. Hyperlipidemia - upcoming labs with pcp - Jan 2022 TC 167 TG 211 LDL 86 01/2021 TC 157 TG 34 HDL 88 LDL 61   4. OSA - using cpap machine, followed by Dr Vickey Huger.    5. DM1 - followed by endocrinology   6. CKD 3 - followed by Dr Wolfgang Phoenix - had renal biopsy 06/28/2021. - from renal notes historically sensitive to diuretics - lasix managed by nephrology, takes 2 tables in AM and 1 tablet in PM. Swelling is much improved.      7. Chronic LBBB   8. Chronic diastolic HF  Echo LVEF 60-65%, grade I dd - diuretics per renal, from there note has historically been sensitive to diuretic dosing regarding her renal function   - no recent fluid issuess   9.Dizziness - symptoms would occur with laying down, worst with rolling over. Feeling of room spinning, nausea. Would get some dizziness with standing and walking. Intermittent blurry  vision - seen by ENT, thought not related.      - 02/2022 monitor rare ectopy, 6 runs SVT longest 10 beats 06/2022 CT head: no acute process. Atrophy and chronic small vessel ischemic changes  - ongoing workup by pcp, ENT.   10. Palpitations - - 02/2022 monitor rare ectopy, 6 runs SVT longest 10 beats Past Medical History:  Diagnosis Date   Adult RDS (HCC)    Anemia    Brain aneurysm    frontal lobe   CAD (coronary artery disease)    a. 02/2018: s/p DES to Proximal LAD with residual 20% RCA stenosis.   Chronic pain    Diabetes mellitus without complication (HCC)    Headache    History of left bundle Evangelene Vora block (LBBB)    HTN (hypertension)    Hx of cardiovascular stress test 10/2016   intermediate risk study   Hypothyroidism    Neuromuscular disorder (HCC)    Neuropathy    Obesity    OSA on CPAP 03/10/2015   Renal insufficiency    Retinopathy    Sleep apnea    Varicose veins of both lower extremities      Allergies  Allergen Reactions   Other Nausea And Vomiting and Other (See Comments)    Opiates Alters mental status diarrhea   Fentanyl Nausea And Vomiting and Other (See Comments)    Nausea, vomiting, loss of  consciousness, requiring reversal     Current Outpatient Medications  Medication Sig Dispense Refill   aspirin EC 81 MG tablet Take 1 tablet (81 mg total) by mouth daily. Swallow whole. 90 tablet 3   atorvastatin (LIPITOR) 80 MG tablet TAKE 1 TABLET BY MOUTH ONCE DAILY AT  6PM 90 tablet 2   calcitRIOL (ROCALTROL) 0.25 MCG capsule Take 0.25 mcg by mouth every other day.     Continuous Blood Gluc Sensor (DEXCOM G6 SENSOR) MISC 1 Device by Other route as directed.     desvenlafaxine (PRISTIQ) 50 MG 24 hr tablet Take 25 mg by mouth at bedtime.     EPINEPHRINE 0.3 mg/0.3 mL IJ SOAJ injection INJECT CONTENTS OF ONE PEN INTO THE MUSCLE AS NEEDED FOR ANAPHYLAXIS 2 each 0   fish oil-omega-3 fatty acids 1000 MG capsule Take 1 g by mouth in the morning and at  bedtime.     furosemide (LASIX) 20 MG tablet Take 20 mg by mouth. Take 2 tablets in the morning and 1 tablet in the evening     Insulin Human (INSULIN PUMP) SOLN Inject into the skin continuous.      Insulin Pen Needle (PEN NEEDLES 31GX5/16") 31G X 8 MM MISC      JARDIANCE 10 MG TABS tablet Take 5 mg by mouth every other day.     KERENDIA 10 MG TABS Take 1 tablet by mouth daily.     levothyroxine (SYNTHROID) 112 MCG tablet Take 112 mcg by mouth daily before breakfast.     lubiprostone (AMITIZA) 24 MCG capsule Take 1 capsule (24 mcg total) by mouth 2 (two) times daily with a meal. 60 capsule 3   metFORMIN (GLUCOPHAGE) 500 MG tablet Take 1 tablet (500 mg total) by mouth 2 (two) times daily with a meal.     nitroGLYCERIN (NITROSTAT) 0.4 MG SL tablet Place 1 tablet (0.4 mg total) under the tongue every 5 (five) minutes as needed for chest pain. 25 tablet 3   NOVOLOG 100 UNIT/ML injection Inject into the skin continuous. In insulin pump  0   pantoprazole (PROTONIX) 40 MG tablet Take 1 tablet (40 mg total) by mouth 2 (two) times daily before a meal. 60 tablet 3   verapamil (CALAN) 80 MG tablet Take 1 tablet (80 mg total) by mouth 3 (three) times daily. 90 tablet 5   No current facility-administered medications for this visit.     Past Surgical History:  Procedure Laterality Date   BIOPSY  11/14/2016   Procedure: BIOPSY;  Surgeon: West Bali, MD;  [facility]: [facility];  Service: Endoscopy;;  colon gastric duodenal   BIOPSY  03/19/2023   Procedure: BIOPSY;  Surgeon: Lanelle Bal, DO;  [facility]: [facility];  Service: Endoscopy;;   BREAST EXCISIONAL BIOPSY Right    BREAST SURGERY Left 1994   Lumpectomy   COLONOSCOPY WITH PROPOFOL N/A 11/14/2016   Dr. Darrick Penna: Moderately redundant rectosigmoid colon. Random colon biopsies benign. Internal hemorrhoids. Surveillance colonoscopy in 5 years.   COLONOSCOPY WITH PROPOFOL N/A 04/20/2022   Procedure: COLONOSCOPY WITH PROPOFOL;  Surgeon:  Lanelle Bal, DO;  Location: [facility];  Service: Endoscopy;  Laterality: N/A;  12:30am   CORONARY STENT INTERVENTION N/A 03/22/2018   Procedure: CORONARY STENT INTERVENTION;  Surgeon: Kathleene Hazel, MD;  Location: [facility];  Service: Cardiovascular;  Laterality: N/A;   ESOPHAGOGASTRODUODENOSCOPY (EGD) WITH PROPOFOL N/A 11/14/2016   Dr. Darrick Penna: Esophagus normal. Moderate gastritis, few gastric polyps. Duodenal biopsies  negative. Fundic gland polyp gastric polyp area no H pylori.   ESOPHAGOGASTRODUODENOSCOPY (EGD) WITH PROPOFOL N/A 03/19/2023   Procedure: ESOPHAGOGASTRODUODENOSCOPY (EGD) WITH PROPOFOL;  Surgeon: Lanelle Bal, DO;  [facility];  Service: Endoscopy;  Laterality: N/A;  10:15 am, asa 3   FOOT SURGERY     LEFT HEART CATH AND CORONARY ANGIOGRAPHY N/A 03/22/2018   Procedure: LEFT HEART CATH AND CORONARY ANGIOGRAPHY;  Surgeon: Kathleene Hazel, MD;  Location: [facility];  Service: Cardiovascular;  Laterality: N/A;   LEG SURGERY Right    POLYPECTOMY  04/20/2022   Procedure: POLYPECTOMY;  Surgeon: Lanelle Bal, DO;  [facility]: [facility];  Service: Endoscopy;;   REPLACEMENT TOTAL KNEE Right    SPINAL CORD STIMULATOR IMPLANT       Allergies  Allergen Reactions   Other Nausea And Vomiting and Other (See Comments)    Opiates Alters mental status diarrhea   Fentanyl Nausea And Vomiting and Other (See Comments)    Nausea, vomiting, loss of consciousness, requiring reversal      Family History  Problem Relation Age of Onset   Cancer Mother 68       colon   Cancer Father        renal cell   Colon cancer Father    Breast cancer Sister    Cancer Sister        primary brain, and primary breast too   Breast cancer Sister    Cancer Sister        breast    Breast cancer Sister    Breast cancer Sister    Cancer Brother        kidney and liver   Sleep apnea Neg Hx      Social History Ms. Grider reports that  she has never smoked. She has never used smokeless tobacco. Ms. Generette reports no history of alcohol use.   Review of Systems CONSTITUTIONAL: No weight loss, fever, chills, weakness or fatigue.  HEENT: Eyes: No visual loss, blurred vision, double vision or yellow sclerae.No hearing loss, sneezing, congestion, runny nose or sore throat.  SKIN: No rash or itching.  CARDIOVASCULAR: per hpi RESPIRATORY: No shortness of breath, cough or sputum.  GASTROINTESTINAL: No anorexia, nausea, vomiting or diarrhea. No abdominal pain or blood.  GENITOURINARY: No burning on urination, no polyuria NEUROLOGICAL: No headache, dizziness, syncope, paralysis, ataxia, numbness or tingling in the extremities. No change in bowel or bladder control.  MUSCULOSKELETAL: No muscle, back pain, joint pain or stiffness.  LYMPHATICS: No enlarged nodes. No history of splenectomy.  PSYCHIATRIC: No history of depression or anxiety.  ENDOCRINOLOGIC: No reports of sweating, cold or heat intolerance. No polyuria or polydipsia.  Marland Kitchen   Physical Examination Today's Vitals   06/13/23 1125  BP: (!) 168/78  Pulse: 84  SpO2: 95%  Weight: 264 lb (119.7 kg)  Height: 5\' 9"  (1.753 m)   Body mass index is 38.99 kg/m.  Gen: resting comfortably, no acute distress HEENT: no scleral icterus, pupils equal round and reactive, no palptable cervical adenopathy,  CV: RRR, no m/rg, no jvd Resp: Clear to auscultation bilaterally GI: abdomen is soft, non-tender, non-distended, normal bowel sounds, no hepatosplenomegaly MSK: extremities are warm, no edema.  Skin: warm, no rash Neuro:  no focal deficits Psych: appropriate affect   Diagnostic Studies  Echocardiogram on 07/17/2018 demonstrated normal left ventricular systolic function and regional wall motion, LVEF 60 to 65%, indeterminate grade diastolic dysfunction, moderate LVH, and mild left atrial  dilatation.     Cardiac catheterization 03/22/18:   Prox RCA lesion is 20%  stenosed. Prox LAD lesion is 80% stenosed. A drug-eluting stent was successfully placed using a STENT SIERRA 3.00 X 18 MM. Post intervention, there is a 0% residual stenosis. The left ventricular systolic function is normal. LV end diastolic pressure is normal. The left ventricular ejection fraction is 55-65% by visual estimate. There is no mitral valve regurgitation.   1. Severe stenosis proximal LAD 2. Successful PTCA/DES x 1 proximal LAD 3. Mild non-obstructive disease in the RCA\ 4. Normal LV systolic function   10/2019 echo 1. Left ventricular ejection fraction, by estimation, is 60 to 65%. The  left ventricle has normal function. The left ventricle has no regional  wall motion abnormalities. Left ventricular diastolic parameters are  consistent with Grade I diastolic  dysfunction (impaired relaxation).   2. Right ventricular systolic function is normal. The right ventricular  size is normal. Tricuspid regurgitation signal is inadequate for assessing  PA pressure.   3. The mitral valve is degenerative. No evidence of mitral valve  regurgitation. No evidence of mitral stenosis.   4. The aortic valve is tricuspid. Aortic valve regurgitation is not  visualized. No aortic stenosis is present.   5. The inferior vena cava is dilated in size with >50% respiratory  variability, suggesting right atrial pressure of 8 mmHg.    10/2020 echo IMPRESSIONS     1. Left ventricular ejection fraction, by estimation, is 60 to 65%. The  left ventricle has normal function. The left ventricle has no regional  wall motion abnormalities. Left ventricular diastolic parameters are  consistent with Grade I diastolic  dysfunction (impaired relaxation).   2. Right ventricular systolic function is normal. The right ventricular  size is normal.   3. Left atrial size was severely dilated.   4. Right atrial size was mildly dilated.   5. The mitral valve is normal in structure. No evidence of mitral valve   regurgitation. No evidence of mitral stenosis.   6. The aortic valve is tricuspid. There is mild calcification of the  aortic valve. There is mild thickening of the aortic valve. Aortic valve  regurgitation is not visualized. No aortic stenosis is present.   7. The inferior vena cava is normal in size with greater than 50%  respiratory variability, suggesting right atrial pressure of 3 mmHg.      01/2022 nuclear stress The study is normal. The study is low risk.   No ST deviation was noted.   LV perfusion is normal. There is no evidence of ischemia. There is no evidence of infarction.   Left ventricular function is normal.EF 75%  End diastolic cavity size is normal. End systolic cavity size is normal.     02/2022 14 day monitor 14 day monitor   Rare supraventricular ectopy in the form of isolated PACs, couplets, triplets. 6 runs of SVT longest 10 beats.   Rare ventricular ectopy in the form of isolated PVCs, couplets   Reported symptoms correlated with sinus rhythm, rare PACs and PVCs     Assessment and Plan   1.  Coronary artery disease/Chest pain - ongoing chest pains. Stress test last year was benign however since that time progression in frequency of pain as well as progressing of DOE. Reports pain is similar to before she had her stent years ago - given progressing symptoms will plan for catheterization for definitive evaluation of her coronaries   Informed Consent   Shared Decision  Making/Informed Consent The risks [stroke (1 in 1000), death (1 in 1000), kidney failure [usually temporary] (1 in 500), bleeding (1 in 200), allergic reaction [possibly serious] (1 in 200)], benefits (diagnostic support and management of coronary artery disease) and alternatives of a cardiac catheterization were discussed in detail with Ms. Trombly and she is willing to proceed.      2.HTN - some history of worsening renal function on ACEi in the past. She is reluctant for any bp medication that  may affect kidney function.  - already on CCB with verapamil for migraines - will start hydralazine 25mg  tid.    Antoine Poche, M.D.

## 2023-06-13 NOTE — Patient Instructions (Addendum)
Medication Instructions:  Your physician has recommended you make the following change in your medication:   -Start Hydralazine 25 mg tablets three times daily.   *If you need a refill on your cardiac medications before your next appointment, please call your pharmacy*   Lab Work: CBC BMET  If you have labs (blood work) drawn today and your tests are completely normal, you will receive your results only by: MyChart Message (if you have MyChart) OR A paper copy in the mail If you have any lab test that is abnormal or we need to change your treatment, we will call you to review the results.   Testing/Procedures: Left Heart Cath   Follow-Up: At [facility], you and your health needs are our priority.  As part of our continuing mission to provide you with exceptional heart care, we have created designated Provider Care Teams.  These Care Teams include your primary Cardiologist (physician) and Advanced Practice Providers (APPs -  Physician Assistants and Nurse Practitioners) who all work together to provide you with the care you need, when you need it.  We recommend signing up for the patient portal called "MyChart".  Sign up information is provided on this After Visit Summary.  MyChart is used to connect with patients for Virtual Visits (Telemedicine).  Patients are able to view lab/test results, encounter notes, upcoming appointments, etc.  Non-urgent messages can be sent to your provider as well.   To learn more about what you can do with MyChart, go to [email or web address].    Your next appointment:   3-4 week(s)  Provider:   You may see Dina Rich, MD or one of the following Advanced Practice Providers on your designated Care Team:   Randall An, PA-C  Jacolyn Reedy, [place]     Other Instructions  [facility] A DEPT OF [facility] [address]  [place] [address] Dept: [phone] Loc:  [phone]  ADONIA MANGER  06/13/2023  You are scheduled for a Cardiac Catheterization on Tuesday, November 19 with Dr. Alverda Skeans.  1. Please arrive at the [facility] (Main Entrance A) at [facility]: [address] [place], [place] [address] at 5:30 AM (This time is 2 hour(s) before your procedure to ensure your preparation). Free valet parking service is available. You will check in at ADMITTING. The support person will be asked to wait in the waiting room.  It is OK to have someone drop you off and come back when you are ready to be discharged.    Special note: Every effort is made to have your procedure done on time. Please understand that emergencies sometimes delay scheduled procedures.  2. Diet: Do not eat solid foods after midnight.  The patient may have clear liquids until 5am upon the day of the procedure.  3. Labs: You will need to have blood drawn TODAY You do not need to be fasting.  4. Medication instructions in preparation for your procedure:   Contrast Allergy: No  Hold Jardiance 3 days prior to your procedure.   Hold Kerendia the morning of your procedure.   Stop taking, Lasix (Furosemide)  Tuesday, November 19, may resume after procedure.   Take only half dose units of insulin the night before your procedure. Do not take any insulin on the day of the procedure.  Do not take Diabetes Med Glucophage (Metformin) on the day of the procedure and HOLD 48 HOURS AFTER THE  PROCEDURE.  On the morning of your procedure, take your Aspirin 81 mg and any morning medicines NOT listed above.  You may use sips of water.  5. Plan to go home the same day, you will only stay overnight if medically necessary. 6. Bring a current list of your medications and current insurance cards. 7. You MUST have a responsible person to drive you home. 8. Someone MUST be with you the first 24 hours after you arrive home or your discharge will be delayed. 9. Please wear clothes that  are easy to get on and off and wear slip-on shoes.  Thank you for allowing [place] to care for you!   -- [facility]

## 2023-06-13 NOTE — Progress Notes (Unsigned)
Clinical Summary Jodi Ray is a 62 y.o.female seen today for follow up of the following medical problems.    1. CAD - drug-eluting stent placement to the proximal LAD on 03/22/2018. - from notes has chronic nonspecific chest pains  - LV systolic function is normal with an EF of 60 to 65% by echocardiogram in December 2019  -01/2022 nuclear stress: no ischemia  - midchest to between shoulder blades, can vary in character sometimes sharp or pressure. 8/10 in severity. Can occur rest, more often with activity or exertion. Noticing some DOE that is progressing to lower levels activity. Pain can last 415 minutes. Not positional. Varies in frequency.  - feels similar to her pains before her last stent.     2. HTN - compliant with meds Had some issues with low bp's previously    -home bp's 140s-180s/60s-70s - nephrorolgoist had stopped lisinopril years ago per her report due to declining renal function - up 11 lbs since last year - started verapamil by neurologst for migraine headaches.       Other medical isuses not addressed this visit  3. Hyperlipidemia - upcoming labs with pcp - Jan 2022 TC 167 TG 211 LDL 86 01/2021 TC 157 TG 34 HDL 88 LDL 61   4. OSA - using cpap machine, followed by Dr Vickey Huger.    5. DM1 - followed by endocrinology   6. CKD 3 - followed by Dr Wolfgang Phoenix - had renal biopsy 06/28/2021. - from renal notes historically sensitive to diuretics - lasix managed by nephrology, takes 2 tables in AM and 1 tablet in PM. Swelling is much improved.      7. Chronic LBBB   8. Chronic diastolic HF  Echo LVEF 60-65%, grade I dd - diuretics per renal, from there note has historically been sensitive to diuretic dosing regarding her renal function   - no recent fluid issuess   9.Dizziness - symptoms would occur with laying down, worst with rolling over. Feeling of room spinning, nausea. Would get some dizziness with standing and walking. Intermittent blurry  vision - seen by ENT, thought not related.      - 02/2022 monitor rare ectopy, 6 runs SVT longest 10 beats 06/2022 CT head: no acute process. Atrophy and chronic small vessel ischemic changes  - ongoing workup by pcp, ENT.   10. Palpitations - - 02/2022 monitor rare ectopy, 6 runs SVT longest 10 beats Past Medical History:  Diagnosis Date   Adult RDS (HCC)    Anemia    Brain aneurysm    frontal lobe   CAD (coronary artery disease)    a. 02/2018: s/p DES to Proximal LAD with residual 20% RCA stenosis.   Chronic pain    Diabetes mellitus without complication (HCC)    Headache    History of left bundle Evangelene Vora block (LBBB)    HTN (hypertension)    Hx of cardiovascular stress test 10/2016   intermediate risk study   Hypothyroidism    Neuromuscular disorder (HCC)    Neuropathy    Obesity    OSA on CPAP 03/10/2015   Renal insufficiency    Retinopathy    Sleep apnea    Varicose veins of both lower extremities      Allergies  Allergen Reactions   Other Nausea And Vomiting and Other (See Comments)    Opiates Alters mental status diarrhea   Fentanyl Nausea And Vomiting and Other (See Comments)    Nausea, vomiting, loss of  consciousness, requiring reversal     Current Outpatient Medications  Medication Sig Dispense Refill   aspirin EC 81 MG tablet Take 1 tablet (81 mg total) by mouth daily. Swallow whole. 90 tablet 3   atorvastatin (LIPITOR) 80 MG tablet TAKE 1 TABLET BY MOUTH ONCE DAILY AT  6PM 90 tablet 2   calcitRIOL (ROCALTROL) 0.25 MCG capsule Take 0.25 mcg by mouth every other day.     Continuous Blood Gluc Sensor (DEXCOM G6 SENSOR) MISC 1 Device by Other route as directed.     desvenlafaxine (PRISTIQ) 50 MG 24 hr tablet Take 25 mg by mouth at bedtime.     EPINEPHRINE 0.3 mg/0.3 mL IJ SOAJ injection INJECT CONTENTS OF ONE PEN INTO THE MUSCLE AS NEEDED FOR ANAPHYLAXIS 2 each 0   fish oil-omega-3 fatty acids 1000 MG capsule Take 1 g by mouth in the morning and at  bedtime.     furosemide (LASIX) 20 MG tablet Take 20 mg by mouth. Take 2 tablets in the morning and 1 tablet in the evening     Insulin Human (INSULIN PUMP) SOLN Inject into the skin continuous.      Insulin Pen Needle (PEN NEEDLES 31GX5/16") 31G X 8 MM MISC      JARDIANCE 10 MG TABS tablet Take 5 mg by mouth every other day.     KERENDIA 10 MG TABS Take 1 tablet by mouth daily.     levothyroxine (SYNTHROID) 112 MCG tablet Take 112 mcg by mouth daily before breakfast.     lubiprostone (AMITIZA) 24 MCG capsule Take 1 capsule (24 mcg total) by mouth 2 (two) times daily with a meal. 60 capsule 3   metFORMIN (GLUCOPHAGE) 500 MG tablet Take 1 tablet (500 mg total) by mouth 2 (two) times daily with a meal.     nitroGLYCERIN (NITROSTAT) 0.4 MG SL tablet Place 1 tablet (0.4 mg total) under the tongue every 5 (five) minutes as needed for chest pain. 25 tablet 3   NOVOLOG 100 UNIT/ML injection Inject into the skin continuous. In insulin pump  0   pantoprazole (PROTONIX) 40 MG tablet Take 1 tablet (40 mg total) by mouth 2 (two) times daily before a meal. 60 tablet 3   verapamil (CALAN) 80 MG tablet Take 1 tablet (80 mg total) by mouth 3 (three) times daily. 90 tablet 5   No current facility-administered medications for this visit.     Past Surgical History:  Procedure Laterality Date   BIOPSY  11/14/2016   Procedure: BIOPSY;  Surgeon: West Bali, MD;  Location: [facility];  Service: Endoscopy;;  colon gastric duodenal   BIOPSY  03/19/2023   Procedure: BIOPSY;  Surgeon: Lanelle Bal, DO;  Location: [facility];  Service: Endoscopy;;   BREAST EXCISIONAL BIOPSY Right    BREAST SURGERY Left 1994   Lumpectomy   COLONOSCOPY WITH PROPOFOL N/A 11/14/2016   Dr. Darrick Penna: Moderately redundant rectosigmoid colon. Random colon biopsies benign. Internal hemorrhoids. Surveillance colonoscopy in 5 years.   COLONOSCOPY WITH PROPOFOL N/A 04/20/2022   Procedure: COLONOSCOPY WITH PROPOFOL;  Surgeon:  Lanelle Bal, DO;  Location: [facility];  Service: Endoscopy;  Laterality: N/A;  12:30am   CORONARY STENT INTERVENTION N/A 03/22/2018   Procedure: CORONARY STENT INTERVENTION;  Surgeon: Kathleene Hazel, MD;  Location: [facility];  Service: Cardiovascular;  Laterality: N/A;   ESOPHAGOGASTRODUODENOSCOPY (EGD) WITH PROPOFOL N/A 11/14/2016   Dr. Darrick Penna: Esophagus normal. Moderate gastritis, few gastric polyps. Duodenal biopsies  negative. Fundic gland polyp gastric polyp area no H pylori.   ESOPHAGOGASTRODUODENOSCOPY (EGD) WITH PROPOFOL N/A 03/19/2023   Procedure: ESOPHAGOGASTRODUODENOSCOPY (EGD) WITH PROPOFOL;  Surgeon: Lanelle Bal, DO;  Location: [facility];  Service: Endoscopy;  Laterality: N/A;  10:15 am, asa 3   FOOT SURGERY     LEFT HEART CATH AND CORONARY ANGIOGRAPHY N/A 03/22/2018   Procedure: LEFT HEART CATH AND CORONARY ANGIOGRAPHY;  Surgeon: Kathleene Hazel, MD;  Location: [facility];  Service: Cardiovascular;  Laterality: N/A;   LEG SURGERY Right    POLYPECTOMY  04/20/2022   Procedure: POLYPECTOMY;  Surgeon: Lanelle Bal, DO;  Location: [facility];  Service: Endoscopy;;   REPLACEMENT TOTAL KNEE Right    SPINAL CORD STIMULATOR IMPLANT       Allergies  Allergen Reactions   Other Nausea And Vomiting and Other (See Comments)    Opiates Alters mental status diarrhea   Fentanyl Nausea And Vomiting and Other (See Comments)    Nausea, vomiting, loss of consciousness, requiring reversal      Family History  Problem Relation Age of Onset   Cancer Mother 68       colon   Cancer Father        renal cell   Colon cancer Father    Breast cancer Sister    Cancer Sister        primary brain, and primary breast too   Breast cancer Sister    Cancer Sister        breast    Breast cancer Sister    Breast cancer Sister    Cancer Brother        kidney and liver   Sleep apnea Neg Hx      Social History Ms. Grider reports that  she has never smoked. She has never used smokeless tobacco. Ms. Generette reports no history of alcohol use.   Review of Systems CONSTITUTIONAL: No weight loss, fever, chills, weakness or fatigue.  HEENT: Eyes: No visual loss, blurred vision, double vision or yellow sclerae.No hearing loss, sneezing, congestion, runny nose or sore throat.  SKIN: No rash or itching.  CARDIOVASCULAR: per hpi RESPIRATORY: No shortness of breath, cough or sputum.  GASTROINTESTINAL: No anorexia, nausea, vomiting or diarrhea. No abdominal pain or blood.  GENITOURINARY: No burning on urination, no polyuria NEUROLOGICAL: No headache, dizziness, syncope, paralysis, ataxia, numbness or tingling in the extremities. No change in bowel or bladder control.  MUSCULOSKELETAL: No muscle, back pain, joint pain or stiffness.  LYMPHATICS: No enlarged nodes. No history of splenectomy.  PSYCHIATRIC: No history of depression or anxiety.  ENDOCRINOLOGIC: No reports of sweating, cold or heat intolerance. No polyuria or polydipsia.  Marland Kitchen   Physical Examination Today's Vitals   06/13/23 1125  BP: (!) 168/78  Pulse: 84  SpO2: 95%  Weight: 264 lb (119.7 kg)  Height: 5\' 9"  (1.753 m)   Body mass index is 38.99 kg/m.  Gen: resting comfortably, no acute distress HEENT: no scleral icterus, pupils equal round and reactive, no palptable cervical adenopathy,  CV: RRR, no m/rg, no jvd Resp: Clear to auscultation bilaterally GI: abdomen is soft, non-tender, non-distended, normal bowel sounds, no hepatosplenomegaly MSK: extremities are warm, no edema.  Skin: warm, no rash Neuro:  no focal deficits Psych: appropriate affect   Diagnostic Studies  Echocardiogram on 07/17/2018 demonstrated normal left ventricular systolic function and regional wall motion, LVEF 60 to 65%, indeterminate grade diastolic dysfunction, moderate LVH, and mild left atrial  dilatation.     Cardiac catheterization 03/22/18:   Prox RCA lesion is 20%  stenosed. Prox LAD lesion is 80% stenosed. A drug-eluting stent was successfully placed using a STENT SIERRA 3.00 X 18 MM. Post intervention, there is a 0% residual stenosis. The left ventricular systolic function is normal. LV end diastolic pressure is normal. The left ventricular ejection fraction is 55-65% by visual estimate. There is no mitral valve regurgitation.   1. Severe stenosis proximal LAD 2. Successful PTCA/DES x 1 proximal LAD 3. Mild non-obstructive disease in the RCA\ 4. Normal LV systolic function   10/2019 echo 1. Left ventricular ejection fraction, by estimation, is 60 to 65%. The  left ventricle has normal function. The left ventricle has no regional  wall motion abnormalities. Left ventricular diastolic parameters are  consistent with Grade I diastolic  dysfunction (impaired relaxation).   2. Right ventricular systolic function is normal. The right ventricular  size is normal. Tricuspid regurgitation signal is inadequate for assessing  PA pressure.   3. The mitral valve is degenerative. No evidence of mitral valve  regurgitation. No evidence of mitral stenosis.   4. The aortic valve is tricuspid. Aortic valve regurgitation is not  visualized. No aortic stenosis is present.   5. The inferior vena cava is dilated in size with >50% respiratory  variability, suggesting right atrial pressure of 8 mmHg.    10/2020 echo IMPRESSIONS     1. Left ventricular ejection fraction, by estimation, is 60 to 65%. The  left ventricle has normal function. The left ventricle has no regional  wall motion abnormalities. Left ventricular diastolic parameters are  consistent with Grade I diastolic  dysfunction (impaired relaxation).   2. Right ventricular systolic function is normal. The right ventricular  size is normal.   3. Left atrial size was severely dilated.   4. Right atrial size was mildly dilated.   5. The mitral valve is normal in structure. No evidence of mitral valve   regurgitation. No evidence of mitral stenosis.   6. The aortic valve is tricuspid. There is mild calcification of the  aortic valve. There is mild thickening of the aortic valve. Aortic valve  regurgitation is not visualized. No aortic stenosis is present.   7. The inferior vena cava is normal in size with greater than 50%  respiratory variability, suggesting right atrial pressure of 3 mmHg.      01/2022 nuclear stress The study is normal. The study is low risk.   No ST deviation was noted.   LV perfusion is normal. There is no evidence of ischemia. There is no evidence of infarction.   Left ventricular function is normal.EF 75%  End diastolic cavity size is normal. End systolic cavity size is normal.     02/2022 14 day monitor 14 day monitor   Rare supraventricular ectopy in the form of isolated PACs, couplets, triplets. 6 runs of SVT longest 10 beats.   Rare ventricular ectopy in the form of isolated PVCs, couplets   Reported symptoms correlated with sinus rhythm, rare PACs and PVCs     Assessment and Plan   1.  Coronary artery disease/Chest pain - ongoing chest pains. Stress test last year was benign however since that time progression in frequency of pain as well as progressing of DOE. Reports pain is similar to before she had her stent years ago - given progressing symptoms will plan for catheterization for definitive evaluation of her coronaries   Informed Consent   Shared Decision  Making/Informed Consent The risks [stroke (1 in 1000), death (1 in 1000), kidney failure [usually temporary] (1 in 500), bleeding (1 in 200), allergic reaction [possibly serious] (1 in 200)], benefits (diagnostic support and management of coronary artery disease) and alternatives of a cardiac catheterization were discussed in detail with Ms. Trombly and she is willing to proceed.      2.HTN - some history of worsening renal function on ACEi in the past. She is reluctant for any bp medication that  may affect kidney function.  - already on CCB with verapamil for migraines - will start hydralazine 25mg  tid.    Antoine Poche, M.D.

## 2023-06-14 ENCOUNTER — Other Ambulatory Visit: Payer: Self-pay | Admitting: Cardiology

## 2023-06-14 DIAGNOSIS — R079 Chest pain, unspecified: Secondary | ICD-10-CM

## 2023-06-14 DIAGNOSIS — F330 Major depressive disorder, recurrent, mild: Secondary | ICD-10-CM | POA: Diagnosis not present

## 2023-06-18 ENCOUNTER — Telehealth: Payer: Self-pay | Admitting: *Deleted

## 2023-06-18 DIAGNOSIS — E039 Hypothyroidism, unspecified: Secondary | ICD-10-CM | POA: Diagnosis not present

## 2023-06-18 DIAGNOSIS — E109 Type 1 diabetes mellitus without complications: Secondary | ICD-10-CM | POA: Diagnosis not present

## 2023-06-18 NOTE — Telephone Encounter (Signed)
Reviewed procedure instructions with patient.  

## 2023-06-18 NOTE — Telephone Encounter (Addendum)
Cardiac Catheterization scheduled at [facility] for: Tuesday June 19, 2023 7:30 AM Arrival time [facility] Main Entrance A at: 5:30 AM  Nothing to eat after midnight prior to procedure, clear liquids until 5 AM day of procedure.  Medication instructions: -Hold:  Insulin pump-pt will manage  Metformin-day of procedure and 48 hours post procedure  Jardiance-AM of procedure  Lasix-AM of procedure -Other usual morning medications can be taken with sips of water including aspirin 81 mg.  Plan to go home the same day, you will only stay overnight if medically necessary.  You must have responsible adult to drive you home.  Someone must be with you the first 24 hours after you arrive home.  Left message for patient to call back to review procedure instructions

## 2023-06-18 NOTE — Telephone Encounter (Signed)
No answer

## 2023-06-19 ENCOUNTER — Other Ambulatory Visit: Payer: Self-pay

## 2023-06-19 ENCOUNTER — Encounter (HOSPITAL_COMMUNITY)
Admission: RE | Disposition: A | Discharge status: Home / Self Care | Payer: Self-pay | Source: Home / Self Care | Attending: Internal Medicine

## 2023-06-19 ENCOUNTER — Ambulatory Visit (HOSPITAL_COMMUNITY)
Admission: RE | Admit: 2023-06-19 | Discharge: 2023-06-19 | Disposition: A | Discharge status: Home / Self Care | Payer: Medicare Other | Attending: Internal Medicine | Admitting: Internal Medicine

## 2023-06-19 DIAGNOSIS — Z794 Long term (current) use of insulin: Secondary | ICD-10-CM | POA: Insufficient documentation

## 2023-06-19 DIAGNOSIS — Y832 Surgical operation with anastomosis, bypass or graft as the cause of abnormal reaction of the patient, or of later complication, without mention of misadventure at the time of the procedure: Secondary | ICD-10-CM | POA: Insufficient documentation

## 2023-06-19 DIAGNOSIS — R0609 Other forms of dyspnea: Secondary | ICD-10-CM | POA: Diagnosis not present

## 2023-06-19 DIAGNOSIS — G4733 Obstructive sleep apnea (adult) (pediatric): Secondary | ICD-10-CM | POA: Diagnosis not present

## 2023-06-19 DIAGNOSIS — Z955 Presence of coronary angioplasty implant and graft: Secondary | ICD-10-CM | POA: Diagnosis not present

## 2023-06-19 DIAGNOSIS — E1022 Type 1 diabetes mellitus with diabetic chronic kidney disease: Secondary | ICD-10-CM | POA: Diagnosis not present

## 2023-06-19 DIAGNOSIS — I2510 Atherosclerotic heart disease of native coronary artery without angina pectoris: Secondary | ICD-10-CM | POA: Diagnosis not present

## 2023-06-19 DIAGNOSIS — R002 Palpitations: Secondary | ICD-10-CM | POA: Insufficient documentation

## 2023-06-19 DIAGNOSIS — Z79899 Other long term (current) drug therapy: Secondary | ICD-10-CM | POA: Diagnosis not present

## 2023-06-19 DIAGNOSIS — I447 Left bundle-branch block, unspecified: Secondary | ICD-10-CM | POA: Diagnosis not present

## 2023-06-19 DIAGNOSIS — T82855A Stenosis of coronary artery stent, initial encounter: Secondary | ICD-10-CM | POA: Diagnosis not present

## 2023-06-19 DIAGNOSIS — R42 Dizziness and giddiness: Secondary | ICD-10-CM | POA: Insufficient documentation

## 2023-06-19 DIAGNOSIS — Z7984 Long term (current) use of oral hypoglycemic drugs: Secondary | ICD-10-CM | POA: Diagnosis not present

## 2023-06-19 DIAGNOSIS — R079 Chest pain, unspecified: Secondary | ICD-10-CM | POA: Insufficient documentation

## 2023-06-19 DIAGNOSIS — N183 Chronic kidney disease, stage 3 unspecified: Secondary | ICD-10-CM | POA: Diagnosis not present

## 2023-06-19 DIAGNOSIS — E785 Hyperlipidemia, unspecified: Secondary | ICD-10-CM | POA: Insufficient documentation

## 2023-06-19 DIAGNOSIS — I130 Hypertensive heart and chronic kidney disease with heart failure and stage 1 through stage 4 chronic kidney disease, or unspecified chronic kidney disease: Secondary | ICD-10-CM | POA: Insufficient documentation

## 2023-06-19 DIAGNOSIS — I5032 Chronic diastolic (congestive) heart failure: Secondary | ICD-10-CM | POA: Diagnosis not present

## 2023-06-19 HISTORY — PX: CORONARY PRESSURE/FFR STUDY: CATH118243

## 2023-06-19 HISTORY — PX: LEFT HEART CATH AND CORONARY ANGIOGRAPHY: CATH118249

## 2023-06-19 LAB — GLUCOSE, CAPILLARY
Glucose-Capillary: 159 mg/dL — ABNORMAL HIGH (ref 70–99)
Glucose-Capillary: 116 mg/dL — ABNORMAL HIGH (ref 70–99)

## 2023-06-19 LAB — POCT ACTIVATED CLOTTING TIME: Activated Clotting Time: 314 s

## 2023-06-19 SURGERY — LEFT HEART CATH AND CORONARY ANGIOGRAPHY
Anesthesia: LOCAL

## 2023-06-19 MED ORDER — SODIUM CHLORIDE 0.9 % WEIGHT BASED INFUSION: 1.0000 mL/kg/h | INTRAVENOUS | Status: DC

## 2023-06-19 MED ORDER — SODIUM CHLORIDE 0.9% FLUSH: 3.0000 mL | Freq: Two times a day (BID) | INTRAVENOUS | Status: DC

## 2023-06-19 MED ORDER — VERAPAMIL HCL 2.5 MG/ML IV SOLN
INTRAVENOUS | Status: AC
  Filled 2023-06-19: qty 2

## 2023-06-19 MED ORDER — MIDAZOLAM HCL 2 MG/2ML IJ SOLN
INTRAMUSCULAR | Status: AC
  Filled 2023-06-19: qty 2

## 2023-06-19 MED ORDER — ONDANSETRON HCL 4 MG/2ML IJ SOLN: 4.0000 mg | Freq: Four times a day (QID) | INTRAMUSCULAR | Status: DC | PRN

## 2023-06-19 MED ORDER — HYDRALAZINE HCL 20 MG/ML IJ SOLN: 10.0000 mg | INTRAMUSCULAR | Status: DC | PRN

## 2023-06-19 MED ORDER — SODIUM CHLORIDE 0.9 % IV SOLN
INTRAVENOUS | Status: DC
Start: 2023-06-19 — End: 2023-06-19

## 2023-06-19 MED ORDER — LIDOCAINE HCL (PF) 1 % IJ SOLN
INTRAMUSCULAR | Status: AC
  Filled 2023-06-19: qty 30

## 2023-06-19 MED ORDER — HEPARIN (PORCINE) IN NACL 1000-0.9 UT/500ML-% IV SOLN
INTRAVENOUS | Status: DC | PRN
  Administered 2023-06-19 (×2): 500 mL via INTRA_ARTERIAL

## 2023-06-19 MED ORDER — VERAPAMIL HCL 2.5 MG/ML IV SOLN
INTRAVENOUS | Status: DC | PRN
  Administered 2023-06-19: 10 mL via INTRA_ARTERIAL

## 2023-06-19 MED ORDER — ACETAMINOPHEN 325 MG PO TABS: 650.0000 mg | ORAL_TABLET | ORAL | Status: DC | PRN

## 2023-06-19 MED ORDER — HEPARIN SODIUM (PORCINE) 1000 UNIT/ML IJ SOLN
INTRAMUSCULAR | Status: DC | PRN
  Administered 2023-06-19: 5000 [IU] via INTRA_ARTERIAL
  Administered 2023-06-19: 7000 [IU] via INTRA_ARTERIAL

## 2023-06-19 MED ORDER — ASPIRIN 81 MG PO CHEW: 81.0000 mg | CHEWABLE_TABLET | ORAL | Status: DC

## 2023-06-19 MED ORDER — SODIUM CHLORIDE 0.9 % IV SOLN: 250.0000 mL | INTRAVENOUS | Status: DC | PRN

## 2023-06-19 MED ORDER — IOHEXOL 350 MG/ML SOLN
INTRAVENOUS | Status: DC | PRN
  Administered 2023-06-19: 70 mL

## 2023-06-19 MED ORDER — LIDOCAINE HCL (PF) 1 % IJ SOLN
INTRAMUSCULAR | Status: DC | PRN
  Administered 2023-06-19: 2 mL

## 2023-06-19 MED ORDER — LABETALOL HCL 5 MG/ML IV SOLN: 10.0000 mg | INTRAVENOUS | Status: DC | PRN

## 2023-06-19 MED ORDER — HEPARIN SODIUM (PORCINE) 1000 UNIT/ML IJ SOLN
INTRAMUSCULAR | Status: AC
  Filled 2023-06-19: qty 10

## 2023-06-19 MED ORDER — ADENOSINE 12 MG/4ML IV SOLN
INTRAVENOUS | Status: AC
  Filled 2023-06-19: qty 16

## 2023-06-19 MED ORDER — SODIUM CHLORIDE 0.9% FLUSH: 3.0000 mL | INTRAVENOUS | Status: DC | PRN

## 2023-06-19 MED ORDER — MIDAZOLAM HCL 2 MG/2ML IJ SOLN
INTRAMUSCULAR | Status: DC | PRN
  Administered 2023-06-19 (×2): 2 mg via INTRAVENOUS
  Administered 2023-06-19: 1 mg via INTRAVENOUS

## 2023-06-19 MED ORDER — SODIUM CHLORIDE 0.9 % WEIGHT BASED INFUSION
3.0000 mL/kg/h | INTRAVENOUS | Status: AC
  Administered 2023-06-19: 3 mL/kg/h via INTRAVENOUS

## 2023-06-19 MED ORDER — ADENOSINE (DIAGNOSTIC) 140MCG/KG/MIN
INTRAVENOUS | Status: DC | PRN
  Administered 2023-06-19: 140 ug/kg/min via INTRAVENOUS

## 2023-06-19 SURGICAL SUPPLY — 12 items
CATH INFINITI 5FR ANG PIGTAIL (CATHETERS) IMPLANT
CATH INFINITI AMBI 6FR TG (CATHETERS) IMPLANT
CATH LAUNCHER 6FR EBU3.5 (CATHETERS) IMPLANT
DEVICE RAD COMP TR BAND LRG (VASCULAR PRODUCTS) IMPLANT
GLIDESHEATH SLEND SS 6F .021 (SHEATH) IMPLANT
GUIDEWIRE PRESSURE X 175 (WIRE) IMPLANT
KIT HEMO VALVE WATCHDOG (MISCELLANEOUS) IMPLANT
PACK CARDIAC CATHETERIZATION (CUSTOM PROCEDURE TRAY) ×1 IMPLANT
SET ATX-X65L (MISCELLANEOUS) IMPLANT
SHEATH PROBE COVER 6X72 (BAG) IMPLANT
SHIELD RADPAD SCOOP 12X17 (MISCELLANEOUS) IMPLANT
WIRE EMERALD 3MM-J .035X260CM (WIRE) IMPLANT

## 2023-06-19 NOTE — Interval H&P Note (Signed)
History and Physical Interval Note:  06/19/2023 6:32 AM  Jodi Ray  has presented today for surgery, with the diagnosis of chest pain.  The various methods of treatment have been discussed with the patient and family. After consideration of risks, benefits and other options for treatment, the patient has consented to  Procedure(s): LEFT HEART CATH AND CORONARY ANGIOGRAPHY (N/A) as a surgical intervention.  The patient's history has been reviewed, patient examined, no change in status, stable for surgery.  I have reviewed the patient's chart and labs.  Questions were answered to the patient's satisfaction.     Orbie Pyo

## 2023-06-19 NOTE — Discharge Instructions (Addendum)
Restart metformin on 06/21/23

## 2023-06-20 ENCOUNTER — Encounter (HOSPITAL_COMMUNITY): Payer: Self-pay | Admitting: Internal Medicine

## 2023-06-21 ENCOUNTER — Encounter: Payer: Self-pay | Admitting: Internal Medicine

## 2023-06-21 ENCOUNTER — Ambulatory Visit: Payer: Medicare Other | Admitting: Internal Medicine

## 2023-06-21 VITALS — BP 146/65 | HR 86 | Temp 97.5°F | Ht 68.0 in | Wt 268.6 lb

## 2023-06-21 DIAGNOSIS — R110 Nausea: Secondary | ICD-10-CM

## 2023-06-21 DIAGNOSIS — R140 Abdominal distension (gaseous): Secondary | ICD-10-CM

## 2023-06-21 DIAGNOSIS — R1032 Left lower quadrant pain: Secondary | ICD-10-CM

## 2023-06-21 DIAGNOSIS — K581 Irritable bowel syndrome with constipation: Secondary | ICD-10-CM

## 2023-06-21 DIAGNOSIS — R1084 Generalized abdominal pain: Secondary | ICD-10-CM

## 2023-06-21 DIAGNOSIS — K219 Gastro-esophageal reflux disease without esophagitis: Secondary | ICD-10-CM

## 2023-06-21 NOTE — Progress Notes (Signed)
[facility] - Clinic Note  07/02/2023     CHIEF COMPLAINT Patient presents for Retina Follow Up   HISTORY OF PRESENT ILLNESS: Jodi Ray is a 62 y.o. female who presents to the clinic today for:   HPI     Retina Follow Up   Patient presents with  Diabetic Retinopathy.  In both eyes.  This started 6 weeks ago.  I, the attending physician,  performed the HPI with the patient and updated documentation appropriately.        Comments   Patient here for 6 weeks retina follow up for PDR OU. Patient states vision doing good. OS is still bothersome. Dry eyes. Was just over a cold. Takes Alprazolam before procedure. Blood sugar this am 88.      Last edited by Rennis Chris, MD on 07/02/2023 11:46 PM.     Referring physician: Kirstie Peri, MD [address] [place],  [place] [address]  HISTORICAL INFORMATION:   Selected notes from the MEDICAL RECORD NUMBER Referred by Dr. [name] for concern of DME LEE:  Ocular Hx- PMH-    CURRENT MEDICATIONS: No current outpatient medications on file. (Ophthalmic Drugs)   No current facility-administered medications for this visit. (Ophthalmic Drugs)   Current Outpatient Medications (Other)  Medication Sig   aspirin EC 81 MG tablet Take 1 tablet (81 mg total) by mouth daily. Swallow whole.   atorvastatin (LIPITOR) 80 MG tablet TAKE 1 TABLET BY MOUTH ONCE DAILY AT  6PM   calcitRIOL (ROCALTROL) 0.25 MCG capsule Take 0.25 mcg by mouth every Monday, Wednesday, and Friday.   Continuous Blood Gluc Sensor (DEXCOM G6 SENSOR) MISC 1 Device by Other route as directed.   desvenlafaxine (PRISTIQ) 25 MG 24 hr tablet Take 25 mg by mouth every evening.   EPINEPHRINE 0.3 mg/0.3 mL IJ SOAJ injection INJECT CONTENTS OF ONE PEN INTO THE MUSCLE AS NEEDED FOR ANAPHYLAXIS   furosemide (LASIX) 20 MG tablet Take 20-40 mg by mouth See admin instructions. Take 2 tablets in the morning and 1 tablet in the evening   hydrALAZINE (APRESOLINE) 25 MG  tablet Take 1 tablet (25 mg total) by mouth 3 (three) times daily.   Insulin Human (INSULIN PUMP) SOLN Inject into the skin continuous.    Insulin Pen Needle (PEN NEEDLES 31GX5/16") 31G X 8 MM MISC    JARDIANCE 10 MG TABS tablet Take 5 mg by mouth every Monday, Wednesday, and Friday.   KERENDIA 10 MG TABS Take 10 mg by mouth every Monday, Wednesday, and Friday.   levothyroxine (SYNTHROID) 112 MCG tablet Take 112 mcg by mouth daily before breakfast.   lubiprostone (AMITIZA) 24 MCG capsule Take 1 capsule (24 mcg total) by mouth 2 (two) times daily with a meal. (Patient taking differently: Take 24 mcg by mouth 3 (three) times daily.)   metFORMIN (GLUCOPHAGE) 500 MG tablet Take 1 tablet (500 mg total) by mouth 2 (two) times daily with a meal.   nitroGLYCERIN (NITROSTAT) 0.4 MG SL tablet Place 1 tablet (0.4 mg total) under the tongue every 5 (five) minutes as needed for chest pain.   NOVOLOG 100 UNIT/ML injection Inject into the skin continuous. In insulin pump   Omega-3 Fatty Acids (FISH OIL) 1200 MG CAPS Take 1,200 mg by mouth daily.   pantoprazole (PROTONIX) 40 MG tablet Take 1 tablet (40 mg total) by mouth 2 (two) times daily before a meal. (Patient taking differently: Take 40 mg by mouth daily.)   verapamil (CALAN) 80 MG  tablet Take 1 tablet (80 mg total) by mouth 3 (three) times daily.   No current facility-administered medications for this visit. (Other)   REVIEW OF SYSTEMS: ROS   Positive for: Endocrine, Cardiovascular, Eyes Negative for: Constitutional, Gastrointestinal, Neurological, Skin, Genitourinary, Musculoskeletal, HENT, Respiratory, Psychiatric, Allergic/Imm, Heme/Lymph Last edited by Laddie Aquas, COA on 07/02/2023  8:36 AM.     ALLERGIES Allergies  Allergen Reactions   Other Nausea And Vomiting and Other (See Comments)    Opiates Alters mental status diarrhea   Fentanyl Nausea And Vomiting and Other (See Comments)    Nausea, vomiting, loss of consciousness, requiring  reversal   PAST MEDICAL HISTORY Past Medical History:  Diagnosis Date   Adult RDS (HCC)    Anemia    Brain aneurysm    frontal lobe   CAD (coronary artery disease)    a. 02/2018: s/p DES to Proximal LAD with residual 20% RCA stenosis.   Chronic pain    Diabetes mellitus without complication (HCC)    Headache    History of left bundle branch block (LBBB)    HTN (hypertension)    Hx of cardiovascular stress test 10/2016   intermediate risk study   Hypothyroidism    Neuromuscular disorder (HCC)    Neuropathy    Obesity    OSA on CPAP 03/10/2015   Renal insufficiency    Retinopathy    Sleep apnea    Varicose veins of both lower extremities    Past Surgical History:  Procedure Laterality Date   BIOPSY  11/14/2016   Procedure: BIOPSY;  Surgeon: West Bali, MD;  Location: [facility];  Service: Endoscopy;;  colon gastric duodenal   BIOPSY  03/19/2023   Procedure: BIOPSY;  Surgeon: Lanelle Bal, DO;  [facility]: [facility];  Service: Endoscopy;;   BREAST EXCISIONAL BIOPSY Right    BREAST SURGERY Left 1994   Lumpectomy   COLONOSCOPY WITH PROPOFOL N/A 11/14/2016   Dr. Darrick Penna: Moderately redundant rectosigmoid colon. Random colon biopsies benign. Internal hemorrhoids. Surveillance colonoscopy in 5 years.   COLONOSCOPY WITH PROPOFOL N/A 04/20/2022   Procedure: COLONOSCOPY WITH PROPOFOL;  Surgeon: Lanelle Bal, DO;  [facility]: [facility];  Service: Endoscopy;  Laterality: N/A;  12:30am   CORONARY PRESSURE/FFR STUDY N/A 06/19/2023   Procedure: CORONARY PRESSURE/FFR STUDY;  Surgeon: Orbie Pyo, MD;  Location: [facility];  Service: Cardiovascular;  Laterality: N/A;   CORONARY STENT INTERVENTION N/A 03/22/2018   Procedure: CORONARY STENT INTERVENTION;  Surgeon: Kathleene Hazel, MD;  Location: [facility];  Service: Cardiovascular;  Laterality: N/A;   ESOPHAGOGASTRODUODENOSCOPY (EGD) WITH PROPOFOL N/A 11/14/2016   Dr. Darrick Penna: Esophagus  normal. Moderate gastritis, few gastric polyps. Duodenal biopsies negative. Fundic gland polyp gastric polyp area no H pylori.   ESOPHAGOGASTRODUODENOSCOPY (EGD) WITH PROPOFOL N/A 03/19/2023   Procedure: ESOPHAGOGASTRODUODENOSCOPY (EGD) WITH PROPOFOL;  Surgeon: Lanelle Bal, DO;  [facility]: [facility];  Service: Endoscopy;  Laterality: N/A;  10:15 am, asa 3   FOOT SURGERY     LEFT HEART CATH AND CORONARY ANGIOGRAPHY N/A 03/22/2018   Procedure: LEFT HEART CATH AND CORONARY ANGIOGRAPHY;  Surgeon: Kathleene Hazel, MD;  [facility]: [facility];  Service: Cardiovascular;  Laterality: N/A;   LEFT HEART CATH AND CORONARY ANGIOGRAPHY N/A 06/19/2023   Procedure: LEFT HEART CATH AND CORONARY ANGIOGRAPHY;  Surgeon: Orbie Pyo, MD;  [facility]: [facility];  Service: Cardiovascular;  Laterality: N/A;   LEG SURGERY  Right    POLYPECTOMY  04/20/2022   Procedure: POLYPECTOMY;  Surgeon: Lanelle Bal, DO;  [facility]: [facility];  Service: Endoscopy;;   REPLACEMENT TOTAL KNEE Right    SPINAL CORD STIMULATOR IMPLANT     FAMILY HISTORY Family History  Problem Relation Age of Onset   Cancer Mother 33       colon   Cancer Father        renal cell   Colon cancer Father    Breast cancer Sister    Cancer Sister        primary brain, and primary breast too   Breast cancer Sister    Cancer Sister        breast    Breast cancer Sister    Breast cancer Sister    Cancer Brother        kidney and liver   Sleep apnea Neg Hx    SOCIAL HISTORY Social History   Tobacco Use   Smoking status: Never   Smokeless tobacco: Never  Vaping Use   Vaping status: Never Used  Substance Use Topics   Alcohol use: No    Alcohol/week: 0.0 standard drinks of alcohol   Drug use: No       OPHTHALMIC EXAM:  Base Eye Exam     Visual Acuity (Snellen - Linear)       Right Left   Dist cc 20/30 -2 20/40   Dist ph cc 20/30 +2 20/20 -2    Correction: Glasses         Tonometry  (Tonopen, 8:33 AM)       Right Left   Pressure 15 16         Pupils       Dark Light Shape React APD   Right 3 2 Round Brisk None   Left 3 2 Round Brisk None         Visual Fields (Counting fingers)       Left Right    Full Full         Extraocular Movement       Right Left    Full, Ortho Full, Ortho         Neuro/Psych     Oriented x3: Yes   Mood/Affect: Normal         Dilation     Both eyes: 1.0% Mydriacyl, 2.5% Phenylephrine @ 8:33 AM           Slit Lamp and Fundus Exam     Slit Lamp Exam       Right Left   Lids/Lashes Dermatochalasis - upper lid, mild MGD Dermatochalasis - upper lid, mild MGD   Conjunctiva/Sclera nasal pingeucula nasal pingeucula, Trace Injection   Cornea arcus, mild tear film debris arcus, mild tear film debris   Anterior Chamber deep and clear, no cell or flare deep and clear, no cell or flare   Iris Round and dilated, No NVI Round and dilated, No NVI   Lens 2+ Nuclear sclerosis, 2-3+ Cortical cataract, 1+ Posterior subcapsular cataract 2+ Nuclear sclerosis, 2-3+ Cortical cataract, 1+ Posterior subcapsular cataract   Anterior Vitreous Vitreous syneresis Vitreous syneresis         Fundus Exam       Right Left   Disc Pink and Sharp, no NVD, temporal PPA Pink and Sharp, no NVD, temporal PPP   C/D Ratio 0.4 0.4   Macula Good foveal reflex, +cystic changes- slightly increased, scattered MA -- improved Flat, good  foveal reflex, scattered MA -- improved, trace cystic changes- slightly improved   Vessels attenuated, Tortuous attenuated, Tortuous, +NV -- regressing   Periphery Attached, dense 360 PRP extending posteriorly to arcades Attached, 360 PRP with good posterior fill in, room for fill in if needed           Refraction     Wearing Rx       Sphere Cylinder Axis Add   Right +1.75 +1.50 124 +2.50   Left +4.25 +2.25 001 +2.50           IMAGING AND PROCEDURES  Imaging and Procedures for 07/02/2023  OCT,  Retina - OU - Both Eyes       Right Eye Quality was good. Central Foveal Thickness: 371. Progression has worsened. Findings include normal foveal contour, no SRF, intraretinal fluid (Interval increased in cystic changes centrally ).   Left Eye Quality was good. Central Foveal Thickness: 300. Progression has improved. Findings include normal foveal contour, no SRF, intraretinal fluid (persistent cystic changes greatest temporal macula -- improved).   Notes *Images captured and stored on drive  Diagnosis / Impression:  +DME OU OD: Persistent cystic changes centrally -- slightly increased OS: persistent cystic changes greatest temporal macula -- improved  Clinical management:  See below  Abbreviations: NFP - Normal foveal profile. CME - cystoid macular edema. PED - pigment epithelial detachment. IRF - intraretinal fluid. SRF - subretinal fluid. EZ - ellipsoid zone. ERM - epiretinal membrane. ORA - outer retinal atrophy. ORT - outer retinal tubulation. SRHM - subretinal hyper-reflective material. IRHM - intraretinal hyper-reflective material      Intravitreal Injection, Pharmacologic Agent - OD - Right Eye       Time Out 07/02/2023. 9:20 AM. Confirmed correct patient, procedure, site, and patient consented.   Anesthesia Topical anesthesia was used. Anesthetic medications included Lidocaine 2%, Proparacaine 0.5%.   Procedure Preparation included 5% betadine to ocular surface, eyelid speculum. A (32g) needle was used.   Injection: 2 mg aflibercept 2 MG/0.05ML   Route: Intravitreal, Site: Right Eye   NDC: L6038910, Lot: [id number], Expiration date: 10/28/2024, Waste: 0 mL   Post-op Post injection exam found visual acuity of at least counting fingers. The patient tolerated the procedure well. There were no complications. The patient received written and verbal post procedure care education.      Intravitreal Injection, Pharmacologic Agent - OS - Left Eye       Time  Out 07/02/2023. 9:20 AM. Confirmed correct patient, procedure, site, and patient consented.   Anesthesia Topical anesthesia was used. Anesthetic medications included Lidocaine 2%, Proparacaine 0.5%.   Procedure Preparation included 5% betadine to ocular surface, eyelid speculum. A (32g) needle was used.   Injection: 2 mg aflibercept 2 MG/0.05ML   Route: Intravitreal, Site: Left Eye   NDC: L6038910, Lot: [id number], Expiration date: 11/26/2024, Waste: 0 mL   Post-op Post injection exam found visual acuity of at least counting fingers. The patient tolerated the procedure well. There were no complications. The patient received written and verbal post procedure care education. Post injection medications were not given.            ASSESSMENT/PLAN:    ICD-10-CM   1. Proliferative diabetic retinopathy of both eyes with macular edema associated with type 2 diabetes mellitus (HCC)  E11.3513 OCT, Retina - OU - Both Eyes    Intravitreal Injection, Pharmacologic Agent - OD - Right Eye    Intravitreal Injection, Pharmacologic Agent - OS - Left Eye  aflibercept (EYLEA) SOLN 2 mg    aflibercept (EYLEA) SOLN 2 mg    2. Long term (current) use of oral hypoglycemic drugs  Z79.84     3. Long-term (current) use of injectable non-insulin antidiabetic drugs  Z79.85     4. Current use of insulin (HCC)  Z79.4     5. Essential hypertension  I10     6. Hypertensive retinopathy of both eyes  H35.033     7. Combined forms of age-related cataract of both eyes  H25.813      1-4. Proliferative diabetic retinopathy OU - previously managed by Dr. Cathey Endow -- lost to f/u since 2020 - s/p PRP OU and IVA OS x7 (BB) - s/p IVA OD #1 (01.02.24), #2 (02.02.24), #3 (03.01.24), #4(04.05.24), #5 (10.21.24) -- IVA resistance - s/p IVA OS #1 (01.04.24), #2 (02.02.24), #3 (03.01.24), #4(04.05.24), #5 (05.03.24), #6 (06.03.24), #7 (07.01.24), #8 (08.05.24), #9 (09.09.24) - IVA  resistance ============================================================= - s/p IVE OD #1 (05.03.24), #2 (06.03.24), #3 (07.01.24), #4 (08.05.24), #5 (09.09.24), - s/p IVE OS #1 (10.21.24) - s/p PRP fill in OS (01.23.24) - exam shows scattered MA OU; laser PRP OU - FA (01.02.23) shows OD: Cluster of leakage temporal macula, no NV; OS: Scattered focal NVE greatest inferior and temporal quads -- will need PRP fill in OS -- completed Jan 2024 - BCVA 20/30 OD, 20/20 OS - OCT shows OD: Persistent cystic changes centrally -- slightly increased, OS: Trace persistent cystic changes temporal macula -- slightly increased at 6 weeks - recommend IVE OU (OD #6 and OS #2) today, 12.02.24 w/ f/u in 6 wks - pt wishes to proceed with injections - RBA of procedure discussed, questions answered - see procedure note - IVE informed consent obtained and signed, 05.03.24 (OU) - IVA informed consent obtained and signed, 01.02.24 (OU) - f/u 6 weeks -- DFE, OCT, possible injxns  5,6. Hypertensive retinopathy OU - discussed importance of tight BP control  - monitor  7. Mixed Cataract OU - The symptoms of cataract, surgical options, and treatments and risks were discussed with patient. - discussed diagnosis and progression - monitor  Ophthalmic Meds Ordered this visit:  Meds ordered this encounter  Medications   aflibercept (EYLEA) SOLN 2 mg   aflibercept (EYLEA) SOLN 2 mg     Return in about 6 weeks (around 08/13/2023) for f/u PDR OU , DFE, OCT, Possible, IVE, OU.  There are no Patient Instructions on file for this visit.  This document serves as a record of services personally performed by Karie Chimera, MD, PhD. It was created on their behalf by Glee Arvin. Manson Passey, OA an ophthalmic technician. The creation of this record is the provider's dictation and/or activities during the visit.    Electronically signed by: Glee Arvin. Manson Passey, OA 07/02/23 11:46 PM  This document serves as a record of services  personally performed by Karie Chimera, MD, PhD. It was created on their behalf by Charlette Caffey, COT an ophthalmic technician. The creation of this record is the provider's dictation and/or activities during the visit.    Electronically signed by:  Charlette Caffey, COT  07/02/23 11:46 PM  Karie Chimera, M.D., Ph.D. Diseases & Surgery of the Retina and Vitreous [facility] 07/02/2023   I have reviewed the above documentation for accuracy and completeness, and I agree with the above. Karie Chimera, M.D., Ph.D. 07/02/23 11:48 PM   Abbreviations: M myopia (nearsighted); A astigmatism; H hyperopia (farsighted);  P presbyopia; Mrx spectacle prescription;  CTL contact lenses; OD right eye; OS left eye; OU both eyes  XT exotropia; ET esotropia; PEK punctate epithelial keratitis; PEE punctate epithelial erosions; DES dry eye syndrome; MGD meibomian gland dysfunction; ATs artificial tears; PFAT's preservative free artificial tears; NSC nuclear sclerotic cataract; PSC posterior subcapsular cataract; ERM epi-retinal membrane; PVD posterior vitreous detachment; RD retinal detachment; DM diabetes mellitus; DR diabetic retinopathy; NPDR non-proliferative diabetic retinopathy; PDR proliferative diabetic retinopathy; CSME clinically significant macular edema; DME diabetic macular edema; dbh dot blot hemorrhages; CWS cotton wool spot; POAG primary open angle glaucoma; C/D cup-to-disc ratio; HVF humphrey visual field; GVF goldmann visual field; OCT optical coherence tomography; IOP intraocular pressure; BRVO Branch retinal vein occlusion; CRVO central retinal vein occlusion; CRAO central retinal artery occlusion; BRAO branch retinal artery occlusion; RT retinal tear; SB scleral buckle; PPV pars plana vitrectomy; VH Vitreous hemorrhage; PRP panretinal laser photocoagulation; IVK intravitreal kenalog; VMT vitreomacular traction; MH Macular hole;  NVD neovascularization of the disc; NVE  neovascularization elsewhere; AREDS age related eye disease study; ARMD age related macular degeneration; POAG primary open angle glaucoma; EBMD epithelial/anterior basement membrane dystrophy; ACIOL anterior chamber intraocular lens; IOL intraocular lens; PCIOL posterior chamber intraocular lens; Phaco/IOL phacoemulsification with intraocular lens placement; PRK photorefractive keratectomy; LASIK laser assisted in situ keratomileusis; HTN hypertension; DM diabetes mellitus; COPD chronic obstructive pulmonary disease

## 2023-06-21 NOTE — Patient Instructions (Addendum)
I am going to give you samples of a new medication called IBSrella for your constipation.  Let me know in a week how you are doing and we can send in formal prescription if improved.  This medication works best if you take twice daily just before breakfast and dinner.  You can take MiraLAX on top of this as well.  Stop Amitiza (lubiprostone)  Follow-up in 4 to 6 weeks.  It was very nice seeing you again today.  I hope you have a great Thanksgiving.  Dr. Marletta Lor

## 2023-06-21 NOTE — Progress Notes (Signed)
Referring Provider: Kirstie Peri, MD Primary Care Physician:  Kirstie Peri, MD Primary GI:  Dr. Marletta Lor  Chief Complaint  Patient presents with   Follow-up    Pt following up on IBS-C. Pt states she is no better    HPI:   Jodi Ray is a 62 y.o. female with a history of anemia, brain aneurysm, CAD s/p stent in 2019, diabetes, chronic pain, HTN, hypothyroidism, OSA on CPAP, and CKD, adenomatous colon polyps due for surveillance in 2028, hemorrhoids, constipation, chronic upper abdominal pain on daily PPI, presenting today for follow-up of constipation, abdominal pain, and nausea.   IBS with constipation: Previously trialed Linzess, Trulance, Motegrity (too expensive) now on Amitiza 24 mcg twice daily, MiraLAX every morning, Metamucil twice daily.  Continues to have bowel movements every 5 days or so.  States these are hard, difficult to pass with straining.  Improved compared to baseline around 10 though still having issues with associated abdominal pain as well.  LLQ abdominal pain chronic, mild to moderate, intermittent.  Relieved with bowel movements.  Continues to have chronic nausea after meals.  History of type 1 diabetes for 50 years.  See further workup below.  Prior GI evaluation: HIDA scan 04/05/2023 unremarkable  Gastric imaging study 03/29/2023 without delayed gastric emptying.  EGD 03/19/2023 with gastritis, biopsies negative for H. Pylori.  CT abdomen pelvis with IV contrast 12/29/2022 unremarkable.  Colonoscopy  04/20/2022 with nonbleeding internal hemorrhoids, 3 mm cecal polyp removed, otherwise normal exam. Recommended 5-year surveillance due to family history of colon cancer. Pathology with tubular adenoma.   EGD April 2018 with moderate gastritis and benign gastric polyp. Also with normal duodenal biopsies.    Colonoscopy in 2018 with evidence of internal hemorrhoids, redundant left colon, and negative random colon biopsies for microscopic colitis.    HIDA  with normal gallbladder EF in 2018, but patient reported pain after Ensure ingestion. Initially saw surgeon, Dr. Marcha Solders, about possible cholecystectomy and scheduled surgery, but later cancelled as PCP did not think symptoms gallbladder related.     Abdominal ultrasound in 2018 with hepatic steatosis, mildly distended gallbladder, sonographic Murphy sign.   CT A/P with contrast in 2018 with fairly large amount of stool in the colon, small hiatal hernia, minimal ventral hernia containing fat only.  Past Medical History:  Diagnosis Date   Adult RDS (HCC)    Anemia    Brain aneurysm    frontal lobe   CAD (coronary artery disease)    a. 02/2018: s/p DES to Proximal LAD with residual 20% RCA stenosis.   Chronic pain    Diabetes mellitus without complication (HCC)    Headache    History of left bundle branch block (LBBB)    HTN (hypertension)    Hx of cardiovascular stress test 10/2016   intermediate risk study   Hypothyroidism    Neuromuscular disorder (HCC)    Neuropathy    Obesity    OSA on CPAP 03/10/2015   Renal insufficiency    Retinopathy    Sleep apnea    Varicose veins of both lower extremities     Past Surgical History:  Procedure Laterality Date   BIOPSY  11/14/2016   Procedure: BIOPSY;  Surgeon: West Bali, MD;  Location: [facility];  Service: Endoscopy;;  colon gastric duodenal   BIOPSY  03/19/2023   Procedure: BIOPSY;  Surgeon: Lanelle Bal, DO;  Location: [facility];  Service: Endoscopy;;   BREAST EXCISIONAL BIOPSY Right  BREAST SURGERY Left 1994   Lumpectomy   COLONOSCOPY WITH PROPOFOL N/A 11/14/2016   Dr. Darrick Penna: Moderately redundant rectosigmoid colon. Random colon biopsies benign. Internal hemorrhoids. Surveillance colonoscopy in 5 years.   COLONOSCOPY WITH PROPOFOL N/A 04/20/2022   Procedure: COLONOSCOPY WITH PROPOFOL;  Surgeon: Lanelle Bal, DO;  Location: [facility];  Service: Endoscopy;  Laterality: N/A;  12:30am   CORONARY  PRESSURE/FFR STUDY N/A 06/19/2023   Procedure: CORONARY PRESSURE/FFR STUDY;  Surgeon: Orbie Pyo, MD;  Location: [facility];  Service: Cardiovascular;  Laterality: N/A;   CORONARY STENT INTERVENTION N/A 03/22/2018   Procedure: CORONARY STENT INTERVENTION;  Surgeon: Kathleene Hazel, MD;  Location: [facility];  Service: Cardiovascular;  Laterality: N/A;   ESOPHAGOGASTRODUODENOSCOPY (EGD) WITH PROPOFOL N/A 11/14/2016   Dr. Darrick Penna: Esophagus normal. Moderate gastritis, few gastric polyps. Duodenal biopsies negative. Fundic gland polyp gastric polyp area no H pylori.   ESOPHAGOGASTRODUODENOSCOPY (EGD) WITH PROPOFOL N/A 03/19/2023   Procedure: ESOPHAGOGASTRODUODENOSCOPY (EGD) WITH PROPOFOL;  Surgeon: Lanelle Bal, DO;  Location: [facility];  Service: Endoscopy;  Laterality: N/A;  10:15 am, asa 3   FOOT SURGERY     LEFT HEART CATH AND CORONARY ANGIOGRAPHY N/A 03/22/2018   Procedure: LEFT HEART CATH AND CORONARY ANGIOGRAPHY;  Surgeon: Kathleene Hazel, MD;  Location: [facility];  Service: Cardiovascular;  Laterality: N/A;   LEFT HEART CATH AND CORONARY ANGIOGRAPHY N/A 06/19/2023   Procedure: LEFT HEART CATH AND CORONARY ANGIOGRAPHY;  Surgeon: Orbie Pyo, MD;  Location: [facility];  Service: Cardiovascular;  Laterality: N/A;   LEG SURGERY Right    POLYPECTOMY  04/20/2022   Procedure: POLYPECTOMY;  Surgeon: Lanelle Bal, DO;  Location: [facility];  Service: Endoscopy;;   REPLACEMENT TOTAL KNEE Right    SPINAL CORD STIMULATOR IMPLANT      Current Outpatient Medications  Medication Sig Dispense Refill   aspirin EC 81 MG tablet Take 1 tablet (81 mg total) by mouth daily. Swallow whole. 90 tablet 3   atorvastatin (LIPITOR) 80 MG tablet TAKE 1 TABLET BY MOUTH ONCE DAILY AT  6PM 90 tablet 2   calcitRIOL (ROCALTROL) 0.25 MCG capsule Take 0.25 mcg by mouth every Monday, Wednesday, and Friday.     Continuous Blood Gluc Sensor (DEXCOM G6  SENSOR) MISC 1 Device by Other route as directed.     desvenlafaxine (PRISTIQ) 25 MG 24 hr tablet Take 25 mg by mouth every evening.     EPINEPHRINE 0.3 mg/0.3 mL IJ SOAJ injection INJECT CONTENTS OF ONE PEN INTO THE MUSCLE AS NEEDED FOR ANAPHYLAXIS 2 each 0   furosemide (LASIX) 20 MG tablet Take 20-40 mg by mouth See admin instructions. Take 2 tablets in the morning and 1 tablet in the evening     hydrALAZINE (APRESOLINE) 25 MG tablet Take 1 tablet (25 mg total) by mouth 3 (three) times daily. 270 tablet 3   Insulin Human (INSULIN PUMP) SOLN Inject into the skin continuous.      Insulin Pen Needle (PEN NEEDLES 31GX5/16") 31G X 8 MM MISC      JARDIANCE 10 MG TABS tablet Take 5 mg by mouth every Monday, Wednesday, and Friday.     KERENDIA 10 MG TABS Take 10 mg by mouth every Monday, Wednesday, and Friday.     levothyroxine (SYNTHROID) 112 MCG tablet Take 112 mcg by mouth daily before breakfast.     lubiprostone (AMITIZA) 24 MCG capsule Take 1 capsule (24  mcg total) by mouth 2 (two) times daily with a meal. (Patient taking differently: Take 24 mcg by mouth 3 (three) times daily.) 60 capsule 3   metFORMIN (GLUCOPHAGE) 500 MG tablet Take 1 tablet (500 mg total) by mouth 2 (two) times daily with a meal.     nitroGLYCERIN (NITROSTAT) 0.4 MG SL tablet Place 1 tablet (0.4 mg total) under the tongue every 5 (five) minutes as needed for chest pain. 25 tablet 3   NOVOLOG 100 UNIT/ML injection Inject into the skin continuous. In insulin pump  0   Omega-3 Fatty Acids (FISH OIL) 1200 MG CAPS Take 1,200 mg by mouth daily.     pantoprazole (PROTONIX) 40 MG tablet Take 1 tablet (40 mg total) by mouth 2 (two) times daily before a meal. (Patient taking differently: Take 40 mg by mouth daily.) 60 tablet 3   verapamil (CALAN) 80 MG tablet Take 1 tablet (80 mg total) by mouth 3 (three) times daily. 90 tablet 5   No current facility-administered medications for this visit.    Allergies as of 06/21/2023 - Review  Complete 06/19/2023  Allergen Reaction Noted   Other Nausea And Vomiting and Other (See Comments) 08/04/2015   Fentanyl Nausea And Vomiting and Other (See Comments) 11/09/2016    Family History  Problem Relation Age of Onset   Cancer Mother 30       colon   Cancer Father        renal cell   Colon cancer Father    Breast cancer Sister    Cancer Sister        primary brain, and primary breast too   Breast cancer Sister    Cancer Sister        breast    Breast cancer Sister    Breast cancer Sister    Cancer Brother        kidney and liver   Sleep apnea Neg Hx     Social History   Socioeconomic History   Marital status: Widowed    Spouse name: Not on file   Number of children: 0   Years of education: Not on file   Highest education level: Bachelor's degree (e.g., BA, AB, BS)  Occupational History   Occupation: Retired  Tobacco Use   Smoking status: Never   Smokeless tobacco: Never  Vaping Use   Vaping status: Never Used  Substance and Sexual Activity   Alcohol use: No    Alcohol/week: 0.0 standard drinks of alcohol   Drug use: No   Sexual activity: Not Currently    Partners: Male  Other Topics Concern   Not on file  Social History Narrative   Drinks about 1 cup coffee day.   Cardiac rehab 3x week   One level home with significant other   Bachelors in Accounting      Right handed   No children   widow   Social Determinants of Health   Financial Resource Strain: Not on file  Food Insecurity: Not on file  Transportation Needs: Not on file  Physical Activity: Not on file  Stress: Not on file  Social Connections: Not on file    Subjective: Review of Systems  Constitutional:  Negative for chills and fever.  HENT:  Negative for congestion and hearing loss.   Eyes:  Negative for blurred vision and double vision.  Respiratory:  Negative for cough and shortness of breath.   Cardiovascular:  Negative for chest pain and palpitations.  Gastrointestinal:  Positive for abdominal pain, constipation and nausea. Negative for blood in stool, diarrhea, heartburn, melena and vomiting.  Genitourinary:  Negative for dysuria and urgency.  Musculoskeletal:  Negative for joint pain and myalgias.  Skin:  Negative for itching and rash.  Neurological:  Negative for dizziness and headaches.  Psychiatric/Behavioral:  Negative for depression. The patient is not nervous/anxious.      Objective: There were no vitals taken for this visit. Physical Exam Constitutional:      Appearance: Normal appearance.  HENT:     Head: Normocephalic and atraumatic.  Eyes:     Extraocular Movements: Extraocular movements intact.     Conjunctiva/sclera: Conjunctivae normal.  Cardiovascular:     Rate and Rhythm: Normal rate and regular rhythm.  Pulmonary:     Effort: Pulmonary effort is normal.     Breath sounds: Normal breath sounds.  Abdominal:     General: Bowel sounds are normal.     Palpations: Abdomen is soft.  Musculoskeletal:        General: No swelling. Normal range of motion.     Cervical back: Normal range of motion and neck supple.  Skin:    General: Skin is warm and dry.     Coloration: Skin is not jaundiced.  Neurological:     General: No focal deficit present.     Mental Status: She is alert and oriented to person, place, and time.  Psychiatric:        Mood and Affect: Mood normal.        Behavior: Behavior normal.      Assessment/Plan:  1.  Irritable bowel syndrome with constipation-somewhat improved on current regimen though still only have a bowel movement every 5 days.  Having associated abdominal pain and bloating as well.  Stop Amitiza.  Will give samples of Ibsrella.  Call with update next week and I can send in formal prescription if improved.  Continue MiraLAX 2 times daily on top of this.  2.  Nausea-chronic, see HPI for full workup.  Given her history of type 1 diabetes, she may have gastroparesis despite normal GES.  Consider trial  of Reglan pending on clinical course.  3.  Left lower quadrant abdominal pain-CT abdomen pelvis reassuring.  Likely due to irritable bowel syndrome.  Hopefully this improves as we get her bowels moving more effectively.  Follow-up in 4 to 6 weeks   06/21/2023 10:24 AM   Disclaimer: This note was dictated with voice recognition software. Similar sounding words can inadvertently be transcribed and may not be corrected upon review.

## 2023-06-22 ENCOUNTER — Ambulatory Visit (INDEPENDENT_AMBULATORY_CARE_PROVIDER_SITE_OTHER): Payer: Medicare Other

## 2023-06-22 DIAGNOSIS — Z299 Encounter for prophylactic measures, unspecified: Secondary | ICD-10-CM | POA: Diagnosis not present

## 2023-06-22 DIAGNOSIS — J309 Allergic rhinitis, unspecified: Secondary | ICD-10-CM

## 2023-06-22 DIAGNOSIS — L02413 Cutaneous abscess of right upper limb: Secondary | ICD-10-CM | POA: Diagnosis not present

## 2023-06-22 DIAGNOSIS — I10 Essential (primary) hypertension: Secondary | ICD-10-CM | POA: Diagnosis not present

## 2023-06-25 DIAGNOSIS — Z299 Encounter for prophylactic measures, unspecified: Secondary | ICD-10-CM | POA: Diagnosis not present

## 2023-06-25 DIAGNOSIS — I10 Essential (primary) hypertension: Secondary | ICD-10-CM | POA: Diagnosis not present

## 2023-06-25 DIAGNOSIS — N2581 Secondary hyperparathyroidism of renal origin: Secondary | ICD-10-CM | POA: Diagnosis not present

## 2023-06-25 DIAGNOSIS — L02413 Cutaneous abscess of right upper limb: Secondary | ICD-10-CM | POA: Diagnosis not present

## 2023-06-26 DIAGNOSIS — E785 Hyperlipidemia, unspecified: Secondary | ICD-10-CM | POA: Diagnosis not present

## 2023-06-26 DIAGNOSIS — Z794 Long term (current) use of insulin: Secondary | ICD-10-CM | POA: Diagnosis not present

## 2023-06-26 DIAGNOSIS — E039 Hypothyroidism, unspecified: Secondary | ICD-10-CM | POA: Diagnosis not present

## 2023-06-26 DIAGNOSIS — E118 Type 2 diabetes mellitus with unspecified complications: Secondary | ICD-10-CM | POA: Diagnosis not present

## 2023-06-26 DIAGNOSIS — N1832 Chronic kidney disease, stage 3b: Secondary | ICD-10-CM | POA: Diagnosis not present

## 2023-06-26 DIAGNOSIS — E669 Obesity, unspecified: Secondary | ICD-10-CM | POA: Diagnosis not present

## 2023-06-26 DIAGNOSIS — E11319 Type 2 diabetes mellitus with unspecified diabetic retinopathy without macular edema: Secondary | ICD-10-CM | POA: Diagnosis not present

## 2023-06-26 DIAGNOSIS — M1460 Charcot's joint, unspecified site: Secondary | ICD-10-CM | POA: Diagnosis not present

## 2023-06-26 DIAGNOSIS — E1140 Type 2 diabetes mellitus with diabetic neuropathy, unspecified: Secondary | ICD-10-CM | POA: Diagnosis not present

## 2023-06-26 DIAGNOSIS — E109 Type 1 diabetes mellitus without complications: Secondary | ICD-10-CM | POA: Diagnosis not present

## 2023-06-26 DIAGNOSIS — Z9641 Presence of insulin pump (external) (internal): Secondary | ICD-10-CM | POA: Diagnosis not present

## 2023-06-26 DIAGNOSIS — I10 Essential (primary) hypertension: Secondary | ICD-10-CM | POA: Diagnosis not present

## 2023-06-27 ENCOUNTER — Ambulatory Visit: Payer: Self-pay | Admitting: *Deleted

## 2023-06-27 ENCOUNTER — Ambulatory Visit: Payer: Medicare Other | Admitting: Nurse Practitioner

## 2023-06-27 ENCOUNTER — Ambulatory Visit: Payer: Medicare Other | Admitting: Gastroenterology

## 2023-06-27 DIAGNOSIS — E161 Other hypoglycemia: Secondary | ICD-10-CM | POA: Diagnosis not present

## 2023-06-27 DIAGNOSIS — R231 Pallor: Secondary | ICD-10-CM | POA: Diagnosis not present

## 2023-06-27 DIAGNOSIS — R55 Syncope and collapse: Secondary | ICD-10-CM | POA: Diagnosis not present

## 2023-07-02 ENCOUNTER — Encounter (INDEPENDENT_AMBULATORY_CARE_PROVIDER_SITE_OTHER): Payer: Self-pay | Admitting: Ophthalmology

## 2023-07-02 ENCOUNTER — Ambulatory Visit (INDEPENDENT_AMBULATORY_CARE_PROVIDER_SITE_OTHER): Payer: Medicare Other | Admitting: Ophthalmology

## 2023-07-02 DIAGNOSIS — Z7985 Long-term (current) use of injectable non-insulin antidiabetic drugs: Secondary | ICD-10-CM

## 2023-07-02 DIAGNOSIS — Z794 Long term (current) use of insulin: Secondary | ICD-10-CM | POA: Diagnosis not present

## 2023-07-02 DIAGNOSIS — H25813 Combined forms of age-related cataract, bilateral: Secondary | ICD-10-CM

## 2023-07-02 DIAGNOSIS — E113513 Type 2 diabetes mellitus with proliferative diabetic retinopathy with macular edema, bilateral: Secondary | ICD-10-CM

## 2023-07-02 DIAGNOSIS — Z7984 Long term (current) use of oral hypoglycemic drugs: Secondary | ICD-10-CM

## 2023-07-02 DIAGNOSIS — I10 Essential (primary) hypertension: Secondary | ICD-10-CM

## 2023-07-02 DIAGNOSIS — H35033 Hypertensive retinopathy, bilateral: Secondary | ICD-10-CM | POA: Diagnosis not present

## 2023-07-02 MED ORDER — AFLIBERCEPT 2MG/0.05ML IZ SOLN FOR KALEIDOSCOPE
2.0000 mg | INTRAVITREAL | Status: AC | PRN
  Administered 2023-07-02: 2 mg via INTRAVITREAL

## 2023-07-04 ENCOUNTER — Ambulatory Visit (INDEPENDENT_AMBULATORY_CARE_PROVIDER_SITE_OTHER): Payer: Self-pay

## 2023-07-04 DIAGNOSIS — J309 Allergic rhinitis, unspecified: Secondary | ICD-10-CM

## 2023-07-05 NOTE — Telephone Encounter (Signed)
Hi Jodi Ray,    Below is Dr Queen Blossom response to you.   Please tell her to stop that medication and go back to the Amitiza.  Thank you    Please follow the above instructions from Dr Marletta Lor.     Thank you  Dena

## 2023-07-05 NOTE — Telephone Encounter (Signed)
Also please tell Dr Marletta Lor that the sample drug hasn't done anything to help my problem. In fact it is getting worse.   This came from the patient via Mychart as I was trying to scehdule her follow up appt with Dr. Marletta Lor.

## 2023-07-06 DIAGNOSIS — Z299 Encounter for prophylactic measures, unspecified: Secondary | ICD-10-CM | POA: Diagnosis not present

## 2023-07-06 DIAGNOSIS — I10 Essential (primary) hypertension: Secondary | ICD-10-CM | POA: Diagnosis not present

## 2023-07-06 DIAGNOSIS — E039 Hypothyroidism, unspecified: Secondary | ICD-10-CM | POA: Diagnosis not present

## 2023-07-06 DIAGNOSIS — Z0000 Encounter for general adult medical examination without abnormal findings: Secondary | ICD-10-CM | POA: Diagnosis not present

## 2023-07-06 DIAGNOSIS — Z7189 Other specified counseling: Secondary | ICD-10-CM | POA: Diagnosis not present

## 2023-07-06 DIAGNOSIS — R5383 Other fatigue: Secondary | ICD-10-CM | POA: Diagnosis not present

## 2023-07-06 DIAGNOSIS — Z1339 Encounter for screening examination for other mental health and behavioral disorders: Secondary | ICD-10-CM | POA: Diagnosis not present

## 2023-07-06 DIAGNOSIS — Z1331 Encounter for screening for depression: Secondary | ICD-10-CM | POA: Diagnosis not present

## 2023-07-06 DIAGNOSIS — I700 Atherosclerosis of aorta: Secondary | ICD-10-CM | POA: Diagnosis not present

## 2023-07-06 DIAGNOSIS — R52 Pain, unspecified: Secondary | ICD-10-CM | POA: Diagnosis not present

## 2023-07-09 DIAGNOSIS — Z79899 Other long term (current) drug therapy: Secondary | ICD-10-CM | POA: Diagnosis not present

## 2023-07-09 DIAGNOSIS — E7800 Pure hypercholesterolemia, unspecified: Secondary | ICD-10-CM | POA: Diagnosis not present

## 2023-07-09 DIAGNOSIS — E039 Hypothyroidism, unspecified: Secondary | ICD-10-CM | POA: Diagnosis not present

## 2023-07-09 DIAGNOSIS — R5383 Other fatigue: Secondary | ICD-10-CM | POA: Diagnosis not present

## 2023-07-09 DIAGNOSIS — E559 Vitamin D deficiency, unspecified: Secondary | ICD-10-CM | POA: Diagnosis not present

## 2023-07-10 DIAGNOSIS — E1151 Type 2 diabetes mellitus with diabetic peripheral angiopathy without gangrene: Secondary | ICD-10-CM | POA: Diagnosis not present

## 2023-07-10 DIAGNOSIS — E1140 Type 2 diabetes mellitus with diabetic neuropathy, unspecified: Secondary | ICD-10-CM | POA: Diagnosis not present

## 2023-07-12 DIAGNOSIS — Z1379 Encounter for other screening for genetic and chromosomal anomalies: Secondary | ICD-10-CM | POA: Diagnosis not present

## 2023-07-12 DIAGNOSIS — E109 Type 1 diabetes mellitus without complications: Secondary | ICD-10-CM | POA: Diagnosis not present

## 2023-07-12 DIAGNOSIS — G43909 Migraine, unspecified, not intractable, without status migrainosus: Secondary | ICD-10-CM | POA: Diagnosis not present

## 2023-07-12 DIAGNOSIS — E785 Hyperlipidemia, unspecified: Secondary | ICD-10-CM | POA: Diagnosis not present

## 2023-07-12 DIAGNOSIS — I10 Essential (primary) hypertension: Secondary | ICD-10-CM | POA: Diagnosis not present

## 2023-07-12 DIAGNOSIS — R42 Dizziness and giddiness: Secondary | ICD-10-CM | POA: Diagnosis not present

## 2023-07-12 DIAGNOSIS — F411 Generalized anxiety disorder: Secondary | ICD-10-CM | POA: Diagnosis not present

## 2023-07-12 DIAGNOSIS — Z820 Family history of epilepsy and other diseases of the nervous system: Secondary | ICD-10-CM | POA: Diagnosis not present

## 2023-07-12 DIAGNOSIS — E039 Hypothyroidism, unspecified: Secondary | ICD-10-CM | POA: Diagnosis not present

## 2023-07-12 DIAGNOSIS — M6281 Muscle weakness (generalized): Secondary | ICD-10-CM | POA: Diagnosis not present

## 2023-07-12 DIAGNOSIS — R5383 Other fatigue: Secondary | ICD-10-CM | POA: Diagnosis not present

## 2023-07-12 DIAGNOSIS — F339 Major depressive disorder, recurrent, unspecified: Secondary | ICD-10-CM | POA: Diagnosis not present

## 2023-07-13 ENCOUNTER — Ambulatory Visit (INDEPENDENT_AMBULATORY_CARE_PROVIDER_SITE_OTHER): Payer: Medicare Other | Admitting: Otolaryngology

## 2023-07-13 ENCOUNTER — Ambulatory Visit (INDEPENDENT_AMBULATORY_CARE_PROVIDER_SITE_OTHER): Payer: Self-pay

## 2023-07-13 ENCOUNTER — Encounter (INDEPENDENT_AMBULATORY_CARE_PROVIDER_SITE_OTHER): Payer: Self-pay

## 2023-07-13 VITALS — Ht 68.0 in | Wt 260.0 lb

## 2023-07-13 DIAGNOSIS — H6123 Impacted cerumen, bilateral: Secondary | ICD-10-CM

## 2023-07-13 DIAGNOSIS — H608X3 Other otitis externa, bilateral: Secondary | ICD-10-CM | POA: Diagnosis not present

## 2023-07-13 DIAGNOSIS — J309 Allergic rhinitis, unspecified: Secondary | ICD-10-CM | POA: Diagnosis not present

## 2023-07-15 DIAGNOSIS — H608X3 Other otitis externa, bilateral: Secondary | ICD-10-CM | POA: Insufficient documentation

## 2023-07-15 DIAGNOSIS — H6123 Impacted cerumen, bilateral: Secondary | ICD-10-CM | POA: Insufficient documentation

## 2023-07-15 NOTE — Progress Notes (Signed)
Patient ID: Jodi Ray, female   DOB: 08/02/1960, 62 y.o.   MRN: [id number]  CC: Clogging and itchy sensation in ears  HPI: The patient is a 62 year old female who returns today complaining of clogging and itchy sensation in her ears.  The patient was previously seen for chronic eczematous otitis externa, recurrent dizziness, hearing loss, and recurrent cerumen impaction.  According to the patient, she continues to have frequent itchy sensation in her ears.  She has also noted increasing clogging sensation in both ears.  She denies any significant otalgia, otorrhea, or vertigo.  She denies any recent change in her hearing.  Exam: General: Communicates without difficulty, well nourished, no acute distress. Head: Normocephalic, no evidence injury, no tenderness, facial buttresses intact without stepoff. Face/sinus: No tenderness to palpation and percussion. Facial movement is normal and symmetric. Eyes: PERRL, EOMI. No scleral icterus, conjunctivae clear. Neuro: CN II exam reveals vision grossly intact.  No nystagmus at any point of gaze. Ears: Auricles well formed without lesions.  Bilateral cerumen impaction.  Eczematous changes are noted within the ear canals.  Nose: External evaluation reveals normal support and skin without lesions.  Dorsum is intact.  Anterior rhinoscopy reveals congested mucosa over anterior aspect of inferior turbinates and intact septum.  No purulence noted. Oral:  Oral cavity and oropharynx are intact, symmetric, without erythema or edema.  Mucosa is moist without lesions. Neck: Full range of motion without pain.  There is no significant lymphadenopathy.  No masses palpable.  Thyroid bed within normal limits to palpation.  Parotid glands and submandibular glands equal bilaterally without mass.  Trachea is midline. Neuro:  CN 2-12 grossly intact.   Procedure: Bilateral cerumen disimpaction Anesthesia: None Description: Under the operating microscope, the cerumen is carefully  removed with a combination of cerumen currette, alligator forceps, and suction catheters.  After the cerumen is removed, the TMs are noted to be normal.  Eczematous changes are noted in both ear canals.  No mass, erythema, or lesions. The patient tolerated the procedure well.   Assessment: 1.  Bilateral cerumen impaction. 2.  After the cerumen removal procedure, both tympanic membranes and middle ear spaces are noted to be normal.  Eczematous changes are noted within the ear canals. 3.  Bilateral chronic eczematous otitis externa.  Plan: 1.  Otomicroscopy with bilateral cerumen disimpaction. 2.  The physical exam findings are reviewed with the patient. 3.  Elocon cream to treat the chronic eczematous otitis externa. 4.  The patient will return for reevaluation in 4 months.

## 2023-07-20 ENCOUNTER — Ambulatory Visit (INDEPENDENT_AMBULATORY_CARE_PROVIDER_SITE_OTHER): Payer: Medicare Other

## 2023-07-20 DIAGNOSIS — J309 Allergic rhinitis, unspecified: Secondary | ICD-10-CM

## 2023-07-23 ENCOUNTER — Encounter: Payer: Self-pay | Admitting: Nurse Practitioner

## 2023-07-23 ENCOUNTER — Ambulatory Visit: Payer: Medicare Other | Attending: Nurse Practitioner | Admitting: Nurse Practitioner

## 2023-07-23 VITALS — BP 132/64 | HR 78 | Ht 68.0 in | Wt 267.6 lb

## 2023-07-23 DIAGNOSIS — I5032 Chronic diastolic (congestive) heart failure: Secondary | ICD-10-CM | POA: Diagnosis not present

## 2023-07-23 DIAGNOSIS — E785 Hyperlipidemia, unspecified: Secondary | ICD-10-CM | POA: Insufficient documentation

## 2023-07-23 DIAGNOSIS — I2481 Acute coronary microvascular dysfunction: Secondary | ICD-10-CM | POA: Diagnosis not present

## 2023-07-23 DIAGNOSIS — E108 Type 1 diabetes mellitus with unspecified complications: Secondary | ICD-10-CM | POA: Diagnosis not present

## 2023-07-23 DIAGNOSIS — I25119 Atherosclerotic heart disease of native coronary artery with unspecified angina pectoris: Secondary | ICD-10-CM | POA: Diagnosis not present

## 2023-07-23 DIAGNOSIS — I10 Essential (primary) hypertension: Secondary | ICD-10-CM | POA: Diagnosis not present

## 2023-07-23 DIAGNOSIS — N183 Chronic kidney disease, stage 3 unspecified: Secondary | ICD-10-CM | POA: Insufficient documentation

## 2023-07-23 DIAGNOSIS — R0609 Other forms of dyspnea: Secondary | ICD-10-CM | POA: Diagnosis not present

## 2023-07-23 MED ORDER — CARVEDILOL 6.25 MG PO TABS: 6.2500 mg | ORAL_TABLET | Freq: Two times a day (BID) | ORAL | 0 refills | Status: DC

## 2023-07-23 NOTE — Patient Instructions (Addendum)
Medication Instructions:  Your physician has recommended you make the following change in your medication:  Please Increase Carvedilol to 6.25 Mg Twice daily   Labwork: None   Testing/Procedures: None   Follow-Up: Your physician recommends that you schedule a follow-up appointment in: 3-4 weeks   Any Other Special Instructions Will Be Listed Below (If Applicable).  If you need a refill on your cardiac medications before your next appointment, please call your pharmacy.

## 2023-07-23 NOTE — Progress Notes (Signed)
Cardiology Office Note:  .   Date:  07/23/2023  ID:  ADRAIN Ray, DOB September 09, 1960, MRN [id number] PCP: Kirstie Peri, MD  [facility] Providers Cardiologist:  Dina Rich, MD    History of Present Illness: Jodi Ray is a 62 y.o. female with a PMH of CAD, HTN, HLD, chronic diastolic CHF, type 1 diabetes, OSA on CPAP, CKD stage 3, chronic LBBB, dizziness, and palpitations, who presents today for 3-4 week follow-up.   Last seen by Dr. Dina Rich on June 13, 2023.  Patient had noted ongoing chest pains, there was some progression in frequency of her pain as progressing DOE, stress test 1 year before was benign.  She noted that her chest pain was similar to when she had a stent several years prior.  Cardiac catheterization was arranged.  Underwent left heart cath on June 19, 2023 that revealed patent proximal LAD stent with mild in-stent restenosis and mild disease of RCA, evidence of coronary microvascular dysfunction with CFR of less than 2.  It was recommended that given evidence of coronary microvascular dysfunction, would avoid nitrate containing compounds and consider ACE inhibitors, ARB's, statins, CCB, BB, ranolazine and ivabradine.  Today she presents for 3 to 4-week follow-up postcardiac catheterization.  She states she has not been doing well recently.  She continues to admit to chest pain as well as dyspnea on exertion that appears to be worsening.  Says while coming in from her car to the office, she noted shortness of breath.  She is wanting to know if she can restart cardiac rehab, as she did well with this in the past.  Says her PCP recently put her back on carvedilol, denies any low BPs.  Had a recent episode with low blood sugar that caused a passing out episode.  Denies any recurrence in this. Denies any palpitations, presyncope, dizziness, orthopnea, PND, significant weight changes, acute bleeding, or claudication. Admits to stable lower extremity  edema.   ROS: Negative.  See HPI. 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   Studies Reviewed: Marland Kitchen    EKG:  EKG Interpretation Date/Time:  Monday July 23 2023 13:24:47 EST Ventricular Rate:  79 PR Interval:  168 QRS Duration:  126 QT Interval:  414 QTC Calculation: 474 R Axis:   62  Text Interpretation: Normal sinus rhythm Non-specific intra-ventricular conduction block T wave abnormality, consider inferolateral ischemia When compared with ECG of 19-Jun-2023 12:07, Sinus rhythm has replaced Electronic ventricular pacemaker Confirmed by Sharlene Dory [phone]) on 07/23/2023 1:27:44 PM   LHC 06/2023:    Prox LAD lesion is 10% stenosed.   1.  Occluded right radial necessitating right ulnar approach. 2.  Patent proximal LAD stent with mild in-stent restenosis and mild disease of the right coronary artery. 3.  LVEDP of 18 mmHg. 4.  Evidence of coronary microvascular dysfunction with a CFR of less than 2.  The index of microvascular resistance is negative at 20.   Recommendation: Given evidence of coronary microvascular dysfunction would avoid nitrate containing compounds and consider the  ACE inhibitors, ARBs, statins, calcium channel blockers, beta-blockers, ranolazine, and ivadrabine.  Cardiac monitor 02/2022:    14 day monitor   Rare supraventricular ectopy in the form of isolated PACs, couplets, triplets. 6 runs of SVT longest 10 beats.   Rare ventricular ectopy in the form of isolated PVCs, couplets   Reported symptoms correlated with sinus rhythm, rare PACs and PVCs     Patch Wear Time:  14 days and  0 hours (2023-07-13T10:14:59-0400 to 2023-07-27T10:15:09-398)   Patient had a min HR of 67 bpm, max HR of 171 bpm, and avg HR of 86 bpm. Predominant underlying rhythm was Sinus Rhythm. Bundle Branch Block/IVCD was present. 6 Supraventricular Tachycardia runs occurred, the run with the fastest interval lasting 9 beats  with a max rate of 171 bpm, the longest lasting 10 beats with an avg rate  of 112 bpm. Isolated SVEs were rare (<1.0%), SVE Couplets were rare (<1.0%), and SVE Triplets were rare (<1.0%). Isolated VEs were rare (<1.0%), VE Couplets were rare (<1.0%), and  no VE Triplets were present. Ventricular Bigeminy and Trigeminy were present.  Lexiscan 01/2022:    The study is normal. The study is low risk.   No ST deviation was noted.   LV perfusion is normal. There is no evidence of ischemia. There is no evidence of infarction.   Left ventricular function is normal.EF 75%  End diastolic cavity size is normal. End systolic cavity size is normal.  Echo 10/2020:  1. Left ventricular ejection fraction, by estimation, is 60 to 65%. The  left ventricle has normal function. The left ventricle has no regional  wall motion abnormalities. Left ventricular diastolic parameters are  consistent with Grade I diastolic  dysfunction (impaired relaxation).   2. Right ventricular systolic function is normal. The right ventricular  size is normal.   3. Left atrial size was severely dilated.   4. Right atrial size was mildly dilated.   5. The mitral valve is normal in structure. No evidence of mitral valve  regurgitation. No evidence of mitral stenosis.   6. The aortic valve is tricuspid. There is mild calcification of the  aortic valve. There is mild thickening of the aortic valve. Aortic valve  regurgitation is not visualized. No aortic stenosis is present.   7. The inferior vena cava is normal in size with greater than 50%  respiratory variability, suggesting right atrial pressure of 3 mmHg.  Physical Exam:   VS:  BP 132/64   Pulse 78   Ht 5\' 8"  (1.727 m)   Wt 267 lb 9.6 oz (121.4 kg)   SpO2 97%   BMI 40.69 kg/m    Wt Readings from Last 3 Encounters:  07/23/23 267 lb 9.6 oz (121.4 kg)  07/13/23 260 lb (117.9 kg)  06/21/23 268 lb 9.6 oz (121.8 kg)    GEN: Morbidly obese, 62 year old female in no acute distress NECK: No JVD; No carotid bruits CARDIAC: S1/S2, RRR, no murmurs,  rubs, gallops RESPIRATORY:  Clear to auscultation without rales, wheezing or rhonchi  ABDOMEN: Soft, non-tender, non-distended EXTREMITIES:  Edema to BLE (wearing compression stockings); No deformity   ASSESSMENT AND PLAN: .    Coronary microvascular dysfunction, CAD, DOE Continues to admit to progressing angina-see recent left heart cath noted above.  There is evidence of coronary microvascular dysfunction noted on heart cath.  Was recommended to avoid nitrate containing compounds and consider ACE inhibitors, ARB's, statins, calcium channel blockers, beta-blockers, Ranexa, and ivabradine.  PCP recently put her back on low-dose carvedilol.  Will increase carvedilol to 6.25 mg twice daily.  She will continue to monitor and log her BP at home and let us know in 1 week if this helps improve her CP.  EKG was obtained today due to CP and normal sinus rhythm with LBBB and T wave abnormality as noted on previous EKG.  No acute ischemic changes noted on EKG.  Continue rest of medication regimen.  If medication does  not improve her symptoms, consider Ranexa as next agent-no contraindications noted to starting this medicine.  Care and ED precautions discussed.  Will route note to Dr. Wyline Mood to see if patient is a good candidate for cardiac rehab.  HTN BP stable today.  She presents to me past BP log that shows SBP averaging from upper 130s to upper 150s.  SBP goal is less than 130.  Will increase carvedilol to 6.25 mg twice daily as mentioned above.  Continue rest of medication regimen. Heart healthy diet and regular cardiovascular exercise encouraged.   HLD Most recent LDL was reviewed and was at goal.  Continue current medication regimen. Heart healthy diet and regular cardiovascular exercise encouraged.   Chronic diastolic CHF TTE in 2022 revealed EF 60 to 65%, grade 1 DD. Euvolemic and well compensated on exam.  Notes leg edema and is currently wearing compression stockings bilaterally.  No red flag signs  or symptoms noted.  Increasing carvedilol as mentioned above.  Continue rest of medication regimen. Low sodium diet, fluid restriction <2L, and daily weights encouraged. Educated to contact our office for weight gain of 2 lbs overnight or 5 lbs in one week.  CKD stage 3 Most recent lab work appears stable.  Avoid nephrotoxic agents.  Increasing carvedilol as mentioned above.  No medication changes at this time.  Continue follow-up with PCP and nephrology.  6.  Type 1 diabetes Patient had recent episode of low blood sugar that led to passing out episode.  Denies any current red flag signs or symptoms.  Her care is being managed by an endocrinologist.  Continue follow-up with endocrinology. Care and ED precautions discussed.  Diabetic, heart healthy diet recommended.   Dispo: Follow-up with me/[facility] in 3 to 4 weeks or sooner if anything changes.  Signed, Sharlene Dory, NP

## 2023-07-31 ENCOUNTER — Other Ambulatory Visit (HOSPITAL_COMMUNITY)
Admission: RE | Admit: 2023-07-31 | Discharge: 2023-07-31 | Disposition: A | Discharge status: Home / Self Care | Payer: Medicare Other | Source: Ambulatory Visit | Attending: Nephrology | Admitting: Nephrology

## 2023-07-31 DIAGNOSIS — N2581 Secondary hyperparathyroidism of renal origin: Secondary | ICD-10-CM | POA: Insufficient documentation

## 2023-07-31 DIAGNOSIS — D631 Anemia in chronic kidney disease: Secondary | ICD-10-CM | POA: Insufficient documentation

## 2023-07-31 DIAGNOSIS — N189 Chronic kidney disease, unspecified: Secondary | ICD-10-CM | POA: Diagnosis not present

## 2023-07-31 DIAGNOSIS — R809 Proteinuria, unspecified: Secondary | ICD-10-CM | POA: Diagnosis not present

## 2023-07-31 DIAGNOSIS — E1122 Type 2 diabetes mellitus with diabetic chronic kidney disease: Secondary | ICD-10-CM | POA: Insufficient documentation

## 2023-07-31 LAB — RENAL FUNCTION PANEL
Albumin: 3.7 g/dL (ref 3.5–5.0)
Anion gap: 7 (ref 5–15)
BUN: 25 mg/dL — ABNORMAL HIGH (ref 8–23)
CO2: 28 mmol/L (ref 22–32)
Calcium: 9.5 mg/dL (ref 8.9–10.3)
Chloride: 104 mmol/L (ref 98–111)
Creatinine, Ser: 1.29 mg/dL — ABNORMAL HIGH (ref 0.44–1.00)
GFR, Estimated: 46 mL/min — ABNORMAL LOW (ref >60)
Glucose, Bld: 71 mg/dL (ref 70–99)
Phosphorus: 3.5 mg/dL (ref 2.5–4.6)
Potassium: 4.9 mmol/L (ref 3.5–5.1)
Sodium: 139 mmol/L (ref 135–145)

## 2023-07-31 LAB — CBC
HCT: 36.7 % (ref 36.0–46.0)
Hemoglobin: 11.6 g/dL — ABNORMAL LOW (ref 12.0–15.0)
MCH: 27.6 pg (ref 26.0–34.0)
MCHC: 31.6 g/dL (ref 30.0–36.0)
MCV: 87.4 fL (ref 80.0–100.0)
Platelets: 263 10*3/uL (ref 150–400)
RBC: 4.2 MIL/uL (ref 3.87–5.11)
RDW: 16.3 % — ABNORMAL HIGH (ref 11.5–15.5)
WBC: 5.8 10*3/uL (ref 4.0–10.5)
nRBC: 0 % (ref 0.0–0.2)

## 2023-07-31 LAB — PROTEIN / CREATININE RATIO, URINE
Creatinine, Urine: 169 mg/dL
Protein Creatinine Ratio: 0.11 mg/mg{creat} (ref 0.00–0.15)
Total Protein, Urine: 18 mg/dL

## 2023-08-02 DIAGNOSIS — N2581 Secondary hyperparathyroidism of renal origin: Secondary | ICD-10-CM | POA: Diagnosis not present

## 2023-08-02 DIAGNOSIS — F410 Panic disorder [episodic paroxysmal anxiety] without agoraphobia: Secondary | ICD-10-CM | POA: Diagnosis not present

## 2023-08-02 DIAGNOSIS — R52 Pain, unspecified: Secondary | ICD-10-CM | POA: Diagnosis not present

## 2023-08-02 DIAGNOSIS — Z299 Encounter for prophylactic measures, unspecified: Secondary | ICD-10-CM | POA: Diagnosis not present

## 2023-08-02 DIAGNOSIS — E109 Type 1 diabetes mellitus without complications: Secondary | ICD-10-CM | POA: Diagnosis not present

## 2023-08-02 DIAGNOSIS — I10 Essential (primary) hypertension: Secondary | ICD-10-CM | POA: Diagnosis not present

## 2023-08-02 LAB — PARATHYROID HORMONE, INTACT (NO CA): PTH: 71 pg/mL — ABNORMAL HIGH (ref 15–65)

## 2023-08-03 ENCOUNTER — Ambulatory Visit (INDEPENDENT_AMBULATORY_CARE_PROVIDER_SITE_OTHER): Payer: Self-pay

## 2023-08-03 DIAGNOSIS — J309 Allergic rhinitis, unspecified: Secondary | ICD-10-CM | POA: Diagnosis not present

## 2023-08-06 DIAGNOSIS — N2581 Secondary hyperparathyroidism of renal origin: Secondary | ICD-10-CM | POA: Diagnosis not present

## 2023-08-06 DIAGNOSIS — I129 Hypertensive chronic kidney disease with stage 1 through stage 4 chronic kidney disease, or unspecified chronic kidney disease: Secondary | ICD-10-CM | POA: Diagnosis not present

## 2023-08-06 DIAGNOSIS — E1122 Type 2 diabetes mellitus with diabetic chronic kidney disease: Secondary | ICD-10-CM | POA: Diagnosis not present

## 2023-08-06 DIAGNOSIS — I5032 Chronic diastolic (congestive) heart failure: Secondary | ICD-10-CM | POA: Diagnosis not present

## 2023-08-08 ENCOUNTER — Encounter: Payer: Self-pay | Admitting: Internal Medicine

## 2023-08-08 ENCOUNTER — Ambulatory Visit (INDEPENDENT_AMBULATORY_CARE_PROVIDER_SITE_OTHER): Payer: Medicare Other

## 2023-08-08 ENCOUNTER — Ambulatory Visit: Payer: Medicare Other | Admitting: Internal Medicine

## 2023-08-08 ENCOUNTER — Ambulatory Visit: Payer: Medicare Other | Admitting: Nurse Practitioner

## 2023-08-08 VITALS — BP 114/60 | HR 97 | Temp 97.8°F | Ht 68.0 in | Wt 272.7 lb

## 2023-08-08 DIAGNOSIS — R1032 Left lower quadrant pain: Secondary | ICD-10-CM

## 2023-08-08 DIAGNOSIS — J309 Allergic rhinitis, unspecified: Secondary | ICD-10-CM | POA: Diagnosis not present

## 2023-08-08 DIAGNOSIS — K5900 Constipation, unspecified: Secondary | ICD-10-CM

## 2023-08-08 DIAGNOSIS — K581 Irritable bowel syndrome with constipation: Secondary | ICD-10-CM | POA: Diagnosis not present

## 2023-08-08 DIAGNOSIS — R1084 Generalized abdominal pain: Secondary | ICD-10-CM

## 2023-08-08 DIAGNOSIS — R110 Nausea: Secondary | ICD-10-CM | POA: Diagnosis not present

## 2023-08-08 MED ORDER — LACTULOSE 20 GM/30ML PO SOLN: 15.0000 mL | Freq: Two times a day (BID) | ORAL | 3 refills | Status: AC

## 2023-08-08 MED ORDER — LUBIPROSTONE 24 MCG PO CAPS
24.0000 ug | ORAL_CAPSULE | Freq: Two times a day (BID) | ORAL | 11 refills | Status: AC
Start: 2023-08-08 — End: 2024-08-07

## 2023-08-08 NOTE — Patient Instructions (Signed)
 I am going to refer you to anorectal manometry in [facility] to further evaluate your IBS with constipation.  I want you to stop the IBSrella and go back on Amitiza  twice daily.  I sent a new prescription to pharmacy.  You can also add on lactulose  1-2 times daily 15 mL.  He can titrate this medication up and how you respond.  I would watch your blood sugar closely while on this medication.  Follow-up in 2 to 3 months.  It was very nice seeing you again today.  Dr. Cindie

## 2023-08-08 NOTE — Progress Notes (Signed)
 Referring Provider: Maree Isles, MD Primary Care Physician:  Maree Isles, MD Primary GI:  Dr. Cindie  Chief Complaint  Patient presents with   Follow-up    Patient here today for a six week follow up and she is still having issues with IBS C. She is currently taking benefiber bid and metamucil at bedtime, was given samples of Ibsrela and this has not helped. She says she has one bm per week, and has to strain.     HPI:   Jodi Ray is a 63 y.o. female with a history of anemia, brain aneurysm, CAD s/p stent in 2019, diabetes, chronic pain, HTN, hypothyroidism, OSA on CPAP, and CKD, adenomatous colon polyps due for surveillance in 2028, hemorrhoids, constipation, chronic upper abdominal pain on daily PPI, presenting today for follow-up of constipation, abdominal pain, and nausea.   IBS with constipation: Previously trialed Linzess , Trulance, Motegrity  (too expensive) Amitiza , MiraLAX every morning, Metamucil twice daily.  Was given samples of IBSrella on previous visit which she states made her bowel movements worse.  Continues to have bowel movements every 5 days or so.   Also with LLQ abdominal pain chronic, mild to moderate, intermittent.  Relieved with bowel movements.  Continues to have chronic nausea after meals.  History of type 1 diabetes for 50 years.  See further workup below.  Prior GI evaluation: HIDA scan 04/05/2023 unremarkable  Gastric imaging study 03/29/2023 without delayed gastric emptying.  EGD 03/19/2023 with gastritis, biopsies negative for H. Pylori.  CT abdomen pelvis with IV contrast 12/29/2022 unremarkable.  Colonoscopy  04/20/2022 with nonbleeding internal hemorrhoids, 3 mm cecal polyp removed, otherwise normal exam. Recommended 5-year surveillance due to family history of colon cancer. Pathology with tubular adenoma.   EGD April 2018 with moderate gastritis and benign gastric polyp. Also with normal duodenal biopsies.    Colonoscopy in 2018 with  evidence of internal hemorrhoids, redundant left colon, and negative random colon biopsies for microscopic colitis.    HIDA with normal gallbladder EF in 2018, but patient reported pain after Ensure ingestion. Initially saw surgeon, Dr. Maranda, about possible cholecystectomy and scheduled surgery, but later cancelled as PCP did not think symptoms gallbladder related.     Abdominal ultrasound in 2018 with hepatic steatosis, mildly distended gallbladder, sonographic Murphy sign.   CT A/P with contrast in 2018 with fairly large amount of stool in the colon, small hiatal hernia, minimal ventral hernia containing fat only.  Past Medical History:  Diagnosis Date   Adult RDS (HCC)    Anemia    Brain aneurysm    frontal lobe   CAD (coronary artery disease)    a. 02/2018: s/p DES to Proximal LAD with residual 20% RCA stenosis.   Chronic pain    Diabetes mellitus without complication (HCC)    Headache    History of left bundle branch block (LBBB)    HTN (hypertension)    Hx of cardiovascular stress test 10/2016   intermediate risk study   Hypothyroidism    Neuromuscular disorder (HCC)    Neuropathy    Obesity    OSA on CPAP 03/10/2015   Renal insufficiency    Retinopathy    Sleep apnea    Varicose veins of both lower extremities     Past Surgical History:  Procedure Laterality Date   BIOPSY  11/14/2016   Procedure: BIOPSY;  Surgeon: Margo LITTIE Haddock, MD;  Location: [facility];  Service: Endoscopy;;  colon gastric duodenal   BIOPSY  03/19/2023   Procedure: BIOPSY;  Surgeon: Cindie Carlin POUR, DO;  [facility]: [facility];  Service: Endoscopy;;   BREAST EXCISIONAL BIOPSY Right    BREAST SURGERY Left 1994   Lumpectomy   COLONOSCOPY WITH PROPOFOL  N/A 11/14/2016   Dr. harvey: Moderately redundant rectosigmoid colon. Random colon biopsies benign. Internal hemorrhoids. Surveillance colonoscopy in 5 years.   COLONOSCOPY WITH PROPOFOL  N/A 04/20/2022   Procedure: COLONOSCOPY WITH PROPOFOL ;   Surgeon: Cindie Carlin POUR, DO;  [facility]: [facility];  Service: Endoscopy;  Laterality: N/A;  12:30am   CORONARY PRESSURE/FFR STUDY N/A 06/19/2023   Procedure: CORONARY PRESSURE/FFR STUDY;  Surgeon: Wendel Lurena POUR, MD;  Location: [facility];  Service: Cardiovascular;  Laterality: N/A;   CORONARY STENT INTERVENTION N/A 03/22/2018   Procedure: CORONARY STENT INTERVENTION;  Surgeon: Verlin Lonni BIRCH, MD;  Location: [facility];  Service: Cardiovascular;  Laterality: N/A;   ESOPHAGOGASTRODUODENOSCOPY (EGD) WITH PROPOFOL  N/A 11/14/2016   Dr. harvey: Esophagus normal. Moderate gastritis, few gastric polyps. Duodenal biopsies negative. Fundic gland polyp gastric polyp area no H pylori.   ESOPHAGOGASTRODUODENOSCOPY (EGD) WITH PROPOFOL  N/A 03/19/2023   Procedure: ESOPHAGOGASTRODUODENOSCOPY (EGD) WITH PROPOFOL ;  Surgeon: Cindie Carlin POUR, DO;  [facility]: [facility];  Service: Endoscopy;  Laterality: N/A;  10:15 am, asa 3   FOOT SURGERY     LEFT HEART CATH AND CORONARY ANGIOGRAPHY N/A 03/22/2018   Procedure: LEFT HEART CATH AND CORONARY ANGIOGRAPHY;  Surgeon: Verlin Lonni BIRCH, MD;  Location: [facility];  Service: Cardiovascular;  Laterality: N/A;   LEFT HEART CATH AND CORONARY ANGIOGRAPHY N/A 06/19/2023   Procedure: LEFT HEART CATH AND CORONARY ANGIOGRAPHY;  Surgeon: Wendel Lurena POUR, MD;  Location: [facility];  Service: Cardiovascular;  Laterality: N/A;   LEG SURGERY Right    POLYPECTOMY  04/20/2022   Procedure: POLYPECTOMY;  Surgeon: Cindie Carlin POUR, DO;  Location: [facility];  Service: Endoscopy;;   REPLACEMENT TOTAL KNEE Right    SPINAL CORD STIMULATOR IMPLANT      Current Outpatient Medications  Medication Sig Dispense Refill   aspirin  EC 81 MG tablet Take 1 tablet (81 mg total) by mouth daily. Swallow whole. 90 tablet 3   atorvastatin  (LIPITOR) 80 MG tablet TAKE 1 TABLET BY MOUTH ONCE DAILY AT  6PM 90 tablet 2   calcitRIOL (ROCALTROL) 0.25  MCG capsule Take 0.25 mcg by mouth every Monday, Wednesday, and Friday.     carvedilol  (COREG ) 6.25 MG tablet Take 1 tablet (6.25 mg total) by mouth 2 (two) times daily. 180 tablet 0   Continuous Blood Gluc Sensor (DEXCOM G6 SENSOR) MISC 1 Device by Other route as directed.     EPINEPHRINE  0.3 mg/0.3 mL IJ SOAJ injection INJECT CONTENTS OF ONE PEN INTO THE MUSCLE AS NEEDED FOR ANAPHYLAXIS 2 each 0   furosemide  (LASIX ) 20 MG tablet Take 20-40 mg by mouth See admin instructions. Take 2 tablets in the morning and 1 tablet in the evening     hydrALAZINE  (APRESOLINE ) 25 MG tablet Take 1 tablet (25 mg total) by mouth 3 (three) times daily. 270 tablet 3   Insulin  Human (INSULIN  PUMP) SOLN Inject into the skin continuous.      Insulin  Pen Needle (PEN NEEDLES 31GX5/16) 31G X 8 MM MISC      JARDIANCE 10 MG TABS tablet Take 5 mg by mouth every Monday, Wednesday, and Friday.     KERENDIA 10 MG TABS Take 10 mg by mouth every Monday, Wednesday,  and Friday.     levothyroxine  (SYNTHROID ) 112 MCG tablet Take 112 mcg by mouth daily before breakfast.     metFORMIN  (GLUCOPHAGE ) 500 MG tablet Take 1 tablet (500 mg total) by mouth 2 (two) times daily with a meal.     nitroGLYCERIN  (NITROSTAT ) 0.4 MG SL tablet Place 1 tablet (0.4 mg total) under the tongue every 5 (five) minutes as needed for chest pain. 25 tablet 3   NOVOLOG  100 UNIT/ML injection Inject into the skin continuous. In insulin  pump  0   Omega-3 Fatty Acids (FISH OIL) 1200 MG CAPS Take 1,200 mg by mouth daily.     pantoprazole  (PROTONIX ) 40 MG tablet Take 1 tablet (40 mg total) by mouth 2 (two) times daily before a meal. (Patient taking differently: Take 40 mg by mouth daily.) 60 tablet 3   verapamil  (CALAN ) 80 MG tablet Take 1 tablet (80 mg total) by mouth 3 (three) times daily. 90 tablet 5   No current facility-administered medications for this visit.    Allergies as of 08/08/2023 - Review Complete 08/08/2023  Allergen Reaction Noted   Other Nausea  And Vomiting and Other (See Comments) 08/04/2015   Fentanyl Nausea And Vomiting and Other (See Comments) 11/09/2016    Family History  Problem Relation Age of Onset   Cancer Mother 71       colon   Cancer Father        renal cell   Colon cancer Father    Breast cancer Sister    Cancer Sister        primary brain, and primary breast too   Breast cancer Sister    Cancer Sister        breast    Breast cancer Sister    Breast cancer Sister    Cancer Brother        kidney and liver   Sleep apnea Neg Hx     Social History   Socioeconomic History   Marital status: Widowed    Spouse name: Not on file   Number of children: 0   Years of education: Not on file   Highest education level: Bachelor's degree (e.g., BA, AB, BS)  Occupational History   Occupation: Retired  Tobacco Use   Smoking status: Never   Smokeless tobacco: Never  Vaping Use   Vaping status: Never Used  Substance and Sexual Activity   Alcohol  use: No    Alcohol /week: 0.0 standard drinks of alcohol    Drug use: No   Sexual activity: Not Currently    Partners: Male  Other Topics Concern   Not on file  Social History Narrative   Drinks about 1 cup coffee day.   Cardiac rehab 3x week   One level home with significant other   Bachelors in Accounting      Right handed   No children   widow   Social Drivers of Corporate Investment Banker Strain: Not on file  Food Insecurity: Not on file  Transportation Needs: Not on file  Physical Activity: Not on file  Stress: Not on file  Social Connections: Not on file    Subjective: Review of Systems  Constitutional:  Negative for chills and fever.  HENT:  Negative for congestion and hearing loss.   Eyes:  Negative for blurred vision and double vision.  Respiratory:  Negative for cough and shortness of breath.   Cardiovascular:  Negative for chest pain and palpitations.  Gastrointestinal:  Positive for abdominal pain, constipation and  nausea. Negative for  blood in stool, diarrhea, heartburn, melena and vomiting.  Genitourinary:  Negative for dysuria and urgency.  Musculoskeletal:  Negative for joint pain and myalgias.  Skin:  Negative for itching and rash.  Neurological:  Negative for dizziness and headaches.  Psychiatric/Behavioral:  Negative for depression. The patient is not nervous/anxious.      Objective: BP 114/60 (BP Location: Left Arm, Patient Position: Sitting, Cuff Size: Large)   Pulse 97   Temp 97.8 F (36.6 C) (Temporal)   Ht 5' 8 (1.727 m)   Wt 272 lb 11.2 oz (123.7 kg)   BMI 41.46 kg/m  Physical Exam Constitutional:      Appearance: Normal appearance.  HENT:     Head: Normocephalic and atraumatic.  Eyes:     Extraocular Movements: Extraocular movements intact.     Conjunctiva/sclera: Conjunctivae normal.  Cardiovascular:     Rate and Rhythm: Normal rate and regular rhythm.  Pulmonary:     Effort: Pulmonary effort is normal.     Breath sounds: Normal breath sounds.  Abdominal:     General: Bowel sounds are normal.     Palpations: Abdomen is soft.  Musculoskeletal:        General: No swelling. Normal range of motion.     Cervical back: Normal range of motion and neck supple.  Skin:    General: Skin is warm and dry.     Coloration: Skin is not jaundiced.  Neurological:     General: No focal deficit present.     Mental Status: She is alert and oriented to person, place, and time.  Psychiatric:        Mood and Affect: Mood normal.        Behavior: Behavior normal.      Assessment/Plan:  1.  Irritable bowel syndrome with constipation-has trialed Linzess , Trulance, IBSrella, symptoms seem to be most improved on Amitiza  24 mcg twice daily.  Will restart her on this.  Has taken MiraLAX on top of this as well as fiber therapy but still only averaging bowel movements every 5 days with difficult defecation and hard stools as well as associated abdominal pain.  She tells me today that she is frustrated and  asking about possible surgical treatment.  Discussed that this is typically a last resort option and we need to ensure that this is truly just a slow transit constipation prior to even consider any.  Will proceed with anorectal manometry to further evaluate.  Will add on lactulose  on top of her Amitiza  to take as needed.  Discussed issues with blood sugar with this medication.  Discussed increased abdominal bloating.  Discussed possibility of diarrhea.  2.  Nausea-chronic, see HPI for full workup.  Given her history of type 1 diabetes, she may have gastroparesis despite normal GES.  Consider trial of Reglan pending on clinical course.  3.  Left lower quadrant abdominal pain-CT abdomen pelvis reassuring.  Likely due to irritable bowel syndrome.  Hopefully this improves as we get her bowels moving more effectively.  Follow-up in 6 to 8 weeks   08/08/2023 2:50 PM   Disclaimer: This note was dictated with voice recognition software. Similar sounding words can inadvertently be transcribed and may not be corrected upon review.

## 2023-08-09 ENCOUNTER — Other Ambulatory Visit: Payer: Self-pay | Admitting: *Deleted

## 2023-08-09 DIAGNOSIS — R1084 Generalized abdominal pain: Secondary | ICD-10-CM

## 2023-08-09 DIAGNOSIS — K581 Irritable bowel syndrome with constipation: Secondary | ICD-10-CM

## 2023-08-13 ENCOUNTER — Encounter (INDEPENDENT_AMBULATORY_CARE_PROVIDER_SITE_OTHER): Payer: Self-pay | Admitting: Ophthalmology

## 2023-08-13 ENCOUNTER — Encounter (INDEPENDENT_AMBULATORY_CARE_PROVIDER_SITE_OTHER): Payer: Medicare Other | Admitting: Ophthalmology

## 2023-08-13 ENCOUNTER — Ambulatory Visit (INDEPENDENT_AMBULATORY_CARE_PROVIDER_SITE_OTHER): Payer: Medicare Other | Admitting: Ophthalmology

## 2023-08-13 DIAGNOSIS — Z7985 Long-term (current) use of injectable non-insulin antidiabetic drugs: Secondary | ICD-10-CM | POA: Diagnosis not present

## 2023-08-13 DIAGNOSIS — Z7984 Long term (current) use of oral hypoglycemic drugs: Secondary | ICD-10-CM | POA: Diagnosis not present

## 2023-08-13 DIAGNOSIS — I10 Essential (primary) hypertension: Secondary | ICD-10-CM | POA: Diagnosis not present

## 2023-08-13 DIAGNOSIS — E113513 Type 2 diabetes mellitus with proliferative diabetic retinopathy with macular edema, bilateral: Secondary | ICD-10-CM

## 2023-08-13 DIAGNOSIS — H25813 Combined forms of age-related cataract, bilateral: Secondary | ICD-10-CM

## 2023-08-13 DIAGNOSIS — H35033 Hypertensive retinopathy, bilateral: Secondary | ICD-10-CM | POA: Diagnosis not present

## 2023-08-13 DIAGNOSIS — Z794 Long term (current) use of insulin: Secondary | ICD-10-CM

## 2023-08-13 MED ORDER — AFLIBERCEPT 2MG/0.05ML IZ SOLN FOR KALEIDOSCOPE
2.0000 mg | INTRAVITREAL | Status: AC | PRN
  Administered 2023-08-13: 2 mg via INTRAVITREAL

## 2023-08-13 NOTE — Progress Notes (Signed)
 [facility] - Clinic Note  08/13/2023     CHIEF COMPLAINT Patient presents for Retina Follow Up   HISTORY OF PRESENT ILLNESS: Jodi Ray is a 63 y.o. female who presents to the clinic today for:   HPI     Retina Follow Up   Patient presents with  Diabetic Retinopathy.  In both eyes.  Severity is moderate.  Duration of 6 weeks.  Since onset it is stable.  I, the attending physician,  performed the HPI with the patient and updated documentation appropriately.        Comments   Patient states vision the same OU. BS was 94 this am. Last A1c was 5.7, checked three weeks ago.      Last edited by Valdemar Rogue, MD on 08/13/2023  8:39 AM.    Pt states vision is stable  Referring physician: Maree Isles, MD [address] [place],  [place] [address]  HISTORICAL INFORMATION:   Selected notes from the MEDICAL RECORD NUMBER Referred by Dr. Vivian for concern of DME LEE:  Ocular Hx- PMH-    CURRENT MEDICATIONS: No current outpatient medications on file. (Ophthalmic Drugs)   No current facility-administered medications for this visit. (Ophthalmic Drugs)   Current Outpatient Medications (Other)  Medication Sig   aspirin  EC 81 MG tablet Take 1 tablet (81 mg total) by mouth daily. Swallow whole.   atorvastatin  (LIPITOR) 80 MG tablet TAKE 1 TABLET BY MOUTH ONCE DAILY AT  6PM   calcitRIOL (ROCALTROL) 0.25 MCG capsule Take 0.25 mcg by mouth every Monday, Wednesday, and Friday.   carvedilol  (COREG ) 6.25 MG tablet Take 1 tablet (6.25 mg total) by mouth 2 (two) times daily.   Continuous Blood Gluc Sensor (DEXCOM G6 SENSOR) MISC 1 Device by Other route as directed.   EPINEPHRINE  0.3 mg/0.3 mL IJ SOAJ injection INJECT CONTENTS OF ONE PEN INTO THE MUSCLE AS NEEDED FOR ANAPHYLAXIS   furosemide  (LASIX ) 20 MG tablet Take 20-40 mg by mouth See admin instructions. Take 2 tablets in the morning and 1 tablet in the evening   hydrALAZINE  (APRESOLINE ) 25 MG tablet Take 1 tablet  (25 mg total) by mouth 3 (three) times daily.   Insulin  Human (INSULIN  PUMP) SOLN Inject into the skin continuous.    Insulin  Pen Needle (PEN NEEDLES 31GX5/16) 31G X 8 MM MISC    JARDIANCE 10 MG TABS tablet Take 5 mg by mouth every Monday, Wednesday, and Friday.   KERENDIA 10 MG TABS Take 10 mg by mouth every Monday, Wednesday, and Friday.   Lactulose  20 GM/30ML SOLN Take 15 mLs (10 g total) by mouth 2 (two) times daily.   levothyroxine  (SYNTHROID ) 112 MCG tablet Take 112 mcg by mouth daily before breakfast.   lubiprostone  (AMITIZA ) 24 MCG capsule Take 1 capsule (24 mcg total) by mouth 2 (two) times daily with a meal.   metFORMIN  (GLUCOPHAGE ) 500 MG tablet Take 1 tablet (500 mg total) by mouth 2 (two) times daily with a meal.   nitroGLYCERIN  (NITROSTAT ) 0.4 MG SL tablet Place 1 tablet (0.4 mg total) under the tongue every 5 (five) minutes as needed for chest pain.   NOVOLOG  100 UNIT/ML injection Inject into the skin continuous. In insulin  pump   Omega-3 Fatty Acids (FISH OIL) 1200 MG CAPS Take 1,200 mg by mouth daily.   pantoprazole  (PROTONIX ) 40 MG tablet Take 1 tablet (40 mg total) by mouth 2 (two) times daily before a meal. (Patient taking differently: Take 40 mg by mouth  daily.)   verapamil  (CALAN ) 80 MG tablet Take 1 tablet (80 mg total) by mouth 3 (three) times daily.   No current facility-administered medications for this visit. (Other)   REVIEW OF SYSTEMS: ROS   Positive for: Endocrine, Cardiovascular, Eyes Negative for: Constitutional, Gastrointestinal, Neurological, Skin, Genitourinary, Musculoskeletal, HENT, Respiratory, Psychiatric, Allergic/Imm, Heme/Lymph Last edited by Verneda Auston BIRCH, COT on 08/13/2023  7:44 AM.     ALLERGIES Allergies  Allergen Reactions   Other Nausea And Vomiting and Other (See Comments)    Opiates Alters mental status diarrhea   Fentanyl Nausea And Vomiting and Other (See Comments)    Nausea, vomiting, loss of consciousness, requiring reversal    PAST MEDICAL HISTORY Past Medical History:  Diagnosis Date   Adult RDS (HCC)    Anemia    Brain aneurysm    frontal lobe   CAD (coronary artery disease)    a. 02/2018: s/p DES to Proximal LAD with residual 20% RCA stenosis.   Chronic pain    Diabetes mellitus without complication (HCC)    Headache    History of left bundle branch block (LBBB)    HTN (hypertension)    Hx of cardiovascular stress test 10/2016   intermediate risk study   Hypothyroidism    Neuromuscular disorder (HCC)    Neuropathy    Obesity    OSA on CPAP 03/10/2015   Renal insufficiency    Retinopathy    Sleep apnea    Varicose veins of both lower extremities    Past Surgical History:  Procedure Laterality Date   BIOPSY  11/14/2016   Procedure: BIOPSY;  Surgeon: Margo LITTIE Haddock, MD;  [facility]: [facility];  Service: Endoscopy;;  colon gastric duodenal   BIOPSY  03/19/2023   Procedure: BIOPSY;  Surgeon: Cindie Carlin POUR, DO;  Location: [facility];  Service: Endoscopy;;   BREAST EXCISIONAL BIOPSY Right    BREAST SURGERY Left 1994   Lumpectomy   COLONOSCOPY WITH PROPOFOL  N/A 11/14/2016   Dr. haddock: Moderately redundant rectosigmoid colon. Random colon biopsies benign. Internal hemorrhoids. Surveillance colonoscopy in 5 years.   COLONOSCOPY WITH PROPOFOL  N/A 04/20/2022   Procedure: COLONOSCOPY WITH PROPOFOL ;  Surgeon: Cindie Carlin POUR, DO;  Location: [facility];  Service: Endoscopy;  Laterality: N/A;  12:30am   CORONARY PRESSURE/FFR STUDY N/A 06/19/2023   Procedure: CORONARY PRESSURE/FFR STUDY;  Surgeon: Wendel Lurena POUR, MD;  [facility]: [facility];  Service: Cardiovascular;  Laterality: N/A;   CORONARY STENT INTERVENTION N/A 03/22/2018   Procedure: CORONARY STENT INTERVENTION;  Surgeon: Verlin Lonni BIRCH, MD;  Location: [facility];  Service: Cardiovascular;  Laterality: N/A;   ESOPHAGOGASTRODUODENOSCOPY (EGD) WITH PROPOFOL  N/A 11/14/2016   Dr. haddock: Esophagus normal.  Moderate gastritis, few gastric polyps. Duodenal biopsies negative. Fundic gland polyp gastric polyp area no H pylori.   ESOPHAGOGASTRODUODENOSCOPY (EGD) WITH PROPOFOL  N/A 03/19/2023   Procedure: ESOPHAGOGASTRODUODENOSCOPY (EGD) WITH PROPOFOL ;  Surgeon: Cindie Carlin POUR, DO;  [facility]: [facility];  Service: Endoscopy;  Laterality: N/A;  10:15 am, asa 3   FOOT SURGERY     LEFT HEART CATH AND CORONARY ANGIOGRAPHY N/A 03/22/2018   Procedure: LEFT HEART CATH AND CORONARY ANGIOGRAPHY;  Surgeon: Verlin Lonni BIRCH, MD;  [facility]: [facility];  Service: Cardiovascular;  Laterality: N/A;   LEFT HEART CATH AND CORONARY ANGIOGRAPHY N/A 06/19/2023   Procedure: LEFT HEART CATH AND CORONARY ANGIOGRAPHY;  Surgeon: Wendel Lurena POUR, MD;  [facility]: [facility];  Service: Cardiovascular;  Laterality: N/A;   LEG SURGERY Right    POLYPECTOMY  04/20/2022   Procedure: POLYPECTOMY;  Surgeon: Cindie Carlin POUR, DO;  [facility]: [facility];  Service: Endoscopy;;   REPLACEMENT TOTAL KNEE Right    SPINAL CORD STIMULATOR IMPLANT     FAMILY HISTORY Family History  Problem Relation Age of Onset   Cancer Mother 52       colon   Cancer Father        renal cell   Colon cancer Father    Breast cancer Sister    Cancer Sister        primary brain, and primary breast too   Breast cancer Sister    Cancer Sister        breast    Breast cancer Sister    Breast cancer Sister    Cancer Brother        kidney and liver   Sleep apnea Neg Hx    SOCIAL HISTORY Social History   Tobacco Use   Smoking status: Never   Smokeless tobacco: Never  Vaping Use   Vaping status: Never Used  Substance Use Topics   Alcohol  use: No    Alcohol /week: 0.0 standard drinks of alcohol    Drug use: No       OPHTHALMIC EXAM:  Base Eye Exam     Visual Acuity (Snellen - Linear)       Right Left   Dist cc 20/30 -2 20/30 -2   Dist ph cc 20/25 -2 20/20 -2    Correction: Glasses         Tonometry (Tonopen,  7:52 AM)       Right Left   Pressure 18 20         Pupils       Dark Light Shape React APD   Right 3 2 Round Brisk None   Left 3 2 Round Brisk None         Visual Fields (Counting fingers)       Left Right    Full Full         Extraocular Movement       Right Left    Full, Ortho Full, Ortho         Neuro/Psych     Oriented x3: Yes   Mood/Affect: Normal         Dilation     Both eyes: 1.0% Mydriacyl, 2.5% Phenylephrine  @ 7:53 AM           Slit Lamp and Fundus Exam     Slit Lamp Exam       Right Left   Lids/Lashes Dermatochalasis - upper lid, mild MGD Dermatochalasis - upper lid, mild MGD   Conjunctiva/Sclera nasal pingeucula nasal pingeucula, Trace Injection   Cornea arcus, mild tear film debris arcus, mild tear film debris   Anterior Chamber deep and clear, no cell or flare deep and clear, no cell or flare   Iris Round and dilated, No NVI Round and dilated, No NVI   Lens 2+ Nuclear sclerosis, 2-3+ Cortical cataract, 1+ Posterior subcapsular cataract 2+ Nuclear sclerosis, 2-3+ Cortical cataract, 1+ Posterior subcapsular cataract   Anterior Vitreous Vitreous syneresis Vitreous syneresis         Fundus Exam       Right Left   Disc Pink and Sharp, no NVD, temporal PPA Pink and Sharp, no NVD, temporal PPP   C/D Ratio 0.4 0.4   Macula Good foveal reflex, +cystic change --  improved, scattered MA -- improved Flat, good foveal reflex, scattered MA -- improved, trace cystic changes -- slightly improved   Vessels attenuated, Tortuous attenuated, Tortuous, +NV -- regressing   Periphery Attached, dense 360 PRP extending posteriorly to arcades Attached, 360 PRP with good posterior fill in, room for fill in if needed           Refraction     Wearing Rx       Sphere Cylinder Axis Add   Right +1.75 +1.50 124 +2.50   Left +4.25 +2.25 001 +2.50           IMAGING AND PROCEDURES  Imaging and Procedures for 08/13/2023  OCT, Retina - OU - Both  Eyes       Right Eye Quality was good. Central Foveal Thickness: 322. Progression has improved. Findings include normal foveal contour, no IRF, no SRF (Interval improvement in cystic changes centrally -- resolved).   Left Eye Quality was good. Central Foveal Thickness: 295. Progression has improved. Findings include normal foveal contour, no SRF, intraretinal fluid (Persistent, trace cystic changes greatest temporal macula -- improved).   Notes *Images captured and stored on drive  Diagnosis / Impression:  +DME OU OD: interval improvement in cystic changes centrally -- resolved OS: persistent, trace cystic changes greatest temporal macula -- improved  Clinical management:  See below  Abbreviations: NFP - Normal foveal profile. CME - cystoid macular edema. PED - pigment epithelial detachment. IRF - intraretinal fluid. SRF - subretinal fluid. EZ - ellipsoid zone. ERM - epiretinal membrane. ORA - outer retinal atrophy. ORT - outer retinal tubulation. SRHM - subretinal hyper-reflective material. IRHM - intraretinal hyper-reflective material      Intravitreal Injection, Pharmacologic Agent - OD - Right Eye       Time Out 08/13/2023. 8:26 AM. Confirmed correct patient, procedure, site, and patient consented.   Anesthesia Topical anesthesia was used. Anesthetic medications included Lidocaine  2%, Proparacaine 0.5%.   Procedure Preparation included 5% betadine to ocular surface, eyelid speculum. A (32g) needle was used.   Injection: 2 mg aflibercept  2 MG/0.05ML   Route: Intravitreal, Site: Right Eye   NDC: Q956576, Lot: [id number], Expiration date: 05/30/2024, Waste: 0 mL   Post-op Post injection exam found visual acuity of at least counting fingers. The patient tolerated the procedure well. There were no complications. The patient received written and verbal post procedure care education.            ASSESSMENT/PLAN:    ICD-10-CM   1. Proliferative diabetic retinopathy  of both eyes with macular edema associated with type 2 diabetes mellitus (HCC)  E11.3513 OCT, Retina - OU - Both Eyes    Intravitreal Injection, Pharmacologic Agent - OD - Right Eye    aflibercept  (EYLEA ) SOLN 2 mg    CANCELED: Intravitreal Injection, Pharmacologic Agent - OS - Left Eye    2. Long term (current) use of oral hypoglycemic drugs  Z79.84     3. Long-term (current) use of injectable non-insulin  antidiabetic drugs  Z79.85     4. Current use of insulin  (HCC)  Z79.4     5. Essential hypertension  I10     6. Hypertensive retinopathy of both eyes  H35.033     7. Combined forms of age-related cataract of both eyes  H25.813      1-4. Proliferative diabetic retinopathy OU - previously managed by Dr. Waylan -- lost to f/u since 2020 - s/p PRP OU and IVA OS x7 (BB) - s/p IVA OD #1 (  01.02.24), #2 (02.02.24), #3 (03.01.24), #4(04.05.24), #5 (10.21.24) -- IVA resistance - s/p IVA OS #1 (01.04.24), #2 (02.02.24), #3 (03.01.24), #4(04.05.24), #5 (05.03.24), #6 (06.03.24), #7 (07.01.24), #8 (08.05.24), #9 (09.09.24) - IVA resistance ============================================================= - s/p IVE OD #1 (05.03.24), #2 (06.03.24), #3 (07.01.24), #4 (08.05.24), #5 (09.09.24),#6 (12.10.24) - s/p IVE OS #1 (10.21.24), #2 (12.10.24) - s/p PRP fill in OS (01.23.24) - exam shows scattered MA OU; laser PRP OU - FA (01.02.23) shows OD: Cluster of leakage temporal macula, no NV; OS: Scattered focal NVE greatest inferior and temporal quads -- will need PRP fill in OS -- completed Jan 2024 - BCVA 20/30 OD, 20/20 OS - OCT shows OD: interval improvement in cystic changes centrally -- resolved; OS: persistent, trace cystic changes greatest temporal macula -- improved at 6 weeks - recommend IVE OD #7 today, 01.13.25 w/ f/u in 6 wks - pt wishes to hold off on tx in OS today -- reasonable - pt wishes to proceed with IVE OD  - RBA of procedure discussed, questions answered - see procedure note -  IVE informed consent obtained and signed, 05.03.24 (OU) - f/u 6 weeks -- DFE, OCT, possible injxns  5,6. Hypertensive retinopathy OU - discussed importance of tight BP control  - monitor  7. Mixed Cataract OU - The symptoms of cataract, surgical options, and treatments and risks were discussed with patient. - discussed diagnosis and progression - monitor  Ophthalmic Meds Ordered this visit:  Meds ordered this encounter  Medications   aflibercept  (EYLEA ) SOLN 2 mg     Return in about 6 weeks (around 09/24/2023) for f/u PDR OU, DFE, OCT.  There are no Patient Instructions on file for this visit.  This document serves as a record of services personally performed by Redell JUDITHANN Hans, MD, PhD. It was created on their behalf by Alan PARAS. Delores, OA an ophthalmic technician. The creation of this record is the provider's dictation and/or activities during the visit.    Electronically signed by: Alan PARAS. Delores, OA 08/13/23 12:29 PM  Redell JUDITHANN Hans, M.D., Ph.D. Diseases & Surgery of the Retina and Vitreous [facility] 08/13/2023   I have reviewed the above documentation for accuracy and completeness, and I agree with the above. Redell JUDITHANN Hans, M.D., Ph.D. 08/13/23 12:33 PM    Abbreviations: M myopia (nearsighted); A astigmatism; H hyperopia (farsighted); P presbyopia; Mrx spectacle prescription;  CTL contact lenses; OD right eye; OS left eye; OU both eyes  XT exotropia; ET esotropia; PEK punctate epithelial keratitis; PEE punctate epithelial erosions; DES dry eye syndrome; MGD meibomian gland dysfunction; ATs artificial tears; PFAT's preservative free artificial tears; NSC nuclear sclerotic cataract; PSC posterior subcapsular cataract; ERM epi-retinal membrane; PVD posterior vitreous detachment; RD retinal detachment; DM diabetes mellitus; DR diabetic retinopathy; NPDR non-proliferative diabetic retinopathy; PDR proliferative diabetic retinopathy; CSME clinically  significant macular edema; DME diabetic macular edema; dbh dot blot hemorrhages; CWS cotton wool spot; POAG primary open angle glaucoma; C/D cup-to-disc ratio; HVF humphrey visual field; GVF goldmann visual field; OCT optical coherence tomography; IOP intraocular pressure; BRVO Branch retinal vein occlusion; CRVO central retinal vein occlusion; CRAO central retinal artery occlusion; BRAO branch retinal artery occlusion; RT retinal tear; SB scleral buckle; PPV pars plana vitrectomy; VH Vitreous hemorrhage; PRP panretinal laser photocoagulation; IVK intravitreal kenalog ; VMT vitreomacular traction; MH Macular hole;  NVD neovascularization of the disc; NVE neovascularization elsewhere; AREDS age related eye disease study; ARMD age related macular degeneration; POAG primary open angle glaucoma;  EBMD epithelial/anterior basement membrane dystrophy; ACIOL anterior chamber intraocular lens; IOL intraocular lens; PCIOL posterior chamber intraocular lens; Phaco/IOL phacoemulsification with intraocular lens placement; PRK photorefractive keratectomy; LASIK laser assisted in situ keratomileusis; HTN hypertension; DM diabetes mellitus; COPD chronic obstructive pulmonary disease

## 2023-08-15 ENCOUNTER — Ambulatory Visit (INDEPENDENT_AMBULATORY_CARE_PROVIDER_SITE_OTHER): Payer: Self-pay

## 2023-08-15 DIAGNOSIS — J309 Allergic rhinitis, unspecified: Secondary | ICD-10-CM

## 2023-08-17 DIAGNOSIS — I10 Essential (primary) hypertension: Secondary | ICD-10-CM | POA: Diagnosis not present

## 2023-08-17 DIAGNOSIS — E109 Type 1 diabetes mellitus without complications: Secondary | ICD-10-CM | POA: Diagnosis not present

## 2023-08-17 DIAGNOSIS — Z299 Encounter for prophylactic measures, unspecified: Secondary | ICD-10-CM | POA: Diagnosis not present

## 2023-08-17 DIAGNOSIS — R52 Pain, unspecified: Secondary | ICD-10-CM | POA: Diagnosis not present

## 2023-08-20 ENCOUNTER — Telehealth: Payer: Self-pay | Admitting: Gastroenterology

## 2023-08-20 ENCOUNTER — Encounter: Payer: Self-pay | Admitting: Nurse Practitioner

## 2023-08-20 ENCOUNTER — Telehealth: Payer: Self-pay | Admitting: Internal Medicine

## 2023-08-20 ENCOUNTER — Ambulatory Visit (INDEPENDENT_AMBULATORY_CARE_PROVIDER_SITE_OTHER): Payer: Medicare Other | Admitting: Nurse Practitioner

## 2023-08-20 ENCOUNTER — Telehealth: Payer: Self-pay | Admitting: *Deleted

## 2023-08-20 ENCOUNTER — Other Ambulatory Visit (HOSPITAL_COMMUNITY)
Admission: RE | Admit: 2023-08-20 | Discharge: 2023-08-20 | Disposition: A | Discharge status: Home / Self Care | Payer: Medicare Other | Source: Ambulatory Visit | Attending: Nephrology | Admitting: Nephrology

## 2023-08-20 VITALS — BP 128/64 | HR 72 | Ht 68.0 in | Wt 269.0 lb

## 2023-08-20 DIAGNOSIS — R0609 Other forms of dyspnea: Secondary | ICD-10-CM | POA: Diagnosis not present

## 2023-08-20 DIAGNOSIS — I10 Essential (primary) hypertension: Secondary | ICD-10-CM | POA: Insufficient documentation

## 2023-08-20 DIAGNOSIS — N183 Chronic kidney disease, stage 3 unspecified: Secondary | ICD-10-CM | POA: Insufficient documentation

## 2023-08-20 DIAGNOSIS — I5032 Chronic diastolic (congestive) heart failure: Secondary | ICD-10-CM | POA: Insufficient documentation

## 2023-08-20 DIAGNOSIS — E785 Hyperlipidemia, unspecified: Secondary | ICD-10-CM | POA: Insufficient documentation

## 2023-08-20 DIAGNOSIS — E1022 Type 1 diabetes mellitus with diabetic chronic kidney disease: Secondary | ICD-10-CM | POA: Diagnosis not present

## 2023-08-20 DIAGNOSIS — Z955 Presence of coronary angioplasty implant and graft: Secondary | ICD-10-CM | POA: Insufficient documentation

## 2023-08-20 DIAGNOSIS — I25119 Atherosclerotic heart disease of native coronary artery with unspecified angina pectoris: Secondary | ICD-10-CM | POA: Insufficient documentation

## 2023-08-20 DIAGNOSIS — I130 Hypertensive heart and chronic kidney disease with heart failure and stage 1 through stage 4 chronic kidney disease, or unspecified chronic kidney disease: Secondary | ICD-10-CM | POA: Diagnosis not present

## 2023-08-20 DIAGNOSIS — Z794 Long term (current) use of insulin: Secondary | ICD-10-CM | POA: Insufficient documentation

## 2023-08-20 DIAGNOSIS — I2081 Angina pectoris with coronary microvascular dysfunction: Secondary | ICD-10-CM | POA: Insufficient documentation

## 2023-08-20 DIAGNOSIS — G4733 Obstructive sleep apnea (adult) (pediatric): Secondary | ICD-10-CM | POA: Diagnosis not present

## 2023-08-20 DIAGNOSIS — Z7182 Exercise counseling: Secondary | ICD-10-CM | POA: Diagnosis not present

## 2023-08-20 LAB — CBC
HCT: 35.7 % — ABNORMAL LOW (ref 36.0–46.0)
Hemoglobin: 11.2 g/dL — ABNORMAL LOW (ref 12.0–15.0)
MCH: 26.7 pg (ref 26.0–34.0)
MCHC: 31.4 g/dL (ref 30.0–36.0)
MCV: 85.2 fL (ref 80.0–100.0)
Platelets: 238 10*3/uL (ref 150–400)
RBC: 4.19 MIL/uL (ref 3.87–5.11)
RDW: 16.2 % — ABNORMAL HIGH (ref 11.5–15.5)
WBC: 5.6 10*3/uL (ref 4.0–10.5)
nRBC: 0 % (ref 0.0–0.2)

## 2023-08-20 LAB — RENAL FUNCTION PANEL
Albumin: 3.6 g/dL (ref 3.5–5.0)
Anion gap: 11 (ref 5–15)
BUN: 38 mg/dL — ABNORMAL HIGH (ref 8–23)
CO2: 30 mmol/L (ref 22–32)
Calcium: 9.2 mg/dL (ref 8.9–10.3)
Chloride: 99 mmol/L (ref 98–111)
Creatinine, Ser: 1.59 mg/dL — ABNORMAL HIGH (ref 0.44–1.00)
GFR, Estimated: 36 mL/min — ABNORMAL LOW (ref >60)
Glucose, Bld: 65 mg/dL — ABNORMAL LOW (ref 70–99)
Phosphorus: 3.8 mg/dL (ref 2.5–4.6)
Potassium: 4.3 mmol/L (ref 3.5–5.1)
Sodium: 140 mmol/L (ref 135–145)

## 2023-08-20 LAB — PROTEIN / CREATININE RATIO, URINE
Creatinine, Urine: 38 mg/dL
Total Protein, Urine: <6 mg/dL

## 2023-08-20 NOTE — Telephone Encounter (Signed)
Good afternoon Dr. Lavon Paganini  The following patient is being referred to [place] from Dr. Marletta Lor at [facility] for an anorectal manometry. Dr. Marletta Lor is not dismissing this patient, he just knows you are the only one who is eligible to perform this test. Are you willing to accommodate this patient. Please advise of scheduling. Thank you.

## 2023-08-20 NOTE — Patient Instructions (Addendum)

## 2023-08-20 NOTE — Progress Notes (Addendum)
Cardiology Office Note:  .   Date:  08/20/2023  ID:  RICKETTA COLANTONIO, DOB 1960/10/14, MRN [id number] PCP: Kirstie Peri, MD  [facility] Providers Cardiologist:  Dina Rich, MD    History of Present Illness: Jodi Ray is a 63 y.o. female with a PMH of CAD, HTN, HLD, chronic diastolic CHF, type 1 diabetes, OSA on CPAP, CKD stage 3, chronic LBBB, dizziness, and palpitations, who presents today for 3-4 week follow-up.   Last seen by Dr. Dina Rich on June 13, 2023.  Patient had noted ongoing chest pains, there was some progression in frequency of her pain as progressing DOE, stress test 1 year before was benign.  She noted that her chest pain was similar to when she had a stent several years prior.  Cardiac catheterization was arranged.  Underwent left heart cath on June 19, 2023 that revealed patent proximal LAD stent with mild in-stent restenosis and mild disease of RCA, evidence of coronary microvascular dysfunction with CFR of less than 2.  It was recommended that given evidence of coronary microvascular dysfunction, would avoid nitrate containing compounds and consider ACE inhibitors, ARB's, statins, CCB, BB, ranolazine and ivabradine.  I last saw patient for follow-up on July 23, 2023 for 3 to 4-week follow-up postcardiac catheterization.  Continued to admit to chest pain as well as dyspnea on exertion that appears to be worsening.  Stated shortness of breath occurred while coming in from her car to the office, she noted shortness of breath.  Was wanting to know if she can restart cardiac rehab, as she did well with this in the past.  Her PCP recently put her back on carvedilol, denied any low BPs.  Had a recent episode with low blood sugar that caused a passing out episode.  Denied any recurrence in this. Denied any palpitations, presyncope, dizziness, orthopnea, PND, significant weight changes, acute bleeding, or claudication. Admitted to stable lower  extremity edema.   08/20/2023-presents today for follow-up.  Continues to mid to stable chest pain episodes.  Denies any nitroglycerin use.  Says she enjoys her cycling classes but has not been able to do this recently as she says an increase in her heart rate with exercise causes chest pain. Denies any shortness of breath, palpitations, syncope, presyncope, dizziness, orthopnea, PND, swelling or significant weight changes, acute bleeding, or claudication.  Wanting to know if she can restart cardiac rehab.  ROS: Negative.  See HPI. 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   Studies Reviewed: Marland Kitchen    EKG: EKG is not ordered today.   LHC 06/2023:    Prox LAD lesion is 10% stenosed.   1.  Occluded right radial necessitating right ulnar approach. 2.  Patent proximal LAD stent with mild in-stent restenosis and mild disease of the right coronary artery. 3.  LVEDP of 18 mmHg. 4.  Evidence of coronary microvascular dysfunction with a CFR of less than 2.  The index of microvascular resistance is negative at 20.   Recommendation: Given evidence of coronary microvascular dysfunction would avoid nitrate containing compounds and consider the  ACE inhibitors, ARBs, statins, calcium channel blockers, beta-blockers, ranolazine, and ivadrabine.  Cardiac monitor 02/2022:    14 day monitor   Rare supraventricular ectopy in the form of isolated PACs, couplets, triplets. 6 runs of SVT longest 10 beats.   Rare ventricular ectopy in the form of isolated PVCs, couplets   Reported symptoms correlated with sinus rhythm, rare PACs and PVCs  Patch Wear Time:  14 days and 0 hours (2023-07-13T10:14:59-0400 to 2023-07-27T10:15:09-398)   Patient had a min HR of 67 bpm, max HR of 171 bpm, and avg HR of 86 bpm. Predominant underlying rhythm was Sinus Rhythm. Bundle Branch Block/IVCD was present. 6 Supraventricular Tachycardia runs occurred, the run with the fastest interval lasting 9 beats  with a max rate of 171 bpm, the longest  lasting 10 beats with an avg rate of 112 bpm. Isolated SVEs were rare (<1.0%), SVE Couplets were rare (<1.0%), and SVE Triplets were rare (<1.0%). Isolated VEs were rare (<1.0%), VE Couplets were rare (<1.0%), and  no VE Triplets were present. Ventricular Bigeminy and Trigeminy were present.  Lexiscan 01/2022:    The study is normal. The study is low risk.   No ST deviation was noted.   LV perfusion is normal. There is no evidence of ischemia. There is no evidence of infarction.   Left ventricular function is normal.EF 75%  End diastolic cavity size is normal. End systolic cavity size is normal.  Echo 10/2020:  1. Left ventricular ejection fraction, by estimation, is 60 to 65%. The  left ventricle has normal function. The left ventricle has no regional  wall motion abnormalities. Left ventricular diastolic parameters are  consistent with Grade I diastolic  dysfunction (impaired relaxation).   2. Right ventricular systolic function is normal. The right ventricular  size is normal.   3. Left atrial size was severely dilated.   4. Right atrial size was mildly dilated.   5. The mitral valve is normal in structure. No evidence of mitral valve  regurgitation. No evidence of mitral stenosis.   6. The aortic valve is tricuspid. There is mild calcification of the  aortic valve. There is mild thickening of the aortic valve. Aortic valve  regurgitation is not visualized. No aortic stenosis is present.   7. The inferior vena cava is normal in size with greater than 50%  respiratory variability, suggesting right atrial pressure of 3 mmHg.  Physical Exam:   VS:  BP 128/64   Pulse 72   Ht 5\' 8"  (1.727 m)   Wt 269 lb (122 kg)   SpO2 98%   BMI 40.90 kg/m    Wt Readings from Last 3 Encounters:  08/20/23 269 lb (122 kg)  08/08/23 272 lb 11.2 oz (123.7 kg)  07/23/23 267 lb 9.6 oz (121.4 kg)    GEN: Morbidly obese, 63 year old female in no acute distress NECK: No JVD; No carotid bruits CARDIAC:  S1/S2, RRR, no murmurs, rubs, gallops RESPIRATORY:  Clear to auscultation without rales, wheezing or rhonchi  ABDOMEN: Soft, non-tender, non-distended EXTREMITIES:  Edema to BLE; No deformity   ASSESSMENT AND PLAN: .    Coronary microvascular dysfunction, CAD, DOE Continues to admit to stable symptoms, specifically stable anginal episodes - see left heart cath noted above.  There is evidence of coronary microvascular dysfunction noted on heart cath.  Was recommended to avoid nitrate containing compounds and consider ACE inhibitors, ARB's, statins, calcium channel blockers, beta-blockers, Ranexa, and ivabradine.  PCP recently put her back on low-dose carvedilol.  Continue rest of medication regimen.  Because increase in carvedilol did not improve her symptoms, we will consider Ranexa as next agent-previously checked and there are no contraindications noted to starting this medication.  Will route note to cardiologist for further recs.  Will route note to Dr. Wyline Mood to see if patient is a good candidate for cardiac rehab.    Addendum 08/27/2023 - Recently  heard back from Dr. Wyline Mood and stated both should be okay. Will place order in for Ranexa 500 mg twice daily and for cardiac rehab.   HTN BP stable today, but BP log from home shows consistently SBP greater than 140.  She is checking her BP 4 times per day and encouraged her to monitor BP at home at least 2 hours after medications and sitting for 5-10 minutes.  Discussed when to notify office regarding her SBP readings.  She verbalized understanding.  Continue rest of medication regimen. Heart healthy diet and regular cardiovascular exercise encouraged.   HLD Past LDL was reviewed and was at goal.  Continue current medication regimen. Heart healthy diet and regular cardiovascular exercise encouraged.   Chronic diastolic CHF TTE in 2022 revealed EF 60 to 65%, grade 1 DD. Euvolemic and well compensated on exam.  Continue current medication regimen.  Low sodium diet, fluid restriction <2L, and daily weights encouraged. Educated to contact our office for weight gain of 2 lbs overnight or 5 lbs in one week.  Addendum 08/27/2023 - Recently heard back from Dr. Wyline Mood and stated both should be okay. Will place order in for Ranexa 500 mg twice daily and for cardiac rehab.   CKD stage 3 Most recent lab work appears stable.  Avoid nephrotoxic agents.   No medication changes at this time.  Continue follow-up with PCP and nephrology.  6.  Type 1 diabetes Denies any recent issues.  Her care is being managed by an endocrinologist.  Continue follow-up with endocrinology. Care and ED precautions discussed.  Diabetic, heart healthy diet recommended.   Dispo: Follow-up with me/APP in 6-8 weeks or sooner if anything changes.  Signed, Sharlene Dory, NP

## 2023-08-20 NOTE — Telephone Encounter (Signed)
I called and spoke with patient.  I do not plan on dismissing her from our practice, I just wanted her to undergo anorectal manometry for further evaluation of her significant chronic constipation and follow back up with me.  I did discuss this may require an office visit with [facility] and she is agreeable.  I very much appreciate Dr. Elana Alm help in this regard.

## 2023-08-20 NOTE — Telephone Encounter (Signed)
Patient called in very upset that Dr. Marletta Lor was "cutting her off" as a patient. I advised her he was not "cutting her off" as stated at her OV her was referring her to have anorectal manometry done by [facility] as we do not do this procedure here. She stated this was not made clear to her and just thought [facility] was going to call her. I advised her that it will be up to [facility] if she has to have an OV prior to scheduling her procedure but since we do not do these here, she will have to have it done there. She was then fine at end of call as she understood now.

## 2023-08-20 NOTE — Telephone Encounter (Signed)
noted 

## 2023-08-21 NOTE — Telephone Encounter (Signed)
Sure, these cases for esophageal or anorectal manometry can be scheduled as direct procedures at [facility] end[facility] center.  I do not have to review these cases prior to scheduling.  Thank you

## 2023-08-22 ENCOUNTER — Ambulatory Visit (INDEPENDENT_AMBULATORY_CARE_PROVIDER_SITE_OTHER): Payer: Self-pay

## 2023-08-22 DIAGNOSIS — J309 Allergic rhinitis, unspecified: Secondary | ICD-10-CM | POA: Diagnosis not present

## 2023-08-22 NOTE — Telephone Encounter (Signed)
PT needs to be scheduled for manometry at [facility]. Please advise. Thank you.

## 2023-08-22 NOTE — Telephone Encounter (Signed)
01/16/24 8:30 

## 2023-08-23 ENCOUNTER — Other Ambulatory Visit: Payer: Self-pay

## 2023-08-23 ENCOUNTER — Encounter: Payer: Self-pay | Admitting: Internal Medicine

## 2023-08-23 NOTE — Telephone Encounter (Signed)
Referral for anorectal manometry Referring provider Dr Marletta Lor Appointment and procedure information mailed to the patient. Telephone message left for the patient.

## 2023-08-27 ENCOUNTER — Other Ambulatory Visit: Payer: Self-pay | Admitting: Cardiology

## 2023-08-27 ENCOUNTER — Telehealth: Payer: Self-pay | Admitting: Nurse Practitioner

## 2023-08-27 ENCOUNTER — Other Ambulatory Visit: Payer: Self-pay | Admitting: *Deleted

## 2023-08-27 ENCOUNTER — Other Ambulatory Visit (HOSPITAL_COMMUNITY): Payer: Self-pay

## 2023-08-27 DIAGNOSIS — I2089 Other forms of angina pectoris: Secondary | ICD-10-CM

## 2023-08-27 DIAGNOSIS — K5900 Constipation, unspecified: Secondary | ICD-10-CM

## 2023-08-27 DIAGNOSIS — K581 Irritable bowel syndrome with constipation: Secondary | ICD-10-CM

## 2023-08-27 MED ORDER — RANOLAZINE ER 500 MG PO TB12: 500.0000 mg | ORAL_TABLET | Freq: Two times a day (BID) | ORAL | 2 refills | Status: DC

## 2023-08-27 NOTE — Telephone Encounter (Signed)
Called and spoke with patient about how I recently heard back from Dr. Wyline Mood who agreed with starting Ranexa and placing referral in for cardiac rehab. Sent in Rx for Ranexa 500 mg BID and order placed for cardiac rehab and explained protocol to patient. Explained that referral for cardiac rehab needs to be cosigned. She verbalized understanding. Offered support.   Will route this telephone note to Dr. Wyline Mood so he is aware.   Sharlene Dory, NP

## 2023-08-27 NOTE — Addendum Note (Signed)
Addended by: Sharlene Dory on: 08/27/2023 11:18 AM   Modules accepted: Orders

## 2023-08-28 ENCOUNTER — Encounter (HOSPITAL_COMMUNITY): Payer: Self-pay

## 2023-08-29 ENCOUNTER — Ambulatory Visit (INDEPENDENT_AMBULATORY_CARE_PROVIDER_SITE_OTHER): Payer: Self-pay | Admitting: *Deleted

## 2023-08-29 DIAGNOSIS — J309 Allergic rhinitis, unspecified: Secondary | ICD-10-CM | POA: Diagnosis not present

## 2023-09-05 ENCOUNTER — Encounter (HOSPITAL_COMMUNITY)
Admission: RE | Admit: 2023-09-05 | Discharge: 2023-09-05 | Disposition: A | Discharge status: Home / Self Care | Payer: Medicare Other | Source: Ambulatory Visit | Attending: Cardiology | Admitting: Cardiology

## 2023-09-05 DIAGNOSIS — E109 Type 1 diabetes mellitus without complications: Secondary | ICD-10-CM | POA: Diagnosis not present

## 2023-09-05 DIAGNOSIS — I2089 Other forms of angina pectoris: Secondary | ICD-10-CM | POA: Insufficient documentation

## 2023-09-05 DIAGNOSIS — E785 Hyperlipidemia, unspecified: Secondary | ICD-10-CM | POA: Diagnosis not present

## 2023-09-05 DIAGNOSIS — I10 Essential (primary) hypertension: Secondary | ICD-10-CM | POA: Diagnosis not present

## 2023-09-05 NOTE — Progress Notes (Signed)
Patient referred to cardiac rehab with stable angina. Her virtual orientation visit was completed today and she is scheduled for on-site orientation visit Monday 09/10/23 at 0800.

## 2023-09-07 ENCOUNTER — Ambulatory Visit (INDEPENDENT_AMBULATORY_CARE_PROVIDER_SITE_OTHER): Payer: Medicare Other

## 2023-09-07 DIAGNOSIS — J309 Allergic rhinitis, unspecified: Secondary | ICD-10-CM

## 2023-09-10 ENCOUNTER — Encounter (HOSPITAL_COMMUNITY)
Admission: RE | Admit: 2023-09-10 | Discharge: 2023-09-10 | Disposition: A | Discharge status: Home / Self Care | Payer: Medicare Other | Source: Ambulatory Visit | Attending: Cardiology | Admitting: Cardiology

## 2023-09-10 VITALS — Ht 66.5 in | Wt 272.9 lb

## 2023-09-10 DIAGNOSIS — I2089 Other forms of angina pectoris: Secondary | ICD-10-CM

## 2023-09-10 NOTE — Progress Notes (Signed)
Cardiac Individual Treatment Plan  Patient Details  Name: Jodi Ray MRN: [id number] Date of Birth: 1960/10/26 Referring Provider:   Flowsheet Row CARDIAC REHAB PHASE II ORIENTATION from 09/10/2023 in [facility] CARDIAC REHABILITATION  Referring Provider Dina Rich MD       Initial Encounter Date:  Flowsheet Row CARDIAC REHAB PHASE II ORIENTATION from 09/10/2023 in [place] CARDIAC REHABILITATION  Date 09/10/23       Visit Diagnosis: Stable angina (HCC)  Patient's Home Medications on Admission:  Current Outpatient Medications:    aspirin EC 81 MG tablet, Take 1 tablet (81 mg total) by mouth daily. Swallow whole., Disp: 90 tablet, Rfl: 3   atorvastatin (LIPITOR) 80 MG tablet, TAKE 1 TABLET BY MOUTH ONCE DAILY AT  6PM, Disp: 90 tablet, Rfl: 3   calcitRIOL (ROCALTROL) 0.25 MCG capsule, Take 0.25 mcg by mouth every Monday, Wednesday, and Friday., Disp: , Rfl:    carvedilol (COREG) 6.25 MG tablet, Take 1 tablet (6.25 mg total) by mouth 2 (two) times daily., Disp: 180 tablet, Rfl: 0   Continuous Blood Gluc Sensor (DEXCOM G6 SENSOR) MISC, 1 Device by Other route as directed., Disp: , Rfl:    EPINEPHRINE 0.3 mg/0.3 mL IJ SOAJ injection, INJECT CONTENTS OF ONE PEN INTO THE MUSCLE AS NEEDED FOR ANAPHYLAXIS, Disp: 2 each, Rfl: 0   furosemide (LASIX) 20 MG tablet, Take 20-40 mg by mouth See admin instructions. Take 2 tablets in the morning and 2 tablet in the evening, Disp: , Rfl:    hydrALAZINE (APRESOLINE) 25 MG tablet, Take 1 tablet (25 mg total) by mouth 3 (three) times daily., Disp: 270 tablet, Rfl: 3   Insulin Human (INSULIN PUMP) SOLN, Inject into the skin continuous. , Disp: , Rfl:    Insulin Pen Needle (PEN NEEDLES 31GX5/16") 31G X 8 MM MISC, , Disp: , Rfl:    JARDIANCE 10 MG TABS tablet, Take 5 mg by mouth every Monday, Wednesday, and Friday., Disp: , Rfl:    KERENDIA 10 MG TABS, Take 10 mg by mouth every Monday, Wednesday, and Friday., Disp: , Rfl:    Lactulose 20  GM/30ML SOLN, Take 15 mLs (10 g total) by mouth 2 (two) times daily., Disp: 900 mL, Rfl: 3   levothyroxine (SYNTHROID) 112 MCG tablet, Take 112 mcg by mouth daily before breakfast., Disp: , Rfl:    lubiprostone (AMITIZA) 24 MCG capsule, Take 1 capsule (24 mcg total) by mouth 2 (two) times daily with a meal., Disp: 60 capsule, Rfl: 11   metFORMIN (GLUCOPHAGE) 500 MG tablet, Take 1 tablet (500 mg total) by mouth 2 (two) times daily with a meal., Disp: , Rfl:    nitroGLYCERIN (NITROSTAT) 0.4 MG SL tablet, Place 1 tablet (0.4 mg total) under the tongue every 5 (five) minutes as needed for chest pain. (Patient not taking: Reported on 09/05/2023), Disp: 25 tablet, Rfl: 3   NOVOLOG 100 UNIT/ML injection, Inject into the skin continuous. In insulin pump, Disp: , Rfl: 0   Omega-3 Fatty Acids (FISH OIL) 1200 MG CAPS, Take 1,200 mg by mouth daily., Disp: , Rfl:    pantoprazole (PROTONIX) 40 MG tablet, Take 1 tablet (40 mg total) by mouth 2 (two) times daily before a meal. (Patient taking differently: Take 40 mg by mouth daily.), Disp: 60 tablet, Rfl: 3   ranolazine (RANEXA) 500 MG 12 hr tablet, Take 1 tablet (500 mg total) by mouth 2 (two) times daily., Disp: 60 tablet, Rfl: 2   verapamil (CALAN) 80 MG tablet, Take  1 tablet (80 mg total) by mouth 3 (three) times daily., Disp: 90 tablet, Rfl: 5  Past Medical History: Past Medical History:  Diagnosis Date   Adult RDS (HCC)    Anemia    Brain aneurysm    frontal lobe   CAD (coronary artery disease)    a. 02/2018: s/p DES to Proximal LAD with residual 20% RCA stenosis.   Chronic pain    Diabetes mellitus without complication (HCC)    Headache    History of left bundle branch block (LBBB)    HTN (hypertension)    Hx of cardiovascular stress test 10/2016   intermediate risk study   Hypothyroidism    Neuromuscular disorder (HCC)    Neuropathy    Obesity    OSA on CPAP 03/10/2015   Renal insufficiency    Retinopathy    Sleep apnea    Varicose veins of  both lower extremities     Tobacco Use: Social History   Tobacco Use  Smoking Status Never  Smokeless Tobacco Never    Labs: Review Flowsheet  More data exists      Latest Ref Rng & Units 07/23/2015 03/20/2018 04/16/2018 11/15/2019 06/28/2022  Labs for ITP Cardiac and Pulmonary Rehab  Cholestrol 0 - 200 mg/dL - - 664  - -  LDL (calc) 0 - 99 mg/dL - - 54  - -  HDL-C >40 mg/dL - - 75  - -  Trlycerides <150 mg/dL - - 23  - -  Hemoglobin A1c 4.8 - 5.6 % 9.1  7.8  - 7.8  6.3   Bicarbonate 20.0 - 28.0 mmol/L - - - 13.2  -  Acid-base deficit 0.0 - 2.0 mmol/L - - - 12.4  -  O2 Saturation % - - - 31.8  -    Capillary Blood Glucose: Lab Results  Component Value Date   GLUCAP 116 (H) 06/19/2023   GLUCAP 159 (H) 06/19/2023   GLUCAP 115 (H) 03/19/2023   GLUCAP 70 03/19/2023   GLUCAP 94 04/20/2022     Exercise Target Goals: Exercise Program Goal: Individual exercise prescription set using results from initial 6 min walk test and THRR while considering  patient's activity barriers and safety.   Exercise Prescription Goal: Starting with aerobic activity 30 plus minutes a day, 3 days per week for initial exercise prescription. Provide home exercise prescription and guidelines that participant acknowledges understanding prior to discharge.  Activity Barriers & Risk Stratification:  Activity Barriers & Cardiac Risk Stratification - 09/05/23 1307       Activity Barriers & Cardiac Risk Stratification   Activity Barriers History of Falls;Balance Concerns;Chest Pain/Angina;Assistive Device;Back Problems;Deconditioning;Shortness of Breath    Cardiac Risk Stratification High             6 Minute Walk:  6 Minute Walk     Row Name 09/10/23 0943         6 Minute Walk   Phase Initial     Distance 810 feet     Walk Time 6 minutes     # of Rest Breaks 1     MPH 1.53     METS 1.53     RPE 12     Perceived Dyspnea  1     VO2 Peak 5.37     Symptoms Yes (comment)     Comments  one standing 30 second break     Resting HR 72 bpm     Resting BP 120/60     Resting Oxygen  Saturation  96 %     Exercise Oxygen Saturation  during 6 min walk 97 %     Max Ex. HR 2.44 bpm     Max Ex. BP 156/64     2 Minute Post BP 140/60              Oxygen Initial Assessment:   Oxygen Re-Evaluation:   Oxygen Discharge (Final Oxygen Re-Evaluation):   Initial Exercise Prescription:  Initial Exercise Prescription - 09/10/23 0900       Date of Initial Exercise RX and Referring Provider   Date 09/10/23    Referring Provider Dina Rich MD      NuStep   Level 2    SPM 50    Minutes 15    METs 2      Recumbant Elliptical   Level 2    RPM 50    Minutes 15    METs 3      Prescription Details   Frequency (times per week) 3    Duration Progress to 30 minutes of continuous aerobic without signs/symptoms of physical distress      Intensity   THRR 40-80% of Max Heartrate 106-139    Ratings of Perceived Exertion 11-13    Perceived Dyspnea 0-4      Resistance Training   Training Prescription Yes    Weight 3    Reps 10-15             Perform Capillary Blood Glucose checks as needed.  Exercise Prescription Changes:   Exercise Prescription Changes     Row Name 09/10/23 0900             Response to Exercise   Blood Pressure (Admit) 120/60       Blood Pressure (Exercise) 156/64       Blood Pressure (Exit) 140/60       Heart Rate (Admit) 72 bpm       Heart Rate (Exercise) 94 bpm       Heart Rate (Exit) 76 bpm       Oxygen Saturation (Admit) 96 %       Oxygen Saturation (Exercise) 97 %       Oxygen Saturation (Exit) 97 %       Rating of Perceived Exertion (Exercise) 12       Perceived Dyspnea (Exercise) 1       Duration Progress to 30 minutes of  aerobic without signs/symptoms of physical distress                Exercise Comments:   Exercise Goals and Review:   Exercise Goals     Row Name 09/10/23 0948             Exercise  Goals   Increase Physical Activity Yes       Intervention Provide advice, education, support and counseling about physical activity/exercise needs.;Develop an individualized exercise prescription for aerobic and resistive training based on initial evaluation findings, risk stratification, comorbidities and participant's personal goals.       Expected Outcomes Short Term: Attend rehab on a regular basis to increase amount of physical activity.;Long Term: Add in home exercise to make exercise part of routine and to increase amount of physical activity.;Long Term: Exercising regularly at least 3-5 days a week.       Increase Strength and Stamina Yes       Intervention Provide advice, education, support and counseling about physical activity/exercise needs.;Develop an individualized exercise prescription for  aerobic and resistive training based on initial evaluation findings, risk stratification, comorbidities and participant's personal goals.       Expected Outcomes Short Term: Increase workloads from initial exercise prescription for resistance, speed, and METs.;Short Term: Perform resistance training exercises routinely during rehab and add in resistance training at home;Long Term: Improve cardiorespiratory fitness, muscular endurance and strength as measured by increased METs and functional capacity ( )       Able to understand and use rate of perceived exertion (RPE) scale Yes       Intervention Provide education and explanation on how to use RPE scale       Expected Outcomes Short Term: Able to use RPE daily in rehab to express subjective intensity level;Long Term:  Able to use RPE to guide intensity level when exercising independently       Able to understand and use Dyspnea scale Yes       Intervention Provide education and explanation on how to use Dyspnea scale       Expected Outcomes Short Term: Able to use Dyspnea scale daily in rehab to express subjective sense of shortness of breath during  exertion;Long Term: Able to use Dyspnea scale to guide intensity level when exercising independently       Knowledge and understanding of Target Heart Rate Range (THRR) Yes       Intervention Provide education and explanation of THRR including how the numbers were predicted and where they are located for reference       Expected Outcomes Short Term: Able to state/look up THRR;Long Term: Able to use THRR to govern intensity when exercising independently;Short Term: Able to use daily as guideline for intensity in rehab       Able to check pulse independently Yes       Intervention Provide education and demonstration on how to check pulse in carotid and radial arteries.;Review the importance of being able to check your own pulse for safety during independent exercise       Expected Outcomes Long Term: Able to check pulse independently and accurately;Short Term: Able to explain why pulse checking is important during independent exercise       Understanding of Exercise Prescription Yes       Intervention Provide education, explanation, and written materials on patient's individual exercise prescription       Expected Outcomes Short Term: Able to explain program exercise prescription;Long Term: Able to explain home exercise prescription to exercise independently                Exercise Goals Re-Evaluation :    Discharge Exercise Prescription (Final Exercise Prescription Changes):  Exercise Prescription Changes - 09/10/23 0900       Response to Exercise   Blood Pressure (Admit) 120/60    Blood Pressure (Exercise) 156/64    Blood Pressure (Exit) 140/60    Heart Rate (Admit) 72 bpm    Heart Rate (Exercise) 94 bpm    Heart Rate (Exit) 76 bpm    Oxygen Saturation (Admit) 96 %    Oxygen Saturation (Exercise) 97 %    Oxygen Saturation (Exit) 97 %    Rating of Perceived Exertion (Exercise) 12    Perceived Dyspnea (Exercise) 1    Duration Progress to 30 minutes of  aerobic without  signs/symptoms of physical distress             Nutrition:  Target Goals: Understanding of nutrition guidelines, daily intake of sodium 1500mg , cholesterol 200mg , calories 30% from fat  and 7% or less from saturated fats, daily to have 5 or more servings of fruits and vegetables.  Biometrics:  Pre Biometrics - 09/10/23 0949       Pre Biometrics   Height 5' 6.5" (1.689 m)    Weight 123.8 kg    Waist Circumference 46 inches    Hip Circumference 54 inches    Waist to Hip Ratio 0.85 %    BMI (Calculated) 43.4    Grip Strength 28 kg              Nutrition Therapy Plan and Nutrition Goals:   Nutrition Assessments:  MEDIFICTS Score Key: >=70 Need to make dietary changes  40-70 Heart Healthy Diet <= 40 Therapeutic Level Cholesterol Diet   Picture Your Plate Scores: <96 Unhealthy dietary pattern with much room for improvement. 41-50 Dietary pattern unlikely to meet recommendations for good health and room for improvement. 51-60 More healthful dietary pattern, with some room for improvement.  >60 Healthy dietary pattern, although there may be some specific behaviors that could be improved.    Nutrition Goals Re-Evaluation:   Nutrition Goals Discharge (Final Nutrition Goals Re-Evaluation):   Psychosocial: Target Goals: Acknowledge presence or absence of significant depression and/or stress, maximize coping skills, provide positive support system. Participant is able to verbalize types and ability to use techniques and skills needed for reducing stress and depression.  Initial Review & Psychosocial Screening:  Initial Psych Review & Screening - 09/05/23 1326       Initial Review   Current issues with None Identified      Family Dynamics   Good Support System? Yes      Barriers   Psychosocial barriers to participate in program There are no identifiable barriers or psychosocial needs.      Screening Interventions   Interventions Encouraged to exercise;To  provide support and resources with identified psychosocial needs;Provide feedback about the scores to participant    Expected Outcomes Short Term goal: Identification and review with participant of any Quality of Life or Depression concerns found by scoring the questionnaire.;Long Term goal: The participant improves quality of Life and PHQ9 Scores as seen by post scores and/or verbalization of changes;Long Term Goal: Stressors or current issues are controlled or eliminated.;Short Term goal: Utilizing psychosocial counselor, staff and physician to assist with identification of specific Stressors or current issues interfering with healing process. Setting desired goal for each stressor or current issue identified.             Quality of Life Scores:  Scores of 19 and below usually indicate a poorer quality of life in these areas.  A difference of  2-3 points is a clinically meaningful difference.  A difference of 2-3 points in the total score of the Quality of Life Index has been associated with significant improvement in overall quality of life, self-image, physical symptoms, and general health in studies assessing change in quality of life.  PHQ-9: Review Flowsheet  More data exists      09/10/2023 09/26/2018 06/17/2018 04/09/2017 08/04/2015  Depression screen PHQ 2/9  Decreased Interest 0 0 0 0 0  Down, Depressed, Hopeless 0 0 0 0 0  PHQ - 2 Score 0 0 0 0 0  Altered sleeping 0 3 0 - -  Tired, decreased energy 1 0 0 - -  Change in appetite 0 0 0 - -  Feeling bad or failure about yourself  0 0 0 - -  Trouble concentrating 0 0 0 - -  Moving slowly or fidgety/restless 0 0 0 - -  Suicidal thoughts 0 0 0 - -  PHQ-9 Score 1 3 0 - -  Difficult doing work/chores Not difficult at all Not difficult at all Not difficult at all - -   Interpretation of Total Score  Total Score Depression Severity:  1-4 = Minimal depression, 5-9 = Mild depression, 10-14 = Moderate depression, 15-19 = Moderately severe  depression, 20-27 = Severe depression   Psychosocial Evaluation and Intervention:  Psychosocial Evaluation - 09/05/23 1326       Psychosocial Evaluation & Interventions   Interventions Stress management education;Relaxation education;Encouraged to exercise with the program and follow exercise prescription    Comments Patient was referred to [facility] with stable angina. She has participated in our program pre Covid and joined our maintenance program. She was exercising at the [facility] but has had a lot of stressors in her life and she stopped exercising and then started having angina. She did have a heart cath and medical management was the treatment recommendation. She did not share what her stressors were but did say she was seeing a counselor routinely and he had her on an antidepressant but this has been discontinued and she thinks her last visit with him will be the last one. She says the stressful situations have improved. She denies any depression or anxiety. She says she sleeps well at night. She says she has a great support system with her boyfriend and her church family. She is very excited to be participating in the program again. Her main goals for the program are to lose weight; get back into an exercise routine and improve her angina symptoms. She has no barriers identified to participate in the program.    Expected Outcomes Short Term: Start the program and attend consistently. Long Term: Complete the program meeting her personal goals.    Continue Psychosocial Services  Follow up required by staff             Psychosocial Re-Evaluation:   Psychosocial Discharge (Final Psychosocial Re-Evaluation):   Vocational Rehabilitation: Provide vocational rehab assistance to qualifying candidates.   Vocational Rehab Evaluation & Intervention:  Vocational Rehab - 09/05/23 1325       Initial Vocational Rehab Evaluation & Intervention   Assessment shows need for Vocational Rehabilitation No       Vocational Rehab Re-Evaulation   Comments Patient is disabled/retired.             Education: Education Goals: Education classes will be provided on a weekly basis, covering required topics. Participant will state understanding/return demonstration of topics presented.  Learning Barriers/Preferences:  Learning Barriers/Preferences - 09/05/23 1312       Learning Barriers/Preferences   Learning Barriers None    Learning Preferences Audio;Computer/Internet;Group Instruction;Individual Instruction;Pictoral;Skilled Demonstration;Written Material             Education Topics: Hypertension, Hypertension Reduction -Define heart disease and high blood pressure. Discus how high blood pressure affects the body and ways to reduce high blood pressure. Flowsheet Row CARDIAC REHAB PHASE II EXERCISE from 09/25/2018 in [place] CARDIAC REHABILITATION  Date 09/04/18  Educator DJ  Instruction Review Code 2- Demonstrated Understanding       Exercise and Your Heart -Discuss why it is important to exercise, the FITT principles of exercise, normal and abnormal responses to exercise, and how to exercise safely. Flowsheet Row CARDIAC REHAB PHASE II EXERCISE from 09/25/2018 in [place] CARDIAC REHABILITATION  Date 09/11/18  Educator Laural Benes  Instruction  Review Code 2- Demonstrated Understanding       Angina -Discuss definition of angina, causes of angina, treatment of angina, and how to decrease risk of having angina. Flowsheet Row CARDIAC REHAB PHASE II EXERCISE from 09/25/2018 in [place] CARDIAC REHABILITATION  Date 09/18/18  Educator Laural Benes  Instruction Review Code 2- Demonstrated Understanding       Cardiac Medications -Review what the following cardiac medications are used for, how they affect the body, and side effects that may occur when taking the medications.  Medications include Aspirin, Beta blockers, calcium channel blockers, ACE Inhibitors, angiotensin receptor  blockers, diuretics, digoxin, and antihyperlipidemics. Flowsheet Row CARDIAC REHAB PHASE II EXERCISE from 09/25/2018 in [place] CARDIAC REHABILITATION  Date 06/26/18  Educator Timoteo Expose  Instruction Review Code 2- Demonstrated Understanding       Congestive Heart Failure -Discuss the definition of CHF, how to live with CHF, the signs and symptoms of CHF, and how keep track of weight and sodium intake. Flowsheet Row CARDIAC REHAB PHASE II EXERCISE from 09/25/2018 in [place] CARDIAC REHABILITATION  Date 07/03/18  Educator Laural Benes  Instruction Review Code 2- Demonstrated Understanding       Heart Disease and Intimacy -Discus the effect sexual activity has on the heart, how changes occur during intimacy as we age, and safety during sexual activity. Flowsheet Row CARDIAC REHAB PHASE II EXERCISE from 09/25/2018 in [place] CARDIAC REHABILITATION  Date 07/10/18  Educator Timoteo Expose  Instruction Review Code 2- Demonstrated Understanding       Smoking Cessation / COPD -Discuss different methods to quit smoking, the health benefits of quitting smoking, and the definition of COPD. Flowsheet Row CARDIAC REHAB PHASE II EXERCISE from 09/25/2018 in [place] CARDIAC REHABILITATION  Date 07/17/18  Educator Timoteo Expose   Instruction Review Code 2- Demonstrated Understanding       Nutrition I: Fats -Discuss the types of cholesterol, what cholesterol does to the heart, and how cholesterol levels can be controlled.   Nutrition II: Labels -Discuss the different components of food labels and how to read food label Flowsheet Row CARDIAC REHAB PHASE II EXERCISE from 09/25/2018 in [place] [facility] CARDIAC REHABILITATION  Date 08/07/18  Educator DC  Instruction Review Code 2- Demonstrated Understanding       Heart Parts/Heart Disease and PAD -Discuss the anatomy of the heart, the pathway of blood circulation through the heart, and these are affected by heart disease. Flowsheet Row CARDIAC  REHAB PHASE II EXERCISE from 09/25/2018 in [place] CARDIAC REHABILITATION  Date 08/02/18  Educator Laural Benes  Instruction Review Code 2- Demonstrated Understanding       Stress I: Signs and Symptoms -Discuss the causes of stress, how stress may lead to anxiety and depression, and ways to limit stress. Flowsheet Row CARDIAC REHAB PHASE II EXERCISE from 09/25/2018 in [place] CARDIAC REHABILITATION  Date 08/14/18  Educator DJ  Instruction Review Code 2- Demonstrated Understanding       Stress II: Relaxation -Discuss different types of relaxation techniques to limit stress.   Warning Signs of Stroke / TIA -Discuss definition of a stroke, what the signs and symptoms are of a stroke, and how to identify when someone is having stroke. Flowsheet Row CARDIAC REHAB PHASE II EXERCISE from 09/25/2018 in [place] CARDIAC REHABILITATION  Date 08/28/18  Educator DJ  Instruction Review Code 2- Demonstrated Understanding       Knowledge Questionnaire Score:   Core Components/Risk Factors/Patient Goals at Admission:  Personal Goals and Risk Factors  at Admission - 09/05/23 1325       Core Components/Risk Factors/Patient Goals on Admission    Weight Management Obesity    Improve shortness of breath with ADL's Yes    Intervention Provide education, individualized exercise plan and daily activity instruction to help decrease symptoms of SOB with activities of daily living.    Expected Outcomes Short Term: Improve cardiorespiratory fitness to achieve a reduction of symptoms when performing ADLs;Long Term: Be able to perform more ADLs without symptoms or delay the onset of symptoms    Diabetes Yes    Intervention Provide education about signs/symptoms and action to take for hypo/hyperglycemia.;Provide education about proper nutrition, including hydration, and aerobic/resistive exercise prescription along with prescribed medications to achieve blood glucose in normal ranges: Fasting glucose  65-99 mg/dL    Expected Outcomes Short Term: Participant verbalizes understanding of the signs/symptoms and immediate care of hyper/hypoglycemia, proper foot care and importance of medication, aerobic/resistive exercise and nutrition plan for blood glucose control.;Long Term: Attainment of HbA1C < 7%.    Heart Failure Yes    Intervention Provide a combined exercise and nutrition program that is supplemented with education, support and counseling about heart failure. Directed toward relieving symptoms such as shortness of breath, decreased exercise tolerance, and extremity edema.    Expected Outcomes Improve functional capacity of life;Short term: Attendance in program 2-3 days a week with increased exercise capacity. Reported lower sodium intake. Reported increased fruit and vegetable intake. Reports medication compliance.;Short term: Daily weights obtained and reported for increase. Utilizing diuretic protocols set by physician.;Long term: Adoption of self-care skills and reduction of barriers for early signs and symptoms recognition and intervention leading to self-care maintenance.    Hypertension Yes    Intervention Provide education on lifestyle modifcations including regular physical activity/exercise, weight management, moderate sodium restriction and increased consumption of fresh fruit, vegetables, and low fat dairy, alcohol moderation, and smoking cessation.;Monitor prescription use compliance.    Expected Outcomes Short Term: Continued assessment and intervention until BP is < 140/46mm HG in hypertensive participants. < 130/40mm HG in hypertensive participants with diabetes, heart failure or chronic kidney disease.;Long Term: Maintenance of blood pressure at goal levels.    Lipids Yes    Intervention Provide education and support for participant on nutrition & aerobic/resistive exercise along with prescribed medications to achieve LDL 70mg , HDL >40mg .    Expected Outcomes Short Term:  Participant states understanding of desired cholesterol values and is compliant with medications prescribed. Participant is following exercise prescription and nutrition guidelines.;Long Term: Cholesterol controlled with medications as prescribed, with individualized exercise RX and with personalized nutrition plan. Value goals: LDL < 70mg , HDL > 40 mg.             Core Components/Risk Factors/Patient Goals Review:    Core Components/Risk Factors/Patient Goals at Discharge (Final Review):    ITP Comments:  ITP Comments     Row Name 09/05/23 1337 09/10/23 0943         ITP Comments Virtual visit completed 09/05/23. Her orientation visit is scheduled for Monday 2/10 at 8am. Patient arrived for 1st visit/orientation/education at 0800. Patient was referred to CR by Dr. Dina Rich due to Stable Angina. During orientation advised patient on arrival and appointment times what to wear, what to do before, during and after exercise. Reviewed attendance and class policy.  Pt is scheduled to return Cardiac Rehab on 09/12/23 at 745. Pt was advised to come to class 15 minutes before class starts.  Discussed RPE/Dpysnea scales.  Patient participated in warm up stretches. Patient was able to complete 6 minute walk test.  She did report chest tightness 7/10 during walk test. It subsided to 2/10 after rest. She stated it was not bad enough to take a NTG. Telemetry:SR. Patient was measured for the equipment. Discussed equipment safety with patient. Took patient pre-anthropometric               Comments: Patient arrived for 1st visit/orientation/education at 0800. Patient was referred to CR by Dr. Dina Rich due to Stable Angina. During orientation advised patient on arrival and appointment times what to wear, what to do before, during and after exercise. Reviewed attendance and class policy.  Pt is scheduled to return Cardiac Rehab on 09/12/23 at 745. Pt was advised to come to class 15 minutes before  class starts.  Discussed RPE/Dpysnea scales. Patient participated in warm up stretches. Patient was able to complete 6 minute walk test.  She did report chest tightness 7/10 during walk test. It subsided to 2/10 after rest. She stated it was not bad enough to take a NTG. Telemetry:SR. Patient was measured for the equipment. Discussed equipment safety with patient. Took patient pre-anthropometric measurements. Patient finished visit at 0850.

## 2023-09-10 NOTE — Patient Instructions (Signed)
 Patient Instructions  Patient Details  Name: Jodi Ray MRN: [id number] Date of Birth: 11-29-1960 Referring Provider:  Laurann Pollock, MD  Below are your personal goals for exercise, nutrition, and risk factors. Our goal is to help you stay on track towards obtaining and maintaining these goals. We will be discussing your progress on these goals with you throughout the program.  Initial Exercise Prescription:  Initial Exercise Prescription - 09/10/23 0900       Date of Initial Exercise RX and Referring Provider   Date 09/10/23    Referring Provider Armida Lander MD      NuStep   Level 2    SPM 50    Minutes 15    METs 2      Recumbant Elliptical   Level 2    RPM 50    Minutes 15    METs 3      Prescription Details   Frequency (times per week) 3    Duration Progress to 30 minutes of continuous aerobic without signs/symptoms of physical distress      Intensity   THRR 40-80% of Max Heartrate 106-139    Ratings of Perceived Exertion 11-13    Perceived Dyspnea 0-4      Resistance Training   Training Prescription Yes    Weight 3    Reps 10-15             Exercise Goals: Frequency: Be able to perform aerobic exercise two to three times per week in program working toward 2-5 days per week of home exercise.  Intensity: Work with a perceived exertion of 11 (fairly light) - 15 (hard) while following your exercise prescription.  We will make changes to your prescription with you as you progress through the program.   Duration: Be able to do 30 to 45 minutes of continuous aerobic exercise in addition to a 5 minute warm-up and a 5 minute cool-down routine.   Nutrition Goals: Your personal nutrition goals will be established when you do your nutrition analysis with the dietician.  The following are general nutrition guidelines to follow: Cholesterol < 200mg /day Sodium < 1500mg /day Fiber: Women over 50 yrs - 21 grams per day  Personal Goals:  Personal  Goals and Risk Factors at Admission - 09/05/23 1325       Core Components/Risk Factors/Patient Goals on Admission    Weight Management Obesity    Improve shortness of breath with ADL's Yes    Intervention Provide education, individualized exercise plan and daily activity instruction to help decrease symptoms of SOB with activities of daily living.    Expected Outcomes Short Term: Improve cardiorespiratory fitness to achieve a reduction of symptoms when performing ADLs;Long Term: Be able to perform more ADLs without symptoms or delay the onset of symptoms    Diabetes Yes    Intervention Provide education about signs/symptoms and action to take for hypo/hyperglycemia.;Provide education about proper nutrition, including hydration, and aerobic/resistive exercise prescription along with prescribed medications to achieve blood glucose in normal ranges: Fasting glucose 65-99 mg/dL    Expected Outcomes Short Term: Participant verbalizes understanding of the signs/symptoms and immediate care of hyper/hypoglycemia, proper foot care and importance of medication, aerobic/resistive exercise and nutrition plan for blood glucose control.;Long Term: Attainment of HbA1C < 7%.    Heart Failure Yes    Intervention Provide a combined exercise and nutrition program that is supplemented with education, support and counseling about heart failure. Directed toward relieving symptoms such as shortness of breath,  decreased exercise tolerance, and extremity edema.    Expected Outcomes Improve functional capacity of life;Short term: Attendance in program 2-3 days a week with increased exercise capacity. Reported lower sodium intake. Reported increased fruit and vegetable intake. Reports medication compliance.;Short term: Daily weights obtained and reported for increase. Utilizing diuretic protocols set by physician.;Long term: Adoption of self-care skills and reduction of barriers for early signs and symptoms recognition and  intervention leading to self-care maintenance.    Hypertension Yes    Intervention Provide education on lifestyle modifcations including regular physical activity/exercise, weight management, moderate sodium restriction and increased consumption of fresh fruit, vegetables, and low fat dairy, alcohol  moderation, and smoking cessation.;Monitor prescription use compliance.    Expected Outcomes Short Term: Continued assessment and intervention until BP is < 140/63mm HG in hypertensive participants. < 130/62mm HG in hypertensive participants with diabetes, heart failure or chronic kidney disease.;Long Term: Maintenance of blood pressure at goal levels.    Lipids Yes    Intervention Provide education and support for participant on nutrition & aerobic/resistive exercise along with prescribed medications to achieve LDL 70mg , HDL >40mg .    Expected Outcomes Short Term: Participant states understanding of desired cholesterol values and is compliant with medications prescribed. Participant is following exercise prescription and nutrition guidelines.;Long Term: Cholesterol controlled with medications as prescribed, with individualized exercise RX and with personalized nutrition plan. Value goals: LDL < 70mg , HDL > 40 mg.             Tobacco Use Initial Evaluation: Social History   Tobacco Use  Smoking Status Never  Smokeless Tobacco Never    Exercise Goals and Review:  Exercise Goals     Row Name 09/10/23 0948             Exercise Goals   Increase Physical Activity Yes       Intervention Provide advice, education, support and counseling about physical activity/exercise needs.;Develop an individualized exercise prescription for aerobic and resistive training based on initial evaluation findings, risk stratification, comorbidities and participant's personal goals.       Expected Outcomes Short Term: Attend rehab on a regular basis to increase amount of physical activity.;Long Term: Add in home  exercise to make exercise part of routine and to increase amount of physical activity.;Long Term: Exercising regularly at least 3-5 days a week.       Increase Strength and Stamina Yes       Intervention Provide advice, education, support and counseling about physical activity/exercise needs.;Develop an individualized exercise prescription for aerobic and resistive training based on initial evaluation findings, risk stratification, comorbidities and participant's personal goals.       Expected Outcomes Short Term: Increase workloads from initial exercise prescription for resistance, speed, and METs.;Short Term: Perform resistance training exercises routinely during rehab and add in resistance training at home;Long Term: Improve cardiorespiratory fitness, muscular endurance and strength as measured by increased METs and functional capacity ( )       Able to understand and use rate of perceived exertion (RPE) scale Yes       Intervention Provide education and explanation on how to use RPE scale       Expected Outcomes Short Term: Able to use RPE daily in rehab to express subjective intensity level;Long Term:  Able to use RPE to guide intensity level when exercising independently       Able to understand and use Dyspnea scale Yes       Intervention Provide education and explanation on how to  use Dyspnea scale       Expected Outcomes Short Term: Able to use Dyspnea scale daily in rehab to express subjective sense of shortness of breath during exertion;Long Term: Able to use Dyspnea scale to guide intensity level when exercising independently       Knowledge and understanding of Target Heart Rate Range (THRR) Yes       Intervention Provide education and explanation of THRR including how the numbers were predicted and where they are located for reference       Expected Outcomes Short Term: Able to state/look up THRR;Long Term: Able to use THRR to govern intensity when exercising independently;Short Term:  Able to use daily as guideline for intensity in rehab       Able to check pulse independently Yes       Intervention Provide education and demonstration on how to check pulse in carotid and radial arteries.;Review the importance of being able to check your own pulse for safety during independent exercise       Expected Outcomes Long Term: Able to check pulse independently and accurately;Short Term: Able to explain why pulse checking is important during independent exercise       Understanding of Exercise Prescription Yes       Intervention Provide education, explanation, and written materials on patient's individual exercise prescription       Expected Outcomes Short Term: Able to explain program exercise prescription;Long Term: Able to explain home exercise prescription to exercise independently                Copy of goals given to participant.

## 2023-09-12 ENCOUNTER — Encounter (HOSPITAL_COMMUNITY): Payer: Medicare Other

## 2023-09-13 DIAGNOSIS — F411 Generalized anxiety disorder: Secondary | ICD-10-CM | POA: Diagnosis not present

## 2023-09-13 DIAGNOSIS — F330 Major depressive disorder, recurrent, mild: Secondary | ICD-10-CM | POA: Diagnosis not present

## 2023-09-13 NOTE — Progress Notes (Signed)
 [facility] - Clinic Note  09/24/2023     CHIEF COMPLAINT Patient presents for Retina Follow Up   HISTORY OF PRESENT ILLNESS: Jodi Ray is a 63 y.o. female who presents to the clinic today for:   HPI     Retina Follow Up   Patient presents with  Diabetic Retinopathy.  In both eyes.  Severity is moderate.  Duration of 6 weeks.  Since onset it is stable.  I, the attending physician,  performed the HPI with the patient and updated documentation appropriately.        Comments   Patient states the vision is getting better. She is not using eye drops. She feels the left eye is constantly sore. Her blood sugar was 78.      Last edited by Rennis Chris, MD on 09/24/2023  9:40 AM.     Pt states her vision is a little "funky" this morning bc her sugar has been low, she states her left eye has been sore, she states her blood sugar was 78 this morning when trying to read the eye chart and she had a hard time focusing  Referring physician: Kirstie Peri, MD [address] [place],  [place] [address]  HISTORICAL INFORMATION:   Selected notes from the MEDICAL RECORD NUMBER Referred by Dr. [name] for concern of DME LEE:  Ocular Hx- PMH-    CURRENT MEDICATIONS: No current outpatient medications on file. (Ophthalmic Drugs)   No current facility-administered medications for this visit. (Ophthalmic Drugs)   Current Outpatient Medications (Other)  Medication Sig   aspirin EC 81 MG tablet Take 1 tablet (81 mg total) by mouth daily. Swallow whole.   atorvastatin (LIPITOR) 80 MG tablet TAKE 1 TABLET BY MOUTH ONCE DAILY AT  6PM   calcitRIOL (ROCALTROL) 0.25 MCG capsule Take 0.25 mcg by mouth every Monday, Wednesday, and Friday.   carvedilol (COREG) 6.25 MG tablet Take 1 tablet (6.25 mg total) by mouth 2 (two) times daily.   Continuous Blood Gluc Sensor (DEXCOM G6 SENSOR) MISC 1 Device by Other route as directed.   EPINEPHRINE 0.3 mg/0.3 mL IJ SOAJ injection INJECT  CONTENTS OF ONE PEN INTO THE MUSCLE AS NEEDED FOR ANAPHYLAXIS   furosemide (LASIX) 20 MG tablet Take 20-40 mg by mouth See admin instructions. Take 2 tablets in the morning and 2 tablet in the evening   Insulin Human (INSULIN PUMP) SOLN Inject into the skin continuous.    Insulin Pen Needle (PEN NEEDLES 31GX5/16") 31G X 8 MM MISC    JARDIANCE 10 MG TABS tablet Take 5 mg by mouth every Monday, Wednesday, and Friday.   KERENDIA 10 MG TABS Take 10 mg by mouth every Monday, Wednesday, and Friday.   Lactulose 20 GM/30ML SOLN Take 15 mLs (10 g total) by mouth 2 (two) times daily.   levothyroxine (SYNTHROID) 112 MCG tablet Take 112 mcg by mouth daily before breakfast.   lubiprostone (AMITIZA) 24 MCG capsule Take 1 capsule (24 mcg total) by mouth 2 (two) times daily with a meal.   metFORMIN (GLUCOPHAGE) 500 MG tablet Take 1 tablet (500 mg total) by mouth 2 (two) times daily with a meal.   nitroGLYCERIN (NITROSTAT) 0.4 MG SL tablet Place 1 tablet (0.4 mg total) under the tongue every 5 (five) minutes as needed for chest pain.   NOVOLOG 100 UNIT/ML injection Inject into the skin continuous. In insulin pump   pantoprazole (PROTONIX) 40 MG tablet Take 1 tablet (40 mg total) by mouth  2 (two) times daily before a meal. (Patient taking differently: Take 40 mg by mouth daily.)   ranolazine (RANEXA) 500 MG 12 hr tablet Take 1 tablet (500 mg total) by mouth 2 (two) times daily.   verapamil (CALAN) 80 MG tablet Take 1 tablet (80 mg total) by mouth 3 (three) times daily.   hydrALAZINE (APRESOLINE) 25 MG tablet Take 1 tablet (25 mg total) by mouth 3 (three) times daily.   Omega-3 Fatty Acids (FISH OIL) 1200 MG CAPS Take 1,200 mg by mouth daily.   No current facility-administered medications for this visit. (Other)   REVIEW OF SYSTEMS: ROS   Positive for: Endocrine, Cardiovascular, Eyes Negative for: Constitutional, Gastrointestinal, Neurological, Skin, Genitourinary, Musculoskeletal, HENT, Respiratory,  Psychiatric, Allergic/Imm, Heme/Lymph Last edited by Charlette Caffey, COT on 09/24/2023  7:32 AM.      ALLERGIES Allergies  Allergen Reactions   Other Nausea And Vomiting and Other (See Comments)    Opiates Alters mental status diarrhea   Fentanyl Nausea And Vomiting and Other (See Comments)    Nausea, vomiting, loss of consciousness, requiring reversal   PAST MEDICAL HISTORY Past Medical History:  Diagnosis Date   Adult RDS (HCC)    Anemia    Brain aneurysm    frontal lobe   CAD (coronary artery disease)    a. 02/2018: s/p DES to Proximal LAD with residual 20% RCA stenosis.   Chronic pain    Diabetes mellitus without complication (HCC)    Headache    History of left bundle branch block (LBBB)    HTN (hypertension)    Hx of cardiovascular stress test 10/2016   intermediate risk study   Hypothyroidism    Neuromuscular disorder (HCC)    Neuropathy    Obesity    OSA on CPAP 03/10/2015   Renal insufficiency    Retinopathy    Sleep apnea    Varicose veins of both lower extremities    Past Surgical History:  Procedure Laterality Date   BIOPSY  11/14/2016   Procedure: BIOPSY;  Surgeon: West Bali, MD;  [facility]: [facility];  Service: Endoscopy;;  colon gastric duodenal   BIOPSY  03/19/2023   Procedure: BIOPSY;  Surgeon: Lanelle Bal, DO;  [facility]: [facility];  Service: Endoscopy;;   BREAST EXCISIONAL BIOPSY Right    BREAST SURGERY Left 1994   Lumpectomy   COLONOSCOPY WITH PROPOFOL N/A 11/14/2016   Dr. Darrick Penna: Moderately redundant rectosigmoid colon. Random colon biopsies benign. Internal hemorrhoids. Surveillance colonoscopy in 5 years.   COLONOSCOPY WITH PROPOFOL N/A 04/20/2022   Procedure: COLONOSCOPY WITH PROPOFOL;  Surgeon: Lanelle Bal, DO;  [facility]: [facility];  Service: Endoscopy;  Laterality: N/A;  12:30am   CORONARY PRESSURE/FFR STUDY N/A 06/19/2023   Procedure: CORONARY PRESSURE/FFR STUDY;  Surgeon: Orbie Pyo, MD;  [facility];  Service: Cardiovascular;  Laterality: N/A;   CORONARY STENT INTERVENTION N/A 03/22/2018   Procedure: CORONARY STENT INTERVENTION;  Surgeon: Kathleene Hazel, MD;  [facility]: [facility];  Service: Cardiovascular;  Laterality: N/A;   ESOPHAGOGASTRODUODENOSCOPY (EGD) WITH PROPOFOL N/A 11/14/2016   Dr. Darrick Penna: Esophagus normal. Moderate gastritis, few gastric polyps. Duodenal biopsies negative. Fundic gland polyp gastric polyp area no H pylori.   ESOPHAGOGASTRODUODENOSCOPY (EGD) WITH PROPOFOL N/A 03/19/2023   Procedure: ESOPHAGOGASTRODUODENOSCOPY (EGD) WITH PROPOFOL;  Surgeon: Lanelle Bal, DO;  [facility];  Service: Endoscopy;  Laterality: N/A;  10:15 am, asa 3   FOOT SURGERY  LEFT HEART CATH AND CORONARY ANGIOGRAPHY N/A 03/22/2018   Procedure: LEFT HEART CATH AND CORONARY ANGIOGRAPHY;  Surgeon: Kathleene Hazel, MD;  [facility];  Service: Cardiovascular;  Laterality: N/A;   LEFT HEART CATH AND CORONARY ANGIOGRAPHY N/A 06/19/2023   Procedure: LEFT HEART CATH AND CORONARY ANGIOGRAPHY;  Surgeon: Orbie Pyo, MD;  [facility];  Service: Cardiovascular;  Laterality: N/A;   LEG SURGERY Right    POLYPECTOMY  04/20/2022   Procedure: POLYPECTOMY;  Surgeon: Lanelle Bal, DO;  [facility];  Service: Endoscopy;;   REPLACEMENT TOTAL KNEE Right    SPINAL CORD STIMULATOR IMPLANT     FAMILY HISTORY Family History  Problem Relation Age of Onset   Cancer Mother 5       colon   Cancer Father        renal cell   Colon cancer Father    Breast cancer Sister    Cancer Sister        primary brain, and primary breast too   Breast cancer Sister    Cancer Sister        breast    Breast cancer Sister    Breast cancer Sister    Cancer Brother        kidney and liver   Sleep apnea Neg Hx    SOCIAL HISTORY Social History   Tobacco Use   Smoking status: Never   Smokeless tobacco: Never   Vaping Use   Vaping status: Never Used  Substance Use Topics   Alcohol use: No    Alcohol/week: 0.0 standard drinks of alcohol   Drug use: No       OPHTHALMIC EXAM:  Base Eye Exam     Visual Acuity (Snellen - Linear)       Right Left   Dist cc 20/25 20/30   Dist ph cc NI NI         Tonometry (Tonopen, 7:35 AM)       Right Left   Pressure 14 12         Pupils       Dark Light Shape React APD   Right 3 2 Round Brisk None   Left 3 2 Round Brisk None         Visual Fields       Left Right    Full Full         Extraocular Movement       Right Left    Full, Ortho Full, Ortho         Neuro/Psych     Oriented x3: Yes   Mood/Affect: Normal         Dilation     Both eyes: 1.0% Mydriacyl, 2.5% Phenylephrine @ 7:33 AM           Slit Lamp and Fundus Exam     Slit Lamp Exam       Right Left   Lids/Lashes Dermatochalasis - upper lid, mild MGD Dermatochalasis - upper lid, mild MGD   Conjunctiva/Sclera nasal pingeucula nasal pingeucula, Trace Injection   Cornea arcus, mild tear film debris arcus, trace tear film debris   Anterior Chamber deep and clear, no cell or flare deep and clear, no cell or flare   Iris Round and dilated, No NVI Round and dilated, No NVI   Lens 2-3+ Nuclear sclerosis, 2-3+ Cortical cataract, 1+ Posterior subcapsular cataract 2-3+ Nuclear sclerosis, 2-3+ Cortical cataract, 1+ Posterior subcapsular  cataract   Anterior Vitreous Vitreous syneresis Vitreous syneresis         Fundus Exam       Right Left   Disc Pink and Sharp, no NVD, temporal PPA Pink and Sharp, no NVD, temporal PPP   C/D Ratio 0.4 0.4   Macula Good foveal reflex, +cystic change -- improved, scattered MA -- improved Flat, good foveal reflex, scattered MA -- improved, trace cystic changes   Vessels attenuated, Tortuous attenuated, Tortuous, +NV -- regressing   Periphery Attached, dense 360 PRP extending posteriorly to arcades Attached, 360 PRP with good  posterior fill in, room for fill in if needed           Refraction     Wearing Rx       Sphere Cylinder Axis Add   Right +1.75 +1.50 124 +2.50   Left +4.25 +2.25 001 +2.50           IMAGING AND PROCEDURES  Imaging and Procedures for 09/24/2023  OCT, Retina - OU - Both Eyes       Right Eye Quality was good. Central Foveal Thickness: 316. Progression has been stable. Findings include normal foveal contour, no IRF, no SRF (Stable resolution of cystic changes centrally, mild persistent cystic changes inferior macula).   Left Eye Quality was good. Central Foveal Thickness: 296. Progression has been stable. Findings include normal foveal contour, no SRF, epiretinal membrane, intraretinal fluid, macular pucker (Persistent, trace cystic changes greatest nasal macula; mild ERM and pucker nasal macula).   Notes *Images captured and stored on drive  Diagnosis / Impression:  +DME OU OD: Stable resolution of cystic changes centrally, mild persistent cystic changes inferior macula OS: Persistent, trace cystic changes greatest nasal macula; mild ERM and pucker nasal macula  Clinical management:  See below  Abbreviations: NFP - Normal foveal profile. CME - cystoid macular edema. PED - pigment epithelial detachment. IRF - intraretinal fluid. SRF - subretinal fluid. EZ - ellipsoid zone. ERM - epiretinal membrane. ORA - outer retinal atrophy. ORT - outer retinal tubulation. SRHM - subretinal hyper-reflective material. IRHM - intraretinal hyper-reflective material      Intravitreal Injection, Pharmacologic Agent - OD - Right Eye       Time Out 09/24/2023. 8:30 AM. Confirmed correct patient, procedure, site, and patient consented.   Anesthesia Topical anesthesia was used. Anesthetic medications included Lidocaine 2%, Proparacaine 0.5%.   Procedure Preparation included 5% betadine to ocular surface, eyelid speculum. A (32g) needle was used.   Injection: 2 mg aflibercept 2  MG/0.05ML   Route: Intravitreal, Site: Right Eye   NDC: [id number], Lot: [id number], Expiration date: 08/30/2024, Waste: 0 mL   Post-op Post injection exam found visual acuity of at least counting fingers. The patient tolerated the procedure well. There were no complications. The patient received written and verbal post procedure care education.             ASSESSMENT/PLAN:    ICD-10-CM   1. Proliferative diabetic retinopathy of both eyes with macular edema associated with type 2 diabetes mellitus (HCC)  E11.3513 OCT, Retina - OU - Both Eyes    Intravitreal Injection, Pharmacologic Agent - OD - Right Eye    aflibercept (EYLEA) SOLN 2 mg    CANCELED: Intravitreal Injection, Pharmacologic Agent - OS - Left Eye    2. Long term (current) use of oral hypoglycemic drugs  Z79.84     3. Long-term (current) use of injectable non-insulin antidiabetic drugs  Z79.85     4.  Current use of insulin (HCC)  Z79.4     5. Essential hypertension  I10     6. Hypertensive retinopathy of both eyes  H35.033     7. Combined forms of age-related cataract of both eyes  H25.813      1-4. Proliferative diabetic retinopathy OU - previously managed by Dr. Cathey Endow -- lost to f/u since 2020 - s/p PRP OU and IVA OS x7 (BB) - s/p IVA OD #1 (01.02.24), #2 (02.02.24), #3 (03.01.24), #4(04.05.24), #5 (10.21.24) -- IVA resistance - s/p IVA OS #1 (01.04.24), #2 (02.02.24), #3 (03.01.24), #4(04.05.24), #5 (05.03.24), #6 (06.03.24), #7 (07.01.24), #8 (08.05.24), #9 (09.09.24) - IVA resistance ============================================================= - s/p IVE OD #1 (05.03.24), #2 (06.03.24), #3 (07.01.24), #4 (08.05.24), #5 (09.09.24), #6 (12.10.24), #7 (01.13.25) - s/p IVE OS #1 (10.21.24), #2 (12.10.24) - s/p PRP fill in OS (01.23.24) - exam shows scattered MA OU; laser PRP OU - FA (01.02.23) shows OD: Cluster of leakage temporal macula, no NV; OS: Scattered focal NVE greatest inferior and temporal quads --  will need PRP fill in OS -- completed Jan 2024 - BCVA 20/25 OD, 20/30 OS - OCT shows OD: Stable resolution of cystic changes centrally, mild persistent cystic changes inferior macula; OS: Persistent, trace cystic changes greatest nasal macula; mild ERM and pucker nasal macula at 6 weeks - recommend IVE OD #8 today, 02.24.25 w/ f/u in 6 wks - pt wishes to hold off on tx in OS today -- okay - pt wishes to proceed with IVE OD  - RBA of procedure discussed, questions answered - see procedure note - IVE informed consent obtained and signed, 05.03.24 (OU) - f/u 6 weeks -- DFE, OCT, possible injxns  5,6. Hypertensive retinopathy OU - discussed importance of tight BP control  - monitor  7. Mixed Cataract OU - The symptoms of cataract, surgical options, and treatments and risks were discussed with patient. - discussed diagnosis and progression - monitor  Ophthalmic Meds Ordered this visit:  Meds ordered this encounter  Medications   aflibercept (EYLEA) SOLN 2 mg     Return in about 6 weeks (around 11/05/2023) for f/u PDR OU, DFE, OCT, Possible Injxn.  There are no Patient Instructions on file for this visit.  This document serves as a record of services personally performed by Karie Chimera, MD, PhD. It was created on their behalf by Glee Arvin. Manson Passey, OA an ophthalmic technician. The creation of this record is the provider's dictation and/or activities during the visit.    Electronically signed by: Glee Arvin. Manson Passey, OA 09/24/23 9:43 AM  Karie Chimera, M.D., Ph.D. Diseases & Surgery of the Retina and Vitreous [facility] 09/24/2023   I have reviewed the above documentation for accuracy and completeness, and I agree with the above. Karie Chimera, M.D., Ph.D. 09/24/23 9:44 AM   Abbreviations: M myopia (nearsighted); A astigmatism; H hyperopia (farsighted); P presbyopia; Mrx spectacle prescription;  CTL contact lenses; OD right eye; OS left eye; OU both eyes  XT  exotropia; ET esotropia; PEK punctate epithelial keratitis; PEE punctate epithelial erosions; DES dry eye syndrome; MGD meibomian gland dysfunction; ATs artificial tears; PFAT's preservative free artificial tears; NSC nuclear sclerotic cataract; PSC posterior subcapsular cataract; ERM epi-retinal membrane; PVD posterior vitreous detachment; RD retinal detachment; DM diabetes mellitus; DR diabetic retinopathy; NPDR non-proliferative diabetic retinopathy; PDR proliferative diabetic retinopathy; CSME clinically significant macular edema; DME diabetic macular edema; dbh dot blot hemorrhages; CWS cotton wool spot; POAG primary open  angle glaucoma; C/D cup-to-disc ratio; HVF humphrey visual field; GVF goldmann visual field; OCT optical coherence tomography; IOP intraocular pressure; BRVO Branch retinal vein occlusion; CRVO central retinal vein occlusion; CRAO central retinal artery occlusion; BRAO branch retinal artery occlusion; RT retinal tear; SB scleral buckle; PPV pars plana vitrectomy; VH Vitreous hemorrhage; PRP panretinal laser photocoagulation; IVK intravitreal kenalog; VMT vitreomacular traction; MH Macular hole;  NVD neovascularization of the disc; NVE neovascularization elsewhere; AREDS age related eye disease study; ARMD age related macular degeneration; POAG primary open angle glaucoma; EBMD epithelial/anterior basement membrane dystrophy; ACIOL anterior chamber intraocular lens; IOL intraocular lens; PCIOL posterior chamber intraocular lens; Phaco/IOL phacoemulsification with intraocular lens placement; PRK photorefractive keratectomy; LASIK laser assisted in situ keratomileusis; HTN hypertension; DM diabetes mellitus; COPD chronic obstructive pulmonary disease

## 2023-09-14 ENCOUNTER — Encounter (HOSPITAL_COMMUNITY)
Admission: RE | Admit: 2023-09-14 | Discharge: 2023-09-14 | Disposition: A | Discharge status: Home / Self Care | Payer: Medicare Other | Source: Ambulatory Visit | Attending: Cardiology | Admitting: Cardiology

## 2023-09-14 ENCOUNTER — Ambulatory Visit (INDEPENDENT_AMBULATORY_CARE_PROVIDER_SITE_OTHER): Payer: Medicare Other | Admitting: *Deleted

## 2023-09-14 DIAGNOSIS — J309 Allergic rhinitis, unspecified: Secondary | ICD-10-CM | POA: Diagnosis not present

## 2023-09-14 DIAGNOSIS — I2089 Other forms of angina pectoris: Secondary | ICD-10-CM | POA: Diagnosis not present

## 2023-09-14 LAB — GLUCOSE, CAPILLARY
Glucose-Capillary: 138 mg/dL — ABNORMAL HIGH (ref 70–99)
Glucose-Capillary: 126 mg/dL — ABNORMAL HIGH (ref 70–99)

## 2023-09-14 NOTE — Progress Notes (Signed)
Daily Session Note  Patient Details  Name: Jodi Ray MRN: [id number] Date of Birth: 11/16/1960 Referring Provider:   Flowsheet Row CARDIAC REHAB PHASE II ORIENTATION from 09/10/2023 in [facility] CARDIAC REHABILITATION  Referring Provider Dina Rich MD       Encounter Date: 09/14/2023  Check In:  Session Check In - 09/14/23 0745       Check-In   Supervising physician immediately available to respond to emergencies See telemetry face sheet for immediately available ER MD    Location [facility]    Staff Present Terrance Mass, RN;Jessica Juanetta Gosling, MA, RCEP, CCRP, CCET    Virtual Visit No    Medication changes reported     No    Fall or balance concerns reported    No    Warm-up and Cool-down Performed on first and last piece of equipment    Resistance Training Performed Yes    VAD Patient? No    PAD/SET Patient? No      Pain Assessment   Currently in Pain? No/denies    Multiple Pain Sites No             Capillary Blood Glucose: Results for orders placed or performed during the hospital encounter of 09/10/23 (from the past 24 hours)  Glucose, capillary     Status: Abnormal   Collection Time: 09/14/23  7:53 AM  Result Value Ref Range   Glucose-Capillary 138 (H) 70 - 99 mg/dL      Social History   Tobacco Use  Smoking Status Never  Smokeless Tobacco Never    Goals Met:  Independence with exercise equipment Exercise tolerated well Personal goals reviewed No report of concerns or symptoms today Strength training completed today  Goals Unmet:  Not Applicable  Comments:  Pt able to follow exercise prescription today without complaint.  Will continue to monitor for progression.

## 2023-09-17 ENCOUNTER — Encounter (HOSPITAL_COMMUNITY): Payer: Medicare Other

## 2023-09-18 ENCOUNTER — Encounter (HOSPITAL_COMMUNITY)
Admission: RE | Admit: 2023-09-18 | Discharge: 2023-09-18 | Disposition: A | Discharge status: Home / Self Care | Payer: Medicare Other | Source: Ambulatory Visit | Attending: Cardiology | Admitting: Cardiology

## 2023-09-18 DIAGNOSIS — I2089 Other forms of angina pectoris: Secondary | ICD-10-CM | POA: Diagnosis not present

## 2023-09-18 DIAGNOSIS — E1140 Type 2 diabetes mellitus with diabetic neuropathy, unspecified: Secondary | ICD-10-CM | POA: Diagnosis not present

## 2023-09-18 DIAGNOSIS — E1151 Type 2 diabetes mellitus with diabetic peripheral angiopathy without gangrene: Secondary | ICD-10-CM | POA: Diagnosis not present

## 2023-09-18 LAB — GLUCOSE, CAPILLARY
Glucose-Capillary: 100 mg/dL — ABNORMAL HIGH (ref 70–99)
Glucose-Capillary: 112 mg/dL — ABNORMAL HIGH (ref 70–99)
Glucose-Capillary: 65 mg/dL — ABNORMAL LOW (ref 70–99)
Glucose-Capillary: 86 mg/dL (ref 70–99)

## 2023-09-18 NOTE — Progress Notes (Signed)
Daily Session Note  Patient Details  Name: TRACE CEDERBERG MRN: [id number] Date of Birth: Nov 02, 1960 Referring Provider:   Flowsheet Row CARDIAC REHAB PHASE II ORIENTATION from 09/10/2023 in [facility] CARDIAC REHABILITATION  Referring Provider Dina Rich MD       Encounter Date: 09/18/2023  Check In:  Session Check In - 09/18/23 1500       Check-In   Supervising physician immediately available to respond to emergencies See telemetry face sheet for immediately available MD    Location [facility]    Staff Present Ross Ludwig, BS, Exercise Physiologist;Jessica Juanetta Gosling, MA, RCEP, CCRP, CCET;Zea Kostka, RN;Brittany Foley, BSN, RN    Virtual Visit No    Medication changes reported     No    Fall or balance concerns reported    No    Warm-up and Cool-down Performed on first and last piece of equipment    Resistance Training Performed Yes    VAD Patient? No    PAD/SET Patient? No      Pain Assessment   Currently in Pain? No/denies    Multiple Pain Sites No             Capillary Blood Glucose: Results for orders placed or performed during the hospital encounter of 09/18/23 (from the past 24 hours)  Glucose, capillary     Status: Abnormal   Collection Time: 09/18/23  2:52 PM  Result Value Ref Range   Glucose-Capillary 100 (H) 70 - 99 mg/dL      Social History   Tobacco Use  Smoking Status Never  Smokeless Tobacco Never    Goals Met:  Independence with exercise equipment Exercise tolerated well No report of concerns or symptoms today Strength training completed today  Goals Unmet:  Not Applicable  Comments: Pt able to follow exercise prescription today without complaint.  Will continue to monitor for progression.

## 2023-09-19 ENCOUNTER — Ambulatory Visit (INDEPENDENT_AMBULATORY_CARE_PROVIDER_SITE_OTHER): Payer: Self-pay

## 2023-09-19 ENCOUNTER — Encounter (HOSPITAL_COMMUNITY)
Admission: RE | Admit: 2023-09-19 | Discharge: 2023-09-19 | Disposition: A | Discharge status: Home / Self Care | Payer: Medicare Other | Source: Ambulatory Visit | Attending: Cardiology | Admitting: Cardiology

## 2023-09-19 DIAGNOSIS — I2089 Other forms of angina pectoris: Secondary | ICD-10-CM

## 2023-09-19 DIAGNOSIS — J309 Allergic rhinitis, unspecified: Secondary | ICD-10-CM | POA: Diagnosis not present

## 2023-09-19 LAB — GLUCOSE, CAPILLARY
Glucose-Capillary: 93 mg/dL (ref 70–99)
Glucose-Capillary: 77 mg/dL (ref 70–99)

## 2023-09-19 NOTE — Progress Notes (Signed)
Daily Session Note  Patient Details  Name: Jodi Ray MRN: [id number] Date of Birth: 03/22/1961 Referring Provider:   Flowsheet Row CARDIAC REHAB PHASE II ORIENTATION from 09/10/2023 in [facility] CARDIAC REHABILITATION  Referring Provider Dina Rich MD       Encounter Date: 09/19/2023  Check In:  Session Check In - 09/19/23 0745       Check-In   Supervising physician immediately available to respond to emergencies See telemetry face sheet for immediately available MD    Location [facility]    Staff Present Ross Ludwig, BS, Exercise Physiologist;Jessica Juanetta Gosling, MA, RCEP, CCRP, CCET    Virtual Visit No    Medication changes reported     No    Fall or balance concerns reported    No    Tobacco Cessation No Change    Warm-up and Cool-down Performed on first and last piece of equipment    Resistance Training Performed Yes    VAD Patient? No    PAD/SET Patient? No      Pain Assessment   Currently in Pain? No/denies    Multiple Pain Sites No             Capillary Blood Glucose: Results for orders placed or performed during the hospital encounter of 09/18/23 (from the past 24 hours)  Glucose, capillary     Status: Abnormal   Collection Time: 09/18/23  2:52 PM  Result Value Ref Range   Glucose-Capillary 100 (H) 70 - 99 mg/dL  Glucose, capillary     Status: Abnormal   Collection Time: 09/18/23  3:01 PM  Result Value Ref Range   Glucose-Capillary 112 (H) 70 - 99 mg/dL  Glucose, capillary     Status: Abnormal   Collection Time: 09/18/23  3:49 PM  Result Value Ref Range   Glucose-Capillary 65 (L) 70 - 99 mg/dL  Glucose, capillary     Status: None   Collection Time: 09/18/23  3:57 PM  Result Value Ref Range   Glucose-Capillary 86 70 - 99 mg/dL  Glucose, capillary     Status: None   Collection Time: 09/19/23  7:49 AM  Result Value Ref Range   Glucose-Capillary 93 70 - 99 mg/dL      Social History   Tobacco Use  Smoking Status  Never  Smokeless Tobacco Never    Goals Met:  Independence with exercise equipment Exercise tolerated well No report of concerns or symptoms today Strength training completed today  Goals Unmet:  Not Applicable  Comments: Pt able to follow exercise prescription today without complaint.  Will continue to monitor for progression.

## 2023-09-21 ENCOUNTER — Encounter (HOSPITAL_COMMUNITY)
Admission: RE | Admit: 2023-09-21 | Discharge: 2023-09-21 | Disposition: A | Discharge status: Home / Self Care | Payer: Medicare Other | Source: Ambulatory Visit | Attending: Cardiology | Admitting: Cardiology

## 2023-09-21 DIAGNOSIS — I2089 Other forms of angina pectoris: Secondary | ICD-10-CM

## 2023-09-21 NOTE — Progress Notes (Signed)
Daily Session Note  Patient Details  Name: ANNEKA STUDER MRN: [id number] Date of Birth: 07-18-1961 Referring Provider:   Flowsheet Row CARDIAC REHAB PHASE II ORIENTATION from 09/10/2023 in [facility] CARDIAC REHABILITATION  Referring Provider Dina Rich MD       Encounter Date: 09/21/2023  Check In:  Session Check In - 09/21/23 0805       Check-In   Supervising physician immediately available to respond to emergencies See telemetry face sheet for immediately available MD    Location [facility]    Staff Present Ross Ludwig, BS, Exercise Physiologist;Allissa Albright Juanetta Gosling, MA, RCEP, CCRP, Dow Adolph, RN, BSN    Virtual Visit No    Medication changes reported     No    Fall or balance concerns reported    No    Warm-up and Cool-down Performed on first and last piece of equipment    Resistance Training Performed Yes    VAD Patient? No    PAD/SET Patient? No      Pain Assessment   Currently in Pain? No/denies             Capillary Blood Glucose: No results found for this or any previous visit (from the past 24 hours).    Social History   Tobacco Use  Smoking Status Never  Smokeless Tobacco Never    Goals Met:  Independence with exercise equipment Exercise tolerated well No report of concerns or symptoms today Strength training completed today  Goals Unmet:  Not Applicable  Comments: Pt able to follow exercise prescription today without complaint.  Will continue to monitor for progression.

## 2023-09-24 ENCOUNTER — Encounter (HOSPITAL_COMMUNITY): Payer: Medicare Other

## 2023-09-24 ENCOUNTER — Encounter (INDEPENDENT_AMBULATORY_CARE_PROVIDER_SITE_OTHER): Payer: Self-pay | Admitting: Ophthalmology

## 2023-09-24 ENCOUNTER — Ambulatory Visit (INDEPENDENT_AMBULATORY_CARE_PROVIDER_SITE_OTHER): Payer: Medicare Other | Admitting: Ophthalmology

## 2023-09-24 DIAGNOSIS — Z7985 Long-term (current) use of injectable non-insulin antidiabetic drugs: Secondary | ICD-10-CM

## 2023-09-24 DIAGNOSIS — Z7984 Long term (current) use of oral hypoglycemic drugs: Secondary | ICD-10-CM | POA: Diagnosis not present

## 2023-09-24 DIAGNOSIS — I10 Essential (primary) hypertension: Secondary | ICD-10-CM

## 2023-09-24 DIAGNOSIS — H25813 Combined forms of age-related cataract, bilateral: Secondary | ICD-10-CM

## 2023-09-24 DIAGNOSIS — H35033 Hypertensive retinopathy, bilateral: Secondary | ICD-10-CM | POA: Diagnosis not present

## 2023-09-24 DIAGNOSIS — Z794 Long term (current) use of insulin: Secondary | ICD-10-CM | POA: Diagnosis not present

## 2023-09-24 DIAGNOSIS — E113513 Type 2 diabetes mellitus with proliferative diabetic retinopathy with macular edema, bilateral: Secondary | ICD-10-CM

## 2023-09-24 MED ORDER — AFLIBERCEPT 2MG/0.05ML IZ SOLN FOR KALEIDOSCOPE
2.0000 mg | INTRAVITREAL | Status: AC | PRN
  Administered 2023-09-24: 2 mg via INTRAVITREAL

## 2023-09-25 ENCOUNTER — Encounter (HOSPITAL_COMMUNITY)
Admission: RE | Admit: 2023-09-25 | Discharge: 2023-09-25 | Disposition: A | Discharge status: Home / Self Care | Payer: Medicare Other | Source: Ambulatory Visit | Attending: Cardiology

## 2023-09-25 ENCOUNTER — Telehealth: Payer: Self-pay

## 2023-09-25 DIAGNOSIS — I2089 Other forms of angina pectoris: Secondary | ICD-10-CM | POA: Diagnosis not present

## 2023-09-25 MED ORDER — RANOLAZINE ER 1000 MG PO TB12: 1000.0000 mg | ORAL_TABLET | Freq: Two times a day (BID) | ORAL | 3 refills | Status: DC

## 2023-09-25 NOTE — Telephone Encounter (Signed)
 Patient notified and agreeable to increasing Ranexa to 1000 mg BID. Patient had no further questions or concerns at this time.

## 2023-09-25 NOTE — Progress Notes (Signed)
 Daily Session Note  Patient Details  Name: Jodi Ray MRN: [id number] Date of Birth: [birth year] Referring Provider:   Flowsheet Row CARDIAC REHAB PHASE II ORIENTATION from 09/10/2023 in [facility] CARDIAC REHABILITATION  Referring Provider Dina Rich MD       Encounter Date: 09/25/2023  Check In:  Session Check In - 09/25/23 1032       Check-In   Supervising physician immediately available to respond to emergencies See telemetry face sheet for immediately available MD    Location [facility]    Staff Present Ross Ludwig, BS, Exercise Physiologist;Brittany Roseanne Reno, BSN, RN, Sherlyn Hay, MA, RCEP, CCRP, CCET;Phyllis Billingsley, RN    Virtual Visit No    Medication changes reported     Yes    Comments Increasing Ranexa to 1000mg  BID, picking up from pharmacy today    Fall or balance concerns reported    No    Warm-up and Cool-down Performed on first and last piece of equipment    Resistance Training Performed Yes    VAD Patient? No    PAD/SET Patient? No      Pain Assessment   Currently in Pain? No/denies             Capillary Blood Glucose: No results found for this or any previous visit (from the past 24 hours).    Social History   Tobacco Use  Smoking Status Never  Smokeless Tobacco Never    Goals Met:  Independence with exercise equipment Exercise tolerated well No report of concerns or symptoms today Strength training completed today  Goals Unmet:  Not Applicable  Comments: Pt able to follow exercise prescription today without complaint.  Will continue to monitor for progression.

## 2023-09-25 NOTE — Telephone Encounter (Signed)
-----   Message from Dina Rich sent at 09/25/2023  8:09 AM EST ----- Had talked with the cardiac rehab staff about some of her ongoing chest pain, can she increase her ranexa to 1000mg  bid please to see if that helps  Dominga Ferry MD

## 2023-09-26 ENCOUNTER — Encounter (HOSPITAL_COMMUNITY): Payer: Medicare Other

## 2023-09-26 ENCOUNTER — Encounter (HOSPITAL_COMMUNITY): Payer: Self-pay | Admitting: *Deleted

## 2023-09-26 ENCOUNTER — Ambulatory Visit (INDEPENDENT_AMBULATORY_CARE_PROVIDER_SITE_OTHER): Payer: Medicare Other

## 2023-09-26 DIAGNOSIS — E039 Hypothyroidism, unspecified: Secondary | ICD-10-CM | POA: Diagnosis not present

## 2023-09-26 DIAGNOSIS — Z794 Long term (current) use of insulin: Secondary | ICD-10-CM | POA: Diagnosis not present

## 2023-09-26 DIAGNOSIS — E1140 Type 2 diabetes mellitus with diabetic neuropathy, unspecified: Secondary | ICD-10-CM | POA: Diagnosis not present

## 2023-09-26 DIAGNOSIS — E785 Hyperlipidemia, unspecified: Secondary | ICD-10-CM | POA: Diagnosis not present

## 2023-09-26 DIAGNOSIS — Z9641 Presence of insulin pump (external) (internal): Secondary | ICD-10-CM | POA: Diagnosis not present

## 2023-09-26 DIAGNOSIS — I10 Essential (primary) hypertension: Secondary | ICD-10-CM | POA: Diagnosis not present

## 2023-09-26 DIAGNOSIS — M1460 Charcot's joint, unspecified site: Secondary | ICD-10-CM | POA: Diagnosis not present

## 2023-09-26 DIAGNOSIS — E11319 Type 2 diabetes mellitus with unspecified diabetic retinopathy without macular edema: Secondary | ICD-10-CM | POA: Diagnosis not present

## 2023-09-26 DIAGNOSIS — I2089 Other forms of angina pectoris: Secondary | ICD-10-CM

## 2023-09-26 DIAGNOSIS — I2510 Atherosclerotic heart disease of native coronary artery without angina pectoris: Secondary | ICD-10-CM | POA: Diagnosis not present

## 2023-09-26 DIAGNOSIS — E118 Type 2 diabetes mellitus with unspecified complications: Secondary | ICD-10-CM | POA: Diagnosis not present

## 2023-09-26 DIAGNOSIS — J309 Allergic rhinitis, unspecified: Secondary | ICD-10-CM | POA: Diagnosis not present

## 2023-09-26 DIAGNOSIS — E109 Type 1 diabetes mellitus without complications: Secondary | ICD-10-CM | POA: Diagnosis not present

## 2023-09-26 DIAGNOSIS — E669 Obesity, unspecified: Secondary | ICD-10-CM | POA: Diagnosis not present

## 2023-09-26 NOTE — Progress Notes (Addendum)
 Cardiac Individual Treatment Plan  Patient Details  Name: Jodi Ray MRN: [id number] Date of Birth: 11-03-1960 Referring Provider:   Flowsheet Row CARDIAC REHAB PHASE II ORIENTATION from 09/10/2023 in [facility] CARDIAC REHABILITATION  Referring Provider Dina Rich MD       Initial Encounter Date:  Flowsheet Row CARDIAC REHAB PHASE II ORIENTATION from 09/10/2023 in [place] CARDIAC REHABILITATION  Date 09/10/23       Visit Diagnosis: Stable angina (HCC)  Patient's Home Medications on Admission:  Current Outpatient Medications:    aspirin EC 81 MG tablet, Take 1 tablet (81 mg total) by mouth daily. Swallow whole., Disp: 90 tablet, Rfl: 3   atorvastatin (LIPITOR) 80 MG tablet, TAKE 1 TABLET BY MOUTH ONCE DAILY AT  6PM, Disp: 90 tablet, Rfl: 3   calcitRIOL (ROCALTROL) 0.25 MCG capsule, Take 0.25 mcg by mouth every Monday, Wednesday, and Friday., Disp: , Rfl:    carvedilol (COREG) 6.25 MG tablet, Take 1 tablet (6.25 mg total) by mouth 2 (two) times daily., Disp: 180 tablet, Rfl: 0   Continuous Blood Gluc Sensor (DEXCOM G6 SENSOR) MISC, 1 Device by Other route as directed., Disp: , Rfl:    EPINEPHRINE 0.3 mg/0.3 mL IJ SOAJ injection, INJECT CONTENTS OF ONE PEN INTO THE MUSCLE AS NEEDED FOR ANAPHYLAXIS, Disp: 2 each, Rfl: 0   furosemide (LASIX) 20 MG tablet, Take 20-40 mg by mouth See admin instructions. Take 2 tablets in the morning and 2 tablet in the evening, Disp: , Rfl:    hydrALAZINE (APRESOLINE) 25 MG tablet, Take 1 tablet (25 mg total) by mouth 3 (three) times daily., Disp: 270 tablet, Rfl: 3   Insulin Human (INSULIN PUMP) SOLN, Inject into the skin continuous. , Disp: , Rfl:    Insulin Pen Needle (PEN NEEDLES 31GX5/16") 31G X 8 MM MISC, , Disp: , Rfl:    JARDIANCE 10 MG TABS tablet, Take 5 mg by mouth every Monday, Wednesday, and Friday., Disp: , Rfl:    KERENDIA 10 MG TABS, Take 10 mg by mouth every Monday, Wednesday, and Friday., Disp: , Rfl:    Lactulose 20  GM/30ML SOLN, Take 15 mLs (10 g total) by mouth 2 (two) times daily., Disp: 900 mL, Rfl: 3   levothyroxine (SYNTHROID) 112 MCG tablet, Take 112 mcg by mouth daily before breakfast., Disp: , Rfl:    lubiprostone (AMITIZA) 24 MCG capsule, Take 1 capsule (24 mcg total) by mouth 2 (two) times daily with a meal., Disp: 60 capsule, Rfl: 11   metFORMIN (GLUCOPHAGE) 500 MG tablet, Take 1 tablet (500 mg total) by mouth 2 (two) times daily with a meal., Disp: , Rfl:    nitroGLYCERIN (NITROSTAT) 0.4 MG SL tablet, Place 1 tablet (0.4 mg total) under the tongue every 5 (five) minutes as needed for chest pain., Disp: 25 tablet, Rfl: 3   NOVOLOG 100 UNIT/ML injection, Inject into the skin continuous. In insulin pump, Disp: , Rfl: 0   Omega-3 Fatty Acids (FISH OIL) 1200 MG CAPS, Take 1,200 mg by mouth daily., Disp: , Rfl:    pantoprazole (PROTONIX) 40 MG tablet, Take 1 tablet (40 mg total) by mouth 2 (two) times daily before a meal. (Patient taking differently: Take 40 mg by mouth daily.), Disp: 60 tablet, Rfl: 3   ranolazine (RANEXA) 1000 MG SR tablet, Take 1 tablet (1,000 mg total) by mouth 2 (two) times daily., Disp: 180 tablet, Rfl: 3   verapamil (CALAN) 80 MG tablet, Take 1 tablet (80 mg total) by mouth  3 (three) times daily., Disp: 90 tablet, Rfl: 5  Past Medical History: Past Medical History:  Diagnosis Date   Adult RDS (HCC)    Anemia    Brain aneurysm    frontal lobe   CAD (coronary artery disease)    a. 02/2018: s/p DES to Proximal LAD with residual 20% RCA stenosis.   Chronic pain    Diabetes mellitus without complication (HCC)    Headache    History of left bundle branch block (LBBB)    HTN (hypertension)    Hx of cardiovascular stress test 10/2016   intermediate risk study   Hypothyroidism    Neuromuscular disorder (HCC)    Neuropathy    Obesity    OSA on CPAP 03/10/2015   Renal insufficiency    Retinopathy    Sleep apnea    Varicose veins of both lower extremities     Tobacco  Use: Social History   Tobacco Use  Smoking Status Never  Smokeless Tobacco Never    Labs: Review Flowsheet  More data exists      Latest Ref Rng & Units 07/23/2015 03/20/2018 04/16/2018 11/15/2019 06/28/2022  Labs for ITP Cardiac and Pulmonary Rehab  Cholestrol 0 - 200 mg/dL - - 604  - -  LDL (calc) 0 - 99 mg/dL - - 54  - -  HDL-C >54 mg/dL - - 75  - -  Trlycerides <150 mg/dL - - 23  - -  Hemoglobin A1c 4.8 - 5.6 % 9.1  7.8  - 7.8  6.3   Bicarbonate 20.0 - 28.0 mmol/L - - - 13.2  -  Acid-base deficit 0.0 - 2.0 mmol/L - - - 12.4  -  O2 Saturation % - - - 31.8  -    Capillary Blood Glucose: Lab Results  Component Value Date   GLUCAP 77 09/19/2023   GLUCAP 93 09/19/2023   GLUCAP 86 09/18/2023   GLUCAP 65 (L) 09/18/2023   GLUCAP 112 (H) 09/18/2023     Exercise Target Goals: Exercise Program Goal: Individual exercise prescription set using results from initial 6 min walk test and THRR while considering  patient's activity barriers and safety.   Exercise Prescription Goal: Starting with aerobic activity 30 plus minutes a day, 3 days per week for initial exercise prescription. Provide home exercise prescription and guidelines that participant acknowledges understanding prior to discharge.  Activity Barriers & Risk Stratification:  Activity Barriers & Cardiac Risk Stratification - 09/05/23 1307       Activity Barriers & Cardiac Risk Stratification   Activity Barriers History of Falls;Balance Concerns;Chest Pain/Angina;Assistive Device;Back Problems;Deconditioning;Shortness of Breath    Cardiac Risk Stratification High             6 Minute Walk:  6 Minute Walk     Row Name 09/10/23 0943         6 Minute Walk   Phase Initial     Distance 810 feet     Walk Time 6 minutes     # of Rest Breaks 1     MPH 1.53     METS 1.53     RPE 12     Perceived Dyspnea  1     VO2 Peak 5.37     Symptoms Yes (comment)     Comments one standing 30 second break     Resting HR  72 bpm     Resting BP 120/60     Resting Oxygen Saturation  96 %  Exercise Oxygen Saturation  during 6 min walk 97 %     Max Ex. HR 2.44 bpm     Max Ex. BP 156/64     2 Minute Post BP 140/60              Oxygen Initial Assessment:   Oxygen Re-Evaluation:   Oxygen Discharge (Final Oxygen Re-Evaluation):   Initial Exercise Prescription:  Initial Exercise Prescription - 09/10/23 0900       Date of Initial Exercise RX and Referring Provider   Date 09/10/23    Referring Provider Dina Rich MD      NuStep   Level 2    SPM 50    Minutes 15    METs 2      Recumbant Elliptical   Level 2    RPM 50    Minutes 15    METs 3      Prescription Details   Frequency (times per week) 3    Duration Progress to 30 minutes of continuous aerobic without signs/symptoms of physical distress      Intensity   THRR 40-80% of Max Heartrate 106-139    Ratings of Perceived Exertion 11-13    Perceived Dyspnea 0-4      Resistance Training   Training Prescription Yes    Weight 3    Reps 10-15             Perform Capillary Blood Glucose checks as needed.  Exercise Prescription Changes:   Exercise Prescription Changes     Row Name 09/10/23 0900 09/19/23 1000           Response to Exercise   Blood Pressure (Admit) 120/60 158/64      Blood Pressure (Exercise) 156/64 178/80      Blood Pressure (Exit) 140/60 132/68      Heart Rate (Admit) 72 bpm 90 bpm      Heart Rate (Exercise) 94 bpm 119 bpm      Heart Rate (Exit) 76 bpm 99 bpm      Oxygen Saturation (Admit) 96 % --      Oxygen Saturation (Exercise) 97 % --      Oxygen Saturation (Exit) 97 % --      Rating of Perceived Exertion (Exercise) 12 11      Perceived Dyspnea (Exercise) 1 --      Duration Progress to 30 minutes of  aerobic without signs/symptoms of physical distress Continue with 30 min of aerobic exercise without signs/symptoms of physical distress.      Intensity -- THRR unchanged         Progression   Progression -- Continue to progress workloads to maintain intensity without signs/symptoms of physical distress.        Resistance Training   Training Prescription -- Yes      Weight -- 5      Reps -- 10-15        NuStep   Level -- 4      SPM -- 56      Minutes -- 15      METs -- 2.1        Recumbant Elliptical   Level -- 4      RPM -- 122      Minutes -- 15      METs -- 2.5               Exercise Comments:   Exercise Goals and Review:   Exercise Goals  Row Name 09/10/23 0948             Exercise Goals   Increase Physical Activity Yes       Intervention Provide advice, education, support and counseling about physical activity/exercise needs.;Develop an individualized exercise prescription for aerobic and resistive training based on initial evaluation findings, risk stratification, comorbidities and participant's personal goals.       Expected Outcomes Short Term: Attend rehab on a regular basis to increase amount of physical activity.;Long Term: Add in home exercise to make exercise part of routine and to increase amount of physical activity.;Long Term: Exercising regularly at least 3-5 days a week.       Increase Strength and Stamina Yes       Intervention Provide advice, education, support and counseling about physical activity/exercise needs.;Develop an individualized exercise prescription for aerobic and resistive training based on initial evaluation findings, risk stratification, comorbidities and participant's personal goals.       Expected Outcomes Short Term: Increase workloads from initial exercise prescription for resistance, speed, and METs.;Short Term: Perform resistance training exercises routinely during rehab and add in resistance training at home;Long Term: Improve cardiorespiratory fitness, muscular endurance and strength as measured by increased METs and functional capacity ( )       Able to understand and use rate of perceived  exertion (RPE) scale Yes       Intervention Provide education and explanation on how to use RPE scale       Expected Outcomes Short Term: Able to use RPE daily in rehab to express subjective intensity level;Long Term:  Able to use RPE to guide intensity level when exercising independently       Able to understand and use Dyspnea scale Yes       Intervention Provide education and explanation on how to use Dyspnea scale       Expected Outcomes Short Term: Able to use Dyspnea scale daily in rehab to express subjective sense of shortness of breath during exertion;Long Term: Able to use Dyspnea scale to guide intensity level when exercising independently       Knowledge and understanding of Target Heart Rate Range (THRR) Yes       Intervention Provide education and explanation of THRR including how the numbers were predicted and where they are located for reference       Expected Outcomes Short Term: Able to state/look up THRR;Long Term: Able to use THRR to govern intensity when exercising independently;Short Term: Able to use daily as guideline for intensity in rehab       Able to check pulse independently Yes       Intervention Provide education and demonstration on how to check pulse in carotid and radial arteries.;Review the importance of being able to check your own pulse for safety during independent exercise       Expected Outcomes Long Term: Able to check pulse independently and accurately;Short Term: Able to explain why pulse checking is important during independent exercise       Understanding of Exercise Prescription Yes       Intervention Provide education, explanation, and written materials on patient's individual exercise prescription       Expected Outcomes Short Term: Able to explain program exercise prescription;Long Term: Able to explain home exercise prescription to exercise independently                Exercise Goals Re-Evaluation :  Exercise Goals Re-Evaluation     Row Name  09/19/23 1002  Exercise Goal Re-Evaluation   Exercise Goals Review Increase Physical Activity;Increase Strength and Stamina;Understanding of Exercise Prescription       Comments Olegario Messier is doijg well in rehab and tolerating exercise well. She has recently noticed her angina the past couple days when exercisng and is going to ask her cardiologist about taking her Nitro. Will continue to monitor and update as able.       Expected Outcomes continue to attend rehab                 Discharge Exercise Prescription (Final Exercise Prescription Changes):  Exercise Prescription Changes - 09/19/23 1000       Response to Exercise   Blood Pressure (Admit) 158/64    Blood Pressure (Exercise) 178/80    Blood Pressure (Exit) 132/68    Heart Rate (Admit) 90 bpm    Heart Rate (Exercise) 119 bpm    Heart Rate (Exit) 99 bpm    Rating of Perceived Exertion (Exercise) 11    Duration Continue with 30 min of aerobic exercise without signs/symptoms of physical distress.    Intensity THRR unchanged      Progression   Progression Continue to progress workloads to maintain intensity without signs/symptoms of physical distress.      Resistance Training   Training Prescription Yes    Weight 5    Reps 10-15      NuStep   Level 4    SPM 56    Minutes 15    METs 2.1      Recumbant Elliptical   Level 4    RPM 122    Minutes 15    METs 2.5             Nutrition:  Target Goals: Understanding of nutrition guidelines, daily intake of sodium 1500mg , cholesterol 200mg , calories 30% from fat and 7% or less from saturated fats, daily to have 5 or more servings of fruits and vegetables.  Biometrics:  Pre Biometrics - 09/10/23 0949       Pre Biometrics   Height 5' 6.5" (1.689 m)    Weight 272 lb 14.9 oz (123.8 kg)    Waist Circumference 46 inches    Hip Circumference 54 inches    Waist to Hip Ratio 0.85 %    BMI (Calculated) 43.4    Grip Strength 28 kg               Nutrition Therapy Plan and Nutrition Goals:   Nutrition Assessments:  MEDIFICTS Score Key: >=70 Need to make dietary changes  40-70 Heart Healthy Diet <= 40 Therapeutic Level Cholesterol Diet  Flowsheet Row CARDIAC REHAB PHASE II EXERCISE from 09/14/2023 in [facility] CARDIAC REHABILITATION  Picture Your Plate Total Score on Admission 7      Picture Your Plate Scores: <16 Unhealthy dietary pattern with much room for improvement. 41-50 Dietary pattern unlikely to meet recommendations for good health and room for improvement. 51-60 More healthful dietary pattern, with some room for improvement.  >60 Healthy dietary pattern, although there may be some specific behaviors that could be improved.    Nutrition Goals Re-Evaluation:   Nutrition Goals Discharge (Final Nutrition Goals Re-Evaluation):   Psychosocial: Target Goals: Acknowledge presence or absence of significant depression and/or stress, maximize coping skills, provide positive support system. Participant is able to verbalize types and ability to use techniques and skills needed for reducing stress and depression.  Initial Review & Psychosocial Screening:  Initial Psych Review & Screening - 09/05/23 1326  Initial Review   Current issues with None Identified      Family Dynamics   Good Support System? Yes      Barriers   Psychosocial barriers to participate in program There are no identifiable barriers or psychosocial needs.      Screening Interventions   Interventions Encouraged to exercise;To provide support and resources with identified psychosocial needs;Provide feedback about the scores to participant    Expected Outcomes Short Term goal: Identification and review with participant of any Quality of Life or Depression concerns found by scoring the questionnaire.;Long Term goal: The participant improves quality of Life and PHQ9 Scores as seen by post scores and/or verbalization of changes;Long Term Goal:  Stressors or current issues are controlled or eliminated.;Short Term goal: Utilizing psychosocial counselor, staff and physician to assist with identification of specific Stressors or current issues interfering with healing process. Setting desired goal for each stressor or current issue identified.             Quality of Life Scores:  Quality of Life - 09/14/23 1231       Quality of Life   Select Quality of Life      Quality of Life Scores   Health/Function Pre 24.32 %    Socioeconomic Pre 25.75 %    Psych/Spiritual Pre 28.43 %    Family Pre 19.5 %    GLOBAL Pre 24.9 %            Scores of 19 and below usually indicate a poorer quality of life in these areas.  A difference of  2-3 points is a clinically meaningful difference.  A difference of 2-3 points in the total score of the Quality of Life Index has been associated with significant improvement in overall quality of life, self-image, physical symptoms, and general health in studies assessing change in quality of life.  PHQ-9: Review Flowsheet  More data exists      09/10/2023 09/26/2018 06/17/2018 04/09/2017 08/04/2015  Depression screen PHQ 2/9  Decreased Interest 0 0 0 0 0  Down, Depressed, Hopeless 0 0 0 0 0  PHQ - 2 Score 0 0 0 0 0  Altered sleeping 0 3 0 - -  Tired, decreased energy 1 0 0 - -  Change in appetite 0 0 0 - -  Feeling bad or failure about yourself  0 0 0 - -  Trouble concentrating 0 0 0 - -  Moving slowly or fidgety/restless 0 0 0 - -  Suicidal thoughts 0 0 0 - -  PHQ-9 Score 1 3 0 - -  Difficult doing work/chores Not difficult at all Not difficult at all Not difficult at all - -   Interpretation of Total Score  Total Score Depression Severity:  1-4 = Minimal depression, 5-9 = Mild depression, 10-14 = Moderate depression, 15-19 = Moderately severe depression, 20-27 = Severe depression   Psychosocial Evaluation and Intervention:  Psychosocial Evaluation - 09/05/23 1326       Psychosocial  Evaluation & Interventions   Interventions Stress management education;Relaxation education;Encouraged to exercise with the program and follow exercise prescription    Comments Patient was referred to [facility] with stable angina. She has participated in our program pre Covid and joined our maintenance program. She was exercising at the [facility] but has had a lot of stressors in her life and she stopped exercising and then started having angina. She did have a heart cath and medical management was the treatment recommendation. She did not share  what her stressors were but did say she was seeing a counselor routinely and he had her on an antidepressant but this has been discontinued and she thinks her last visit with him will be the last one. She says the stressful situations have improved. She denies any depression or anxiety. She says she sleeps well at night. She says she has a great support system with her boyfriend and her church family. She is very excited to be participating in the program again. Her main goals for the program are to lose weight; get back into an exercise routine and improve her angina symptoms. She has no barriers identified to participate in the program.    Expected Outcomes Short Term: Start the program and attend consistently. Long Term: Complete the program meeting her personal goals.    Continue Psychosocial Services  Follow up required by staff             Psychosocial Re-Evaluation:   Psychosocial Discharge (Final Psychosocial Re-Evaluation):   Vocational Rehabilitation: Provide vocational rehab assistance to qualifying candidates.   Vocational Rehab Evaluation & Intervention:  Vocational Rehab - 09/05/23 1325       Initial Vocational Rehab Evaluation & Intervention   Assessment shows need for Vocational Rehabilitation No      Vocational Rehab Re-Evaulation   Comments Patient is disabled/retired.             Education: Education Goals: Education classes will  be provided on a weekly basis, covering required topics. Participant will state understanding/return demonstration of topics presented.  Learning Barriers/Preferences:  Learning Barriers/Preferences - 09/05/23 1312       Learning Barriers/Preferences   Learning Barriers None    Learning Preferences Audio;Computer/Internet;Group Instruction;Individual Instruction;Pictoral;Skilled Demonstration;Written Material             Education Topics: Hypertension, Hypertension Reduction -Define heart disease and high blood pressure. Discus how high blood pressure affects the body and ways to reduce high blood pressure. Flowsheet Row CARDIAC REHAB PHASE II EXERCISE from 09/25/2018 in [place] CARDIAC REHABILITATION  Date 09/04/18  Educator DJ  Instruction Review Code 2- Demonstrated Understanding       Exercise and Your Heart -Discuss why it is important to exercise, the FITT principles of exercise, normal and abnormal responses to exercise, and how to exercise safely. Flowsheet Row CARDIAC REHAB PHASE II EXERCISE from 09/25/2018 in [place] CARDIAC REHABILITATION  Date 09/11/18  Educator Laural Benes  Instruction Review Code 2- Demonstrated Understanding       Angina -Discuss definition of angina, causes of angina, treatment of angina, and how to decrease risk of having angina. Flowsheet Row CARDIAC REHAB PHASE II EXERCISE from 09/25/2018 in [place] CARDIAC REHABILITATION  Date 09/18/18  Educator Laural Benes  Instruction Review Code 2- Demonstrated Understanding       Cardiac Medications -Review what the following cardiac medications are used for, how they affect the body, and side effects that may occur when taking the medications.  Medications include Aspirin, Beta blockers, calcium channel blockers, ACE Inhibitors, angiotensin receptor blockers, diuretics, digoxin, and antihyperlipidemics. Flowsheet Row CARDIAC REHAB PHASE II EXERCISE from 09/25/2018 in [place] CARDIAC  REHABILITATION  Date 06/26/18  Educator Timoteo Expose  Instruction Review Code 2- Demonstrated Understanding       Congestive Heart Failure -Discuss the definition of CHF, how to live with CHF, the signs and symptoms of CHF, and how keep track of weight and sodium intake. Flowsheet Row CARDIAC REHAB PHASE II EXERCISE from 09/25/2018 in [place]  CARDIAC REHABILITATION  Date 07/03/18  Educator Laural Benes  Instruction Review Code 2- Demonstrated Understanding       Heart Disease and Intimacy -Discus the effect sexual activity has on the heart, how changes occur during intimacy as we age, and safety during sexual activity. Flowsheet Row CARDIAC REHAB PHASE II EXERCISE from 09/19/2023 in [place] CARDIAC REHABILITATION  Date 09/19/23  Educator jh  Instruction Review Code 1- Verbalizes Understanding       Smoking Cessation / COPD -Discuss different methods to quit smoking, the health benefits of quitting smoking, and the definition of COPD. Flowsheet Row CARDIAC REHAB PHASE II EXERCISE from 09/25/2018 in [place] CARDIAC REHABILITATION  Date 07/17/18  Educator Timoteo Expose   Instruction Review Code 2- Demonstrated Understanding       Nutrition I: Fats -Discuss the types of cholesterol, what cholesterol does to the heart, and how cholesterol levels can be controlled.   Nutrition II: Labels -Discuss the different components of food labels and how to read food label Flowsheet Row CARDIAC REHAB PHASE II EXERCISE from 09/25/2018 in [place] [facility] CARDIAC REHABILITATION  Date 08/07/18  Educator DC  Instruction Review Code 2- Demonstrated Understanding       Heart Parts/Heart Disease and PAD -Discuss the anatomy of the heart, the pathway of blood circulation through the heart, and these are affected by heart disease. Flowsheet Row CARDIAC REHAB PHASE II EXERCISE from 09/25/2018 in [place] CARDIAC REHABILITATION  Date 08/02/18  Educator Laural Benes  Instruction Review Code 2-  Demonstrated Understanding       Stress I: Signs and Symptoms -Discuss the causes of stress, how stress may lead to anxiety and depression, and ways to limit stress. Flowsheet Row CARDIAC REHAB PHASE II EXERCISE from 09/25/2018 in [place] CARDIAC REHABILITATION  Date 08/14/18  Educator DJ  Instruction Review Code 2- Demonstrated Understanding       Stress II: Relaxation -Discuss different types of relaxation techniques to limit stress.   Warning Signs of Stroke / TIA -Discuss definition of a stroke, what the signs and symptoms are of a stroke, and how to identify when someone is having stroke. Flowsheet Row CARDIAC REHAB PHASE II EXERCISE from 09/25/2018 in [place] CARDIAC REHABILITATION  Date 08/28/18  Educator DJ  Instruction Review Code 2- Demonstrated Understanding       Knowledge Questionnaire Score:  Knowledge Questionnaire Score - 09/14/23 0846       Knowledge Questionnaire Score   Pre Score 23/24             Core Components/Risk Factors/Patient Goals at Admission:  Personal Goals and Risk Factors at Admission - 09/05/23 1325       Core Components/Risk Factors/Patient Goals on Admission    Weight Management Obesity    Improve shortness of breath with ADL's Yes    Intervention Provide education, individualized exercise plan and daily activity instruction to help decrease symptoms of SOB with activities of daily living.    Expected Outcomes Short Term: Improve cardiorespiratory fitness to achieve a reduction of symptoms when performing ADLs;Long Term: Be able to perform more ADLs without symptoms or delay the onset of symptoms    Diabetes Yes    Intervention Provide education about signs/symptoms and action to take for hypo/hyperglycemia.;Provide education about proper nutrition, including hydration, and aerobic/resistive exercise prescription along with prescribed medications to achieve blood glucose in normal ranges: Fasting glucose 65-99 mg/dL     Expected Outcomes Short Term: Participant verbalizes understanding of the signs/symptoms and immediate care  of hyper/hypoglycemia, proper foot care and importance of medication, aerobic/resistive exercise and nutrition plan for blood glucose control.;Long Term: Attainment of HbA1C < 7%.    Heart Failure Yes    Intervention Provide a combined exercise and nutrition program that is supplemented with education, support and counseling about heart failure. Directed toward relieving symptoms such as shortness of breath, decreased exercise tolerance, and extremity edema.    Expected Outcomes Improve functional capacity of life;Short term: Attendance in program 2-3 days a week with increased exercise capacity. Reported lower sodium intake. Reported increased fruit and vegetable intake. Reports medication compliance.;Short term: Daily weights obtained and reported for increase. Utilizing diuretic protocols set by physician.;Long term: Adoption of self-care skills and reduction of barriers for early signs and symptoms recognition and intervention leading to self-care maintenance.    Hypertension Yes    Intervention Provide education on lifestyle modifcations including regular physical activity/exercise, weight management, moderate sodium restriction and increased consumption of fresh fruit, vegetables, and low fat dairy, alcohol moderation, and smoking cessation.;Monitor prescription use compliance.    Expected Outcomes Short Term: Continued assessment and intervention until BP is < 140/66mm HG in hypertensive participants. < 130/35mm HG in hypertensive participants with diabetes, heart failure or chronic kidney disease.;Long Term: Maintenance of blood pressure at goal levels.    Lipids Yes    Intervention Provide education and support for participant on nutrition & aerobic/resistive exercise along with prescribed medications to achieve LDL 70mg , HDL >40mg .    Expected Outcomes Short Term: Participant states  understanding of desired cholesterol values and is compliant with medications prescribed. Participant is following exercise prescription and nutrition guidelines.;Long Term: Cholesterol controlled with medications as prescribed, with individualized exercise RX and with personalized nutrition plan. Value goals: LDL < 70mg , HDL > 40 mg.             Core Components/Risk Factors/Patient Goals Review:    Core Components/Risk Factors/Patient Goals at Discharge (Final Review):    ITP Comments:  ITP Comments     Row Name 09/05/23 1337 09/10/23 0943 09/26/23 0800       ITP Comments Virtual visit completed 09/05/23. Her orientation visit is scheduled for Monday 2/10 at 8am. Patient arrived for 1st visit/orientation/education at 0800. Patient was referred to CR by Dr. Dina Rich due to Stable Angina. During orientation advised patient on arrival and appointment times what to wear, what to do before, during and after exercise. Reviewed attendance and class policy.  Pt is scheduled to return Cardiac Rehab on 09/12/23 at 745. Pt was advised to come to class 15 minutes before class starts.  Discussed RPE/Dpysnea scales. Patient participated in warm up stretches. Patient was able to complete 6 minute walk test.  She did report chest tightness 7/10 during walk test. It subsided to 2/10 after rest. She stated it was not bad enough to take a NTG. Telemetry:SR. Patient was measured for the equipment. Discussed equipment safety with patient. Took patient pre-anthropometric 30 day review completed. ITP sent to Dr. Dina Rich, Medical Director of Cardiac Rehab. Continue with ITP unless changes are made by physician. Still newer to program.  Working on adjusting workloads with her anginal symptoms.              Comments: 30 day review

## 2023-09-27 ENCOUNTER — Encounter (HOSPITAL_COMMUNITY): Payer: Medicare Other

## 2023-09-28 ENCOUNTER — Encounter (HOSPITAL_COMMUNITY)
Admission: RE | Admit: 2023-09-28 | Discharge: 2023-09-28 | Disposition: A | Discharge status: Home / Self Care | Payer: Medicare Other | Source: Ambulatory Visit | Attending: Cardiology | Admitting: Cardiology

## 2023-09-28 DIAGNOSIS — I2089 Other forms of angina pectoris: Secondary | ICD-10-CM

## 2023-09-28 NOTE — Progress Notes (Signed)
 Daily Session Note  Patient Details  Name: Jodi Ray MRN: [id number] Date of Birth: [birth year] Referring Provider:   Flowsheet Row CARDIAC REHAB PHASE II ORIENTATION from 09/10/2023 in [facility] CARDIAC REHABILITATION  Referring Provider Dina Rich MD       Encounter Date: 09/28/2023  Check In:  Session Check In - 09/28/23 0755       Check-In   Supervising physician immediately available to respond to emergencies See telemetry face sheet for immediately available MD    Location [facility]    Staff Present Fabio Pierce, MA, RCEP, CCRP, CCET;Brittany Roseanne Reno, BSN, RN, WTA-C    Virtual Visit No    Medication changes reported     No    Fall or balance concerns reported    No    Warm-up and Cool-down Performed on first and last piece of equipment    Resistance Training Performed Yes    VAD Patient? No    PAD/SET Patient? No      Pain Assessment   Currently in Pain? No/denies             Capillary Blood Glucose: No results found for this or any previous visit (from the past 24 hours).    Social History   Tobacco Use  Smoking Status Never  Smokeless Tobacco Never    Goals Met:  Independence with exercise equipment Exercise tolerated well No report of concerns or symptoms today Strength training completed today  Goals Unmet:  Not Applicable  Comments: Pt able to follow exercise prescription today without complaint.  Will continue to monitor for progression.

## 2023-10-01 ENCOUNTER — Encounter (HOSPITAL_COMMUNITY)
Admission: RE | Admit: 2023-10-01 | Discharge: 2023-10-01 | Disposition: A | Discharge status: Home / Self Care | Payer: Medicare Other | Source: Ambulatory Visit | Attending: Cardiology | Admitting: Cardiology

## 2023-10-01 DIAGNOSIS — I2089 Other forms of angina pectoris: Secondary | ICD-10-CM | POA: Insufficient documentation

## 2023-10-01 NOTE — Progress Notes (Signed)
 Daily Session Note  Patient Details  Name: Jodi Ray MRN: [id number] Date of Birth: 1961/06/13 Referring Provider:   Flowsheet Row CARDIAC REHAB PHASE II ORIENTATION from 09/10/2023 in [facility] CARDIAC REHABILITATION  Referring Provider Dina Rich MD       Encounter Date: 10/01/2023  Check In:  Session Check In - 10/01/23 0745       Check-In   Supervising physician immediately available to respond to emergencies See telemetry face sheet for immediately available MD    Location [facility]    Staff Present Ross Ludwig, BS, Exercise Physiologist;Geneieve Duell Roseanne Reno, BSN, RN, WTA-C    Virtual Visit No    Medication changes reported     Yes    Comments Taking Lasix 40mg  BID    Fall or balance concerns reported    No    Tobacco Cessation No Change    Warm-up and Cool-down Performed on first and last piece of equipment    Resistance Training Performed Yes    VAD Patient? No    PAD/SET Patient? No      Pain Assessment   Currently in Pain? No/denies             Capillary Blood Glucose: No results found for this or any previous visit (from the past 24 hours).    Social History   Tobacco Use  Smoking Status Never  Smokeless Tobacco Never    Goals Met:  Independence with exercise equipment Exercise tolerated well No report of concerns or symptoms today Strength training completed today  Goals Unmet:  Not Applicable  Comments: Pt able to follow exercise prescription today without complaint.  Will continue to monitor for progression.

## 2023-10-02 ENCOUNTER — Ambulatory Visit: Payer: Medicare Other | Admitting: Nurse Practitioner

## 2023-10-03 ENCOUNTER — Encounter (HOSPITAL_COMMUNITY): Payer: Medicare Other

## 2023-10-04 ENCOUNTER — Encounter: Payer: Self-pay | Admitting: Cardiology
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
## 2023-10-05 ENCOUNTER — Encounter: Payer: Self-pay | Admitting: Neurology

## 2023-10-05 ENCOUNTER — Ambulatory Visit (INDEPENDENT_AMBULATORY_CARE_PROVIDER_SITE_OTHER): Payer: Medicare Other | Admitting: Neurology

## 2023-10-05 ENCOUNTER — Encounter (HOSPITAL_COMMUNITY)
Admission: RE | Admit: 2023-10-05 | Discharge: 2023-10-05 | Disposition: A | Discharge status: Home / Self Care | Payer: Medicare Other | Source: Ambulatory Visit | Attending: Cardiology | Admitting: Cardiology

## 2023-10-05 VITALS — BP 144/62 | HR 80 | Ht 68.0 in | Wt 264.0 lb

## 2023-10-05 DIAGNOSIS — G44019 Episodic cluster headache, not intractable: Secondary | ICD-10-CM | POA: Diagnosis not present

## 2023-10-05 DIAGNOSIS — I2089 Other forms of angina pectoris: Secondary | ICD-10-CM | POA: Diagnosis not present

## 2023-10-05 MED ORDER — VERAPAMIL HCL 80 MG PO TABS: 80.0000 mg | ORAL_TABLET | Freq: Three times a day (TID) | ORAL | 5 refills | Status: AC

## 2023-10-05 NOTE — Progress Notes (Signed)
 Daily Session Note  Patient Details  Name: Jodi Ray MRN: [id number] Date of Birth: 1960/10/06 Referring Provider:   Flowsheet Row CARDIAC REHAB PHASE II ORIENTATION from 09/10/2023 in [facility] CARDIAC REHABILITATION  Referring Provider Dina Rich MD       Encounter Date: 10/05/2023  Check In:  Session Check In - 10/05/23 0811       Check-In   Supervising physician immediately available to respond to emergencies See telemetry face sheet for immediately available MD    Location [facility]    Staff Present Fabio Pierce, MA, RCEP, CCRP, Dow Adolph, RN, BSN;Heather Fredric Mare, [place], Exercise Physiologist    Virtual Visit No    Medication changes reported     No    Fall or balance concerns reported    No    Warm-up and Cool-down Performed on first and last piece of equipment    Resistance Training Performed Yes    VAD Patient? No    PAD/SET Patient? No      Pain Assessment   Currently in Pain? No/denies             Capillary Blood Glucose: No results found for this or any previous visit (from the past 24 hours).    Social History   Tobacco Use  Smoking Status Never  Smokeless Tobacco Never    Goals Met:  Independence with exercise equipment Exercise tolerated well No report of concerns or symptoms today Strength training completed today  Goals Unmet:  Not Applicable  Comments: Pt able to follow exercise prescription today without complaint.  Will continue to monitor for progression.   Reviewed home exercise with pt today.  Pt plans to walk with dog at home for exercise.  She also has a treadmill and stationary bike at home as well.  Reviewed THR, pulse, RPE, sign and symptoms, pulse oximetery and when to call 911 or MD.  Also discussed weather considerations and indoor options.  Pt voiced understanding.

## 2023-10-05 NOTE — Patient Instructions (Signed)
 Continue verapamil 80mg  three times daily Oxygen as needed

## 2023-10-08 ENCOUNTER — Encounter (HOSPITAL_COMMUNITY)
Admission: RE | Admit: 2023-10-08 | Discharge: 2023-10-08 | Disposition: A | Discharge status: Home / Self Care | Payer: Medicare Other | Source: Ambulatory Visit | Attending: Cardiology | Admitting: Cardiology

## 2023-10-08 DIAGNOSIS — I2089 Other forms of angina pectoris: Secondary | ICD-10-CM | POA: Diagnosis not present

## 2023-10-08 NOTE — Progress Notes (Signed)
 Daily Session Note  Patient Details  Name: Jodi Ray MRN: [id number] Date of Birth: [birth year] Referring Provider:   Flowsheet Row CARDIAC REHAB PHASE II ORIENTATION from 09/10/2023 in [facility] CARDIAC REHABILITATION  Referring Provider Dina Rich MD       Encounter Date: 10/08/2023  Check In:  Session Check In - 10/08/23 0745       Check-In   Supervising physician immediately available to respond to emergencies See telemetry face sheet for immediately available MD    Location [facility]    Staff Present Fabio Pierce, MA, RCEP, CCRP, CCET;Maizy Davanzo Fredric Mare, BS, Exercise Physiologist;Brittany Roseanne Reno, BSN, RN, WTA-C    Virtual Visit No    Medication changes reported     No    Fall or balance concerns reported    No    Tobacco Cessation No Change    Warm-up and Cool-down Performed on first and last piece of equipment    Resistance Training Performed Yes    VAD Patient? No    PAD/SET Patient? No      Pain Assessment   Currently in Pain? No/denies    Multiple Pain Sites No             Capillary Blood Glucose: No results found for this or any previous visit (from the past 24 hours).    Social History   Tobacco Use  Smoking Status Never  Smokeless Tobacco Never    Goals Met:  Independence with exercise equipment Exercise tolerated well No report of concerns or symptoms today Strength training completed today  Goals Unmet:  Not Applicable  Comments: Pt able to follow exercise prescription today without complaint.  Will continue to monitor for progression.

## 2023-10-09 ENCOUNTER — Other Ambulatory Visit: Payer: Self-pay | Admitting: Gastroenterology

## 2023-10-09 ENCOUNTER — Telehealth: Payer: Self-pay | Admitting: Cardiology

## 2023-10-09 DIAGNOSIS — R110 Nausea: Secondary | ICD-10-CM

## 2023-10-09 MED ORDER — CARVEDILOL 6.25 MG PO TABS: 9.3750 mg | ORAL_TABLET | Freq: Two times a day (BID) | ORAL | 3 refills | Status: DC

## 2023-10-09 NOTE — Telephone Encounter (Signed)
 Patient notified and verbalized that she can feel her heart skipping beats while at CR. Pt voiced agreement in increasing Coreg to 9.375 twice daily. Pt had no questions or concerns at this time.

## 2023-10-09 NOTE — Telephone Encounter (Signed)
 Pt returning call to a nurse

## 2023-10-09 NOTE — Telephone Encounter (Signed)
-----   Message from Dina Rich sent at 10/08/2023  2:56 PM EDT ----- Received message about some PVCs during cardiac rehab that are symptomatic. If patient confirms feelings of heart skipping at times, please increase coreg to 9.375mg  bid  Dominga Ferry MD

## 2023-10-10 ENCOUNTER — Encounter (HOSPITAL_COMMUNITY)
Admission: RE | Admit: 2023-10-10 | Discharge: 2023-10-10 | Disposition: A | Discharge status: Home / Self Care | Payer: Medicare Other | Source: Ambulatory Visit | Attending: Cardiology | Admitting: Cardiology

## 2023-10-10 DIAGNOSIS — E109 Type 1 diabetes mellitus without complications: Secondary | ICD-10-CM | POA: Diagnosis not present

## 2023-10-10 DIAGNOSIS — I10 Essential (primary) hypertension: Secondary | ICD-10-CM | POA: Diagnosis not present

## 2023-10-10 DIAGNOSIS — E039 Hypothyroidism, unspecified: Secondary | ICD-10-CM | POA: Diagnosis not present

## 2023-10-10 DIAGNOSIS — I2089 Other forms of angina pectoris: Secondary | ICD-10-CM | POA: Diagnosis not present

## 2023-10-10 DIAGNOSIS — Z9641 Presence of insulin pump (external) (internal): Secondary | ICD-10-CM | POA: Diagnosis not present

## 2023-10-10 NOTE — Progress Notes (Signed)
 Daily Session Note  Patient Details  Name: Jodi Ray MRN: [id number] Date of Birth: June 01, 1961 Referring Provider:   Flowsheet Row CARDIAC REHAB PHASE II ORIENTATION from 09/10/2023 in [facility] CARDIAC REHABILITATION  Referring Provider Dina Rich MD       Encounter Date: 10/10/2023  Check In:  Session Check In - 10/10/23 0801       Check-In   Supervising physician immediately available to respond to emergencies See telemetry face sheet for immediately available MD    Location [facility]    Staff Present Ross Ludwig, BS, Exercise Physiologist;Flornce Record Juanetta Gosling, MA, RCEP, CCRP, Dow Adolph, RN, BSN    Virtual Visit No    Medication changes reported     Yes    Comments increased coreg to 9.35mg  daily  picks up today    Fall or balance concerns reported    No    Warm-up and Cool-down Performed on first and last piece of equipment    Resistance Training Performed Yes    VAD Patient? No    PAD/SET Patient? No      Pain Assessment   Currently in Pain? No/denies             Capillary Blood Glucose: No results found for this or any previous visit (from the past 24 hours).    Social History   Tobacco Use  Smoking Status Never  Smokeless Tobacco Never    Goals Met:  Independence with exercise equipment Exercise tolerated well No report of concerns or symptoms today Strength training completed today  Goals Unmet:  Not Applicable  Comments: Pt able to follow exercise prescription today without complaint.  Will continue to monitor for progression.

## 2023-10-11 ENCOUNTER — Encounter (HOSPITAL_COMMUNITY)
Admission: RE | Admit: 2023-10-11 | Discharge: 2023-10-11 | Disposition: A | Discharge status: Home / Self Care | Source: Ambulatory Visit | Attending: Cardiology | Admitting: Cardiology

## 2023-10-11 DIAGNOSIS — I2089 Other forms of angina pectoris: Secondary | ICD-10-CM | POA: Diagnosis not present

## 2023-10-11 LAB — GLUCOSE, CAPILLARY
Glucose-Capillary: 22 mg/dL — CL (ref 70–99)
Glucose-Capillary: 75 mg/dL (ref 70–99)

## 2023-10-11 NOTE — Progress Notes (Addendum)
 Daily Session Note  Patient Details  Name: Jodi Ray MRN: [id number] Date of Birth: Dec 01, 1960 Referring Provider:   Flowsheet Row CARDIAC REHAB PHASE II ORIENTATION from 09/10/2023 in [facility] CARDIAC REHABILITATION  Referring Provider Dina Rich MD       Encounter Date: 10/11/2023  Check In:  Session Check In - 10/11/23 1437       Check-In   Supervising physician immediately available to respond to emergencies See telemetry face sheet for immediately available MD    Location [facility]    Staff Present Avanell Shackleton BSN, RN;Jessica Cannel[place], [place], RCEP, CCRP, Dow Adolph, RN, BSN    Virtual Visit No    Medication changes reported     No    Fall or balance concerns reported    No    Tobacco Cessation No Change    Warm-up and Cool-down Performed on first and last piece of equipment    Resistance Training Performed Yes    VAD Patient? No    PAD/SET Patient? No      Pain Assessment   Currently in Pain? No/denies    Multiple Pain Sites No             Capillary Blood Glucose: No results found for this or any previous visit (from the past 24 hours).    Social History   Tobacco Use  Smoking Status Never  Smokeless Tobacco Never    Goals Met:  Independence with exercise equipment Exercise tolerated well No report of concerns or symptoms today Strength training completed today  Goals Unmet:  Not Applicable  Comments:  Near the end of the 2nd exercise session, the pt had chest pain 7/10 and felt lightheaded.  She stopped exercising and was placed in a chair to check her blood sugar.  The pt had taken her insulin pump off at arrival to exercise.  At 15:47 her blood sugar was 22.  The pt was given 2 orange juices and drank 15 grams of glucose.  Rechecked at 16:02 and her blood sugar was 75.  RN tried to get patient to eat crackers before leaving, but pt stated she had crackers in her car and was ready to leave.  Chest pain and  lightheadedness resolved before pt left.

## 2023-10-12 ENCOUNTER — Encounter (HOSPITAL_COMMUNITY): Payer: Medicare Other

## 2023-10-12 ENCOUNTER — Encounter: Payer: Self-pay | Admitting: Allergy & Immunology

## 2023-10-12 ENCOUNTER — Ambulatory Visit: Payer: Medicare Other | Admitting: Allergy & Immunology

## 2023-10-12 VITALS — BP 130/60 | HR 78 | Temp 97.2°F | Resp 14 | Ht 66.5 in | Wt 262.2 lb

## 2023-10-12 DIAGNOSIS — J3089 Other allergic rhinitis: Secondary | ICD-10-CM | POA: Diagnosis not present

## 2023-10-12 DIAGNOSIS — M048 Other autoinflammatory syndromes: Secondary | ICD-10-CM | POA: Diagnosis not present

## 2023-10-12 DIAGNOSIS — E109 Type 1 diabetes mellitus without complications: Secondary | ICD-10-CM | POA: Diagnosis not present

## 2023-10-12 DIAGNOSIS — M359 Systemic involvement of connective tissue, unspecified: Secondary | ICD-10-CM | POA: Diagnosis not present

## 2023-10-12 DIAGNOSIS — Z8269 Family history of other diseases of the musculoskeletal system and connective tissue: Secondary | ICD-10-CM | POA: Diagnosis not present

## 2023-10-12 DIAGNOSIS — J302 Other seasonal allergic rhinitis: Secondary | ICD-10-CM

## 2023-10-12 DIAGNOSIS — Z1379 Encounter for other screening for genetic and chromosomal anomalies: Secondary | ICD-10-CM | POA: Diagnosis not present

## 2023-10-12 DIAGNOSIS — Z832 Family history of diseases of the blood and blood-forming organs and certain disorders involving the immune mechanism: Secondary | ICD-10-CM | POA: Diagnosis not present

## 2023-10-12 NOTE — Progress Notes (Signed)
 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 Suszanne Conners and receives vestibular therapy    Plan/Recommendations:   1. Anaphylaxis from allergen immunotherapy - We will continue with the allergy shot at the same schedule.  - You have reached maintenance at least, so good job there!  - Continue with cetirizine daily and Benadryl prior to shots in the evening.  - Your end date would be September 2028.  2. Return in about 1 year (around 10/11/2024). You can have the follow up appointment with Dr. Dellis Anes or a Nurse Practicioner (our Nurse Practitioners are excellent and always have Physician oversight!).   Subjective:   LYSA LIVENGOOD is a 63 y.o. female presenting today for follow up of  Chief Complaint  Patient presents with   Follow-up    RESHUNDA STRIDER has a history of the following: Patient Active Problem List   Diagnosis Date Noted   Chronic eczematous otitis externa of both ears 07/15/2023   Impacted cerumen of both ears 07/15/2023   Nausea without vomiting 12/28/2022   Generalized abdominal pain 12/28/2022   Constipation 12/28/2022   Seasonal and perennial allergic rhinitis 07/13/2021   Diabetic ketoacidosis without coma associated with type 1 diabetes mellitus (HCC) 11/15/2019   Class 1 obesity 11/15/2019   Acute renal failure superimposed on stage 3 chronic kidney disease (HCC) 11/15/2019   CAD (coronary artery disease) 04/16/2018   Unstable angina (HCC) 03/21/2018   Atypical chest pain 03/20/2018   Gastritis and  gastroduodenitis 03/05/2017   Suprapubic pain 03/05/2017   Right sided abdominal pain 10/24/2016   Diarrhea 10/24/2016   Family hx of colon cancer 10/24/2016   Varicose veins of bilateral lower extremities with other complications 05/30/2016   Insomnia with sleep apnea 03/09/2016   Type I diabetes mellitus, uncontrolled 04/30/2015   Mixed hyperlipidemia 04/30/2015   Primary hypothyroidism 04/30/2015   Benign hypertension 04/30/2015   OSA on CPAP 03/10/2015   Migraine without aura and without status migrainosus, not intractable 01/08/2015   Primary stabbing headache 10/02/2014    History obtained from: chart review and patient.  Discussed the use of AI scribe software for clinical note transcription with the patient and/or guardian, who gave verbal consent to proceed.  Asaiah is a 63 y.o. female presenting for a follow up visit.  We last saw her in October 2024 after she administered epinephrine following an allergy shot.  At that time, we decreased her dose to 0.1 mL of each vial and recommended repeating each dose before adding schedule A.    Since the last visit, she has done well and is now back to 0.5 mL of her Red Vial for each.   Allergic Rhinitis Symptom History: She is up to her maintenance dose for her allergy shots. She experiences swelling around the injection site and carries an EpiPen, which she has used a couple of times. She takes Zyrtec and Benadryl for her allergies, with Benadryl taken at night. Her allergy symptoms have improved over the years with this regimen.   Contessa is on allergen immunotherapy. She  receives two injections. Immunotherapy script #1 contains trees, weeds, grasses, cat, and dog. She currently receives 0.32mL of the RED vial (1/100). Immunotherapy script #2 contains molds and dust mites. She currently receives 0.70mL of the RED vial (1/100). She started shots January of 2023 and reached maintenance September 2023. She does have epinephrine rinses. Epi  rinses have helped a lot to help her tolerate her injections.   She has a history of cardiac issues and has returned to cardiac rehabilitation due to experiencing angina. No blockage in her heart is reported, but she experiences premature ventricular contractions (PVCs) during physical activity, particularly when using a standing elliptical. She is proactive in managing her cardiac health and is currently participating in cardiac rehabilitation.  She has been traveling frequently to [facility] due to a family member's illness, which has impacted her ability to maintain her allergy shot schedule. Her niece recently passed away from stomach cancer at the age of 52, and she has been making trips to [place] every other day for the past few weeks. Her niece did not have any children.   No recent sinus or ear infections are noted, but she reports a nasal drip due to seasonal changes. Her kidney function is reportedly doing well according to her nephrologist.   Otherwise, there have been no changes to her past medical history, surgical history, family history, or social history.    Review of systems otherwise negative other than that mentioned in the HPI.    Objective:   Blood pressure 130/60, pulse 78, temperature (!) 97.2 F (36.2 C), resp. rate 14, height 5' 6.5" (1.689 m), weight 262 lb 4 oz (119 kg), SpO2 97%. Body mass index is 41.69 kg/m.    Physical Exam Vitals reviewed.  Constitutional:      Appearance: She is well-developed and overweight.     Comments: Very talkative.  Making full sentences. Seems more subdued.   HENT:     Head: Normocephalic and atraumatic.     Right Ear: Tympanic membrane, ear canal and external ear normal. No drainage, swelling or tenderness. Tympanic membrane is not injected, scarred, erythematous, retracted or bulging.     Left Ear: Tympanic membrane, ear canal and external ear normal. No drainage, swelling or tenderness. Tympanic membrane is not injected,  scarred, erythematous, retracted or bulging.     Nose: Mucosal edema and rhinorrhea present. No nasal deformity or septal deviation.     Right Turbinates: Enlarged, swollen and pale.     Left Turbinates: Enlarged, swollen and pale.     Right Sinus: No maxillary sinus tenderness or frontal sinus tenderness.     Left Sinus: No maxillary sinus tenderness or frontal sinus tenderness.     Comments: Clear rhinorrhea bilaterally. No nasal polyps.     Mouth/Throat:     Lips: Pink.     Mouth: Mucous membranes are moist. Mucous membranes are not pale and not dry.     Pharynx: Uvula midline.     Comments: Cobblestoning in the posterior oropharynx.  Eyes:     General:        Right eye: No discharge.        Left eye: No discharge.     Conjunctiva/sclera: Conjunctivae normal.     Right eye: Right conjunctiva is not injected. No chemosis.    Left eye: Left conjunctiva is not injected. No chemosis.    Pupils: Pupils are equal, round, and reactive to light.  Cardiovascular:     Rate and Rhythm: Normal rate  and regular rhythm.     Heart sounds: Normal heart sounds.  Pulmonary:     Effort: Pulmonary effort is normal. No tachypnea, accessory muscle usage or respiratory distress.     Breath sounds: Decreased air movement present. No wheezing, rhonchi or rales.     Comments: Decreased air movement at the bases.  Chest:     Chest wall: No tenderness.  Lymphadenopathy:     Head:     Right side of head: No submandibular, tonsillar or occipital adenopathy.     Left side of head: No submandibular, tonsillar or occipital adenopathy.     Cervical: No cervical adenopathy.  Skin:    Coloration: Skin is not pale.     Findings: No abrasion, erythema, petechiae or rash. Rash is not papular, urticarial or vesicular.  Neurological:     Mental Status: She is alert.  Psychiatric:        Behavior: Behavior is cooperative.      Diagnostic studies: none      Malachi Bonds, MD  [facility]

## 2023-10-12 NOTE — Patient Instructions (Addendum)
 1. Anaphylaxis from allergen immunotherapy - We will continue with the allergy shot at the same schedule.  - You have reached maintenance at least, so good job there!  - Continue with cetirizine daily and Benadryl prior to shots in the evening.  - Your end date would be September 2028.  2. Return in about 1 year (around 10/11/2024). You can have the follow up appointment with Dr. Dellis Anes or a Nurse Practicioner (our Nurse Practitioners are excellent and always have Physician oversight!).    Please inform us of any Emergency Department visits, hospitalizations, or changes in symptoms. Call us before going to the ED for breathing or allergy symptoms since we might be able to fit you in for a sick visit. Feel free to contact us anytime with any questions, problems, or concerns.  It was a pleasure to see you again today! I am so sorry to hear about your niece. That is terrible.   Websites that have reliable patient information: 1. American Academy of Asthma, Allergy, and Immunology: www.aaaai.org 2. Food Allergy Research and Education (FARE): foodallergy.org 3. Mothers of Asthmatics: http://www.asthmacommunitynetwork.org 4. American College of Allergy, Asthma, and Immunology: www.acaai.org      "Like" [place] on Facebook and Instagram for our latest updates!      A healthy democracy works best when [employer] participate! Make sure you are registered to vote! If you have moved or changed any of your contact information, you will need to get this updated before voting! Scan the QR codes below to learn more!

## 2023-10-15 ENCOUNTER — Encounter (HOSPITAL_COMMUNITY)
Admission: RE | Admit: 2023-10-15 | Discharge: 2023-10-15 | Disposition: A | Discharge status: Home / Self Care | Payer: Medicare Other | Source: Ambulatory Visit | Attending: Cardiology | Admitting: Cardiology

## 2023-10-15 DIAGNOSIS — I2089 Other forms of angina pectoris: Secondary | ICD-10-CM

## 2023-10-15 NOTE — Progress Notes (Signed)
 Daily Session Note  Patient Details  Name: Jodi Ray MRN: [id number] Date of Birth: 1960/10/15 Referring Provider:   Flowsheet Row CARDIAC REHAB PHASE II ORIENTATION from 09/10/2023 in [facility] CARDIAC REHABILITATION  Referring Provider Dina Rich MD       Encounter Date: 10/15/2023  Check In:  Session Check In - 10/15/23 0804       Check-In   Supervising physician immediately available to respond to emergencies See telemetry face sheet for immediately available MD    Location [facility]    Staff Present Ross Ludwig, BS, Exercise Physiologist;Brittany Roseanne Reno, BSN, RN, WTA-C    Virtual Visit No    Medication changes reported     No    Fall or balance concerns reported    No    Tobacco Cessation No Change    Warm-up and Cool-down Performed on first and last piece of equipment    Resistance Training Performed Yes    VAD Patient? No    PAD/SET Patient? No      Pain Assessment   Currently in Pain? No/denies    Multiple Pain Sites No             Capillary Blood Glucose: No results found for this or any previous visit (from the past 24 hours).    Social History   Tobacco Use  Smoking Status Never  Smokeless Tobacco Never    Goals Met:  Independence with exercise equipment Exercise tolerated well No report of concerns or symptoms today Strength training completed today  Goals Unmet:  Not Applicable  Comments: Pt able to follow exercise prescription today without complaint.  Will continue to monitor for progression.

## 2023-10-17 ENCOUNTER — Ambulatory Visit (INDEPENDENT_AMBULATORY_CARE_PROVIDER_SITE_OTHER): Payer: Self-pay

## 2023-10-17 ENCOUNTER — Encounter (HOSPITAL_COMMUNITY)
Admission: RE | Admit: 2023-10-17 | Discharge: 2023-10-17 | Disposition: A | Discharge status: Home / Self Care | Payer: Medicare Other | Source: Ambulatory Visit | Attending: Cardiology

## 2023-10-17 DIAGNOSIS — I2089 Other forms of angina pectoris: Secondary | ICD-10-CM | POA: Diagnosis not present

## 2023-10-17 DIAGNOSIS — J309 Allergic rhinitis, unspecified: Secondary | ICD-10-CM | POA: Diagnosis not present

## 2023-10-17 NOTE — Progress Notes (Signed)
 Daily Session Note  Patient Details  Name: Jodi Ray MRN: [id number] Date of Birth: May 15, 1961 Referring Provider:   Flowsheet Row CARDIAC REHAB PHASE II ORIENTATION from 09/10/2023 in [facility] CARDIAC REHABILITATION  Referring Provider Dina Rich MD       Encounter Date: 10/17/2023  Check In:  Session Check In - 10/17/23 0800       Check-In   Supervising physician immediately available to respond to emergencies See telemetry face sheet for immediately available MD    Location [facility]    Staff Present Fabio Pierce, MA, RCEP, CCRP, CCET;Hillary Troutman BSN, RN    Virtual Visit No    Medication changes reported     No    Fall or balance concerns reported    No    Warm-up and Cool-down Performed on first and last piece of equipment    Resistance Training Performed Yes    VAD Patient? No    PAD/SET Patient? No      Pain Assessment   Currently in Pain? No/denies             Capillary Blood Glucose: No results found for this or any previous visit (from the past 24 hours).    Social History   Tobacco Use  Smoking Status Never  Smokeless Tobacco Never    Goals Met:  Independence with exercise equipment Exercise tolerated well No report of concerns or symptoms today Strength training completed today  Goals Unmet:  Not Applicable  Comments: Pt able to follow exercise prescription today without complaint.  Will continue to monitor for progression.

## 2023-10-19 ENCOUNTER — Encounter (HOSPITAL_COMMUNITY)
Admission: RE | Admit: 2023-10-19 | Discharge: 2023-10-19 | Disposition: A | Discharge status: Home / Self Care | Payer: Medicare Other | Source: Ambulatory Visit | Attending: Cardiology | Admitting: Cardiology

## 2023-10-19 DIAGNOSIS — I2089 Other forms of angina pectoris: Secondary | ICD-10-CM | POA: Diagnosis not present

## 2023-10-19 NOTE — Progress Notes (Signed)
 Daily Session Note  Patient Details  Name: NAFISA OLDS MRN: [id number] Date of Birth: 01/19/1961 Referring Provider:   Flowsheet Row CARDIAC REHAB PHASE II ORIENTATION from 09/10/2023 in [facility] CARDIAC REHABILITATION  Referring Provider Dina Rich MD       Encounter Date: 10/19/2023  Check In:  Session Check In - 10/19/23 0756       Check-In   Supervising physician immediately available to respond to emergencies See telemetry face sheet for immediately available MD    Location [facility]    Staff Present Ross Ludwig, BS, Exercise Physiologist;Debra Laural Benes, RN, BSN;Albie Arizpe Juanetta Gosling, MA, RCEP, CCRP, CCET    Virtual Visit No    Medication changes reported     No    Warm-up and Cool-down Performed on first and last piece of equipment    Resistance Training Performed Yes    VAD Patient? No    PAD/SET Patient? No      Pain Assessment   Currently in Pain? Yes             Capillary Blood Glucose: No results found for this or any previous visit (from the past 24 hours).    Social History   Tobacco Use  Smoking Status Never  Smokeless Tobacco Never    Goals Met:  Independence with exercise equipment Exercise tolerated well No report of concerns or symptoms today Strength training completed today  Goals Unmet:  Not Applicable  Comments: Pt able to follow exercise prescription today without complaint.  Will continue to monitor for progression.

## 2023-10-22 ENCOUNTER — Encounter (HOSPITAL_COMMUNITY)
Admission: RE | Admit: 2023-10-22 | Discharge: 2023-10-22 | Disposition: A | Discharge status: Home / Self Care | Payer: Medicare Other | Source: Ambulatory Visit | Attending: Cardiology

## 2023-10-22 DIAGNOSIS — I2089 Other forms of angina pectoris: Secondary | ICD-10-CM

## 2023-10-22 NOTE — Progress Notes (Signed)
 Daily Session Note  Patient Details  Name: Jodi Ray MRN: [id number] Date of Birth: Sep 22, 1960 Referring Provider:   Flowsheet Row CARDIAC REHAB PHASE II ORIENTATION from 09/10/2023 in [facility] CARDIAC REHABILITATION  Referring Provider Dina Rich MD       Encounter Date: 10/22/2023  Check In:  Session Check In - 10/22/23 0817       Check-In   Supervising physician immediately available to respond to emergencies See telemetry face sheet for immediately available MD    Location [facility]    Staff Present Fabio Pierce, MA, RCEP, CCRP, Dow Adolph, RN, BSN    Virtual Visit No    Medication changes reported     No    Fall or balance concerns reported    No    Warm-up and Cool-down Performed on first and last piece of equipment    Resistance Training Performed Yes    VAD Patient? No    PAD/SET Patient? No      Pain Assessment   Currently in Pain? No/denies             Capillary Blood Glucose: No results found for this or any previous visit (from the past 24 hours).    Social History   Tobacco Use  Smoking Status Never  Smokeless Tobacco Never    Goals Met:  Independence with exercise equipment Exercise tolerated well No report of concerns or symptoms today Strength training completed today  Goals Unmet:  Not Applicable  Comments: Pt able to follow exercise prescription today without complaint.  Will continue to monitor for progression.

## 2023-10-24 ENCOUNTER — Encounter (HOSPITAL_COMMUNITY)
Admission: RE | Admit: 2023-10-24 | Discharge: 2023-10-24 | Disposition: A | Discharge status: Home / Self Care | Payer: Medicare Other | Source: Ambulatory Visit | Attending: Cardiology

## 2023-10-24 ENCOUNTER — Encounter (HOSPITAL_COMMUNITY): Payer: Self-pay | Admitting: *Deleted

## 2023-10-24 ENCOUNTER — Ambulatory Visit (INDEPENDENT_AMBULATORY_CARE_PROVIDER_SITE_OTHER): Payer: Self-pay

## 2023-10-24 DIAGNOSIS — Z803 Family history of malignant neoplasm of breast: Secondary | ICD-10-CM | POA: Diagnosis not present

## 2023-10-24 DIAGNOSIS — Z8507 Personal history of malignant neoplasm of pancreas: Secondary | ICD-10-CM | POA: Diagnosis not present

## 2023-10-24 DIAGNOSIS — I2089 Other forms of angina pectoris: Secondary | ICD-10-CM | POA: Diagnosis not present

## 2023-10-24 DIAGNOSIS — J309 Allergic rhinitis, unspecified: Secondary | ICD-10-CM

## 2023-10-24 DIAGNOSIS — Z800 Family history of malignant neoplasm of digestive organs: Secondary | ICD-10-CM | POA: Diagnosis not present

## 2023-10-24 NOTE — Progress Notes (Signed)
 Daily Session Note  Patient Details  Name: DRINDA BELGARD MRN: [id number] Date of Birth: 1961/01/11 Referring Provider:   Flowsheet Row CARDIAC REHAB PHASE II ORIENTATION from 09/10/2023 in [facility] CARDIAC REHABILITATION  Referring Provider Dina Rich MD       Encounter Date: 10/24/2023  Check In:  Session Check In - 10/24/23 0745       Check-In   Supervising physician immediately available to respond to emergencies See telemetry face sheet for immediately available MD    Location [facility]    Staff Present Avanell Shackleton BSN, RN;Heather Fredric Mare, [place], Exercise Physiologist    Virtual Visit No    Medication changes reported     No    Fall or balance concerns reported    No    Tobacco Cessation No Change    Warm-up and Cool-down Performed on first and last piece of equipment    Resistance Training Performed Yes    VAD Patient? No    PAD/SET Patient? No      Pain Assessment   Currently in Pain? No/denies    Multiple Pain Sites No             Capillary Blood Glucose: No results found for this or any previous visit (from the past 24 hours).    Social History   Tobacco Use  Smoking Status Never  Smokeless Tobacco Never    Goals Met:  Independence with exercise equipment Exercise tolerated well No report of concerns or symptoms today Strength training completed today  Goals Unmet:  Not Applicable  Comments: Marland KitchenMarland KitchenPt able to follow exercise prescription today without complaint.  Will continue to monitor for progression.

## 2023-10-24 NOTE — Progress Notes (Signed)
 Cardiac Individual Treatment Plan  Patient Details  Name: Jodi Ray MRN: [id number] Date of Birth: 1960-09-05 Referring Provider:   Flowsheet Row CARDIAC REHAB PHASE II ORIENTATION from 09/10/2023 in [facility] CARDIAC REHABILITATION  Referring Provider Dina Rich MD       Initial Encounter Date:  Flowsheet Row CARDIAC REHAB PHASE II ORIENTATION from 09/10/2023 in [place] CARDIAC REHABILITATION  Date 09/10/23       Visit Diagnosis: Stable angina (HCC)  Patient's Home Medications on Admission:  Current Outpatient Medications:    ALPRAZolam (XANAX) 0.5 MG tablet, Take 0.5 mg by mouth once a week., Disp: , Rfl:    aspirin EC 81 MG tablet, Take 1 tablet (81 mg total) by mouth daily. Swallow whole., Disp: 90 tablet, Rfl: 3   atorvastatin (LIPITOR) 80 MG tablet, TAKE 1 TABLET BY MOUTH ONCE DAILY AT  6PM, Disp: 90 tablet, Rfl: 3   calcitRIOL (ROCALTROL) 0.25 MCG capsule, Take 0.25 mcg by mouth every Monday, Wednesday, and Friday., Disp: , Rfl:    carvedilol (COREG) 6.25 MG tablet, Take 1.5 tablets (9.375 mg total) by mouth 2 (two) times daily., Disp: 270 tablet, Rfl: 3   Continuous Blood Gluc Sensor (DEXCOM G6 SENSOR) MISC, 1 Device by Other route as directed., Disp: , Rfl:    EPINEPHRINE 0.3 mg/0.3 mL IJ SOAJ injection, INJECT CONTENTS OF ONE PEN INTO THE MUSCLE AS NEEDED FOR ANAPHYLAXIS, Disp: 2 each, Rfl: 0   furosemide (LASIX) 20 MG tablet, Take 20-40 mg by mouth See admin instructions. Take 2 tablets in the morning and 2 tablet in the evening, Disp: , Rfl:    hydrALAZINE (APRESOLINE) 25 MG tablet, Take 1 tablet (25 mg total) by mouth 3 (three) times daily., Disp: 270 tablet, Rfl: 3   Insulin Human (INSULIN PUMP) SOLN, Inject into the skin continuous. , Disp: , Rfl:    Insulin Pen Needle (PEN NEEDLES 31GX5/16") 31G X 8 MM MISC, , Disp: , Rfl:    JARDIANCE 10 MG TABS tablet, Take 5 mg by mouth every Monday, Wednesday, and Friday., Disp: , Rfl:    KERENDIA 10 MG TABS,  Take 10 mg by mouth every Monday, Wednesday, and Friday., Disp: , Rfl:    Lactulose 20 GM/30ML SOLN, Take 15 mLs (10 g total) by mouth 2 (two) times daily., Disp: 900 mL, Rfl: 3   levothyroxine (SYNTHROID) 112 MCG tablet, Take 112 mcg by mouth daily before breakfast., Disp: , Rfl:    lubiprostone (AMITIZA) 24 MCG capsule, Take 1 capsule (24 mcg total) by mouth 2 (two) times daily with a meal., Disp: 60 capsule, Rfl: 11   metFORMIN (GLUCOPHAGE) 500 MG tablet, Take 1 tablet (500 mg total) by mouth 2 (two) times daily with a meal., Disp: , Rfl:    nitroGLYCERIN (NITROSTAT) 0.4 MG SL tablet, Place 1 tablet (0.4 mg total) under the tongue every 5 (five) minutes as needed for chest pain., Disp: 25 tablet, Rfl: 3   NOVOLOG 100 UNIT/ML injection, Inject into the skin continuous. In insulin pump, Disp: , Rfl: 0   pantoprazole (PROTONIX) 40 MG tablet, TAKE 1 TABLET BY MOUTH TWICE DAILY BEFORE A MEAL, Disp: 60 tablet, Rfl: 0   ranolazine (RANEXA) 1000 MG SR tablet, Take 1 tablet (1,000 mg total) by mouth 2 (two) times daily. (Patient not taking: Reported on 10/12/2023), Disp: 180 tablet, Rfl: 3   verapamil (CALAN) 80 MG tablet, Take 1 tablet (80 mg total) by mouth 3 (three) times daily., Disp: 90 tablet, Rfl: 5  Past Medical History: Past Medical History:  Diagnosis Date   Adult RDS (HCC)    Anemia    Brain aneurysm    frontal lobe   CAD (coronary artery disease)    a. 02/2018: s/p DES to Proximal LAD with residual 20% RCA stenosis.   Chronic pain    Diabetes mellitus without complication (HCC)    Headache    History of left bundle branch block (LBBB)    HTN (hypertension)    Hx of cardiovascular stress test 10/2016   intermediate risk study   Hypothyroidism    Neuromuscular disorder (HCC)    Neuropathy    Obesity    OSA on CPAP 03/10/2015   Renal insufficiency    Retinopathy    Sleep apnea    Varicose veins of both lower extremities     Tobacco Use: Social History   Tobacco Use   Smoking Status Never  Smokeless Tobacco Never    Labs: Review Flowsheet  More data exists      Latest Ref Rng & Units 07/23/2015 03/20/2018 04/16/2018 11/15/2019 06/28/2022  Labs for ITP Cardiac and Pulmonary Rehab  Cholestrol 0 - 200 mg/dL - - 284  - -  LDL (calc) 0 - 99 mg/dL - - 54  - -  HDL-C >13 mg/dL - - 75  - -  Trlycerides <150 mg/dL - - 23  - -  Hemoglobin A1c 4.8 - 5.6 % 9.1  7.8  - 7.8  6.3   Bicarbonate 20.0 - 28.0 mmol/L - - - 13.2  -  Acid-base deficit 0.0 - 2.0 mmol/L - - - 12.4  -  O2 Saturation % - - - 31.8  -    Capillary Blood Glucose: Lab Results  Component Value Date   GLUCAP 75 10/11/2023   GLUCAP 22 (LL) 10/11/2023   GLUCAP 77 09/19/2023   GLUCAP 93 09/19/2023   GLUCAP 86 09/18/2023     Exercise Target Goals: Exercise Program Goal: Individual exercise prescription set using results from initial 6 min walk test and THRR while considering  patient's activity barriers and safety.   Exercise Prescription Goal: Starting with aerobic activity 30 plus minutes a day, 3 days per week for initial exercise prescription. Provide home exercise prescription and guidelines that participant acknowledges understanding prior to discharge.  Activity Barriers & Risk Stratification:  Activity Barriers & Cardiac Risk Stratification - 09/05/23 1307       Activity Barriers & Cardiac Risk Stratification   Activity Barriers History of Falls;Balance Concerns;Chest Pain/Angina;Assistive Device;Back Problems;Deconditioning;Shortness of Breath    Cardiac Risk Stratification High             6 Minute Walk:  6 Minute Walk     Row Name 09/10/23 0943         6 Minute Walk   Phase Initial     Distance 810 feet     Walk Time 6 minutes     # of Rest Breaks 1     MPH 1.53     METS 1.53     RPE 12     Perceived Dyspnea  1     VO2 Peak 5.37     Symptoms Yes (comment)     Comments one standing 30 second break     Resting HR 72 bpm     Resting BP 120/60      Resting Oxygen Saturation  96 %     Exercise Oxygen Saturation  during 6 min walk 97 %  Max Ex. HR 2.44 bpm     Max Ex. BP 156/64     2 Minute Post BP 140/60              Oxygen Initial Assessment:   Oxygen Re-Evaluation:   Oxygen Discharge (Final Oxygen Re-Evaluation):   Initial Exercise Prescription:  Initial Exercise Prescription - 09/10/23 0900       Date of Initial Exercise RX and Referring Provider   Date 09/10/23    Referring Provider Dina Rich MD      NuStep   Level 2    SPM 50    Minutes 15    METs 2      Recumbant Elliptical   Level 2    RPM 50    Minutes 15    METs 3      Prescription Details   Frequency (times per week) 3    Duration Progress to 30 minutes of continuous aerobic without signs/symptoms of physical distress      Intensity   THRR 40-80% of Max Heartrate 106-139    Ratings of Perceived Exertion 11-13    Perceived Dyspnea 0-4      Resistance Training   Training Prescription Yes    Weight 3    Reps 10-15             Perform Capillary Blood Glucose checks as needed.  Exercise Prescription Changes:   Exercise Prescription Changes     Row Name 09/10/23 0900 09/19/23 1000 10/01/23 1200 10/05/23 0800 10/15/23 1500     Response to Exercise   Blood Pressure (Admit) 120/60 158/64 112/70 -- 110/70   Blood Pressure (Exercise) 156/64 178/80 140/70 -- --   Blood Pressure (Exit) 140/60 132/68 120/70 -- 112/56   Heart Rate (Admit) 72 bpm 90 bpm 93 bpm -- 90 bpm   Heart Rate (Exercise) 94 bpm 119 bpm 96 bpm -- 107 bpm   Heart Rate (Exit) 76 bpm 99 bpm 78 bpm -- 83 bpm   Oxygen Saturation (Admit) 96 % -- -- -- --   Oxygen Saturation (Exercise) 97 % -- -- -- --   Oxygen Saturation (Exit) 97 % -- -- -- --   Rating of Perceived Exertion (Exercise) 12 11 12  -- 11   Perceived Dyspnea (Exercise) 1 -- -- -- --   Duration Progress to 30 minutes of  aerobic without signs/symptoms of physical distress Continue with 30 min of  aerobic exercise without signs/symptoms of physical distress. Continue with 30 min of aerobic exercise without signs/symptoms of physical distress. -- Continue with 30 min of aerobic exercise without signs/symptoms of physical distress.   Intensity -- THRR unchanged THRR unchanged -- THRR unchanged     Progression   Progression -- Continue to progress workloads to maintain intensity without signs/symptoms of physical distress. Continue to progress workloads to maintain intensity without signs/symptoms of physical distress. -- Continue to progress workloads to maintain intensity without signs/symptoms of physical distress.     Resistance Training   Training Prescription -- Yes Yes -- Yes   Weight -- 5 5 -- 5   Reps -- 10-15 10-15 -- 10-15     NuStep   Level -- 4 5 -- 5   SPM -- 56 117 -- 138   Minutes -- 15 15 -- 15   METs -- 2.1 2.4 -- 2.3     Recumbant Elliptical   Level -- 4 4 -- 5   RPM -- 122 48 -- 47   Minutes --  15 15 -- 15   METs -- 2.5 3.1 -- 4.3     Home Exercise Plan   Plans to continue exercise at -- -- -- Home (comment)  walking, treadmill, bike Home (comment)   Frequency -- -- -- Add 2 additional days to program exercise sessions. Add 2 additional days to program exercise sessions.   Initial Home Exercises Provided -- -- -- 10/05/23 --            Exercise Comments:   Exercise Comments     Row Name 10/10/23 0803           Exercise Comments Olegario Messier is sympotmatic with frequent PVCs with SOB.  Dr. Wyline Mood increased her coreg that she will pick up today.                Exercise Goals and Review:   Exercise Goals     Row Name 09/10/23 0948             Exercise Goals   Increase Physical Activity Yes       Intervention Provide advice, education, support and counseling about physical activity/exercise needs.;Develop an individualized exercise prescription for aerobic and resistive training based on initial evaluation findings, risk stratification,  comorbidities and participant's personal goals.       Expected Outcomes Short Term: Attend rehab on a regular basis to increase amount of physical activity.;Long Term: Add in home exercise to make exercise part of routine and to increase amount of physical activity.;Long Term: Exercising regularly at least 3-5 days a week.       Increase Strength and Stamina Yes       Intervention Provide advice, education, support and counseling about physical activity/exercise needs.;Develop an individualized exercise prescription for aerobic and resistive training based on initial evaluation findings, risk stratification, comorbidities and participant's personal goals.       Expected Outcomes Short Term: Increase workloads from initial exercise prescription for resistance, speed, and METs.;Short Term: Perform resistance training exercises routinely during rehab and add in resistance training at home;Long Term: Improve cardiorespiratory fitness, muscular endurance and strength as measured by increased METs and functional capacity ( )       Able to understand and use rate of perceived exertion (RPE) scale Yes       Intervention Provide education and explanation on how to use RPE scale       Expected Outcomes Short Term: Able to use RPE daily in rehab to express subjective intensity level;Long Term:  Able to use RPE to guide intensity level when exercising independently       Able to understand and use Dyspnea scale Yes       Intervention Provide education and explanation on how to use Dyspnea scale       Expected Outcomes Short Term: Able to use Dyspnea scale daily in rehab to express subjective sense of shortness of breath during exertion;Long Term: Able to use Dyspnea scale to guide intensity level when exercising independently       Knowledge and understanding of Target Heart Rate Range (THRR) Yes       Intervention Provide education and explanation of THRR including how the numbers were predicted and where they  are located for reference       Expected Outcomes Short Term: Able to state/look up THRR;Long Term: Able to use THRR to govern intensity when exercising independently;Short Term: Able to use daily as guideline for intensity in rehab       Able to check pulse  independently Yes       Intervention Provide education and demonstration on how to check pulse in carotid and radial arteries.;Review the importance of being able to check your own pulse for safety during independent exercise       Expected Outcomes Long Term: Able to check pulse independently and accurately;Short Term: Able to explain why pulse checking is important during independent exercise       Understanding of Exercise Prescription Yes       Intervention Provide education, explanation, and written materials on patient's individual exercise prescription       Expected Outcomes Short Term: Able to explain program exercise prescription;Long Term: Able to explain home exercise prescription to exercise independently                Exercise Goals Re-Evaluation :  Exercise Goals Re-Evaluation     Row Name 09/19/23 1002 10/05/23 0813           Exercise Goal Re-Evaluation   Exercise Goals Review Increase Physical Activity;Increase Strength and Stamina;Understanding of Exercise Prescription Increase Physical Activity;Increase Strength and Stamina;Able to understand and use Dyspnea scale;Able to understand and use rate of perceived exertion (RPE) scale;Knowledge and understanding of Target Heart Rate Range (THRR);Able to check pulse independently;Understanding of Exercise Prescription      Comments Olegario Messier is doijg well in rehab and tolerating exercise well. She has recently noticed her angina the past couple days when exercisng and is going to ask her cardiologist about taking her Nitro. Will continue to monitor and update as able. Olegario Messier is doing well in rehab. She is working on pushing through her angina and we have a plan she pushes to 6/10  and then backs off or rest to recover then go again. Reviewed home exercise with pt today.  Pt plans to walk with dog at home for exercise.  She also has a treadmill and stationary bike at home as well.  Reviewed THR, pulse, RPE, sign and symptoms, pulse oximetery and when to call 911 or MD.  Also discussed weather considerations and indoor options.  Pt voiced understanding.      Expected Outcomes continue to attend rehab SHort; Start to add in more at home Long; Continue to push anginal threshold                Discharge Exercise Prescription (Final Exercise Prescription Changes):  Exercise Prescription Changes - 10/15/23 1500       Response to Exercise   Blood Pressure (Admit) 110/70    Blood Pressure (Exit) 112/56    Heart Rate (Admit) 90 bpm    Heart Rate (Exercise) 107 bpm    Heart Rate (Exit) 83 bpm    Rating of Perceived Exertion (Exercise) 11    Duration Continue with 30 min of aerobic exercise without signs/symptoms of physical distress.    Intensity THRR unchanged      Progression   Progression Continue to progress workloads to maintain intensity without signs/symptoms of physical distress.      Resistance Training   Training Prescription Yes    Weight 5    Reps 10-15      NuStep   Level 5    SPM 138    Minutes 15    METs 2.3      Recumbant Elliptical   Level 5    RPM 47    Minutes 15    METs 4.3      Home Exercise Plan   Plans to continue exercise at  Home (comment)    Frequency Add 2 additional days to program exercise sessions.             Nutrition:  Target Goals: Understanding of nutrition guidelines, daily intake of sodium 1500mg , cholesterol 200mg , calories 30% from fat and 7% or less from saturated fats, daily to have 5 or more servings of fruits and vegetables.  Biometrics:  Pre Biometrics - 09/10/23 0949       Pre Biometrics   Height 5' 6.5" (1.689 m)    Weight 272 lb 14.9 oz (123.8 kg)    Waist Circumference 46 inches    Hip  Circumference 54 inches    Waist to Hip Ratio 0.85 %    BMI (Calculated) 43.4    Grip Strength 28 kg              Nutrition Therapy Plan and Nutrition Goals:   Nutrition Assessments:  MEDIFICTS Score Key: >=70 Need to make dietary changes  40-70 Heart Healthy Diet <= 40 Therapeutic Level Cholesterol Diet  Flowsheet Row CARDIAC REHAB PHASE II EXERCISE from 09/14/2023 in [facility] CARDIAC REHABILITATION  Picture Your Plate Total Score on Admission 7      Picture Your Plate Scores: <59 Unhealthy dietary pattern with much room for improvement. 41-50 Dietary pattern unlikely to meet recommendations for good health and room for improvement. 51-60 More healthful dietary pattern, with some room for improvement.  >60 Healthy dietary pattern, although there may be some specific behaviors that could be improved.    Nutrition Goals Re-Evaluation:  Nutrition Goals Re-Evaluation     Row Name 10/05/23 0823             Goals   Nutrition Goal Heart Healthy Diet       Comment Olegario Messier meets with a dietitian every month at her endocrinologist office.  She eats healthy and tries to stick to it.  She eats a good variety of fruits and vegetables.  She has been watching her kidneys too so we will limit her protein.  She is pleased with how well she is eating and watching her portions.       Expected Outcome Short: Conitnue to meet Long; Continue to eat healthy.                Nutrition Goals Discharge (Final Nutrition Goals Re-Evaluation):  Nutrition Goals Re-Evaluation - 10/05/23 0823       Goals   Nutrition Goal Heart Healthy Diet    Comment Olegario Messier meets with a dietitian every month at her endocrinologist office.  She eats healthy and tries to stick to it.  She eats a good variety of fruits and vegetables.  She has been watching her kidneys too so we will limit her protein.  She is pleased with how well she is eating and watching her portions.    Expected Outcome Short: Conitnue  to meet Long; Continue to eat healthy.             Psychosocial: Target Goals: Acknowledge presence or absence of significant depression and/or stress, maximize coping skills, provide positive support system. Participant is able to verbalize types and ability to use techniques and skills needed for reducing stress and depression.  Initial Review & Psychosocial Screening:  Initial Psych Review & Screening - 09/05/23 1326       Initial Review   Current issues with None Identified      Family Dynamics   Good Support System? Yes      Barriers  Psychosocial barriers to participate in program There are no identifiable barriers or psychosocial needs.      Screening Interventions   Interventions Encouraged to exercise;To provide support and resources with identified psychosocial needs;Provide feedback about the scores to participant    Expected Outcomes Short Term goal: Identification and review with participant of any Quality of Life or Depression concerns found by scoring the questionnaire.;Long Term goal: The participant improves quality of Life and PHQ9 Scores as seen by post scores and/or verbalization of changes;Long Term Goal: Stressors or current issues are controlled or eliminated.;Short Term goal: Utilizing psychosocial counselor, staff and physician to assist with identification of specific Stressors or current issues interfering with healing process. Setting desired goal for each stressor or current issue identified.             Quality of Life Scores:  Quality of Life - 09/14/23 1231       Quality of Life   Select Quality of Life      Quality of Life Scores   Health/Function Pre 24.32 %    Socioeconomic Pre 25.75 %    Psych/Spiritual Pre 28.43 %    Family Pre 19.5 %    GLOBAL Pre 24.9 %            Scores of 19 and below usually indicate a poorer quality of life in these areas.  A difference of  2-3 points is a clinically meaningful difference.  A difference  of 2-3 points in the total score of the Quality of Life Index has been associated with significant improvement in overall quality of life, self-image, physical symptoms, and general health in studies assessing change in quality of life.  PHQ-9: Review Flowsheet  More data exists      09/10/2023 09/26/2018 06/17/2018 04/09/2017 08/04/2015  Depression screen PHQ 2/9  Decreased Interest 0 0 0 0 0  Down, Depressed, Hopeless 0 0 0 0 0  PHQ - 2 Score 0 0 0 0 0  Altered sleeping 0 3 0 - -  Tired, decreased energy 1 0 0 - -  Change in appetite 0 0 0 - -  Feeling bad or failure about yourself  0 0 0 - -  Trouble concentrating 0 0 0 - -  Moving slowly or fidgety/restless 0 0 0 - -  Suicidal thoughts 0 0 0 - -  PHQ-9 Score 1 3 0 - -  Difficult doing work/chores Not difficult at all Not difficult at all Not difficult at all - -   Interpretation of Total Score  Total Score Depression Severity:  1-4 = Minimal depression, 5-9 = Mild depression, 10-14 = Moderate depression, 15-19 = Moderately severe depression, 20-27 = Severe depression   Psychosocial Evaluation and Intervention:  Psychosocial Evaluation - 09/05/23 1326       Psychosocial Evaluation & Interventions   Interventions Stress management education;Relaxation education;Encouraged to exercise with the program and follow exercise prescription    Comments Patient was referred to CR with stable angina. She has participated in our program pre Covid and joined our maintenance program. She was exercising at the [facility] but has had a lot of stressors in her life and she stopped exercising and then started having angina. She did have a heart cath and medical management was the treatment recommendation. She did not share what her stressors were but did say she was seeing a counselor routinely and he had her on an antidepressant but this has been discontinued and she thinks her last visit  with him will be the last one. She says the stressful situations have  improved. She denies any depression or anxiety. She says she sleeps well at night. She says she has a great support system with her boyfriend and her church family. She is very excited to be participating in the program again. Her main goals for the program are to lose weight; get back into an exercise routine and improve her angina symptoms. She has no barriers identified to participate in the program.    Expected Outcomes Short Term: Start the program and attend consistently. Long Term: Complete the program meeting her personal goals.    Continue Psychosocial Services  Follow up required by staff             Psychosocial Re-Evaluation:  Psychosocial Re-Evaluation     Row Name 10/05/23 0818             Psychosocial Re-Evaluation   Current issues with Current Stress Concerns       Comments Olegario Messier is doing well in rehab.  Her biggest stressor is her angina. She is working to push her thresholds. She has been going through several follow up MD appts and adjustments to her medications. She sleeps well at night, her dog will wake her up to play at night on occassion.       Expected Outcomes Short; Attend rehab to push anginal threshold Long; Continue to stay positive       Interventions Stress management education;Encouraged to attend Cardiac Rehabilitation for the exercise;Relaxation education                Psychosocial Discharge (Final Psychosocial Re-Evaluation):  Psychosocial Re-Evaluation - 10/05/23 0818       Psychosocial Re-Evaluation   Current issues with Current Stress Concerns    Comments Olegario Messier is doing well in rehab.  Her biggest stressor is her angina. She is working to push her thresholds. She has been going through several follow up MD appts and adjustments to her medications. She sleeps well at night, her dog will wake her up to play at night on occassion.    Expected Outcomes Short; Attend rehab to push anginal threshold Long; Continue to stay positive     Interventions Stress management education;Encouraged to attend Cardiac Rehabilitation for the exercise;Relaxation education             Vocational Rehabilitation: Provide vocational rehab assistance to qualifying candidates.   Vocational Rehab Evaluation & Intervention:  Vocational Rehab - 09/05/23 1325       Initial Vocational Rehab Evaluation & Intervention   Assessment shows need for Vocational Rehabilitation No      Vocational Rehab Re-Evaulation   Comments Patient is disabled/retired.             Education: Education Goals: Education classes will be provided on a weekly basis, covering required topics. Participant will state understanding/return demonstration of topics presented.  Learning Barriers/Preferences:  Learning Barriers/Preferences - 09/05/23 1312       Learning Barriers/Preferences   Learning Barriers None    Learning Preferences Audio;Computer/Internet;Group Instruction;Individual Instruction;Pictoral;Skilled Demonstration;Written Material             Education Topics: Hypertension, Hypertension Reduction -Define heart disease and high blood pressure. Discus how high blood pressure affects the body and ways to reduce high blood pressure. Flowsheet Row CARDIAC REHAB PHASE II EXERCISE from 10/17/2023 in [place] CARDIAC REHABILITATION  Date 10/17/23  Educator [facility]  Instruction Review Code 1- Verbalizes Understanding  Exercise and Your Heart -Discuss why it is important to exercise, the FITT principles of exercise, normal and abnormal responses to exercise, and how to exercise safely. Flowsheet Row CARDIAC REHAB PHASE II EXERCISE from 09/25/2018 in [place] CARDIAC REHABILITATION  Date 09/11/18  Educator Laural Benes  Instruction Review Code 2- Demonstrated Understanding       Angina -Discuss definition of angina, causes of angina, treatment of angina, and how to decrease risk of having angina. Flowsheet Row CARDIAC REHAB PHASE II  EXERCISE from 09/25/2018 in [place] CARDIAC REHABILITATION  Date 09/18/18  Educator Laural Benes  Instruction Review Code 2- Demonstrated Understanding       Cardiac Medications -Review what the following cardiac medications are used for, how they affect the body, and side effects that may occur when taking the medications.  Medications include Aspirin, Beta blockers, calcium channel blockers, ACE Inhibitors, angiotensin receptor blockers, diuretics, digoxin, and antihyperlipidemics. Flowsheet Row CARDIAC REHAB PHASE II EXERCISE from 09/25/2018 in [place] CARDIAC REHABILITATION  Date 06/26/18  Educator [employer] [employer]  Instruction Review Code 2- Demonstrated Understanding       Congestive Heart Failure -Discuss the definition of CHF, how to live with CHF, the signs and symptoms of CHF, and how keep track of weight and sodium intake. Flowsheet Row CARDIAC REHAB PHASE II EXERCISE from 10/17/2023 in [place] CARDIAC REHABILITATION  Date 10/17/23  Educator [facility]  Instruction Review Code 1- Verbalizes Understanding       Heart Disease and Intimacy -Discus the effect sexual activity has on the heart, how changes occur during intimacy as we age, and safety during sexual activity. Flowsheet Row CARDIAC REHAB PHASE II EXERCISE from 10/17/2023 in [place] CARDIAC REHABILITATION  Date 09/19/23  Educator jh  Instruction Review Code 1- Verbalizes Understanding       Smoking Cessation / COPD -Discuss different methods to quit smoking, the health benefits of quitting smoking, and the definition of COPD. Flowsheet Row CARDIAC REHAB PHASE II EXERCISE from 09/25/2018 in [place] CARDIAC REHABILITATION  Date 07/17/18  Educator [employer]   Instruction Review Code 2- Demonstrated Understanding       Nutrition I: Fats -Discuss the types of cholesterol, what cholesterol does to the heart, and how cholesterol levels can be controlled.   Nutrition II: Labels -Discuss the different components  of food labels and how to read food label Flowsheet Row CARDIAC REHAB PHASE II EXERCISE from 09/25/2018 in [place] [facility]AC REHABILIT[facility]  Date 08/07/18  Educator [facility]  Instruction Review Code 2- Demonstrated Understanding       Heart Parts/Heart Disease and PAD -Discuss the anatomy of the heart, the pathway of blood circulation through the heart, and these are affected by heart disease. Flowsheet Row CARDIAC REHAB PHASE II EXERCISE from 09/25/2018 in [place] CARDIAC REHABILITATION  Date 08/02/18  Educator Laural Benes  Instruction Review Code 2- Demonstrated Understanding       Stress I: Signs and Symptoms -Discuss the causes of stress, how stress may lead to anxiety and depression, and ways to limit stress. Flowsheet Row CARDIAC REHAB PHASE II EXERCISE from 10/17/2023 in [place] CARDIAC REHABILITATION  Date 10/10/23  Educator [facility]  Instruction Review Code 1- Verbalizes Understanding       Stress II: Relaxation -Discuss different types of relaxation techniques to limit stress.   Warning Signs of Stroke / TIA -Discuss definition of a stroke, what the signs and symptoms are of a stroke, and how to identify when someone is having stroke. Flowsheet Row  CARDIAC REHAB PHASE II EXERCISE from 09/25/2018 in [place] CARDIAC REHABILITATION  Date 08/28/18  Educator DJ  Instruction Review Code 2- Demonstrated Understanding       Knowledge Questionnaire Score:  Knowledge Questionnaire Score - 09/14/23 0846       Knowledge Questionnaire Score   Pre Score 23/24             Core Components/Risk Factors/Patient Goals at Admission:  Personal Goals and Risk Factors at Admission - 09/05/23 1325       Core Components/Risk Factors/Patient Goals on Admission    Weight Management Obesity    Improve shortness of breath with ADL's Yes    Intervention Provide education, individualized exercise plan and daily activity instruction to help decrease symptoms of SOB with activities of  daily living.    Expected Outcomes Short Term: Improve cardiorespiratory fitness to achieve a reduction of symptoms when performing ADLs;Long Term: Be able to perform more ADLs without symptoms or delay the onset of symptoms    Diabetes Yes    Intervention Provide education about signs/symptoms and action to take for hypo/hyperglycemia.;Provide education about proper nutrition, including hydration, and aerobic/resistive exercise prescription along with prescribed medications to achieve blood glucose in normal ranges: Fasting glucose 65-99 mg/dL    Expected Outcomes Short Term: Participant verbalizes understanding of the signs/symptoms and immediate care of hyper/hypoglycemia, proper foot care and importance of medication, aerobic/resistive exercise and nutrition plan for blood glucose control.;Long Term: Attainment of HbA1C < 7%.    Heart Failure Yes    Intervention Provide a combined exercise and nutrition program that is supplemented with education, support and counseling about heart failure. Directed toward relieving symptoms such as shortness of breath, decreased exercise tolerance, and extremity edema.    Expected Outcomes Improve functional capacity of life;Short term: Attendance in program 2-3 days a week with increased exercise capacity. Reported lower sodium intake. Reported increased fruit and vegetable intake. Reports medication compliance.;Short term: Daily weights obtained and reported for increase. Utilizing diuretic protocols set by physician.;Long term: Adoption of self-care skills and reduction of barriers for early signs and symptoms recognition and intervention leading to self-care maintenance.    Hypertension Yes    Intervention Provide education on lifestyle modifcations including regular physical activity/exercise, weight management, moderate sodium restriction and increased consumption of fresh fruit, vegetables, and low fat dairy, alcohol moderation, and smoking cessation.;Monitor  prescription use compliance.    Expected Outcomes Short Term: Continued assessment and intervention until BP is < 140/33mm HG in hypertensive participants. < 130/28mm HG in hypertensive participants with diabetes, heart failure or chronic kidney disease.;Long Term: Maintenance of blood pressure at goal levels.    Lipids Yes    Intervention Provide education and support for participant on nutrition & aerobic/resistive exercise along with prescribed medications to achieve LDL 70mg , HDL >40mg .    Expected Outcomes Short Term: Participant states understanding of desired cholesterol values and is compliant with medications prescribed. Participant is following exercise prescription and nutrition guidelines.;Long Term: Cholesterol controlled with medications as prescribed, with individualized exercise RX and with personalized nutrition plan. Value goals: LDL < 70mg , HDL > 40 mg.             Core Components/Risk Factors/Patient Goals Review:   Goals and Risk Factor Review     Row Name 10/05/23 0826             Core Components/Risk Factors/Patient Goals Review   Personal Goals Review Weight Management/Obesity;Diabetes;Heart Failure;Lipids;Hypertension       Review  Olegario Messier is doing well in rehab.  Her weight is trending down.  She eats healthy and limits her portions.  She has not had any heart failure symptoms currently.  She is working on pushing her anginal thresholds.  Her sugars are doing well and she stops her pump during class.  Her blood pressures are doing well, she called out Wednesday with a low BP.  She keeps a close eye on it and checks it frequently.       Expected Outcomes Short: Continue to work on weight loss long; Continue to keep eye on heart failure and angina                Core Components/Risk Factors/Patient Goals at Discharge (Final Review):   Goals and Risk Factor Review - 10/05/23 0826       Core Components/Risk Factors/Patient Goals Review   Personal Goals Review  Weight Management/Obesity;Diabetes;Heart Failure;Lipids;Hypertension    Review Olegario Messier is doing well in rehab.  Her weight is trending down.  She eats healthy and limits her portions.  She has not had any heart failure symptoms currently.  She is working on pushing her anginal thresholds.  Her sugars are doing well and she stops her pump during class.  Her blood pressures are doing well, she called out Wednesday with a low BP.  She keeps a close eye on it and checks it frequently.    Expected Outcomes Short: Continue to work on weight loss long; Continue to keep eye on heart failure and angina             ITP Comments:  ITP Comments     Row Name 09/05/23 1337 09/10/23 0943 09/26/23 0800 10/24/23 0801     ITP Comments Virtual visit completed 09/05/23. Her orientation visit is scheduled for Monday 2/10 at 8am. Patient arrived for 1st visit/orientation/education at 0800. Patient was referred to CR by Dr. Dina Rich due to Stable Angina. During orientation advised patient on arrival and appointment times what to wear, what to do before, during and after exercise. Reviewed attendance and class policy.  Pt is scheduled to return Cardiac Rehab on 09/12/23 at 745. Pt was advised to come to class 15 minutes before class starts.  Discussed RPE/Dpysnea scales. Patient participated in warm up stretches. Patient was able to complete 6 minute walk test.  She did report chest tightness 7/10 during walk test. It subsided to 2/10 after rest. She stated it was not bad enough to take a NTG. Telemetry:SR. Patient was measured for the equipment. Discussed equipment safety with patient. Took patient pre-anthropometric 30 day review completed. ITP sent to Dr. Dina Rich, Medical Director of Cardiac Rehab. Continue with ITP unless changes are made by physician. Still newer to program.  Working on adjusting workloads with her anginal symptoms. 30 day review completed. ITP sent to Dr. Dina Rich, Medical Director of  Cardiac Rehab. Continue with ITP unless changes are made by physician.             Comments: 30 day review

## 2023-10-26 ENCOUNTER — Encounter (HOSPITAL_COMMUNITY)
Admission: RE | Admit: 2023-10-26 | Discharge: 2023-10-26 | Disposition: A | Discharge status: Home / Self Care | Payer: Medicare Other | Source: Ambulatory Visit | Attending: Cardiology | Admitting: Cardiology

## 2023-10-26 DIAGNOSIS — I2089 Other forms of angina pectoris: Secondary | ICD-10-CM

## 2023-10-26 NOTE — Progress Notes (Signed)
 Daily Session Note  Patient Details  Name: RIN GORTON MRN: [id number] Date of Birth: 1961-06-16 Referring Provider:   Flowsheet Row CARDIAC REHAB PHASE II ORIENTATION from 09/10/2023 in [facility] CARDIAC REHABILITATION  Referring Provider Dina Rich MD       Encounter Date: 10/26/2023  Check In:  Session Check In - 10/26/23 0810       Check-In   Supervising physician immediately available to respond to emergencies See telemetry face sheet for immediately available MD    Location [facility]    Staff Present Ross Ludwig, BS, Exercise Physiologist;Dhiya Smits Laural Benes, RN, BSN    Virtual Visit No    Medication changes reported     No    Fall or balance concerns reported    No    Warm-up and Cool-down Performed on first and last piece of equipment    Resistance Training Performed Yes    VAD Patient? No    PAD/SET Patient? No      Pain Assessment   Currently in Pain? No/denies    Multiple Pain Sites No             Capillary Blood Glucose: No results found for this or any previous visit (from the past 24 hours).    Social History   Tobacco Use  Smoking Status Never  Smokeless Tobacco Never    Goals Met:  Independence with exercise equipment Exercise tolerated well No report of concerns or symptoms today Strength training completed today  Goals Unmet:  Not Applicable  Comments: Pt able to follow exercise prescription today without complaint.  Will continue to monitor for progression.

## 2023-10-29 ENCOUNTER — Encounter (HOSPITAL_COMMUNITY)
Admission: RE | Admit: 2023-10-29 | Discharge: 2023-10-29 | Disposition: A | Discharge status: Home / Self Care | Payer: Medicare Other | Source: Ambulatory Visit | Attending: Cardiology

## 2023-10-29 DIAGNOSIS — I2089 Other forms of angina pectoris: Secondary | ICD-10-CM | POA: Diagnosis not present

## 2023-10-29 DIAGNOSIS — J3081 Allergic rhinitis due to animal (cat) (dog) hair and dander: Secondary | ICD-10-CM | POA: Diagnosis not present

## 2023-10-29 NOTE — Progress Notes (Signed)
 Daily Session Note  Patient Details  Name: Jodi Ray MRN: [id number] Date of Birth: [birth year] Referring Provider:   Flowsheet Row CARDIAC REHAB PHASE II ORIENTATION from 09/10/2023 in [facility] CARDIAC REHABILITATION  Referring Provider Dina Rich MD       Encounter Date: 10/29/2023  Check In:  Session Check In - 10/29/23 0745       Check-In   Supervising physician immediately available to respond to emergencies See telemetry face sheet for immediately available MD    Location [facility]    Staff Present Ross Ludwig, BS, Exercise Physiologist;Jessica Juanetta Gosling, MA, RCEP, CCRP, CCET    Virtual Visit No    Medication changes reported     No    Fall or balance concerns reported    No    Tobacco Cessation No Change    Warm-up and Cool-down Performed on first and last piece of equipment    Resistance Training Performed Yes    VAD Patient? No    PAD/SET Patient? No      Pain Assessment   Currently in Pain? No/denies    Multiple Pain Sites No             Capillary Blood Glucose: No results found for this or any previous visit (from the past 24 hours).    Social History   Tobacco Use  Smoking Status Never  Smokeless Tobacco Never    Goals Met:  Independence with exercise equipment Exercise tolerated well No report of concerns or symptoms today Strength training completed today  Goals Unmet:  Not Applicable  Comments: Pt able to follow exercise prescription today without complaint.  Will continue to monitor for progression.

## 2023-10-29 NOTE — Progress Notes (Signed)
 VIALS MADE 10-29-23

## 2023-10-30 DIAGNOSIS — J3089 Other allergic rhinitis: Secondary | ICD-10-CM | POA: Diagnosis not present

## 2023-10-31 ENCOUNTER — Encounter: Payer: Self-pay | Admitting: Internal Medicine

## 2023-10-31 ENCOUNTER — Encounter (HOSPITAL_COMMUNITY)
Admission: RE | Admit: 2023-10-31 | Discharge: 2023-10-31 | Disposition: A | Discharge status: Home / Self Care | Payer: Medicare Other | Source: Ambulatory Visit | Attending: Cardiology | Admitting: Cardiology

## 2023-10-31 ENCOUNTER — Ambulatory Visit (INDEPENDENT_AMBULATORY_CARE_PROVIDER_SITE_OTHER): Payer: Self-pay

## 2023-10-31 DIAGNOSIS — J309 Allergic rhinitis, unspecified: Secondary | ICD-10-CM

## 2023-10-31 DIAGNOSIS — I2089 Other forms of angina pectoris: Secondary | ICD-10-CM | POA: Diagnosis not present

## 2023-10-31 NOTE — Progress Notes (Signed)
 Daily Session Note  Patient Details  Name: SYDNIE SIGMUND MRN: [id number] Date of Birth: 17-Jun-1961 Referring Provider:   Flowsheet Row CARDIAC REHAB PHASE II ORIENTATION from 09/10/2023 in [facility] CARDIAC REHABILITATION  Referring Provider Dina Rich MD       Encounter Date: 10/31/2023  Check In:  Session Check In - 10/31/23 0745       Check-In   Supervising physician immediately available to respond to emergencies PAD/SET Supervising Physician    Location [facility]    Staff Present Fabio Pierce, MA, RCEP, CCRP, CCET;Mosiah Bastin Fredric Mare, [place], Exercise Physiologist    Virtual Visit No    Medication changes reported     No    Fall or balance concerns reported    No    Tobacco Cessation No Change    Warm-up and Cool-down Performed on first and last piece of equipment    Resistance Training Performed Yes    VAD Patient? No    PAD/SET Patient? No      Pain Assessment   Currently in Pain? No/denies    Multiple Pain Sites No             Capillary Blood Glucose: No results found for this or any previous visit (from the past 24 hours).    Social History   Tobacco Use  Smoking Status Never  Smokeless Tobacco Never    Goals Met:  Independence with exercise equipment Exercise tolerated well No report of concerns or symptoms today Strength training completed today  Goals Unmet:  Not Applicable  Comments: Pt able to follow exercise prescription today without complaint.  Will continue to monitor for progression.

## 2023-11-01 NOTE — Progress Notes (Addendum)
 [facility] - Clinic Note  11/05/2023     CHIEF COMPLAINT Patient presents for Retina Follow Up   HISTORY OF PRESENT ILLNESS: Jodi Ray is a 63 y.o. female who presents to the clinic today for:   HPI     Retina Follow Up   Patient presents with  Diabetic Retinopathy.  In both eyes.  Severity is moderate.  Duration of 6 weeks.  Since onset it is stable.  I, the attending physician,  performed the HPI with the patient and updated documentation appropriately.        Comments   Patient feels the vision is doing well. Her blood sugar is 61.  She is using AT's. She feels the left eye is sore.       Last edited by Ronelle Coffee, MD on 11/10/2023  1:42 AM.    Pt states her left eye is still very sore  Referring physician: Theoplis Fix, MD [address] [place],  [place] [address]  HISTORICAL INFORMATION:   Selected notes from the MEDICAL RECORD NUMBER Referred by Dr. Micael Adas for concern of DME LEE:  Ocular Hx- PMH-    CURRENT MEDICATIONS: No current outpatient medications on file. (Ophthalmic Drugs)   No current facility-administered medications for this visit. (Ophthalmic Drugs)   Current Outpatient Medications (Other)  Medication Sig   ALPRAZolam (XANAX) 0.5 MG tablet Take 0.5 mg by mouth once a week.   aspirin EC 81 MG tablet Take 1 tablet (81 mg total) by mouth daily. Swallow whole.   atorvastatin (LIPITOR) 80 MG tablet TAKE 1 TABLET BY MOUTH ONCE DAILY AT  6PM   calcitRIOL (ROCALTROL) 0.25 MCG capsule Take 0.25 mcg by mouth every Monday, Wednesday, and Friday.   carvedilol (COREG) 12.5 MG tablet Take 1 tablet (12.5 mg total) by mouth 2 (two) times daily.   ciprofloxacin-dexamethasone (CIPRODEX) OTIC suspension Place 4 drops into the left ear 2 (two) times daily for 7 days.   Continuous Blood Gluc Sensor (DEXCOM G6 SENSOR) MISC 1 Device by Other route as directed.   EPINEPHRINE 0.3 mg/0.3 mL IJ SOAJ injection INJECT CONTENTS OF ONE PEN INTO THE  MUSCLE AS NEEDED FOR ANAPHYLAXIS   furosemide (LASIX) 40 MG tablet Take 1 tablet (40 mg total) by mouth 2 (two) times daily.   hydrALAZINE (APRESOLINE) 25 MG tablet Take 1 tablet (25 mg total) by mouth 3 (three) times daily.   Insulin Human (INSULIN PUMP) SOLN Inject into the skin continuous.    Insulin Pen Needle (PEN NEEDLES 31GX5/16") 31G X 8 MM MISC    JARDIANCE 10 MG TABS tablet Take 5 mg by mouth every Monday, Wednesday, and Friday.   KERENDIA 10 MG TABS Take 10 mg by mouth every Monday, Wednesday, and Friday.   Lactulose 20 GM/30ML SOLN Take 15 mLs (10 g total) by mouth 2 (two) times daily.   levothyroxine (SYNTHROID) 112 MCG tablet Take 112 mcg by mouth daily before breakfast.   lubiprostone (AMITIZA) 24 MCG capsule Take 1 capsule (24 mcg total) by mouth 2 (two) times daily with a meal.   metFORMIN (GLUCOPHAGE) 500 MG tablet Take 1 tablet (500 mg total) by mouth 2 (two) times daily with a meal.   nitroGLYCERIN (NITROSTAT) 0.4 MG SL tablet Place 1 tablet (0.4 mg total) under the tongue every 5 (five) minutes as needed for chest pain.   NOVOLOG 100 UNIT/ML injection Inject into the skin continuous. In insulin pump   pantoprazole (PROTONIX) 40 MG tablet TAKE 1  TABLET BY MOUTH TWICE DAILY BEFORE A MEAL   verapamil (CALAN) 80 MG tablet Take 1 tablet (80 mg total) by mouth 3 (three) times daily.   No current facility-administered medications for this visit. (Other)   REVIEW OF SYSTEMS: ROS   Positive for: Endocrine, Cardiovascular, Eyes Negative for: Constitutional, Gastrointestinal, Neurological, Skin, Genitourinary, Musculoskeletal, HENT, Respiratory, Psychiatric, Allergic/Imm, Heme/Lymph Last edited by Olene Berne, COT on 11/05/2023  8:13 AM.       ALLERGIES Allergies  Allergen Reactions   Other Nausea And Vomiting and Other (See Comments)    Opiates Alters mental status diarrhea   Fentanyl Nausea And Vomiting and Other (See Comments)    Nausea, vomiting, loss of  consciousness, requiring reversal   PAST MEDICAL HISTORY Past Medical History:  Diagnosis Date   Adult RDS (HCC)    Anemia    Brain aneurysm    frontal lobe   CAD (coronary artery disease)    a. 02/2018: s/p DES to Proximal LAD with residual 20% RCA stenosis.   Chronic pain    Diabetes mellitus without complication (HCC)    Headache    History of left bundle branch block (LBBB)    HTN (hypertension)    Hx of cardiovascular stress test 10/2016   intermediate risk study   Hypothyroidism    Neuromuscular disorder (HCC)    Neuropathy    Obesity    OSA on CPAP 03/10/2015   Renal insufficiency    Retinopathy    Sleep apnea    Varicose veins of both lower extremities    Past Surgical History:  Procedure Laterality Date   BIOPSY  11/14/2016   Procedure: BIOPSY;  Surgeon: Alyce Jubilee, MD;  [facility]: [facility];  Service: Endoscopy;;  colon gastric duodenal   BIOPSY  03/19/2023   Procedure: BIOPSY;  Surgeon: Vinetta Greening, DO;  [facility]: [facility];  Service: Endoscopy;;   BREAST EXCISIONAL BIOPSY Right    BREAST SURGERY Left 1994   Lumpectomy   COLONOSCOPY WITH PROPOFOL N/A 11/14/2016   Dr. Nolene Baumgarten: Moderately redundant rectosigmoid colon. Random colon biopsies benign. Internal hemorrhoids. Surveillance colonoscopy in 5 years.   COLONOSCOPY WITH PROPOFOL N/A 04/20/2022   Procedure: COLONOSCOPY WITH PROPOFOL;  Surgeon: Vinetta Greening, DO;  [facility]: [facility];  Service: Endoscopy;  Laterality: N/A;  12:30am   CORONARY PRESSURE/FFR STUDY N/A 06/19/2023   Procedure: CORONARY PRESSURE/FFR STUDY;  Surgeon: Kyra Phy, MD;  [facility]: [facility];  Service: Cardiovascular;  Laterality: N/A;   CORONARY STENT INTERVENTION N/A 03/22/2018   Procedure: CORONARY STENT INTERVENTION;  Surgeon: Odie Benne, MD;  Location: [facility];  Service: Cardiovascular;  Laterality: N/A;   ESOPHAGOGASTRODUODENOSCOPY (EGD) WITH PROPOFOL N/A 11/14/2016    Dr. Nolene Baumgarten: Esophagus normal. Moderate gastritis, few gastric polyps. Duodenal biopsies negative. Fundic gland polyp gastric polyp area no H pylori.   ESOPHAGOGASTRODUODENOSCOPY (EGD) WITH PROPOFOL N/A 03/19/2023   Procedure: ESOPHAGOGASTRODUODENOSCOPY (EGD) WITH PROPOFOL;  Surgeon: Vinetta Greening, DO;  [facility];  Service: Endoscopy;  Laterality: N/A;  10:15 am, asa 3   FOOT SURGERY     LEFT HEART CATH AND CORONARY ANGIOGRAPHY N/A 03/22/2018   Procedure: LEFT HEART CATH AND CORONARY ANGIOGRAPHY;  Surgeon: Odie Benne, MD;  [facility];  Service: Cardiovascular;  Laterality: N/A;   LEFT HEART CATH AND CORONARY ANGIOGRAPHY N/A 06/19/2023   Procedure: LEFT HEART CATH AND CORONARY ANGIOGRAPHY;  Surgeon: Kyra Phy, MD;  [facility];  Service: Cardiovascular;  Laterality: N/A;   LEG SURGERY Right    POLYPECTOMY  04/20/2022   Procedure: POLYPECTOMY;  Surgeon: Vinetta Greening, DO;  [facility]: [facility];  Service: Endoscopy;;   REPLACEMENT TOTAL KNEE Right    SPINAL CORD STIMULATOR IMPLANT     FAMILY HISTORY Family History  Problem Relation Age of Onset   Cancer Mother 5       colon   Cancer Father        renal cell   Colon cancer Father    Breast cancer Sister    Cancer Sister        primary brain, and primary breast too   Breast cancer Sister    Cancer Sister        breast    Breast cancer Sister    Breast cancer Sister    Cancer Brother        kidney and liver   Sleep apnea Neg Hx    SOCIAL HISTORY Social History   Tobacco Use   Smoking status: Never   Smokeless tobacco: Never  Vaping Use   Vaping status: Never Used  Substance Use Topics   Alcohol use: No    Alcohol/week: 0.0 standard drinks of alcohol   Drug use: No       OPHTHALMIC EXAM:  Base Eye Exam     Visual Acuity (Snellen - Linear)       Right Left   Dist cc 20/25 20/25 +2   Dist ph cc NI NI         Tonometry (Tonopen, 8:16 AM)        Right Left   Pressure 14 15         Pupils       Dark Shape   Right 3 Round   Left 3 Round         Visual Fields       Left Right    Full Full         Extraocular Movement       Right Left    Full, Ortho Full, Ortho         Neuro/Psych     Oriented x3: Yes   Mood/Affect: Normal         Dilation     Both eyes: 1.0% Mydriacyl, 2.5% Phenylephrine @ 8:14 AM           Slit Lamp and Fundus Exam     Slit Lamp Exam       Right Left   Lids/Lashes Dermatochalasis - upper lid, mild MGD Dermatochalasis - upper lid, mild MGD   Conjunctiva/Sclera nasal pingeucula nasal pingeucula, Trace Injection   Cornea arcus, mild tear film debris arcus, trace tear film debris   Anterior Chamber deep and clear, no cell or flare deep and clear, no cell or flare   Iris Round and dilated, No NVI Round and dilated, No NVI   Lens 2-3+ Nuclear sclerosis, 2-3+ Cortical cataract, 1+ Posterior subcapsular cataract 2-3+ Nuclear sclerosis, 2-3+ Cortical cataract, 1+ Posterior subcapsular cataract   Anterior Vitreous Vitreous syneresis Vitreous syneresis         Fundus Exam       Right Left   Disc Pink and Sharp, no NVD, temporal PPA Pink and Sharp, no NVD, temporal PPP   C/D Ratio 0.4 0.4   Macula Good foveal reflex, +cystic change -- improved, scattered MA -- improved Flat, good foveal reflex, scattered  MA -- improved, trace cystic changes   Vessels attenuated, Tortuous attenuated, Tortuous, +NV -- regressing   Periphery Attached, dense 360 PRP extending posteriorly to arcades Attached, 360 PRP with good posterior fill in, room for fill in if needed           Refraction     Wearing Rx       Sphere Cylinder Axis Add   Right +1.75 +1.50 124 +2.50   Left +4.25 +2.25 001 +2.50           IMAGING AND PROCEDURES  Imaging and Procedures for 11/05/2023  OCT, Retina - OU - Both Eyes       Right Eye Quality was good. Central Foveal Thickness: 313. Progression has  improved. Findings include normal foveal contour, no IRF, no SRF (Stable resolution of cystic changes centrally, mild interval improvement in cystic changes inferior macula).   Left Eye Quality was good. Central Foveal Thickness: 302. Progression has worsened. Findings include normal foveal contour, no SRF, epiretinal membrane, intraretinal fluid, macular pucker (Mild interval increase in cystic changes perifovea; mild ERM and pucker nasal macula).   Notes *Images captured and stored on drive  Diagnosis / Impression:  +DME OU OD: Stable resolution of cystic changes centrally, mild interval improvement in cystic changes inferior macula OS: Mild interval increase in cystic changes perifovea; mild ERM and pucker nasal macula  Clinical management:  See below  Abbreviations: NFP - Normal foveal profile. CME - cystoid macular edema. PED - pigment epithelial detachment. IRF - intraretinal fluid. SRF - subretinal fluid. EZ - ellipsoid zone. ERM - epiretinal membrane. ORA - outer retinal atrophy. ORT - outer retinal tubulation. SRHM - subretinal hyper-reflective material. IRHM - intraretinal hyper-reflective material      Intravitreal Injection, Pharmacologic Agent - OS - Left Eye       Time Out 11/05/2023. 9:26 AM. Confirmed correct patient, procedure, site, and patient consented.   Anesthesia Topical anesthesia was used. Anesthetic medications included Lidocaine 2%, Proparacaine 0.5%.   Procedure Preparation included 5% betadine to ocular surface, eyelid speculum. A (32g) needle was used.   Injection: 2 mg aflibercept 2 MG/0.05ML   Route: Intravitreal, Site: Left Eye   NDC: Q956576, Lot: [id number], Expiration date: 11/27/2024, Waste: 0 mL   Post-op Post injection exam found visual acuity of at least counting fingers. The patient tolerated the procedure well. There were no complications. The patient received written and verbal post procedure care education. Post injection  medications were not given.      Intravitreal Injection, Pharmacologic Agent - OD - Right Eye       Time Out 11/05/2023. 9:09 AM. Confirmed correct patient, procedure, site, and patient consented.   Anesthesia Topical anesthesia was used. Anesthetic medications included Lidocaine 2%, Proparacaine 0.5%.   Procedure Preparation included 5% betadine to ocular surface, eyelid speculum. A (32g) needle was used.   Injection: 2 mg aflibercept 2 MG/0.05ML   Route: Intravitreal, Site: Right Eye   NDC: Q956576, Lot: [id number], Expiration date: 12/28/2024, Waste: 0 mL   Post-op Post injection exam found visual acuity of at least counting fingers. The patient tolerated the procedure well. There were no complications. The patient received written and verbal post procedure care education.            ASSESSMENT/PLAN:    ICD-10-CM   1. Proliferative diabetic retinopathy of both eyes with macular edema associated with type 2 diabetes mellitus (HCC)  E11.3513 OCT, Retina - OU - Both Eyes    Intravitreal  Injection, Pharmacologic Agent - OS - Left Eye    Intravitreal Injection, Pharmacologic Agent - OD - Right Eye    aflibercept (EYLEA) SOLN 2 mg    aflibercept (EYLEA) SOLN 2 mg    2. Long term (current) use of oral hypoglycemic drugs  Z79.84     3. Long-term (current) use of injectable non-insulin antidiabetic drugs  Z79.85     4. Current use of insulin (HCC)  Z79.4     5. Essential hypertension  I10     6. Hypertensive retinopathy of both eyes  H35.033     7. Combined forms of age-related cataract of both eyes  H25.813      1-4. Proliferative diabetic retinopathy OU - previously managed by Dr. Ambrosio Junker -- lost to f/u since 2020 - s/p PRP OU and IVA OS x7 (BB) - s/p IVA OD #1 (01.02.24), #2 (02.02.24), #3 (03.01.24), #4(04.05.24), #5 (10.21.24) -- IVA resistance - s/p IVA OS #1 (01.04.24), #2 (02.02.24), #3 (03.01.24), #4(04.05.24), #5 (05.03.24), #6 (06.03.24), #7 (07.01.24), #8  (08.05.24), #9 (09.09.24) - IVA resistance ============================================================= - s/p IVE OD #1 (05.03.24), #2 (06.03.24), #3 (07.01.24), #4 (08.05.24), #5 (09.09.24), #6 (12.10.24), #7 (01.13.25), #8 (02.24.25) - s/p IVE OS #1 (10.21.24), #2 (12.10.24) - s/p PRP fill in OS (01.23.24) - exam shows scattered MA OU; laser PRP OU - FA (01.02.23) shows OD: Cluster of leakage temporal macula, no NV; OS: Scattered focal NVE greatest inferior and temporal quads -- will need PRP fill in OS -- completed Jan 2024 - BCVA 20/25 OU - OCT shows OD: Stable resolution of cystic changes centrally, mild interval improvement in cystic changes inferior macula; OS: Mild interval increase in cystic changes perifovea; mild ERM and pucker nasal macula at 6 weeks - recommend IVE OD #9 and IVE OS #3 today, 04.07.25 w/ f/u extended to 7-8 wks - treating OS PRN - pt wishes to proceed with IVE OU - RBA of procedure discussed, questions answered - see procedure note - IVE informed consent obtained and signed, 05.03.24 (OU) - f/u 7-8 weeks -- DFE, OCT, possible injxns  5,6. Hypertensive retinopathy OU - discussed importance of tight BP control  - monitor  7. Mixed Cataract OU - The symptoms of cataract, surgical options, and treatments and risks were discussed with patient. - discussed diagnosis and progression - monitor  Ophthalmic Meds Ordered this visit:  Meds ordered this encounter  Medications   aflibercept (EYLEA) SOLN 2 mg   aflibercept (EYLEA) SOLN 2 mg     Return for f/u 7-8 weeks, PDR OU, DFE, OCT, Possible Injxn.  There are no Patient Instructions on file for this visit.  This document serves as a record of services personally performed by Jeanice Millard, MD, PhD. It was created on their behalf by Morley Arabia. Bevin Bucks, OA an ophthalmic technician. The creation of this record is the provider's dictation and/or activities during the visit.    Electronically signed by: Morley Arabia.  Bevin Bucks, OA 11/10/23 1:44 AM   Jeanice Millard, M.D., Ph.D. Diseases & Surgery of the Retina and Vitreous [facility] 11/05/2023   I have reviewed the above documentation for accuracy and completeness, and I agree with the above. Jeanice Millard, M.D., Ph.D. 11/10/23 1:44 AM   Abbreviations: M myopia (nearsighted); A astigmatism; H hyperopia (farsighted); P presbyopia; Mrx spectacle prescription;  CTL contact lenses; OD right eye; OS left eye; OU both eyes  XT exotropia; ET esotropia; PEK punctate epithelial keratitis; PEE  punctate epithelial erosions; DES dry eye syndrome; MGD meibomian gland dysfunction; ATs artificial tears; PFAT's preservative free artificial tears; NSC nuclear sclerotic cataract; PSC posterior subcapsular cataract; ERM epi-retinal membrane; PVD posterior vitreous detachment; RD retinal detachment; DM diabetes mellitus; DR diabetic retinopathy; NPDR non-proliferative diabetic retinopathy; PDR proliferative diabetic retinopathy; CSME clinically significant macular edema; DME diabetic macular edema; dbh dot blot hemorrhages; CWS cotton wool spot; POAG primary open angle glaucoma; C/D cup-to-disc ratio; HVF humphrey visual field; GVF goldmann visual field; OCT optical coherence tomography; IOP intraocular pressure; BRVO Branch retinal vein occlusion; CRVO central retinal vein occlusion; CRAO central retinal artery occlusion; BRAO branch retinal artery occlusion; RT retinal tear; SB scleral buckle; PPV pars plana vitrectomy; VH Vitreous hemorrhage; PRP panretinal laser photocoagulation; IVK intravitreal kenalog; VMT vitreomacular traction; MH Macular hole;  NVD neovascularization of the disc; NVE neovascularization elsewhere; AREDS age related eye disease study; ARMD age related macular degeneration; POAG primary open angle glaucoma; EBMD epithelial/anterior basement membrane dystrophy; ACIOL anterior chamber intraocular lens; IOL intraocular lens; PCIOL posterior  chamber intraocular lens; Phaco/IOL phacoemulsification with intraocular lens placement; PRK photorefractive keratectomy; LASIK laser assisted in situ keratomileusis; HTN hypertension; DM diabetes mellitus; COPD chronic obstructive pulmonary disease

## 2023-11-02 ENCOUNTER — Encounter (HOSPITAL_COMMUNITY)
Admission: RE | Admit: 2023-11-02 | Discharge: 2023-11-02 | Disposition: A | Discharge status: Home / Self Care | Payer: Medicare Other | Source: Ambulatory Visit | Attending: Cardiology | Admitting: Cardiology

## 2023-11-02 DIAGNOSIS — I2089 Other forms of angina pectoris: Secondary | ICD-10-CM | POA: Diagnosis not present

## 2023-11-02 NOTE — Progress Notes (Signed)
 Daily Session Note  Patient Details  Name: Jodi Ray MRN: [id number] Date of Birth: 08-Dec-1960 Referring Provider:   Flowsheet Row CARDIAC REHAB PHASE II ORIENTATION from 09/10/2023 in [facility] CARDIAC REHABILITATION  Referring Provider Dina Rich MD       Encounter Date: 11/02/2023  Check In:  Session Check In - 11/02/23 0745       Check-In   Supervising physician immediately available to respond to emergencies See telemetry face sheet for immediately available ER MD    Location [facility]    Staff Present Rodena Medin, RN, BSN;Tina Jake Shark, RN;Brooke Elwyn Reach, RN    Virtual Visit No    Medication changes reported     No    Fall or balance concerns reported    No    Warm-up and Cool-down Performed on first and last piece of equipment    Resistance Training Performed Yes    VAD Patient? No    PAD/SET Patient? No      Pain Assessment   Currently in Pain? No/denies    Multiple Pain Sites No             Capillary Blood Glucose: No results found for this or any previous visit (from the past 24 hours).    Social History   Tobacco Use  Smoking Status Never  Smokeless Tobacco Never    Goals Met:  Independence with exercise equipment Exercise tolerated well No report of concerns or symptoms today Strength training completed today  Goals Unmet:  Not Applicable  Comments: Pt able to follow exercise prescription today without complaint.  Will continue to monitor for progression.

## 2023-11-05 ENCOUNTER — Ambulatory Visit (INDEPENDENT_AMBULATORY_CARE_PROVIDER_SITE_OTHER): Payer: Medicare Other | Admitting: Ophthalmology

## 2023-11-05 ENCOUNTER — Encounter (INDEPENDENT_AMBULATORY_CARE_PROVIDER_SITE_OTHER): Payer: Self-pay | Admitting: Ophthalmology

## 2023-11-05 ENCOUNTER — Encounter (HOSPITAL_COMMUNITY): Payer: Medicare Other

## 2023-11-05 ENCOUNTER — Encounter (INDEPENDENT_AMBULATORY_CARE_PROVIDER_SITE_OTHER): Payer: Medicare Other | Admitting: Ophthalmology

## 2023-11-05 DIAGNOSIS — H25813 Combined forms of age-related cataract, bilateral: Secondary | ICD-10-CM | POA: Diagnosis not present

## 2023-11-05 DIAGNOSIS — Z7985 Long-term (current) use of injectable non-insulin antidiabetic drugs: Secondary | ICD-10-CM

## 2023-11-05 DIAGNOSIS — Z794 Long term (current) use of insulin: Secondary | ICD-10-CM

## 2023-11-05 DIAGNOSIS — H35033 Hypertensive retinopathy, bilateral: Secondary | ICD-10-CM | POA: Diagnosis not present

## 2023-11-05 DIAGNOSIS — E113513 Type 2 diabetes mellitus with proliferative diabetic retinopathy with macular edema, bilateral: Secondary | ICD-10-CM | POA: Diagnosis not present

## 2023-11-05 DIAGNOSIS — I10 Essential (primary) hypertension: Secondary | ICD-10-CM | POA: Diagnosis not present

## 2023-11-05 DIAGNOSIS — Z7984 Long term (current) use of oral hypoglycemic drugs: Secondary | ICD-10-CM

## 2023-11-05 MED ORDER — AFLIBERCEPT 2MG/0.05ML IZ SOLN FOR KALEIDOSCOPE
2.0000 mg | INTRAVITREAL | Status: AC | PRN
  Administered 2023-11-05: 2 mg via INTRAVITREAL

## 2023-11-06 ENCOUNTER — Other Ambulatory Visit (HOSPITAL_COMMUNITY)
Admission: RE | Admit: 2023-11-06 | Discharge: 2023-11-06 | Disposition: A | Discharge status: Home / Self Care | Source: Ambulatory Visit | Attending: Nephrology | Admitting: Nephrology

## 2023-11-06 ENCOUNTER — Encounter (HOSPITAL_COMMUNITY)
Admission: RE | Admit: 2023-11-06 | Discharge: 2023-11-06 | Disposition: A | Discharge status: Home / Self Care | Source: Ambulatory Visit | Attending: Cardiology

## 2023-11-06 DIAGNOSIS — I2089 Other forms of angina pectoris: Secondary | ICD-10-CM

## 2023-11-06 DIAGNOSIS — N189 Chronic kidney disease, unspecified: Secondary | ICD-10-CM | POA: Diagnosis not present

## 2023-11-06 DIAGNOSIS — R809 Proteinuria, unspecified: Secondary | ICD-10-CM | POA: Insufficient documentation

## 2023-11-06 DIAGNOSIS — D631 Anemia in chronic kidney disease: Secondary | ICD-10-CM | POA: Diagnosis not present

## 2023-11-06 LAB — RENAL FUNCTION PANEL
Albumin: 3.3 g/dL — ABNORMAL LOW (ref 3.5–5.0)
Anion gap: 10 (ref 5–15)
BUN: 26 mg/dL — ABNORMAL HIGH (ref 8–23)
CO2: 30 mmol/L (ref 22–32)
Calcium: 9.3 mg/dL (ref 8.9–10.3)
Chloride: 101 mmol/L (ref 98–111)
Creatinine, Ser: 1.2 mg/dL — ABNORMAL HIGH (ref 0.44–1.00)
GFR, Estimated: 50 mL/min — ABNORMAL LOW (ref >60)
Glucose, Bld: 55 mg/dL — ABNORMAL LOW (ref 70–99)
Phosphorus: 2.9 mg/dL (ref 2.5–4.6)
Potassium: 4.3 mmol/L (ref 3.5–5.1)
Sodium: 141 mmol/L (ref 135–145)

## 2023-11-06 LAB — PROTEIN / CREATININE RATIO, URINE
Creatinine, Urine: 78 mg/dL
Protein Creatinine Ratio: 0.18 mg/mg{creat} — ABNORMAL HIGH (ref 0.00–0.15)
Total Protein, Urine: 14 mg/dL

## 2023-11-06 LAB — CBC
HCT: 37.2 % (ref 36.0–46.0)
Hemoglobin: 11.6 g/dL — ABNORMAL LOW (ref 12.0–15.0)
MCH: 26.1 pg (ref 26.0–34.0)
MCHC: 31.2 g/dL (ref 30.0–36.0)
MCV: 83.8 fL (ref 80.0–100.0)
Platelets: 246 10*3/uL (ref 150–400)
RBC: 4.44 MIL/uL (ref 3.87–5.11)
RDW: 18.6 % — ABNORMAL HIGH (ref 11.5–15.5)
WBC: 5.6 10*3/uL (ref 4.0–10.5)
nRBC: 0 % (ref 0.0–0.2)

## 2023-11-06 NOTE — Progress Notes (Signed)
 Daily Session Note  Patient Details  Name: Jodi Ray MRN: [id number] Date of Birth: 15-Feb-1961 Referring Provider:   Flowsheet Row CARDIAC REHAB PHASE II ORIENTATION from 09/10/2023 in [facility] CARDIAC REHABILITATION  Referring Provider Dina Rich MD       Encounter Date: 11/06/2023  Check In:  Session Check In - 11/06/23 1052       Check-In   Supervising physician immediately available to respond to emergencies See telemetry face sheet for immediately available MD    Location [facility]    Staff Present Desiree Lucy, BSN, RN, Sherlyn Hay, MA, RCEP, CCRP, CCET    Virtual Visit No    Medication changes reported     No    Fall or balance concerns reported    No    Tobacco Cessation No Change    Warm-up and Cool-down Performed on first and last piece of equipment    Resistance Training Performed Yes    VAD Patient? No    PAD/SET Patient? No      Pain Assessment   Currently in Pain? No/denies             Capillary Blood Glucose: Results for orders placed or performed during the hospital encounter of 11/06/23 (from the past 24 hours)  Protein / creatinine ratio, urine     Status: Abnormal   Collection Time: 11/06/23  8:54 AM  Result Value Ref Range   Creatinine, Urine 78 mg/dL   Total Protein, Urine 14 mg/dL   Protein Creatinine Ratio 0.18 (H) 0.00 - 0.15 mg/mg[Cre]  CBC     Status: Abnormal   Collection Time: 11/06/23  8:54 AM  Result Value Ref Range   WBC 5.6 4.0 - 10.5 K/uL   RBC 4.44 3.87 - 5.11 MIL/uL   Hemoglobin 11.6 (L) 12.0 - 15.0 g/dL   HCT 40.9 81.1 - 91.4 %   MCV 83.8 80.0 - 100.0 fL   MCH 26.1 26.0 - 34.0 pg   MCHC 31.2 30.0 - 36.0 g/dL   RDW 78.2 (H) 95.6 - 21.3 %   Platelets 246 150 - 400 K/uL   nRBC 0.0 0.0 - 0.2 %  Renal function panel     Status: Abnormal   Collection Time: 11/06/23  8:54 AM  Result Value Ref Range   Sodium 141 135 - 145 mmol/L   Potassium 4.3 3.5 - 5.1 mmol/L   Chloride 101 98  - 111 mmol/L   CO2 30 22 - 32 mmol/L   Glucose, Bld 55 (L) 70 - 99 mg/dL   BUN 26 (H) 8 - 23 mg/dL   Creatinine, Ser 0.86 (H) 0.44 - 1.00 mg/dL   Calcium 9.3 8.9 - 57.8 mg/dL   Phosphorus 2.9 2.5 - 4.6 mg/dL   Albumin 3.3 (L) 3.5 - 5.0 g/dL   GFR, Estimated 50 (L) >60 mL/min   Anion gap 10 5 - 15      Social History   Tobacco Use  Smoking Status Never  Smokeless Tobacco Never    Goals Met:  Exercise tolerated well No report of concerns or symptoms today Strength training completed today  Goals Unmet:  Not Applicable  Comments: Pt able to follow exercise prescription today without complaint.  Will continue to monitor for progression.

## 2023-11-07 ENCOUNTER — Encounter (HOSPITAL_COMMUNITY)
Admission: RE | Admit: 2023-11-07 | Discharge: 2023-11-07 | Disposition: A | Discharge status: Home / Self Care | Payer: Medicare Other | Source: Ambulatory Visit | Attending: Cardiology

## 2023-11-07 ENCOUNTER — Ambulatory Visit (INDEPENDENT_AMBULATORY_CARE_PROVIDER_SITE_OTHER): Payer: Self-pay

## 2023-11-07 ENCOUNTER — Telehealth: Payer: Self-pay | Admitting: Cardiology

## 2023-11-07 DIAGNOSIS — I2089 Other forms of angina pectoris: Secondary | ICD-10-CM | POA: Diagnosis not present

## 2023-11-07 DIAGNOSIS — J309 Allergic rhinitis, unspecified: Secondary | ICD-10-CM | POA: Diagnosis not present

## 2023-11-07 NOTE — Telephone Encounter (Signed)
 Pt walked over after cardiac rehab. She said she has gained 6lbs over night and, having SOB that started this AM. Swelling in her legs she said they are "huge". She says she was struggling at cardiac rehab amd normally she does very well. She has appt with Dr. Wyline Mood tomorrow but, unsure if she should do something today.. Please advise

## 2023-11-07 NOTE — Progress Notes (Signed)
 Daily Session Note  Patient Details  Name: Jodi Ray MRN: [id number] Date of Birth: [birth year] Referring Provider:   Flowsheet Row CARDIAC REHAB PHASE II ORIENTATION from 09/10/2023 in [facility] CARDIAC REHABILITATION  Referring Provider Dina Rich MD       Encounter Date: 11/07/2023  Check In:  Session Check In - 11/07/23 0824       Check-In   Supervising physician immediately available to respond to emergencies See telemetry face sheet for immediately available MD    Location [facility]    Staff Present Fabio Pierce, MA, RCEP, CCRP, CCET;Heather Gaynell Face, Exercise Physiologist;Hillary Leonidas Romberg BSN, RN    Virtual Visit No    Medication changes reported     No    Fall or balance concerns reported    No    Warm-up and Cool-down Performed on first and last piece of equipment    Resistance Training Performed Yes    VAD Patient? No    PAD/SET Patient? No      Pain Assessment   Currently in Pain? No/denies             Capillary Blood Glucose: Results for orders placed or performed during the hospital encounter of 11/06/23 (from the past 24 hours)  Protein / creatinine ratio, urine     Status: Abnormal   Collection Time: 11/06/23  8:54 AM  Result Value Ref Range   Creatinine, Urine 78 mg/dL   Total Protein, Urine 14 mg/dL   Protein Creatinine Ratio 0.18 (H) 0.00 - 0.15 mg/mg[Cre]  CBC     Status: Abnormal   Collection Time: 11/06/23  8:54 AM  Result Value Ref Range   WBC 5.6 4.0 - 10.5 K/uL   RBC 4.44 3.87 - 5.11 MIL/uL   Hemoglobin 11.6 (L) 12.0 - 15.0 g/dL   HCT 40.9 81.1 - 91.4 %   MCV 83.8 80.0 - 100.0 fL   MCH 26.1 26.0 - 34.0 pg   MCHC 31.2 30.0 - 36.0 g/dL   RDW 78.2 (H) 95.6 - 21.3 %   Platelets 246 150 - 400 K/uL   nRBC 0.0 0.0 - 0.2 %  Renal function panel     Status: Abnormal   Collection Time: 11/06/23  8:54 AM  Result Value Ref Range   Sodium 141 135 - 145 mmol/L   Potassium 4.3 3.5 - 5.1 mmol/L   Chloride 101  98 - 111 mmol/L   CO2 30 22 - 32 mmol/L   Glucose, Bld 55 (L) 70 - 99 mg/dL   BUN 26 (H) 8 - 23 mg/dL   Creatinine, Ser 0.86 (H) 0.44 - 1.00 mg/dL   Calcium 9.3 8.9 - 57.8 mg/dL   Phosphorus 2.9 2.5 - 4.6 mg/dL   Albumin 3.3 (L) 3.5 - 5.0 g/dL   GFR, Estimated 50 (L) >60 mL/min   Anion gap 10 5 - 15      Social History   Tobacco Use  Smoking Status Never  Smokeless Tobacco Never    Goals Met:  Proper associated with RPD/PD & O2 Sat Independence with exercise equipment Exercise tolerated well No report of concerns or symptoms today Strength training completed today  Goals Unmet:  Not Applicable  Comments: Pt able to follow exercise prescription today without complaint.  Will continue to monitor for progression.

## 2023-11-08 ENCOUNTER — Encounter: Payer: Self-pay | Admitting: Cardiology

## 2023-11-08 ENCOUNTER — Encounter (HOSPITAL_COMMUNITY)
Admission: RE | Admit: 2023-11-08 | Discharge: 2023-11-08 | Disposition: A | Discharge status: Home / Self Care | Source: Ambulatory Visit | Attending: Cardiology

## 2023-11-08 ENCOUNTER — Ambulatory Visit: Payer: Medicare Other | Attending: Cardiology | Admitting: Cardiology

## 2023-11-08 VITALS — BP 148/64 | HR 76 | Ht 68.0 in | Wt 262.0 lb

## 2023-11-08 DIAGNOSIS — I2089 Other forms of angina pectoris: Secondary | ICD-10-CM

## 2023-11-08 DIAGNOSIS — R002 Palpitations: Secondary | ICD-10-CM | POA: Diagnosis not present

## 2023-11-08 DIAGNOSIS — R0602 Shortness of breath: Secondary | ICD-10-CM | POA: Diagnosis not present

## 2023-11-08 DIAGNOSIS — I25118 Atherosclerotic heart disease of native coronary artery with other forms of angina pectoris: Secondary | ICD-10-CM

## 2023-11-08 DIAGNOSIS — I5033 Acute on chronic diastolic (congestive) heart failure: Secondary | ICD-10-CM

## 2023-11-08 MED ORDER — FUROSEMIDE 40 MG PO TABS: 40.0000 mg | ORAL_TABLET | Freq: Two times a day (BID) | ORAL | 3 refills | Status: DC

## 2023-11-08 MED ORDER — CARVEDILOL 12.5 MG PO TABS: 12.5000 mg | ORAL_TABLET | Freq: Two times a day (BID) | ORAL | 3 refills | Status: DC

## 2023-11-08 NOTE — Progress Notes (Signed)
 Daily Session Note  Patient Details  Name: Jodi Ray MRN: [id number] Date of Birth: 1960-11-03 Referring Provider:   Flowsheet Row CARDIAC REHAB PHASE II ORIENTATION from 09/10/2023 in [facility] CARDIAC REHABILITATION  Referring Provider Dina Rich MD       Encounter Date: 11/08/2023  Check In:  Session Check In - 11/08/23 1034       Check-In   Supervising physician immediately available to respond to emergencies See telemetry face sheet for immediately available MD    Location [facility]    Staff Present Rodena Medin, RN, BSN;Heather Fredric Mare, BS, Exercise Physiologist    Virtual Visit No    Medication changes reported     No    Fall or balance concerns reported    No    Warm-up and Cool-down Performed on first and last piece of equipment    Resistance Training Performed Yes    VAD Patient? No    PAD/SET Patient? No      Pain Assessment   Currently in Pain? No/denies    Multiple Pain Sites No             Capillary Blood Glucose: No results found for this or any previous visit (from the past 24 hours).    Social History   Tobacco Use  Smoking Status Never  Smokeless Tobacco Never    Goals Met:  Independence with exercise equipment Exercise tolerated well No report of concerns or symptoms today Strength training completed today  Goals Unmet:  Not Applicable  Comments: Pt able to follow exercise prescription today without complaint.  Will continue to monitor for progression.

## 2023-11-08 NOTE — Patient Instructions (Signed)
 Medication Instructions:  Your physician has recommended you make the following change in your medication:   -Increase Lasix to 60 mg twice daily for 3 days, THEN decrease back to 40 mg twice daily. -Increase Coreg to 12.5 mg twice daily   *If you need a refill on your cardiac medications before your next appointment, please call your pharmacy*  Lab Work: None If you have labs (blood work) drawn today and your tests are completely normal, you will receive your results only by: MyChart Message (if you have MyChart) OR A paper copy in the mail If you have any lab test that is abnormal or we need to change your treatment, we will call you to review the results.  Testing/Procedures: Your physician has requested that you have an echocardiogram. Echocardiography is a painless test that uses sound waves to create images of your heart. It provides your doctor with information about the size and shape of your heart and how well your heart's chambers and valves are working. This procedure takes approximately one hour. There are no restrictions for this procedure. Please do NOT wear cologne, perfume, aftershave, or lotions (deodorant is allowed). Please arrive 15 minutes prior to your appointment time.  Please note: We ask at that you not bring children with you during ultrasound (echo/ vascular) testing. Due to room size and safety concerns, children are not allowed in the ultrasound rooms during exams. Our front office staff cannot provide observation of children in our lobby area while testing is being conducted. An adult accompanying a patient to their appointment will only be allowed in the ultrasound room at the discretion of the ultrasound technician under special circumstances. We apologize for any inconvenience.   Follow-Up: At [facility], you and your health needs are our priority.  As part of our continuing mission to provide you with exceptional heart care, our providers are all  part of one team.  This team includes your primary Cardiologist (physician) and Advanced Practice Providers or APPs (Physician Assistants and Nurse Practitioners) who all work together to provide you with the care you need, when you need it.  Your next appointment:   4-6 week(s)  Provider:   You may see Dina Rich, MD or one of the following Advanced Practice Providers on your designated Care Team:   Randall An, PA-C  [place], [place] Jacolyn Reedy, [place]     We recommend signing up for the patient portal called "MyChart".  Sign up information is provided on this After Visit Summary.  MyChart is used to connect with patients for Virtual Visits (Telemedicine).  Patients are able to view lab/test results, encounter notes, upcoming appointments, etc.  Non-urgent messages can be sent to your provider as well.   To learn more about what you can do with MyChart, go to [email or web address].   Other Instructions

## 2023-11-08 NOTE — Progress Notes (Addendum)
 Clinical Summary Jodi Ray is a 63 y.o.female seen today for follow up of the following medical problems.    1. CAD - drug-eluting stent placement to the proximal LAD on 03/22/2018. - from notes has chronic nonspecific chest pains  - LV systolic function is normal with an EF of 60 to 65% by echocardiogram in December 2019  -01/2022 nuclear stress: no ischemia   - midchest to between shoulder blades, can vary in character sometimes sharp or pressure. 8/10 in severity. Can occur rest, more often with activity or exertion. Noticing some DOE that is progressing to lower levels activity. Pain can last 415 minutes. Not positional. Varies in frequency.  - feels similar to her pains before her last stent.      06/2023 cath: prox LAD 10%, patent LCX, RCA patent. LVEDP 18. Evidence of coronary microvascular dysfunction with a CFR of less than 2. The index of microvascular resistance is negative at 20.   - dizziness on ranexa 1000mg  bid and she stopped taking, tolerated 500mg  bid however and could retry if needed - coreg increased to 9.375mg  bid due to palpitations recently, she has noted bigeminy and trigeminy on tele in rehab - some chronic chest pains with activity, typically comes on with exertion. Overall stable but does come on regularly during cardiac rehab.     2. HTN Had some issues with low bp's previously however this has resolved and more recently has had high bp's - nephrorolgoist had stopped lisinopril years ago per her report due to declining renal function - started verapamil by neurologst for migraine headaches.         3. Hyperlipidemia - upcoming labs with pcp - Jan 2022 TC 167 TG 211 LDL 86 01/2021 TC 157 TG 34 HDL 88 LDL 61 -07/2023 TC 829 TG 48 HDL 88 LDL 67   4. OSA - using cpap machine, followed by Dr Albertina Hugger.    5. DM1 - followed by endocrinology   6. CKD 3 - followed by Dr Carrolyn Clan - had renal biopsy 06/28/2021. - from renal notes historically  sensitive to diuretics    7. Chronic LBBB   8. Chronic diastolic HF  Echo LVEF 60-65%, grade I dd - diuretics per renal, from there note has historically been sensitive to diuretic dosing regarding her renal function   - reported recent 6 lbs weight gain, increased DOE, leg edema.  - reports baseline is 255 lbs -     9.Dizziness - symptoms would occur with laying down, worst with rolling over. Feeling of room spinning, nausea. Would get some dizziness with standing and walking. Intermittent blurry vision - seen by ENT, thought not related.      - 02/2022 monitor rare ectopy, 6 runs SVT longest 10 beats 06/2022 CT head: no acute process. Atrophy and chronic small vessel ischemic changes  - ongoing workup by pcp, ENT.   10. Palpitations - - 02/2022 monitor rare ectopy, 6 runs SVT longest 10 beats - palpitatons daily, lasts just a few seconds. Can occur at rest or with activity.  - cardiac rehab has reported to me PVCs, bigeminy, trigeminy during sessions.  - we had recently increased coreg to 9.375mg  bid - ongoing symptoms   Past Medical History:  Diagnosis Date   Adult RDS (HCC)    Anemia    Brain aneurysm    frontal lobe   CAD (coronary artery disease)    a. 02/2018: s/p DES to Proximal LAD with residual 20% RCA  stenosis.   Chronic pain    Diabetes mellitus without complication (HCC)    Headache    History of left bundle Kaid Seeberger block (LBBB)    HTN (hypertension)    Hx of cardiovascular stress test 10/2016   intermediate risk study   Hypothyroidism    Neuromuscular disorder (HCC)    Neuropathy    Obesity    OSA on CPAP 03/10/2015   Renal insufficiency    Retinopathy    Sleep apnea    Varicose veins of both lower extremities      Allergies  Allergen Reactions   Other Nausea And Vomiting and Other (See Comments)    Opiates Alters mental status diarrhea   Fentanyl Nausea And Vomiting and Other (See Comments)    Nausea, vomiting, loss of consciousness,  requiring reversal     Current Outpatient Medications  Medication Sig Dispense Refill   ALPRAZolam (XANAX) 0.5 MG tablet Take 0.5 mg by mouth once a week.     aspirin EC 81 MG tablet Take 1 tablet (81 mg total) by mouth daily. Swallow whole. 90 tablet 3   atorvastatin (LIPITOR) 80 MG tablet TAKE 1 TABLET BY MOUTH ONCE DAILY AT  6PM 90 tablet 3   calcitRIOL (ROCALTROL) 0.25 MCG capsule Take 0.25 mcg by mouth every Monday, Wednesday, and Friday.     carvedilol (COREG) 6.25 MG tablet Take 1.5 tablets (9.375 mg total) by mouth 2 (two) times daily. 270 tablet 3   Continuous Blood Gluc Sensor (DEXCOM G6 SENSOR) MISC 1 Device by Other route as directed.     EPINEPHRINE 0.3 mg/0.3 mL IJ SOAJ injection INJECT CONTENTS OF ONE PEN INTO THE MUSCLE AS NEEDED FOR ANAPHYLAXIS 2 each 0   furosemide (LASIX) 20 MG tablet Take 20-40 mg by mouth See admin instructions. Take 2 tablets in the morning and 2 tablet in the evening     hydrALAZINE (APRESOLINE) 25 MG tablet Take 1 tablet (25 mg total) by mouth 3 (three) times daily. 270 tablet 3   Insulin Human (INSULIN PUMP) SOLN Inject into the skin continuous.      Insulin Pen Needle (PEN NEEDLES 31GX5/16") 31G X 8 MM MISC      JARDIANCE 10 MG TABS tablet Take 5 mg by mouth every Monday, Wednesday, and Friday.     KERENDIA 10 MG TABS Take 10 mg by mouth every Monday, Wednesday, and Friday.     Lactulose 20 GM/30ML SOLN Take 15 mLs (10 g total) by mouth 2 (two) times daily. 900 mL 3   levothyroxine (SYNTHROID) 112 MCG tablet Take 112 mcg by mouth daily before breakfast.     lubiprostone (AMITIZA) 24 MCG capsule Take 1 capsule (24 mcg total) by mouth 2 (two) times daily with a meal. 60 capsule 11   metFORMIN (GLUCOPHAGE) 500 MG tablet Take 1 tablet (500 mg total) by mouth 2 (two) times daily with a meal.     nitroGLYCERIN (NITROSTAT) 0.4 MG SL tablet Place 1 tablet (0.4 mg total) under the tongue every 5 (five) minutes as needed for chest pain. 25 tablet 3   NOVOLOG  100 UNIT/ML injection Inject into the skin continuous. In insulin pump  0   pantoprazole (PROTONIX) 40 MG tablet TAKE 1 TABLET BY MOUTH TWICE DAILY BEFORE A MEAL 60 tablet 0   ranolazine (RANEXA) 1000 MG SR tablet Take 1 tablet (1,000 mg total) by mouth 2 (two) times daily. (Patient not taking: Reported on 10/12/2023) 180 tablet 3   verapamil (CALAN) 80  MG tablet Take 1 tablet (80 mg total) by mouth 3 (three) times daily. 90 tablet 5   No current facility-administered medications for this visit.     Past Surgical History:  Procedure Laterality Date   BIOPSY  11/14/2016   Procedure: BIOPSY;  Surgeon: Alyce Jubilee, MD;  Location: [facility];  Service: Endoscopy;;  colon gastric duodenal   BIOPSY  03/19/2023   Procedure: BIOPSY;  Surgeon: Vinetta Greening, DO;  Location: [facility];  Service: Endoscopy;;   BREAST EXCISIONAL BIOPSY Right    BREAST SURGERY Left 1994   Lumpectomy   COLONOSCOPY WITH PROPOFOL N/A 11/14/2016   Dr. Nolene Baumgarten: Moderately redundant rectosigmoid colon. Random colon biopsies benign. Internal hemorrhoids. Surveillance colonoscopy in 5 years.   COLONOSCOPY WITH PROPOFOL N/A 04/20/2022   Procedure: COLONOSCOPY WITH PROPOFOL;  Surgeon: Vinetta Greening, DO;  Location: [facility];  Service: Endoscopy;  Laterality: N/A;  12:30am   CORONARY PRESSURE/FFR STUDY N/A 06/19/2023   Procedure: CORONARY PRESSURE/FFR STUDY;  Surgeon: Kyra Phy, MD;  Location: [facility];  Service: Cardiovascular;  Laterality: N/A;   CORONARY STENT INTERVENTION N/A 03/22/2018   Procedure: CORONARY STENT INTERVENTION;  Surgeon: Odie Benne, MD;  Location: [facility];  Service: Cardiovascular;  Laterality: N/A;   ESOPHAGOGASTRODUODENOSCOPY (EGD) WITH PROPOFOL N/A 11/14/2016   Dr. Nolene Baumgarten: Esophagus normal. Moderate gastritis, few gastric polyps. Duodenal biopsies negative. Fundic gland polyp gastric polyp area no H pylori.   ESOPHAGOGASTRODUODENOSCOPY (EGD) WITH  PROPOFOL N/A 03/19/2023   Procedure: ESOPHAGOGASTRODUODENOSCOPY (EGD) WITH PROPOFOL;  Surgeon: Vinetta Greening, DO;  Location[facility];  Service: Endoscopy;  Laterality: N/A;  10:15 am, asa 3   FOOT SURGERY     LEFT HEART CATH AND CORONARY ANGIOGRAPHY N/A 03/22/2018   Procedure: LEFT HEART CATH AND CORONARY ANGIOGRAPHY;  Surgeon: Odie Benne, MD;  Location: [facility];  Service: Cardiovascular;  Laterality: N/A;   LEFT HEART CATH AND CORONARY ANGIOGRAPHY N/A 06/19/2023   Procedure: LEFT HEART CATH AND CORONARY ANGIOGRAPHY;  Surgeon: Kyra Phy, MD;  Location: [facility];  Service: Cardiovascular;  Laterality: N/A;   LEG SURGERY Right    POLYPECTOMY  04/20/2022   Procedure: POLYPECTOMY;  Surgeon: Vinetta Greening, DO;  Location: [facility];  Service: Endoscopy;;   REPLACEMENT TOTAL KNEE Right    SPINAL CORD STIMULATOR IMPLANT       Allergies  Allergen Reactions   Other Nausea And Vomiting and Other (See Comments)    Opiates Alters mental status diarrhea   Fentanyl Nausea And Vomiting and Other (See Comments)    Nausea, vomiting, loss of consciousness, requiring reversal      Family History  Problem Relation Age of Onset   Cancer Mother 33       colon   Cancer Father        renal cell   Colon cancer Father    Breast cancer Sister    Cancer Sister        primary brain, and primary breast too   Breast cancer Sister    Cancer Sister        breast    Breast cancer Sister    Breast cancer Sister    Cancer Brother        kidney and liver   Sleep apnea Neg Hx      Social History Ms. Masri reports that she has never smoked. She has never used smokeless  tobacco. Ms. Hamidi reports no history of alcohol use.   Review of Systems CONSTITUTIONAL: No weight loss, fever, chills, weakness or fatigue.  HEENT: Eyes: No visual loss, blurred vision, double vision or yellow sclerae.No hearing loss, sneezing, congestion, runny nose or sore  throat.  SKIN: No rash or itching.  CARDIOVASCULAR:  RESPIRATORY: No shortness of breath, cough or sputum.  GASTROINTESTINAL: No anorexia, nausea, vomiting or diarrhea. No abdominal pain or blood.  GENITOURINARY: No burning on urination, no polyuria NEUROLOGICAL: No headache, dizziness, syncope, paralysis, ataxia, numbness or tingling in the extremities. No change in bowel or bladder control.  MUSCULOSKELETAL: No muscle, back pain, joint pain or stiffness.  LYMPHATICS: No enlarged nodes. No history of splenectomy.  PSYCHIATRIC: No history of depression or anxiety.  ENDOCRINOLOGIC: No reports of sweating, cold or heat intolerance. No polyuria or polydipsia.  .  Gen: resting comfortably, no acute distress HEENT: no scleral icterus, pupils equal round and reactive, no palptable cervical adenopathy,  CV: RRR, no mrg, no jvd Resp: Clear to auscultation bilaterally GI: abdomen is soft, non-tender, non-distended, normal bowel sounds, no hepatosplenomegaly MSK: extremities are warm, 1+ bilateral LE edema Skin: warm, no rash Neuro:  no focal deficits Psych: appropriate affect   Diagnostic Studies  Echocardiogram on 07/17/2018 demonstrated normal left ventricular systolic function and regional wall motion, LVEF 60 to 65%, indeterminate grade diastolic dysfunction, moderate LVH, and mild left atrial dilatation.     Cardiac catheterization 03/22/18:   Prox RCA lesion is 20% stenosed. Prox LAD lesion is 80% stenosed. A drug-eluting stent was successfully placed using a STENT SIERRA 3.00 X 18 MM. Post intervention, there is a 0% residual stenosis. The left ventricular systolic function is normal. LV end diastolic pressure is normal. The left ventricular ejection fraction is 55-65% by visual estimate. There is no mitral valve regurgitation.   1. Severe stenosis proximal LAD 2. Successful PTCA/DES x 1 proximal LAD 3. Mild non-obstructive disease in the RCA\ 4. Normal LV systolic function    10/2019 echo 1. Left ventricular ejection fraction, by estimation, is 60 to 65%. The  left ventricle has normal function. The left ventricle has no regional  wall motion abnormalities. Left ventricular diastolic parameters are  consistent with Grade I diastolic  dysfunction (impaired relaxation).   2. Right ventricular systolic function is normal. The right ventricular  size is normal. Tricuspid regurgitation signal is inadequate for assessing  PA pressure.   3. The mitral valve is degenerative. No evidence of mitral valve  regurgitation. No evidence of mitral stenosis.   4. The aortic valve is tricuspid. Aortic valve regurgitation is not  visualized. No aortic stenosis is present.   5. The inferior vena cava is dilated in size with >50% respiratory  variability, suggesting right atrial pressure of 8 mmHg.    10/2020 echo IMPRESSIONS     1. Left ventricular ejection fraction, by estimation, is 60 to 65%. The  left ventricle has normal function. The left ventricle has no regional  wall motion abnormalities. Left ventricular diastolic parameters are  consistent with Grade I diastolic  dysfunction (impaired relaxation).   2. Right ventricular systolic function is normal. The right ventricular  size is normal.   3. Left atrial size was severely dilated.   4. Right atrial size was mildly dilated.   5. The mitral valve is normal in structure. No evidence of mitral valve  regurgitation. No evidence of mitral stenosis.   6. The aortic valve is tricuspid. There is mild calcification of  the  aortic valve. There is mild thickening of the aortic valve. Aortic valve  regurgitation is not visualized. No aortic stenosis is present.   7. The inferior vena cava is normal in size with greater than 50%  respiratory variability, suggesting right atrial pressure of 3 mmHg.      01/2022 nuclear stress The study is normal. The study is low risk.   No ST deviation was noted.   LV perfusion is normal.  There is no evidence of ischemia. There is no evidence of infarction.   Left ventricular function is normal.EF 75%  End diastolic cavity size is normal. End systolic cavity size is normal.     02/2022 14 day monitor 14 day monitor   Rare supraventricular ectopy in the form of isolated PACs, couplets, triplets. 6 runs of SVT longest 10 beats.   Rare ventricular ectopy in the form of isolated PVCs, couplets   Reported symptoms correlated with sinus rhythm, rare PACs and PVCs   06/2023 cath: prox LAD 10%, patent LCX, RCA patent.     Assessment and Plan   1.  Coronary artery disease with chronic stable angina - recent cath with patent vessels, did have evidence of microvascular disease - some ongoign stable anginal symptoms - we will increase coreg to 12.5mg  bid. Did not tolerate ranexa 1000mg  bid but did tolerate 500mg  bid, could retry if needed once beta blocker maximized.    2.HTN -elevated today, increase coreg to 12.5mg  bid  3. HLD - at goal, continue current meds  4. Acute on chronic HFpEF - increased weight by 6 lbs, increase LE edema and DOE - increase lasix to 60mg  bid x 3 days, then back to 40mg  bid. She will update us  on weights and swelling on Monday, baseline weights are around 255 lbs - repeat echo  5. Palpitations - PVCs noted during cardiac rehab, likely etiology - ongoing symptoms, increase coreg to 12.5mg  bid   F/u 4-6 weeks.      Laurann Pollock, M.D.

## 2023-11-09 ENCOUNTER — Encounter (HOSPITAL_COMMUNITY): Payer: Medicare Other

## 2023-11-09 ENCOUNTER — Ambulatory Visit (INDEPENDENT_AMBULATORY_CARE_PROVIDER_SITE_OTHER): Payer: Medicare Other | Admitting: Otolaryngology

## 2023-11-09 ENCOUNTER — Encounter (INDEPENDENT_AMBULATORY_CARE_PROVIDER_SITE_OTHER): Payer: Self-pay

## 2023-11-09 VITALS — BP 121/69 | HR 71 | Ht 68.0 in | Wt 241.0 lb

## 2023-11-09 DIAGNOSIS — H608X3 Other otitis externa, bilateral: Secondary | ICD-10-CM

## 2023-11-09 DIAGNOSIS — H6123 Impacted cerumen, bilateral: Secondary | ICD-10-CM | POA: Diagnosis not present

## 2023-11-09 MED ORDER — CIPROFLOXACIN-DEXAMETHASONE 0.3-0.1 % OT SUSP
4.0000 [drp] | Freq: Two times a day (BID) | OTIC | 8 refills | Status: AC
Start: 2023-11-09 — End: 2023-11-16

## 2023-11-09 NOTE — Progress Notes (Signed)
 Patient ID: Jodi Ray, female   DOB: [birth year], 63 y.o.   MRN: [id number]  Follow up: Left ear drainage, clogging and itchy sensation in ears   HPI: The patient is a 63 year old female who returns today complaining of intermittent left ear drainage.  She also complains of clogging and itchy sensation in her ears.  The patient was previously seen for chronic eczematous otitis externa, recurrent dizziness, hearing loss, and recurrent cerumen impaction.  According to the patient, she continues to have frequent itchy sensation in her ears.  She has also noted intermittent left ear drainage lately.  She denies any significant otalgia or vertigo.  She denies any recent change in her hearing.   Exam: General: Communicates without difficulty, well nourished, no acute distress. Head: Normocephalic, no evidence injury, no tenderness, facial buttresses intact without stepoff. Face/sinus: No tenderness to palpation and percussion. Facial movement is normal and symmetric. Eyes: PERRL, EOMI. No scleral icterus, conjunctivae clear. Neuro: CN II exam reveals vision grossly intact.  No nystagmus at any point of gaze. Ears: Auricles well formed without lesions.  Bilateral cerumen impaction.  Eczematous changes are noted within the ear canals.  Nose: External evaluation reveals normal support and skin without lesions.  Dorsum is intact.  Anterior rhinoscopy reveals congested mucosa over anterior aspect of inferior turbinates and intact septum.  No purulence noted. Oral:  Oral cavity and oropharynx are intact, symmetric, without erythema or edema.  Mucosa is moist without lesions. Neck: Full range of motion without pain.  There is no significant lymphadenopathy.  No masses palpable.  Thyroid bed within normal limits to palpation.  Parotid glands and submandibular glands equal bilaterally without mass.  Trachea is midline. Neuro:  CN 2-12 grossly intact.    Procedure: Bilateral cerumen disimpaction Anesthesia:  None Description: Under the operating microscope, the cerumen is carefully removed with a combination of cerumen currette, alligator forceps, and suction catheters.  After the cerumen is removed, the TMs are noted to be normal.  Eczematous changes are noted in both ear canals.  No mass, erythema, or lesions. The patient tolerated the procedure well.    Assessment: 1.  Bilateral cerumen impaction. 2.  After the cerumen removal procedure, both tympanic membranes and middle ear spaces are noted to be normal.  Eczematous changes are noted within the ear canals. 3.  Bilateral chronic eczematous otitis externa.   Plan: 1.  Otomicroscopy with bilateral cerumen disimpaction. 2.  The physical exam findings are reviewed with the patient. 3.  Elocon cream and Ciprodex eardrops to treat the chronic eczematous otitis externa. 4.  The patient will return for reevaluation in 4 months.

## 2023-11-12 ENCOUNTER — Telehealth: Payer: Self-pay | Admitting: Cardiology

## 2023-11-12 ENCOUNTER — Encounter (HOSPITAL_COMMUNITY)

## 2023-11-12 ENCOUNTER — Encounter (HOSPITAL_COMMUNITY): Payer: Medicare Other

## 2023-11-12 DIAGNOSIS — E039 Hypothyroidism, unspecified: Secondary | ICD-10-CM | POA: Diagnosis not present

## 2023-11-12 DIAGNOSIS — E11319 Type 2 diabetes mellitus with unspecified diabetic retinopathy without macular edema: Secondary | ICD-10-CM | POA: Diagnosis not present

## 2023-11-12 DIAGNOSIS — E669 Obesity, unspecified: Secondary | ICD-10-CM | POA: Diagnosis not present

## 2023-11-12 DIAGNOSIS — E109 Type 1 diabetes mellitus without complications: Secondary | ICD-10-CM | POA: Diagnosis not present

## 2023-11-12 DIAGNOSIS — M1460 Charcot's joint, unspecified site: Secondary | ICD-10-CM | POA: Diagnosis not present

## 2023-11-12 DIAGNOSIS — I10 Essential (primary) hypertension: Secondary | ICD-10-CM | POA: Diagnosis not present

## 2023-11-12 DIAGNOSIS — E118 Type 2 diabetes mellitus with unspecified complications: Secondary | ICD-10-CM | POA: Diagnosis not present

## 2023-11-12 DIAGNOSIS — N1832 Chronic kidney disease, stage 3b: Secondary | ICD-10-CM | POA: Diagnosis not present

## 2023-11-12 DIAGNOSIS — I2510 Atherosclerotic heart disease of native coronary artery without angina pectoris: Secondary | ICD-10-CM | POA: Diagnosis not present

## 2023-11-12 DIAGNOSIS — E785 Hyperlipidemia, unspecified: Secondary | ICD-10-CM | POA: Diagnosis not present

## 2023-11-12 DIAGNOSIS — Z794 Long term (current) use of insulin: Secondary | ICD-10-CM | POA: Diagnosis not present

## 2023-11-12 DIAGNOSIS — Z9641 Presence of insulin pump (external) (internal): Secondary | ICD-10-CM | POA: Diagnosis not present

## 2023-11-12 NOTE — Telephone Encounter (Signed)
 Patient called to say that she has gain 1lbs sinceThursdays and patient is still swollen. Please advise

## 2023-11-12 NOTE — Telephone Encounter (Signed)
 Patient notified and verbalized understanding of plan. Pt will increase medication for 3 days, then go back to original dosing. Patient will call office Friday with updated weights and swelling.

## 2023-11-12 NOTE — Telephone Encounter (Signed)
 Increase lasix to 80mg  in AM and 60mg  in PM x 3 days, then back to 40mg  bid. Update us  on weights and swelling on Friday  Letta Raw MD

## 2023-11-14 ENCOUNTER — Ambulatory Visit (INDEPENDENT_AMBULATORY_CARE_PROVIDER_SITE_OTHER): Payer: Self-pay

## 2023-11-14 ENCOUNTER — Encounter (HOSPITAL_COMMUNITY)
Admission: RE | Admit: 2023-11-14 | Discharge: 2023-11-14 | Disposition: A | Discharge status: Home / Self Care | Payer: Medicare Other | Source: Ambulatory Visit | Attending: Cardiology | Admitting: Cardiology

## 2023-11-14 DIAGNOSIS — E1122 Type 2 diabetes mellitus with diabetic chronic kidney disease: Secondary | ICD-10-CM | POA: Diagnosis not present

## 2023-11-14 DIAGNOSIS — J309 Allergic rhinitis, unspecified: Secondary | ICD-10-CM | POA: Diagnosis not present

## 2023-11-14 DIAGNOSIS — N2581 Secondary hyperparathyroidism of renal origin: Secondary | ICD-10-CM | POA: Diagnosis not present

## 2023-11-14 DIAGNOSIS — N1831 Chronic kidney disease, stage 3a: Secondary | ICD-10-CM | POA: Diagnosis not present

## 2023-11-14 DIAGNOSIS — I2089 Other forms of angina pectoris: Secondary | ICD-10-CM | POA: Diagnosis not present

## 2023-11-14 DIAGNOSIS — I129 Hypertensive chronic kidney disease with stage 1 through stage 4 chronic kidney disease, or unspecified chronic kidney disease: Secondary | ICD-10-CM | POA: Diagnosis not present

## 2023-11-14 NOTE — Progress Notes (Addendum)
 Daily Session Note  Patient Details  Name: Jodi Ray MRN: [id number] Date of Birth: 23-Jul-1961 Referring Provider:   Flowsheet Row CARDIAC REHAB PHASE II ORIENTATION from 09/10/2023 in [facility] CARDIAC REHABILIT[facility]  Referring Provider Armida Lander MD       Encounter Date: 11/14/2023  Check In:  Session Check In - 11/14/23 0745       Check-In   Supervising physician immediately available to respond to emergencies See telemetry face sheet for immediately available MD    Location [facility]    Staff Present Doug Gehrig, RN, BSN;Heather Toy Freund, BS, Exercise Physiologist    Virtual Visit No    Medication changes reported     Yes    Comments Dr. Amanda Jungling increased Coreg 1000 mg BID/Increased Lasix to 60 mg BIDx3 days then back to 40 mg BID.    Fall or balance concerns reported    No    Warm-up and Cool-down Performed on first and last piece of equipment    Resistance Training Performed Yes    VAD Patient? No    PAD/SET Patient? No      Pain Assessment   Currently in Pain? No/denies    Multiple Pain Sites No             Capillary Blood Glucose: No results found for this or any previous visit (from the past 24 hours).    Social History   Tobacco Use  Smoking Status Never  Smokeless Tobacco Never    Goals Met:  Independence with exercise equipment Exercise tolerated well No report of concerns or symptoms today Strength training completed today  Goals Unmet:  Not Applicable  Comments: Pt able to follow exercise prescription today without complaint.  Will continue to monitor for progression.

## 2023-11-16 ENCOUNTER — Encounter (HOSPITAL_COMMUNITY)
Admission: RE | Admit: 2023-11-16 | Discharge: 2023-11-16 | Disposition: A | Discharge status: Home / Self Care | Payer: Medicare Other | Source: Ambulatory Visit | Attending: Cardiology | Admitting: Cardiology

## 2023-11-16 DIAGNOSIS — I2089 Other forms of angina pectoris: Secondary | ICD-10-CM

## 2023-11-16 NOTE — Progress Notes (Signed)
 Daily Session Note  Patient Details  Name: Jodi Ray MRN: [id number] Date of Birth: 1960/10/04 Referring Provider:   Flowsheet Row CARDIAC REHAB PHASE II ORIENTATION from 09/10/2023 in [facility] CARDIAC REHABILITATION  Referring Provider Armida Lander MD       Encounter Date: 11/16/2023  Check In:  Session Check In - 11/16/23 0752       Check-In   Supervising physician immediately available to respond to emergencies See telemetry face sheet for immediately available MD    Location [facility]    Staff Present Jerrol Morelle, BSN, RN, WTA-C;Heather Toy Freund, BS, Exercise Physiologist    Virtual Visit No    Medication changes reported     No    Fall or balance concerns reported    No    Tobacco Cessation No Change    Warm-up and Cool-down Performed on first and last piece of equipment    Resistance Training Performed Yes    VAD Patient? No    PAD/SET Patient? No      Pain Assessment   Currently in Pain? No/denies             Capillary Blood Glucose: No results found for this or any previous visit (from the past 24 hours).    Social History   Tobacco Use  Smoking Status Never  Smokeless Tobacco Never    Goals Met:  Independence with exercise equipment Exercise tolerated well No report of concerns or symptoms today Strength training completed today  Goals Unmet:  Not Applicable  Comments: Pt able to follow exercise prescription today without complaint.  Will continue to monitor for progression.

## 2023-11-19 ENCOUNTER — Encounter (HOSPITAL_COMMUNITY): Payer: Medicare Other

## 2023-11-20 DIAGNOSIS — R231 Pallor: Secondary | ICD-10-CM | POA: Diagnosis not present

## 2023-11-20 DIAGNOSIS — R4701 Aphasia: Secondary | ICD-10-CM | POA: Diagnosis not present

## 2023-11-20 DIAGNOSIS — I447 Left bundle-branch block, unspecified: Secondary | ICD-10-CM | POA: Diagnosis not present

## 2023-11-20 DIAGNOSIS — R4182 Altered mental status, unspecified: Secondary | ICD-10-CM | POA: Diagnosis not present

## 2023-11-20 DIAGNOSIS — Z7982 Long term (current) use of aspirin: Secondary | ICD-10-CM | POA: Diagnosis not present

## 2023-11-20 DIAGNOSIS — I639 Cerebral infarction, unspecified: Secondary | ICD-10-CM | POA: Diagnosis not present

## 2023-11-20 DIAGNOSIS — Z7902 Long term (current) use of antithrombotics/antiplatelets: Secondary | ICD-10-CM | POA: Diagnosis not present

## 2023-11-20 DIAGNOSIS — N183 Chronic kidney disease, stage 3 unspecified: Secondary | ICD-10-CM | POA: Diagnosis not present

## 2023-11-20 DIAGNOSIS — E11649 Type 2 diabetes mellitus with hypoglycemia without coma: Secondary | ICD-10-CM | POA: Diagnosis not present

## 2023-11-20 DIAGNOSIS — I454 Nonspecific intraventricular block: Secondary | ICD-10-CM | POA: Diagnosis not present

## 2023-11-20 DIAGNOSIS — Z7984 Long term (current) use of oral hypoglycemic drugs: Secondary | ICD-10-CM | POA: Diagnosis not present

## 2023-11-20 DIAGNOSIS — I2510 Atherosclerotic heart disease of native coronary artery without angina pectoris: Secondary | ICD-10-CM | POA: Diagnosis not present

## 2023-11-20 DIAGNOSIS — E1122 Type 2 diabetes mellitus with diabetic chronic kidney disease: Secondary | ICD-10-CM | POA: Diagnosis not present

## 2023-11-20 DIAGNOSIS — Z66 Do not resuscitate: Secondary | ICD-10-CM | POA: Diagnosis not present

## 2023-11-20 DIAGNOSIS — Z9641 Presence of insulin pump (external) (internal): Secondary | ICD-10-CM | POA: Diagnosis not present

## 2023-11-20 DIAGNOSIS — E039 Hypothyroidism, unspecified: Secondary | ICD-10-CM | POA: Diagnosis not present

## 2023-11-20 DIAGNOSIS — I10 Essential (primary) hypertension: Secondary | ICD-10-CM | POA: Diagnosis not present

## 2023-11-20 DIAGNOSIS — E1369 Other specified diabetes mellitus with other specified complication: Secondary | ICD-10-CM | POA: Diagnosis not present

## 2023-11-20 DIAGNOSIS — N179 Acute kidney failure, unspecified: Secondary | ICD-10-CM | POA: Diagnosis not present

## 2023-11-20 DIAGNOSIS — R4781 Slurred speech: Secondary | ICD-10-CM | POA: Diagnosis not present

## 2023-11-20 DIAGNOSIS — I130 Hypertensive heart and chronic kidney disease with heart failure and stage 1 through stage 4 chronic kidney disease, or unspecified chronic kidney disease: Secondary | ICD-10-CM | POA: Diagnosis not present

## 2023-11-20 DIAGNOSIS — F419 Anxiety disorder, unspecified: Secondary | ICD-10-CM | POA: Diagnosis not present

## 2023-11-20 DIAGNOSIS — I5032 Chronic diastolic (congestive) heart failure: Secondary | ICD-10-CM | POA: Diagnosis not present

## 2023-11-20 DIAGNOSIS — Z79890 Hormone replacement therapy (postmenopausal): Secondary | ICD-10-CM | POA: Diagnosis not present

## 2023-11-20 DIAGNOSIS — R29810 Facial weakness: Secondary | ICD-10-CM | POA: Diagnosis not present

## 2023-11-21 ENCOUNTER — Encounter (HOSPITAL_COMMUNITY): Payer: Self-pay | Admitting: *Deleted

## 2023-11-21 ENCOUNTER — Encounter (HOSPITAL_COMMUNITY): Payer: Medicare Other

## 2023-11-21 ENCOUNTER — Ambulatory Visit (INDEPENDENT_AMBULATORY_CARE_PROVIDER_SITE_OTHER): Payer: Self-pay

## 2023-11-21 DIAGNOSIS — J309 Allergic rhinitis, unspecified: Secondary | ICD-10-CM

## 2023-11-21 DIAGNOSIS — I2089 Other forms of angina pectoris: Secondary | ICD-10-CM

## 2023-11-21 NOTE — Progress Notes (Signed)
 Cardiac Individual Treatment Plan  Patient Details  Name: Jodi Ray MRN: [id number] Date of Birth: Jan 25, 1961 Referring Provider:   Flowsheet Row CARDIAC REHAB PHASE II ORIENTATION from 09/10/2023 in [facility] CARDIAC REHABILITATION  Referring Provider Armida Lander MD       Initial Encounter Date:  Flowsheet Row CARDIAC REHAB PHASE II ORIENTATION from 09/10/2023 in [place] CARDIAC REHABILITATION  Date 09/10/23       Visit Diagnosis: Stable angina (HCC)  Patient's Home Medications on Admission:  Current Outpatient Medications:    ALPRAZolam (XANAX) 0.5 MG tablet, Take 0.5 mg by mouth once a week., Disp: , Rfl:    aspirin  EC 81 MG tablet, Take 1 tablet (81 mg total) by mouth daily. Swallow whole., Disp: 90 tablet, Rfl: 3   atorvastatin  (LIPITOR) 80 MG tablet, TAKE 1 TABLET BY MOUTH ONCE DAILY AT  6PM, Disp: 90 tablet, Rfl: 3   calcitRIOL (ROCALTROL) 0.25 MCG capsule, Take 0.25 mcg by mouth every Monday, Wednesday, and Friday., Disp: , Rfl:    carvedilol  (COREG ) 12.5 MG tablet, Take 1 tablet (12.5 mg total) by mouth 2 (two) times daily., Disp: 180 tablet, Rfl: 3   Continuous Blood Gluc Sensor (DEXCOM G6 SENSOR) MISC, 1 Device by Other route as directed., Disp: , Rfl:    EPINEPHRINE  0.3 mg/0.3 mL IJ SOAJ injection, INJECT CONTENTS OF ONE PEN INTO THE MUSCLE AS NEEDED FOR ANAPHYLAXIS, Disp: 2 each, Rfl: 0   furosemide  (LASIX ) 40 MG tablet, Take 1 tablet (40 mg total) by mouth 2 (two) times daily., Disp: 180 tablet, Rfl: 3   hydrALAZINE  (APRESOLINE ) 25 MG tablet, Take 1 tablet (25 mg total) by mouth 3 (three) times daily., Disp: 270 tablet, Rfl: 3   Insulin  Human (INSULIN  PUMP) SOLN, Inject into the skin continuous. , Disp: , Rfl:    Insulin  Pen Needle (PEN NEEDLES 31GX5/16") 31G X 8 MM MISC, , Disp: , Rfl:    JARDIANCE 10 MG TABS tablet, Take 5 mg by mouth every Monday, Wednesday, and Friday., Disp: , Rfl:    KERENDIA 10 MG TABS, Take 10 mg by mouth every Monday,  Wednesday, and Friday., Disp: , Rfl:    Lactulose  20 GM/30ML SOLN, Take 15 mLs (10 g total) by mouth 2 (two) times daily., Disp: 900 mL, Rfl: 3   levothyroxine  (SYNTHROID ) 112 MCG tablet, Take 112 mcg by mouth daily before breakfast., Disp: , Rfl:    lubiprostone  (AMITIZA ) 24 MCG capsule, Take 1 capsule (24 mcg total) by mouth 2 (two) times daily with a meal., Disp: 60 capsule, Rfl: 11   metFORMIN  (GLUCOPHAGE ) 500 MG tablet, Take 1 tablet (500 mg total) by mouth 2 (two) times daily with a meal., Disp: , Rfl:    nitroGLYCERIN  (NITROSTAT ) 0.4 MG SL tablet, Place 1 tablet (0.4 mg total) under the tongue every 5 (five) minutes as needed for chest pain., Disp: 25 tablet, Rfl: 3   NOVOLOG  100 UNIT/ML injection, Inject into the skin continuous. In insulin  pump, Disp: , Rfl: 0   pantoprazole  (PROTONIX ) 40 MG tablet, TAKE 1 TABLET BY MOUTH TWICE DAILY BEFORE A MEAL, Disp: 60 tablet, Rfl: 0   verapamil  (CALAN ) 80 MG tablet, Take 1 tablet (80 mg total) by mouth 3 (three) times daily., Disp: 90 tablet, Rfl: 5  Past Medical History: Past Medical History:  Diagnosis Date   Adult RDS (HCC)    Anemia    Brain aneurysm    frontal lobe   CAD (coronary artery disease)  a. 02/2018: s/p DES to Proximal LAD with residual 20% RCA stenosis.   Chronic pain    Diabetes mellitus without complication (HCC)    Headache    History of left bundle branch block (LBBB)    HTN (hypertension)    Hx of cardiovascular stress test 10/2016   intermediate risk study   Hypothyroidism    Neuromuscular disorder (HCC)    Neuropathy    Obesity    OSA on CPAP 03/10/2015   Renal insufficiency    Retinopathy    Sleep apnea    Varicose veins of both lower extremities     Tobacco Use: Social History   Tobacco Use  Smoking Status Never  Smokeless Tobacco Never    Labs: Review Flowsheet  More data exists      Latest Ref Rng & Units 07/23/2015 03/20/2018 04/16/2018 11/15/2019 06/28/2022  Labs for ITP Cardiac and  Pulmonary Rehab  Cholestrol 0 - 200 mg/dL - - 960  - -  LDL (calc) 0 - 99 mg/dL - - 54  - -  HDL-C >45 mg/dL - - 75  - -  Trlycerides <150 mg/dL - - 23  - -  Hemoglobin A1c 4.8 - 5.6 % 9.1  7.8  - 7.8  6.3   Bicarbonate 20.0 - 28.0 mmol/L - - - 13.2  -  Acid-base deficit 0.0 - 2.0 mmol/L - - - 12.4  -  O2 Saturation % - - - 31.8  -    Capillary Blood Glucose: Lab Results  Component Value Date   GLUCAP 75 10/11/2023   GLUCAP 22 (LL) 10/11/2023   GLUCAP 77 09/19/2023   GLUCAP 93 09/19/2023   GLUCAP 86 09/18/2023     Exercise Target Goals: Exercise Program Goal: Individual exercise prescription set using results from initial 6 min walk test and THRR while considering  patient's activity barriers and safety.   Exercise Prescription Goal: Starting with aerobic activity 30 plus minutes a day, 3 days per week for initial exercise prescription. Provide home exercise prescription and guidelines that participant acknowledges understanding prior to discharge.  Activity Barriers & Risk Stratification:  Activity Barriers & Cardiac Risk Stratification - 09/05/23 1307       Activity Barriers & Cardiac Risk Stratification   Activity Barriers History of Falls;Balance Concerns;Chest Pain/Angina;Assistive Device;Back Problems;Deconditioning;Shortness of Breath    Cardiac Risk Stratification High             6 Minute Walk:  6 Minute Walk     Row Name 09/10/23 0943         6 Minute Walk   Phase Initial     Distance 810 feet     Walk Time 6 minutes     # of Rest Breaks 1     MPH 1.53     METS 1.53     RPE 12     Perceived Dyspnea  1     VO2 Peak 5.37     Symptoms Yes (comment)     Comments one standing 30 second break     Resting HR 72 bpm     Resting BP 120/60     Resting Oxygen  Saturation  96 %     Exercise Oxygen  Saturation  during 6 min walk 97 %     Max Ex. HR 2.44 bpm     Max Ex. BP 156/64     2 Minute Post BP 140/60  Oxygen  Initial  Assessment:   Oxygen  Re-Evaluation:   Oxygen  Discharge (Final Oxygen  Re-Evaluation):   Initial Exercise Prescription:  Initial Exercise Prescription - 09/10/23 0900       Date of Initial Exercise RX and Referring Provider   Date 09/10/23    Referring Provider Armida Lander MD      NuStep   Level 2    SPM 50    Minutes 15    METs 2      Recumbant Elliptical   Level 2    RPM 50    Minutes 15    METs 3      Prescription Details   Frequency (times per week) 3    Duration Progress to 30 minutes of continuous aerobic without signs/symptoms of physical distress      Intensity   THRR 40-80% of Max Heartrate 106-139    Ratings of Perceived Exertion 11-13    Perceived Dyspnea 0-4      Resistance Training   Training Prescription Yes    Weight 3    Reps 10-15             Perform Capillary Blood Glucose checks as needed.  Exercise Prescription Changes:   Exercise Prescription Changes     Row Name 09/10/23 0900 09/19/23 1000 10/01/23 1200 10/05/23 0800 10/15/23 1500     Response to Exercise   Blood Pressure (Admit) 120/60 158/64 112/70 -- 110/70   Blood Pressure (Exercise) 156/64 178/80 140/70 -- --   Blood Pressure (Exit) 140/60 132/68 120/70 -- 112/56   Heart Rate (Admit) 72 bpm 90 bpm 93 bpm -- 90 bpm   Heart Rate (Exercise) 94 bpm 119 bpm 96 bpm -- 107 bpm   Heart Rate (Exit) 76 bpm 99 bpm 78 bpm -- 83 bpm   Oxygen  Saturation (Admit) 96 % -- -- -- --   Oxygen  Saturation (Exercise) 97 % -- -- -- --   Oxygen  Saturation (Exit) 97 % -- -- -- --   Rating of Perceived Exertion (Exercise) 12 11 12  -- 11   Perceived Dyspnea (Exercise) 1 -- -- -- --   Duration Progress to 30 minutes of  aerobic without signs/symptoms of physical distress Continue with 30 min of aerobic exercise without signs/symptoms of physical distress. Continue with 30 min of aerobic exercise without signs/symptoms of physical distress. -- Continue with 30 min of aerobic exercise without  signs/symptoms of physical distress.   Intensity -- THRR unchanged THRR unchanged -- THRR unchanged     Progression   Progression -- Continue to progress workloads to maintain intensity without signs/symptoms of physical distress. Continue to progress workloads to maintain intensity without signs/symptoms of physical distress. -- Continue to progress workloads to maintain intensity without signs/symptoms of physical distress.     Resistance Training   Training Prescription -- Yes Yes -- Yes   Weight -- 5 5 -- 5   Reps -- 10-15 10-15 -- 10-15     NuStep   Level -- 4 5 -- 5   SPM -- 56 117 -- 138   Minutes -- 15 15 -- 15   METs -- 2.1 2.4 -- 2.3     Recumbant Elliptical   Level -- 4 4 -- 5   RPM -- 122 48 -- 47   Minutes -- 15 15 -- 15   METs -- 2.5 3.1 -- 4.3     Home Exercise Plan   Plans to continue exercise at -- -- -- Home (comment)  walking, treadmill,  bike Home (comment)   Frequency -- -- -- Add 2 additional days to program exercise sessions. Add 2 additional days to program exercise sessions.   Initial Home Exercises Provided -- -- -- 10/05/23 --    Row Name 10/29/23 1300 11/16/23 1300           Response to Exercise   Blood Pressure (Admit) 126/72 136/70      Blood Pressure (Exit) 116/66 128/60      Heart Rate (Admit) 86 bpm 81 bpm      Heart Rate (Exercise) 112 bpm 101 bpm      Heart Rate (Exit) 86 bpm 87 bpm      Rating of Perceived Exertion (Exercise) 12 15      Duration Continue with 30 min of aerobic exercise without signs/symptoms of physical distress. Continue with 30 min of aerobic exercise without signs/symptoms of physical distress.      Intensity THRR unchanged THRR unchanged        Progression   Progression Continue to progress workloads to maintain intensity without signs/symptoms of physical distress. Continue to progress workloads to maintain intensity without signs/symptoms of physical distress.        Resistance Training   Training Prescription  Yes Yes      Weight 5 5      Reps 10-15 10-15        NuStep   Level 5 5      SPM 185 129      Minutes 15 15      METs 2.5 2.5        Recumbant Elliptical   Level 3 3      RPM 51 50      Minutes 15 15      METs 3.5 3.4        Home Exercise Plan   Plans to continue exercise at Home (comment) Home (comment)      Frequency Add 2 additional days to program exercise sessions. Add 2 additional days to program exercise sessions.               Exercise Comments:   Exercise Comments     Row Name 10/10/23 0803           Exercise Comments Jodi Ray is sympotmatic with frequent PVCs with SOB.  Dr. Amanda Jungling increased her coreg  that she will pick up today.                Exercise Goals and Review:   Exercise Goals     Row Name 09/10/23 0948             Exercise Goals   Increase Physical Activity Yes       Intervention Provide advice, education, support and counseling about physical activity/exercise needs.;Develop an individualized exercise prescription for aerobic and resistive training based on initial evaluation findings, risk stratification, comorbidities and participant's personal goals.       Expected Outcomes Short Term: Attend rehab on a regular basis to increase amount of physical activity.;Long Term: Add in home exercise to make exercise part of routine and to increase amount of physical activity.;Long Term: Exercising regularly at least 3-5 days a week.       Increase Strength and Stamina Yes       Intervention Provide advice, education, support and counseling about physical activity/exercise needs.;Develop an individualized exercise prescription for aerobic and resistive training based on initial evaluation findings, risk stratification, comorbidities and participant's personal goals.  Expected Outcomes Short Term: Increase workloads from initial exercise prescription for resistance, speed, and METs.;Short Term: Perform resistance training exercises routinely  during rehab and add in resistance training at home;Long Term: Improve cardiorespiratory fitness, muscular endurance and strength as measured by increased METs and functional capacity ( )       Able to understand and use rate of perceived exertion (RPE) scale Yes       Intervention Provide education and explanation on how to use RPE scale       Expected Outcomes Short Term: Able to use RPE daily in rehab to express subjective intensity level;Long Term:  Able to use RPE to guide intensity level when exercising independently       Able to understand and use Dyspnea scale Yes       Intervention Provide education and explanation on how to use Dyspnea scale       Expected Outcomes Short Term: Able to use Dyspnea scale daily in rehab to express subjective sense of shortness of breath during exertion;Long Term: Able to use Dyspnea scale to guide intensity level when exercising independently       Knowledge and understanding of Target Heart Rate Range (THRR) Yes       Intervention Provide education and explanation of THRR including how the numbers were predicted and where they are located for reference       Expected Outcomes Short Term: Able to state/look up THRR;Long Term: Able to use THRR to govern intensity when exercising independently;Short Term: Able to use daily as guideline for intensity in rehab       Able to check pulse independently Yes       Intervention Provide education and demonstration on how to check pulse in carotid and radial arteries.;Review the importance of being able to check your own pulse for safety during independent exercise       Expected Outcomes Long Term: Able to check pulse independently and accurately;Short Term: Able to explain why pulse checking is important during independent exercise       Understanding of Exercise Prescription Yes       Intervention Provide education, explanation, and written materials on patient's individual exercise prescription       Expected  Outcomes Short Term: Able to explain program exercise prescription;Long Term: Able to explain home exercise prescription to exercise independently                Exercise Goals Re-Evaluation :  Exercise Goals Re-Evaluation     Row Name 09/19/23 1002 10/05/23 0813 10/29/23 1332         Exercise Goal Re-Evaluation   Exercise Goals Review Increase Physical Activity;Increase Strength and Stamina;Understanding of Exercise Prescription Increase Physical Activity;Increase Strength and Stamina;Able to understand and use Dyspnea scale;Able to understand and use rate of perceived exertion (RPE) scale;Knowledge and understanding of Target Heart Rate Range (THRR);Able to check pulse independently;Understanding of Exercise Prescription Increase Physical Activity;Increase Strength and Stamina;Understanding of Exercise Prescription     Comments Jodi Ray is doijg well in rehab and tolerating exercise well. She has recently noticed her angina the past couple days when exercisng and is going to ask her cardiologist about taking her Nitro. Will continue to monitor and update as able. Jodi Ray is doing well in rehab. She is working on pushing through her angina and we have a plan she pushes to 6/10 and then backs off or rest to recover then go again. Reviewed home exercise with pt today.  Pt plans to walk with  dog at home for exercise.  She also has a treadmill and stationary bike at home as well.  Reviewed THR, pulse, RPE, sign and symptoms, pulse oximetery and when to call 911 or MD.  Also discussed weather considerations and indoor options.  Pt voiced understanding. Jodi Ray is doing well in rehab and on her 23rd visit. She increases her workloads or spm every week. She enjoys coming to rehab for teh socail aspect as well and is encourging to other patients. She is exercising at a level 5 on the nustep with 185 spm. Will continue to montior and progress as able     Expected Outcomes continue to attend rehab SHort; Start to  add in more at home Long; Continue to push anginal threshold continue to attend rehab               Discharge Exercise Prescription (Final Exercise Prescription Changes):  Exercise Prescription Changes - 11/16/23 1300       Response to Exercise   Blood Pressure (Admit) 136/70    Blood Pressure (Exit) 128/60    Heart Rate (Admit) 81 bpm    Heart Rate (Exercise) 101 bpm    Heart Rate (Exit) 87 bpm    Rating of Perceived Exertion (Exercise) 15    Duration Continue with 30 min of aerobic exercise without signs/symptoms of physical distress.    Intensity THRR unchanged      Progression   Progression Continue to progress workloads to maintain intensity without signs/symptoms of physical distress.      Resistance Training   Training Prescription Yes    Weight 5    Reps 10-15      NuStep   Level 5    SPM 129    Minutes 15    METs 2.5      Recumbant Elliptical   Level 3    RPM 50    Minutes 15    METs 3.4      Home Exercise Plan   Plans to continue exercise at Home (comment)    Frequency Add 2 additional days to program exercise sessions.             Nutrition:  Target Goals: Understanding of nutrition guidelines, daily intake of sodium 1500mg , cholesterol 200mg , calories 30% from fat and 7% or less from saturated fats, daily to have 5 or more servings of fruits and vegetables.  Biometrics:  Pre Biometrics - 09/10/23 0949       Pre Biometrics   Height 5' 6.5" (1.689 m)    Weight 272 lb 14.9 oz (123.8 kg)    Waist Circumference 46 inches    Hip Circumference 54 inches    Waist to Hip Ratio 0.85 %    BMI (Calculated) 43.4    Grip Strength 28 kg              Nutrition Therapy Plan and Nutrition Goals:   Nutrition Assessments:  MEDIFICTS Score Key: >=70 Need to make dietary changes  40-70 Heart Healthy Diet <= 40 Therapeutic Level Cholesterol Diet  Flowsheet Row CARDIAC REHAB PHASE II EXERCISE from 09/14/2023 in [facility] CARDIAC  REHABILITATION  Picture Your Plate Total Score on Admission 7      Picture Your Plate Scores: <16 Unhealthy dietary pattern with much room for improvement. 41-50 Dietary pattern unlikely to meet recommendations for good health and room for improvement. 51-60 More healthful dietary pattern, with some room for improvement.  >60 Healthy dietary pattern, although there may be some  specific behaviors that could be improved.    Nutrition Goals Re-Evaluation:  Nutrition Goals Re-Evaluation     Row Name 10/05/23 0823             Goals   Nutrition Goal Heart Healthy Diet       Comment Jodi Ray meets with a dietitian every month at her endocrinologist office.  She eats healthy and tries to stick to it.  She eats a good variety of fruits and vegetables.  She has been watching her kidneys too so we will limit her protein.  She is pleased with how well she is eating and watching her portions.       Expected Outcome Short: Conitnue to meet Long; Continue to eat healthy.                Nutrition Goals Discharge (Final Nutrition Goals Re-Evaluation):  Nutrition Goals Re-Evaluation - 10/05/23 0823       Goals   Nutrition Goal Heart Healthy Diet    Comment Jodi Ray meets with a dietitian every month at her endocrinologist office.  She eats healthy and tries to stick to it.  She eats a good variety of fruits and vegetables.  She has been watching her kidneys too so we will limit her protein.  She is pleased with how well she is eating and watching her portions.    Expected Outcome Short: Conitnue to meet Long; Continue to eat healthy.             Psychosocial: Target Goals: Acknowledge presence or absence of significant depression and/or stress, maximize coping skills, provide positive support system. Participant is able to verbalize types and ability to use techniques and skills needed for reducing stress and depression.  Initial Review & Psychosocial Screening:  Initial Psych Review &  Screening - 09/05/23 1326       Initial Review   Current issues with None Identified      Family Dynamics   Good Support System? Yes      Barriers   Psychosocial barriers to participate in program There are no identifiable barriers or psychosocial needs.      Screening Interventions   Interventions Encouraged to exercise;To provide support and resources with identified psychosocial needs;Provide feedback about the scores to participant    Expected Outcomes Short Term goal: Identification and review with participant of any Quality of Life or Depression concerns found by scoring the questionnaire.;Long Term goal: The participant improves quality of Life and PHQ9 Scores as seen by post scores and/or verbalization of changes;Long Term Goal: Stressors or current issues are controlled or eliminated.;Short Term goal: Utilizing psychosocial counselor, staff and physician to assist with identification of specific Stressors or current issues interfering with healing process. Setting desired goal for each stressor or current issue identified.             Quality of Life Scores:  Quality of Life - 09/14/23 1231       Quality of Life   Select Quality of Life      Quality of Life Scores   Health/Function Pre 24.32 %    Socioeconomic Pre 25.75 %    Psych/Spiritual Pre 28.43 %    Family Pre 19.5 %    GLOBAL Pre 24.9 %            Scores of 19 and below usually indicate a poorer quality of life in these areas.  A difference of  2-3 points is a clinically meaningful difference.  A difference  of 2-3 points in the total score of the Quality of Life Index has been associated with significant improvement in overall quality of life, self-image, physical symptoms, and general health in studies assessing change in quality of life.  PHQ-9: Review Flowsheet  More data exists      09/10/2023 09/26/2018 06/17/2018 04/09/2017 08/04/2015  Depression screen PHQ 2/9  Decreased Interest 0 0 0 0 0  Down,  Depressed, Hopeless 0 0 0 0 0  PHQ - 2 Score 0 0 0 0 0  Altered sleeping 0 3 0 - -  Tired, decreased energy 1 0 0 - -  Change in appetite 0 0 0 - -  Feeling bad or failure about yourself  0 0 0 - -  Trouble concentrating 0 0 0 - -  Moving slowly or fidgety/restless 0 0 0 - -  Suicidal thoughts 0 0 0 - -  PHQ-9 Score 1 3 0 - -  Difficult doing work/chores Not difficult at all Not difficult at all Not difficult at all - -   Interpretation of Total Score  Total Score Depression Severity:  1-4 = Minimal depression, 5-9 = Mild depression, 10-14 = Moderate depression, 15-19 = Moderately severe depression, 20-27 = Severe depression   Psychosocial Evaluation and Intervention:  Psychosocial Evaluation - 09/05/23 1326       Psychosocial Evaluation & Interventions   Interventions Stress management education;Relaxation education;Encouraged to exercise with the program and follow exercise prescription    Comments Patient was referred to CR with stable angina. She has participated in our program pre Covid and joined our maintenance program. She was exercising at the [facility] but has had a lot of stressors in her life and she stopped exercising and then started having angina. She did have a heart cath and medical management was the treatment recommendation. She did not share what her stressors were but did say she was seeing a counselor routinely and he had her on an antidepressant but this has been discontinued and she thinks her last visit with him will be the last one. She says the stressful situations have improved. She denies any depression or anxiety. She says she sleeps well at night. She says she has a great support system with her boyfriend and her church family. She is very excited to be participating in the program again. Her main goals for the program are to lose weight; get back into an exercise routine and improve her angina symptoms. She has no barriers identified to participate in the program.     Expected Outcomes Short Term: Start the program and attend consistently. Long Term: Complete the program meeting her personal goals.    Continue Psychosocial Services  Follow up required by staff             Psychosocial Re-Evaluation:  Psychosocial Re-Evaluation     Row Name 10/05/23 0818             Psychosocial Re-Evaluation   Current issues with Current Stress Concerns       Comments Jodi Ray is doing well in rehab.  Her biggest stressor is her angina. She is working to push her thresholds. She has been going through several follow up MD appts and adjustments to her medications. She sleeps well at night, her dog will wake her up to play at night on occassion.       Expected Outcomes Short; Attend rehab to push anginal threshold Long; Continue to stay positive  Interventions Stress management education;Encouraged to attend Cardiac Rehabilitation for the exercise;Relaxation education                Psychosocial Discharge (Final Psychosocial Re-Evaluation):  Psychosocial Re-Evaluation - 10/05/23 0818       Psychosocial Re-Evaluation   Current issues with Current Stress Concerns    Comments Jodi Ray is doing well in rehab.  Her biggest stressor is her angina. She is working to push her thresholds. She has been going through several follow up MD appts and adjustments to her medications. She sleeps well at night, her dog will wake her up to play at night on occassion.    Expected Outcomes Short; Attend rehab to push anginal threshold Long; Continue to stay positive    Interventions Stress management education;Encouraged to attend Cardiac Rehabilitation for the exercise;Relaxation education             Vocational Rehabilitation: Provide vocational rehab assistance to qualifying candidates.   Vocational Rehab Evaluation & Intervention:  Vocational Rehab - 09/05/23 1325       Initial Vocational Rehab Evaluation & Intervention   Assessment shows need for Vocational  Rehabilitation No      Vocational Rehab Re-Evaulation   Comments Patient is disabled/retired.             Education: Education Goals: Education classes will be provided on a weekly basis, covering required topics. Participant will state understanding/return demonstration of topics presented.  Learning Barriers/Preferences:  Learning Barriers/Preferences - 09/05/23 1312       Learning Barriers/Preferences   Learning Barriers None    Learning Preferences Audio;Computer/Internet;Group Instruction;Individual Instruction;Pictoral;Skilled Demonstration;Written Material             Education Topics: Hypertension, Hypertension Reduction -Define heart disease and high blood pressure. Discus how high blood pressure affects the body and ways to reduce high blood pressure. Flowsheet Row CARDIAC REHAB PHASE II EXERCISE from 11/14/2023 in [place] CARDIAC REHABILITATION  Date 10/17/23  Educator [facility]  Instruction Review Code 1- Verbalizes Understanding       Exercise and Your Heart -Discuss why it is important to exercise, the FITT principles of exercise, normal and abnormal responses to exercise, and how to exercise safely. Flowsheet Row CARDIAC REHAB PHASE II EXERCISE from 11/14/2023 in [place] CARDIAC REHABILITATION  Date 11/14/23  Educator HB  Instruction Review Code 1- Verbalizes Understanding       Angina -Discuss definition of angina, causes of angina, treatment of angina, and how to decrease risk of having angina. Flowsheet Row CARDIAC REHAB PHASE II EXERCISE from 11/14/2023 in [place] CARDIAC REHABILITATION  Date 11/07/23  Robynn Christian and Procedures]  Educator [facility]  Instruction Review Code 1- Verbalizes Understanding       Cardiac Medications -Review what the following cardiac medications are used for, how they affect the body, and side effects that may occur when taking the medications.  Medications include Aspirin , Beta blockers, calcium  channel blockers, ACE  Inhibitors, angiotensin receptor blockers, diuretics, digoxin, and antihyperlipidemics. Flowsheet Row CARDIAC REHAB PHASE II EXERCISE from 11/14/2023 in [place] CARDIAC REHABILITATION  Date 10/24/23  Educator HB  Instruction Review Code 1- Verbalizes Understanding       Congestive Heart Failure -Discuss the definition of CHF, how to live with CHF, the signs and symptoms of CHF, and how keep track of weight and sodium intake. Flowsheet Row CARDIAC REHAB PHASE II EXERCISE from 11/14/2023 in [place] CARDIAC REHABILITATION  Date 10/17/23  Educator [facility]  Instruction Review Code 1- Verbalizes Understanding  Heart Disease and Intimacy -Discus the effect sexual activity has on the heart, how changes occur during intimacy as we age, and safety during sexual activity. Flowsheet Row CARDIAC REHAB PHASE II EXERCISE from 11/14/2023 in [place] CARDIAC REHABILITATION  Date 09/19/23  Educator jh  Instruction Review Code 1- Verbalizes Understanding       Smoking Cessation / COPD -Discuss different methods to quit smoking, the health benefits of quitting smoking, and the definition of COPD. Flowsheet Row CARDIAC REHAB PHASE II EXERCISE from 09/25/2018 in [place] CARDIAC REHABILITATION  Date 07/17/18  Educator Katha Palau   Instruction Review Code 2- Demonstrated Understanding       Nutrition I: Fats -Discuss the types of cholesterol, what cholesterol does to the heart, and how cholesterol levels can be controlled.   Nutrition II: Labels -Discuss the different components of food labels and how to read food label Flowsheet Row CARDIAC REHAB PHASE II EXERCISE from 09/25/2018 in [place] CARDIAC REHABILITATION  Date 08/07/18  Educator DC  Instruction Review Code 2- Demonstrated Understanding       Heart Parts/Heart Disease and PAD -Discuss the anatomy of the heart, the pathway of blood circulation through the heart, and these are affected by heart disease. Flowsheet Row  CARDIAC REHAB PHASE II EXERCISE from 09/25/2018 in [place] CARDIAC REHABILITATION  Date 08/02/18  Educator Lincoln Renshaw  Instruction Review Code 2- Demonstrated Understanding       Stress I: Signs and Symptoms -Discuss the causes of stress, how stress may lead to anxiety and depression, and ways to limit stress. Flowsheet Row CARDIAC REHAB PHASE II EXERCISE from 11/14/2023 in [place] CARDIAC REHABILITATION  Date 10/10/23  Educator [facility]  Instruction Review Code 1- Verbalizes Understanding       Stress II: Relaxation -Discuss different types of relaxation techniques to limit stress.   Warning Signs of Stroke / TIA -Discuss definition of a stroke, what the signs and symptoms are of a stroke, and how to identify when someone is having stroke. Flowsheet Row CARDIAC REHAB PHASE II EXERCISE from 09/25/2018 in [place] CARDIAC REHABILITATION  Date 08/28/18  Educator DJ  Instruction Review Code 2- Demonstrated Understanding       Knowledge Questionnaire Score:  Knowledge Questionnaire Score - 09/14/23 0846       Knowledge Questionnaire Score   Pre Score 23/24             Core Components/Risk Factors/Patient Goals at Admission:  Personal Goals and Risk Factors at Admission - 09/05/23 1325       Core Components/Risk Factors/Patient Goals on Admission    Weight Management Obesity    Improve shortness of breath with ADL's Yes    Intervention Provide education, individualized exercise plan and daily activity instruction to help decrease symptoms of SOB with activities of daily living.    Expected Outcomes Short Term: Improve cardiorespiratory fitness to achieve a reduction of symptoms when performing ADLs;Long Term: Be able to perform more ADLs without symptoms or delay the onset of symptoms    Diabetes Yes    Intervention Provide education about signs/symptoms and action to take for hypo/hyperglycemia.;Provide education about proper nutrition, including hydration, and  aerobic/resistive exercise prescription along with prescribed medications to achieve blood glucose in normal ranges: Fasting glucose 65-99 mg/dL    Expected Outcomes Short Term: Participant verbalizes understanding of the signs/symptoms and immediate care of hyper/hypoglycemia, proper foot care and importance of medication, aerobic/resistive exercise and nutrition plan for blood glucose control.;Long Term: Attainment of  HbA1C < 7%.    Heart Failure Yes    Intervention Provide a combined exercise and nutrition program that is supplemented with education, support and counseling about heart failure. Directed toward relieving symptoms such as shortness of breath, decreased exercise tolerance, and extremity edema.    Expected Outcomes Improve functional capacity of life;Short term: Attendance in program 2-3 days a week with increased exercise capacity. Reported lower sodium intake. Reported increased fruit and vegetable intake. Reports medication compliance.;Short term: Daily weights obtained and reported for increase. Utilizing diuretic protocols set by physician.;Long term: Adoption of self-care skills and reduction of barriers for early signs and symptoms recognition and intervention leading to self-care maintenance.    Hypertension Yes    Intervention Provide education on lifestyle modifcations including regular physical activity/exercise, weight management, moderate sodium restriction and increased consumption of fresh fruit, vegetables, and low fat dairy, alcohol  moderation, and smoking cessation.;Monitor prescription use compliance.    Expected Outcomes Short Term: Continued assessment and intervention until BP is < 140/84mm HG in hypertensive participants. < 130/55mm HG in hypertensive participants with diabetes, heart failure or chronic kidney disease.;Long Term: Maintenance of blood pressure at goal levels.    Lipids Yes    Intervention Provide education and support for participant on nutrition &  aerobic/resistive exercise along with prescribed medications to achieve LDL 70mg , HDL >40mg .    Expected Outcomes Short Term: Participant states understanding of desired cholesterol values and is compliant with medications prescribed. Participant is following exercise prescription and nutrition guidelines.;Long Term: Cholesterol controlled with medications as prescribed, with individualized exercise RX and with personalized nutrition plan. Value goals: LDL < 70mg , HDL > 40 mg.             Core Components/Risk Factors/Patient Goals Review:   Goals and Risk Factor Review     Row Name 10/05/23 0826             Core Components/Risk Factors/Patient Goals Review   Personal Goals Review Weight Management/Obesity;Diabetes;Heart Failure;Lipids;Hypertension       Review Jodi Ray is doing well in rehab.  Her weight is trending down.  She eats healthy and limits her portions.  She has not had any heart failure symptoms currently.  She is working on pushing her anginal thresholds.  Her sugars are doing well and she stops her pump during class.  Her blood pressures are doing well, she called out Wednesday with a low BP.  She keeps a close eye on it and checks it frequently.       Expected Outcomes Short: Continue to work on weight loss long; Continue to keep eye on heart failure and angina                Core Components/Risk Factors/Patient Goals at Discharge (Final Review):   Goals and Risk Factor Review - 10/05/23 0826       Core Components/Risk Factors/Patient Goals Review   Personal Goals Review Weight Management/Obesity;Diabetes;Heart Failure;Lipids;Hypertension    Review Jodi Ray is doing well in rehab.  Her weight is trending down.  She eats healthy and limits her portions.  She has not had any heart failure symptoms currently.  She is working on pushing her anginal thresholds.  Her sugars are doing well and she stops her pump during class.  Her blood pressures are doing well, she called out  Wednesday with a low BP.  She keeps a close eye on it and checks it frequently.    Expected Outcomes Short: Continue to work on weight loss  long; Continue to keep eye on heart failure and angina             ITP Comments:  ITP Comments     Row Name 09/05/23 1337 09/10/23 0943 09/26/23 0800 10/24/23 0801 11/21/23 0817   ITP Comments Virtual visit completed 09/05/23. Her orientation visit is scheduled for Monday 2/10 at 8am. Patient arrived for 1st visit/orientation/education at 0800. Patient was referred to CR by Dr. Armida Lander due to Stable Angina. During orientation advised patient on arrival and appointment times what to wear, what to do before, during and after exercise. Reviewed attendance and class policy.  Pt is scheduled to return Cardiac Rehab on 09/12/23 at 745. Pt was advised to come to class 15 minutes before class starts.  Discussed RPE/Dpysnea scales. Patient participated in warm up stretches. Patient was able to complete 6 minute walk test.  She did report chest tightness 7/10 during walk test. It subsided to 2/10 after rest. She stated it was not bad enough to take a NTG. Telemetry:SR. Patient was measured for the equipment. Discussed equipment safety with patient. Took patient pre-anthropometric 30 day review completed. ITP sent to Dr. Armida Lander, Medical Director of Cardiac Rehab. Continue with ITP unless changes are made by physician. Still newer to program.  Working on adjusting workloads with her anginal symptoms. 30 day review completed. ITP sent to Dr. Armida Lander, Medical Director of Cardiac Rehab. Continue with ITP unless changes are made by physician. 30 day review completed. ITP sent to Dr. Armida Lander, Medical Director of Cardiac Rehab. Continue with ITP unless changes are made by physician.  Jodi Ray was admitted to hospital for possible stroke and low blood sugar, awaiting clearance to return to rehab.            Comments: 30 day review

## 2023-11-22 ENCOUNTER — Telehealth (HOSPITAL_COMMUNITY): Payer: Self-pay | Admitting: *Deleted

## 2023-11-22 DIAGNOSIS — E039 Hypothyroidism, unspecified: Secondary | ICD-10-CM | POA: Diagnosis not present

## 2023-11-22 DIAGNOSIS — M1460 Charcot's joint, unspecified site: Secondary | ICD-10-CM | POA: Diagnosis not present

## 2023-11-22 DIAGNOSIS — Z9641 Presence of insulin pump (external) (internal): Secondary | ICD-10-CM | POA: Diagnosis not present

## 2023-11-22 DIAGNOSIS — E11319 Type 2 diabetes mellitus with unspecified diabetic retinopathy without macular edema: Secondary | ICD-10-CM | POA: Diagnosis not present

## 2023-11-22 DIAGNOSIS — E1140 Type 2 diabetes mellitus with diabetic neuropathy, unspecified: Secondary | ICD-10-CM | POA: Diagnosis not present

## 2023-11-22 DIAGNOSIS — Z794 Long term (current) use of insulin: Secondary | ICD-10-CM | POA: Diagnosis not present

## 2023-11-22 DIAGNOSIS — E109 Type 1 diabetes mellitus without complications: Secondary | ICD-10-CM | POA: Diagnosis not present

## 2023-11-22 DIAGNOSIS — I10 Essential (primary) hypertension: Secondary | ICD-10-CM | POA: Diagnosis not present

## 2023-11-22 DIAGNOSIS — E118 Type 2 diabetes mellitus with unspecified complications: Secondary | ICD-10-CM | POA: Diagnosis not present

## 2023-11-22 DIAGNOSIS — E785 Hyperlipidemia, unspecified: Secondary | ICD-10-CM | POA: Diagnosis not present

## 2023-11-22 DIAGNOSIS — N1832 Chronic kidney disease, stage 3b: Secondary | ICD-10-CM | POA: Diagnosis not present

## 2023-11-22 NOTE — Telephone Encounter (Signed)
 Jodi Ray called to see if she could return to rehab.  Her endocrinologist cleared her and spoke to Dr. Amanda Jungling her cardiologist and he was okay with returning to rehab tomorrow.

## 2023-11-23 ENCOUNTER — Encounter (HOSPITAL_COMMUNITY)
Admission: RE | Admit: 2023-11-23 | Discharge: 2023-11-23 | Disposition: A | Discharge status: Home / Self Care | Payer: Medicare Other | Source: Ambulatory Visit | Attending: Cardiology | Admitting: Cardiology

## 2023-11-23 DIAGNOSIS — I2089 Other forms of angina pectoris: Secondary | ICD-10-CM | POA: Diagnosis not present

## 2023-11-23 LAB — GLUCOSE, CAPILLARY: Glucose-Capillary: 223 mg/dL — ABNORMAL HIGH (ref 70–99)

## 2023-11-23 NOTE — Progress Notes (Signed)
 Daily Session Note  Patient Details  Name: Jodi Ray MRN: [id number] Date of Birth: Dec 26, 1960 Referring Provider:   Flowsheet Row CARDIAC REHAB PHASE II ORIENTATION from 09/10/2023 in [facility] CARDIAC REHABILITATION  Referring Provider Armida Lander MD       Encounter Date: 11/23/2023  Check In:  Session Check In - 11/23/23 0752       Check-In   Supervising physician immediately available to respond to emergencies See telemetry face sheet for immediately available MD    Location [facility]    Staff Present Theone Fitting, RN, BSN, MA;Disney Ruggiero Annette Barters, BSN, RN, Tisha Forget, RN, BSN    Virtual Visit No    Medication changes reported     Yes    Comments Patient was recently hospitalized. She states they took her off all her medications except synthroid  and insulin  pump.    Fall or balance concerns reported    No    Tobacco Cessation No Change    Warm-up and Cool-down Performed on first and last piece of equipment    Resistance Training Performed Yes    VAD Patient? No    PAD/SET Patient? No      Pain Assessment   Currently in Pain? No/denies             Capillary Blood Glucose: No results found for this or any previous visit (from the past 24 hours).    Social History   Tobacco Use  Smoking Status Never  Smokeless Tobacco Never    Goals Met:  Independence with exercise equipment Exercise tolerated well No report of concerns or symptoms today Strength training completed today  Goals Unmet:  Not Applicable  Comments: Pt able to follow exercise prescription today without complaint.  Will continue to monitor for progression.

## 2023-11-26 ENCOUNTER — Encounter (HOSPITAL_COMMUNITY)
Admission: RE | Admit: 2023-11-26 | Discharge: 2023-11-26 | Disposition: A | Discharge status: Home / Self Care | Payer: Medicare Other | Source: Ambulatory Visit | Attending: Cardiology

## 2023-11-26 VITALS — Ht 66.5 in | Wt 258.9 lb

## 2023-11-26 DIAGNOSIS — I2089 Other forms of angina pectoris: Secondary | ICD-10-CM

## 2023-11-26 NOTE — Progress Notes (Signed)
 Discharge Progress Report  Patient Details  Name: Jodi Ray MRN: [id number] Date of Birth: [birth year] Referring Provider:   Flowsheet Row CARDIAC REHAB PHASE II ORIENTATION from 09/10/2023 in [facility] CARDIAC REHABILITATION  Referring Provider Armida Lander MD        Number of Visits: 36  Reason for Discharge:    Smoking History:  Social History   Tobacco Use  Smoking Status Never  Smokeless Tobacco Never    Diagnosis:  Stable angina [facility])  Initial Exercise Prescription:  Initial Exercise Prescription - 09/10/23 0900       Date of Initial Exercise RX and Referring Provider   Date 09/10/23    Referring Provider Armida Lander MD      NuStep   Level 2    SPM 50    Minutes 15    METs 2      Recumbant Elliptical   Level 2    RPM 50    Minutes 15    METs 3      Prescription Details   Frequency (times per week) 3    Duration Progress to 30 minutes of continuous aerobic without signs/symptoms of physical distress      Intensity   THRR 40-80% of Max Heartrate 106-139    Ratings of Perceived Exertion 11-13    Perceived Dyspnea 0-4      Resistance Training   Training Prescription Yes    Weight 3    Reps 10-15             Discharge Exercise Prescription (Final Exercise Prescription Changes):  Exercise Prescription Changes - 11/16/23 1300       Response to Exercise   Blood Pressure (Admit) 136/70    Blood Pressure (Exit) 128/60    Heart Rate (Admit) 81 bpm    Heart Rate (Exercise) 101 bpm    Heart Rate (Exit) 87 bpm    Rating of Perceived Exertion (Exercise) 15    Duration Continue with 30 min of aerobic exercise without signs/symptoms of physical distress.    Intensity THRR unchanged      Progression   Progression Continue to progress workloads to maintain intensity without signs/symptoms of physical distress.      Resistance Training   Training Prescription Yes    Weight 5    Reps 10-15      NuStep   Level 5    SPM 129     Minutes 15    METs 2.5      Recumbant Elliptical   Level 3    RPM 50    Minutes 15    METs 3.4      Home Exercise Plan   Plans to continue exercise at Home (comment)    Frequency Add 2 additional days to program exercise sessions.             Functional Capacity:  6 Minute Walk     Row Name 09/10/23 0943 11/26/23 0808       6 Minute Walk   Phase Initial Discharge    Distance 810 feet 890 feet    Distance % Change -- 3.7 %    Distance Feet Change -- 80 ft    Walk Time 6 minutes 6 minutes    # of Rest Breaks 1 0    MPH 1.53 1.69    METS 1.53 1.87    RPE 12 12    Perceived Dyspnea  1 1    VO2 Peak 5.37 6.56  Symptoms Yes (comment) Yes (comment)    Comments one standing 30 second break SOB, felt like working harder    Resting HR 72 bpm 57 bpm    Resting BP 120/60 132/70    Resting Oxygen  Saturation  96 % --    Exercise Oxygen  Saturation  during 6 min walk 97 % --    Max Ex. HR 2.44 bpm 109 bpm    Max Ex. BP 156/64 146/62    2 Minute Post BP 140/60 --             Psychological, QOL, Others - Outcomes: PHQ 2/9:    09/10/2023    9:36 AM 09/26/2018    3:59 PM 06/17/2018    3:09 PM 04/09/2017    9:12 AM 08/04/2015   10:09 AM  Depression screen PHQ 2/9  Decreased Interest 0 0 0 0 0  Down, Depressed, Hopeless 0 0 0 0 0  PHQ - 2 Score 0 0 0 0 0  Altered sleeping 0 3 0    Tired, decreased energy 1 0 0    Change in appetite 0 0 0    Feeling bad or failure about yourself  0 0 0    Trouble concentrating 0 0 0    Moving slowly or fidgety/restless 0 0 0    Suicidal thoughts 0 0 0    PHQ-9 Score 1 3 0    Difficult doing work/chores Not difficult at all Not difficult at all Not difficult at all      Nutrition & Weight - Outcomes:  Pre Biometrics - 09/10/23 0949       Pre Biometrics   Height 5' 6.5" (1.689 m)    Weight 123.8 kg    Waist Circumference 46 inches    Hip Circumference 54 inches    Waist to Hip Ratio 0.85 %    BMI (Calculated) 43.4     Grip Strength 28 kg             Post Biometrics - 11/26/23 0809        Post  Biometrics   Height 5' 6.5" (1.689 m)    Weight 117.4 kg    Waist Circumference 41.5 inches    Hip Circumference 53 inches    Waist to Hip Ratio 0.78 %    BMI (Calculated) 41.17    Grip Strength 27 kg    Single Leg Stand 0 seconds

## 2023-11-26 NOTE — Progress Notes (Addendum)
 Cardiac Individual Treatment Plan  Patient Details  Name: Jodi Ray MRN: [id number] Date of Birth: [birth year] Referring Provider:   Flowsheet Row CARDIAC REHAB PHASE II ORIENTATION from 09/10/2023 in [facility] CARDIAC REHABILITATION  Referring Provider Armida Lander MD       Initial Encounter Date:  Flowsheet Row CARDIAC REHAB PHASE II ORIENTATION from 09/10/2023 in [place] CARDIAC REHABILITATION  Date 09/10/23       Visit Diagnosis: Stable angina (HCC)  Patient's Home Medications on Admission:  Current Outpatient Medications:    ALPRAZolam (XANAX) 0.5 MG tablet, Take 0.5 mg by mouth once a week., Disp: , Rfl:    aspirin  EC 81 MG tablet, Take 1 tablet (81 mg total) by mouth daily. Swallow whole., Disp: 90 tablet, Rfl: 3   atorvastatin  (LIPITOR) 80 MG tablet, TAKE 1 TABLET BY MOUTH ONCE DAILY AT  6PM, Disp: 90 tablet, Rfl: 3   calcitRIOL (ROCALTROL) 0.25 MCG capsule, Take 0.25 mcg by mouth every Monday, Wednesday, and Friday., Disp: , Rfl:    carvedilol  (COREG ) 12.5 MG tablet, Take 1 tablet (12.5 mg total) by mouth 2 (two) times daily., Disp: 180 tablet, Rfl: 3   Continuous Blood Gluc Sensor (DEXCOM G6 SENSOR) MISC, 1 Device by Other route as directed., Disp: , Rfl:    EPINEPHRINE  0.3 mg/0.3 mL IJ SOAJ injection, INJECT CONTENTS OF ONE PEN INTO THE MUSCLE AS NEEDED FOR ANAPHYLAXIS, Disp: 2 each, Rfl: 0   furosemide  (LASIX ) 40 MG tablet, Take 1 tablet (40 mg total) by mouth 2 (two) times daily., Disp: 180 tablet, Rfl: 3   hydrALAZINE  (APRESOLINE ) 25 MG tablet, Take 1 tablet (25 mg total) by mouth 3 (three) times daily., Disp: 270 tablet, Rfl: 3   Insulin  Human (INSULIN  PUMP) SOLN, Inject into the skin continuous. , Disp: , Rfl:    Insulin  Pen Needle (PEN NEEDLES 31GX5/16") 31G X 8 MM MISC, , Disp: , Rfl:    JARDIANCE 10 MG TABS tablet, Take 5 mg by mouth every Monday, Wednesday, and Friday., Disp: , Rfl:    KERENDIA 10 MG TABS, Take 10 mg by mouth every Monday,  Wednesday, and Friday., Disp: , Rfl:    Lactulose  20 GM/30ML SOLN, Take 15 mLs (10 g total) by mouth 2 (two) times daily., Disp: 900 mL, Rfl: 3   levothyroxine  (SYNTHROID ) 112 MCG tablet, Take 112 mcg by mouth daily before breakfast., Disp: , Rfl:    lubiprostone  (AMITIZA ) 24 MCG capsule, Take 1 capsule (24 mcg total) by mouth 2 (two) times daily with a meal., Disp: 60 capsule, Rfl: 11   metFORMIN  (GLUCOPHAGE ) 500 MG tablet, Take 1 tablet (500 mg total) by mouth 2 (two) times daily with a meal., Disp: , Rfl:    nitroGLYCERIN  (NITROSTAT ) 0.4 MG SL tablet, Place 1 tablet (0.4 mg total) under the tongue every 5 (five) minutes as needed for chest pain., Disp: 25 tablet, Rfl: 3   NOVOLOG  100 UNIT/ML injection, Inject into the skin continuous. In insulin  pump, Disp: , Rfl: 0   pantoprazole  (PROTONIX ) 40 MG tablet, TAKE 1 TABLET BY MOUTH TWICE DAILY BEFORE A MEAL, Disp: 60 tablet, Rfl: 0   verapamil  (CALAN ) 80 MG tablet, Take 1 tablet (80 mg total) by mouth 3 (three) times daily., Disp: 90 tablet, Rfl: 5  Past Medical History: Past Medical History:  Diagnosis Date   Adult RDS (HCC)    Anemia    Brain aneurysm    frontal lobe   CAD (coronary artery disease)  a. 02/2018: s/p DES to Proximal LAD with residual 20% RCA stenosis.   Chronic pain    Diabetes mellitus without complication (HCC)    Headache    History of left bundle branch block (LBBB)    HTN (hypertension)    Hx of cardiovascular stress test 10/2016   intermediate risk study   Hypothyroidism    Neuromuscular disorder (HCC)    Neuropathy    Obesity    OSA on CPAP 03/10/2015   Renal insufficiency    Retinopathy    Sleep apnea    Varicose veins of both lower extremities     Tobacco Use: Social History   Tobacco Use  Smoking Status Never  Smokeless Tobacco Never    Labs: Review Flowsheet  More data exists      Latest Ref Rng & Units 07/23/2015 03/20/2018 04/16/2018 11/15/2019 06/28/2022  Labs for ITP Cardiac and  Pulmonary Rehab  Cholestrol 0 - 200 mg/dL - - 562  - -  LDL (calc) 0 - 99 mg/dL - - 54  - -  HDL-C >13 mg/dL - - 75  - -  Trlycerides <150 mg/dL - - 23  - -  Hemoglobin A1c 4.8 - 5.6 % 9.1  7.8  - 7.8  6.3   Bicarbonate 20.0 - 28.0 mmol/L - - - 13.2  -  Acid-base deficit 0.0 - 2.0 mmol/L - - - 12.4  -  O2 Saturation % - - - 31.8  -    Capillary Blood Glucose: Lab Results  Component Value Date   GLUCAP 223 (H) 11/23/2023   GLUCAP 75 10/11/2023   GLUCAP 22 (LL) 10/11/2023   GLUCAP 77 09/19/2023   GLUCAP 93 09/19/2023     Exercise Target Goals: Exercise Program Goal: Individual exercise prescription set using results from initial 6 min walk test and THRR while considering  patient's activity barriers and safety.   Exercise Prescription Goal: Starting with aerobic activity 30 plus minutes a day, 3 days per week for initial exercise prescription. Provide home exercise prescription and guidelines that participant acknowledges understanding prior to discharge.  Activity Barriers & Risk Stratification:  Activity Barriers & Cardiac Risk Stratification - 09/05/23 1307       Activity Barriers & Cardiac Risk Stratification   Activity Barriers History of Falls;Balance Concerns;Chest Pain/Angina;Assistive Device;Back Problems;Deconditioning;Shortness of Breath    Cardiac Risk Stratification High             6 Minute Walk:  6 Minute Walk     Row Name 09/10/23 0943 11/26/23 0808       6 Minute Walk   Phase Initial Discharge    Distance 810 feet 890 feet    Distance % Change -- 3.7 %    Distance Feet Change -- 80 ft    Walk Time 6 minutes 6 minutes    # of Rest Breaks 1 0    MPH 1.53 1.69    METS 1.53 1.87    RPE 12 12    Perceived Dyspnea  1 1    VO2 Peak 5.37 6.56    Symptoms Yes (comment) Yes (comment)    Comments one standing 30 second break SOB, felt like working harder    Resting HR 72 bpm 57 bpm    Resting BP 120/60 132/70    Resting Oxygen  Saturation  96 % --     Exercise Oxygen  Saturation  during 6 min walk 97 % --    Max Ex. HR 2.44 bpm 109 bpm  Max Ex. BP 156/64 146/62    2 Minute Post BP 140/60 --             Oxygen  Initial Assessment:   Oxygen  Re-Evaluation:   Oxygen  Discharge (Final Oxygen  Re-Evaluation):   Initial Exercise Prescription:  Initial Exercise Prescription - 09/10/23 0900       Date of Initial Exercise RX and Referring Provider   Date 09/10/23    Referring Provider Armida Lander MD      NuStep   Level 2    SPM 50    Minutes 15    METs 2      Recumbant Elliptical   Level 2    RPM 50    Minutes 15    METs 3      Prescription Details   Frequency (times per week) 3    Duration Progress to 30 minutes of continuous aerobic without signs/symptoms of physical distress      Intensity   THRR 40-80% of Max Heartrate 106-139    Ratings of Perceived Exertion 11-13    Perceived Dyspnea 0-4      Resistance Training   Training Prescription Yes    Weight 3    Reps 10-15             Perform Capillary Blood Glucose checks as needed.  Exercise Prescription Changes:   Exercise Prescription Changes     Row Name 09/10/23 0900 09/19/23 1000 10/01/23 1200 10/05/23 0800 10/15/23 1500     Response to Exercise   Blood Pressure (Admit) 120/60 158/64 112/70 -- 110/70   Blood Pressure (Exercise) 156/64 178/80 140/70 -- --   Blood Pressure (Exit) 140/60 132/68 120/70 -- 112/56   Heart Rate (Admit) 72 bpm 90 bpm 93 bpm -- 90 bpm   Heart Rate (Exercise) 94 bpm 119 bpm 96 bpm -- 107 bpm   Heart Rate (Exit) 76 bpm 99 bpm 78 bpm -- 83 bpm   Oxygen  Saturation (Admit) 96 % -- -- -- --   Oxygen  Saturation (Exercise) 97 % -- -- -- --   Oxygen  Saturation (Exit) 97 % -- -- -- --   Rating of Perceived Exertion (Exercise) 12 11 12  -- 11   Perceived Dyspnea (Exercise) 1 -- -- -- --   Duration Progress to 30 minutes of  aerobic without signs/symptoms of physical distress Continue with 30 min of aerobic exercise  without signs/symptoms of physical distress. Continue with 30 min of aerobic exercise without signs/symptoms of physical distress. -- Continue with 30 min of aerobic exercise without signs/symptoms of physical distress.   Intensity -- THRR unchanged THRR unchanged -- THRR unchanged     Progression   Progression -- Continue to progress workloads to maintain intensity without signs/symptoms of physical distress. Continue to progress workloads to maintain intensity without signs/symptoms of physical distress. -- Continue to progress workloads to maintain intensity without signs/symptoms of physical distress.     Resistance Training   Training Prescription -- Yes Yes -- Yes   Weight -- 5 5 -- 5   Reps -- 10-15 10-15 -- 10-15     NuStep   Level -- 4 5 -- 5   SPM -- 56 117 -- 138   Minutes -- 15 15 -- 15   METs -- 2.1 2.4 -- 2.3     Recumbant Elliptical   Level -- 4 4 -- 5   RPM -- 122 48 -- 47   Minutes -- 15 15 -- 15   METs -- 2.5  3.1 -- 4.3     Home Exercise Plan   Plans to continue exercise at -- -- -- Home (comment)  walking, treadmill, bike Home (comment)   Frequency -- -- -- Add 2 additional days to program exercise sessions. Add 2 additional days to program exercise sessions.   Initial Home Exercises Provided -- -- -- 10/05/23 --    Row Name 10/29/23 1300 11/16/23 1300           Response to Exercise   Blood Pressure (Admit) 126/72 136/70      Blood Pressure (Exit) 116/66 128/60      Heart Rate (Admit) 86 bpm 81 bpm      Heart Rate (Exercise) 112 bpm 101 bpm      Heart Rate (Exit) 86 bpm 87 bpm      Rating of Perceived Exertion (Exercise) 12 15      Duration Continue with 30 min of aerobic exercise without signs/symptoms of physical distress. Continue with 30 min of aerobic exercise without signs/symptoms of physical distress.      Intensity THRR unchanged THRR unchanged        Progression   Progression Continue to progress workloads to maintain intensity without  signs/symptoms of physical distress. Continue to progress workloads to maintain intensity without signs/symptoms of physical distress.        Resistance Training   Training Prescription Yes Yes      Weight 5 5      Reps 10-15 10-15        NuStep   Level 5 5      SPM 185 129      Minutes 15 15      METs 2.5 2.5        Recumbant Elliptical   Level 3 3      RPM 51 50      Minutes 15 15      METs 3.5 3.4        Home Exercise Plan   Plans to continue exercise at Home (comment) Home (comment)      Frequency Add 2 additional days to program exercise sessions. Add 2 additional days to program exercise sessions.               Exercise Comments:   Exercise Comments     Row Name 10/10/23 0803           Exercise Comments Jodi Ray is sympotmatic with frequent PVCs with SOB.  Dr. Amanda Jungling increased her coreg  that she will pick up today.                Exercise Goals and Review:   Exercise Goals     Row Name 09/10/23 0948             Exercise Goals   Increase Physical Activity Yes       Intervention Provide advice, education, support and counseling about physical activity/exercise needs.;Develop an individualized exercise prescription for aerobic and resistive training based on initial evaluation findings, risk stratification, comorbidities and participant's personal goals.       Expected Outcomes Short Term: Attend rehab on a regular basis to increase amount of physical activity.;Long Term: Add in home exercise to make exercise part of routine and to increase amount of physical activity.;Long Term: Exercising regularly at least 3-5 days a week.       Increase Strength and Stamina Yes       Intervention Provide advice, education, support and counseling about physical activity/exercise needs.;Develop an  individualized exercise prescription for aerobic and resistive training based on initial evaluation findings, risk stratification, comorbidities and participant's personal  goals.       Expected Outcomes Short Term: Increase workloads from initial exercise prescription for resistance, speed, and METs.;Short Term: Perform resistance training exercises routinely during rehab and add in resistance training at home;Long Term: Improve cardiorespiratory fitness, muscular endurance and strength as measured by increased METs and functional capacity ( )       Able to understand and use rate of perceived exertion (RPE) scale Yes       Intervention Provide education and explanation on how to use RPE scale       Expected Outcomes Short Term: Able to use RPE daily in rehab to express subjective intensity level;Long Term:  Able to use RPE to guide intensity level when exercising independently       Able to understand and use Dyspnea scale Yes       Intervention Provide education and explanation on how to use Dyspnea scale       Expected Outcomes Short Term: Able to use Dyspnea scale daily in rehab to express subjective sense of shortness of breath during exertion;Long Term: Able to use Dyspnea scale to guide intensity level when exercising independently       Knowledge and understanding of Target Heart Rate Range (THRR) Yes       Intervention Provide education and explanation of THRR including how the numbers were predicted and where they are located for reference       Expected Outcomes Short Term: Able to state/look up THRR;Long Term: Able to use THRR to govern intensity when exercising independently;Short Term: Able to use daily as guideline for intensity in rehab       Able to check pulse independently Yes       Intervention Provide education and demonstration on how to check pulse in carotid and radial arteries.;Review the importance of being able to check your own pulse for safety during independent exercise       Expected Outcomes Long Term: Able to check pulse independently and accurately;Short Term: Able to explain why pulse checking is important during independent exercise        Understanding of Exercise Prescription Yes       Intervention Provide education, explanation, and written materials on patient's individual exercise prescription       Expected Outcomes Short Term: Able to explain program exercise prescription;Long Term: Able to explain home exercise prescription to exercise independently                Exercise Goals Re-Evaluation :  Exercise Goals Re-Evaluation     Row Name 09/19/23 1002 10/05/23 0813 10/29/23 1332         Exercise Goal Re-Evaluation   Exercise Goals Review Increase Physical Activity;Increase Strength and Stamina;Understanding of Exercise Prescription Increase Physical Activity;Increase Strength and Stamina;Able to understand and use Dyspnea scale;Able to understand and use rate of perceived exertion (RPE) scale;Knowledge and understanding of Target Heart Rate Range (THRR);Able to check pulse independently;Understanding of Exercise Prescription Increase Physical Activity;Increase Strength and Stamina;Understanding of Exercise Prescription     Comments Jodi Ray is doijg well in rehab and tolerating exercise well. She has recently noticed her angina the past couple days when exercisng and is going to ask her cardiologist about taking her Nitro. Will continue to monitor and update as able. Jodi Ray is doing well in rehab. She is working on pushing through her angina and we have a plan she  pushes to 6/10 and then backs off or rest to recover then go again. Reviewed home exercise with pt today.  Pt plans to walk with dog at home for exercise.  She also has a treadmill and stationary bike at home as well.  Reviewed THR, pulse, RPE, sign and symptoms, pulse oximetery and when to call 911 or MD.  Also discussed weather considerations and indoor options.  Pt voiced understanding. Jodi Ray is doing well in rehab and on her 23rd visit. She increases her workloads or spm every week. She enjoys coming to rehab for teh socail aspect as well and is encourging to  other patients. She is exercising at a level 5 on the nustep with 185 spm. Will continue to montior and progress as able     Expected Outcomes continue to attend rehab SHort; Start to add in more at home Long; Continue to push anginal threshold continue to attend rehab               Discharge Exercise Prescription (Final Exercise Prescription Changes):  Exercise Prescription Changes - 11/16/23 1300       Response to Exercise   Blood Pressure (Admit) 136/70    Blood Pressure (Exit) 128/60    Heart Rate (Admit) 81 bpm    Heart Rate (Exercise) 101 bpm    Heart Rate (Exit) 87 bpm    Rating of Perceived Exertion (Exercise) 15    Duration Continue with 30 min of aerobic exercise without signs/symptoms of physical distress.    Intensity THRR unchanged      Progression   Progression Continue to progress workloads to maintain intensity without signs/symptoms of physical distress.      Resistance Training   Training Prescription Yes    Weight 5    Reps 10-15      NuStep   Level 5    SPM 129    Minutes 15    METs 2.5      Recumbant Elliptical   Level 3    RPM 50    Minutes 15    METs 3.4      Home Exercise Plan   Plans to continue exercise at Home (comment)    Frequency Add 2 additional days to program exercise sessions.             Nutrition:  Target Goals: Understanding of nutrition guidelines, daily intake of sodium 1500mg , cholesterol 200mg , calories 30% from fat and 7% or less from saturated fats, daily to have 5 or more servings of fruits and vegetables.  Biometrics:  Pre Biometrics - 09/10/23 0949       Pre Biometrics   Height 5' 6.5" (1.689 m)    Weight 123.8 kg    Waist Circumference 46 inches    Hip Circumference 54 inches    Waist to Hip Ratio 0.85 %    BMI (Calculated) 43.4    Grip Strength 28 kg             Post Biometrics - 11/26/23 0809        Post  Biometrics   Height 5' 6.5" (1.689 m)    Weight 117.4 kg    Waist Circumference  41.5 inches    Hip Circumference 53 inches    Waist to Hip Ratio 0.78 %    BMI (Calculated) 41.17    Grip Strength 27 kg    Single Leg Stand 0 seconds             Nutrition Therapy  Plan and Nutrition Goals:   Nutrition Assessments:  MEDIFICTS Score Key: >=70 Need to make dietary changes  40-70 Heart Healthy Diet <= 40 Therapeutic Level Cholesterol Diet  Flowsheet Row CARDIAC REHAB PHASE II EXERCISE from 11/26/2023 in [facility] CARDIAC REHABILITATION  Picture Your Plate Total Score on Admission 7  Picture Your Plate Total Score on Discharge 86      Picture Your Plate Scores: <16 Unhealthy dietary pattern with much room for improvement. 41-50 Dietary pattern unlikely to meet recommendations for good health and room for improvement. 51-60 More healthful dietary pattern, with some room for improvement.  >60 Healthy dietary pattern, although there may be some specific behaviors that could be improved.    Nutrition Goals Re-Evaluation:  Nutrition Goals Re-Evaluation     Row Name 10/05/23 0823             Goals   Nutrition Goal Heart Healthy Diet       Comment Jodi Ray meets with a dietitian every month at her endocrinologist office.  She eats healthy and tries to stick to it.  She eats a good variety of fruits and vegetables.  She has been watching her kidneys too so we will limit her protein.  She is pleased with how well she is eating and watching her portions.       Expected Outcome Short: Conitnue to meet Long; Continue to eat healthy.                Nutrition Goals Discharge (Final Nutrition Goals Re-Evaluation):  Nutrition Goals Re-Evaluation - 10/05/23 0823       Goals   Nutrition Goal Heart Healthy Diet    Comment Jodi Ray meets with a dietitian every month at her endocrinologist office.  She eats healthy and tries to stick to it.  She eats a good variety of fruits and vegetables.  She has been watching her kidneys too so we will limit her protein.  She is  pleased with how well she is eating and watching her portions.    Expected Outcome Short: Conitnue to meet Long; Continue to eat healthy.             Psychosocial: Target Goals: Acknowledge presence or absence of significant depression and/or stress, maximize coping skills, provide positive support system. Participant is able to verbalize types and ability to use techniques and skills needed for reducing stress and depression.  Initial Review & Psychosocial Screening:  Initial Psych Review & Screening - 09/05/23 1326       Initial Review   Current issues with None Identified      Family Dynamics   Good Support System? Yes      Barriers   Psychosocial barriers to participate in program There are no identifiable barriers or psychosocial needs.      Screening Interventions   Interventions Encouraged to exercise;To provide support and resources with identified psychosocial needs;Provide feedback about the scores to participant    Expected Outcomes Short Term goal: Identification and review with participant of any Quality of Life or Depression concerns found by scoring the questionnaire.;Long Term goal: The participant improves quality of Life and PHQ9 Scores as seen by post scores and/or verbalization of changes;Long Term Goal: Stressors or current issues are controlled or eliminated.;Short Term goal: Utilizing psychosocial counselor, staff and physician to assist with identification of specific Stressors or current issues interfering with healing process. Setting desired goal for each stressor or current issue identified.  Quality of Life Scores:  Quality of Life - 11/26/23 0831       Quality of Life   Select Quality of Life      Quality of Life Scores   Health/Function Pre 24.32 %    Health/Function Post 26.4 %    Health/Function % Change 8.55 %    Socioeconomic Pre 25.75 %    Socioeconomic Post 30 %    Socioeconomic % Change  16.5 %    Psych/Spiritual Pre  28.43 %    Psych/Spiritual Post 30 %    Psych/Spiritual % Change 5.52 %    Family Pre 19.5 %    Family Post 27 %    Family % Change 38.46 %    GLOBAL Pre 24.9 %    GLOBAL Post 28 %    GLOBAL % Change 12.45 %            Scores of 19 and below usually indicate a poorer quality of life in these areas.  A difference of  2-3 points is a clinically meaningful difference.  A difference of 2-3 points in the total score of the Quality of Life Index has been associated with significant improvement in overall quality of life, self-image, physical symptoms, and general health in studies assessing change in quality of life.  PHQ-9: Review Flowsheet  More data exists      11/26/2023 09/10/2023 09/26/2018 06/17/2018 04/09/2017  Depression screen PHQ 2/9  Decreased Interest 0 0 0 0 0  Down, Depressed, Hopeless 0 0 0 0 0  PHQ - 2 Score 0 0 0 0 0  Altered sleeping 0 0 3 0 -  Tired, decreased energy 0 1 0 0 -  Change in appetite 1 0 0 0 -  Feeling bad or failure about yourself  0 0 0 0 -  Trouble concentrating 0 0 0 0 -  Moving slowly or fidgety/restless - 0 0 0 -  Suicidal thoughts 0 0 0 0 -  PHQ-9 Score 1 1 3  0 -  Difficult doing work/chores Not difficult at all Not difficult at all Not difficult at all Not difficult at all -   Interpretation of Total Score  Total Score Depression Severity:  1-4 = Minimal depression, 5-9 = Mild depression, 10-14 = Moderate depression, 15-19 = Moderately severe depression, 20-27 = Severe depression   Psychosocial Evaluation and Intervention:  Psychosocial Evaluation - 09/05/23 1326       Psychosocial Evaluation & Interventions   Interventions Stress management education;Relaxation education;Encouraged to exercise with the program and follow exercise prescription    Comments Patient was referred to [facility] with stable angina. She has participated in our program pre Covid and joined our maintenance program. She was exercising at the [facility] but has had a lot of  stressors in her life and she stopped exercising and then started having angina. She did have a heart cath and medical management was the treatment recommendation. She did not share what her stressors were but did say she was seeing a counselor routinely and he had her on an antidepressant but this has been discontinued and she thinks her last visit with him will be the last one. She says the stressful situations have improved. She denies any depression or anxiety. She says she sleeps well at night. She says she has a great support system with her boyfriend and her church family. She is very excited to be participating in the program again. Her main goals for the program are to  lose weight; get back into an exercise routine and improve her angina symptoms. She has no barriers identified to participate in the program.    Expected Outcomes Short Term: Start the program and attend consistently. Long Term: Complete the program meeting her personal goals.    Continue Psychosocial Services  Follow up required by staff             Psychosocial Re-Evaluation:  Psychosocial Re-Evaluation     Row Name 10/05/23 0818             Psychosocial Re-Evaluation   Current issues with Current Stress Concerns       Comments Jodi Ray is doing well in rehab.  Her biggest stressor is her angina. She is working to push her thresholds. She has been going through several follow up MD appts and adjustments to her medications. She sleeps well at night, her dog will wake her up to play at night on occassion.       Expected Outcomes Short; Attend rehab to push anginal threshold Long; Continue to stay positive       Interventions Stress management education;Encouraged to attend Cardiac Rehabilitation for the exercise;Relaxation education                Psychosocial Discharge (Final Psychosocial Re-Evaluation):  Psychosocial Re-Evaluation - 10/05/23 0818       Psychosocial Re-Evaluation   Current issues with Current  Stress Concerns    Comments Jodi Ray is doing well in rehab.  Her biggest stressor is her angina. She is working to push her thresholds. She has been going through several follow up MD appts and adjustments to her medications. She sleeps well at night, her dog will wake her up to play at night on occassion.    Expected Outcomes Short; Attend rehab to push anginal threshold Long; Continue to stay positive    Interventions Stress management education;Encouraged to attend Cardiac Rehabilitation for the exercise;Relaxation education             Vocational Rehabilitation: Provide vocational rehab assistance to qualifying candidates.   Vocational Rehab Evaluation & Intervention:  Vocational Rehab - 09/05/23 1325       Initial Vocational Rehab Evaluation & Intervention   Assessment shows need for Vocational Rehabilitation No      Vocational Rehab Re-Evaulation   Comments Patient is disabled/retired.             Education: Education Goals: Education classes will be provided on a weekly basis, covering required topics. Participant will state understanding/return demonstration of topics presented.  Learning Barriers/Preferences:  Learning Barriers/Preferences - 09/05/23 1312       Learning Barriers/Preferences   Learning Barriers None    Learning Preferences Audio;Computer/Internet;Group Instruction;Individual Instruction;Pictoral;Skilled Demonstration;Written Material             Education Topics: Hypertension, Hypertension Reduction -Define heart disease and high blood pressure. Discus how high blood pressure affects the body and ways to reduce high blood pressure. Flowsheet Row CARDIAC REHAB PHASE II EXERCISE from 11/14/2023 in [place] C[facility]  Date 10/17/23  Educator [facility]  Instruction Review Code 1- Verbalizes Understanding       Exercise and Your Heart -Discuss why it is important to exercise, the FITT principles of exercise, normal and abnormal  responses to exercise, and how to exercise safely. Flowsheet Row CARDIAC REHAB PHASE II EXERCISE from 11/14/2023 in [place] [facility]  Date 11/14/23  Educator HB  Instruction Review Code 1- Verbalizes Understanding  Angina -Discuss definition of angina, causes of angina, treatment of angina, and how to decrease risk of having angina. Flowsheet Row CARDIAC REHAB PHASE II EXERCISE from 11/14/2023 in [place] CARDIAC REHABILIT[facility]  Date 11/07/23  Robynn Christian and Procedures]  Educator [facility]  Instruction Review Code 1- Verbalizes Understanding       Cardiac Medications -Review what the following cardiac medications are used for, how they affect the body, and side effects that may occur when taking the medications.  Medications include Aspirin , Beta blockers, calcium  channel blockers, ACE Inhibitors, angiotensin receptor blockers, diuretics, digoxin, and antihyperlipidemics. Flowsheet Row CARDIAC REHAB PHASE II EXERCISE from 11/14/2023 in [place] CARDIAC REHABIL[facility]  Date 10/24/23  Educator HB  Instruction Review Code 1- Verbalizes Understanding       Congestive Heart Failure -Discuss the definition of CHF, how to live with CHF, the signs and symptoms of CHF, and how keep track of weight and sodium intake. Flowsheet Row CARDIAC REHAB PHASE II EXERCISE from 11/14/2023 in [place] C[facility]HAB[facility]ITATION  Date 10/17/23  Educator [facility]  Instruction Review Code 1- Verbalizes Understanding       Heart Disease and Intimacy -Discus the effect sexual activity has on the heart, how changes occur during intimacy as we age, and safety during sexual activity. Flowsheet Row CARDIAC REHAB PHASE II EXERCISE from 11/14/2023 in [place] CARDIAC REHABILITATION  Date 09/19/23  Educator jh  Instruction Review Code 1- Verbalizes Understanding       Smoking Cessation / COPD -Discuss different methods to quit smoking, the health benefits of quitting smoking, and the definition  of COPD. Flowsheet Row CARDIAC REHAB PHASE II EXERCISE from 09/25/2018 in [place] CARDIAC REHAB[facility]  Date 07/17/18  Educator Katha Palau   Instruction Review Code 2- Demonstrated Understanding       Nutrition I: Fats -Discuss the types of cholesterol, what cholesterol does to the heart, and how cholesterol levels can be controlled.   Nutrition II: Labels -Discuss the different components of food labels and how to read food label Flowsheet Row CARDIAC REHAB PHASE II EXERCISE from 09/25/2018 in [place] [facility]  Date 08/07/18  Educator DC  Instruction Review Code 2- Demonstrated Understanding       Heart Parts/Heart Disease and PAD -Discuss the anatomy of the heart, the pathway of blood circulation through the heart, and these are affected by heart disease. Flowsheet Row CARDIAC REHAB PHASE II EXERCISE from 09/25/2018 in [place] CARDIAC REHABILITATION  Date 08/02/18  Educator Lincoln Renshaw  Instruction Review Code 2- Demonstrated Understanding       Stress I: Signs and Symptoms -Discuss the causes of stress, how stress may lead to anxiety and depression, and ways to limit stress. Flowsheet Row CARDIAC REHAB PHASE II EXERCISE from 11/14/2023 in [place] CARDIAC REHABILITATION  Date 10/10/23  Educator [facility]  Instruction Review Code 1- Verbalizes Understanding       Stress II: Relaxation -Discuss different types of relaxation techniques to limit stress.   Warning Signs of Stroke / TIA -Discuss definition of a stroke, what the signs and symptoms are of a stroke, and how to identify when someone is having stroke. Flowsheet Row CARDIAC REHAB PHASE II EXERCISE from 09/25/2018 in [place] CARDIAC REHABILITATION  Date 08/28/18  Educator DJ  Instruction Review Code 2- Demonstrated Understanding       Knowledge Questionnaire Score:  Knowledge Questionnaire Score - 11/26/23 0828       Knowledge Questionnaire Score   Pre Score 23/24  Post Score 23/24              Core Components/Risk Factors/Patient Goals at Admission:  Personal Goals and Risk Factors at Admission - 09/05/23 1325       Core Components/Risk Factors/Patient Goals on Admission    Weight Management Obesity    Improve shortness of breath with ADL's Yes    Intervention Provide education, individualized exercise plan and daily activity instruction to help decrease symptoms of SOB with activities of daily living.    Expected Outcomes Short Term: Improve cardiorespiratory fitness to achieve a reduction of symptoms when performing ADLs;Long Term: Be able to perform more ADLs without symptoms or delay the onset of symptoms    Diabetes Yes    Intervention Provide education about signs/symptoms and action to take for hypo/hyperglycemia.;Provide education about proper nutrition, including hydration, and aerobic/resistive exercise prescription along with prescribed medications to achieve blood glucose in normal ranges: Fasting glucose 65-99 mg/dL    Expected Outcomes Short Term: Participant verbalizes understanding of the signs/symptoms and immediate care of hyper/hypoglycemia, proper foot care and importance of medication, aerobic/resistive exercise and nutrition plan for blood glucose control.;Long Term: Attainment of HbA1C < 7%.    Heart Failure Yes    Intervention Provide a combined exercise and nutrition program that is supplemented with education, support and counseling about heart failure. Directed toward relieving symptoms such as shortness of breath, decreased exercise tolerance, and extremity edema.    Expected Outcomes Improve functional capacity of life;Short term: Attendance in program 2-3 days a week with increased exercise capacity. Reported lower sodium intake. Reported increased fruit and vegetable intake. Reports medication compliance.;Short term: Daily weights obtained and reported for increase. Utilizing diuretic protocols set by physician.;Long term: Adoption of  self-care skills and reduction of barriers for early signs and symptoms recognition and intervention leading to self-care maintenance.    Hypertension Yes    Intervention Provide education on lifestyle modifcations including regular physical activity/exercise, weight management, moderate sodium restriction and increased consumption of fresh fruit, vegetables, and low fat dairy, alcohol  moderation, and smoking cessation.;Monitor prescription use compliance.    Expected Outcomes Short Term: Continued assessment and intervention until BP is < 140/3mm HG in hypertensive participants. < 130/20mm HG in hypertensive participants with diabetes, heart failure or chronic kidney disease.;Long Term: Maintenance of blood pressure at goal levels.    Lipids Yes    Intervention Provide education and support for participant on nutrition & aerobic/resistive exercise along with prescribed medications to achieve LDL 70mg , HDL >40mg .    Expected Outcomes Short Term: Participant states understanding of desired cholesterol values and is compliant with medications prescribed. Participant is following exercise prescription and nutrition guidelines.;Long Term: Cholesterol controlled with medications as prescribed, with individualized exercise RX and with personalized nutrition plan. Value goals: LDL < 70mg , HDL > 40 mg.             Core Components/Risk Factors/Patient Goals Review:   Goals and Risk Factor Review     Row Name 10/05/23 0826             Core Components/Risk Factors/Patient Goals Review   Personal Goals Review Weight Management/Obesity;Diabetes;Heart Failure;Lipids;Hypertension       Review Jodi Ray is doing well in rehab.  Her weight is trending down.  She eats healthy and limits her portions.  She has not had any heart failure symptoms currently.  She is working on pushing her anginal thresholds.  Her sugars are doing well and she stops her pump during class.  Her blood pressures are doing well, she  called out Wednesday with a low BP.  She keeps a close eye on it and checks it frequently.       Expected Outcomes Short: Continue to work on weight loss long; Continue to keep eye on heart failure and angina                Core Components/Risk Factors/Patient Goals at Discharge (Final Review):   Goals and Risk Factor Review - 10/05/23 0826       Core Components/Risk Factors/Patient Goals Review   Personal Goals Review Weight Management/Obesity;Diabetes;Heart Failure;Lipids;Hypertension    Review Jodi Ray is doing well in rehab.  Her weight is trending down.  She eats healthy and limits her portions.  She has not had any heart failure symptoms currently.  She is working on pushing her anginal thresholds.  Her sugars are doing well and she stops her pump during class.  Her blood pressures are doing well, she called out Wednesday with a low BP.  She keeps a close eye on it and checks it frequently.    Expected Outcomes Short: Continue to work on weight loss long; Continue to keep eye on heart failure and angina             ITP Comments:  ITP Comments     Row Name 09/05/23 1337 09/10/23 0943 09/26/23 0800 10/24/23 0801 11/21/23 0817   ITP Comments Virtual visit completed 09/05/23. Her orientation visit is scheduled for Monday 2/10 at 8am. Patient arrived for 1st visit/orientation/education at 0800. Patient was referred to CR by Dr. Armida Lander due to Stable Angina. During orientation advised patient on arrival and appointment times what to wear, what to do before, during and after exercise. Reviewed attendance and class policy.  Pt is scheduled to return Cardiac Rehab on 09/12/23 at 745. Pt was advised to come to class 15 minutes before class starts.  Discussed RPE/Dpysnea scales. Patient participated in warm up stretches. Patient was able to complete 6 minute walk test.  She did report chest tightness 7/10 during walk test. It subsided to 2/10 after rest. She stated it was not bad enough to  take a NTG. Telemetry:SR. Patient was measured for the equipment. Discussed equipment safety with patient. Took patient pre-anthropometric 30 day review completed. ITP sent to Dr. Armida Lander, Medical Director of Cardiac Rehab. Continue with ITP unless changes are made by physician. Still newer to program.  Working on adjusting workloads with her anginal symptoms. 30 day review completed. ITP sent to Dr. Armida Lander, Medical Director of Cardiac Rehab. Continue with ITP unless changes are made by physician. 30 day review completed. ITP sent to Dr. Armida Lander, Medical Director of Cardiac Rehab. Continue with ITP unless changes are made by physician.  Jodi Ray was admitted to hospital for possible stroke and low blood sugar, awaiting clearance to return to rehab.    Row Name 11/22/23 1531 11/26/23 0759         ITP Comments Jodi Ray called to see if she could return to rehab.  Her endocrinologist cleared her and spoke to Dr. Amanda Jungling her cardiologist and he was okay with returning to rehab tomorrow. Aliceia graduated today from  rehab with 36 sessions completed.  Details of the patient's exercise prescription and what She needs to do in order to continue the prescription and progress were discussed with patient.  Patient was given a copy of prescription and goals.  Patient verbalized understanding. Tracey plans to continue to exercise  by joining gym/naintance.               Comments: Discharge ITP

## 2023-11-26 NOTE — Patient Instructions (Signed)
 Discharge Patient Instructions  Patient Details  Name: Jodi Ray MRN: [id number] Date of Birth: [birth year] Referring Provider:  Theoplis Fix, MD   Number of Visits: 5  Reason for Discharge:  Patient reached a stable level of exercise. Patient independent in their exercise. Patient has met program and personal goals.  Smoking History:  Social History   Tobacco Use  Smoking Status Never  Smokeless Tobacco Never    Diagnosis:  Stable angina (HCC)  Initial Exercise Prescription:  Initial Exercise Prescription - 09/10/23 0900       Date of Initial Exercise RX and Referring Provider   Date 09/10/23    Referring Provider Armida Lander MD      NuStep   Level 2    SPM 50    Minutes 15    METs 2      Recumbant Elliptical   Level 2    RPM 50    Minutes 15    METs 3      Prescription Details   Frequency (times per week) 3    Duration Progress to 30 minutes of continuous aerobic without signs/symptoms of physical distress      Intensity   THRR 40-80% of Max Heartrate 106-139    Ratings of Perceived Exertion 11-13    Perceived Dyspnea 0-4      Resistance Training   Training Prescription Yes    Weight 3    Reps 10-15             Discharge Exercise Prescription (Final Exercise Prescription Changes):  Exercise Prescription Changes - 11/16/23 1300       Response to Exercise   Blood Pressure (Admit) 136/70    Blood Pressure (Exit) 128/60    Heart Rate (Admit) 81 bpm    Heart Rate (Exercise) 101 bpm    Heart Rate (Exit) 87 bpm    Rating of Perceived Exertion (Exercise) 15    Duration Continue with 30 min of aerobic exercise without signs/symptoms of physical distress.    Intensity THRR unchanged      Progression   Progression Continue to progress workloads to maintain intensity without signs/symptoms of physical distress.      Resistance Training   Training Prescription Yes    Weight 5    Reps 10-15      NuStep   Level 5    SPM 129     Minutes 15    METs 2.5      Recumbant Elliptical   Level 3    RPM 50    Minutes 15    METs 3.4      Home Exercise Plan   Plans to continue exercise at Home (comment)    Frequency Add 2 additional days to program exercise sessions.             Functional Capacity:  6 Minute Walk     Row Name 09/10/23 0943 11/26/23 0808       6 Minute Walk   Phase Initial Discharge    Distance 810 feet 890 feet    Distance % Change -- 3.7 %    Distance Feet Change -- 80 ft    Walk Time 6 minutes 6 minutes    # of Rest Breaks 1 0    MPH 1.53 1.69    METS 1.53 1.87    RPE 12 12    Perceived Dyspnea  1 1    VO2 Peak 5.37 6.56    Symptoms Yes (comment) Yes (  comment)    Comments one standing 30 second break SOB, felt like working harder    Resting HR 72 bpm 57 bpm    Resting BP 120/60 132/70    Resting Oxygen  Saturation  96 % --    Exercise Oxygen  Saturation  during 6 min walk 97 % --    Max Ex. HR 2.44 bpm 109 bpm    Max Ex. BP 156/64 146/62    2 Minute Post BP 140/60 --            Nutrition & Weight - Outcomes:  Pre Biometrics - 09/10/23 0949       Pre Biometrics   Height 5' 6.5" (1.689 m)    Weight 123.8 kg    Waist Circumference 46 inches    Hip Circumference 54 inches    Waist to Hip Ratio 0.85 %    BMI (Calculated) 43.4    Grip Strength 28 kg             Post Biometrics - 11/26/23 0809        Post  Biometrics   Height 5' 6.5" (1.689 m)    Weight 117.4 kg    Waist Circumference 41.5 inches    Hip Circumference 53 inches    Waist to Hip Ratio 0.78 %    BMI (Calculated) 41.17    Grip Strength 27 kg    Single Leg Stand 0 seconds

## 2023-11-26 NOTE — Progress Notes (Signed)
 Daily Session Note  Patient Details  Name: Jodi Ray MRN: [id number] Date of Birth: [birth year] Referring Provider:   Flowsheet Row CARDIAC REHAB PHASE II ORIENTATION from 09/10/2023 in [facility]  Referring Provider Armida Lander MD       Encounter Date: 11/26/2023  Check In:  Session Check In - 11/26/23 0745       Check-In   Supervising physician immediately available to respond to emergencies See telemetry face sheet for immediately available MD    Location [facility]    Staff Present Clotilda Danish, BS, Exercise Physiologist;Jessica Zoila Hines, MA, RCEP, CCRP, CCET    Virtual Visit No    Medication changes reported     No    Fall or balance concerns reported    No    Tobacco Cessation No Change    Warm-up and Cool-down Performed on first and last piece of equipment    Resistance Training Performed Yes    VAD Patient? No    PAD/SET Patient? No      Pain Assessment   Currently in Pain? No/denies    Multiple Pain Sites No             Capillary Blood Glucose: No results found for this or any previous visit (from the past 24 hours).    Social History   Tobacco Use  Smoking Status Never  Smokeless Tobacco Never    Goals Met:  Independence with exercise equipment Exercise tolerated well No report of concerns or symptoms today Strength training completed today  Goals Unmet:  Not Applicable  Comments:  Mylisa graduated today from  rehab with 36 sessions completed.  Details of the patient's exercise prescription and what She needs to do in order to continue the prescription and progress were discussed with patient.  Patient was given a copy of prescription and goals.  Patient verbalized understanding. Chidimma plans to continue to exercise by joining gym/naintance.

## 2023-11-27 ENCOUNTER — Ambulatory Visit (HOSPITAL_COMMUNITY)
Admission: RE | Admit: 2023-11-27 | Discharge: 2023-11-27 | Disposition: A | Discharge status: Home / Self Care | Source: Ambulatory Visit | Attending: Cardiology | Admitting: Cardiology

## 2023-11-27 DIAGNOSIS — R0602 Shortness of breath: Secondary | ICD-10-CM | POA: Insufficient documentation

## 2023-11-27 LAB — ECHOCARDIOGRAM COMPLETE
AR max vel: 1.96 cm2
AV Area VTI: 1.66 cm2
AV Area mean vel: 1.85 cm2
AV Mean grad: 7 mmHg
AV Peak grad: 10.5 mmHg
Ao pk vel: 1.62 m/s
Area-P 1/2: 2.62 cm2
S' Lateral: 2.6 cm

## 2023-11-27 NOTE — Progress Notes (Signed)
*  PRELIMINARY RESULTS* Echocardiogram 2D Echocardiogram has been performed.  Bernis Brisker 11/27/2023, 10:39 AM

## 2023-11-28 ENCOUNTER — Ambulatory Visit (INDEPENDENT_AMBULATORY_CARE_PROVIDER_SITE_OTHER)

## 2023-11-28 ENCOUNTER — Encounter (HOSPITAL_COMMUNITY): Payer: Medicare Other

## 2023-11-28 DIAGNOSIS — J309 Allergic rhinitis, unspecified: Secondary | ICD-10-CM

## 2023-11-29 DIAGNOSIS — E1140 Type 2 diabetes mellitus with diabetic neuropathy, unspecified: Secondary | ICD-10-CM | POA: Diagnosis not present

## 2023-11-29 DIAGNOSIS — E1151 Type 2 diabetes mellitus with diabetic peripheral angiopathy without gangrene: Secondary | ICD-10-CM | POA: Diagnosis not present

## 2023-11-30 ENCOUNTER — Encounter (HOSPITAL_COMMUNITY): Payer: Medicare Other

## 2023-11-30 DIAGNOSIS — I10 Essential (primary) hypertension: Secondary | ICD-10-CM | POA: Diagnosis not present

## 2023-11-30 DIAGNOSIS — E109 Type 1 diabetes mellitus without complications: Secondary | ICD-10-CM | POA: Diagnosis not present

## 2023-11-30 DIAGNOSIS — Z9641 Presence of insulin pump (external) (internal): Secondary | ICD-10-CM | POA: Diagnosis not present

## 2023-12-03 ENCOUNTER — Encounter (HOSPITAL_COMMUNITY): Payer: Medicare Other

## 2023-12-03 DIAGNOSIS — Z299 Encounter for prophylactic measures, unspecified: Secondary | ICD-10-CM | POA: Diagnosis not present

## 2023-12-03 DIAGNOSIS — E109 Type 1 diabetes mellitus without complications: Secondary | ICD-10-CM | POA: Diagnosis not present

## 2023-12-03 DIAGNOSIS — I700 Atherosclerosis of aorta: Secondary | ICD-10-CM | POA: Diagnosis not present

## 2023-12-03 DIAGNOSIS — N1832 Chronic kidney disease, stage 3b: Secondary | ICD-10-CM | POA: Diagnosis not present

## 2023-12-03 DIAGNOSIS — I10 Essential (primary) hypertension: Secondary | ICD-10-CM | POA: Diagnosis not present

## 2023-12-05 ENCOUNTER — Ambulatory Visit (INDEPENDENT_AMBULATORY_CARE_PROVIDER_SITE_OTHER): Payer: Self-pay

## 2023-12-05 DIAGNOSIS — J309 Allergic rhinitis, unspecified: Secondary | ICD-10-CM

## 2023-12-10 NOTE — Progress Notes (Unsigned)
 Jodi Ray

## 2023-12-11 ENCOUNTER — Encounter: Payer: Self-pay | Admitting: Adult Health

## 2023-12-11 ENCOUNTER — Ambulatory Visit (INDEPENDENT_AMBULATORY_CARE_PROVIDER_SITE_OTHER): Payer: Medicare Other | Admitting: Adult Health

## 2023-12-11 VITALS — BP 129/64 | HR 71 | Ht 68.0 in | Wt 258.0 lb

## 2023-12-11 DIAGNOSIS — G4733 Obstructive sleep apnea (adult) (pediatric): Secondary | ICD-10-CM

## 2023-12-11 NOTE — Patient Instructions (Signed)
 Continue using CPAP nightly and greater than 4 hours each night If your symptoms worsen or you develop new symptoms please let us know.

## 2023-12-11 NOTE — Progress Notes (Signed)
 PATIENT: Jodi Ray DOB: 1961-02-15  REASON FOR VISIT: follow up HISTORY FROM: patient  Chief Complaint  Patient presents with   Obstructive Sleep Apnea    Rm 19 alone Pt is well and stable, reports no new concerns with OSA/CPAP.     HISTORY OF PRESENT ILLNESS:  Today 12/11/23:  Jodi Ray is a 63 y.o. female with a history of OSA on CPAP. Returns today for follow-up.  Reports that CPAP is working well.  She does state that she is not happy with her DME company.  She would like to switch to a different DME company.  States that she does not like to sleep without the CPAP.  Her download is below     Jodi Ray is a 63 y.o. female with a history of OSA on CPAP. Returns today for follow-up. Reports that CPAP is working well. Continues to find it beneficial. Can't sleep without it she states.  Download is below        HISTORY 12/07/21: Jodi Ray is a 63 year old female with a history of OSA on CPAP. Denies any new issues. She returns today for follow-up.     10/21/20: Jodi Ray is a 63 year old female with a history of obstructive sleep apnea on CPAP.  She returns today for follow-up.  We were unable to get a download today.  The patient has to take her machine to [facility] in order for them to pull the data.  Reports that she will do that within the next week.  Reports that she uses the CPAP nightly.  She denies any new issues.  Reports that she does need a new filter and tubing.  Returns today for an evaluation.  10/22/19: Jodi Ray is a 63 year old female with a history of obstructive sleep apnea on CPAP.  Her download indicates that she use her machine nightly for compliance of 100%.  She use her machine greater than 4 hours each night.  On average she uses her machine 8 hours and 17 minutes.  Her residual AHI is 0.5 on 11.6 cm of water with EPR 2.  She reports that her CPAP is working well for her.  She does use a cloth mask and reports that she has  a hard time getting this every 3 months from her DME company.  HISTORY (Copied from Dr.Dohmeier's note) 04-24-2019.  this patient returns a sleep study and is now on CPAP with good compliance.  She gets new supplies, a new sleep study was not ordered. The current settings are 11.6 cm water with 2 cm EPR, 100% compliance.  This is been better has used her machine every day of the last 30 days between 25 August on 23 April 2019 average usage time 7 hours and 43 minutes.  Her residual AHI is 0.6/h she does have moderate air leaks, she does have obstructive sleep apnea and rare central events.  Her Epworth Sleepiness Scale has been endorsed at only 2 points at her fatigue severity at 9 points her geriatric depression score was endorsed at 9 out of 15 points.  REVIEW OF SYSTEMS: Out of a complete 14 system review of symptoms, the patient complains only of the following symptoms, and all other reviewed systems are negative.  ESS 1   ALLERGIES: Allergies  Allergen Reactions   Other Nausea And Vomiting and Other (See Comments)    Opiates Alters mental status diarrhea   Fentanyl Nausea And Vomiting and Other (See Comments)  Nausea, vomiting, loss of consciousness, requiring reversal    HOME MEDICATIONS: Outpatient Medications Prior to Visit  Medication Sig Dispense Refill   ALPRAZolam (XANAX) 0.5 MG tablet Take 0.5 mg by mouth once a week.     aspirin  EC 81 MG tablet Take 1 tablet (81 mg total) by mouth daily. Swallow whole. 90 tablet 3   atorvastatin  (LIPITOR) 80 MG tablet TAKE 1 TABLET BY MOUTH ONCE DAILY AT  6PM 90 tablet 3   calcitRIOL (ROCALTROL) 0.25 MCG capsule Take 0.25 mcg by mouth every Monday, Wednesday, and Friday.     carvedilol  (COREG ) 12.5 MG tablet Take 1 tablet (12.5 mg total) by mouth 2 (two) times daily. 180 tablet 3   Continuous Blood Gluc Sensor (DEXCOM G6 SENSOR) MISC 1 Device by Other route as directed.     EPINEPHRINE  0.3 mg/0.3 mL IJ SOAJ injection INJECT CONTENTS  OF ONE PEN INTO THE MUSCLE AS NEEDED FOR ANAPHYLAXIS 2 each 0   furosemide  (LASIX ) 40 MG tablet Take 1 tablet (40 mg total) by mouth 2 (two) times daily. 180 tablet 3   Insulin  Human (INSULIN  PUMP) SOLN Inject into the skin continuous.      JARDIANCE 10 MG TABS tablet Take 5 mg by mouth every Monday, Wednesday, and Friday.     KERENDIA 10 MG TABS Take 10 mg by mouth every Monday, Wednesday, and Friday.     levothyroxine  (SYNTHROID ) 112 MCG tablet Take 112 mcg by mouth daily before breakfast.     lubiprostone  (AMITIZA ) 24 MCG capsule Take 1 capsule (24 mcg total) by mouth 2 (two) times daily with a meal. 60 capsule 11   metFORMIN  (GLUCOPHAGE ) 500 MG tablet Take 1 tablet (500 mg total) by mouth 2 (two) times daily with a meal.     nitroGLYCERIN  (NITROSTAT ) 0.4 MG SL tablet Place 1 tablet (0.4 mg total) under the tongue every 5 (five) minutes as needed for chest pain. 25 tablet 3   NOVOLOG  100 UNIT/ML injection Inject into the skin continuous. In insulin  pump  0   pantoprazole  (PROTONIX ) 40 MG tablet TAKE 1 TABLET BY MOUTH TWICE DAILY BEFORE A MEAL 60 tablet 0   verapamil  (CALAN ) 80 MG tablet Take 1 tablet (80 mg total) by mouth 3 (three) times daily. 90 tablet 5   hydrALAZINE  (APRESOLINE ) 25 MG tablet Take 1 tablet (25 mg total) by mouth 3 (three) times daily. 270 tablet 3   Insulin  Pen Needle (PEN NEEDLES 31GX5/16") 31G X 8 MM MISC  (Patient not taking: Reported on 12/11/2023)     No facility-administered medications prior to visit.    PAST MEDICAL HISTORY: Past Medical History:  Diagnosis Date   Adult RDS (HCC)    Anemia    Brain aneurysm    frontal lobe   CAD (coronary artery disease)    a. 02/2018: s/p DES to Proximal LAD with residual 20% RCA stenosis.   Chronic pain    Diabetes mellitus without complication (HCC)    Headache    History of left bundle branch block (LBBB)    HTN (hypertension)    Hx of cardiovascular stress test 10/2016   intermediate risk study   Hypothyroidism     Neuromuscular disorder (HCC)    Neuropathy    Obesity    OSA on CPAP 03/10/2015   Renal insufficiency    Retinopathy    Sleep apnea    Varicose veins of both lower extremities     PAST SURGICAL HISTORY: Past Surgical History:  Procedure  Laterality Date   BIOPSY  11/14/2016   Procedure: BIOPSY;  Surgeon: Alyce Jubilee, MD;  [facility];  Service: Endoscopy;;  colon gastric duodenal   BIOPSY  03/19/2023   Procedure: BIOPSY;  Surgeon: Vinetta Greening, DO;  [facility]: [facility];  Service: Endoscopy;;   BREAST EXCISIONAL BIOPSY Right    BREAST SURGERY Left 1994   Lumpectomy   COLONOSCOPY WITH PROPOFOL  N/A 11/14/2016   Dr. Nolene Baumgarten: Moderately redundant rectosigmoid colon. Random colon biopsies benign. Internal hemorrhoids. Surveillance colonoscopy in 5 years.   COLONOSCOPY WITH PROPOFOL  N/A 04/20/2022   Procedure: COLONOSCOPY WITH PROPOFOL ;  Surgeon: Vinetta Greening, DO;  [facility]: [facility];  Service: Endoscopy;  Laterality: N/A;  12:30am   CORONARY PRESSURE/FFR STUDY N/A 06/19/2023   Procedure: CORONARY PRESSURE/FFR STUDY;  Surgeon: Kyra Phy, MD;  [facility]: [facility];  Service: Cardiovascular;  Laterality: N/A;   CORONARY STENT INTERVENTION N/A 03/22/2018   Procedure: CORONARY STENT INTERVENTION;  Surgeon: Odie Benne, MD;  Location: [facility];  Service: Cardiovascular;  Laterality: N/A;   ESOPHAGOGASTRODUODENOSCOPY (EGD) WITH PROPOFOL  N/A 11/14/2016   Dr. Nolene Baumgarten: Esophagus normal. Moderate gastritis, few gastric polyps. Duodenal biopsies negative. Fundic gland polyp gastric polyp area no H pylori.   ESOPHAGOGASTRODUODENOSCOPY (EGD) WITH PROPOFOL  N/A 03/19/2023   Procedure: ESOPHAGOGASTRODUODENOSCOPY (EGD) WITH PROPOFOL ;  Surgeon: Vinetta Greening, DO;  [facility];  Service: Endoscopy;  Laterality: N/A;  10:15 am, asa 3   FOOT SURGERY     LEFT HEART CATH AND CORONARY ANGIOGRAPHY N/A 03/22/2018   Procedure: LEFT  HEART CATH AND CORONARY ANGIOGRAPHY;  Surgeon: Odie Benne, MD;  [facility];  Service: Cardiovascular;  Laterality: N/A;   LEFT HEART CATH AND CORONARY ANGIOGRAPHY N/A 06/19/2023   Procedure: LEFT HEART CATH AND CORONARY ANGIOGRAPHY;  Surgeon: Kyra Phy, MD;  [facility];  Service: Cardiovascular;  Laterality: N/A;   LEG SURGERY Right    POLYPECTOMY  04/20/2022   Procedure: POLYPECTOMY;  Surgeon: Vinetta Greening, DO;  [facility];  Service: Endoscopy;;   REPLACEMENT TOTAL KNEE Right    SPINAL CORD STIMULATOR IMPLANT      FAMILY HISTORY: Family History  Problem Relation Age of Onset   Cancer Mother 36       colon   Cancer Father        renal cell   Colon cancer Father    Breast cancer Sister    Cancer Sister        primary brain, and primary breast too   Breast cancer Sister    Cancer Sister        breast    Breast cancer Sister    Breast cancer Sister    Cancer Brother        kidney and liver   Sleep apnea Neg Hx     SOCIAL HISTORY: Social History   Socioeconomic History   Marital status: Widowed    Spouse name: Not on file   Number of children: 0   Years of education: Not on file   Highest education level: Bachelor's degree (e.g., BA, AB, BS)  Occupational History   Occupation: Retired  Tobacco Use   Smoking status: Never   Smokeless tobacco: Never  Vaping Use   Vaping status: Never Used  Substance and Sexual Activity   Alcohol  use: No    Alcohol /week: 0.0 standard drinks of alcohol    Drug  use: No   Sexual activity: Not Currently    Partners: Male  Other Topics Concern   Not on file  Social History Narrative   Drinks about 1 cup coffee day.   Cardiac rehab 3x week   One level home with significant other   Bachelors in Accounting      Right handed   No children   widow   Social Drivers of Corporate investment banker Strain: Low Risk  (11/20/2023)   Received from [facility]    Overall Financial Resource Strain (CARDIA)    Difficulty of Paying Living Expenses: Not hard at all  Food Insecurity: No Food Insecurity (11/20/2023)   Received from [facility]   Hunger Vital Sign    Worried About Running Out of Food in the Last Year: Never true    Ran Out of Food in the Last Year: Never true  Transportation Needs: No Transportation Needs (11/20/2023)   [facility] from [facility] - Transportation    Lack of Transportation (Medical): No    Lack of Transportation (Non-Medical): No  Physical Activity: Not on file  Stress: Not on file  Social Connections: Not on file  Intimate Partner Violence: Not on file      PHYSICAL EXAM  Vitals:   12/11/23 0806  BP: 129/64  Pulse: 71  Weight: 258 lb (117 kg)  Height: 5\' 8"  (1.727 m)   Body mass index is 39.23 kg/m.  Generalized: Well developed, in no acute distress  Chest: Lungs clear to auscultation bilaterally  Neurological examination  Mentation: Alert oriented to time, place, history taking. Follows all commands speech and language fluent Cranial nerve II-XII: Facial symmetry noted  DIAGNOSTIC DATA (LABS, IMAGING, TESTING) - I reviewed patient records, labs, notes, testing and imaging myself where available.  Lab Results  Component Value Date   WBC 5.6 11/06/2023   HGB 11.6 (L) 11/06/2023   HCT 37.2 11/06/2023   MCV 83.8 11/06/2023   PLT 246 11/06/2023      Component Value Date/Time   NA 141 11/06/2023 0854   K 4.3 11/06/2023 0854   CL 101 11/06/2023 0854   CO2 30 11/06/2023 0854   GLUCOSE 55 (L) 11/06/2023 0854   BUN 26 (H) 11/06/2023 0854   CREATININE 1.20 (H) 11/06/2023 0854   CREATININE 0.98 07/23/2015 0707   CALCIUM  9.3 11/06/2023 0854   CALCIUM  9.7 01/17/2023 0927   PROT 7.7 06/28/2022 0831   ALBUMIN 3.3 (L) 11/06/2023 0854   AST 21 06/28/2022 0831   ALT 18 06/28/2022 0831   ALKPHOS 101 06/28/2022 0831   BILITOT 0.4 06/28/2022 0831   GFRNONAA 50 (L) 11/06/2023 0854    GFRAA >60 02/04/2020 1216   Lab Results  Component Value Date   CHOL 134 04/16/2018   HDL 75 04/16/2018   LDLCALC 54 04/16/2018   TRIG 23 04/16/2018   CHOLHDL 1.8 04/16/2018   Lab Results  Component Value Date   HGBA1C 6.3 (H) 06/28/2022   Lab Results  Component Value Date   VITAMINB12 342 11/23/2021   Lab Results  Component Value Date   TSH 1.520 06/28/2022      ASSESSMENT AND PLAN 63 y.o. year old female  has a past medical history of Adult RDS (HCC), Anemia, Brain aneurysm, CAD (coronary artery disease), Chronic pain, Diabetes mellitus without complication (HCC), Headache, History of left bundle branch block (LBBB), HTN (hypertension), cardiovascular stress test (10/2016), Hypothyroidism, Neuromuscular disorder (HCC), Neuropathy, Obesity, OSA on  CPAP (03/10/2015), Renal insufficiency, Retinopathy, Sleep apnea, and Varicose veins of both lower extremities. here with:  OSA on CPAP  - CPAP compliance excellent - Good treatment of AHI  - Encourage patient to use CPAP nightly and > 4 hours each night - F/U in 1 year or sooner if needed   Clem Currier, MSN, NP-C 12/11/2023, 8:36 AM [facility] [address], [address] [place], [place] [address] [phone]

## 2023-12-17 ENCOUNTER — Telehealth: Payer: Self-pay | Admitting: *Deleted

## 2023-12-17 NOTE — Telephone Encounter (Signed)
 LVM for patient to call back to tell us  which DME she wants to switch to.

## 2023-12-17 NOTE — Progress Notes (Signed)
 [facility] - Clinic Note  12/31/2023     CHIEF COMPLAINT Patient presents for Retina Follow Up   HISTORY OF PRESENT ILLNESS: Jodi Ray is a 63 y.o. female who presents to the clinic today for:   HPI     Retina Follow Up   Patient presents with  Diabetic Retinopathy.  In both eyes.  This started 17 months ago.  Severity is moderate.  Duration of 8 weeks.  Since onset it is stable.  I, the attending physician,  performed the HPI with the patient and updated documentation appropriately.        Comments   Pt presents for 8 week retina f/u, PDR OU. Pt states vision has been doing great, a little fuzzy this morning because sugar is a little low. Pt denies flashes/floaters. Pt is using extra drops in her left eye because it is sore some times but they only help somewhat. A1c=6.1, 5 weeks ago BS= 92 this morning.      Last edited by Ronelle Coffee, MD on 12/31/2023 11:55 AM.     Pt states her vision was a little fuzzy this morning bc her blood sugar was low  Referring physician: Theoplis Fix, MD [address] [place],  [place] [address]  HISTORICAL INFORMATION:   Selected notes from the MEDICAL RECORD NUMBER Referred by Dr. Micael Adas for concern of DME LEE:  Ocular Hx- PMH-    CURRENT MEDICATIONS: No current outpatient medications on file. (Ophthalmic Drugs)   No current facility-administered medications for this visit. (Ophthalmic Drugs)   Current Outpatient Medications (Other)  Medication Sig   ALPRAZolam (XANAX) 0.5 MG tablet Take 0.5 mg by mouth once a week.   aspirin  EC 81 MG tablet Take 1 tablet (81 mg total) by mouth daily. Swallow whole.   atorvastatin  (LIPITOR) 80 MG tablet TAKE 1 TABLET BY MOUTH ONCE DAILY AT  6PM   calcitRIOL (ROCALTROL) 0.25 MCG capsule Take 0.25 mcg by mouth every Monday, Wednesday, and Friday.   carvedilol  (COREG ) 12.5 MG tablet Take 1 tablet (12.5 mg total) by mouth 2 (two) times daily.   Continuous Blood Gluc Sensor  (DEXCOM G6 SENSOR) MISC 1 Device by Other route as directed.   EPINEPHRINE  0.3 mg/0.3 mL IJ SOAJ injection INJECT CONTENTS OF ONE PEN INTO THE MUSCLE AS NEEDED FOR ANAPHYLAXIS   furosemide  (LASIX ) 40 MG tablet Take 1 tablet (40 mg total) by mouth 2 (two) times daily.   Insulin  Human (INSULIN  PUMP) SOLN Inject into the skin continuous.    JARDIANCE 10 MG TABS tablet Take 5 mg by mouth every Monday, Wednesday, and Friday.   KERENDIA 10 MG TABS Take 10 mg by mouth every Monday, Wednesday, and Friday.   levothyroxine  (SYNTHROID ) 112 MCG tablet Take 112 mcg by mouth daily before breakfast.   lubiprostone  (AMITIZA ) 24 MCG capsule Take 1 capsule (24 mcg total) by mouth 2 (two) times daily with a meal.   metFORMIN  (GLUCOPHAGE ) 500 MG tablet Take 1 tablet (500 mg total) by mouth 2 (two) times daily with a meal.   nitroGLYCERIN  (NITROSTAT ) 0.4 MG SL tablet Place 1 tablet (0.4 mg total) under the tongue every 5 (five) minutes as needed for chest pain.   NOVOLOG  100 UNIT/ML injection Inject into the skin continuous. In insulin  pump   pantoprazole  (PROTONIX ) 40 MG tablet TAKE 1 TABLET BY MOUTH TWICE DAILY BEFORE A MEAL   verapamil  (CALAN ) 80 MG tablet Take 1 tablet (80 mg total) by mouth 3 (three)  times daily.   hydrALAZINE  (APRESOLINE ) 25 MG tablet Take 1 tablet (25 mg total) by mouth 3 (three) times daily.   No current facility-administered medications for this visit. (Other)   REVIEW OF SYSTEMS: ROS   Positive for: Endocrine, Cardiovascular, Eyes Negative for: Constitutional, Gastrointestinal, Neurological, Skin, Genitourinary, Musculoskeletal, HENT, Respiratory, Psychiatric, Allergic/Imm, Heme/Lymph Last edited by Carrington Clack, COT on 12/31/2023  7:40 AM.        ALLERGIES Allergies  Allergen Reactions   Other Nausea And Vomiting and Other (See Comments)    Opiates Alters mental status diarrhea   Fentanyl Nausea And Vomiting and Other (See Comments)    Nausea, vomiting, loss of  consciousness, requiring reversal   PAST MEDICAL HISTORY Past Medical History:  Diagnosis Date   Adult RDS (HCC)    Anemia    Brain aneurysm    frontal lobe   CAD (coronary artery disease)    a. 02/2018: s/p DES to Proximal LAD with residual 20% RCA stenosis.   Chronic pain    Diabetes mellitus without complication (HCC)    Headache    History of left bundle branch block (LBBB)    HTN (hypertension)    Hx of cardiovascular stress test 10/2016   intermediate risk study   Hypothyroidism    Neuromuscular disorder (HCC)    Neuropathy    Obesity    OSA on CPAP 03/10/2015   Renal insufficiency    Retinopathy    Sleep apnea    Varicose veins of both lower extremities    Past Surgical History:  Procedure Laterality Date   BIOPSY  11/14/2016   Procedure: BIOPSY;  Surgeon: Alyce Jubilee, MD;  [facility]: [facility];  Service: Endoscopy;;  colon gastric duodenal   BIOPSY  03/19/2023   Procedure: BIOPSY;  Surgeon: Vinetta Greening, DO;  [facility]: [facility];  Service: Endoscopy;;   BREAST EXCISIONAL BIOPSY Right    BREAST SURGERY Left 1994   Lumpectomy   COLONOSCOPY WITH PROPOFOL  N/A 11/14/2016   Dr. Nolene Baumgarten: Moderately redundant rectosigmoid colon. Random colon biopsies benign. Internal hemorrhoids. Surveillance colonoscopy in 5 years.   COLONOSCOPY WITH PROPOFOL  N/A 04/20/2022   Procedure: COLONOSCOPY WITH PROPOFOL ;  Surgeon: Vinetta Greening, DO;  [facility]: [facility];  Service: Endoscopy;  Laterality: N/A;  12:30am   CORONARY PRESSURE/FFR STUDY N/A 06/19/2023   Procedure: CORONARY PRESSURE/FFR STUDY;  Surgeon: Kyra Phy, MD;  Location: [facility];  Service: Cardiovascular;  Laterality: N/A;   CORONARY STENT INTERVENTION N/A 03/22/2018   Procedure: CORONARY STENT INTERVENTION;  Surgeon: Odie Benne, MD;  Location: [facility];  Service: Cardiovascular;  Laterality: N/A;   ESOPHAGOGASTRODUODENOSCOPY (EGD) WITH PROPOFOL  N/A 11/14/2016    Dr. Nolene Baumgarten: Esophagus normal. Moderate gastritis, few gastric polyps. Duodenal biopsies negative. Fundic gland polyp gastric polyp area no H pylori.   ESOPHAGOGASTRODUODENOSCOPY (EGD) WITH PROPOFOL  N/A 03/19/2023   Procedure: ESOPHAGOGASTRODUODENOSCOPY (EGD) WITH PROPOFOL ;  Surgeon: Vinetta Greening, DO;  [facility];  Service: Endoscopy;  Laterality: N/A;  10:15 am, asa 3   FOOT SURGERY     LEFT HEART CATH AND CORONARY ANGIOGRAPHY N/A 03/22/2018   Procedure: LEFT HEART CATH AND CORONARY ANGIOGRAPHY;  Surgeon: Odie Benne, MD;  Location: [facility];  Service: Cardiovascular;  Laterality: N/A;   LEFT HEART CATH AND CORONARY ANGIOGRAPHY N/A 06/19/2023   Procedure: LEFT HEART CATH AND CORONARY ANGIOGRAPHY;  Surgeon: Kyra Phy, MD;  Location: [facility] INVASIVE CV  LAB;  Service: Cardiovascular;  Laterality: N/A;   LEG SURGERY Right    POLYPECTOMY  04/20/2022   Procedure: POLYPECTOMY;  Surgeon: Vinetta Greening, DO;  [facility]: [facility];  Service: Endoscopy;;   REPLACEMENT TOTAL KNEE Right    SPINAL CORD STIMULATOR IMPLANT     FAMILY HISTORY Family History  Problem Relation Age of Onset   Cancer Mother 64       colon   Cancer Father        renal cell   Colon cancer Father    Breast cancer Sister    Cancer Sister        primary brain, and primary breast too   Breast cancer Sister    Cancer Sister        breast    Breast cancer Sister    Breast cancer Sister    Cancer Brother        kidney and liver   Sleep apnea Neg Hx    SOCIAL HISTORY Social History   Tobacco Use   Smoking status: Never   Smokeless tobacco: Never  Vaping Use   Vaping status: Never Used  Substance Use Topics   Alcohol  use: No    Alcohol /week: 0.0 standard drinks of alcohol    Drug use: No       OPHTHALMIC EXAM:  Base Eye Exam     Visual Acuity (Snellen - Linear)       Right Left   Dist cc 20/25 -2 20/20 -1   Dist ph cc 20/25 NI    Correction: Glasses          Tonometry (Tonopen, 7:51 AM)       Right Left   Pressure 17 17         Pupils       Pupils Dark Light Shape React APD   Right PERRL 3 2 Round Brisk None   Left PERRL 3 2 Round Brisk None         Visual Fields       Left Right    Full Full         Extraocular Movement       Right Left    Full, Ortho Full, Ortho         Neuro/Psych     Oriented x3: Yes   Mood/Affect: Normal         Dilation     Both eyes: 1.0% Mydriacyl, 2.5% Phenylephrine  @ 7:52 AM           Slit Lamp and Fundus Exam     Slit Lamp Exam       Right Left   Lids/Lashes Dermatochalasis - upper lid, mild MGD Dermatochalasis - upper lid, mild MGD   Conjunctiva/Sclera nasal pingeucula nasal pingeucula, Trace Injection   Cornea arcus, mild tear film debris arcus, trace tear film debris   Anterior Chamber deep and clear, no cell or flare deep and clear, no cell or flare   Iris Round and dilated, No NVI Round and dilated, No NVI   Lens 2-3+ Nuclear sclerosis, 2-3+ Cortical cataract, 1+ Posterior subcapsular cataract 2-3+ Nuclear sclerosis, 2-3+ Cortical cataract, 1+ Posterior subcapsular cataract   Anterior Vitreous Vitreous syneresis Vitreous syneresis         Fundus Exam       Right Left   Disc Pink and Sharp, no NVD, temporal PPA Pink and Sharp, no NVD, temporal PPP   C/D Ratio 0.4 0.4   Macula Good foveal  reflex, +cystic change -- persistent, scattered MA -- improved Flat, good foveal reflex, scattered MA -- improved, trace cystic changes -- improved   Vessels attenuated, Tortuous attenuated, Tortuous, +NV -- regressing   Periphery Attached, dense 360 PRP extending posteriorly to arcades Attached, 360 PRP with good posterior fill in, room for fill in if needed           Refraction     Wearing Rx       Sphere Cylinder Axis Add   Right +1.75 +1.50 124 +2.50   Left +4.25 +2.25 001 +2.50           IMAGING AND PROCEDURES  Imaging and Procedures for  12/31/2023  OCT, Retina - OU - Both Eyes        Right Eye Quality was good. Central Foveal Thickness: 322. Progression has been stable. Findings include normal foveal contour, no IRF, no SRF (Trace persistent cystic changes centrally and inferior macula).   Left Eye Quality was good. Central Foveal Thickness: 295. Progression has improved. Findings include normal foveal contour, no SRF, epiretinal membrane, intraretinal fluid, macular pucker (interval improvement in cystic changes perifovea -- resolved; mild ERM and pucker nasal macula).   Notes  *Images captured and stored on drive  Diagnosis / Impression:  +DME OU OD: Trace persistent cystic changes centrally and inferior macula OS: interval improvement in cystic changes perifovea -- resolved; mild ERM and pucker nasal macula  Clinical management:  See below  Abbreviations: NFP - Normal foveal profile. CME - cystoid macular edema. PED - pigment epithelial detachment. IRF - intraretinal fluid. SRF - subretinal fluid. EZ - ellipsoid zone. ERM - epiretinal membrane. ORA - outer retinal atrophy. ORT - outer retinal tubulation. SRHM - subretinal hyper-reflective material. IRHM - intraretinal hyper-reflective material      Intravitreal Injection, Pharmacologic Agent - OD - Right Eye       Time Out 12/31/2023. 8:28 AM. Confirmed correct patient, procedure, site, and patient consented.   Anesthesia Topical anesthesia was used. Anesthetic medications included Lidocaine  2%, Proparacaine 0.5%.   Procedure Preparation included 5% betadine to ocular surface, eyelid speculum. A (32g) needle was used.   Injection: 2 mg aflibercept  2 MG/0.05ML   Route: Intravitreal, Site: Right Eye   NDC: [id number], Lot: [id number], Expiration date: 02/27/2025, Waste: 0 mL   Post-op Post injection exam found visual acuity of at least counting fingers. The patient tolerated the procedure well. There were no complications. The patient received written  and verbal post procedure care education.             ASSESSMENT/PLAN:    ICD-10-CM   1. Proliferative diabetic retinopathy of both eyes with macular edema associated with type 2 diabetes mellitus (HCC)  E11.3513 OCT, Retina - OU - Both Eyes    Intravitreal Injection, Pharmacologic Agent - OD - Right Eye    aflibercept  (EYLEA ) SOLN 2 mg    2. Long term (current) use of oral hypoglycemic drugs  Z79.84     3. Long-term (current) use of injectable non-insulin  antidiabetic drugs  Z79.85     4. Current use of insulin  (HCC)  Z79.4     5. Essential hypertension  I10     6. Hypertensive retinopathy of both eyes  H35.033     7. Combined forms of age-related cataract of both eyes  H25.813       1-4. Proliferative diabetic retinopathy OU - previously managed by Dr. Ambrosio Junker -- lost to f/u since 2020 - s/p PRP OU and  IVA OS x7 (BB) - s/p IVA OD #1 (01.02.24), #2 (02.02.24), #3 (03.01.24), #4(04.05.24), #5 (10.21.24) -- IVA resistance - s/p IVA OS #1 (01.04.24), #2 (02.02.24), #3 (03.01.24), #4(04.05.24), #5 (05.03.24), #6 (06.03.24), #7 (07.01.24), #8 (08.05.24), #9 (09.09.24) - IVA resistance ======================================================== - s/p IVE OD #1 (05.03.24), #2 (06.03.24), #3 (07.01.24), #4 (08.05.24), #5 (09.09.24), #6 (12.10.24), #7 (01.13.25), #8 (02.24.25), #9 (04.07.25) - s/p IVE OS #1 (10.21.24), #2 (12.10.24), #3 (04.07.25) - s/p PRP fill in OS (01.23.24) - exam shows scattered MA OU; laser PRP OU - FA (01.02.23) shows OD: Cluster of leakage temporal macula, no NV; OS: Scattered focal NVE greatest inferior and temporal quads -- will need PRP fill in OS -- completed Jan 2024 - BCVA 20/25 OD -- stable; 20/20 OS -- improved - OCT shows OD: Trace persistent cystic changes centrally and inferior macula; OS: interval improvement in cystic changes perifovea -- resolved; mild ERM and pucker nasal macula at 8 weeks - recommend IVE OD #9 today, 06.02.25 w/ f/u at 8 wks  again - treating OS PRN -- will hold tx in OS today -- pt in agreement - pt wishes to proceed with IVE OD - RBA of procedure discussed, questions answered - see procedure note - IVE informed consent obtained and signed, 05.03.24 (OU) - f/u 8 weeks -- DFE, OCT, possible injxns  5,6. Hypertensive retinopathy OU - discussed importance of tight BP control  - monitor  7. Mixed Cataract OU - The symptoms of cataract, surgical options, and treatments and risks were discussed with patient. - discussed diagnosis and progression - monitor  Ophthalmic Meds Ordered this visit:  Meds ordered this encounter  Medications   aflibercept  (EYLEA ) SOLN 2 mg     Return in about 8 weeks (around 02/25/2024) for f/u PDR OU, DFE, OCT, Possible Injxn.  There are no Patient Instructions on file for this visit.  This document serves as a record of services personally performed by Jeanice Millard, MD, PhD. It was created on their behalf by Mayola Specking, COA an ophthalmic technician. The creation of this record is the provider's dictation and/or activities during the visit.   Electronically signed by: Carrington Clack, COT  12/31/23  11:56 AM   This document serves as a record of services personally performed by Jeanice Millard, MD, PhD. It was created on their behalf by Morley Arabia. Bevin Bucks, OA an ophthalmic technician. The creation of this record is the provider's dictation and/or activities during the visit.    Electronically signed by: Morley Arabia. Bevin Bucks, OA 12/31/23 11:56 AM  Jeanice Millard, M.D., Ph.D. Diseases & Surgery of the Retina and Vitreous [facility] 12/31/2023   I have reviewed the above documentation for accuracy and completeness, and I agree with the above. Jeanice Millard, M.D., Ph.D. 12/31/23 11:57 AM   Abbreviations: M myopia (nearsighted); A astigmatism; H hyperopia (farsighted); P presbyopia; Mrx spectacle prescription;  CTL contact lenses; OD right eye; OS left  eye; OU both eyes  XT exotropia; ET esotropia; PEK punctate epithelial keratitis; PEE punctate epithelial erosions; DES dry eye syndrome; MGD meibomian gland dysfunction; ATs artificial tears; PFAT's preservative free artificial tears; NSC nuclear sclerotic cataract; PSC posterior subcapsular cataract; ERM epi-retinal membrane; PVD posterior vitreous detachment; RD retinal detachment; DM diabetes mellitus; DR diabetic retinopathy; NPDR non-proliferative diabetic retinopathy; PDR proliferative diabetic retinopathy; CSME clinically significant macular edema; DME diabetic macular edema; dbh dot blot hemorrhages; CWS cotton wool spot; POAG primary open angle glaucoma;  C/D cup-to-disc ratio; HVF humphrey visual field; GVF goldmann visual field; OCT optical coherence tomography; IOP intraocular pressure; BRVO Branch retinal vein occlusion; CRVO central retinal vein occlusion; CRAO central retinal artery occlusion; BRAO branch retinal artery occlusion; RT retinal tear; SB scleral buckle; PPV pars plana vitrectomy; VH Vitreous hemorrhage; PRP panretinal laser photocoagulation; IVK intravitreal kenalog ; VMT vitreomacular traction; MH Macular hole;  NVD neovascularization of the disc; NVE neovascularization elsewhere; AREDS age related eye disease study; ARMD age related macular degeneration; POAG primary open angle glaucoma; EBMD epithelial/anterior basement membrane dystrophy; ACIOL anterior chamber intraocular lens; IOL intraocular lens; PCIOL posterior chamber intraocular lens; Phaco/IOL phacoemulsification with intraocular lens placement; PRK photorefractive keratectomy; LASIK laser assisted in situ keratomileusis; HTN hypertension; DM diabetes mellitus; COPD chronic obstructive pulmonary disease

## 2023-12-18 ENCOUNTER — Other Ambulatory Visit: Payer: Self-pay | Admitting: *Deleted

## 2023-12-18 DIAGNOSIS — G4733 Obstructive sleep apnea (adult) (pediatric): Secondary | ICD-10-CM

## 2023-12-18 NOTE — Telephone Encounter (Addendum)
 Pt said do not remember the name of the DME company discussed at last office visit. Would like a call back

## 2023-12-18 NOTE — Telephone Encounter (Signed)
 I called pt and could not LM.  She had [employer].  Not sure what DME discussed but could try [employer].

## 2023-12-19 ENCOUNTER — Ambulatory Visit (INDEPENDENT_AMBULATORY_CARE_PROVIDER_SITE_OTHER)

## 2023-12-19 DIAGNOSIS — J309 Allergic rhinitis, unspecified: Secondary | ICD-10-CM | POA: Diagnosis not present

## 2023-12-19 NOTE — Telephone Encounter (Signed)
 Spoke to patient chose [facility] has DME faxed over new orders this am

## 2023-12-20 ENCOUNTER — Other Ambulatory Visit (HOSPITAL_COMMUNITY)
Admission: RE | Admit: 2023-12-20 | Discharge: 2023-12-20 | Disposition: A | Discharge status: Home / Self Care | Source: Ambulatory Visit | Attending: Nephrology | Admitting: Nephrology

## 2023-12-20 DIAGNOSIS — E1122 Type 2 diabetes mellitus with diabetic chronic kidney disease: Secondary | ICD-10-CM | POA: Insufficient documentation

## 2023-12-20 DIAGNOSIS — I5032 Chronic diastolic (congestive) heart failure: Secondary | ICD-10-CM | POA: Diagnosis not present

## 2023-12-20 DIAGNOSIS — N189 Chronic kidney disease, unspecified: Secondary | ICD-10-CM | POA: Insufficient documentation

## 2023-12-20 DIAGNOSIS — R809 Proteinuria, unspecified: Secondary | ICD-10-CM | POA: Diagnosis not present

## 2023-12-20 DIAGNOSIS — D631 Anemia in chronic kidney disease: Secondary | ICD-10-CM | POA: Diagnosis not present

## 2023-12-20 DIAGNOSIS — N2581 Secondary hyperparathyroidism of renal origin: Secondary | ICD-10-CM | POA: Diagnosis not present

## 2023-12-20 LAB — RENAL FUNCTION PANEL
Albumin: 3.6 g/dL (ref 3.5–5.0)
Anion gap: 10 (ref 5–15)
BUN: 28 mg/dL — ABNORMAL HIGH (ref 8–23)
CO2: 29 mmol/L (ref 22–32)
Calcium: 9.2 mg/dL (ref 8.9–10.3)
Chloride: 100 mmol/L (ref 98–111)
Creatinine, Ser: 1.18 mg/dL — ABNORMAL HIGH (ref 0.44–1.00)
GFR, Estimated: 51 mL/min — ABNORMAL LOW (ref >60)
Glucose, Bld: 71 mg/dL (ref 70–99)
Phosphorus: 4 mg/dL (ref 2.5–4.6)
Potassium: 3.5 mmol/L (ref 3.5–5.1)
Sodium: 139 mmol/L (ref 135–145)

## 2023-12-20 LAB — CBC
HCT: 39.1 % (ref 36.0–46.0)
Hemoglobin: 12.3 g/dL (ref 12.0–15.0)
MCH: 26.7 pg (ref 26.0–34.0)
MCHC: 31.5 g/dL (ref 30.0–36.0)
MCV: 85 fL (ref 80.0–100.0)
Platelets: 249 10*3/uL (ref 150–400)
RBC: 4.6 MIL/uL (ref 3.87–5.11)
RDW: 18.1 % — ABNORMAL HIGH (ref 11.5–15.5)
WBC: 5 10*3/uL (ref 4.0–10.5)
nRBC: 0 % (ref 0.0–0.2)

## 2023-12-21 LAB — MICROALBUMIN / CREATININE URINE RATIO
Creatinine, Urine: 118.9 mg/dL
Microalb Creat Ratio: 11 mg/g{creat} (ref 0–29)
Microalb, Ur: 13 ug/mL — ABNORMAL HIGH

## 2023-12-21 LAB — PTH, INTACT AND CALCIUM
Calcium, Total (PTH): 9.4 mg/dL (ref 8.7–10.3)
PTH: 76 pg/mL — ABNORMAL HIGH (ref 15–65)

## 2023-12-29 DIAGNOSIS — R809 Proteinuria, unspecified: Secondary | ICD-10-CM | POA: Diagnosis not present

## 2023-12-29 DIAGNOSIS — N1831 Chronic kidney disease, stage 3a: Secondary | ICD-10-CM | POA: Diagnosis not present

## 2023-12-29 DIAGNOSIS — N170 Acute kidney failure with tubular necrosis: Secondary | ICD-10-CM | POA: Diagnosis not present

## 2023-12-29 DIAGNOSIS — E1129 Type 2 diabetes mellitus with other diabetic kidney complication: Secondary | ICD-10-CM | POA: Diagnosis not present

## 2023-12-31 ENCOUNTER — Encounter: Payer: Self-pay | Admitting: Adult Health

## 2023-12-31 ENCOUNTER — Ambulatory Visit (INDEPENDENT_AMBULATORY_CARE_PROVIDER_SITE_OTHER): Admitting: Ophthalmology

## 2023-12-31 ENCOUNTER — Encounter (INDEPENDENT_AMBULATORY_CARE_PROVIDER_SITE_OTHER): Payer: Self-pay | Admitting: Ophthalmology

## 2023-12-31 DIAGNOSIS — H25813 Combined forms of age-related cataract, bilateral: Secondary | ICD-10-CM | POA: Diagnosis not present

## 2023-12-31 DIAGNOSIS — Z7984 Long term (current) use of oral hypoglycemic drugs: Secondary | ICD-10-CM | POA: Diagnosis not present

## 2023-12-31 DIAGNOSIS — Z794 Long term (current) use of insulin: Secondary | ICD-10-CM

## 2023-12-31 DIAGNOSIS — H35033 Hypertensive retinopathy, bilateral: Secondary | ICD-10-CM

## 2023-12-31 DIAGNOSIS — Z7985 Long-term (current) use of injectable non-insulin antidiabetic drugs: Secondary | ICD-10-CM

## 2023-12-31 DIAGNOSIS — E113513 Type 2 diabetes mellitus with proliferative diabetic retinopathy with macular edema, bilateral: Secondary | ICD-10-CM

## 2023-12-31 DIAGNOSIS — I10 Essential (primary) hypertension: Secondary | ICD-10-CM | POA: Diagnosis not present

## 2023-12-31 MED ORDER — AFLIBERCEPT 2MG/0.05ML IZ SOLN FOR KALEIDOSCOPE
2.0000 mg | INTRAVITREAL | Status: AC | PRN
  Administered 2023-12-31: 2 mg via INTRAVITREAL

## 2023-12-31 NOTE — Telephone Encounter (Signed)
 I spoke with Mitch at [employer] after their office hours. He reviewed pt's file and confirmed [employer] did call [phone] but they documented that pt's phone # was out of service and they tried to call our office today to confirm phone # but LVM. I confirmed with him that the number on file for the patient is correct according to our records. I will confirm with patient and let Mitch know.

## 2023-12-31 NOTE — Telephone Encounter (Signed)
 Order was sent to [employer] on 12/18/23. I called [employer] and was told the patient's account has been setup, they have changed her pressure, and she is ready to be scheduled for a transfer of care appt. The receptionist said she would call the patient as soon as our call ended. She is aware the patient needs new supplies as well.

## 2024-01-01 NOTE — Telephone Encounter (Signed)
 I called Mitch @ [employer] and LVM confirming pt's phone number. I also called the [place] office [employer] and spoke with Marissa who had tried reach pt yesterday. She is going to call the pt again now and then call me back with an update. I informed her that patient may not answer due to not being home but to please LVM if able.

## 2024-01-01 NOTE — Telephone Encounter (Signed)
 Madi @ [employer] called me back. She said she got pt's VM. She emailed the patient at her request. She offered pt an appt for setup on Monday at 9 and 130 pm. She is waiting on pt to respond.

## 2024-01-02 ENCOUNTER — Telehealth: Payer: Self-pay

## 2024-01-02 ENCOUNTER — Ambulatory Visit (INDEPENDENT_AMBULATORY_CARE_PROVIDER_SITE_OTHER): Payer: Self-pay

## 2024-01-02 DIAGNOSIS — J309 Allergic rhinitis, unspecified: Secondary | ICD-10-CM

## 2024-01-02 MED ORDER — LEVOCETIRIZINE DIHYDROCHLORIDE 5 MG PO TABS: 5.0000 mg | ORAL_TABLET | Freq: Every day | ORAL | 1 refills | Status: DC | PRN

## 2024-01-02 NOTE — Telephone Encounter (Signed)
*  Asthma/Allergy  Pharmacy Patient Advocate Encounter   Received notification from CoverMyMeds that prior authorization for Levocetirizine Dihydrochloride 5MG  tablets  is required/requested.   Insurance verification completed.   The patient is insured through [facility] .   Per test claim: PA required; PA submitted to above mentioned insurance via CoverMyMeds Key/confirmation #/EOC [id number] Status is pending

## 2024-01-03 ENCOUNTER — Telehealth: Payer: Self-pay | Admitting: Gastroenterology

## 2024-01-03 ENCOUNTER — Ambulatory Visit: Payer: Self-pay | Admitting: Cardiology

## 2024-01-03 NOTE — Telephone Encounter (Signed)
 Inbound call from patient requesting to speak with [facility] regarding Enema instructions for upcoming procedure. States previously when she used those they did not work. Requesting a call to discuss other alternatives. Please advise, thank you.

## 2024-01-03 NOTE — Telephone Encounter (Signed)
 Patient concerned because "enemas do not work" and she has a bowel movement only every 7 days or so. She cannot produce a bowel movement with laxatives or enemas. She agrees she will use the enema follow ing the instructions. If she does not have any results, she will go to the test anyway.

## 2024-01-08 ENCOUNTER — Telehealth: Payer: Self-pay | Admitting: Adult Health

## 2024-01-08 ENCOUNTER — Encounter: Payer: Self-pay | Admitting: Student

## 2024-01-08 ENCOUNTER — Ambulatory Visit: Attending: Student | Admitting: Student

## 2024-01-08 VITALS — BP 122/58 | HR 57 | Ht 68.0 in | Wt 260.2 lb

## 2024-01-08 DIAGNOSIS — F330 Major depressive disorder, recurrent, mild: Secondary | ICD-10-CM | POA: Diagnosis not present

## 2024-01-08 DIAGNOSIS — N1831 Chronic kidney disease, stage 3a: Secondary | ICD-10-CM | POA: Diagnosis not present

## 2024-01-08 DIAGNOSIS — E782 Mixed hyperlipidemia: Secondary | ICD-10-CM | POA: Diagnosis not present

## 2024-01-08 DIAGNOSIS — I25118 Atherosclerotic heart disease of native coronary artery with other forms of angina pectoris: Secondary | ICD-10-CM | POA: Insufficient documentation

## 2024-01-08 DIAGNOSIS — I5032 Chronic diastolic (congestive) heart failure: Secondary | ICD-10-CM | POA: Diagnosis not present

## 2024-01-08 DIAGNOSIS — R002 Palpitations: Secondary | ICD-10-CM | POA: Diagnosis not present

## 2024-01-08 DIAGNOSIS — I10 Essential (primary) hypertension: Secondary | ICD-10-CM | POA: Diagnosis not present

## 2024-01-08 DIAGNOSIS — G4733 Obstructive sleep apnea (adult) (pediatric): Secondary | ICD-10-CM

## 2024-01-08 NOTE — Progress Notes (Signed)
 Cardiology Office Note    Date:  01/08/2024  ID:  Jodi Ray, DOB February 18, 1961, MRN [id number] Cardiologist: Armida Lander, MD    History of Present Illness:    Jodi Ray is a 63 y.o. female with past medical history of CAD (s/p DES placement to the proximal LAD in 02/2018, NST in 01/2022 showing no ischemia, cath in 06/2023 showing patent LAD stent with only mild 10% ISR and mild disease of the RCA and was noted to have evidence of coronary microvascular dysfunction with CFR less than 2), HTN, HLD, Type I DM, Stage III CKD, chronic HFpEF, OSA, palpitations (PAC's, PVC's and bigeminy by prior monitor) and chronic LBBB who presents to the office today for 37-month follow-up.  She was last examined by Dr. Amanda Jungling in 10/2023 and reported having intermittent episodes of chest pain which had overall been stable. Coreg  was increased to 12.5 mg twice daily given this along with episodes of palpitations. She had previously not tolerated Ranexa  1000 mg twice daily but did tolerate 500 mg twice daily in the past and was recommend to retry this once beta-blocker therapy had been maximized. She also reported a 6 pound weight gain and worsening lower extremity edema, therefore Lasix  was increased to 60 mg twice daily for 3 days then back to 40 mg twice daily. A follow-up echocardiogram was also recommended and this showed a preserved EF of 55 to 60% with grade 1 diastolic dysfunction, normal RV function and no significant valve abnormalities.  In talking with the patient today, she reports significant improvement in her palpitations since Coreg  was adjusted at the time of her last office visit. She has been participating in maintenance cardiac rehab and denies any progressive chest pain or dyspnea on exertion with this. No recent orthopnea or PND. Lower extremity edema has improved with taking Lasix  40 mg twice daily. She also wears compression stockings daily.   Studies Reviewed:   EKG: EKG is not  ordered today.   Cardiac Catheterization: 06/2023   Prox LAD lesion is 10% stenosed.   1.  Occluded right radial necessitating right ulnar approach. 2.  Patent proximal LAD stent with mild in-stent restenosis and mild disease of the right coronary artery. 3.  LVEDP of 18 mmHg. 4.  Evidence of coronary microvascular dysfunction with a CFR of less than 2.  The index of microvascular resistance is negative at 20.   Recommendation: Given evidence of coronary microvascular dysfunction would avoid nitrate containing compounds and consider the  ACE inhibitors, ARBs, statins, calcium  channel blockers, beta-blockers, ranolazine , and ivadrabine.  Echocardiogram: 10/2023 IMPRESSIONS     1. Left ventricular ejection fraction, by estimation, is 55 to 60%. The  left ventricle has normal function. The left ventricle has no regional  wall motion abnormalities. Left ventricular diastolic parameters are  consistent with Grade I diastolic  dysfunction (impaired relaxation).   2. Right ventricular systolic function is normal. The right ventricular  size is normal.   3. Left atrial size was mildly dilated.   4. The mitral valve is normal in structure. No evidence of mitral valve  regurgitation. No evidence of mitral stenosis. Moderate mitral annular  calcification.   5. The aortic valve is normal in structure. Aortic valve regurgitation is  not visualized. No aortic stenosis is present.   6. The inferior vena cava is normal in size with greater than 50%  respiratory variability, suggesting right atrial pressure of 3 mmHg.   Physical Exam:   VS:  BP Aaron Aas)  122/58 (BP Location: Left Arm, Cuff Size: Large)   Pulse (!) 57   Ht 5\' 8"  (1.727 m)   Wt 260 lb 3.2 oz (118 kg)   SpO2 93%   BMI 39.56 kg/m    Wt Readings from Last 3 Encounters:  01/08/24 260 lb 3.2 oz (118 kg)  12/11/23 258 lb (117 kg)  11/26/23 258 lb 14.4 oz (117.4 kg)     GEN: Well nourished, well developed female appearing in no acute  distress NECK: No JVD; No carotid bruits CARDIAC: RRR, no murmurs, rubs, gallops RESPIRATORY:  Clear to auscultation without rales, wheezing or rhonchi  ABDOMEN: Appears non-distended. No obvious abdominal masses. EXTREMITIES: No clubbing or cyanosis. No pitting edema.  Distal pedal pulses are 2+ bilaterally.   Assessment and Plan:   1. Coronary artery disease of native artery of native heart with stable angina pectoris [facility]) - She has a history of DES placement to the proximal-LAD in 2019 and recent cardiac catheterization in 06/2023 showed a patent proximal LAD stent but she did have evidence of coronary microvascular dysfunction. - She is participating in cardiac rehab and denies any recent anginal symptoms. Continue ASA 81 mg daily, Atorvastatin  80 mg daily and Coreg  12.5 mg twice daily.  2. Palpitations - Prior monitor showed PAC's and PVC's with 6 runs of SVT with the longest lasting for 10 beats.  - She reports symptoms have improved following dose adjustment of Coreg . Continue Coreg  12.5 mg twice daily for now. Would not further titrate given HR in the 50's.   3. Chronic heart failure with preserved ejection fraction (HFpEF) (HCC) - Echocardiogram in 10/2023 showed a preserved EF of 55 to 60% as outlined above.She appears euvolemic by examination today. - She remains on Jardiance 5 mg MWF (per Nephrology) and takes Lasix  40 mg twice daily. Creatinine had improved to 1.18 by recent labs.  4. Essential hypertension - BP is well-controlled at 122/58 during today's visit. Continue current medical therapy with Coreg  12.5 mg twice daily, Hydralazine  25 mg 3 times daily and Verapamil  80 mg 3 times daily (on this for cluster headaches as well).  5. Mixed hyperlipidemia - LDL was 61 when checked in 07/2023. Continue current medical therapy with Atorvastatin  80 mg daily.  6. Stage 3a chronic kidney disease (HCC) - Followed by Dr. Carrolyn Clan. Baseline creatinine 1.2 - 1.3. Improved to 1.18  when checked on 12/20/2023.  Signed, Dorma Gash, PA-C

## 2024-01-08 NOTE — Patient Instructions (Signed)

## 2024-01-08 NOTE — Telephone Encounter (Signed)
 Patient called, [employer] unable to get CPAP supplies because do not have the special mask that I wear. But could order it but would take 15 to 20 days [facility] suggested can  order supplies from another company, would have to pay for the supplies.  Would like a call back.

## 2024-01-09 NOTE — Telephone Encounter (Signed)
 Sent message to Mitch cain this evening about patient having issues ordering mask from [employer]

## 2024-01-16 ENCOUNTER — Ambulatory Visit (INDEPENDENT_AMBULATORY_CARE_PROVIDER_SITE_OTHER): Payer: Self-pay

## 2024-01-16 ENCOUNTER — Encounter (HOSPITAL_COMMUNITY)
Admission: RE | Disposition: A | Discharge status: Home / Self Care | Payer: Self-pay | Source: Home / Self Care | Attending: Gastroenterology

## 2024-01-16 ENCOUNTER — Ambulatory Visit (HOSPITAL_COMMUNITY)
Admission: RE | Admit: 2024-01-16 | Discharge: 2024-01-16 | Disposition: A | Discharge status: Home / Self Care | Payer: Medicare Other | Attending: Gastroenterology | Admitting: Gastroenterology

## 2024-01-16 ENCOUNTER — Encounter (HOSPITAL_COMMUNITY): Payer: Self-pay | Admitting: Gastroenterology

## 2024-01-16 DIAGNOSIS — K5909 Other constipation: Secondary | ICD-10-CM | POA: Diagnosis not present

## 2024-01-16 DIAGNOSIS — K5902 Outlet dysfunction constipation: Secondary | ICD-10-CM

## 2024-01-16 DIAGNOSIS — J309 Allergic rhinitis, unspecified: Secondary | ICD-10-CM

## 2024-01-16 DIAGNOSIS — K5900 Constipation, unspecified: Secondary | ICD-10-CM | POA: Diagnosis not present

## 2024-01-16 DIAGNOSIS — K6289 Other specified diseases of anus and rectum: Secondary | ICD-10-CM | POA: Diagnosis not present

## 2024-01-16 HISTORY — PX: ANAL RECTAL MANOMETRY: SHX6358

## 2024-01-16 SURGERY — MANOMETRY, ANORECTAL

## 2024-01-16 NOTE — Progress Notes (Signed)
 Anal rectal manometry performed per protocol without complications.  Patient tolerated well.  Balloon expulsion test performed per protocol without complications.  Patient tolerated well.  Expelled balloon after 1 minute and 30 seconds.

## 2024-01-18 ENCOUNTER — Encounter (HOSPITAL_COMMUNITY): Payer: Self-pay | Admitting: Gastroenterology

## 2024-01-26 ENCOUNTER — Other Ambulatory Visit: Payer: Self-pay | Admitting: Gastroenterology

## 2024-01-26 DIAGNOSIS — R110 Nausea: Secondary | ICD-10-CM

## 2024-01-30 ENCOUNTER — Ambulatory Visit (INDEPENDENT_AMBULATORY_CARE_PROVIDER_SITE_OTHER): Payer: Self-pay

## 2024-01-30 DIAGNOSIS — J309 Allergic rhinitis, unspecified: Secondary | ICD-10-CM | POA: Diagnosis not present

## 2024-02-05 NOTE — Telephone Encounter (Signed)
 Called [employer], was transferred to the sales center. The last note they have on file is that a mask called sunset sleep weaver kit (blue) was sent out on 6/11. I called Mitch with [employer] and I'm waiting on a call back.

## 2024-02-06 DIAGNOSIS — E1140 Type 2 diabetes mellitus with diabetic neuropathy, unspecified: Secondary | ICD-10-CM | POA: Diagnosis not present

## 2024-02-06 DIAGNOSIS — E1151 Type 2 diabetes mellitus with diabetic peripheral angiopathy without gangrene: Secondary | ICD-10-CM | POA: Diagnosis not present

## 2024-02-06 NOTE — Telephone Encounter (Signed)
 Faxed urgent order to [employer]. Received a receipt of confirmation. [phone]  Also sent urgent community message to [facility] team.

## 2024-02-06 NOTE — Telephone Encounter (Signed)
 Mitch w/ [employer] called me back. He is going to send an email to their resupply team to see if he can get anymore information. I told him the patient wants to be switched back to [employer].

## 2024-02-06 NOTE — Addendum Note (Signed)
 Addended by: HILLIARD HEATHER CROME on: 02/06/2024 08:55 AM   Modules accepted: Orders

## 2024-02-07 NOTE — Telephone Encounter (Signed)
 [place], [place]  [place], Heather CROME, RN; [place], Terri; [place], Jodi Ray is already a patient of [employer]. Patient called in yesterday and spoke with our CPAP technician and the mask issue is being handled. Thanks!

## 2024-02-13 ENCOUNTER — Ambulatory Visit (INDEPENDENT_AMBULATORY_CARE_PROVIDER_SITE_OTHER): Payer: Self-pay

## 2024-02-13 DIAGNOSIS — J309 Allergic rhinitis, unspecified: Secondary | ICD-10-CM

## 2024-02-14 DIAGNOSIS — N1832 Chronic kidney disease, stage 3b: Secondary | ICD-10-CM | POA: Diagnosis not present

## 2024-02-14 DIAGNOSIS — I10 Essential (primary) hypertension: Secondary | ICD-10-CM | POA: Diagnosis not present

## 2024-02-14 DIAGNOSIS — E109 Type 1 diabetes mellitus without complications: Secondary | ICD-10-CM | POA: Diagnosis not present

## 2024-02-14 DIAGNOSIS — E039 Hypothyroidism, unspecified: Secondary | ICD-10-CM | POA: Diagnosis not present

## 2024-02-14 DIAGNOSIS — Z9641 Presence of insulin pump (external) (internal): Secondary | ICD-10-CM | POA: Diagnosis not present

## 2024-02-20 ENCOUNTER — Ambulatory Visit (INDEPENDENT_AMBULATORY_CARE_PROVIDER_SITE_OTHER): Admitting: Internal Medicine

## 2024-02-20 ENCOUNTER — Encounter: Payer: Self-pay | Admitting: Internal Medicine

## 2024-02-20 ENCOUNTER — Other Ambulatory Visit: Payer: Self-pay | Admitting: *Deleted

## 2024-02-20 VITALS — BP 128/67 | HR 75 | Temp 98.2°F | Ht 68.0 in | Wt 264.5 lb

## 2024-02-20 DIAGNOSIS — K581 Irritable bowel syndrome with constipation: Secondary | ICD-10-CM

## 2024-02-20 DIAGNOSIS — R1032 Left lower quadrant pain: Secondary | ICD-10-CM | POA: Diagnosis not present

## 2024-02-20 DIAGNOSIS — R110 Nausea: Secondary | ICD-10-CM

## 2024-02-20 DIAGNOSIS — K5902 Outlet dysfunction constipation: Secondary | ICD-10-CM | POA: Diagnosis not present

## 2024-02-20 NOTE — Progress Notes (Signed)
 Referring Provider: Maree Isles, MD Primary Care Physician:  Maree Isles, MD Primary GI:  Dr. Cindie  Chief Complaint  Patient presents with   Follow-up    Pt arrives to follow up on Anorectal manometry test she had completed at [facility].     HPI:   Jodi Ray is a 63 y.o. female with a history of anemia, brain aneurysm, CAD s/p stent in 2019, diabetes, chronic pain, HTN, hypothyroidism, OSA on CPAP, and CKD, adenomatous colon polyps due for surveillance in 2028, hemorrhoids, constipation, chronic upper abdominal pain on daily PPI, presenting today for follow-up of constipation, abdominal pain, and nausea.   IBS with constipation: Previously trialed Linzess , Trulance, Motegrity  (too expensive), IBSrella, MiraLAX every morning, Metamucil twice daily. Taking Amitiza  24 mcg BID.   Continues to have bowel movements every 5 days or so.   Also with LLQ abdominal pain chronic, mild to moderate, intermittent.  Relieved with bowel movements.  Continues to have chronic nausea after meals.  History of type 1 diabetes for 50 years.  See further workup below.  Underwent anorectal manometry 01/16/2024 which showed normal internal and external anal sphincter pressures, evidence of dyssynergia defecation, rectal hyposensitivity with prolonged balloon expulsion time.  Recommended pelvic floor therapy for biofeedback.  Prior GI evaluation: HIDA scan 04/05/2023 unremarkable  Gastric imaging study 03/29/2023 without delayed gastric emptying.  EGD 03/19/2023 with gastritis, biopsies negative for H. Pylori.  CT abdomen pelvis with IV contrast 12/29/2022 unremarkable.  Colonoscopy  04/20/2022 with nonbleeding internal hemorrhoids, 3 mm cecal polyp removed, otherwise normal exam. Recommended 5-year surveillance due to family history of colon cancer. Pathology with tubular adenoma.   EGD April 2018 with moderate gastritis and benign gastric polyp. Also with normal duodenal biopsies.     Colonoscopy in 2018 with evidence of internal hemorrhoids, redundant left colon, and negative random colon biopsies for microscopic colitis.    HIDA with normal gallbladder EF in 2018, but patient reported pain after Ensure ingestion. Initially saw surgeon, Dr. Maranda, about possible cholecystectomy and scheduled surgery, but later cancelled as PCP did not think symptoms gallbladder related.     Abdominal ultrasound in 2018 with hepatic steatosis, mildly distended gallbladder, sonographic Murphy sign.   CT A/P with contrast in 2018 with fairly large amount of stool in the colon, small hiatal hernia, minimal ventral hernia containing fat only.  Past Medical History:  Diagnosis Date   Adult RDS (HCC)    Anemia    Brain aneurysm    frontal lobe   CAD (coronary artery disease)    a. 02/2018: s/p DES to Proximal LAD with residual 20% RCA stenosis.   Chronic pain    Diabetes mellitus without complication (HCC)    Headache    History of left bundle branch block (LBBB)    HTN (hypertension)    Hx of cardiovascular stress test 10/2016   intermediate risk study   Hypothyroidism    Neuromuscular disorder (HCC)    Neuropathy    Obesity    OSA on CPAP 03/10/2015   Renal insufficiency    Retinopathy    Sleep apnea    Varicose veins of both lower extremities     Past Surgical History:  Procedure Laterality Date   ANAL RECTAL MANOMETRY N/A 01/16/2024   Procedure: MANOMETRY, ANORECTAL;  Surgeon: Nandigam, Kavitha V, MD;  Location: [facility];  Service: Gastroenterology;  Laterality: N/A;   BIOPSY  11/14/2016   Procedure: BIOPSY;  Surgeon: Margo LITTIE Haddock, MD;  [facility];  Service: Endoscopy;;  colon gastric duodenal   BIOPSY  03/19/2023   Procedure: BIOPSY;  Surgeon: Cindie Carlin POUR, DO;  Location: [facility];  Service: Endoscopy;;   BREAST EXCISIONAL BIOPSY Right    BREAST SURGERY Left 1994   Lumpectomy   COLONOSCOPY WITH PROPOFOL  N/A 11/14/2016   Dr. harvey:  Moderately redundant rectosigmoid colon. Random colon biopsies benign. Internal hemorrhoids. Surveillance colonoscopy in 5 years.   COLONOSCOPY WITH PROPOFOL  N/A 04/20/2022   Procedure: COLONOSCOPY WITH PROPOFOL ;  Surgeon: Cindie Carlin POUR, DO;  [facility]: [facility];  Service: Endoscopy;  Laterality: N/A;  12:30am   CORONARY PRESSURE/FFR STUDY N/A 06/19/2023   Procedure: CORONARY PRESSURE/FFR STUDY;  Surgeon: Wendel Lurena POUR, MD;  Location: [facility];  Service: Cardiovascular;  Laterality: N/A;   CORONARY STENT INTERVENTION N/A 03/22/2018   Procedure: CORONARY STENT INTERVENTION;  Surgeon: Verlin Lonni BIRCH, MD;  Location: [facility];  Service: Cardiovascular;  Laterality: N/A;   ESOPHAGOGASTRODUODENOSCOPY (EGD) WITH PROPOFOL  N/A 11/14/2016   Dr. harvey: Esophagus normal. Moderate gastritis, few gastric polyps. Duodenal biopsies negative. Fundic gland polyp gastric polyp area no H pylori.   ESOPHAGOGASTRODUODENOSCOPY (EGD) WITH PROPOFOL  N/A 03/19/2023   Procedure: ESOPHAGOGASTRODUODENOSCOPY (EGD) WITH PROPOFOL ;  Surgeon: Cindie Carlin POUR, DO;  [facility];  Service: Endoscopy;  Laterality: N/A;  10:15 am, asa 3   FOOT SURGERY     LEFT HEART CATH AND CORONARY ANGIOGRAPHY N/A 03/22/2018   Procedure: LEFT HEART CATH AND CORONARY ANGIOGRAPHY;  Surgeon: Verlin Lonni BIRCH, MD;  [facility]: [facility];  Service: Cardiovascular;  Laterality: N/A;   LEFT HEART CATH AND CORONARY ANGIOGRAPHY N/A 06/19/2023   Procedure: LEFT HEART CATH AND CORONARY ANGIOGRAPHY;  Surgeon: Wendel Lurena POUR, MD;  Location: [facility];  Service: Cardiovascular;  Laterality: N/A;   LEG SURGERY Right    POLYPECTOMY  04/20/2022   Procedure: POLYPECTOMY;  Surgeon: Cindie Carlin POUR, DO;  [facility];  Service: Endoscopy;;   REPLACEMENT TOTAL KNEE Right    SPINAL CORD STIMULATOR IMPLANT      Current Outpatient Medications  Medication Sig Dispense Refill    ALPRAZolam (XANAX) 0.5 MG tablet Take 0.5 mg by mouth once a week.     aspirin  EC 81 MG tablet Take 1 tablet (81 mg total) by mouth daily. Swallow whole. 90 tablet 3   atorvastatin  (LIPITOR) 80 MG tablet TAKE 1 TABLET BY MOUTH ONCE DAILY AT  6PM 90 tablet 3   calcitRIOL (ROCALTROL) 0.25 MCG capsule Take 0.25 mcg by mouth every Monday, Wednesday, and Friday.     carvedilol  (COREG ) 12.5 MG tablet Take 1 tablet (12.5 mg total) by mouth 2 (two) times daily. 180 tablet 3   Continuous Blood Gluc Sensor (DEXCOM G6 SENSOR) MISC 1 Device by Other route as directed.     EPINEPHRINE  0.3 mg/0.3 mL IJ SOAJ injection INJECT CONTENTS OF ONE PEN INTO THE MUSCLE AS NEEDED FOR ANAPHYLAXIS 2 each 0   furosemide  (LASIX ) 40 MG tablet Take 1 tablet (40 mg total) by mouth 2 (two) times daily. 180 tablet 3   hydrALAZINE  (APRESOLINE ) 25 MG tablet Take 1 tablet (25 mg total) by mouth 3 (three) times daily. 270 tablet 3   Insulin  Human (INSULIN  PUMP) SOLN Inject into the skin continuous.      JARDIANCE 10 MG TABS tablet Take 5 mg by mouth every Monday, Wednesday, and Friday.     KERENDIA 10 MG TABS  Take 10 mg by mouth every Monday, Wednesday, and Friday.     levocetirizine (XYZAL ) 5 MG tablet Take 1 tablet (5 mg total) by mouth daily as needed for allergies (Can take an extra dose during flare ups.). 180 tablet 1   levothyroxine  (SYNTHROID ) 112 MCG tablet Take 112 mcg by mouth daily before breakfast.     lubiprostone  (AMITIZA ) 24 MCG capsule Take 1 capsule (24 mcg total) by mouth 2 (two) times daily with a meal. 60 capsule 11   metFORMIN  (GLUCOPHAGE ) 500 MG tablet Take 1 tablet (500 mg total) by mouth 2 (two) times daily with a meal.     nitroGLYCERIN  (NITROSTAT ) 0.4 MG SL tablet Place 1 tablet (0.4 mg total) under the tongue every 5 (five) minutes as needed for chest pain. 25 tablet 3   NOVOLOG  100 UNIT/ML injection Inject into the skin continuous. In insulin  pump  0   pantoprazole  (PROTONIX ) 40 MG tablet TAKE 1 TABLET BY  MOUTH TWICE DAILY BEFORE A MEAL 60 tablet 0   verapamil  (CALAN ) 80 MG tablet Take 1 tablet (80 mg total) by mouth 3 (three) times daily. 90 tablet 5   No current facility-administered medications for this visit.    Allergies as of 02/20/2024 - Review Complete 02/20/2024  Allergen Reaction Noted   Other Nausea And Vomiting and Other (See Comments) 08/04/2015   Fentanyl Nausea And Vomiting and Other (See Comments) 11/09/2016    Family History  Problem Relation Age of Onset   Cancer Mother 5       colon   Cancer Father        renal cell   Colon cancer Father    Breast cancer Sister    Cancer Sister        primary brain, and primary breast too   Breast cancer Sister    Cancer Sister        breast    Breast cancer Sister    Breast cancer Sister    Cancer Brother        kidney and liver   Sleep apnea Neg Hx     Social History   Socioeconomic History   Marital status: Widowed    Spouse name: Not on file   Number of children: 0   Years of education: Not on file   Highest education level: Bachelor's degree (e.g., BA, AB, BS)  Occupational History   Occupation: Retired  Tobacco Use   Smoking status: Never   Smokeless tobacco: Never  Vaping Use   Vaping status: Never Used  Substance and Sexual Activity   Alcohol  use: No    Alcohol /week: 0.0 standard drinks of alcohol    Drug use: No   Sexual activity: Not Currently    Partners: Male  Other Topics Concern   Not on file  Social History Narrative   Drinks about 1 cup coffee day.   Cardiac rehab 3x week   One level home with significant other   Bachelors in Accounting      Right handed   No children   widow   Social Drivers of Corporate investment banker Strain: Low Risk  (11/20/2023)   [facility]   Overall Financial Resource Strain (CARDIA)    Difficulty of Paying Living Expenses: Not hard at all  Food Insecurity: No Food Insecurity (11/20/2023)   [facility] from [facility]   Hunger Vital  Sign    Within the past 12 months, you worried that your food would  run out before you got the money to buy more.: Never true    Within the past 12 months, the food you bought just didn't last and you didn't have money to get more.: Never true  Transportation Needs: No Transportation Needs (11/20/2023)   Received from [facility] - Transportation    Lack of Transportation (Medical): No    Lack of Transportation (Non-Medical): No  Physical Activity: Not on file  Stress: Not on file  Social Connections: Not on file    Subjective: Review of Systems  Constitutional:  Negative for chills and fever.  HENT:  Negative for congestion and hearing loss.   Eyes:  Negative for blurred vision and double vision.  Respiratory:  Negative for cough and shortness of breath.   Cardiovascular:  Negative for chest pain and palpitations.  Gastrointestinal:  Positive for abdominal pain, constipation and nausea. Negative for blood in stool, diarrhea, heartburn, melena and vomiting.  Genitourinary:  Negative for dysuria and urgency.  Musculoskeletal:  Negative for joint pain and myalgias.  Skin:  Negative for itching and rash.  Neurological:  Negative for dizziness and headaches.  Psychiatric/Behavioral:  Negative for depression. The patient is not nervous/anxious.      Objective: BP 128/67   Pulse 75   Temp 98.2 F (36.8 C)   Ht 5' 8 (1.727 m)   Wt 264 lb 8 oz (120 kg)   BMI 40.22 kg/m  Physical Exam Constitutional:      Appearance: Normal appearance.  HENT:     Head: Normocephalic and atraumatic.  Eyes:     Extraocular Movements: Extraocular movements intact.     Conjunctiva/sclera: Conjunctivae normal.  Cardiovascular:     Rate and Rhythm: Normal rate and regular rhythm.  Pulmonary:     Effort: Pulmonary effort is normal.     Breath sounds: Normal breath sounds.  Abdominal:     General: Bowel sounds are normal.     Palpations: Abdomen is soft.  Musculoskeletal:         General: No swelling. Normal range of motion.     Cervical back: Normal range of motion and neck supple.  Skin:    General: Skin is warm and dry.     Coloration: Skin is not jaundiced.  Neurological:     General: No focal deficit present.     Mental Status: She is alert and oriented to person, place, and time.  Psychiatric:        Mood and Affect: Mood normal.        Behavior: Behavior normal.      Assessment/Plan:  1.  Irritable bowel syndrome with constipation, dyssynergic defecation-has trialed Linzess , Trulance, IBSrella, lactulose , suppositories, symptoms seem to be most improved on Amitiza  24 mcg twice daily.   Has taken MiraLAX on top of this as well as fiber therapy but still only averaging bowel movements every 5 days with difficult defecation and hard stools as well as associated abdominal pain.  She tells me today that she is frustrated and asking about possible surgical treatment.  Discussed that this is typically a last resort option and we need to ensure that this is truly just a slow transit constipation prior to even consider any.  Underwent anorectal manometry 01/16/2024 which showed normal internal and external anal sphincter pressures, evidence of dyssynergia defecation, rectal hyposensitivity with prolonged balloon expulsion time.  Will refer for pelvic floor therapy for biofeedback.  2.  Nausea-chronic, see HPI for full workup.  Given  her history of type 1 diabetes, she may have gastroparesis despite normal GES.  Consider trial of Reglan pending on clinical course.  3.  Left lower quadrant abdominal pain-CT abdomen pelvis reassuring.  Likely due to irritable bowel syndrome.  Hopefully this improves as we get her bowels moving more effectively.  Follow up in 2-3 months.    02/20/2024 10:43 AM   Disclaimer: This note was dictated with voice recognition software. Similar sounding words can inadvertently be transcribed and may not be corrected upon review.

## 2024-02-20 NOTE — Patient Instructions (Addendum)
 I am going to refer you for biofeedback therapy in [place] in regards to your pelvic dyssynergia.  Continue on Amitiza  for your chronic constipation.  You can take stool softeners on top of this as needed.  For your reference in the future, if your follow-up visit is on a Wednesday then I will be at [facility].  If your appointment is on Thursday I will be at [address].  Follow-up in 2 to 3 months.  It is always a pleasure seeing you.  Dr. Cindie

## 2024-02-25 NOTE — Progress Notes (Signed)
 [facility] - Clinic Note  02/26/2024     CHIEF COMPLAINT Patient presents for Retina Follow Up   HISTORY OF PRESENT ILLNESS: Jodi Ray is a 63 y.o. female who presents to the clinic today for:   HPI     Retina Follow Up   Patient presents with  Diabetic Retinopathy.  In both eyes.  This started 17 months ago.  Severity is moderate.  Duration of 8 weeks.  Since onset it is stable.  I, the attending physician,  performed the HPI with the patient and updated documentation appropriately.        Comments   Pt states left eye has been irritated, she thinks due to allergies. Pt states she is a little wonky due to low blood sugar this morning. Pt denies flashes/floaters. Pt is using thera tears in her left eye because it is sore but not helping much. Pt is going to get her allergy shot next week. A1c=7.1, 2 weeks ago BS= 87 this morning, seeing [facility] specialist in September because she is a fragile diabetic.      Last edited by Valdemar Rogue, MD on 02/27/2024  1:10 AM.    Pt states her left eye is bothering her, she thinks it's allergies, she has been using drops, but couldn't focus on the eye chart this morning, she is going to [facility] on September 17th to see someone about her diabetes, she states her A1c has gone up to 7 something  Referring physician: Maree Isles, MD [address] [place],  [place] [address]  HISTORICAL INFORMATION:   Selected notes from the MEDICAL RECORD NUMBER Referred by Dr. Vivian for concern of DME LEE:  Ocular Hx- PMH-    CURRENT MEDICATIONS: No current outpatient medications on file. (Ophthalmic Drugs)   No current facility-administered medications for this visit. (Ophthalmic Drugs)   Current Outpatient Medications (Other)  Medication Sig   ALPRAZolam (XANAX) 0.5 MG tablet Take 0.5 mg by mouth once a week.   aspirin  EC 81 MG tablet Take 1 tablet (81 mg total) by mouth daily. Swallow whole.   atorvastatin  (LIPITOR) 80 MG tablet  TAKE 1 TABLET BY MOUTH ONCE DAILY AT  6PM   calcitRIOL (ROCALTROL) 0.25 MCG capsule Take 0.25 mcg by mouth every Monday, Wednesday, and Friday.   carvedilol  (COREG ) 12.5 MG tablet Take 1 tablet (12.5 mg total) by mouth 2 (two) times daily.   Continuous Blood Gluc Sensor (DEXCOM G6 SENSOR) MISC 1 Device by Other route as directed.   EPINEPHRINE  0.3 mg/0.3 mL IJ SOAJ injection INJECT CONTENTS OF ONE PEN INTO THE MUSCLE AS NEEDED FOR ANAPHYLAXIS   furosemide  (LASIX ) 40 MG tablet Take 1 tablet (40 mg total) by mouth 2 (two) times daily.   hydrALAZINE  (APRESOLINE ) 25 MG tablet Take 1 tablet (25 mg total) by mouth 3 (three) times daily.   Insulin  Human (INSULIN  PUMP) SOLN Inject into the skin continuous.    JARDIANCE 10 MG TABS tablet Take 5 mg by mouth every Monday, Wednesday, and Friday.   KERENDIA 10 MG TABS Take 10 mg by mouth every Monday, Wednesday, and Friday.   levocetirizine (XYZAL ) 5 MG tablet Take 1 tablet (5 mg total) by mouth daily as needed for allergies (Can take an extra dose during flare ups.).   levothyroxine  (SYNTHROID ) 112 MCG tablet Take 112 mcg by mouth daily before breakfast.   lubiprostone  (AMITIZA ) 24 MCG capsule Take 1 capsule (24 mcg total) by mouth 2 (two) times daily with  a meal.   metFORMIN  (GLUCOPHAGE ) 500 MG tablet Take 1 tablet (500 mg total) by mouth 2 (two) times daily with a meal.   nitroGLYCERIN  (NITROSTAT ) 0.4 MG SL tablet Place 1 tablet (0.4 mg total) under the tongue every 5 (five) minutes as needed for chest pain.   NOVOLOG  100 UNIT/ML injection Inject into the skin continuous. In insulin  pump   pantoprazole  (PROTONIX ) 40 MG tablet TAKE 1 TABLET BY MOUTH TWICE DAILY BEFORE A MEAL   verapamil  (CALAN ) 80 MG tablet Take 1 tablet (80 mg total) by mouth 3 (three) times daily.   No current facility-administered medications for this visit. (Other)   REVIEW OF SYSTEMS: ROS   Positive for: Endocrine, Cardiovascular, Eyes Negative for: Constitutional,  Gastrointestinal, Neurological, Skin, Genitourinary, Musculoskeletal, HENT, Respiratory, Psychiatric, Allergic/Imm, Heme/Lymph Last edited by Elnor Avelina RAMAN, COT on 02/26/2024  7:44 AM.     ALLERGIES Allergies  Allergen Reactions   Other Nausea And Vomiting and Other (See Comments)    Opiates Alters mental status diarrhea   Fentanyl Nausea And Vomiting and Other (See Comments)    Nausea, vomiting, loss of consciousness, requiring reversal   PAST MEDICAL HISTORY Past Medical History:  Diagnosis Date   Adult RDS (HCC)    Anemia    Brain aneurysm    frontal lobe   CAD (coronary artery disease)    a. 02/2018: s/p DES to Proximal LAD with residual 20% RCA stenosis.   Chronic pain    Diabetes mellitus without complication (HCC)    Headache    History of left bundle branch block (LBBB)    HTN (hypertension)    Hx of cardiovascular stress test 10/2016   intermediate risk study   Hypothyroidism    Neuromuscular disorder (HCC)    Neuropathy    Obesity    OSA on CPAP 03/10/2015   Renal insufficiency    Retinopathy    Sleep apnea    Varicose veins of both lower extremities    Past Surgical History:  Procedure Laterality Date   ANAL RECTAL MANOMETRY N/A 01/16/2024   Procedure: MANOMETRY, ANORECTAL;  Surgeon: Nandigam, Kavitha V, MD;  Location: [facility];  Service: Gastroenterology;  Laterality: N/A;   BIOPSY  11/14/2016   Procedure: BIOPSY;  Surgeon: Margo LITTIE Haddock, MD;  Location: [facility];  Service: Endoscopy;;  colon gastric duodenal   BIOPSY  03/19/2023   Procedure: BIOPSY;  Surgeon: Cindie Carlin POUR, DO;  [facility]: [facility];  Service: Endoscopy;;   BREAST EXCISIONAL BIOPSY Right    BREAST SURGERY Left 1994   Lumpectomy   COLONOSCOPY WITH PROPOFOL  N/A 11/14/2016   Dr. haddock: Moderately redundant rectosigmoid colon. Random colon biopsies benign. Internal hemorrhoids. Surveillance colonoscopy in 5 years.   COLONOSCOPY WITH PROPOFOL  N/A 04/20/2022   Procedure:  COLONOSCOPY WITH PROPOFOL ;  Surgeon: Cindie Carlin POUR, DO;  [facility]: [facility];  Service: Endoscopy;  Laterality: N/A;  12:30am   CORONARY PRESSURE/FFR STUDY N/A 06/19/2023   Procedure: CORONARY PRESSURE/FFR STUDY;  Surgeon: Wendel Lurena POUR, MD;  Location: [facility];  Service: Cardiovascular;  Laterality: N/A;   CORONARY STENT INTERVENTION N/A 03/22/2018   Procedure: CORONARY STENT INTERVENTION;  Surgeon: Verlin Lonni BIRCH, MD;  Location: [facility];  Service: Cardiovascular;  Laterality: N/A;   ESOPHAGOGASTRODUODENOSCOPY (EGD) WITH PROPOFOL  N/A 11/14/2016   Dr. haddock: Esophagus normal. Moderate gastritis, few gastric polyps. Duodenal biopsies negative. Fundic gland polyp gastric polyp area no H pylori.   ESOPHAGOGASTRODUODENOSCOPY (EGD) WITH PROPOFOL   N/A 03/19/2023   Procedure: ESOPHAGOGASTRODUODENOSCOPY (EGD) WITH PROPOFOL ;  Surgeon: Cindie Carlin POUR, DO;  [facility];  Service: Endoscopy;  Laterality: N/A;  10:15 am, asa 3   FOOT SURGERY     LEFT HEART CATH AND CORONARY ANGIOGRAPHY N/A 03/22/2018   Procedure: LEFT HEART CATH AND CORONARY ANGIOGRAPHY;  Surgeon: Verlin Lonni BIRCH, MD;  [facility]: [facility];  Service: Cardiovascular;  Laterality: N/A;   LEFT HEART CATH AND CORONARY ANGIOGRAPHY N/A 06/19/2023   Procedure: LEFT HEART CATH AND CORONARY ANGIOGRAPHY;  Surgeon: Wendel Lurena POUR, MD;  [facility]: [facility];  Service: Cardiovascular;  Laterality: N/A;   LEG SURGERY Right    POLYPECTOMY  04/20/2022   Procedure: POLYPECTOMY;  Surgeon: Cindie Carlin POUR, DO;  [facility];  Service: Endoscopy;;   REPLACEMENT TOTAL KNEE Right    SPINAL CORD STIMULATOR IMPLANT     FAMILY HISTORY Family History  Problem Relation Age of Onset   Cancer Mother 69       colon   Cancer Father        renal cell   Colon cancer Father    Breast cancer Sister    Cancer Sister        primary brain, and primary breast too   Breast cancer  Sister    Cancer Sister        breast    Breast cancer Sister    Breast cancer Sister    Cancer Brother        kidney and liver   Sleep apnea Neg Hx    SOCIAL HISTORY Social History   Tobacco Use   Smoking status: Never   Smokeless tobacco: Never  Vaping Use   Vaping status: Never Used  Substance Use Topics   Alcohol  use: No    Alcohol /week: 0.0 standard drinks of alcohol    Drug use: No       OPHTHALMIC EXAM:  Base Eye Exam     Visual Acuity (Snellen - Linear)       Right Left   Dist [place] 20/25 -2 20/50 -2   Dist ph [place] 20/25 +2 20/30 +2         Tonometry (Tonopen, 7:53 AM)       Right Left   Pressure 13 16         Pupils       Pupils Dark Light Shape React APD   Right PERRL 3 2 Round Brisk None   Left PERRL 3 2 Round Brisk None         Visual Fields       Left Right    Full Full         Extraocular Movement       Right Left    Full, Ortho Full, Ortho         Neuro/Psych     Oriented x3: Yes   Mood/Affect: Normal         Dilation     Both eyes: 1.0% Mydriacyl, 2.5% Phenylephrine  @ 7:53 AM           Slit Lamp and Fundus Exam     Slit Lamp Exam       Right Left   Lids/Lashes Dermatochalasis - upper lid, mild MGD Dermatochalasis - upper lid, mild MGD   Conjunctiva/Sclera nasal pingeucula nasal pingeucula, Trace Injection   Cornea arcus, mild tear film debris arcus, 1+ fine Punctate epithelial erosions   Anterior Chamber deep and  clear, no cell or flare deep and clear, no cell or flare   Iris Round and dilated, No NVI Round and dilated, No NVI   Lens 2-3+ Nuclear sclerosis, 2-3+ Cortical cataract, 1+ Posterior subcapsular cataract 2-3+ Nuclear sclerosis, 2-3+ Cortical cataract, 1+ Posterior subcapsular cataract   Anterior Vitreous Vitreous syneresis Vitreous syneresis         Fundus Exam       Right Left   Disc Pink and Sharp, no NVD, temporal PPA Pink and Sharp, no NVD, temporal PPP   C/D Ratio 0.4 0.4   Macula Good  foveal reflex, +cystic changes -- slightly improved, scattered MA -- improved Flat, good foveal reflex, scattered MA -- improved, trace cystic changes -- stably improved   Vessels attenuated, Tortuous attenuated, Tortuous, +NV -- regressing   Periphery Attached, dense 360 PRP extending posteriorly to arcades Attached, 360 PRP with good posterior fill in, room for fill in if needed           IMAGING AND PROCEDURES  Imaging and Procedures for 02/26/2024  OCT, Retina - OU - Both Eyes       Right Eye Quality was good. Central Foveal Thickness: 319. Progression has improved. Findings include normal foveal contour, no IRF, no SRF (Trace persistent cystic changes centrally and inferior macula -- improved).   Left Eye Quality was good. Central Foveal Thickness: 309. Progression has been stable. Findings include normal foveal contour, no SRF, epiretinal membrane, intraretinal fluid, macular pucker (stable improvement in cystic changes perifovea -- resolved; mild ERM and pucker nasal macula).   Notes *Images captured and stored on drive  Diagnosis / Impression:  +DME OU OD: Trace persistent cystic changes centrally and inferior macula -- improved OS: stable improvement in cystic changes perifovea -- resolved; mild ERM and pucker nasal macula  Clinical management:  See below  Abbreviations: NFP - Normal foveal profile. CME - cystoid macular edema. PED - pigment epithelial detachment. IRF - intraretinal fluid. SRF - subretinal fluid. EZ - ellipsoid zone. ERM - epiretinal membrane. ORA - outer retinal atrophy. ORT - outer retinal tubulation. SRHM - subretinal hyper-reflective material. IRHM - intraretinal hyper-reflective material      Intravitreal Injection, Pharmacologic Agent - OD - Right Eye       Time Out 02/26/2024. 8:43 AM. Confirmed correct patient, procedure, site, and patient consented.   Anesthesia Topical anesthesia was used. Anesthetic medications included Lidocaine  2%,  Proparacaine 0.5%.   Procedure Preparation included 5% betadine to ocular surface, eyelid speculum. A (32g) needle was used.   Injection: 2 mg aflibercept  2 MG/0.05ML   Route: Intravitreal, Site: Right Eye   NDC: [id number], Lot: [id number], Expiration date: 05/30/2025, Waste: 0 mL   Post-op Post injection exam found visual acuity of at least counting fingers. The patient tolerated the procedure well. There were no complications. The patient received written and verbal post procedure care education.            ASSESSMENT/PLAN:    ICD-10-CM   1. Proliferative diabetic retinopathy of both eyes with macular edema associated with type 2 diabetes mellitus (HCC)  E11.3513 OCT, Retina - OU - Both Eyes    Intravitreal Injection, Pharmacologic Agent - OD - Right Eye    aflibercept  (EYLEA ) SOLN 2 mg    2. Long term (current) use of oral hypoglycemic drugs  Z79.84     3. Long-term (current) use of injectable non-insulin  antidiabetic drugs  Z79.85     4. Current use of insulin  (HCC)  Z79.4  5. Essential hypertension  I10     6. Hypertensive retinopathy of both eyes  H35.033     7. Combined forms of age-related cataract of both eyes  H25.813      1-4. Proliferative diabetic retinopathy OU - previously managed by Dr. Waylan -- lost to f/u since 2020 - s/p PRP OU and IVA OS x7 (BB) - s/p IVA OD #1 (01.02.24), #2 (02.02.24), #3 (03.01.24), #4(04.05.24), #5 (10.21.24) -- IVA resistance - s/p IVA OS #1 (01.04.24), #2 (02.02.24), #3 (03.01.24), #4(04.05.24), #5 (05.03.24), #6 (06.03.24), #7 (07.01.24), #8 (08.05.24), #9 (09.09.24) - IVA resistance ================================= - s/p IVE OD #1 (05.03.24), #2 (06.03.24), #3 (07.01.24), #4 (08.05.24), #5 (09.09.24), #6 (12.10.24), #7 (01.13.25), #8 (02.24.25), #9 (04.07.25), #10 (06.02.25) - s/p IVE OS #1 (10.21.24), #2 (12.10.24), #3 (04.07.25) - s/p PRP fill in OS (01.23.24) - exam shows scattered MA OU; laser PRP OU - FA  (01.02.23) shows OD: Cluster of leakage temporal macula, no NV; OS: Scattered focal NVE greatest inferior and temporal quads -- will need PRP fill in OS -- completed Jan 2024 - BCVA 20/25 OD -- stable; OS decreased to 20/30 from 20/20 - OCT shows OD: Trace persistent cystic changes centrally and inferior macula -- improved; OS: stable improvement in cystic changes perifovea -- resolved; mild ERM and pucker nasal macula at 8 weeks - recommend IVE OD #11 today, 07.29.25 w/ f/u at 8 wks again - treating OS PRN -- will hold tx in OS today -- pt in agreement - pt wishes to proceed with IVE OD - RBA of procedure discussed, questions answered - see procedure note - IVE informed consent obtained and signed, 05.03.24 (OU) - f/u 8 weeks -- DFE, OCT, possible injxns  5,6. Hypertensive retinopathy OU - discussed importance of tight BP control  - monitor  7. Mixed Cataract OU - The symptoms of cataract, surgical options, and treatments and risks were discussed with patient. - discussed diagnosis and progression - monitor  Ophthalmic Meds Ordered this visit:  Meds ordered this encounter  Medications   aflibercept  (EYLEA ) SOLN 2 mg     Return in about 8 weeks (around 04/22/2024) for f/u PDR OU, DFE, OCT, Possible Injxn.  There are no Patient Instructions on file for this visit.  This document serves as a record of services personally performed by Redell JUDITHANN Hans, MD, PhD. It was created on their behalf by Auston Muzzy, COMT. The creation of this record is the provider's dictation and/or activities during the visit.  Electronically signed by: Auston Muzzy, COMT 02/27/24 1:15 AM  This document serves as a record of services personally performed by Redell JUDITHANN Hans, MD, PhD. It was created on their behalf by Alan PARAS. Delores, OA an ophthalmic technician. The creation of this record is the provider's dictation and/or activities during the visit.    Electronically signed by: Alan PARAS. Delores, OA 02/27/24  1:15 AM  Redell JUDITHANN Hans, M.D., Ph.D. Diseases & Surgery of the Retina and Vitreous [facility]  I have reviewed the above documentation for accuracy and completeness, and I agree with the above. Redell JUDITHANN Hans, M.D., Ph.D. 02/27/24 1:15 AM   Abbreviations: M myopia (nearsighted); A astigmatism; H hyperopia (farsighted); P presbyopia; Mrx spectacle prescription;  CTL contact lenses; OD right eye; OS left eye; OU both eyes  XT exotropia; ET esotropia; PEK punctate epithelial keratitis; PEE punctate epithelial erosions; DES dry eye syndrome; MGD meibomian gland dysfunction; ATs artificial tears; PFAT's preservative free artificial  tears; NSC nuclear sclerotic cataract; PSC posterior subcapsular cataract; ERM epi-retinal membrane; PVD posterior vitreous detachment; RD retinal detachment; DM diabetes mellitus; DR diabetic retinopathy; NPDR non-proliferative diabetic retinopathy; PDR proliferative diabetic retinopathy; CSME clinically significant macular edema; DME diabetic macular edema; dbh dot blot hemorrhages; CWS cotton wool spot; POAG primary open angle glaucoma; C/D cup-to-disc ratio; HVF humphrey visual field; GVF goldmann visual field; OCT optical coherence tomography; IOP intraocular pressure; BRVO Branch retinal vein occlusion; CRVO central retinal vein occlusion; CRAO central retinal artery occlusion; BRAO branch retinal artery occlusion; RT retinal tear; SB scleral buckle; PPV pars plana vitrectomy; VH Vitreous hemorrhage; PRP panretinal laser photocoagulation; IVK intravitreal kenalog ; VMT vitreomacular traction; MH Macular hole;  NVD neovascularization of the disc; NVE neovascularization elsewhere; AREDS age related eye disease study; ARMD age related macular degeneration; POAG primary open angle glaucoma; EBMD epithelial/anterior basement membrane dystrophy; ACIOL anterior chamber intraocular lens; IOL intraocular lens; PCIOL posterior chamber intraocular lens; Phaco/IOL  phacoemulsification with intraocular lens placement; PRK photorefractive keratectomy; LASIK laser assisted in situ keratomileusis; HTN hypertension; DM diabetes mellitus; COPD chronic obstructive pulmonary disease

## 2024-02-26 ENCOUNTER — Encounter (INDEPENDENT_AMBULATORY_CARE_PROVIDER_SITE_OTHER): Payer: Self-pay | Admitting: Ophthalmology

## 2024-02-26 ENCOUNTER — Ambulatory Visit (INDEPENDENT_AMBULATORY_CARE_PROVIDER_SITE_OTHER): Admitting: Ophthalmology

## 2024-02-26 DIAGNOSIS — Z794 Long term (current) use of insulin: Secondary | ICD-10-CM | POA: Diagnosis not present

## 2024-02-26 DIAGNOSIS — H35033 Hypertensive retinopathy, bilateral: Secondary | ICD-10-CM

## 2024-02-26 DIAGNOSIS — Z7984 Long term (current) use of oral hypoglycemic drugs: Secondary | ICD-10-CM | POA: Diagnosis not present

## 2024-02-26 DIAGNOSIS — I10 Essential (primary) hypertension: Secondary | ICD-10-CM

## 2024-02-26 DIAGNOSIS — E113513 Type 2 diabetes mellitus with proliferative diabetic retinopathy with macular edema, bilateral: Secondary | ICD-10-CM | POA: Diagnosis not present

## 2024-02-26 DIAGNOSIS — H25813 Combined forms of age-related cataract, bilateral: Secondary | ICD-10-CM

## 2024-02-26 DIAGNOSIS — Z7985 Long-term (current) use of injectable non-insulin antidiabetic drugs: Secondary | ICD-10-CM | POA: Diagnosis not present

## 2024-02-27 ENCOUNTER — Ambulatory Visit (INDEPENDENT_AMBULATORY_CARE_PROVIDER_SITE_OTHER)

## 2024-02-27 ENCOUNTER — Encounter (INDEPENDENT_AMBULATORY_CARE_PROVIDER_SITE_OTHER): Payer: Self-pay | Admitting: Ophthalmology

## 2024-02-27 DIAGNOSIS — J309 Allergic rhinitis, unspecified: Secondary | ICD-10-CM | POA: Diagnosis not present

## 2024-02-27 MED ORDER — AFLIBERCEPT 2MG/0.05ML IZ SOLN FOR KALEIDOSCOPE
2.0000 mg | INTRAVITREAL | Status: AC | PRN
  Administered 2024-02-27: 2 mg via INTRAVITREAL

## 2024-02-28 ENCOUNTER — Other Ambulatory Visit: Payer: Self-pay | Admitting: Internal Medicine

## 2024-02-28 ENCOUNTER — Telehealth (INDEPENDENT_AMBULATORY_CARE_PROVIDER_SITE_OTHER): Payer: Self-pay

## 2024-02-28 DIAGNOSIS — R110 Nausea: Secondary | ICD-10-CM

## 2024-02-29 ENCOUNTER — Other Ambulatory Visit: Payer: Self-pay | Admitting: Medical Genetics

## 2024-02-29 NOTE — Telephone Encounter (Signed)
Call only

## 2024-03-04 ENCOUNTER — Other Ambulatory Visit

## 2024-03-05 ENCOUNTER — Telehealth: Payer: Self-pay | Admitting: Medical Genetics

## 2024-03-05 NOTE — Telephone Encounter (Signed)
 [facility].  03/05/2024 9:05AM  Participant called to withdraw from [facility]. She requested her lab appointment be cancelled and requested to no longer be contacted about [facility]. Study status has been updated to withdrawn, future lab order cancelled and withdraw request sent to [facility].  Jordyn Pennstrom, BS [place]  [facility] Clinical Research Specialist II Direct Dial: [phone]  Fax: [phone]

## 2024-03-12 ENCOUNTER — Ambulatory Visit (INDEPENDENT_AMBULATORY_CARE_PROVIDER_SITE_OTHER): Payer: Self-pay

## 2024-03-12 DIAGNOSIS — J309 Allergic rhinitis, unspecified: Secondary | ICD-10-CM | POA: Diagnosis not present

## 2024-03-12 NOTE — Progress Notes (Signed)
 VIALS MADE 03-12-24

## 2024-03-13 DIAGNOSIS — J3081 Allergic rhinitis due to animal (cat) (dog) hair and dander: Secondary | ICD-10-CM | POA: Diagnosis not present

## 2024-03-14 ENCOUNTER — Encounter (INDEPENDENT_AMBULATORY_CARE_PROVIDER_SITE_OTHER): Payer: Self-pay | Admitting: Otolaryngology

## 2024-03-14 ENCOUNTER — Ambulatory Visit (INDEPENDENT_AMBULATORY_CARE_PROVIDER_SITE_OTHER): Admitting: Otolaryngology

## 2024-03-14 VITALS — BP 134/70 | HR 76

## 2024-03-14 DIAGNOSIS — H608X3 Other otitis externa, bilateral: Secondary | ICD-10-CM

## 2024-03-14 DIAGNOSIS — H903 Sensorineural hearing loss, bilateral: Secondary | ICD-10-CM | POA: Diagnosis not present

## 2024-03-14 DIAGNOSIS — J3089 Other allergic rhinitis: Secondary | ICD-10-CM | POA: Diagnosis not present

## 2024-03-14 DIAGNOSIS — H6123 Impacted cerumen, bilateral: Secondary | ICD-10-CM

## 2024-03-14 MED ORDER — MOMETASONE FUROATE 0.1 % EX CREA: TOPICAL_CREAM | CUTANEOUS | 3 refills | Status: DC

## 2024-03-16 DIAGNOSIS — H903 Sensorineural hearing loss, bilateral: Secondary | ICD-10-CM | POA: Insufficient documentation

## 2024-03-16 NOTE — Progress Notes (Signed)
 Patient ID: Jodi Ray, female   DOB: 1961/02/24, 63 y.o.   MRN: [id number]  Follow-up: Chronic eczematous otitis externa  HPI: The patient is a 63 year old female who returns today for her follow-up evaluation.  The patient has a history of bilateral severe eczematous otitis externa.  She also has a history of bilateral mild high-frequency sensorineural hearing loss.  Her eczematous otitis externa was treated with Elocon  cream and Ciprodex  eardrops.  The patient returns today complaining of persistent itchy sensation in her ears.  She denies any significant otalgia, otorrhea, vertigo, or change in her hearing.  The medications have helped slightly.  She is requesting a refill for the Elocon  cream.  Exam: General: Communicates without difficulty, well nourished, no acute distress. Head: Normocephalic, no evidence injury, no tenderness, facial buttresses intact without stepoff. Face/sinus: No tenderness to palpation and percussion. Facial movement is normal and symmetric. Eyes: PERRL, EOMI. No scleral icterus, conjunctivae clear. Neuro: CN II exam reveals vision grossly intact.  No nystagmus at any point of gaze. Ears: Auricles well formed without lesions.  Bilateral cerumen impaction.  Nose: External evaluation reveals normal support and skin without lesions.  Dorsum is intact.  Anterior rhinoscopy reveals congested mucosa over anterior aspect of inferior turbinates and intact septum.  No purulence noted. Oral:  Oral cavity and oropharynx are intact, symmetric, without erythema or edema.  Mucosa is moist without lesions. Neck: Full range of motion without pain.  There is no significant lymphadenopathy.  No masses palpable.  Thyroid  bed within normal limits to palpation.  Parotid glands and submandibular glands equal bilaterally without mass.  Trachea is midline. Neuro:  CN 2-12 grossly intact.   Procedure: Bilateral cerumen disimpaction Anesthesia: None Description: Under the operating microscope,  the cerumen is carefully removed with a combination of cerumen currette, alligator forceps, and suction catheters.  After the cerumen is removed, the TMs are noted to be normal.  Eczematous changes are noted within the ear canals.  No mass, erythema, or lesions. The patient tolerated the procedure well.    Assessment: 1.  Bilateral chronic eczematous otitis externa. 2.  Bilateral cerumen impaction. 3.  Subjectively stable bilateral high-frequency sensorineural hearing loss.  Plan: 1.  The physical exam findings are reviewed with the patient. 2.  Otomicroscopy with bilateral cerumen disimpaction. 3.  Elocon  refill.  The patient may use the Elocon  cream daily as needed to treat the chronic eczematous otitis externa. 4.  The patient will return for reevaluation in 4 months.

## 2024-03-20 DIAGNOSIS — K5902 Outlet dysfunction constipation: Secondary | ICD-10-CM

## 2024-03-23 ENCOUNTER — Encounter: Payer: Self-pay | Admitting: Internal Medicine

## 2024-03-24 NOTE — Telephone Encounter (Signed)
 Note sent to Dr Cindie in secure chat. (Dr Cindie is at the hospital today and on call). Waiting on a response

## 2024-03-26 ENCOUNTER — Ambulatory Visit (INDEPENDENT_AMBULATORY_CARE_PROVIDER_SITE_OTHER)

## 2024-03-26 DIAGNOSIS — J309 Allergic rhinitis, unspecified: Secondary | ICD-10-CM | POA: Diagnosis not present

## 2024-04-02 ENCOUNTER — Ambulatory Visit (INDEPENDENT_AMBULATORY_CARE_PROVIDER_SITE_OTHER): Payer: Self-pay

## 2024-04-02 DIAGNOSIS — J309 Allergic rhinitis, unspecified: Secondary | ICD-10-CM | POA: Diagnosis not present
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
## 2024-04-07 ENCOUNTER — Ambulatory Visit (INDEPENDENT_AMBULATORY_CARE_PROVIDER_SITE_OTHER): Admitting: Neurology

## 2024-04-07 ENCOUNTER — Encounter: Payer: Self-pay | Admitting: Neurology

## 2024-04-07 VITALS — BP 120/67 | HR 76 | Resp 20 | Ht 68.0 in | Wt 263.0 lb

## 2024-04-07 DIAGNOSIS — G44019 Episodic cluster headache, not intractable: Secondary | ICD-10-CM

## 2024-04-07 DIAGNOSIS — G43009 Migraine without aura, not intractable, without status migrainosus: Secondary | ICD-10-CM | POA: Diagnosis not present

## 2024-04-07 NOTE — Progress Notes (Signed)
 Medication Samples have been provided to the patient.  Drug name: Holland       Strength: 100 mg        Qty: 4  LOT: [id number]  Exp.Date: 12/26  Dosing instructions: as needed  The patient has been instructed regarding the correct time, dose, and frequency of taking this medication, including desired effects and most common side effects.   Jodi Ray 8:49 AM 04/07/2024

## 2024-04-07 NOTE — Patient Instructions (Addendum)
 At earliest onset of MIGRAINE, take UBRELVY - take 1 tablet at earliest onset of migraine.  May repeat after 2 hours.  Maximum 2 tablets in 24 hours.  Let me know if it works Continue Verapamil  three times daily and Oxygen  as needed

## 2024-04-08 DIAGNOSIS — F411 Generalized anxiety disorder: Secondary | ICD-10-CM | POA: Diagnosis not present

## 2024-04-08 DIAGNOSIS — F330 Major depressive disorder, recurrent, mild: Secondary | ICD-10-CM | POA: Diagnosis not present

## 2024-04-09 ENCOUNTER — Ambulatory Visit (INDEPENDENT_AMBULATORY_CARE_PROVIDER_SITE_OTHER): Payer: Self-pay

## 2024-04-09 DIAGNOSIS — J309 Allergic rhinitis, unspecified: Secondary | ICD-10-CM | POA: Diagnosis not present

## 2024-04-15 NOTE — Therapy (Addendum)
 OUTPATIENT PHYSICAL THERAPY FEMALE PELVIC EVALUATION   Patient Name: Jodi Ray MRN: [id number] DOB:Aug 21, 1960, 63 y.o., female Today's Date: 07/14/2024  END OF SESSION:   Outpatient Rehab from 04/16/2024 in [facility]   04/16/2024    1103  PT Visits / Re-Eval   Visit Number 1  Number of Visits --  Date for Recertification 07/09/2024  Authorization   Authorization Type Medicare/BCBS federal  Authorization Time Period --  Authorization - Visit Number 1  Authorization - Number of Visits 10  Progress Note Due on Visit --  PT Time Calculation   PT Start Time 1100  PT Stop Time 1140  PT Time Calculation (min) 40 min  PT - End of Session   Equipment Utilized During Treatment --  Activity Tolerance Patient tolerated treatment well  Behavior During Therapy [facility] for tasks assessed/performed  Channing Pereyra, PT 07/14/2024 10:52 AM    Past Medical History:  Diagnosis Date   Adult RDS (HCC)    Anemia    Brain aneurysm    frontal lobe   CAD (coronary artery disease)    a. 02/2018: s/p DES to Proximal LAD with residual 20% RCA stenosis.   Chronic pain    Diabetes mellitus without complication (HCC)    Headache    History of left bundle branch block (LBBB)    HTN (hypertension)    Hx of cardiovascular stress test 10/2016   intermediate risk study   Hypothyroidism    Neuromuscular disorder (HCC)    Neuropathy    Obesity    OSA on CPAP 03/10/2015   Renal insufficiency    Retinopathy    Sleep apnea    Varicose veins of both lower extremities    Past Surgical History:  Procedure Laterality Date   ANAL RECTAL MANOMETRY N/A 01/16/2024   Procedure: MANOMETRY, ANORECTAL;  Surgeon: Nandigam, Kavitha V, MD;  Location: [facility];  Service: Gastroenterology;  Laterality: N/A;   BIOPSY  11/14/2016   Procedure: BIOPSY;  Surgeon: Margo LITTIE Haddock, MD;  Location: [facility];  Service: Endoscopy;;  colon gastric duodenal   BIOPSY  03/19/2023    Procedure: BIOPSY;  Surgeon: Cindie Carlin POUR, DO;  Location: [facility];  Service: Endoscopy;;   BREAST EXCISIONAL BIOPSY Right    BREAST SURGERY Left 1994   Lumpectomy   COLONOSCOPY WITH PROPOFOL  N/A 11/14/2016   Dr. haddock: Moderately redundant rectosigmoid colon. Random colon biopsies benign. Internal hemorrhoids. Surveillance colonoscopy in 5 years.   COLONOSCOPY WITH PROPOFOL  N/A 04/20/2022   Procedure: COLONOSCOPY WITH PROPOFOL ;  Surgeon: Cindie Carlin POUR, DO;  Location: [facility];  Service: Endoscopy;  Laterality: N/A;  12:30am   CORONARY PRESSURE/FFR STUDY N/A 06/19/2023   Procedure: CORONARY PRESSURE/FFR STUDY;  Surgeon: Wendel Lurena POUR, MD;  Location: [facility];  Service: Cardiovascular;  Laterality: N/A;   CORONARY STENT INTERVENTION N/A 03/22/2018   Procedure: CORONARY STENT INTERVENTION;  Surgeon: Verlin Lonni BIRCH, MD;  Location: [facility];  Service: Cardiovascular;  Laterality: N/A;   ESOPHAGOGASTRODUODENOSCOPY (EGD) WITH PROPOFOL  N/A 11/14/2016   Dr. haddock: Esophagus normal. Moderate gastritis, few gastric polyps. Duodenal biopsies negative. Fundic gland polyp gastric polyp area no H pylori.   ESOPHAGOGASTRODUODENOSCOPY (EGD) WITH PROPOFOL  N/A 03/19/2023   Procedure: ESOPHAGOGASTRODUODENOSCOPY (EGD) WITH PROPOFOL ;  Surgeon: Cindie Carlin POUR, DO;  Location: [facility];  Service: Endoscopy;  Laterality: N/A;  10:15 am, asa 3   FOOT SURGERY     LEFT HEART CATH AND  CORONARY ANGIOGRAPHY N/A 03/22/2018   Procedure: LEFT HEART CATH AND CORONARY ANGIOGRAPHY;  Surgeon: Verlin Lonni BIRCH, MD;  Location: [facility];  Service: Cardiovascular;  Laterality: N/A;   LEFT HEART CATH AND CORONARY ANGIOGRAPHY N/A 06/19/2023   Procedure: LEFT HEART CATH AND CORONARY ANGIOGRAPHY;  Surgeon: Wendel Lurena POUR, MD;  Location: [facility];  Service: Cardiovascular;  Laterality: N/A;   LEG SURGERY Right    POLYPECTOMY  04/20/2022   Procedure:  POLYPECTOMY;  Surgeon: Cindie Carlin POUR, DO;  Location: [facility];  Service: Endoscopy;;   REPLACEMENT TOTAL KNEE Right    SPINAL CORD STIMULATOR IMPLANT     Patient Active Problem List   Diagnosis Date Noted   Dyssynergic defecation 03/20/2024   Sensorineural hearing loss, bilateral 03/16/2024   Chronic eczematous otitis externa of both ears 07/15/2023   Impacted cerumen of both ears 07/15/2023   Nausea without vomiting 12/28/2022   Generalized abdominal pain 12/28/2022   Constipation 12/28/2022   Seasonal and perennial allergic rhinitis 07/13/2021   Diabetic ketoacidosis without coma associated with type 1 diabetes mellitus (HCC) 11/15/2019   Class 1 obesity 11/15/2019   Acute renal failure superimposed on stage 3 chronic kidney disease (HCC) 11/15/2019   CAD (coronary artery disease) 04/16/2018   Unstable angina (HCC) 03/21/2018   Atypical chest pain 03/20/2018   Gastritis and gastroduodenitis 03/05/2017   Suprapubic pain 03/05/2017   Right sided abdominal pain 10/24/2016   Diarrhea 10/24/2016   Family hx of colon cancer 10/24/2016   Varicose veins of bilateral lower extremities with other complications 05/30/2016   Insomnia with sleep apnea 03/09/2016   Type I diabetes mellitus, uncontrolled 04/30/2015   Mixed hyperlipidemia 04/30/2015   Primary hypothyroidism 04/30/2015   Benign hypertension 04/30/2015   OSA on CPAP 03/10/2015   Migraine without aura and without status migrainosus, not intractable 01/08/2015   Primary stabbing headache 10/02/2014    PCP: Maree Isles, MD  REFERRING PROVIDER: Cindie Carlin POUR, DO   REFERRING DIAG: [phone] (ICD-10-CM) - Rectosphincteric dyssynergia   THERAPY DIAG:  Cramp and spasm - Plan: PT plan of care cert/re-cert  Other lack of coordination - Plan: PT plan of care cert/re-cert  Rectal pain - Plan: PT plan of care cert/re-cert  Lower abdominal pain - Plan: PT plan of care cert/re-cert  Abnormal posture - Plan: PT plan of  care cert/re-cert  Rationale for Evaluation and Treatment: Rehabilitation  ONSET DATE: 2023  SUBJECTIVE:  SUBJECTIVE STATEMENT: I have so much trouble going to the bathroom. Month of August I had constant diarrhea and now I have rock hard stones. The doctor says he may have to take out her colon. Patient has bad cataracts so she is dizzy and uses the walker or cane. Patient not able to lay on her left side due to the pain in the abdomen.  Fluid intake: Drinks a lot of water  FUNCTIONAL LIMITATIONS: uses a siting rolling walker  PERTINENT HISTORY:  Medications for current condition: Amaitiza Surgeries: see above Other: brain aneurysm; diabetes; chronic pain; Hypothryoidism;  Sexual abuse: No  PAIN:  Are you having pain? Yes NPRS scale: 10/10 Pain location: from the abdomen to the buttocks  Pain type: aching, dull, and sharp Pain description: intermittent   Aggravating factors: from the stool being impacted; bowel movements Relieving factors: having a bowel movement  PRECAUTIONS: None  RED FLAGS: None   WEIGHT BEARING RESTRICTIONS: No  FALLS:  Has patient fallen in last 6 months? Yes. Number of falls 1 time due to her diabetes low blood sugar not from balance  OCCUPATION: retired from scientist, research (physical sciences)  ACTIVITY LEVEL : cardiac rehab; stationary bike 1/2 hour per day walk a dog  PLOF: Independent  PATIENT GOALS: so she can have a bowel movement and not have to push with passing out   BOWEL MOVEMENT: Pain with bowel movement: Yes; 10/10 Type of bowel movement:Type (Bristol Stool Scale) Type 1, Frequency every 5-15 days, Strain yes until she feels like she is going to pass out, and Splinting no; can sit on the commode for 30 minutes Fully empty rectum: No; afterwards still has the  feeling to have a bowel movement. She does not get the  urge to have a bowel movement Leakage: No                                                     Fiber supplement/laxative eats a healthy diet  URINATION: Pain with urination: No Fully empty bladder: Yes:                                   Stream: Strong Urgency: Yes , due to being on diuretics Frequency:during the day 8                      Nocturia: No   Leakage: Laughing Pads/briefs: No  INTERCOURSE:not active    PROLAPSE: None   OBJECTIVE:  Note: Objective measures were completed at Evaluation unless otherwise noted.  DIAGNOSTIC FINDINGS:  Underwent anorectal manometry 01/16/2024 which showed normal internal and external anal sphincter pressures, evidence of dyssynergia defecation, rectal hyposensitivity with prolonged balloon expulsion time. Recommended pelvic floor therapy for biofeedback.     COGNITION: Overall cognitive status: Within functional limits for tasks assessed     SENSATION: Light touch: Appears intact   GAIT: Assistive device utilized: Environmental Consultant - 4 wheeled Comments: walks flexed with walker due to being tired from cardiac rehab  POSTURE: rounded shoulders, forward head, decreased lumbar lordosis, and flexed trunk    LUMBARAROM/PROM:decreased by 50% lumbar ROM   PALPATION:   Abdominal: tenderness located on the lower abdomen  Diastasis: No Distortion: increased bulging on the lower abdomen.   Breathing: chest  breather Scar tissue: No                External Perineal Exam: tenderness along the perineal body, tightness around the rectum                             Internal Pelvic Floor: stool in the rectum, no movement of the puborectalis, soreness in the rectum, not able to push the therapist finger out of the rectum due to the pain   Patient confirms identification and approves PT to assess internal pelvic floor and treatment Yes No emotional/communication barriers or cognitive limitation.  Patient is motivated to learn. Patient understands and agrees with treatment goals and plan. PT explains patient will be examined in standing, sitting, and lying down to see how their muscles and joints work. When they are ready, they will be asked to remove their underwear so PT can examine their perineum. The patient is also given the option of providing their own chaperone as one is not provided in our facility. The patient also has the right and is explained the right to defer or refuse any part of the evaluation or treatment including the internal exam. With the patient's consent, PT will use one gloved finger to gently assess the muscles of the pelvic floor, seeing how well it contracts and relaxes and if there is muscle symmetry. After, the patient will get dressed and PT and patient will discuss exam findings and plan of care. PT and patient discuss plan of care, schedule, attendance policy and HEP activities.   PELVIC MMT:   MMT eval  Vaginal   Internal Anal Sphincter 2/5 with slight lift   External Anal Sphincter 2/5 with slight lift   Puborectalis 0/5  (Blank rows = not tested)        TONE: increased   TODAY'S TREATMENT:                                                                                                                              DATE: 04/16/24  EVAL Examination completed, findings reviewed, pt educated on POC, HEP, and female pelvic floor anatomy, reasoning with pelvic floor assessment internally with pt consent. Pt motivated to participate in PT and agreeable to attempt recommendations.     PATIENT EDUCATION:  04/16/24 Education details: educated patient on correct toilet posture with knees above the hips, diaphragmatic breathing, and breathing out to generate pressure in the rectum Person educated: Patient Education method: Explanation, Demonstration, Tactile cues, Verbal cues, and Handouts Education comprehension: verbalized understanding, returned demonstration,  verbal cues required, tactile cues required, and needs further education  HOME EXERCISE PROGRAM: See above.   ASSESSMENT:  CLINICAL IMPRESSION: Patient is a 63 y.o. female who was seen today for physical therapy evaluation and treatment for rectosphincteric dyssynergia. Patient reports for 2 years she has had issues with constipation. She will have 1 bowel movement every 5-15  days that are type 1 stool. She has 10/10 pain with bowel movement. She pushes so hard that she feels like she is going to pass out and blood may come out from pushing so hard. She may stay on the commode for 30 minutes. She does not get the urge to have a bowel movement and just gets a full feeling that she has to have a bowel movement. She does not feel like she has fully emptied her rectum. Therapist not able to place her gloved finger all the way up the rectum due to pain and stool present. Pelvic floor strength is 2/5 with a small lift. Puborectalis is 0/5 with no movement. Tightness along the perineal body and anus. Patient was not able to push the therapist finger out of the rectum due to pain. Patient will benefit from skilled therapy to improve the ability to have a bowel movement and reduce the number of days she does not have a bowel movement.   OBJECTIVE IMPAIRMENTS: decreased activity tolerance, decreased coordination, decreased ROM, increased fascial restrictions, increased muscle spasms, postural dysfunction, and pain.   ACTIVITY LIMITATIONS: toileting  PARTICIPATION LIMITATIONS: driving, shopping, and community activity  PERSONAL FACTORS: Time since onset of injury/illness/exacerbation are also affecting patient's functional outcome.   REHAB POTENTIAL: Good  CLINICAL DECISION MAKING: Evolving/moderate complexity  EVALUATION COMPLEXITY: Moderate   GOALS: Goals reviewed with patient? Yes  SHORT TERM GOALS: Target date: 05/14/24  Patient is independent with initial HEP for diaphragmatic breathing to  reduce tension in the pelvic floor.  Baseline: Goal status: INITIAL  2.  Patient is able to return demonstration for sitting on the commode to have a bowel movement and breath correctly.  Baseline:  Goal status: INITIAL  3.  Patient educated on abdominal massage to improve peristalic motion of the intestines.  Baseline:  Goal status: INITIAL   LONG TERM GOALS: Target date: 07/09/24  Patient independent with advanced HEP for pelvic floor relaxation, abdominal contraction to improve the ability to have a bowel movement.  Baseline:  Goal status: INITIAL  2.  Patient reports she is able to have a bowel movement every 3 days with pain level </= 3/10 due to elongation of pelvic floor.  Baseline:  Goal status: INITIAL  3.  Patient is able to move the puborectalis forward to improve the anorectal angle for stool to move through with greater ease.  Baseline:  Goal status: INITIAL  4.  Patient has reduction of lower abdominal pain </= 2/10 due to the ability of the stool to move through the intestines.  Baseline:  Goal status: INITIAL   PLAN:  PT FREQUENCY: 1-2x/week  PT DURATION: 12 weeks  PLANNED INTERVENTIONS: 97110-Therapeutic exercises, 97530- Therapeutic activity, 97112- Neuromuscular re-education, 97535- Self Care, 02859- Manual therapy, G0283- Electrical stimulation (unattended), 20560 (1-2 muscles), 20561 (3+ muscles)- Dry Needling, Patient/Family education, Joint mobilization, Spinal mobilization, Cryotherapy, Moist heat, and Biofeedback  PLAN FOR NEXT SESSION: work on rib expansion for diaphragmatic breathing; abdominal manual work; hip stretches, review on ways to have a bowel movement  Channing Pereyra, PT 07/14/2024 10:51 AM  PHYSICAL THERAPY DISCHARGE SUMMARY  Visits from Start of Care: 1  Current functional level related to goals / functional outcomes: See above. Patient did not return after her last visit on 04/16/24.    Remaining deficits: See above.     Education / Equipment: HEP   Patient agrees to discharge. Patient goals were not met. Patient is being discharged due to not returning since the last  visit. Thank you for the referral.   Channing Pereyra, PT 07/14/2024 10:51 AM

## 2024-04-16 ENCOUNTER — Encounter: Payer: Self-pay | Admitting: Physical Therapy

## 2024-04-16 ENCOUNTER — Ambulatory Visit: Attending: Internal Medicine | Admitting: Physical Therapy

## 2024-04-16 ENCOUNTER — Other Ambulatory Visit: Payer: Self-pay

## 2024-04-16 DIAGNOSIS — K6289 Other specified diseases of anus and rectum: Secondary | ICD-10-CM | POA: Diagnosis not present

## 2024-04-16 DIAGNOSIS — R252 Cramp and spasm: Secondary | ICD-10-CM | POA: Insufficient documentation

## 2024-04-16 DIAGNOSIS — R1030 Lower abdominal pain, unspecified: Secondary | ICD-10-CM | POA: Diagnosis not present

## 2024-04-16 DIAGNOSIS — K5902 Outlet dysfunction constipation: Secondary | ICD-10-CM | POA: Insufficient documentation

## 2024-04-16 DIAGNOSIS — R278 Other lack of coordination: Secondary | ICD-10-CM | POA: Diagnosis not present

## 2024-04-16 DIAGNOSIS — R293 Abnormal posture: Secondary | ICD-10-CM | POA: Insufficient documentation

## 2024-04-16 NOTE — Patient Instructions (Signed)
 How To Poop Better:  What are Good Poops? There is no one exact normal, but they should be REGULAR.  This varies from person to person and ranges from up to 3x/day or as little as 3-4/week.  This should stay consistent for you.  They should be formed and ideally one solid mass that doesn't fall apart or dissolve in the water and is Reitz in color.  Lifestyle Tips:  Fiber: Eat 25-31 grams per day Do not hold it.  If you need to go, GO! Try to go every day around the same time Walk and move more Probiotics for more healthy gut bacteria Water and fluids: half of your healthy body weight in ounces  Toileting Tips:  Posture: knees above hips, back flat, look straight ahead, RELAX Relax all the muscles from your face down to your toes Breathe: slow deep breaths into your belly and pelvic floor is RELAXED Blow: Tighten belly and blow like blowing up a balloon, make "SH" sound, make a vowel sound with a deep voice Do NOT sit more than 10 minutes After you are finished, tighten the muscles to reset pelvic floor back to normal      [facility] [address] [place], [place] [address] Phone # [phone] Fax [phone]

## 2024-04-16 NOTE — Progress Notes (Signed)
 [facility] - Clinic Note  04/23/2024     CHIEF COMPLAINT Patient presents for Retina Follow Up   HISTORY OF PRESENT ILLNESS: Jodi Ray is a 63 y.o. female who presents to the clinic today for:   HPI     Retina Follow Up   Patient presents with  Diabetic Retinopathy.  In both eyes.  This started 8 weeks ago.  I, the attending physician,  performed the HPI with the patient and updated documentation appropriately.        Comments   Patient here for 8 weeks retina follow up for PDR OU. Patient states vision has cataracts. Sees Dr Lavonia end of October for cataract eval. OS sore uses AT's. Has 2 new meds. Torsemide and spironolactone.      Last edited by Valdemar Rogue, MD on 04/23/2024  4:59 PM.    Pt states she's seeing a new eye doctor in [place], Dr. Deward Chow.   Referring physician: Chow Deward BIRCH, OD [address] [place],  [place] [address]  HISTORICAL INFORMATION:   Selected notes from the MEDICAL RECORD NUMBER Referred by Dr. Vivian for concern of DME LEE:  Ocular Hx- PMH-    CURRENT MEDICATIONS: No current outpatient medications on file. (Ophthalmic Drugs)   No current facility-administered medications for this visit. (Ophthalmic Drugs)   Current Outpatient Medications (Other)  Medication Sig   ALPRAZolam (XANAX) 0.5 MG tablet Take 0.5 mg by mouth once a week.   aspirin  EC 81 MG tablet Take 1 tablet (81 mg total) by mouth daily. Swallow whole.   atorvastatin  (LIPITOR) 80 MG tablet TAKE 1 TABLET BY MOUTH ONCE DAILY AT  6PM   calcitRIOL (ROCALTROL) 0.25 MCG capsule Take 0.25 mcg by mouth every Monday, Wednesday, and Friday.   Continuous Blood Gluc Sensor (DEXCOM G6 SENSOR) MISC 1 Device by Other route as directed.   EPINEPHRINE  0.3 mg/0.3 mL IJ SOAJ injection INJECT CONTENTS OF ONE PEN INTO THE MUSCLE AS NEEDED FOR ANAPHYLAXIS   Insulin  Human (INSULIN  PUMP) SOLN Inject into the skin continuous.    levocetirizine (XYZAL ) 5 MG tablet  Take 1 tablet (5 mg total) by mouth daily as needed for allergies (Can take an extra dose during flare ups.).   levothyroxine  (SYNTHROID ) 112 MCG tablet Take 112 mcg by mouth daily before breakfast.   lubiprostone  (AMITIZA ) 24 MCG capsule Take 1 capsule (24 mcg total) by mouth 2 (two) times daily with a meal.   metFORMIN  (GLUCOPHAGE ) 500 MG tablet Take 1 tablet (500 mg total) by mouth 2 (two) times daily with a meal.   mometasone  (ELOCON ) 0.1 % cream Apply topically daily as needed for itch   nitroGLYCERIN  (NITROSTAT ) 0.4 MG SL tablet Place 1 tablet (0.4 mg total) under the tongue every 5 (five) minutes as needed for chest pain.   NOVOLOG  100 UNIT/ML injection Inject into the skin continuous. In insulin  pump   pantoprazole  (PROTONIX ) 40 MG tablet TAKE 1 TABLET BY MOUTH TWICE DAILY BEFORE A MEAL   verapamil  (CALAN ) 80 MG tablet Take 1 tablet (80 mg total) by mouth 3 (three) times daily.   carvedilol  (COREG ) 12.5 MG tablet Take 1 tablet (12.5 mg total) by mouth 2 (two) times daily.   furosemide  (LASIX ) 40 MG tablet Take 1 tablet (40 mg total) by mouth 2 (two) times daily. (Patient not taking: Reported on 04/23/2024)   hydrALAZINE  (APRESOLINE ) 25 MG tablet Take 1 tablet (25 mg total) by mouth 3 (three) times daily.  JARDIANCE 10 MG TABS tablet Take 5 mg by mouth every Monday, Wednesday, and Friday.   KERENDIA 10 MG TABS Take 10 mg by mouth every Monday, Wednesday, and Friday. (Patient not taking: Reported on 04/23/2024)   No current facility-administered medications for this visit. (Other)   REVIEW OF SYSTEMS: ROS   Positive for: Endocrine, Cardiovascular, Eyes Negative for: Constitutional, Gastrointestinal, Neurological, Skin, Genitourinary, Musculoskeletal, HENT, Respiratory, Psychiatric, Allergic/Imm, Heme/Lymph Last edited by Orval Asberry RAMAN, COA on 04/23/2024  8:28 AM.      ALLERGIES Allergies  Allergen Reactions   Other Nausea And Vomiting and Other (See Comments)    Opiates Alters  mental status diarrhea   Fentanyl Nausea And Vomiting and Other (See Comments)    Nausea, vomiting, loss of consciousness, requiring reversal   PAST MEDICAL HISTORY Past Medical History:  Diagnosis Date   Adult RDS (HCC)    Anemia    Brain aneurysm    frontal lobe   CAD (coronary artery disease)    a. 02/2018: s/p DES to Proximal LAD with residual 20% RCA stenosis.   Chronic pain    Diabetes mellitus without complication (HCC)    Headache    History of left bundle branch block (LBBB)    HTN (hypertension)    Hx of cardiovascular stress test 10/2016   intermediate risk study   Hypothyroidism    Neuromuscular disorder (HCC)    Neuropathy    Obesity    OSA on CPAP 03/10/2015   Renal insufficiency    Retinopathy    Sleep apnea    Varicose veins of both lower extremities    Past Surgical History:  Procedure Laterality Date   ANAL RECTAL MANOMETRY N/A 01/16/2024   Procedure: MANOMETRY, ANORECTAL;  Surgeon: Nandigam, Kavitha V, MD;  Location: [facility];  Service: Gastroenterology;  Laterality: N/A;   BIOPSY  11/14/2016   Procedure: BIOPSY;  Surgeon: Margo LITTIE Haddock, MD;  Location: [facility];  Service: Endoscopy;;  colon gastric duodenal   BIOPSY  03/19/2023   Procedure: BIOPSY;  Surgeon: Cindie Carlin POUR, DO;  Location: [facility];  Service: Endoscopy;;   BREAST EXCISIONAL BIOPSY Right    BREAST SURGERY Left 1994   Lumpectomy   COLONOSCOPY WITH PROPOFOL  N/A 11/14/2016   Dr. haddock: Moderately redundant rectosigmoid colon. Random colon biopsies benign. Internal hemorrhoids. Surveillance colonoscopy in 5 years.   COLONOSCOPY WITH PROPOFOL  N/A 04/20/2022   Procedure: COLONOSCOPY WITH PROPOFOL ;  Surgeon: Cindie Carlin POUR, DO;  [facility]: [facility];  Service: Endoscopy;  Laterality: N/A;  12:30am   CORONARY PRESSURE/FFR STUDY N/A 06/19/2023   Procedure: CORONARY PRESSURE/FFR STUDY;  Surgeon: Wendel Lurena POUR, MD;  Location: [facility];  Service: Cardiovascular;   Laterality: N/A;   CORONARY STENT INTERVENTION N/A 03/22/2018   Procedure: CORONARY STENT INTERVENTION;  Surgeon: Verlin Lonni BIRCH, MD;  Location: [facility];  Service: Cardiovascular;  Laterality: N/A;   ESOPHAGOGASTRODUODENOSCOPY (EGD) WITH PROPOFOL  N/A 11/14/2016   Dr. haddock: Esophagus normal. Moderate gastritis, few gastric polyps. Duodenal biopsies negative. Fundic gland polyp gastric polyp area no H pylori.   ESOPHAGOGASTRODUODENOSCOPY (EGD) WITH PROPOFOL  N/A 03/19/2023   Procedure: ESOPHAGOGASTRODUODENOSCOPY (EGD) WITH PROPOFOL ;  Surgeon: Cindie Carlin POUR, DO;  [facility];  Service: Endoscopy;  Laterality: N/A;  10:15 am, asa 3   FOOT SURGERY     LEFT HEART CATH AND CORONARY ANGIOGRAPHY N/A 03/22/2018   Procedure: LEFT HEART CATH AND CORONARY ANGIOGRAPHY;  Surgeon: Verlin Lonni BIRCH, MD;  [facility]: [facility];  Service: Cardiovascular;  Laterality: N/A;   LEFT HEART CATH AND CORONARY ANGIOGRAPHY N/A 06/19/2023   Procedure: LEFT HEART CATH AND CORONARY ANGIOGRAPHY;  Surgeon: Wendel Lurena POUR, MD;  Location: [facility];  Service: Cardiovascular;  Laterality: N/A;   LEG SURGERY Right    POLYPECTOMY  04/20/2022   Procedure: POLYPECTOMY;  Surgeon: Cindie Carlin POUR, DO;  [facility]: [facility];  Service: Endoscopy;;   REPLACEMENT TOTAL KNEE Right    SPINAL CORD STIMULATOR IMPLANT     FAMILY HISTORY Family History  Problem Relation Age of Onset   Cancer Mother 29       colon   Cancer Father        renal cell   Colon cancer Father    Breast cancer Sister    Cancer Sister        primary brain, and primary breast too   Breast cancer Sister    Cancer Sister        breast    Breast cancer Sister    Breast cancer Sister    Cancer Brother        kidney and liver   Sleep apnea Neg Hx    SOCIAL HISTORY Social History   Tobacco Use   Smoking status: Never   Smokeless tobacco: Never  Vaping Use   Vaping status: Never Used   Substance Use Topics   Alcohol  use: No    Alcohol /week: 0.0 standard drinks of alcohol    Drug use: No       OPHTHALMIC EXAM:  Base Eye Exam     Visual Acuity (Snellen - Linear)       Right Left   Dist cc 20/40 -2 20/30 -1   Dist ph cc 20/30 -2 20/25 -1    Correction: Glasses         Tonometry (Tonopen, 8:25 AM)       Right Left   Pressure 16 16         Pupils       Dark Light Shape React APD   Right 3 2 Round Brisk None   Left 3 2 Round Brisk None         Visual Fields (Counting fingers)       Left Right    Full Full         Extraocular Movement       Right Left    Full, Ortho Full, Ortho         Neuro/Psych     Oriented x3: Yes   Mood/Affect: Normal         Dilation     Both eyes: 1.0% Mydriacyl, 2.5% Phenylephrine  @ 8:25 AM           Slit Lamp and Fundus Exam     Slit Lamp Exam       Right Left   Lids/Lashes Dermatochalasis - upper lid, mild MGD Dermatochalasis - upper lid, mild MGD   Conjunctiva/Sclera nasal pingeucula nasal pingeucula, Trace Injection   Cornea arcus, mild tear film debris arcus, 1+ fine Punctate epithelial erosions   Anterior Chamber deep and clear, no cell or flare deep and clear, no cell or flare   Iris Round and dilated, No NVI Round and dilated, No NVI   Lens 2-3+ Nuclear sclerosis, 2-3+ Cortical cataract, 1+ Posterior subcapsular cataract 2-3+ Nuclear sclerosis, 2-3+ Cortical cataract, 1+ Posterior subcapsular cataract   Anterior Vitreous Vitreous syneresis Vitreous syneresis  Fundus Exam       Right Left   Disc Pink and Sharp, no NVD, temporal PPA Pink and Sharp, no NVD, temporal PPP   C/D Ratio 0.4 0.4   Macula Good foveal reflex, +cystic changes -- slightly improved, scattered MA -- improved Flat, good foveal reflex, scattered MA -- improved, trace cystic changes -- stably improved   Vessels attenuated, Tortuous attenuated, Tortuous, +NV -- regressing   Periphery Attached, dense 360  PRP extending posteriorly to arcades Attached, 360 PRP with good posterior fill in, room for fill in if needed           Refraction     Wearing Rx       Sphere Cylinder Axis Add   Right +1.75 +1.50 124 +2.50   Left +4.25 +2.25 001 +2.50           IMAGING AND PROCEDURES  Imaging and Procedures for 04/23/2024  OCT, Retina - OU - Both Eyes       Right Eye Quality was good. Central Foveal Thickness: 323. Progression has improved. Findings include normal foveal contour, no IRF, no SRF (Interval improvement in cystic changes centrally and inferior macula--just trace cystic changes remain. ).   Left Eye Quality was good. Central Foveal Thickness: 318. Progression has been stable. Findings include normal foveal contour, no SRF, epiretinal membrane, intraretinal fluid, macular pucker (stable improvement in cystic changes perifovea -- stably resolved; mild ERM and pucker nasal macula).   Notes *Images captured and stored on drive  Diagnosis / Impression:  +DME OU OD: Interval improvement in cystic changes centrally and inferior macula--just trace cystic changes remain.  OS: stable improvement in cystic changes perifovea -- stably resolved; mild ERM and pucker nasal macula  Clinical management:  See below  Abbreviations: NFP - Normal foveal profile. CME - cystoid macular edema. PED - pigment epithelial detachment. IRF - intraretinal fluid. SRF - subretinal fluid. EZ - ellipsoid zone. ERM - epiretinal membrane. ORA - outer retinal atrophy. ORT - outer retinal tubulation. SRHM - subretinal hyper-reflective material. IRHM - intraretinal hyper-reflective material      Intravitreal Injection, Pharmacologic Agent - OD - Right Eye       Time Out 04/23/2024. 9:15 AM. Confirmed correct patient, procedure, site, and patient consented.   Anesthesia Topical anesthesia was used. Anesthetic medications included Lidocaine  2%, Proparacaine 0.5%.   Procedure Preparation included 5%  betadine to ocular surface, eyelid speculum. A (32g) needle was used.   Injection: 2 mg aflibercept  2 MG/0.05ML   Route: Intravitreal, Site: Right Eye   NDC: [id number], Lot: [id number], Expiration date: 05/30/2025, Waste: 0 mL   Post-op Post injection exam found visual acuity of at least counting fingers. The patient tolerated the procedure well. There were no complications. The patient received written and verbal post procedure care education.             ASSESSMENT/PLAN:    ICD-10-CM   1. Proliferative diabetic retinopathy of both eyes with macular edema associated with type 2 diabetes mellitus (HCC)  E11.3513 OCT, Retina - OU - Both Eyes    Intravitreal Injection, Pharmacologic Agent - OD - Right Eye    aflibercept  (EYLEA ) SOLN 2 mg    2. Long-term (current) use of injectable non-insulin  antidiabetic drugs  Z79.85     3. Long term (current) use of oral hypoglycemic drugs  Z79.84     4. Current use of insulin  (HCC)  Z79.4     5. Essential hypertension  I10  6. Hypertensive retinopathy of both eyes  H35.033     7. Combined forms of age-related cataract of both eyes  H25.813      1-4. Proliferative diabetic retinopathy OU - previously managed by Dr. Waylan -- lost to f/u since 2020 - s/p PRP OU and IVA OS x7 (BB) - s/p IVA OD #1 (01.02.24), #2 (02.02.24), #3 (03.01.24), #4(04.05.24), #5 (10.21.24) -- IVA resistance - s/p IVA OS #1 (01.04.24), #2 (02.02.24), #3 (03.01.24), #4(04.05.24), #5 (05.03.24), #6 (06.03.24), #7 (07.01.24), #8 (08.05.24), #9 (09.09.24) - IVA resistance ================================= - s/p IVE OD #1 (05.03.24), #2 (06.03.24), #3 (07.01.24), #4 (08.05.24), #5 (09.09.24), #6 (12.10.24), #7 (01.13.25), #8 (02.24.25), #9 (04.07.25), #10 (06.02.25), #11 (07.29.25) - s/p IVE OS #1 (10.21.24), #2 (12.10.24), #3 (04.07.25) - s/p PRP fill in OS (01.23.24) - exam shows scattered MA OU; laser PRP OU - FA (01.02.23) shows OD: Cluster of leakage temporal  macula, no NV; OS: Scattered focal NVE greatest inferior and temporal quads -- will need PRP fill in OS -- completed Jan 2024 - BCVA OD 20/30 from 20/25; OS 20/25 from 20/30 - OCT shows OD: Interval improvement in cystic changes centrally and inferior macula--just trace cystic changes remain; OS: stable improvement in cystic changes perifovea -- stably resolved; mild ERM and pucker nasal macula at 8 weeks - recommend IVE OD #12 today, 09.17.25 w/ f/u at 8 wks again - treating OS PRN -- will hold tx in OS today -- pt in agreement - pt wishes to proceed with IVE OD - RBA of procedure discussed, questions answered - see procedure note - IVE informed consent obtained and signed, 05.03.24 (OU) - f/u 8 weeks -- DFE, OCT, repeat FA trans OD, possible injxns  5,6. Hypertensive retinopathy OU - discussed importance of tight BP control  - monitor  7. Mixed Cataract OU - The symptoms of cataract, surgical options, and treatments and risks were discussed with patient. - discussed diagnosis and progression - monitor--Pt scheduled to see Dr. Lavonia for cataract eval.   Ophthalmic Meds Ordered this visit:  Meds ordered this encounter  Medications   aflibercept  (EYLEA ) SOLN 2 mg     Return in about 8 weeks (around 06/18/2024) for PDR OU, DFE, OCT, FA trans OD, Possible Injxn.  There are no Patient Instructions on file for this visit.  This document serves as a record of services personally performed by Redell JUDITHANN Hans, MD, PhD. It was created on their behalf by Almetta Pesa, an ophthalmic technician. The creation of this record is the provider's dictation and/or activities during the visit.    Electronically signed by: Almetta Pesa, OA, 04/27/24  4:09 PM   Redell JUDITHANN Hans, M.D., Ph.D. Diseases & Surgery of the Retina and Vitreous [facility]  I have reviewed the above documentation for accuracy and completeness, and I agree with the above. Redell JUDITHANN Hans, M.D.,  Ph.D. 04/27/24 4:13 PM   Abbreviations: M myopia (nearsighted); A astigmatism; H hyperopia (farsighted); P presbyopia; Mrx spectacle prescription;  CTL contact lenses; OD right eye; OS left eye; OU both eyes  XT exotropia; ET esotropia; PEK punctate epithelial keratitis; PEE punctate epithelial erosions; DES dry eye syndrome; MGD meibomian gland dysfunction; ATs artificial tears; PFAT's preservative free artificial tears; NSC nuclear sclerotic cataract; PSC posterior subcapsular cataract; ERM epi-retinal membrane; PVD posterior vitreous detachment; RD retinal detachment; DM diabetes mellitus; DR diabetic retinopathy; NPDR non-proliferative diabetic retinopathy; PDR proliferative diabetic retinopathy; CSME clinically significant macular edema; DME diabetic macular edema;  dbh dot blot hemorrhages; CWS cotton wool spot; POAG primary open angle glaucoma; C/D cup-to-disc ratio; HVF humphrey visual field; GVF goldmann visual field; OCT optical coherence tomography; IOP intraocular pressure; BRVO Branch retinal vein occlusion; CRVO central retinal vein occlusion; CRAO central retinal artery occlusion; BRAO branch retinal artery occlusion; RT retinal tear; SB scleral buckle; PPV pars plana vitrectomy; VH Vitreous hemorrhage; PRP panretinal laser photocoagulation; IVK intravitreal kenalog ; VMT vitreomacular traction; MH Macular hole;  NVD neovascularization of the disc; NVE neovascularization elsewhere; AREDS age related eye disease study; ARMD age related macular degeneration; POAG primary open angle glaucoma; EBMD epithelial/anterior basement membrane dystrophy; ACIOL anterior chamber intraocular lens; IOL intraocular lens; PCIOL posterior chamber intraocular lens; Phaco/IOL phacoemulsification with intraocular lens placement; PRK photorefractive keratectomy; LASIK laser assisted in situ keratomileusis; HTN hypertension; DM diabetes mellitus; COPD chronic obstructive pulmonary disease

## 2024-04-17 DIAGNOSIS — E1151 Type 2 diabetes mellitus with diabetic peripheral angiopathy without gangrene: Secondary | ICD-10-CM | POA: Diagnosis not present

## 2024-04-17 DIAGNOSIS — E1140 Type 2 diabetes mellitus with diabetic neuropathy, unspecified: Secondary | ICD-10-CM | POA: Diagnosis not present

## 2024-04-18 DIAGNOSIS — E1129 Type 2 diabetes mellitus with other diabetic kidney complication: Secondary | ICD-10-CM | POA: Diagnosis not present

## 2024-04-18 DIAGNOSIS — I5033 Acute on chronic diastolic (congestive) heart failure: Secondary | ICD-10-CM | POA: Diagnosis not present

## 2024-04-18 DIAGNOSIS — N1831 Chronic kidney disease, stage 3a: Secondary | ICD-10-CM | POA: Diagnosis not present

## 2024-04-18 DIAGNOSIS — R809 Proteinuria, unspecified: Secondary | ICD-10-CM | POA: Diagnosis not present

## 2024-04-23 ENCOUNTER — Ambulatory Visit (INDEPENDENT_AMBULATORY_CARE_PROVIDER_SITE_OTHER): Admitting: Ophthalmology

## 2024-04-23 ENCOUNTER — Encounter (INDEPENDENT_AMBULATORY_CARE_PROVIDER_SITE_OTHER): Payer: Self-pay | Admitting: Ophthalmology

## 2024-04-23 DIAGNOSIS — Z7984 Long term (current) use of oral hypoglycemic drugs: Secondary | ICD-10-CM | POA: Diagnosis not present

## 2024-04-23 DIAGNOSIS — I10 Essential (primary) hypertension: Secondary | ICD-10-CM | POA: Diagnosis not present

## 2024-04-23 DIAGNOSIS — H35033 Hypertensive retinopathy, bilateral: Secondary | ICD-10-CM | POA: Diagnosis not present

## 2024-04-23 DIAGNOSIS — Z794 Long term (current) use of insulin: Secondary | ICD-10-CM | POA: Diagnosis not present

## 2024-04-23 DIAGNOSIS — Z7985 Long-term (current) use of injectable non-insulin antidiabetic drugs: Secondary | ICD-10-CM | POA: Diagnosis not present

## 2024-04-23 DIAGNOSIS — E113513 Type 2 diabetes mellitus with proliferative diabetic retinopathy with macular edema, bilateral: Secondary | ICD-10-CM | POA: Diagnosis not present

## 2024-04-23 DIAGNOSIS — H25813 Combined forms of age-related cataract, bilateral: Secondary | ICD-10-CM

## 2024-04-23 MED ORDER — AFLIBERCEPT 2MG/0.05ML IZ SOLN FOR KALEIDOSCOPE
2.0000 mg | INTRAVITREAL | Status: AC | PRN
  Administered 2024-04-23: 2 mg via INTRAVITREAL

## 2024-04-24 ENCOUNTER — Other Ambulatory Visit (HOSPITAL_COMMUNITY)
Admission: RE | Admit: 2024-04-24 | Discharge: 2024-04-24 | Disposition: A | Discharge status: Home / Self Care | Source: Ambulatory Visit | Attending: Nephrology | Admitting: Nephrology

## 2024-04-24 DIAGNOSIS — D631 Anemia in chronic kidney disease: Secondary | ICD-10-CM | POA: Insufficient documentation

## 2024-04-24 DIAGNOSIS — R809 Proteinuria, unspecified: Secondary | ICD-10-CM | POA: Diagnosis not present

## 2024-04-24 DIAGNOSIS — N189 Chronic kidney disease, unspecified: Secondary | ICD-10-CM | POA: Insufficient documentation

## 2024-04-24 LAB — RENAL FUNCTION PANEL
Albumin: 3.8 g/dL (ref 3.5–5.0)
Anion gap: 13 (ref 5–15)
BUN: 34 mg/dL — ABNORMAL HIGH (ref 8–23)
CO2: 28 mmol/L (ref 22–32)
Calcium: 9.1 mg/dL (ref 8.9–10.3)
Chloride: 99 mmol/L (ref 98–111)
Creatinine, Ser: 1.43 mg/dL — ABNORMAL HIGH (ref 0.44–1.00)
GFR, Estimated: 41 mL/min — ABNORMAL LOW (ref >60)
Glucose, Bld: 180 mg/dL — ABNORMAL HIGH (ref 70–99)
Phosphorus: 3.8 mg/dL (ref 2.5–4.6)
Potassium: 4.1 mmol/L (ref 3.5–5.1)
Sodium: 140 mmol/L (ref 135–145)

## 2024-04-24 LAB — PROTEIN / CREATININE RATIO, URINE
Creatinine, Urine: 186 mg/dL
Protein Creatinine Ratio: 0.08 mg/mg{creat} (ref 0.00–0.15)
Total Protein, Urine: 14 mg/dL

## 2024-04-24 LAB — CBC
HCT: 39.1 % (ref 36.0–46.0)
Hemoglobin: 12.2 g/dL (ref 12.0–15.0)
MCH: 26.2 pg (ref 26.0–34.0)
MCHC: 31.2 g/dL (ref 30.0–36.0)
MCV: 84.1 fL (ref 80.0–100.0)
Platelets: 275 K/uL (ref 150–400)
RBC: 4.65 MIL/uL (ref 3.87–5.11)
RDW: 18.9 % — ABNORMAL HIGH (ref 11.5–15.5)
WBC: 6.4 K/uL (ref 4.0–10.5)
nRBC: 0 % (ref 0.0–0.2)

## 2024-04-29 ENCOUNTER — Telehealth: Payer: Self-pay

## 2024-04-29 NOTE — Telephone Encounter (Signed)
 This patient called and was extremely upset. She stated that she had called yesterday to confirm her appointment and believed everything was set. However, upon arriving at the office today, she was informed that no appointment had been scheduled. As a result, she requested that all future appointments be canceled and expressed that she will not be returning.

## 2024-05-01 DIAGNOSIS — I5032 Chronic diastolic (congestive) heart failure: Secondary | ICD-10-CM | POA: Diagnosis not present

## 2024-05-01 DIAGNOSIS — N1832 Chronic kidney disease, stage 3b: Secondary | ICD-10-CM | POA: Diagnosis not present

## 2024-05-01 DIAGNOSIS — E1122 Type 2 diabetes mellitus with diabetic chronic kidney disease: Secondary | ICD-10-CM | POA: Diagnosis not present

## 2024-05-01 DIAGNOSIS — N2581 Secondary hyperparathyroidism of renal origin: Secondary | ICD-10-CM | POA: Diagnosis not present

## 2024-05-06 ENCOUNTER — Encounter: Payer: Self-pay | Admitting: Physical Therapy

## 2024-05-06 DIAGNOSIS — M65941 Unspecified synovitis and tenosynovitis, right hand: Secondary | ICD-10-CM | POA: Diagnosis not present

## 2024-05-06 DIAGNOSIS — E785 Hyperlipidemia, unspecified: Secondary | ICD-10-CM | POA: Diagnosis not present

## 2024-05-06 DIAGNOSIS — E109 Type 1 diabetes mellitus without complications: Secondary | ICD-10-CM | POA: Diagnosis not present

## 2024-05-06 DIAGNOSIS — Z6839 Body mass index (BMI) 39.0-39.9, adult: Secondary | ICD-10-CM | POA: Diagnosis not present

## 2024-05-06 DIAGNOSIS — R030 Elevated blood-pressure reading, without diagnosis of hypertension: Secondary | ICD-10-CM | POA: Diagnosis not present

## 2024-05-06 DIAGNOSIS — I10 Essential (primary) hypertension: Secondary | ICD-10-CM | POA: Diagnosis not present

## 2024-05-06 DIAGNOSIS — Z9641 Presence of insulin pump (external) (internal): Secondary | ICD-10-CM | POA: Diagnosis not present

## 2024-05-06 DIAGNOSIS — M13841 Other specified arthritis, right hand: Secondary | ICD-10-CM | POA: Diagnosis not present

## 2024-05-06 DIAGNOSIS — E039 Hypothyroidism, unspecified: Secondary | ICD-10-CM | POA: Diagnosis not present

## 2024-05-06 DIAGNOSIS — Z794 Long term (current) use of insulin: Secondary | ICD-10-CM | POA: Diagnosis not present

## 2024-05-06 DIAGNOSIS — E669 Obesity, unspecified: Secondary | ICD-10-CM | POA: Diagnosis not present

## 2024-05-06 DIAGNOSIS — E1140 Type 2 diabetes mellitus with diabetic neuropathy, unspecified: Secondary | ICD-10-CM | POA: Diagnosis not present

## 2024-05-07 ENCOUNTER — Ambulatory Visit (INDEPENDENT_AMBULATORY_CARE_PROVIDER_SITE_OTHER): Payer: Self-pay

## 2024-05-07 DIAGNOSIS — J309 Allergic rhinitis, unspecified: Secondary | ICD-10-CM | POA: Diagnosis not present

## 2024-05-08 ENCOUNTER — Encounter: Admitting: Physical Therapy

## 2024-05-20 ENCOUNTER — Encounter: Payer: Self-pay | Admitting: Physical Therapy

## 2024-05-23 DIAGNOSIS — H2511 Age-related nuclear cataract, right eye: Secondary | ICD-10-CM | POA: Diagnosis not present

## 2024-05-23 DIAGNOSIS — E103593 Type 1 diabetes mellitus with proliferative diabetic retinopathy without macular edema, bilateral: Secondary | ICD-10-CM | POA: Diagnosis not present

## 2024-05-23 DIAGNOSIS — H2513 Age-related nuclear cataract, bilateral: Secondary | ICD-10-CM | POA: Diagnosis not present

## 2024-05-23 DIAGNOSIS — H18413 Arcus senilis, bilateral: Secondary | ICD-10-CM | POA: Diagnosis not present

## 2024-05-23 DIAGNOSIS — H25043 Posterior subcapsular polar age-related cataract, bilateral: Secondary | ICD-10-CM | POA: Diagnosis not present

## 2024-05-23 DIAGNOSIS — H35372 Puckering of macula, left eye: Secondary | ICD-10-CM | POA: Diagnosis not present

## 2024-05-28 ENCOUNTER — Ambulatory Visit (INDEPENDENT_AMBULATORY_CARE_PROVIDER_SITE_OTHER): Admitting: Internal Medicine

## 2024-05-28 ENCOUNTER — Encounter: Payer: Self-pay | Admitting: Internal Medicine

## 2024-05-28 VITALS — BP 116/64 | HR 80 | Temp 98.1°F | Ht 69.0 in | Wt 263.1 lb

## 2024-05-28 DIAGNOSIS — K581 Irritable bowel syndrome with constipation: Secondary | ICD-10-CM | POA: Diagnosis not present

## 2024-05-28 DIAGNOSIS — K5902 Outlet dysfunction constipation: Secondary | ICD-10-CM

## 2024-05-28 DIAGNOSIS — R1032 Left lower quadrant pain: Secondary | ICD-10-CM

## 2024-05-28 DIAGNOSIS — R110 Nausea: Secondary | ICD-10-CM | POA: Diagnosis not present

## 2024-05-28 NOTE — Progress Notes (Signed)
 Referring Provider: Maree Isles, MD Primary Care Physician:  Maree Isles, MD Primary GI:  Dr. Cindie  Chief Complaint  Patient presents with   Follow-up    Wants to discuss issues further with you.    HPI:   Jodi Ray is a 63 y.o. female with a history of anemia, brain aneurysm, CAD s/p stent in 2019, diabetes, chronic pain, HTN, hypothyroidism, OSA on CPAP, and CKD, adenomatous colon polyps due for surveillance in 2028, hemorrhoids, constipation, chronic upper abdominal pain on daily PPI, presenting today for follow-up of constipation, abdominal pain, and nausea.   IBS with constipation: Previously trialed Linzess , Trulance, Motegrity  (too expensive), IBSrella, MiraLAX every morning, Metamucil twice daily. Taking Amitiza  24 mcg BID.   Continues to have bowel movements every 5 days or so.   Also with LLQ abdominal pain chronic, mild to moderate, intermittent.  Relieved with bowel movements.  Continues to have chronic nausea after meals.  History of type 1 diabetes for 50 years.  See further workup below.  Underwent anorectal manometry 01/16/2024 which showed normal internal and external anal sphincter pressures, evidence of dyssynergia defecation, rectal hyposensitivity with prolonged balloon expulsion time.  Recommended pelvic floor therapy for biofeedback.  She went to biofeedback therapy in [place].  Unfortunately had a bad experience with the therapist.  No longer interested in having this done.  Prior GI evaluation: HIDA scan 04/05/2023 unremarkable  Gastric imaging study 03/29/2023 without delayed gastric emptying.  EGD 03/19/2023 with gastritis, biopsies negative for H. Pylori.  CT abdomen pelvis with IV contrast 12/29/2022 unremarkable.  Colonoscopy  04/20/2022 with nonbleeding internal hemorrhoids, 3 mm cecal polyp removed, otherwise normal exam. Recommended 5-year surveillance due to family history of colon cancer. Pathology with tubular adenoma.   EGD April  2018 with moderate gastritis and benign gastric polyp. Also with normal duodenal biopsies.    Colonoscopy in 2018 with evidence of internal hemorrhoids, redundant left colon, and negative random colon biopsies for microscopic colitis.    HIDA with normal gallbladder EF in 2018, but patient reported pain after Ensure ingestion. Initially saw surgeon, Dr. Maranda, about possible cholecystectomy and scheduled surgery, but later cancelled as PCP did not think symptoms gallbladder related.     Abdominal ultrasound in 2018 with hepatic steatosis, mildly distended gallbladder, sonographic Murphy sign.   CT A/P with contrast in 2018 with fairly large amount of stool in the colon, small hiatal hernia, minimal ventral hernia containing fat only.  Past Medical History:  Diagnosis Date   Adult RDS (HCC)    Anemia    Brain aneurysm    frontal lobe   CAD (coronary artery disease)    a. 02/2018: s/p DES to Proximal LAD with residual 20% RCA stenosis.   Chronic pain    Diabetes mellitus without complication (HCC)    Headache    History of left bundle branch block (LBBB)    HTN (hypertension)    Hx of cardiovascular stress test 10/2016   intermediate risk study   Hypothyroidism    Neuromuscular disorder (HCC)    Neuropathy    Obesity    OSA on CPAP 03/10/2015   Renal insufficiency    Retinopathy    Sleep apnea    Varicose veins of both lower extremities     Past Surgical History:  Procedure Laterality Date   ANAL RECTAL MANOMETRY N/A 01/16/2024   Procedure: MANOMETRY, ANORECTAL;  Surgeon: Nandigam, Kavitha V, MD;  Location: [facility];  Service: Gastroenterology;  Laterality: N/A;  BIOPSY  11/14/2016   Procedure: BIOPSY;  Surgeon: Margo LITTIE Haddock, MD;  Location: [facility];  Service: Endoscopy;;  colon gastric duodenal   BIOPSY  03/19/2023   Procedure: BIOPSY;  Surgeon: Cindie Carlin POUR, DO;  Location: [facility];  Service: Endoscopy;;   BREAST EXCISIONAL BIOPSY Right    BREAST  SURGERY Left 1994   Lumpectomy   COLONOSCOPY WITH PROPOFOL  N/A 11/14/2016   Dr. haddock: Moderately redundant rectosigmoid colon. Random colon biopsies benign. Internal hemorrhoids. Surveillance colonoscopy in 5 years.   COLONOSCOPY WITH PROPOFOL  N/A 04/20/2022   Procedure: COLONOSCOPY WITH PROPOFOL ;  Surgeon: Cindie Carlin POUR, DO;  Location: [facility];  Service: Endoscopy;  Laterality: N/A;  12:30am   CORONARY PRESSURE/FFR STUDY N/A 06/19/2023   Procedure: CORONARY PRESSURE/FFR STUDY;  Surgeon: Wendel Lurena POUR, MD;  Location: [facility];  Service: Cardiovascular;  Laterality: N/A;   CORONARY STENT INTERVENTION N/A 03/22/2018   Procedure: CORONARY STENT INTERVENTION;  Surgeon: Verlin Lonni BIRCH, MD;  Location: [facility];  Service: Cardiovascular;  Laterality: N/A;   ESOPHAGOGASTRODUODENOSCOPY (EGD) WITH PROPOFOL  N/A 11/14/2016   Dr. haddock: Esophagus normal. Moderate gastritis, few gastric polyps. Duodenal biopsies negative. Fundic gland polyp gastric polyp area no H pylori.   ESOPHAGOGASTRODUODENOSCOPY (EGD) WITH PROPOFOL  N/A 03/19/2023   Procedure: ESOPHAGOGASTRODUODENOSCOPY (EGD) WITH PROPOFOL ;  Surgeon: Cindie Carlin POUR, DO;  [facility];  Service: Endoscopy;  Laterality: N/A;  10:15 am, asa 3   FOOT SURGERY     LEFT HEART CATH AND CORONARY ANGIOGRAPHY N/A 03/22/2018   Procedure: LEFT HEART CATH AND CORONARY ANGIOGRAPHY;  Surgeon: Verlin Lonni BIRCH, MD;  Location: [facility];  Service: Cardiovascular;  Laterality: N/A;   LEFT HEART CATH AND CORONARY ANGIOGRAPHY N/A 06/19/2023   Procedure: LEFT HEART CATH AND CORONARY ANGIOGRAPHY;  Surgeon: Wendel Lurena POUR, MD;  Location: [facility];  Service: Cardiovascular;  Laterality: N/A;   LEG SURGERY Right    POLYPECTOMY  04/20/2022   Procedure: POLYPECTOMY;  Surgeon: Cindie Carlin POUR, DO;  Location: [facility];  Service: Endoscopy;;   REPLACEMENT TOTAL KNEE Right    SPINAL CORD  STIMULATOR IMPLANT      Current Outpatient Medications  Medication Sig Dispense Refill   aspirin  EC 81 MG tablet Take 1 tablet (81 mg total) by mouth daily. Swallow whole. 90 tablet 3   atorvastatin  (LIPITOR) 80 MG tablet TAKE 1 TABLET BY MOUTH ONCE DAILY AT  6PM 90 tablet 3   calcitRIOL (ROCALTROL) 0.25 MCG capsule Take 0.25 mcg by mouth every Monday, Wednesday, and Friday.     carvedilol  (COREG ) 12.5 MG tablet Take 1 tablet (12.5 mg total) by mouth 2 (two) times daily. 180 tablet 3   Continuous Blood Gluc Sensor (DEXCOM G6 SENSOR) MISC 1 Device by Other route as directed.     EPINEPHRINE  0.3 mg/0.3 mL IJ SOAJ injection INJECT CONTENTS OF ONE PEN INTO THE MUSCLE AS NEEDED FOR ANAPHYLAXIS 2 each 0   hydrALAZINE  (APRESOLINE ) 25 MG tablet Take 1 tablet (25 mg total) by mouth 3 (three) times daily. 270 tablet 3   Insulin  Human (INSULIN  PUMP) SOLN Inject into the skin continuous.      JARDIANCE 10 MG TABS tablet Take 5 mg by mouth every Monday, Wednesday, and Friday.     levothyroxine  (SYNTHROID ) 112 MCG tablet Take 112 mcg by mouth daily before breakfast.     lubiprostone  (AMITIZA ) 24 MCG capsule Take 1 capsule (24 mcg total)  by mouth 2 (two) times daily with a meal. 60 capsule 11   metFORMIN  (GLUCOPHAGE ) 500 MG tablet Take 1 tablet (500 mg total) by mouth 2 (two) times daily with a meal.     nitroGLYCERIN  (NITROSTAT ) 0.4 MG SL tablet Place 1 tablet (0.4 mg total) under the tongue every 5 (five) minutes as needed for chest pain. 25 tablet 3   NOVOLOG  100 UNIT/ML injection SMARTSIG:100 Unit(s) SUB-Q Daily     pantoprazole  (PROTONIX ) 40 MG tablet TAKE 1 TABLET BY MOUTH TWICE DAILY BEFORE A MEAL 60 tablet 3   spironolactone (ALDACTONE) 25 MG tablet Take 12.5 mg by mouth daily.     torsemide (DEMADEX) 100 MG tablet Take 100 mg by mouth daily.     verapamil  (CALAN ) 80 MG tablet Take 1 tablet (80 mg total) by mouth 3 (three) times daily. 90 tablet 5   No current facility-administered medications for  this visit.    Allergies as of 05/28/2024 - Review Complete 05/28/2024  Allergen Reaction Noted   Other Nausea And Vomiting and Other (See Comments) 08/04/2015   Fentanyl Nausea And Vomiting and Other (See Comments) 11/09/2016    Family History  Problem Relation Age of Onset   Cancer Mother 59       colon   Cancer Father        renal cell   Colon cancer Father    Breast cancer Sister    Cancer Sister        primary brain, and primary breast too   Breast cancer Sister    Cancer Sister        breast    Breast cancer Sister    Breast cancer Sister    Cancer Brother        kidney and liver   Sleep apnea Neg Hx     Social History   Socioeconomic History   Marital status: Widowed    Spouse name: Not on file   Number of children: 0   Years of education: Not on file   Highest education level: Bachelor's degree (e.g., BA, AB, BS)  Occupational History   Occupation: Retired  Tobacco Use   Smoking status: Never   Smokeless tobacco: Never  Vaping Use   Vaping status: Never Used  Substance and Sexual Activity   Alcohol  use: No    Alcohol /week: 0.0 standard drinks of alcohol    Drug use: No   Sexual activity: Not Currently    Partners: Male  Other Topics Concern   Not on file  Social History Narrative   Drinks about 1 cup coffee day.   Cardiac rehab 3x week   One level home with significant other   Bachelors in Accounting      Right handed   No children   widow   Social Drivers of Corporate Investment Banker Strain: Low Risk  (11/20/2023)   Received [facility]   Overall Financial Resource Strain (CARDIA)    Difficulty of Paying Living Expenses: Not hard at all  Food Insecurity: No Food Insecurity (11/20/2023)   [facility]   Hunger Vital Sign    Within the past 12 months, you worried that your food would run out before you got the money to buy more.: Never true    Within the past 12 months, the food you bought just didn't last and you  didn't have money to get more.: Never true  Transportation Needs: No Transportation Needs (11/20/2023)   Received  from [facility]   PRAPARE - Transportation    Lack of Transportation (Medical): No    Lack of Transportation (Non-Medical): No  Physical Activity: Not on file  Stress: Not on file  Social Connections: Not on file    Subjective: Review of Systems  Constitutional:  Negative for chills and fever.  HENT:  Negative for congestion and hearing loss.   Eyes:  Negative for blurred vision and double vision.  Respiratory:  Negative for cough and shortness of breath.   Cardiovascular:  Negative for chest pain and palpitations.  Gastrointestinal:  Positive for abdominal pain, constipation and nausea. Negative for blood in stool, diarrhea, heartburn, melena and vomiting.  Genitourinary:  Negative for dysuria and urgency.  Musculoskeletal:  Negative for joint pain and myalgias.  Skin:  Negative for itching and rash.  Neurological:  Negative for dizziness and headaches.  Psychiatric/Behavioral:  Negative for depression. The patient is not nervous/anxious.      Objective: BP 116/64 (BP Location: Right Arm, Patient Position: Sitting, Cuff Size: Large)   Pulse 80   Temp 98.1 F (36.7 C) (Oral)   Ht 5' 9 (1.753 m)   Wt 263 lb 1.6 oz (119.3 kg)   SpO2 93%   BMI 38.85 kg/m  Physical Exam Constitutional:      Appearance: Normal appearance.  HENT:     Head: Normocephalic and atraumatic.  Eyes:     Extraocular Movements: Extraocular movements intact.     Conjunctiva/sclera: Conjunctivae normal.  Cardiovascular:     Rate and Rhythm: Normal rate and regular rhythm.  Pulmonary:     Effort: Pulmonary effort is normal.     Breath sounds: Normal breath sounds.  Abdominal:     General: Bowel sounds are normal.     Palpations: Abdomen is soft.  Musculoskeletal:        General: No swelling. Normal range of motion.     Cervical back: Normal range of motion and neck supple.   Skin:    General: Skin is warm and dry.     Coloration: Skin is not jaundiced.  Neurological:     General: No focal deficit present.     Mental Status: She is alert and oriented to person, place, and time.  Psychiatric:        Mood and Affect: Mood normal.        Behavior: Behavior normal.      Assessment/Plan:  1.  Irritable bowel syndrome with constipation, dyssynergic defecation-has trialed Linzess , Trulance, IBSrella, lactulose , suppositories, symptoms seem to be most improved on Amitiza  24 mcg twice daily.   Has taken MiraLAX on top of this as well as fiber therapy but still only averaging bowel movements every 5 days with difficult defecation and hard stools as well as associated abdominal pain.  Underwent anorectal manometry 01/16/2024 which showed normal internal and external anal sphincter pressures, evidence of dyssynergia defecation, rectal hyposensitivity with prolonged balloon expulsion time.   She went to biofeedback therapy in [place].  Unfortunately had a bad experience with the therapist.  No longer interested in having this done.  Continue on Amitiza  twice daily.  Increase MiraLAX to 1 capful twice daily.  Stop Metamucil, use Benefiber.  Continue adequate water hydration.  If not improved she can add on once daily Dulcolax.   2.  Nausea-chronic, see HPI for full workup.  Given her history of type 1 diabetes, she may have gastroparesis despite normal GES.  Consider trial of Reglan pending on clinical course.  3.  Left lower quadrant abdominal pain-CT abdomen pelvis reassuring.  Likely due to irritable bowel syndrome.  Hopefully this improves as we get her bowels moving more effectively.  Follow up in 3 months   05/28/2024 9:57 AM   Disclaimer: This note was dictated with voice recognition software. Similar sounding words can inadvertently be transcribed and may not be corrected upon review.

## 2024-05-28 NOTE — Patient Instructions (Signed)
 Recommend you continue on Amitiza  twice daily.  Stop Metamucil.  Continue Benefiber. Recommend increasing MiraLAX to 1 capful twice daily.  If no improvement then would add on once daily Dulcolax.  Continue to drink enough water throughout the day.  Follow-up in 3 months.  It is always a pleasure seeing you.  Dr. Cindie

## 2024-06-03 ENCOUNTER — Encounter: Admitting: Physical Therapy

## 2024-06-04 ENCOUNTER — Ambulatory Visit (INDEPENDENT_AMBULATORY_CARE_PROVIDER_SITE_OTHER): Payer: Self-pay

## 2024-06-04 DIAGNOSIS — J309 Allergic rhinitis, unspecified: Secondary | ICD-10-CM | POA: Diagnosis not present

## 2024-06-09 DIAGNOSIS — E1065 Type 1 diabetes mellitus with hyperglycemia: Secondary | ICD-10-CM | POA: Diagnosis not present

## 2024-06-18 NOTE — Progress Notes (Signed)
 [facility] - Clinic Note  06/19/2024     CHIEF COMPLAINT Patient presents for Retina Follow Up   HISTORY OF PRESENT ILLNESS: Jodi Ray is a 63 y.o. female who presents to the clinic today for:   HPI     Retina Follow Up   Patient presents with  Diabetic Retinopathy.  In both eyes.  This started 8 weeks ago.  I, the attending physician,  performed the HPI with the patient and updated documentation appropriately.        Comments   Patient states she is having cataract surgery w/ Dr. Lavonia 12.10.25 OD and 01.14.25 OS. Her blood sugar was 85.      Last edited by Valdemar Rogue, MD on 06/19/2024 11:37 AM.    Pt states she's having cataract sx 12.10.25 then 01.14.25  Referring physician: Maree Isles, MD [address] [place],  [place] [address]  HISTORICAL INFORMATION:   Selected notes from the MEDICAL RECORD NUMBER Referred by Dr. Vivian for concern of DME LEE:  Ocular Hx- PMH-    CURRENT MEDICATIONS: No current outpatient medications on file. (Ophthalmic Drugs)   No current facility-administered medications for this visit. (Ophthalmic Drugs)   Current Outpatient Medications (Other)  Medication Sig   aspirin  EC 81 MG tablet Take 1 tablet (81 mg total) by mouth daily. Swallow whole.   atorvastatin  (LIPITOR) 80 MG tablet TAKE 1 TABLET BY MOUTH ONCE DAILY AT  6PM   calcitRIOL (ROCALTROL) 0.25 MCG capsule Take 0.25 mcg by mouth every Monday, Wednesday, and Friday.   carvedilol  (COREG ) 12.5 MG tablet Take 1 tablet (12.5 mg total) by mouth 2 (two) times daily.   Continuous Blood Gluc Sensor (DEXCOM G6 SENSOR) MISC 1 Device by Other route as directed.   EPINEPHRINE  0.3 mg/0.3 mL IJ SOAJ injection INJECT CONTENTS OF ONE PEN INTO THE MUSCLE AS NEEDED FOR ANAPHYLAXIS   hydrALAZINE  (APRESOLINE ) 25 MG tablet Take 1 tablet (25 mg total) by mouth 3 (three) times daily.   Insulin  Human (INSULIN  PUMP) SOLN Inject into the skin continuous.    JARDIANCE 10 MG  TABS tablet Take 5 mg by mouth every Monday, Wednesday, and Friday.   levothyroxine  (SYNTHROID ) 112 MCG tablet Take 112 mcg by mouth daily before breakfast.   lubiprostone  (AMITIZA ) 24 MCG capsule Take 1 capsule (24 mcg total) by mouth 2 (two) times daily with a meal.   metFORMIN  (GLUCOPHAGE ) 500 MG tablet Take 1 tablet (500 mg total) by mouth 2 (two) times daily with a meal.   nitroGLYCERIN  (NITROSTAT ) 0.4 MG SL tablet Place 1 tablet (0.4 mg total) under the tongue every 5 (five) minutes as needed for chest pain.   NOVOLOG  100 UNIT/ML injection SMARTSIG:100 Unit(s) SUB-Q Daily   pantoprazole  (PROTONIX ) 40 MG tablet TAKE 1 TABLET BY MOUTH TWICE DAILY BEFORE A MEAL   spironolactone (ALDACTONE) 25 MG tablet Take 12.5 mg by mouth daily.   torsemide (DEMADEX) 100 MG tablet Take 100 mg by mouth daily.   verapamil  (CALAN ) 80 MG tablet Take 1 tablet (80 mg total) by mouth 3 (three) times daily.   No current facility-administered medications for this visit. (Other)   REVIEW OF SYSTEMS: ROS   Positive for: Endocrine, Cardiovascular, Eyes Negative for: Constitutional, Gastrointestinal, Neurological, Skin, Genitourinary, Musculoskeletal, HENT, Respiratory, Psychiatric, Allergic/Imm, Heme/Lymph Last edited by Myra Wanda SAILOR, COT on 06/19/2024  7:56 AM.       ALLERGIES Allergies  Allergen Reactions   Other Nausea And Vomiting and Other (See  Comments)    Opiates Alters mental status diarrhea   Fentanyl Nausea And Vomiting and Other (See Comments)    Nausea, vomiting, loss of consciousness, requiring reversal   PAST MEDICAL HISTORY Past Medical History:  Diagnosis Date   Adult RDS (HCC)    Anemia    Brain aneurysm    frontal lobe   CAD (coronary artery disease)    a. 02/2018: s/p DES to Proximal LAD with residual 20% RCA stenosis.   Chronic pain    Diabetes mellitus without complication (HCC)    Headache    History of left bundle branch block (LBBB)    HTN (hypertension)    Hx of  cardiovascular stress test 10/2016   intermediate risk study   Hypothyroidism    Neuromuscular disorder (HCC)    Neuropathy    Obesity    OSA on CPAP 03/10/2015   Renal insufficiency    Retinopathy    Sleep apnea    Varicose veins of both lower extremities    Past Surgical History:  Procedure Laterality Date   ANAL RECTAL MANOMETRY N/A 01/16/2024   Procedure: MANOMETRY, ANORECTAL;  Surgeon: Nandigam, Kavitha V, MD;  Location: [facility];  Service: Gastroenterology;  Laterality: N/A;   BIOPSY  11/14/2016   Procedure: BIOPSY;  Surgeon: Margo LITTIE Haddock, MD;  Location: [facility];  Service: Endoscopy;;  colon gastric duodenal   BIOPSY  03/19/2023   Procedure: BIOPSY;  Surgeon: Cindie Carlin POUR, DO;  Location: [facility];  Service: Endoscopy;;   BREAST EXCISIONAL BIOPSY Right    BREAST SURGERY Left 1994   Lumpectomy   COLONOSCOPY WITH PROPOFOL  N/A 11/14/2016   Dr. haddock: Moderately redundant rectosigmoid colon. Random colon biopsies benign. Internal hemorrhoids. Surveillance colonoscopy in 5 years.   COLONOSCOPY WITH PROPOFOL  N/A 04/20/2022   Procedure: COLONOSCOPY WITH PROPOFOL ;  Surgeon: Cindie Carlin POUR, DO;  Location: [facility];  Service: Endoscopy;  Laterality: N/A;  12:30am   CORONARY PRESSURE/FFR STUDY N/A 06/19/2023   Procedure: CORONARY PRESSURE/FFR STUDY;  Surgeon: Wendel Lurena POUR, MD;  Location: [facility];  Service: Cardiovascular;  Laterality: N/A;   CORONARY STENT INTERVENTION N/A 03/22/2018   Procedure: CORONARY STENT INTERVENTION;  Surgeon: Verlin Lonni BIRCH, MD;  Location: [facility];  Service: Cardiovascular;  Laterality: N/A;   ESOPHAGOGASTRODUODENOSCOPY (EGD) WITH PROPOFOL  N/A 11/14/2016   Dr. haddock: Esophagus normal. Moderate gastritis, few gastric polyps. Duodenal biopsies negative. Fundic gland polyp gastric polyp area no H pylori.   ESOPHAGOGASTRODUODENOSCOPY (EGD) WITH PROPOFOL  N/A 03/19/2023   Procedure:  ESOPHAGOGASTRODUODENOSCOPY (EGD) WITH PROPOFOL ;  Surgeon: Cindie Carlin POUR, DO;  [facility];  Service: Endoscopy;  Laterality: N/A;  10:15 am, asa 3   FOOT SURGERY     LEFT HEART CATH AND CORONARY ANGIOGRAPHY N/A 03/22/2018   Procedure: LEFT HEART CATH AND CORONARY ANGIOGRAPHY;  Surgeon: Verlin Lonni BIRCH, MD;  Location: [facility];  Service: Cardiovascular;  Laterality: N/A;   LEFT HEART CATH AND CORONARY ANGIOGRAPHY N/A 06/19/2023   Procedure: LEFT HEART CATH AND CORONARY ANGIOGRAPHY;  Surgeon: Wendel Lurena POUR, MD;  Location: [facility];  Service: Cardiovascular;  Laterality: N/A;   LEG SURGERY Right    POLYPECTOMY  04/20/2022   Procedure: POLYPECTOMY;  Surgeon: Cindie Carlin POUR, DO;  Location: [facility];  Service: Endoscopy;;   REPLACEMENT TOTAL KNEE Right    SPINAL CORD STIMULATOR IMPLANT     FAMILY HISTORY Family History  Problem Relation Age of  Onset   Cancer Mother 71       colon   Cancer Father        renal cell   Colon cancer Father    Breast cancer Sister    Cancer Sister        primary brain, and primary breast too   Breast cancer Sister    Cancer Sister        breast    Breast cancer Sister    Breast cancer Sister    Cancer Brother        kidney and liver   Sleep apnea Neg Hx    SOCIAL HISTORY Social History   Tobacco Use   Smoking status: Never   Smokeless tobacco: Never  Vaping Use   Vaping status: Never Used  Substance Use Topics   Alcohol  use: No    Alcohol /week: 0.0 standard drinks of alcohol    Drug use: No       OPHTHALMIC EXAM:  Base Eye Exam     Visual Acuity (Snellen - Linear)       Right Left   Dist cc 20/40 20/25   Dist ph cc 20/25 NI    Correction: Glasses         Tonometry (Tonopen, 7:59 AM)       Right Left   Pressure 17 19         Pupils       Dark Light Shape React APD   Right 3 2 Round Brisk None   Left 3 2 Round Brisk None         Visual Fields       Left Right     Full Full         Extraocular Movement       Right Left    Full, Ortho Full, Ortho         Neuro/Psych     Oriented x3: Yes   Mood/Affect: Normal         Dilation     Both eyes: 1.0% Mydriacyl, 2.5% Phenylephrine  @ 7:56 AM           Slit Lamp and Fundus Exam     Slit Lamp Exam       Right Left   Lids/Lashes Dermatochalasis - upper lid, mild MGD Dermatochalasis - upper lid, mild MGD   Conjunctiva/Sclera nasal pingeucula nasal pingeucula, Trace Injection   Cornea arcus, mild tear film debris arcus, 1+ fine Punctate epithelial erosions   Anterior Chamber deep and clear, no cell or flare deep and clear, no cell or flare   Iris Round and dilated, No NVI Round and dilated, No NVI   Lens 2-3+ Nuclear sclerosis, 2-3+ Cortical cataract, 1+ Posterior subcapsular cataract 2-3+ Nuclear sclerosis, 2-3+ Cortical cataract, 1+ Posterior subcapsular cataract   Anterior Vitreous Vitreous syneresis Vitreous syneresis         Fundus Exam       Right Left   Disc Pink and Sharp, no NVD, temporal PPA Pink and Sharp, no NVD, temporal PPP   C/D Ratio 0.4 0.4   Macula Good foveal reflex, +cystic changes -- slightly improved, scattered MA -- improved Flat, good foveal reflex, scattered MA -- improved, trace cystic changes -- stably improved   Vessels attenuated, Tortuous attenuated, Tortuous, +NV -- regressing   Periphery Attached, dense 360 PRP extending posteriorly to arcades Attached, 360 PRP with good posterior fill in           Refraction  Wearing Rx       Sphere Cylinder Axis Add   Right +1.75 +1.50 124 +2.50   Left +4.25 +2.25 001 +2.50           IMAGING AND PROCEDURES  Imaging and Procedures for 06/19/2024  OCT, Retina - OU - Both Eyes       Right Eye Quality was good. Central Foveal Thickness: 332. Progression has been stable. Findings include normal foveal contour, no IRF, no SRF (Stable improvement in cystic changes centrally and inferior  macula--just trace cystic changes remain. ).   Left Eye Quality was good. Central Foveal Thickness: 323. Progression has been stable. Findings include normal foveal contour, no SRF, epiretinal membrane, intraretinal fluid, macular pucker (trace cystic changes perifovea; mild ERM and pucker nasal macula).   Notes *Images captured and stored on drive  Diagnosis / Impression:  +DME OU OD: Stable improvement in cystic changes centrally and inferior macula--just trace cystic changes remain.  OS: stable improvement in cystic changes perifovea -- stably resolved; mild ERM and pucker nasal macula  Clinical management:  See below  Abbreviations: NFP - Normal foveal profile. CME - cystoid macular edema. PED - pigment epithelial detachment. IRF - intraretinal fluid. SRF - subretinal fluid. EZ - ellipsoid zone. ERM - epiretinal membrane. ORA - outer retinal atrophy. ORT - outer retinal tubulation. SRHM - subretinal hyper-reflective material. IRHM - intraretinal hyper-reflective material      Fluorescein  Angiography Optos (Transit OD)       Right Eye Progression has improved. Early phase findings include delayed filling, staining, microaneurysm (Cluster of leakage temporal macula--improved, now just staining). Mid/Late phase findings include leakage, staining, microaneurysm (No NV).   Left Eye Progression has improved. Early phase findings include staining, microaneurysm, retinal neovascularization, vascular perfusion defect (Interval improvement in scattered focal NVE temporal hemisphere--decreased leakage ). Mid/Late phase findings include leakage, staining, microaneurysm, retinal neovascularization, vascular perfusion defect (Interval improvement in scattered focal NVE temporal hemisphere--decreased leakage ).   Notes **Images stored on drive**  Impression: History of PDR OU s/p PRP 360 OU OD: Cluster of leakage temporal macula--improved, now just staining, no NV OS: Interval improvement in  scattered focal NVE temporal hemisphere--decreased leakage       Intravitreal Injection, Pharmacologic Agent - OD - Right Eye       Time Out 06/19/2024. 9:29 AM. Confirmed correct patient, procedure, site, and patient consented.   Anesthesia Topical anesthesia was used. Anesthetic medications included Lidocaine  2%, Proparacaine 0.5%.   Procedure Preparation included 5% betadine to ocular surface, eyelid speculum. A (32g) needle was used.   Injection: 2 mg aflibercept  2 MG/0.05ML   Route: Intravitreal, Site: Right Eye   NDC: Q956576, Lot: [id number], Expiration date: 06/29/2025, Waste: 0 mL   Post-op Post injection exam found visual acuity of at least counting fingers. The patient tolerated the procedure well. There were no complications. The patient received written and verbal post procedure care education.              ASSESSMENT/PLAN:    ICD-10-CM   1. Proliferative diabetic retinopathy of both eyes with macular edema associated with type 2 diabetes mellitus (HCC)  E11.3513 OCT, Retina - OU - Both Eyes    Fluorescein  Angiography Optos (Transit OD)    Intravitreal Injection, Pharmacologic Agent - OD - Right Eye    aflibercept  (EYLEA ) SOLN 2 mg    2. Long-term (current) use of injectable non-insulin  antidiabetic drugs  Z79.85     3. Long term (current) use of oral  hypoglycemic drugs  Z79.84     4. Current use of insulin  (HCC)  Z79.4     5. Essential hypertension  I10     6. Hypertensive retinopathy of both eyes  H35.033     7. Combined forms of age-related cataract of both eyes  H25.813      1-4. Proliferative diabetic retinopathy OU - previously managed by Dr. Waylan -- lost to f/u since 2020 - s/p PRP OU and IVA OS x7 (BB) - s/p IVA OD #1 (01.02.24), #2 (02.02.24), #3 (03.01.24), #4(04.05.24), #5 (10.21.24) -- IVA resistance - s/p IVA OS #1 (01.04.24), #2 (02.02.24), #3 (03.01.24), #4(04.05.24), #5 (05.03.24), #6 (06.03.24), #7 (07.01.24), #8 (08.05.24),  #9 (09.09.24) - IVA resistance ================================= - s/p IVE OD #1 (05.03.24), #2 (06.03.24), #3 (07.01.24), #4 (08.05.24), #5 (09.09.24), #6 (12.10.24), #7 (01.13.25), #8 (02.24.25), #9 (04.07.25), #10 (06.02.25), #11 (07.29.25), #12 (09.17.25) - s/p IVE OS #1 (10.21.24), #2 (12.10.24), #3 (04.07.25) - s/p PRP fill in OS (01.23.24) - exam shows scattered MA OU; laser PRP OU - FA (11.20.25) shows OD: Cluster of leakage temporal macula--improved, now just staining, no NV; OS: Interval improvement in scattered focal NVE temporal hemisphere--decreased leakage  - FA (01.02.23) shows OD: Cluster of leakage temporal macula, no NV; OS: Scattered focal NVE greatest inferior and temporal quads -- will need PRP fill in OS -- completed Jan 2024 - BCVA OD 20/30 from 20/25; OS 20/25 - stable - OCT shows OD: Stable improvement in cystic changes centrally and inferior macula--just trace cystic changes remain; OS: stable improvement in cystic changes perifovea -- stably resolved; mild ERM and pucker nasal macula at 8 weeks - recommend IVE OD #13 today, 11.20.25 w/ f/u in 7 wks (pt having cataract sx Jan) - treating OS PRN -- will hold tx in OS today -- pt in agreement - pt wishes to proceed with IVE OD - RBA of procedure discussed, questions answered - see procedure note - IVE informed consent obtained and signed, 05.03.24 (OU) - f/u 7 weeks -- DFE, OCT, possible injxns  5,6. Hypertensive retinopathy OU - discussed importance of tight BP control  - monitor  7. Mixed Cataract OU - The symptoms of cataract, surgical options, and treatments and risks were discussed with patient. - discussed diagnosis and progression - Pt scheduled for cataract sx w/ Dr. Lavonia- OD 12.10.25 then OS  01.14.25  Ophthalmic Meds Ordered this visit:  Meds ordered this encounter  Medications   aflibercept  (EYLEA ) SOLN 2 mg     Return in about 7 weeks (around 08/07/2024) for PDR OU, DFE, OCT.  There are no Patient  Instructions on file for this visit.  This document serves as a record of services personally performed by Redell JUDITHANN Hans, MD, PhD. It was created on their behalf by Auston Muzzy, COMT. The creation of this record is the provider's dictation and/or activities during the visit.  Electronically signed by: Auston Muzzy, COMT 06/22/24 8:13 PM  This document serves as a record of services personally performed by Redell JUDITHANN Hans, MD, PhD. It was created on their behalf by Almetta Pesa, an ophthalmic technician. The creation of this record is the provider's dictation and/or activities during the visit.    Electronically signed by: Almetta Pesa, OA, 06/22/24  8:13 PM  Redell JUDITHANN Hans, M.D., Ph.D. Diseases & Surgery of the Retina and Vitreous [facility]  I have reviewed the above documentation for accuracy and completeness, and I agree with the above. Redell  JUDITHANN Hans, M.D., Ph.D. 06/22/24 8:14 PM   Abbreviations: M myopia (nearsighted); A astigmatism; H hyperopia (farsighted); P presbyopia; Mrx spectacle prescription;  CTL contact lenses; OD right eye; OS left eye; OU both eyes  XT exotropia; ET esotropia; PEK punctate epithelial keratitis; PEE punctate epithelial erosions; DES dry eye syndrome; MGD meibomian gland dysfunction; ATs artificial tears; PFAT's preservative free artificial tears; NSC nuclear sclerotic cataract; PSC posterior subcapsular cataract; ERM epi-retinal membrane; PVD posterior vitreous detachment; RD retinal detachment; DM diabetes mellitus; DR diabetic retinopathy; NPDR non-proliferative diabetic retinopathy; PDR proliferative diabetic retinopathy; CSME clinically significant macular edema; DME diabetic macular edema; dbh dot blot hemorrhages; CWS cotton wool spot; POAG primary open angle glaucoma; C/D cup-to-disc ratio; HVF humphrey visual field; GVF goldmann visual field; OCT optical coherence tomography; IOP intraocular pressure; BRVO Branch retinal  vein occlusion; CRVO central retinal vein occlusion; CRAO central retinal artery occlusion; BRAO branch retinal artery occlusion; RT retinal tear; SB scleral buckle; PPV pars plana vitrectomy; VH Vitreous hemorrhage; PRP panretinal laser photocoagulation; IVK intravitreal kenalog ; VMT vitreomacular traction; MH Macular hole;  NVD neovascularization of the disc; NVE neovascularization elsewhere; AREDS age related eye disease study; ARMD age related macular degeneration; POAG primary open angle glaucoma; EBMD epithelial/anterior basement membrane dystrophy; ACIOL anterior chamber intraocular lens; IOL intraocular lens; PCIOL posterior chamber intraocular lens; Phaco/IOL phacoemulsification with intraocular lens placement; PRK photorefractive keratectomy; LASIK laser assisted in situ keratomileusis; HTN hypertension; DM diabetes mellitus; COPD chronic obstructive pulmonary disease

## 2024-06-19 ENCOUNTER — Encounter (INDEPENDENT_AMBULATORY_CARE_PROVIDER_SITE_OTHER): Payer: Self-pay | Admitting: Ophthalmology

## 2024-06-19 ENCOUNTER — Ambulatory Visit (INDEPENDENT_AMBULATORY_CARE_PROVIDER_SITE_OTHER): Admitting: Ophthalmology

## 2024-06-19 DIAGNOSIS — Z794 Long term (current) use of insulin: Secondary | ICD-10-CM

## 2024-06-19 DIAGNOSIS — H25813 Combined forms of age-related cataract, bilateral: Secondary | ICD-10-CM

## 2024-06-19 DIAGNOSIS — Z7984 Long term (current) use of oral hypoglycemic drugs: Secondary | ICD-10-CM

## 2024-06-19 DIAGNOSIS — E113513 Type 2 diabetes mellitus with proliferative diabetic retinopathy with macular edema, bilateral: Secondary | ICD-10-CM | POA: Diagnosis not present

## 2024-06-19 DIAGNOSIS — I10 Essential (primary) hypertension: Secondary | ICD-10-CM | POA: Diagnosis not present

## 2024-06-19 DIAGNOSIS — H35033 Hypertensive retinopathy, bilateral: Secondary | ICD-10-CM

## 2024-06-19 DIAGNOSIS — Z7985 Long-term (current) use of injectable non-insulin antidiabetic drugs: Secondary | ICD-10-CM | POA: Diagnosis not present

## 2024-06-19 MED ORDER — AFLIBERCEPT 2MG/0.05ML IZ SOLN FOR KALEIDOSCOPE
2.0000 mg | INTRAVITREAL | Status: AC | PRN
  Administered 2024-06-19: 2 mg via INTRAVITREAL

## 2024-06-20 ENCOUNTER — Telehealth (HOSPITAL_BASED_OUTPATIENT_CLINIC_OR_DEPARTMENT_OTHER): Payer: Self-pay

## 2024-06-20 NOTE — Telephone Encounter (Signed)
   Pre-operative Risk Assessment    Patient Name: Jodi Ray  DOB: [birth year] MRN: [id number]   Date of last office visit: 06/1/0/25 with Laymon Qua, PA-C Date of next office visit: 07/17/2024 with Dr. Dorn Ross  Request for Surgical Clearance    Procedure:  Cataract extraction w/intraocular Lens Implantation of the RIGHT eye, followed by the LEFT eye  Date of Surgery:  Clearance 07/09/24-08/13/24                                 Surgeon:  Dr. Evalene Ricker Surgeon's Group or Practice Name:  [facility]  Phone number:  [phone] Fax number:  [phone]   Type of Clearance Requested:   - Medical  - Pharmacy:  Hold Aspirin  -Does NOT need to stop any medications   Type of Anesthesia:    Topical anesthesia w/IV medication will be used   Additional requests/questions:  None  SignedPatrcia Iverson CROME   06/20/2024, 11:39 AM

## 2024-06-20 NOTE — Telephone Encounter (Signed)
   Patient Name: Jodi Ray  DOB: Sep 08, 1960 MRN: [id number]  Primary Cardiologist: Alvan Carrier, MD  Chart reviewed as part of pre-operative protocol coverage. Cataract extractions are recognized in guidelines as low risk surgeries that do not typically require specific preoperative testing or holding of blood thinner therapy. Therefore, given past medical history and time since last visit, based on ACC/AHA guidelines, Jodi Ray would be at acceptable risk for the planned procedure on 07/09/2024 and 08/13/2024 without further cardiovascular testing.   I will route this recommendation to the requesting party via Epic fax function and remove from pre-op pool.  Please call with questions.  Lamarr Satterfield, NP 06/20/2024, 11:49 AM

## 2024-07-02 ENCOUNTER — Ambulatory Visit

## 2024-07-02 DIAGNOSIS — J302 Other seasonal allergic rhinitis: Secondary | ICD-10-CM

## 2024-07-02 DIAGNOSIS — J309 Allergic rhinitis, unspecified: Secondary | ICD-10-CM

## 2024-07-03 ENCOUNTER — Encounter: Admitting: Physical Therapy

## 2024-07-03 DIAGNOSIS — E1151 Type 2 diabetes mellitus with diabetic peripheral angiopathy without gangrene: Secondary | ICD-10-CM | POA: Diagnosis not present

## 2024-07-03 DIAGNOSIS — E1140 Type 2 diabetes mellitus with diabetic neuropathy, unspecified: Secondary | ICD-10-CM | POA: Diagnosis not present

## 2024-07-09 DIAGNOSIS — H2511 Age-related nuclear cataract, right eye: Secondary | ICD-10-CM | POA: Diagnosis not present

## 2024-07-09 DIAGNOSIS — H5371 Glare sensitivity: Secondary | ICD-10-CM | POA: Diagnosis not present

## 2024-07-10 ENCOUNTER — Encounter: Admitting: Physical Therapy

## 2024-07-10 DIAGNOSIS — H2512 Age-related nuclear cataract, left eye: Secondary | ICD-10-CM | POA: Diagnosis not present

## 2024-07-11 ENCOUNTER — Ambulatory Visit (INDEPENDENT_AMBULATORY_CARE_PROVIDER_SITE_OTHER): Admitting: Otolaryngology

## 2024-07-17 ENCOUNTER — Ambulatory Visit: Admitting: Cardiology

## 2024-07-17 ENCOUNTER — Encounter: Admitting: Physical Therapy

## 2024-07-21 ENCOUNTER — Telehealth: Payer: Self-pay | Admitting: Adult Health

## 2024-07-21 NOTE — Telephone Encounter (Signed)
 Change VV to office due to Medicare not covering.

## 2024-07-28 NOTE — Progress Notes (Signed)
 " [facility] - Clinic Note  08/07/2024     CHIEF COMPLAINT Patient presents for Retina Follow Up   HISTORY OF PRESENT ILLNESS: Jodi Ray is a 63 y.o. female who presents to the clinic today for:   HPI     Retina Follow Up   Patient presents with  Diabetic Retinopathy.  In both eyes.  This started 7 weeks ago.  I, the attending physician,  performed the HPI with the patient and updated documentation appropriately.        Comments   Patient states that the vision in the right eye has improved after cataract sx on 12.10.25. She is having the left eye done 01.14.26. She is using s/p IOL sx drops.       Last edited by Valdemar Rogue, MD on 08/07/2024  8:54 AM.     Pt states   Referring physician: Maree Isles, MD [address] [place],  [place] [address]  HISTORICAL INFORMATION:   Selected notes from the MEDICAL RECORD NUMBER Referred by Dr. Vivian for concern of DME LEE:  Ocular Hx- PMH-    CURRENT MEDICATIONS: No current outpatient medications on file. (Ophthalmic Drugs)   No current facility-administered medications for this visit. (Ophthalmic Drugs)   Current Outpatient Medications (Other)  Medication Sig   aspirin  EC 81 MG tablet Take 1 tablet (81 mg total) by mouth daily. Swallow whole.   atorvastatin  (LIPITOR) 80 MG tablet TAKE 1 TABLET BY MOUTH ONCE DAILY AT  6PM   calcitRIOL (ROCALTROL) 0.25 MCG capsule Take 0.25 mcg by mouth every Monday, Wednesday, and Friday.   carvedilol  (COREG ) 12.5 MG tablet Take 1 tablet (12.5 mg total) by mouth 2 (two) times daily.   Continuous Blood Gluc Sensor (DEXCOM G6 SENSOR) MISC 1 Device by Other route as directed.   EPINEPHRINE  0.3 mg/0.3 mL IJ SOAJ injection INJECT CONTENTS OF ONE PEN INTO THE MUSCLE AS NEEDED FOR ANAPHYLAXIS   hydrALAZINE  (APRESOLINE ) 25 MG tablet Take 1 tablet (25 mg total) by mouth 3 (three) times daily.   Insulin  Human (INSULIN  PUMP) SOLN Inject into the skin continuous.    JARDIANCE 10  MG TABS tablet Take 5 mg by mouth every Monday, Wednesday, and Friday.   levothyroxine  (SYNTHROID ) 112 MCG tablet Take 112 mcg by mouth daily before breakfast.   metFORMIN  (GLUCOPHAGE ) 500 MG tablet Take 1 tablet (500 mg total) by mouth 2 (two) times daily with a meal.   nitroGLYCERIN  (NITROSTAT ) 0.4 MG SL tablet Place 1 tablet (0.4 mg total) under the tongue every 5 (five) minutes as needed for chest pain.   NOVOLOG  100 UNIT/ML injection SMARTSIG:100 Unit(s) SUB-Q Daily   pantoprazole  (PROTONIX ) 40 MG tablet TAKE 1 TABLET BY MOUTH TWICE DAILY BEFORE A MEAL   spironolactone (ALDACTONE) 25 MG tablet Take 12.5 mg by mouth daily.   torsemide (DEMADEX) 100 MG tablet Take 100 mg by mouth daily.   verapamil  (CALAN ) 80 MG tablet Take 1 tablet (80 mg total) by mouth 3 (three) times daily.   No current facility-administered medications for this visit. (Other)   REVIEW OF SYSTEMS: ROS   Positive for: Endocrine, Cardiovascular, Eyes Negative for: Constitutional, Gastrointestinal, Neurological, Skin, Genitourinary, Musculoskeletal, HENT, Respiratory, Psychiatric, Allergic/Imm, Heme/Lymph Last edited by Myra Wanda SAILOR, COT on 08/07/2024  7:40 AM.        ALLERGIES Allergies  Allergen Reactions   Other Nausea And Vomiting and Other (See Comments)    Opiates Alters mental status diarrhea   Fentanyl  Nausea And Vomiting and Other (See Comments)    Nausea, vomiting, loss of consciousness, requiring reversal   PAST MEDICAL HISTORY Past Medical History:  Diagnosis Date   Adult RDS (HCC)    Anemia    Brain aneurysm    frontal lobe   CAD (coronary artery disease)    a. 02/2018: s/p DES to Proximal LAD with residual 20% RCA stenosis.   Chronic pain    Diabetes mellitus without complication (HCC)    Headache    History of left bundle branch block (LBBB)    HTN (hypertension)    Hx of cardiovascular stress test 10/2016   intermediate risk study   Hypothyroidism    Neuromuscular disorder  (HCC)    Neuropathy    Obesity    OSA on CPAP 03/10/2015   Renal insufficiency    Retinopathy    Sleep apnea    Varicose veins of both lower extremities    Past Surgical History:  Procedure Laterality Date   ANAL RECTAL MANOMETRY N/A 01/16/2024   Procedure: MANOMETRY, ANORECTAL;  Surgeon: Nandigam, Kavitha V, MD;  Location: [facility];  Service: Gastroenterology;  Laterality: N/A;   BIOPSY  11/14/2016   Procedure: BIOPSY;  Surgeon: Margo LITTIE Haddock, MD;  Location: [facility];  Service: Endoscopy;;  colon gastric duodenal   BIOPSY  03/19/2023   Procedure: BIOPSY;  Surgeon: Cindie Carlin POUR, DO;  Location: [facility];  Service: Endoscopy;;   BREAST EXCISIONAL BIOPSY Right    BREAST SURGERY Left 1994   Lumpectomy   COLONOSCOPY WITH PROPOFOL  N/A 11/14/2016   Dr. haddock: Moderately redundant rectosigmoid colon. Random colon biopsies benign. Internal hemorrhoids. Surveillance colonoscopy in 5 years.   COLONOSCOPY WITH PROPOFOL  N/A 04/20/2022   Procedure: COLONOSCOPY WITH PROPOFOL ;  Surgeon: Cindie Carlin POUR, DO;  Location: [facility];  Service: Endoscopy;  Laterality: N/A;  12:30am   CORONARY PRESSURE/FFR STUDY N/A 06/19/2023   Procedure: CORONARY PRESSURE/FFR STUDY;  Surgeon: Wendel Lurena POUR, MD;  Location: [facility];  Service: Cardiovascular;  Laterality: N/A;   CORONARY STENT INTERVENTION N/A 03/22/2018   Procedure: CORONARY STENT INTERVENTION;  Surgeon: Verlin Lonni BIRCH, MD;  Location: [facility];  Service: Cardiovascular;  Laterality: N/A;   ESOPHAGOGASTRODUODENOSCOPY (EGD) WITH PROPOFOL  N/A 11/14/2016   Dr. haddock: Esophagus normal. Moderate gastritis, few gastric polyps. Duodenal biopsies negative. Fundic gland polyp gastric polyp area no H pylori.   ESOPHAGOGASTRODUODENOSCOPY (EGD) WITH PROPOFOL  N/A 03/19/2023   Procedure: ESOPHAGOGASTRODUODENOSCOPY (EGD) WITH PROPOFOL ;  Surgeon: Cindie Carlin POUR, DO;  [facility]: [facility];  Service: Endoscopy;   Laterality: N/A;  10:15 am, asa 3   FOOT SURGERY     LEFT HEART CATH AND CORONARY ANGIOGRAPHY N/A 03/22/2018   Procedure: LEFT HEART CATH AND CORONARY ANGIOGRAPHY;  Surgeon: Verlin Lonni BIRCH, MD;  Location: [facility];  Service: Cardiovascular;  Laterality: N/A;   LEFT HEART CATH AND CORONARY ANGIOGRAPHY N/A 06/19/2023   Procedure: LEFT HEART CATH AND CORONARY ANGIOGRAPHY;  Surgeon: Wendel Lurena POUR, MD;  Location: [facility];  Service: Cardiovascular;  Laterality: N/A;   LEG SURGERY Right    POLYPECTOMY  04/20/2022   Procedure: POLYPECTOMY;  Surgeon: Cindie Carlin POUR, DO;  Location: [facility];  Service: Endoscopy;;   REPLACEMENT TOTAL KNEE Right    SPINAL CORD STIMULATOR IMPLANT     FAMILY HISTORY Family History  Problem Relation Age of Onset   Cancer Mother 58  colon   Cancer Father        renal cell   Colon cancer Father    Breast cancer Sister    Cancer Sister        primary brain, and primary breast too   Breast cancer Sister    Cancer Sister        breast    Breast cancer Sister    Breast cancer Sister    Cancer Brother        kidney and liver   Sleep apnea Neg Hx    SOCIAL HISTORY Social History   Tobacco Use   Smoking status: Never   Smokeless tobacco: Never  Vaping Use   Vaping status: Never Used  Substance Use Topics   Alcohol  use: No    Alcohol /week: 0.0 standard drinks of alcohol    Drug use: No       OPHTHALMIC EXAM:  Base Eye Exam     Visual Acuity (Snellen - Linear)       Right Left   Dist Wise 20/20    Dist cc  20/25 -1         Tonometry (Tonopen, 7:43 AM)       Right Left   Pressure 14 18         Pupils       Dark Light Shape React APD   Right 3 2 Round Brisk None   Left 3 2 Round Brisk None         Visual Fields       Left Right    Full Full         Extraocular Movement       Right Left    Full, Ortho Full, Ortho         Neuro/Psych     Oriented x3: Yes   Mood/Affect: Normal          Dilation     Both eyes: 1.0% Mydriacyl, 2.5% Phenylephrine  @ 7:41 AM           Slit Lamp and Fundus Exam     Slit Lamp Exam       Right Left   Lids/Lashes Dermatochalasis - upper lid, mild MGD Dermatochalasis - upper lid, mild MGD   Conjunctiva/Sclera nasal pingeucula nasal pingeucula, Trace Injection   Cornea arcus, mild tear film debris, Well healed temporal cataract wound arcus, 1+ fine Punctate epithelial erosions   Anterior Chamber deep and clear, no cell or flare deep and clear, no cell or flare   Iris Round and moderately dilated, No NVI Round and dilated, No NVI   Lens PC IOL in good position 2-3+ Nuclear sclerosis, 2-3+ Cortical cataract, 1+ Posterior subcapsular cataract   Anterior Vitreous Vitreous syneresis Vitreous syneresis         Fundus Exam       Right Left   Disc Pink and Sharp, no NVD, temporal PPA Pink and Sharp, no NVD, temporal PPP   C/D Ratio 0.4 0.4   Macula Good foveal reflex, +cystic changes, scattered MA -- stably improved Flat, good foveal reflex, scattered MA -- improved, trace cystic changes   Vessels attenuated, Tortuous attenuated, Tortuous, +NV -- regressing   Periphery Attached, dense 360 PRP extending posteriorly to arcades Attached, 360 PRP with good posterior fill in           IMAGING AND PROCEDURES  Imaging and Procedures for 08/07/2024  OCT, Retina - OU - Both Eyes       Right  Eye Quality was good. Central Foveal Thickness: 325. Progression has worsened. Findings include normal foveal contour, no SRF, intraretinal fluid (Stable improvement in cystic changes centrally and inferior macula; interval development of cystic changes superior fovea).   Left Eye Quality was good. Central Foveal Thickness: 328. Progression has been stable. Findings include normal foveal contour, no SRF, epiretinal membrane, intraretinal fluid, macular pucker (trace cystic changes perifovea; mild ERM and pucker nasal macula).   Notes *Images  captured and stored on drive  Diagnosis / Impression:  +DME OU OD: Stable improvement in cystic changes centrally and inferior macula; interval development of cystic changes superior fovea OS: trace cystic changes perifovea; mild ERM and pucker nasal macula  Clinical management:  See below  Abbreviations: NFP - Normal foveal profile. CME - cystoid macular edema. PED - pigment epithelial detachment. IRF - intraretinal fluid. SRF - subretinal fluid. EZ - ellipsoid zone. ERM - epiretinal membrane. ORA - outer retinal atrophy. ORT - outer retinal tubulation. SRHM - subretinal hyper-reflective material. IRHM - intraretinal hyper-reflective material      Intravitreal Injection, Pharmacologic Agent - OD - Right Eye       Time Out 08/07/2024. 8:18 AM. Confirmed correct patient, procedure, site, and patient consented.   Anesthesia Topical anesthesia was used. Anesthetic medications included Lidocaine  2%, Proparacaine 0.5%.   Procedure Preparation included 5% betadine to ocular surface, eyelid speculum. A (32g) needle was used.   Injection: 2 mg aflibercept  2 MG/0.05ML   Route: Intravitreal, Site: Right Eye   NDC: [id number], Lot: [id number], Expiration date: 08/30/2025, Waste: 0 mL   Post-op Post injection exam found visual acuity of at least counting fingers. The patient tolerated the procedure well. There were no complications. The patient received written and verbal post procedure care education.            ASSESSMENT/PLAN:    ICD-10-CM   1. Proliferative diabetic retinopathy of both eyes with macular edema associated with type 2 diabetes mellitus (HCC)  E11.3513 OCT, Retina - OU - Both Eyes    Intravitreal Injection, Pharmacologic Agent - OD - Right Eye    aflibercept  (EYLEA ) SOLN 2 mg    2. Long-term (current) use of injectable non-insulin  antidiabetic drugs  Z79.85     3. Long term (current) use of oral hypoglycemic drugs  Z79.84     4. Current use of insulin  (HCC)   Z79.4     5. Essential hypertension  I10     6. Hypertensive retinopathy of both eyes  H35.033     7. Pseudophakia of right eye  Z96.1     8. Combined forms of age-related cataract of left eye  H25.812      1-4. Proliferative diabetic retinopathy OU - previously managed by Dr. Waylan -- lost to f/u since 2020 - s/p PRP OU and IVA OS x7 (BB) - s/p IVA OD #1 (01.02.24), #2 (02.02.24), #3 (03.01.24), #4(04.05.24), #5 (10.21.24) -- IVA resistance - s/p IVA OS #1 (01.04.24), #2 (02.02.24), #3 (03.01.24), #4(04.05.24), #5 (05.03.24), #6 (06.03.24), #7 (07.01.24), #8 (08.05.24), #9 (09.09.24) - IVA resistance ================================= - s/p IVE OD #1 (05.03.24), #2 (06.03.24), #3 (07.01.24), #4 (08.05.24), #5 (09.09.24), #6 (12.10.24), #7 (01.13.25), #8 (02.24.25), #9 (04.07.25), #10 (06.02.25), #11 (07.29.25), #12 (09.17.25), #13 (11.20.25)  - s/p IVE OS #1 (10.21.24), #2 (12.10.24), #3 (04.07.25) - s/p PRP fill in OS (01.23.24) - exam shows scattered MA OU; laser PRP OU - FA (11.20.25) shows OD: Cluster of leakage temporal macula--improved, now just staining, no NV;  OS: Interval improvement in scattered focal NVE temporal hemisphere--decreased leakage  - FA (01.02.23) shows OD: Cluster of leakage temporal macula, no NV; OS: Scattered focal NVE greatest inferior and temporal quads -- will need PRP fill in OS -- completed Jan 2024 - BCVA OD 20/20 from 20/25; OS 20/25 - stable - OCT shows OD: Stable improvement in cystic changes centrally and inferior macula; interval development of cystic changes superior fovea; OS: trace cystic changes perifovea; mild ERM and pucker nasal macula at 7 weeks - recommend IVE today OD #14 (01.08.25) w/ f/u in 7 wks (pt having cataract sx Jan) - treating OS PRN -- will hold tx in OS today -- pt in agreement - pt wishes to proceed with IVE OD - RBA of procedure discussed, questions answered - see procedure note - IVE informed consent obtained and signed,  05.03.24 (OU) - f/u 7 weeks -- DFE, OCT, possible injections  5,6. Hypertensive retinopathy OU - discussed importance of tight BP control  - monitor  7. Mixed Cataract OS - The symptoms of cataract, surgical options, and treatments and risks were discussed with patient. - discussed diagnosis and progression - Pt scheduled for cataract sx w/ Dr. Lavonia OS 01.14.25  8. Pseudophakia OD  - s/p CE/IOL 12.10.25 w/ Dr. Lavonia  - IOL in good position, doing well  - monitor   Ophthalmic Meds Ordered this visit:  Meds ordered this encounter  Medications   aflibercept  (EYLEA ) SOLN 2 mg     Return in about 7 weeks (around 09/25/2024) for PDR OU, DFE, OCT, likely IVE OD.  There are no Patient Instructions on file for this visit.  This document serves as a record of services personally performed by Redell JUDITHANN Hans, MD, PhD. It was created on their behalf by Avelina Pereyra, COA an ophthalmic technician. The creation of this record is the provider's dictation and/or activities during the visit.   Electronically signed by: Avelina GORMAN Pereyra, COT  08/17/24  1:22 PM    This document serves as a record of services personally performed by Redell JUDITHANN Hans, MD, PhD. It was created on their behalf by Almetta Pesa, an ophthalmic technician. The creation of this record is the provider's dictation and/or activities during the visit.    Electronically signed by: Almetta Pesa, OA, 08/17/24  1:22 PM  Redell JUDITHANN Hans, M.D., Ph.D. Diseases & Surgery of the Retina and Vitreous [facility]  I have reviewed the above documentation for accuracy and completeness, and I agree with the above. Redell JUDITHANN Hans, M.D., Ph.D. 08/17/24 1:23 PM   Abbreviations: M myopia (nearsighted); A astigmatism; H hyperopia (farsighted); P presbyopia; Mrx spectacle prescription;  CTL contact lenses; OD right eye; OS left eye; OU both eyes  XT exotropia; ET esotropia; PEK punctate epithelial keratitis; PEE  punctate epithelial erosions; DES dry eye syndrome; MGD meibomian gland dysfunction; ATs artificial tears; PFAT's preservative free artificial tears; NSC nuclear sclerotic cataract; PSC posterior subcapsular cataract; ERM epi-retinal membrane; PVD posterior vitreous detachment; RD retinal detachment; DM diabetes mellitus; DR diabetic retinopathy; NPDR non-proliferative diabetic retinopathy; PDR proliferative diabetic retinopathy; CSME clinically significant macular edema; DME diabetic macular edema; dbh dot blot hemorrhages; CWS cotton wool spot; POAG primary open angle glaucoma; C/D cup-to-disc ratio; HVF humphrey visual field; GVF goldmann visual field; OCT optical coherence tomography; IOP intraocular pressure; BRVO Branch retinal vein occlusion; CRVO central retinal vein occlusion; CRAO central retinal artery occlusion; BRAO branch retinal artery occlusion; RT retinal tear; SB scleral buckle;  PPV pars plana vitrectomy; VH Vitreous hemorrhage; PRP panretinal laser photocoagulation; IVK intravitreal kenalog ; VMT vitreomacular traction; MH Macular hole;  NVD neovascularization of the disc; NVE neovascularization elsewhere; AREDS age related eye disease study; ARMD age related macular degeneration; POAG primary open angle glaucoma; EBMD epithelial/anterior basement membrane dystrophy; ACIOL anterior chamber intraocular lens; IOL intraocular lens; PCIOL posterior chamber intraocular lens; Phaco/IOL phacoemulsification with intraocular lens placement; PRK photorefractive keratectomy; LASIK laser assisted in situ keratomileusis; HTN hypertension; DM diabetes mellitus; COPD chronic obstructive pulmonary disease "

## 2024-07-30 ENCOUNTER — Encounter: Payer: Self-pay | Admitting: Internal Medicine

## 2024-08-01 ENCOUNTER — Ambulatory Visit (INDEPENDENT_AMBULATORY_CARE_PROVIDER_SITE_OTHER): Admitting: Otolaryngology

## 2024-08-06 ENCOUNTER — Ambulatory Visit

## 2024-08-06 DIAGNOSIS — J302 Other seasonal allergic rhinitis: Secondary | ICD-10-CM

## 2024-08-07 ENCOUNTER — Ambulatory Visit (INDEPENDENT_AMBULATORY_CARE_PROVIDER_SITE_OTHER): Admitting: Ophthalmology

## 2024-08-07 ENCOUNTER — Encounter (INDEPENDENT_AMBULATORY_CARE_PROVIDER_SITE_OTHER): Payer: Self-pay | Admitting: Ophthalmology

## 2024-08-07 DIAGNOSIS — E113513 Type 2 diabetes mellitus with proliferative diabetic retinopathy with macular edema, bilateral: Secondary | ICD-10-CM

## 2024-08-07 DIAGNOSIS — Z7984 Long term (current) use of oral hypoglycemic drugs: Secondary | ICD-10-CM

## 2024-08-07 DIAGNOSIS — H25812 Combined forms of age-related cataract, left eye: Secondary | ICD-10-CM | POA: Diagnosis not present

## 2024-08-07 DIAGNOSIS — I10 Essential (primary) hypertension: Secondary | ICD-10-CM | POA: Diagnosis not present

## 2024-08-07 DIAGNOSIS — Z794 Long term (current) use of insulin: Secondary | ICD-10-CM

## 2024-08-07 DIAGNOSIS — H35033 Hypertensive retinopathy, bilateral: Secondary | ICD-10-CM | POA: Diagnosis not present

## 2024-08-07 DIAGNOSIS — Z961 Presence of intraocular lens: Secondary | ICD-10-CM | POA: Diagnosis not present

## 2024-08-07 DIAGNOSIS — H25813 Combined forms of age-related cataract, bilateral: Secondary | ICD-10-CM

## 2024-08-07 DIAGNOSIS — Z7985 Long-term (current) use of injectable non-insulin antidiabetic drugs: Secondary | ICD-10-CM

## 2024-08-07 MED ORDER — AFLIBERCEPT 2MG/0.05ML IZ SOLN FOR KALEIDOSCOPE
2.0000 mg | INTRAVITREAL | Status: AC | PRN
  Administered 2024-08-07: 2 mg via INTRAVITREAL

## 2024-08-10 ENCOUNTER — Encounter: Payer: Self-pay | Admitting: Internal Medicine

## 2024-08-11 DIAGNOSIS — J301 Allergic rhinitis due to pollen: Secondary | ICD-10-CM | POA: Diagnosis not present

## 2024-08-11 DIAGNOSIS — J3081 Allergic rhinitis due to animal (cat) (dog) hair and dander: Secondary | ICD-10-CM | POA: Diagnosis not present

## 2024-08-11 NOTE — Progress Notes (Signed)
 VIALS MADE ON 08/11/24

## 2024-08-12 ENCOUNTER — Other Ambulatory Visit (HOSPITAL_COMMUNITY)
Admission: RE | Admit: 2024-08-12 | Discharge: 2024-08-12 | Disposition: A | Discharge status: Home / Self Care | Source: Ambulatory Visit | Attending: Nephrology | Admitting: Nephrology

## 2024-08-12 DIAGNOSIS — J3089 Other allergic rhinitis: Secondary | ICD-10-CM | POA: Diagnosis not present

## 2024-08-12 DIAGNOSIS — R809 Proteinuria, unspecified: Secondary | ICD-10-CM | POA: Diagnosis not present

## 2024-08-12 DIAGNOSIS — N189 Chronic kidney disease, unspecified: Secondary | ICD-10-CM | POA: Diagnosis present

## 2024-08-12 DIAGNOSIS — D631 Anemia in chronic kidney disease: Secondary | ICD-10-CM | POA: Diagnosis not present

## 2024-08-12 DIAGNOSIS — M109 Gout, unspecified: Secondary | ICD-10-CM | POA: Insufficient documentation

## 2024-08-12 DIAGNOSIS — E211 Secondary hyperparathyroidism, not elsewhere classified: Secondary | ICD-10-CM | POA: Diagnosis not present

## 2024-08-12 DIAGNOSIS — J302 Other seasonal allergic rhinitis: Secondary | ICD-10-CM | POA: Diagnosis not present

## 2024-08-12 LAB — CBC
HCT: 38.8 % (ref 36.0–46.0)
Hemoglobin: 12.1 g/dL (ref 12.0–15.0)
MCH: 25.9 pg — ABNORMAL LOW (ref 26.0–34.0)
MCHC: 31.2 g/dL (ref 30.0–36.0)
MCV: 82.9 fL (ref 80.0–100.0)
Platelets: 274 K/uL (ref 150–400)
RBC: 4.68 MIL/uL (ref 3.87–5.11)
RDW: 17.1 % — ABNORMAL HIGH (ref 11.5–15.5)
WBC: 5.3 K/uL (ref 4.0–10.5)
nRBC: 0 % (ref 0.0–0.2)

## 2024-08-12 LAB — RENAL FUNCTION PANEL
Albumin: 4.4 g/dL (ref 3.5–5.0)
Anion gap: 14 (ref 5–15)
BUN: 34 mg/dL — ABNORMAL HIGH (ref 8–23)
CO2: 29 mmol/L (ref 22–32)
Calcium: 9.8 mg/dL (ref 8.9–10.3)
Chloride: 99 mmol/L (ref 98–111)
Creatinine, Ser: 1.45 mg/dL — ABNORMAL HIGH (ref 0.44–1.00)
GFR, Estimated: 40 mL/min — ABNORMAL LOW
Glucose, Bld: 54 mg/dL — ABNORMAL LOW (ref 70–99)
Phosphorus: 3.6 mg/dL (ref 2.5–4.6)
Potassium: 4.2 mmol/L (ref 3.5–5.1)
Sodium: 142 mmol/L (ref 135–145)

## 2024-08-12 LAB — PROTEIN / CREATININE RATIO, URINE
Creatinine, Urine: 141 mg/dL
Protein Creatinine Ratio: 0.1 mg/mg
Total Protein, Urine: 16 mg/dL

## 2024-08-12 LAB — URIC ACID: Uric Acid, Serum: 8.6 mg/dL — ABNORMAL HIGH (ref 2.5–7.1)

## 2024-08-12 NOTE — Telephone Encounter (Signed)
 Patient is on recall for 04/2027

## 2024-08-29 ENCOUNTER — Ambulatory Visit (INDEPENDENT_AMBULATORY_CARE_PROVIDER_SITE_OTHER): Admitting: Otolaryngology

## 2024-09-03 NOTE — Progress Notes (Unsigned)
 "  Cardiology Office Note    Date:  09/04/2024  ID:  Jodi, Ray Dec 13, 1960, MRN [id number] Cardiologist: Alvan Carrier, MD { : History of Present Illness:    Jodi Ray is a 64 y.o. female with past medical history of CAD (s/p DES placement to the proximal LAD in 02/2018, NST in 01/2022 showing no ischemia, cath in 06/2023 showing patent LAD stent with only mild 10% ISR and mild disease of the RCA and was noted to have evidence of coronary microvascular dysfunction with CFR less than 2), HTN, HLD, Type I DM, Stage III CKD, chronic HFpEF, OSA, palpitations (PAC's, PVC's and bigeminy by prior monitor) and chronic LBBB presents to the office today for 81-month follow-up.  She was last examined by myself in 12/2023 and Coreg  had recently been titrated to 12.5 mg twice daily and she reported significant improvement in her palpitations. She was participating in maintenance cardiac rehab classes and denied any anginal symptoms. No changes were made to her cardiac medications and she was continued on ASA 81 mg daily, Atorvastatin  80 mg daily, Coreg  12.5 mg twice daily, Jardiance 5 mg MWF, Lasix  40 mg twice daily, Hydralazine  25 mg 3 times daily and Verapamil  80 mg 3 times daily (prescribed for cluster headaches as well).   In talking with the patient today, she reports overall doing well from a cardiac perspective since her last visit. She is still participating in maintenance cardiac rehab and reports overall feeling well with this. She denies any chest pain or progressive dyspnea on exertion when exercising. Does report feeling fatigued afterwards but this has overall been stable. Feels that the current dose of Coreg  is significantly helping with her palpitations with no recent symptoms. Says that Nephrology did switch to Torsemide in the interim and stopped Kerendia. She has been on Jardiance per Nephrology and Endocrinology despite having Type I DM. She is now being followed by  Endocrinology at [facility] as well given her variable glucose readings.  Studies Reviewed:   EKG: EKG is ordered today and demonstrates:   EKG Interpretation Date/Time:  Thursday September 04 2024 08:10:59 EST Ventricular Rate:  67 PR Interval:  192 QRS Duration:  152 QT Interval:  458 QTC Calculation: 483 R Axis:   66  Text Interpretation: Normal sinus rhythm Left bundle branch block When compared with ECG of 23-Jul-2023 13:24, QRS duration has increased Confirmed by Johnson Grate ([id number]) on 09/04/2024 8:12:48 AM       Cardiac Catheterization: 06/2023   Prox LAD lesion is 10% stenosed.   1.  Occluded right radial necessitating right ulnar approach. 2.  Patent proximal LAD stent with mild in-stent restenosis and mild disease of the right coronary artery. 3.  LVEDP of 18 mmHg. 4.  Evidence of coronary microvascular dysfunction with a CFR of less than 2.  The index of microvascular resistance is negative at 20.   Recommendation: Given evidence of coronary microvascular dysfunction would avoid nitrate containing compounds and consider the  ACE inhibitors, ARBs, statins, calcium  channel blockers, beta-blockers, ranolazine , and ivadrabine.  Echocardiogram: 10/2023 IMPRESSIONS     1. Left ventricular ejection fraction, by estimation, is 55 to 60%. The  left ventricle has normal function. The left ventricle has no regional  wall motion abnormalities. Left ventricular diastolic parameters are  consistent with Grade I diastolic  dysfunction (impaired relaxation).   2. Right ventricular systolic function is normal. The right ventricular  size is normal.   3. Left atrial size was mildly dilated.  4. The mitral valve is normal in structure. No evidence of mitral valve  regurgitation. No evidence of mitral stenosis. Moderate mitral annular  calcification.   5. The aortic valve is normal in structure. Aortic valve regurgitation is  not visualized. No aortic stenosis is present.   6. The  inferior vena cava is normal in size with greater than 50%  respiratory variability, suggesting right atrial pressure of 3 mmHg.    Physical Exam:   VS:  BP 128/82   Pulse 67   Ht 5' 8 (1.727 m)   Wt 268 lb (121.6 kg)   BMI 40.75 kg/m    Wt Readings from Last 3 Encounters:  09/04/24 268 lb (121.6 kg)  05/28/24 263 lb 1.6 oz (119.3 kg)  04/07/24 263 lb (119.3 kg)     GEN: Pleasant female appearing in no acute distress NECK: No JVD; No carotid bruits CARDIAC: RRR, no murmurs, rubs, gallops RESPIRATORY:  Clear to auscultation without rales, wheezing or rhonchi  ABDOMEN: Appears non-distended. No obvious abdominal masses. EXTREMITIES: No clubbing or cyanosis. Trace lower extremity edema bilaterally. Varicose veins noted and compression stockings in place. Distal pedal pulses are 2+ bilaterally.   Assessment and Plan:   1. Coronary artery disease involving native coronary artery of native heart without angina pectoris - She is s/p DES placement to the proximal LAD in 02/2018 and cath in 06/2023 showed a patent LAD stent with only mild 10% ISR and mild disease of the RCA and was noted to have evidence of coronary microvascular dysfunction with CFR less than 2. - She denies any recent anginal symptoms and continues to participate in maintenance cardiac rehab.  Continue current medical therapy with ASA 81 mg daily, Atorvastatin  80 mg daily, Coreg  12.5 mg twice daily, Spironolactone 12.5 mg daily and Verapamil  80 mg 3 times daily.  2. Essential hypertension - Her blood pressure was initially at 136/60, rechecked and at 128/62. Continue current medical therapy with Coreg  12.5 mg twice daily, Spironolactone 12.5 mg daily and Verapamil  80 mg 3 times daily.  3. Mixed hyperlipidemia - Her LDL was previously at 61 in 07/2023 but she has not had a repeat FLP in the interim. Will provide with a lab slip for this to be obtained with her next set of fasting labs. Continue current medical therapy  with Atorvastatin  80 mg daily.  4. Palpitations - She reports her palpitations have overall been well-controlled. Continue Coreg  12.5 mg twice daily and Verapamil  80 mg TID (on this for cluster headaches). I encouraged her to make us  aware if her fatigue were to increase in severity as this may need to be reduced.  5. Chronic heart failure with preserved ejection fraction (HFpEF) (HCC) - Most recent echocardiogram in 10/2023 showed a preserved EF of 55 to 60% with Grade 1 DD. She does have trace edema on examination but no pitting and lungs are clear. - Diuretic therapy has been deferred to Nephrology and she remains on Torsemide 100 mg daily along with Spironolactone 12.5 mg daily. She is also on Jardiance 5mg  MWF and would defer continuation of this to Nephrology and Endocrinology given her CKD and Type 1 DM.  6. Stage 3a chronic kidney disease (HCC) - Followed by Dr. Rachele. Creatinine was stable at 1.45 by most recent check on 08/12/2024.  Signed, Laymon CHRISTELLA Qua, PA-C   "

## 2024-09-04 ENCOUNTER — Ambulatory Visit: Admitting: Student

## 2024-09-04 ENCOUNTER — Encounter: Payer: Self-pay | Admitting: Student

## 2024-09-04 VITALS — BP 128/82 | HR 67 | Ht 68.0 in | Wt 268.0 lb

## 2024-09-04 DIAGNOSIS — R002 Palpitations: Secondary | ICD-10-CM

## 2024-09-04 DIAGNOSIS — E782 Mixed hyperlipidemia: Secondary | ICD-10-CM | POA: Diagnosis not present

## 2024-09-04 DIAGNOSIS — N1831 Chronic kidney disease, stage 3a: Secondary | ICD-10-CM | POA: Diagnosis not present

## 2024-09-04 DIAGNOSIS — I2510 Atherosclerotic heart disease of native coronary artery without angina pectoris: Secondary | ICD-10-CM

## 2024-09-04 DIAGNOSIS — I5032 Chronic diastolic (congestive) heart failure: Secondary | ICD-10-CM

## 2024-09-04 DIAGNOSIS — I10 Essential (primary) hypertension: Secondary | ICD-10-CM | POA: Diagnosis not present

## 2024-09-04 MED ORDER — ATORVASTATIN CALCIUM 80 MG PO TABS: ORAL_TABLET | ORAL | 3 refills | Status: AC

## 2024-09-04 MED ORDER — CARVEDILOL 12.5 MG PO TABS: 12.5000 mg | ORAL_TABLET | Freq: Two times a day (BID) | ORAL | 3 refills | Status: AC

## 2024-09-04 NOTE — Patient Instructions (Signed)
 Medication Instructions:   Continue current medication regimen.   *If you need a refill on your cardiac medications before your next appointment, please call your pharmacy*  Lab Work:  Lipid Panel with next set of labs.   If you have labs (blood work) drawn today and your tests are completely normal, you will receive your results only by: MyChart Message (if you have MyChart) OR A paper copy in the mail If you have any lab test that is abnormal or we need to change your treatment, we will call you to review the results.  Follow-Up: At [facility], you and your health needs are our priority.  As part of our continuing mission to provide you with exceptional heart care, our providers are all part of one team.  This team includes your primary Cardiologist (physician) and Advanced Practice Providers or APPs (Physician Assistants and Nurse Practitioners) who all work together to provide you with the care you need, when you need it.  Your next appointment:   6 month(s)  Provider:   You may see Alvan Carrier, MD or one of the following Advanced Practice Providers on your designated Care Team:   Laymon Qua, PA-C  [place], [place] Olivia [place]avy, [place]     We recommend signing up for the patient portal called MyChart.  Sign up information is provided on this After Visit Summary.  MyChart is used to connect with patients for Virtual Visits (Telemedicine).  Patients are able to view lab/test results, encounter notes, upcoming appointments, etc.  Non-urgent messages can be sent to your provider as well.   To learn more about what you can do with MyChart, go to [email or web address].

## 2024-09-05 ENCOUNTER — Ambulatory Visit

## 2024-09-05 DIAGNOSIS — J302 Other seasonal allergic rhinitis: Secondary | ICD-10-CM

## 2024-09-17 ENCOUNTER — Ambulatory Visit (INDEPENDENT_AMBULATORY_CARE_PROVIDER_SITE_OTHER): Admitting: Otolaryngology

## 2024-09-25 ENCOUNTER — Encounter (INDEPENDENT_AMBULATORY_CARE_PROVIDER_SITE_OTHER): Admitting: Ophthalmology

## 2024-10-21 ENCOUNTER — Ambulatory Visit: Admitting: Neurology

## 2024-12-16 ENCOUNTER — Telehealth: Admitting: Adult Health

## 2024-12-16 ENCOUNTER — Ambulatory Visit: Admitting: Adult Health
